# Patient Record
Sex: Male | Born: 1980 | Race: Black or African American | Hispanic: No | Marital: Single | Smoking: Current some day smoker
Health system: Southern US, Community
[De-identification: ages and names within clinical notes are randomized; demographics above are authoritative.]

## PROBLEM LIST (undated history)

## (undated) ENCOUNTER — Emergency Department (HOSPITAL_COMMUNITY): Discharge status: Absent without leave | Payer: Medicaid Other

## (undated) DIAGNOSIS — G4730 Sleep apnea, unspecified: Secondary | ICD-10-CM

## (undated) DIAGNOSIS — E119 Type 2 diabetes mellitus without complications: Secondary | ICD-10-CM

## (undated) DIAGNOSIS — J449 Chronic obstructive pulmonary disease, unspecified: Secondary | ICD-10-CM

## (undated) DIAGNOSIS — F29 Unspecified psychosis not due to a substance or known physiological condition: Secondary | ICD-10-CM

## (undated) DIAGNOSIS — S75009A Unspecified injury of femoral artery, unspecified leg, initial encounter: Secondary | ICD-10-CM

## (undated) DIAGNOSIS — J45909 Unspecified asthma, uncomplicated: Secondary | ICD-10-CM

## (undated) DIAGNOSIS — W3400XA Accidental discharge from unspecified firearms or gun, initial encounter: Secondary | ICD-10-CM

## (undated) DIAGNOSIS — Y249XXA Unspecified firearm discharge, undetermined intent, initial encounter: Secondary | ICD-10-CM

## (undated) DIAGNOSIS — F419 Anxiety disorder, unspecified: Secondary | ICD-10-CM

## (undated) DIAGNOSIS — F819 Developmental disorder of scholastic skills, unspecified: Secondary | ICD-10-CM

## (undated) DIAGNOSIS — J189 Pneumonia, unspecified organism: Secondary | ICD-10-CM

## (undated) DIAGNOSIS — F329 Major depressive disorder, single episode, unspecified: Secondary | ICD-10-CM

## (undated) DIAGNOSIS — Z8669 Personal history of other diseases of the nervous system and sense organs: Secondary | ICD-10-CM

## (undated) DIAGNOSIS — F32A Depression, unspecified: Secondary | ICD-10-CM

## (undated) HISTORY — DX: Type 2 diabetes mellitus without complications: E11.9

## (undated) HISTORY — DX: Developmental disorder of scholastic skills, unspecified: F81.9

## (undated) HISTORY — DX: Morbid (severe) obesity due to excess calories: E66.01

## (undated) HISTORY — DX: Unspecified psychosis not due to a substance or known physiological condition: F29

## (undated) HISTORY — DX: Personal history of other diseases of the nervous system and sense organs: Z86.69

---
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
## 2005-12-18 ENCOUNTER — Emergency Department (HOSPITAL_COMMUNITY): Admission: EM | Admit: 2005-12-18 | Discharge: 2005-12-18 | Payer: Self-pay | Admitting: Emergency Medicine

## 2007-08-14 ENCOUNTER — Encounter: Payer: Self-pay | Admitting: Family Medicine

## 2007-08-22 DIAGNOSIS — F819 Developmental disorder of scholastic skills, unspecified: Secondary | ICD-10-CM

## 2007-08-22 HISTORY — DX: Developmental disorder of scholastic skills, unspecified: F81.9

## 2007-10-23 DIAGNOSIS — F29 Unspecified psychosis not due to a substance or known physiological condition: Secondary | ICD-10-CM

## 2007-10-23 HISTORY — DX: Unspecified psychosis not due to a substance or known physiological condition: F29

## 2007-11-15 ENCOUNTER — Encounter: Payer: Self-pay | Admitting: Family Medicine

## 2008-04-22 ENCOUNTER — Encounter: Payer: Self-pay | Admitting: Family Medicine

## 2008-09-08 ENCOUNTER — Ambulatory Visit: Payer: Self-pay | Admitting: Family Medicine

## 2008-09-08 DIAGNOSIS — F29 Unspecified psychosis not due to a substance or known physiological condition: Secondary | ICD-10-CM | POA: Insufficient documentation

## 2008-09-08 DIAGNOSIS — J45991 Cough variant asthma: Secondary | ICD-10-CM | POA: Insufficient documentation

## 2008-09-08 DIAGNOSIS — G4730 Sleep apnea, unspecified: Secondary | ICD-10-CM | POA: Insufficient documentation

## 2008-09-08 DIAGNOSIS — R5381 Other malaise: Secondary | ICD-10-CM | POA: Insufficient documentation

## 2008-09-08 DIAGNOSIS — R5383 Other fatigue: Secondary | ICD-10-CM

## 2008-09-08 DIAGNOSIS — Z6841 Body Mass Index (BMI) 40.0 and over, adult: Secondary | ICD-10-CM

## 2008-09-08 LAB — CONVERTED CEMR LAB
Chlamydia, DNA Probe: NEGATIVE
GC Probe Amp, Genital: NEGATIVE

## 2008-09-09 ENCOUNTER — Telehealth: Payer: Self-pay | Admitting: Family Medicine

## 2008-09-11 LAB — CONVERTED CEMR LAB
BUN: 15 mg/dL (ref 6–23)
Basophils Absolute: 0 10*3/uL (ref 0.0–0.1)
Basophils Relative: 1 % (ref 0–1)
CO2: 20 meq/L (ref 19–32)
Calcium: 9.1 mg/dL (ref 8.4–10.5)
Chloride: 105 meq/L (ref 96–112)
Cholesterol: 241 mg/dL — ABNORMAL HIGH (ref 0–200)
Creatinine, Ser: 0.88 mg/dL (ref 0.40–1.50)
Eosinophils Absolute: 0.4 10*3/uL (ref 0.0–0.7)
Eosinophils Relative: 8 % — ABNORMAL HIGH (ref 0–5)
Glucose, Bld: 86 mg/dL (ref 70–99)
HCT: 40.9 % (ref 39.0–52.0)
HDL: 32 mg/dL — ABNORMAL LOW (ref >39)
Hemoglobin: 14.2 g/dL (ref 13.0–17.0)
LDL Cholesterol: 152 mg/dL — ABNORMAL HIGH (ref 0–99)
Lymphocytes Relative: 47 % — ABNORMAL HIGH (ref 12–46)
Lymphs Abs: 2.3 10*3/uL (ref 0.7–4.0)
MCHC: 34.7 g/dL (ref 30.0–36.0)
MCV: 87.4 fL (ref 78.0–100.0)
Monocytes Absolute: 0.5 10*3/uL (ref 0.1–1.0)
Monocytes Relative: 11 % (ref 3–12)
Neutro Abs: 1.6 10*3/uL — ABNORMAL LOW (ref 1.7–7.7)
Neutrophils Relative %: 34 % — ABNORMAL LOW (ref 43–77)
PSA: 0.64 ng/mL (ref 0.10–4.00)
Platelets: 329 10*3/uL (ref 150–400)
Potassium: 4 meq/L (ref 3.5–5.3)
RBC: 4.68 M/uL (ref 4.22–5.81)
RDW: 15 % (ref 11.5–15.5)
Sodium: 143 meq/L (ref 135–145)
TSH: 1.937 microintl units/mL (ref 0.350–4.500)
Total CHOL/HDL Ratio: 7.5
Triglycerides: 283 mg/dL — ABNORMAL HIGH (ref <150)
VLDL: 57 mg/dL — ABNORMAL HIGH (ref 0–40)
WBC: 4.8 10*3/uL (ref 4.0–10.5)

## 2008-09-21 DIAGNOSIS — F17200 Nicotine dependence, unspecified, uncomplicated: Secondary | ICD-10-CM | POA: Insufficient documentation

## 2008-09-22 ENCOUNTER — Encounter: Payer: Self-pay | Admitting: Family Medicine

## 2008-09-25 ENCOUNTER — Encounter: Payer: Self-pay | Admitting: Family Medicine

## 2008-09-25 ENCOUNTER — Ambulatory Visit (HOSPITAL_COMMUNITY): Admission: RE | Admit: 2008-09-25 | Discharge: 2008-09-25 | Payer: Self-pay | Admitting: Family Medicine

## 2008-09-30 ENCOUNTER — Encounter: Payer: Self-pay | Admitting: Family Medicine

## 2008-10-02 ENCOUNTER — Encounter: Payer: Self-pay | Admitting: Family Medicine

## 2008-10-06 ENCOUNTER — Telehealth: Payer: Self-pay | Admitting: Family Medicine

## 2008-12-31 ENCOUNTER — Ambulatory Visit: Payer: Self-pay | Admitting: Family Medicine

## 2008-12-31 DIAGNOSIS — H547 Unspecified visual loss: Secondary | ICD-10-CM | POA: Insufficient documentation

## 2009-01-28 ENCOUNTER — Encounter: Payer: Self-pay | Admitting: Family Medicine

## 2009-01-30 ENCOUNTER — Telehealth: Payer: Self-pay | Admitting: Family Medicine

## 2009-03-17 ENCOUNTER — Telehealth: Payer: Self-pay | Admitting: Family Medicine

## 2009-03-23 ENCOUNTER — Telehealth: Payer: Self-pay | Admitting: Family Medicine

## 2009-09-16 ENCOUNTER — Ambulatory Visit: Payer: Self-pay | Admitting: Family Medicine

## 2009-09-16 DIAGNOSIS — R110 Nausea: Secondary | ICD-10-CM | POA: Insufficient documentation

## 2009-09-16 DIAGNOSIS — R109 Unspecified abdominal pain: Secondary | ICD-10-CM | POA: Insufficient documentation

## 2009-10-21 ENCOUNTER — Encounter: Payer: Self-pay | Admitting: Family Medicine

## 2009-11-30 ENCOUNTER — Ambulatory Visit: Payer: Self-pay | Admitting: Family Medicine

## 2009-12-01 ENCOUNTER — Encounter: Payer: Self-pay | Admitting: Family Medicine

## 2009-12-01 ENCOUNTER — Telehealth: Payer: Self-pay | Admitting: Family Medicine

## 2009-12-02 LAB — CONVERTED CEMR LAB
Chlamydia, DNA Probe: NEGATIVE
GC Probe Amp, Genital: NEGATIVE

## 2009-12-03 ENCOUNTER — Encounter: Payer: Self-pay | Admitting: Family Medicine

## 2010-01-06 ENCOUNTER — Telehealth: Payer: Self-pay | Admitting: Family Medicine

## 2010-01-19 ENCOUNTER — Ambulatory Visit: Payer: Self-pay | Admitting: Family Medicine

## 2010-02-04 ENCOUNTER — Ambulatory Visit: Payer: Self-pay | Admitting: Family Medicine

## 2010-03-02 ENCOUNTER — Telehealth: Payer: Self-pay | Admitting: Family Medicine

## 2010-03-03 ENCOUNTER — Ambulatory Visit
Admission: RE | Admit: 2010-03-03 | Discharge: 2010-03-03 | Payer: Self-pay | Source: Home / Self Care | Attending: Family Medicine | Admitting: Family Medicine

## 2010-03-23 NOTE — Letter (Signed)
Summary: 1st missed letter  1st missed letter   Imported By: Lind Guest 01/20/2010 07:48:13  _____________________________________________________________________  External Attachment:    Type:   Image     Comment:   External Document

## 2010-03-23 NOTE — Miscellaneous (Signed)
  Clinical Lists Changes  Medications: Added new medication of SYMBICORT 80-4.5 MCG/ACT AERO (BUDESONIDE-FORMOTEROL FUMARATE) 2 puffs twice daily - Signed Added new medication of PROVENTIL HFA 108 (90 BASE) MCG/ACT AERS (ALBUTEROL SULFATE) 2 puffs every 6 to 8 hours as needed for wheezing - Signed Rx of SYMBICORT 80-4.5 MCG/ACT AERO (BUDESONIDE-FORMOTEROL FUMARATE) 2 puffs twice daily;  #1 x 3;  Signed;  Entered by: Syliva Overman MD;  Authorized by: Syliva Overman MD;  Method used: Electronically to [facility] [address], [place], [place], [place]  [address], Ph: [phone], Fax: [phone] Rx of PROVENTIL HFA 108 (90 BASE) MCG/ACT AERS (ALBUTEROL SULFATE) 2 puffs every 6 to 8 hours as needed for wheezing;  #1 x 2;  Signed;  Entered by: Syliva Overman MD;  Authorized by: Syliva Overman MD;  Method used: Electronically to [facility] [address], [place], [place], [place]  [address], Ph: [phone], Fax: [phone]    Prescriptions: PROVENTIL HFA 108 (90 BASE) MCG/ACT AERS (ALBUTEROL SULFATE) 2 puffs every 6 to 8 hours as needed for wheezing  #1 x 2   Entered and Authorized by:   Syliva Overman MD   Signed by:   Syliva Overman MD on 10/02/2008   Method used:   Electronically to        [facility]  [address] (retail)       [address]       [place], [place]  [address]       Ph: [phone]       Fax: [phone]   RxID:   [id number] SYMBICORT 80-4.5 MCG/ACT AERO (BUDESONIDE-FORMOTEROL FUMARATE) 2 puffs twice daily  #1 x 3   Entered and Authorized by:   Syliva Overman MD   Signed by:   Syliva Overman MD on 10/02/2008   Method used:   Electronically to        [facility]  [address] (retail)       [address]       [place], [place]  [address]       Ph: [phone]       Fax: [phone]   RxID:   [id number]

## 2010-03-23 NOTE — Progress Notes (Signed)
Summary: rx  Phone Note Call from Patient   Summary of Call: needs to get a rx called in.  metronidazol 500mg   [phone] Initial call taken by: Rudene Anda,  March 17, 2009 3:42 PM  Follow-up for Phone Call        patient needs appt, no antibiotic without ov Follow-up by: Worthy Keeler LPN,  March 17, 2009 3:45 PM  Additional Follow-up for Phone Call Additional follow up Details #1::        pt was called and notified  Additional Follow-up by: Rudene Anda,  March 17, 2009 3:58 PM

## 2010-03-23 NOTE — Progress Notes (Signed)
  Phone Note Call from Patient   Caller: Mom Summary of Call: patient's mother states he should have been tested for trich and no test was done for that. Initial call taken by: Worthy Keeler LPN,  September 09, 2008 4:29 PM  Follow-up for Phone Call        aqdvise he was treated for trich, and the specimen was sent for the other 2 which we normally do, she did see he hadpossible trich exposure.Nothing else to do.  ALSO in error you ordered a pSA on this pt, pLS be mORE careful, pSA testing starts at age 75 Follow-up by: Syliva Overman MD,  September 09, 2008 6:12 PM  Additional Follow-up for Phone Call Additional follow up Details #1::        mother aware Additional Follow-up by: Worthy Keeler LPN,  September 11, 2008 12:11 PM

## 2010-03-23 NOTE — Progress Notes (Signed)
Summary: needs med list  Phone Note Call from Patient   Summary of Call: wednesday he [facility] to [facility] and needs to show that he has asthma  spray  and  a list of his medicine call back at [phone] needs asap needs to pick up tomorrow Initial call taken by: Lind Guest,  March 23, 2009 3:54 PM  Follow-up for Phone Call        med list available for pick up, mother aware Follow-up by: Worthy Keeler LPN,  March 23, 2009 4:51 PM

## 2010-03-23 NOTE — Assessment & Plan Note (Signed)
Summary: NEW PATIENT   Vital Signs:  Patient profile:   30 year old male Height:      65 inches Weight:      201 pounds BMI:     33.57 O2 Sat:      96 % Pulse rate:   94 / minute Pulse rhythm:   regular Resp:     16 per minute BP sitting:   120 / 82  (left arm) Cuff size:   large  Vitals Entered By: Everitt Amber (September 08, 2008 1:11 PM)  Nutrition Counseling: Patient's BMI is greater than 25 and therefore counseled on weight management options. CC: New patient   CC:  New patient.  History of Present Illness: This is  a new pt eval for this pt with a learning disability and mental health issues  which make him disabled. He is accompanied by his mother. he was recently advised that his partener had trichomonas and he wants to be tested for STD's.He denies urethral discharge. He denies dusuria or frequency, he has had no fever or chills. The pt smokes 1 PPD and drinks alcohol occasionally. He does not exercise, and is overweight. He is not very motivated at this time to make lifestyle changes to improve his general health.He is unwilling to set a quit date for smoking, but is considering decreasing his smoking. He denies head or chest congestion. He denies abdominal pain or probs with bowel movements. He has mild seasonal allergies. Hec/o fatigue and excessive fatigue and daytime sleepiness. He also c/o cough and shortness of breath, and wants to be checked for asthma.  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    Learning disability x childhood  Past Surgical History: Denies surgical history  Family History: Mother-Living-unknown Father-Living-HTN 1 sister-healthy  Social History: Disabled Single Current Smoker-1 pack Alcohol use-yes  1 to 2 beer per week Regular exercise-no Three children age range 1.5 yrs to 7 yrs. Smoking Status:  current Does Patient Exercise:  no  Review of Systems General:  Complains of fatigue and sleep disorder; denies chills and fever; excessive snoring and breath holding. Eyes:  Denies blurring and discharge. ENT:  Denies hoarseness, nasal congestion, sinus pressure, and sore throat. CV:  Denies chest pain or discomfort, difficulty breathing while lying down, palpitations, and swelling of feet. Resp:  Complains of shortness of breath and wheezing; denies cough and sputum productive; wheezing ia approx once per month. GI:  Denies abdominal pain, constipation, diarrhea, nausea, and vomiting. GU:  See HPI; recently told he awas exposed to trichomonas, has had clamydias in the past. MS:  Denies joint pain and stiffness. Derm:  Denies itching and rash. Neuro:  Denies headaches and poor balance. Psych:  Denies anxiety and depression. Endo:  Denies excessive thirst and excessive urination. Heme:  Denies abnormal bruising and bleeding. Allergy:  Complains of seasonal allergies.  Physical Exam  General:  alert, well-hydrated, and overweight-appearing. HEENT: No facial asymmetry,  EOMI, No sinus tenderness, TM's Clear, oropharynx  pink and moist.   Chest: Clear to auscultation bilaterally.  CVS: S1, S2, No murmurs, No S3.   Abd: Soft, Nontender.  MS: Adequate ROM spine, hips, shoulders and knees.  Ext: No edema.   CNS: CN 2-12 intact, power tone and sensation normal throughout.   Skin: Intact, no visible lesions or rashes.  Psych: Good eye contact,  normal affect.  Memory intact, not anxious or depressed appearing. 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 09/11/2008  Problem # 3:  OBESITY, UNSPECIFIED (ICD-278.00) Assessment: Comment Only  Orders: T-Lipid Profile ([id number])  Ht: 65 (09/08/2008)   Wt: 201 (09/08/2008)   BMI: 33.57 (09/08/2008), therapeutic lifestyle change discussed and encouraged  Problem # 4:  SEXUALLY TRANSMITTED DISEASE, EXPOSURE TO (ICD-V01.6) Assessment: Comment Only  Orders: T-Syphilis Test (RPR) [id number]) T-HIV Antibody  (Reflex) [id number]) T-Chlamydia & GC Probe, Genital ([id number])  Problem # 5:  UNSPECIFIED PSYCHOSIS (ICD-298.9) Assessment: Comment Only pt is followed by mental health  Problem # 6:  NICOTINE ADDICTION (ICD-305.1) Assessment: Comment Only  Encouraged smoking cessation and discussed different methods for smoking cessation.   Complete Medication List: 1)  Metronidazole 500 Mg Tabs (Metronidazole) .... Take 1 tablet by mouth two times a day  Other Orders: T-PSA ([id number]) Sleep Disorder Referral (Sleep Disorder)  Patient Instructions: 1)  CPE in 2.5 months. 2)  Tobacco is very bad for your health and your loved ones! You Should stop smoking!. 3)  Stop Smoking Tips: Choose a Quit date. Cut down before the Quit date. decide what you will do as a substitute when you feel the urge to smoke(gum,toothpick,exercise). 4)  It is important that you exercise regularly at least 20 minutes 6 times a week. If you develop chest pain, have severe difficulty breathing, or feel very tired , stop exercising  immediately and seek medical attention. 5)  You need to lose weight. Consider a lower calorie diet and regular exercise. 6)  You will be referred for testing for asthma and also for sleep apnea. 7)  BMP prior to visit, ICD-9: 8)  Lipid Panel prior to visit, ICD-9:   fasting asap 9)  TSH prior to visit, ICD-9: 10)  CBC w/ Diff prior to visit, ICD-9: 11)  PSA prior to visit, ICD-9: 12)  hIV and RPR, STD exposure 13)  If you could be exposed to sexually transmitted diseases, you should use a condom. Prescriptions: METRONIDAZOLE 500 MG TABS (METRONIDAZOLE) Take 1 tablet by mouth two times a day  #14 x 0   Entered and Authorized by:   Syliva Overman MD   Signed by:   Syliva Overman MD on 09/08/2008   Method used:   Electronically to        [facility]  [address] (retail)       [address]       [place], [place]  [address]       Ph: [phone]       Fax: [phone]   RxID:   [id number]

## 2010-03-23 NOTE — Assessment & Plan Note (Signed)
Summary: new pt              pt not evaluated in the office, he is currently not an established pt here

## 2010-03-23 NOTE — Progress Notes (Signed)
  Phone Note From Pharmacy   Caller: [facility]  [address] [phone]* Summary of Call: advair requires pa  Initial call taken by: Worthy Keeler LPN,  January 30, 2009 3:59 PM  Follow-up for Phone Call        PLS SEE IF THEY WILL COVER SYMBICORT 80/4.5 2 PUFFS TWICE DAILY AND ERX #1 WITH 2 REFILLS PLS Follow-up by: Syliva Overman MD,  February 01, 2009 11:30 PM  Additional Follow-up for Phone Call Additional follow up Details #1::        we tried symbicort first and they do not cover this either Additional Follow-up by: Worthy Keeler LPN,  February 02, 2009 10:17 AM    Additional Follow-up for Phone Call Additional follow up Details #2::    I sent in spiriva since he has chronic nicotineuse, hopefully thiswill work with his proventil Follow-up by: Syliva Overman MD,  February 02, 2009 12:32 PM  New/Updated Medications: SPIRIVA HANDIHALER 18 MCG CAPS (TIOTROPIUM BROMIDE MONOHYDRATE) one inhalation daily Prescriptions: SPIRIVA HANDIHALER 18 MCG CAPS (TIOTROPIUM BROMIDE MONOHYDRATE) one inhalation daily  #1 x 3   Entered and Authorized by:   Syliva Overman MD   Signed by:   Syliva Overman MD on 02/02/2009   Method used:   Electronically to        [facility]  [address] (retail)       [address]       [place], [place]  [address]       Ph: [phone]       Fax: [phone]   RxID:   [id number]

## 2010-03-23 NOTE — Progress Notes (Signed)
Summary: medicine  Phone Note Call from Patient   Summary of Call: needs something for a bowel movement [facility] call back at [phone] Initial call taken by: Lind Guest,  January 06, 2010 9:46 AM  Follow-up for Phone Call        advised to try dulcolax otc Follow-up by: Adella Hare LPN,  January 06, 2010 1:18 PM

## 2010-03-23 NOTE — Letter (Signed)
Summary: MEDICAL RELEASE INFO  MEDICAL RELEASE INFO   Imported By: Lind Guest 12/03/2009 14:24:55  _____________________________________________________________________  External Attachment:    Type:   Image     Comment:   External Document

## 2010-03-23 NOTE — Assessment & Plan Note (Signed)
Summary: [facility]   Vital Signs:  Patient profile:   30 year old male Height:      65 inches Weight:      213.56 pounds BMI:     35.67 O2 Sat:      98 % on Room air Pulse rate:   101 / minute Pulse rhythm:   regular Resp:     16 per minute BP sitting:   120 / 80  (left arm)  Vitals Entered By: Adella Hare LPN (November 30, 2009 3:30 PM)  Nutrition Counseling: Patient's BMI is greater than 25 and therefore counseled on weight management options.  O2 Flow:  Room air CC: follow-up visit Is Patient Diabetic? No Pain Assessment Patient in pain? no      Comments did not bring meds to [facility]   CC:  follow-up visit.  History of Present Illness: Pt treated in the ed 8/31 with chlamydia, needs re-eval, he denies any current penile d/c , or dysuria. [place] is being followed by psychiatry , he recently started, mother notes mild improvement in his irritability, and plans to have him continue wiith mental health. Reports  that the is doing well. Denies recent fever or chills. Denies sinus pressure, nasal congestion , ear pain or sore throat. Denies chest congestion, or cough productive of sputum. Denies chest pain, palpitations, PND, orthopnea or leg swelling. Denies abdominal pain, nausea, vomitting, diarrhea or constipation. Denies change in bowel movements or bloody stool. Denies dysuria , frequency, incontinence or hesitancy. Denies  joint pain, swelling, or reduced mobility. Denies headaches, vertigo, seizures.  Denies  rash, lesions, or itch.      Allergies (verified): No Known Drug Allergies  Review of Systems      See HPI General:  Complains of fatigue. Eyes:  Denies discharge, eye pain, and red eye. Resp:  Complains of shortness of breath and wheezing; currently controlled, and asymptomatic. Psych:  Complains of mental problems. Endo:  Denies cold intolerance, excessive thirst, excessive urination, and heat intolerance. Heme:  Denies abnormal bruising and  bleeding. Allergy:  Complains of seasonal allergies.  Physical Exam  General:  Well-developed,obese,in no acute distress; alert,appropriate and cooperative throughout examination HEENT: No facial asymmetry,  EOMI, No sinus tenderness, TM's Clear, oropharynx  pink and moist. Neck short and thick, oropharynx is crowded  Chest: Clear to auscultation bilaterally. Decreasedair entry bilaterally CVS: S1, S2, No murmurs, No S3.   Abd: Soft, Nontender.  MS: Adequate ROM spine, hips, shoulders and knees.  Ext: No edema.   CNS: CN 2-12 intact, power tone and sensation normal throughout.   Skin: Intact, no visible lesions or rashes.  Psych: Good eye contact, flat affect.  Memory impairedt, not anxious or depressed appearing. GU:no penile ilcer, no penile d/c visible    Impression & Recommendations:  Problem # 1:  NICOTINE ADDICTION (ICD-305.1) Assessment Unchanged  Encouraged smoking cessation and discussed different methods for smoking cessation.   Problem # 2:  COUGH VARIANT ASTHMA (ICD-493.82) Assessment: Improved  The following medications were removed from the medication list:    Spiriva Handihaler 18 Mcg Caps (Tiotropium bromide monohydrate) ..... One inhalation daily His updated medication list for this problem includes:    Proventil Hfa 108 (90 Base) Mcg/act Aers (Albuterol sulfate) .[name]... 2 puffs every 6 to 8 hours as needed for wheezing  Problem # 3:  SEXUALLY TRANSMITTED DISEASE, EXPOSURE TO (ICD-V01.6) Assessment: Comment Only speciment sent for GC and chlamydia  Problem # 4:  UNSPECIFIED PSYCHOSIS (ICD-298.9) Assessment: Improved pt to  continue with mental health  Complete Medication List: 1)  Proventil Hfa 108 (90 Base) Mcg/act Aers (Albuterol sulfate) .... 2 puffs every 6 to 8 hours as needed for wheezing 2)  Zyprexa 10 Mg Tabs (Olanzapine) .... One at bedtime , uncertain of dose, med not at [facility] and from mental health  Other Orders: T-HIV Antibody  (Reflex)  [id number]) T-Syphilis Test (RPR) [id number]) T-Chlamydia & GC Probe, Genital ([id number])  Patient Instructions: 1)  CPE in 4 monyths. 2)  If you could be exposed to sexually transmitted diseases, you should use a condom. 3)  HIV and RPR to be added to labs, pls order. 4)  Flu vac today. 5)  It is important that you exercise regularly at least 40 minutes 5 times a week. If you develop chest pain, have severe difficulty breathing, or feel very tired , stop exercising immediately and seek medical attention. 6)  You need to lose weight. Consider a lower calorie diet and regular exercise.  Prescriptions: PROVENTIL HFA 108 (90 BASE) MCG/ACT AERS (ALBUTEROL SULFATE) 2 puffs every 6 to 8 hours as needed for wheezing  #1 x 3   Entered by:   Adella Hare LPN   Authorized by:   Syliva Overman MD   Signed by:   Adella Hare LPN on 29/56/2130   Method used:   Electronically to        [facility]  [address] (retail)       [address]       [place], [place]  [address]       Ph: [phone]       Fax: [phone]   RxID:   [id number]

## 2010-03-23 NOTE — Progress Notes (Signed)
Summary: [facility]   Imported By: Lind Guest 09/30/2008 11:05:51  _____________________________________________________________________  External Attachment:    Type:   Image     Comment:   External Document

## 2010-03-23 NOTE — Letter (Signed)
Summary: med review  med review   Imported By: Rudene Anda 12/01/2009 08:41:02  _____________________________________________________________________  External Attachment:    Type:   Image     Comment:   External Document

## 2010-03-23 NOTE — Miscellaneous (Signed)
Summary: med change  Clinical Lists Changes  Medications: Removed medication of SYMBICORT 80-4.5 MCG/ACT AERO (BUDESONIDE-FORMOTEROL FUMARATE) 2 puffs twice daily Added new medication of ADVAIR DISKUS 100-50 MCG/DOSE AEPB (FLUTICASONE-SALMETEROL) one puff two times a day - Signed Rx of ADVAIR DISKUS 100-50 MCG/DOSE AEPB (FLUTICASONE-SALMETEROL) one puff two times a day;  #1 x 2;  Signed;  Entered by: Worthy Keeler LPN;  Authorized by: Syliva Overman MD;  Method used: Electronically to [facility] [place] [facility], [place], [place], [place]  [address], Ph: [phone], Fax: [phone]    Prescriptions: ADVAIR DISKUS 100-50 MCG/DOSE AEPB (FLUTICASONE-SALMETEROL) one puff two times a day  #1 x 2   Entered by:   Worthy Keeler LPN   Authorized by:   Syliva Overman MD   Signed by:   Worthy Keeler LPN on 13/09/6576   Method used:   Electronically to        [facility]  [address] (retail)       [address]       [place], [place]  [address]       Ph: [phone]       Fax: [phone]   RxID:   [id number]

## 2010-03-23 NOTE — Progress Notes (Signed)
Summary: MEDS  Phone Note Call from Patient   Summary of Call: CALL [facility] ABOUT MEDICINE Initial call taken by: Lind Guest,  December 01, 2009 4:20 PM  Follow-up for Phone Call        spoke with patients mother, advised no results available Follow-up by: Adella Hare LPN,  December 01, 2009 5:07 PM

## 2010-03-23 NOTE — Letter (Signed)
Summary: reported home medicine [facility]  reported home medicine [facility]   Imported By: Lind Guest 12/01/2009 08:53:51  _____________________________________________________________________  External Attachment:    Type:   Image     Comment:   External Document

## 2010-03-23 NOTE — Progress Notes (Signed)
  Phone Note Call from Patient   Summary of Call: Patient was apparently bit on stomach (doesn't know by what) and its a red circle around it. Sore but not itching. Described it as the size of a 45 record. Told her he needed to be evaluated at the urgent care or ER. Initial call taken by: Everitt Amber,  October 06, 2008 8:22 AM

## 2010-03-23 NOTE — Assessment & Plan Note (Signed)
Summary: office visit   Vital Signs:  Patient profile:   30 year old male Height:      65 inches Weight:      204.50 pounds BMI:     34.15 O2 Sat:      98 % Pulse rate:   76 / minute Pulse rhythm:   regular Resp:     16 per minute BP sitting:   130 / 80  (left arm) Cuff size:   large  Vitals Entered By: Everitt Amber (December 31, 2008 1:19 PM)  Nutrition Counseling: Patient's BMI is greater than 25 and therefore counseled on weight management options. CC: CPE Is Patient Diabetic? No Pain Assessment Patient in pain? no       Vision Screening:Left eye w/o correction: 20 / 40 Right Eye w/o correction: 20 / 40 Both eyes w/o correction:  20/ 30  Color vision testing: normal      Vision Entered By: Everitt Amber (December 31, 2008 1:26 PM)   CC:  CPE.  History of Present Illness: Reports  that he has been  doing well. Denies recent fever or chills. Denies sinus pressure, nasal congestion , ear pain or sore throat. Denies chest congestion, or cough productive of sputum. Denies chest pain, palpitations, PND, orthopnea or leg swelling. Denies abdominal pain, nausea, vomitting, diarrhea or constipation. Denies change in bowel movements or bloody stool. Denies dysuria , frequency, incontinence or hesitancy. Denies  joint pain, swelling, or reduced mobility. Denies headaches, vertigo, seizures. Denies depression, anxiety or insomnia. Denies  rash, lesions, or itch.     Current Medications (verified): 1)  Symbicort 80-4.5 Mcg/act Aero (Budesonide-Formoterol Fumarate) .... 2 Puffs Twice Daily 2)  Proventil Hfa 108 (90 Base) Mcg/act Aers (Albuterol Sulfate) .... 2 Puffs Every 6 To 8 Hours As Needed For Wheezing  Allergies (verified): No Known Drug Allergies  Review of Systems      See HPI Eyes:  Denies blurring, discharge, and double vision. Neuro:  Denies headaches, seizures, and sensation of room spinning. Endo:  Denies cold intolerance, excessive hunger, excessive  thirst, excessive urination, heat intolerance, polyuria, and weight change. Heme:  Denies abnormal bruising and bleeding. Allergy:  Complains of seasonal allergies; denies hives or rash and itching eyes.  Physical Exam  General:  Well-developed,well-nourished,in no acute distress; alert,appropriate and cooperative throughout examination Head:  Normocephalic and atraumatic without obvious abnormalities. No apparent alopecia or balding. Eyes:  pupils equal, pupils round, and pupils reactive to light.  reduced vision bilaterally Ears:  External ear exam shows no significant lesions or deformities.  Otoscopic examination reveals clear canals, tympanic membranes are intact bilaterally without bulging, retraction, inflammation or discharge. Hearing is grossly normal bilaterally. Nose:  External nasal examination shows no deformity or inflammation. Nasal mucosa are pink and moist without lesions or exudates. Mouth:  pharynx pink and moist and fair dentition.   Neck:  No deformities, masses, or tenderness noted. Chest Wall:  No deformities, masses, tenderness or gynecomastia noted. Breasts:  No masses or gynecomastia noted Lungs:  Normal respiratory effort, chest expands symmetrically. Lungs are clear to auscultation, no crackles or wheezes. Heart:  Normal rate and regular rhythm. S1 and S2 normal without gallop, murmur, click, rub or other extra sounds. Abdomen:  Bowel sounds positive,abdomen soft and non-tender without masses, organomegaly or hernias noted. Rectal:  deferred Genitalia:  done ar a previous visit AND IS NORMAL Msk:  No deformity or scoliosis noted of thoracic or lumbar spine.   Pulses:  R and L carotid,radial,femoral,dorsalis  pedis and posterior tibial pulses are full and equal bilaterally Extremities:  No clubbing, cyanosis, edema, or deformity noted with normal full range of motion of all joints.   Neurologic:  No cranial nerve deficits noted. Station and gait are normal. Plantar  reflexes are down-going bilaterally. DTRs are symmetrical throughout. Sensory, motor and coordinative functions appear intact. Skin:  Intact without suspicious lesions or rashes Cervical Nodes:  No lymphadenopathy noted Axillary Nodes:  No palpable lymphadenopathy Inguinal Nodes:  No significant adenopathy Psych:  Oriented X3 and flat affect.  MILD MENTAL RETARDATION.   Impression & Recommendations:  Problem # 1:  UNSPECIFIED VISUAL LOSS (ICD-369.9) Assessment Comment Only  Orders: Ophthalmology Referral (Ophthalmology)  Problem # 2:  NICOTINE ADDICTION (ICD-305.1) Assessment: Unchanged  Encouraged smoking cessation and discussed different methods for smoking cessation.   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 2 puffs twice daily    Proventil Hfa 108 (90 Base) Mcg/act Aers (Albuterol sulfate) .[name]... 2 puffs every 6 to 8 hours as needed for wheezing  Complete Medication List: 1)  Symbicort 80-4.5 Mcg/act Aero (Budesonide-formoterol fumarate) .... 2 puffs twice daily 2)  Proventil Hfa 108 (90 Base) Mcg/act Aers (Albuterol sulfate) .... 2 puffs every 6 to 8 hours as needed for wheezing  Other Orders: Influenza Vaccine NON MCR (09811)  Patient Instructions: 1)  cPE in 4.5 months 2)  It is important that you exercise regularly at least 20 minutes 5 times a week. If you develop chest pain, have severe difficulty breathing, or feel very tired , stop exercising immediately and seek medical attention. 3)  You need to lose weight. Consider a lower calorie diet and regular  exercise.  4)  Lipid Panel prior to visit, ICD-9:  fasting lipid in 4.5 months 5)  you will be referred for an eye exam, we will call with the appt   Immunizations Administered:  Influenza Vaccine # 1:    Vaccine Type: Fluvax Non-MCR    Site: right deltoid    Mfr: novartis    Dose: 0.5 ml    Route: IM    Given by: Worthy Keeler LPN    Exp. Date: 04/11    Lot #: [id number]    VIS given: 09/14/06 version given December 31, 2008.

## 2010-03-23 NOTE — Assessment & Plan Note (Signed)
Summary: F UP   Vital Signs:  Patient profile:   30 year old male Height:      65 inches Weight:      212.25 pounds BMI:     35.45 O2 Sat:      97 % Pulse rate:   99 / minute Pulse rhythm:   regular Resp:     16 per minute BP sitting:   120 / 82  (right arm) Cuff size:   large  Vitals Entered By: Everitt Amber LPN (September 16, 2009 4:11 PM)  Nutrition Counseling: Patient's BMI is greater than 25 and therefore counseled on weight management options. CC: Follow up chronic problems   CC:  Follow up chronic problems.  History of Present Illness: Reports  that he has been doing fairly well. He styates that he is now redy to get his sleep study, suince he knows that he needs it a he has sleep apnea.He reports excessive snoring AND DAYTIME SLEEPINESS. Denies recent fever or chills. Denies sinus pressure, nasal congestion , ear pain or sore throat. Denies chest congestion, or cough productive of sputum.No recent acute exaccerbations of asthma Denies chest pain, palpitations, PND, orthopnea or leg swelling.  Denies change in bowel movements or bloody stool. Denies dysuria , frequency, incontinence or hesitancy. Denies  joint pain, swelling, or reduced mobility. Denies headaches, vertigo, seizures. Denies depression, anxiety or insomnia. Denies  rash, lesions, or itch. No lifestyle change to facilitate weight loss, and pt does not appear to be highly motivated at this time.     Preventive Screening-Counseling & Management  Alcohol-Tobacco     Smoking Cessation Counseling: yes  Current Medications (verified): 1)  Proventil Hfa 108 (90 Base) Mcg/act Aers (Albuterol Sulfate) .... 2 Puffs Every 6 To 8 Hours As Needed For Wheezing 2)  Spiriva Handihaler 18 Mcg Caps (Tiotropium Bromide Monohydrate) .... One Inhalation Daily  Allergies (verified): No Known Drug Allergies  Review of Systems      See HPI General:  Complains of fatigue and sleep disorder. Eyes:  Denies blurring and  discharge. GI:  Complains of abdominal pain, gas, indigestion, and nausea; denies constipation, diarrhea, and vomiting; worsening symptoms in the pst 3 months. Endo:  Denies cold intolerance, excessive hunger, excessive thirst, and heat intolerance. Heme:  Denies abnormal bruising and bleeding. Allergy:  Denies hives or rash and itching eyes.  Physical Exam  General:  Well-developed,obese,in no acute distress; alert,appropriate and cooperative throughout examination HEENT: No facial asymmetry,  EOMI, No sinus tenderness, TM's Clear, oropharynx  pink and moist. Neck short and thick, oropharynx is crowded  Chest: Clear to auscultation bilaterally. Decreasedair entry bilaterally CVS: S1, S2, No murmurs, No S3.   Abd: Soft, Nontender.  MS: Adequate ROM spine, hips, shoulders and knees.  Ext: No edema.   CNS: CN 2-12 intact, power tone and sensation normal throughout.   Skin: Intact, no visible lesions or rashes.  Psych: Good eye contact, flat affect.  Memory impairedt, not anxious or depressed appearing.    Impression & Recommendations:  Problem # 1:  NICOTINE ADDICTION (ICD-305.1) Assessment Unchanged  Encouraged smoking cessation and discussed different methods for smoking cessation.   Problem # 2:  OBESITY, UNSPECIFIED (ICD-278.00) Assessment: Deteriorated  Ht: 65 (09/16/2009)   Wt: 212.25 (09/16/2009)   BMI: 35.45 (09/16/2009)  Problem # 3:  SLEEP APNEA (ICD-780.57) Assessment: Deteriorated sleep study to be rescheduled  Problem # 4:  ABDOMINAL PAIN OTHER SPECIFIED SITE (ICD-789.09) Assessment: Deteriorated  Orders: Radiology Referral (Radiology)  Complete  Medication List: 1)  Proventil Hfa 108 (90 Base) Mcg/act Aers (Albuterol sulfate) .... 2 puffs every 6 to 8 hours as needed for wheezing 2)  Spiriva Handihaler 18 Mcg Caps (Tiotropium bromide monohydrate) .... One inhalation daily  Other Orders: T-Basic Metabolic Panel [phone]) T-Lipid Profile  [id number]) T-CBC w/Diff [phone] [id number]) T-TSH [id number]) Tdap => 56yrs IM (28413) Admin 1st Vaccine (24401) Admin 1st Vaccine [facility]) [phone])  Patient Instructions: 1)  Please schedule a follow-up appointment in 3 months. 2)  BMP prior to visit, ICD-9: 3)  Lipid Panel prior to visit, ICD-9:  fasyting asap 4)  TSH prior to visit, ICD-9: 5)  CBC w/ Diff prior to visit, ICD-9 6)  You absolutely need to get the sleep study we referred you for. 7)  I will refer you for gallbladder testing : 8)  Tobacco is very bad for your health and your loved ones! You Should stop smoking!. 9)  Stop Smoking Tips: Choose a Quit date. Cut down before the Quit date. decide what you will do as a substitute when you feel the urge to smoke(gum,toothpick,exercise). 10)  It is important that you exercise regularly at least 20 minutes 5 times a week. If you develop chest pain, have severe difficulty breathing, or feel very tired , stop exercising immediately and seek medical attention. 11)  You need to lose weight. Consider a lower calorie diet and regular exercise.  Prescriptions: SPIRIVA HANDIHALER 18 MCG CAPS (TIOTROPIUM BROMIDE MONOHYDRATE) one inhalation daily  #1 x 3   Entered by:   Everitt Amber LPN   Authorized by:   Syliva Overman MD   Signed by:   Everitt Amber LPN on 66/44/0347   Method used:   Electronically to        [facility]  [address] (retail)       [address]       [place], [place]  [address]       Ph: [phone]       Fax: [phone]   RxID:   [id number] PROVENTIL HFA 108 (90 BASE) MCG/ACT AERS (ALBUTEROL SULFATE) 2 puffs every 6 to 8 hours as needed for wheezing  #1 x 3   Entered by:   Everitt Amber LPN   Authorized by:   Syliva Overman MD   Signed by:   Everitt Amber LPN on 22/03/5425   Method used:   Electronically to        [facility]  [address] (retail)       [address]       [place], [place]  [address]       Ph: [phone]       Fax:  [phone]   RxID:   [id number]    Tetanus/Td Vaccine    Vaccine Type: Tdap    Site: left deltoid    Mfr: boosterix    Dose: 0.5 ml    Route: IM    Given by: Everitt Amber LPN    Exp. Date: 05/16/2011    Lot #: [id number]

## 2010-03-23 NOTE — Letter (Signed)
Summary: TREATMENT PLAN DIAGNOSIS  TREATMENT PLAN DIAGNOSIS   Imported By: Lind Guest 09/09/2008 08:56:07  _____________________________________________________________________  External Attachment:    Type:   Image     Comment:   External Document

## 2010-03-23 NOTE — Letter (Signed)
Summary: MED LIST / [facility] Flock  MED LIST / [facility]   Imported By: Lind Guest 09/09/2008 08:56:59  _____________________________________________________________________  External Attachment:    Type:   Image     Comment:   External Document

## 2010-03-25 NOTE — Assessment & Plan Note (Signed)
Summary: office visit   Vital Signs:  Patient profile:   30 year old male Height:      65 inches Weight:      216 pounds BMI:     36.07 O2 Sat:      96 % Pulse rate:   94 / minute Pulse rhythm:   regular Resp:     16 per minute BP sitting:   130 / 84  (left arm) Cuff size:   xl  Vitals Entered By: Everitt Amber LPN (March 03, 2010 1:14 PM)  Nutrition Counseling: Patient's BMI is greater than 25 and therefore counseled on weight management options. CC: Follow up chronic problems   CC:  Follow up chronic problems.  History of Present Illness: Pt in becauseof recentr concern that his BP is high. Of note he ahs had nl pressures here , and he has missed sebveral of his scheduled appts. He denies headache, dizziness, blurred vision. he stil;l smokes , styates he wioll try to cut back. Otherwise no concerns today.  Preventive Screening-Counseling & Management  Alcohol-Tobacco     Smoking Cessation Counseling: yes  Current Medications (verified): 1)  Proventil Hfa 108 (90 Base) Mcg/act Aers (Albuterol Sulfate) .... 2 Puffs Every 6 To 8 Hours As Needed For Wheezing 2)  Zyprexa 10 Mg Tabs (Olanzapine) .... One At Bedtime , Uncertain of Dose, Med Not At Ov and From Mental Health  Allergies (verified): No Known Drug Allergies  Review of Systems      See HPI General:  Complains of fatigue and sleep disorder; pt to reschedule his sleep study. Eyes:  Denies discharge and red eye. ENT:  Denies nasal congestion and sinus pressure. CV:  Denies chest pain or discomfort, lightheadness, near fainting, palpitations, shortness of breath with exertion, and swelling of feet. Resp:  Denies coughing up blood and sputum productive. GI:  Denies abdominal pain, constipation, diarrhea, nausea, and vomiting. GU:  Denies dysuria, incontinence, nocturia, urinary frequency, and urinary hesitancy. MS:  Denies joint pain and stiffness. Allergy:  Denies hives or rash and itching eyes.  Physical  Exam  General:  Well-developed,opbese,in no acute distress; alert,appropriate and cooperative throughout examination HEENT: No facial asymmetry,  EOMI, No sinus tenderness, TM's Clear, oropharynx  pink and moist.   Chest: Clear to auscultation bilaterally. Decreased air entry bilaterally CVS: S1, S2, No murmurs, No S3.   Abd: Soft, Nontender.  MS: Adequate ROM spine, hips, shoulders and knees.  Ext: No edema.   CNS: CN 2-12 intact, power tone and sensation normal throughout.   Skin: Intact, no visible lesions or rashes.  Psych: Good eye contact, flat affect.  Memory loss, not anxious or depressed appearing.mild MR     Impression & Recommendations:  Problem # 1:  NICOTINE ADDICTION (ICD-305.1) Assessment Unchanged  Encouraged smoking cessation and discussed different methods for smoking cessation.   Problem # 2:  COUGH VARIANT ASTHMA (ICD-493.82) Assessment: Unchanged  His updated medication list for this problem includes:    Proventil Hfa 108 (90 Base) Mcg/act Aers (Albuterol sulfate) .[name]... 2 puffs every 6 to 8 hours as needed for wheezing  Problem # 3:  UNSPECIFIED PSYCHOSIS (ICD-298.9) Assessment: Comment Only controlled on meds and followed by psych  Problem # 4:  SLEEP APNEA (ICD-780.57) Assessment: Comment Only pt to resched test  Complete Medication List: 1)  Proventil Hfa 108 (90 Base) Mcg/act Aers (Albuterol sulfate) .... 2 puffs every 6 to 8 hours as needed for wheezing 2)  Zyprexa 10 Mg Tabs (Olanzapine) .Marland KitchenMarland KitchenMarland Kitchen  One at bedtime , uncertain of dose, med not at ov and from mental health  Other Orders: T-Basic Metabolic Panel [id number]) T-Lipid Profile [id number]) T-CBC w/Diff [id number]) T-TSH [id number])  Patient Instructions: 1)  Please schedule a follow-up appointment in 4 months. 2)  It is important that you exercise regularly at least 20 minutes 5 times a week. If you develop chest pain, have severe difficulty breathing, or feel very tired , stop  exercising immediately and seek medical attention. 3)  You need to lose weight. Consider a lower calorie diet and regular exercise.  4)  Tobacco is very bad for your health and your loved ones! You Should stop smoking!. 5)  Stop Smoking Tips: Choose a Quit date. Cut down before the Quit date. decide what you will do as a substitute when you feel the urge to smoke(gum,toothpick,exercise). 6)  BMP prior to visit, ICD-9: 7)  Lipid Panel prior to visit, ICD-9:  fasting labs asap 8)  TSH prior to visit, ICD-9: 9)  CBC w/ Diff prior to visit, ICD-9: 10)  follow the dash diet you will receive and also a lower cal diet , we will give you an 1800 cal diet sheet to follow. 11)  You do not have high blood pressure, but your s bP is higher than it should be. 12)  pls follow a low fat diet, we will provide you with this, your cholesterol is high  13)  pls call and resched your sleep study   Orders Added: 1)  Est. Patient Level IV [99214] 2)  T-Basic Metabolic Panel [[id number]] 3)  T-Lipid Profile [[id number]] 4)  T-CBC w/Diff [[id number]] 5)  T-TSH [[id number]]

## 2010-03-25 NOTE — Progress Notes (Signed)
Summary: please advise blood pressure  Phone Note Call from Patient   Caller: Patient Summary of Call: 146/96 Yesterday at [facility].  They told patient he needed to see his primary care doctor and that was subject to a stroke.  Mrs. Dewan was really wanting him seen today and said most doctors will work in patients.  I explained to her that we didn't have any avaliable slots even the work in slots were full.  Please advise. Initial call taken by: Curtis Sites,  March 02, 2010 8:41 AM  Follow-up for Phone Call        appt in the morning Follow-up by: Syliva Overman MD,  March 02, 2010 5:05 PM  Additional Follow-up for Phone Call Additional follow up Details #1::        mother had already called back and scheduled an evening appt Additional Follow-up by: Adella Hare LPN,  March 02, 2010 5:11 PM

## 2010-04-12 ENCOUNTER — Encounter: Payer: Self-pay | Admitting: Family Medicine

## 2010-04-29 ENCOUNTER — Encounter: Payer: Self-pay | Admitting: Family Medicine

## 2010-06-10 ENCOUNTER — Encounter: Payer: Self-pay | Admitting: Family Medicine

## 2010-07-06 NOTE — Procedures (Signed)
Shawn Ramsey, Shawn Ramsey             ACCOUNT NO.:  [id number]   MEDICAL RECORD NO.:  [id number]          PATIENT TYPE:  OUT   LOCATION:  RESP                          FACILITY:  [facility]   PHYSICIAN:  Edward L. Juanetta Gosling, M.D.DATE OF BIRTH:  [birth year]   DATE OF PROCEDURE:  09/25/2008  DATE OF DISCHARGE:  09/25/2008                            PULMONARY FUNCTION TEST   1. Spirometry shows a mild ventilatory defect with evidence of airflow      obstruction most marked in the smaller airways.  2. Lung volumes show borderline reduction.  3. DLCO is borderline reduced.  Some of the changes in the DLCO and      lung capacity may be related to body habitus noting the patient's      height and weight.  This study is consistent with the clinical      diagnosis of asthma.      Edward L. Juanetta Gosling, M.D.  Electronically Signed     [facility]/MEDQ  D:  09/26/2008  T:  09/26/2008  Job:  [id number]   cc:   Milus Mallick. Lodema Hong, M.D.  Fax: [phone]

## 2010-07-15 ENCOUNTER — Encounter: Payer: Self-pay | Admitting: Family Medicine

## 2010-07-20 ENCOUNTER — Encounter: Payer: Self-pay | Admitting: Family Medicine

## 2010-07-26 ENCOUNTER — Encounter: Payer: Self-pay | Admitting: Family Medicine

## 2010-07-27 ENCOUNTER — Ambulatory Visit: Payer: Self-pay | Admitting: Family Medicine

## 2010-09-06 ENCOUNTER — Encounter: Payer: Self-pay | Admitting: Family Medicine

## 2010-09-14 ENCOUNTER — Encounter: Payer: Self-pay | Admitting: Family Medicine

## 2010-09-14 ENCOUNTER — Ambulatory Visit (INDEPENDENT_AMBULATORY_CARE_PROVIDER_SITE_OTHER): Payer: Medicaid Other | Admitting: Family Medicine

## 2010-09-14 VITALS — BP 110/78 | HR 105 | Resp 16 | Ht 64.75 in | Wt 223.1 lb

## 2010-09-14 DIAGNOSIS — R5383 Other fatigue: Secondary | ICD-10-CM

## 2010-09-14 DIAGNOSIS — G4730 Sleep apnea, unspecified: Secondary | ICD-10-CM

## 2010-09-14 DIAGNOSIS — Z7251 High risk heterosexual behavior: Secondary | ICD-10-CM

## 2010-09-14 DIAGNOSIS — F29 Unspecified psychosis not due to a substance or known physiological condition: Secondary | ICD-10-CM

## 2010-09-14 DIAGNOSIS — R5381 Other malaise: Secondary | ICD-10-CM

## 2010-09-14 DIAGNOSIS — Z113 Encounter for screening for infections with a predominantly sexual mode of transmission: Secondary | ICD-10-CM

## 2010-09-14 DIAGNOSIS — F17200 Nicotine dependence, unspecified, uncomplicated: Secondary | ICD-10-CM

## 2010-09-14 DIAGNOSIS — J45991 Cough variant asthma: Secondary | ICD-10-CM

## 2010-09-14 DIAGNOSIS — E669 Obesity, unspecified: Secondary | ICD-10-CM

## 2010-09-14 DIAGNOSIS — Z13220 Encounter for screening for lipoid disorders: Secondary | ICD-10-CM

## 2010-09-14 MED ORDER — BUDESONIDE-FORMOTEROL FUMARATE 80-4.5 MCG/ACT IN AERO: 2.0000 | INHALATION_SPRAY | Freq: Two times a day (BID) | RESPIRATORY_TRACT | Status: DC

## 2010-09-14 NOTE — Patient Instructions (Signed)
F/U in 4 months.  I will refer you for a sleep study again, it is vital you keep the appt  Cbc, chem 7, tsh, random cholesterol, rpr and hiv test today   Pls start daily inhaler for your asthma since yoou are wheezing often  It is important that you exercise regularly at least 30 minutes 5 times a week. If you develop chest pain, have severe difficulty breathing, or feel very tired, stop exercising immediately and seek medical attention  A healthy diet is rich in fruit, vegetables and whole grains. Poultry fish, nuts and beans are a healthy choice for protein rather then red meat. A low sodium diet and drinking 64 ounces of water daily is generally recommended. Oils and sweet should be limited. Carbohydrates especially for those who are diabetic or overweight, should be limited to 34-45 gram per meal. It is important to eat on a regular schedule, at least 3 times daily. Snacks should be primarily fruits, vegetables or nuts.

## 2010-09-15 ENCOUNTER — Telehealth: Payer: Self-pay | Admitting: Family Medicine

## 2010-09-15 LAB — CBC WITH DIFFERENTIAL/PLATELET
Basophils Absolute: 0 10*3/uL (ref 0.0–0.1)
Basophils Relative: 0 % (ref 0–1)
Eosinophils Absolute: 0.2 10*3/uL (ref 0.0–0.7)
Eosinophils Relative: 4 % (ref 0–5)
HCT: 41.7 % (ref 39.0–52.0)
Hemoglobin: 14.3 g/dL (ref 13.0–17.0)
Lymphocytes Relative: 40 % (ref 12–46)
Lymphs Abs: 2.3 10*3/uL (ref 0.7–4.0)
MCH: 30.2 pg (ref 26.0–34.0)
MCHC: 34.3 g/dL (ref 30.0–36.0)
MCV: 88 fL (ref 78.0–100.0)
Monocytes Absolute: 0.5 10*3/uL (ref 0.1–1.0)
Monocytes Relative: 9 % (ref 3–12)
Neutro Abs: 2.8 10*3/uL (ref 1.7–7.7)
Neutrophils Relative %: 47 % (ref 43–77)
Platelets: 339 10*3/uL (ref 150–400)
RBC: 4.74 MIL/uL (ref 4.22–5.81)
RDW: 14.6 % (ref 11.5–15.5)
WBC: 5.8 10*3/uL (ref 4.0–10.5)

## 2010-09-15 LAB — RPR

## 2010-09-15 LAB — BASIC METABOLIC PANEL
BUN: 18 mg/dL (ref 6–23)
CO2: 27 mEq/L (ref 19–32)
Calcium: 9.7 mg/dL (ref 8.4–10.5)
Chloride: 104 mEq/L (ref 96–112)
Creat: 0.77 mg/dL (ref 0.50–1.35)
Glucose, Bld: 84 mg/dL (ref 70–99)
Potassium: 4 mEq/L (ref 3.5–5.3)
Sodium: 141 mEq/L (ref 135–145)

## 2010-09-15 LAB — LIPID PANEL
Cholesterol: 215 mg/dL — ABNORMAL HIGH (ref 0–200)
HDL: 33 mg/dL — ABNORMAL LOW (ref >39)
LDL Cholesterol: 153 mg/dL — ABNORMAL HIGH (ref 0–99)
Total CHOL/HDL Ratio: 6.5 Ratio
Triglycerides: 143 mg/dL (ref <150)
VLDL: 29 mg/dL (ref 0–40)

## 2010-09-15 LAB — TSH: TSH: 1.6 u[IU]/mL (ref 0.350–4.500)

## 2010-09-15 LAB — HIV ANTIBODY (ROUTINE TESTING W REFLEX): HIV: NONREACTIVE

## 2010-09-15 NOTE — Telephone Encounter (Signed)
error 

## 2010-09-21 ENCOUNTER — Encounter: Payer: Self-pay | Admitting: Family Medicine

## 2010-10-04 ENCOUNTER — Encounter: Payer: Self-pay | Admitting: Family Medicine

## 2010-10-04 DIAGNOSIS — Z7251 High risk heterosexual behavior: Secondary | ICD-10-CM | POA: Insufficient documentation

## 2010-10-04 NOTE — Assessment & Plan Note (Signed)
Still needs evaluation and symptoms of snoring and excessive daytime sleepiness are worsening

## 2010-10-04 NOTE — Assessment & Plan Note (Signed)
Deteriorated. Patient re-educated about  the importance of commitment to a  minimum of 150 minutes of exercise per week. The importance of healthy food choices with portion control discussed. Encouraged to start a food diary, count calories and to consider  joining a support group. Sample diet sheets offered. Goals set by the patient for the next several months.    

## 2010-10-04 NOTE — Progress Notes (Signed)
  Subjective:    Patient ID: Shawn Ramsey, male    DOB: [birth year], 30 y.o.   MRN: [id number]  HPI The PT is here for follow up and re-evaluation of chronic medical conditions, medication management and review of any available recent lab and radiology data.  Preventive health is updated, specifically  Cancer screening and Immunization.   Questions or concerns regarding consultations or procedures which the PT has had in the interim are  addressed. The PT denies any adverse reactions to current medications since the last visit.  Recently treated in the ED for STD, continues to have several partners and use condoms intermittently. Mother accompanies son, who is mentally impaired. She remains concerned that he may have sleep apnea, based on symptoms, however he still has not kept a previous referral appt.States she will see that he goes   Review of Systems See HPI Denies recent fever or chills.c/o fatigue, excessive daytime drowsiness and excessive snoring Denies sinus pressure, nasal congestion, ear pain or sore throat. Denies chest congestion or  productive cough c/o increased wheezing.Non compliant with preventive inhaler Denies chest pains, palpitations and leg swelling Denies abdominal pain, nausea, vomiting,diarrhea or constipation.   Denies dysuria, frequency, hesitancy or incontinence. Denies joint pain, swelling and limitation in mobility. Occasional headaches,denies  seizures, numbness, or tingling. Denies depression, anxiety or insomnia.On medication from psychiatry Denies skin break down or rash.        Objective:   Physical Exam Patient alert and  in no cardiopulmonary distress.  HEENT: No facial asymmetry, EOMI, no sinus tenderness,  oropharynx pink and moist.  Neck is thick and supple no adenopathy.  Chest:Decreased air entry with scattered wheezes  CVS: S1, S2 no murmurs, no S3.  ABD: Soft non tender. Bowel sounds normal.  Ext: No edema  MS: Adequate ROM  spine, shoulders, hips and knees.  Skin: Intact, no ulcerations or rash noted.  Psych: Good eye contact,flat affect.  not anxious or depressed appearing.  CNS: CN 2-12 intact, power, tone and sensation normal throughout.MIld mental retardation        Assessment & Plan:

## 2010-10-04 NOTE — Assessment & Plan Note (Addendum)
UNControlled, pt educated about the need to use a preventive inhaler daily , to reduce flares and symptom of recurrent wheezing

## 2010-10-04 NOTE — Assessment & Plan Note (Signed)
Counseled re the importance of condom use all the time as well as restricting the number of partners he has at any given time. Swabs from the ED were checked and were negative for gc or clamydia, hiv and rpr testing still ordered based on history

## 2010-10-04 NOTE — Assessment & Plan Note (Signed)
unchanged

## 2010-10-04 NOTE — Assessment & Plan Note (Signed)
Controlled, no change in medication Followed by mental health 

## 2010-11-08 ENCOUNTER — Telehealth: Payer: Self-pay | Admitting: Family Medicine

## 2010-11-08 NOTE — Telephone Encounter (Signed)
PATIENT CHANGED MIND

## 2010-12-03 ENCOUNTER — Encounter: Payer: Self-pay | Admitting: Family Medicine

## 2011-02-21 ENCOUNTER — Encounter: Payer: Self-pay | Admitting: Family Medicine

## 2011-02-25 ENCOUNTER — Encounter: Payer: Self-pay | Admitting: Family Medicine

## 2011-02-28 ENCOUNTER — Encounter: Payer: Self-pay | Admitting: Family Medicine

## 2011-02-28 ENCOUNTER — Ambulatory Visit (INDEPENDENT_AMBULATORY_CARE_PROVIDER_SITE_OTHER): Payer: Medicaid Other | Admitting: Family Medicine

## 2011-02-28 VITALS — BP 130/90 | HR 82 | Resp 14 | Ht 65.0 in | Wt 236.8 lb

## 2011-02-28 DIAGNOSIS — J45991 Cough variant asthma: Secondary | ICD-10-CM

## 2011-02-28 DIAGNOSIS — F17200 Nicotine dependence, unspecified, uncomplicated: Secondary | ICD-10-CM

## 2011-02-28 DIAGNOSIS — J45909 Unspecified asthma, uncomplicated: Secondary | ICD-10-CM

## 2011-02-28 DIAGNOSIS — F29 Unspecified psychosis not due to a substance or known physiological condition: Secondary | ICD-10-CM

## 2011-02-28 DIAGNOSIS — E785 Hyperlipidemia, unspecified: Secondary | ICD-10-CM

## 2011-02-28 DIAGNOSIS — E669 Obesity, unspecified: Secondary | ICD-10-CM

## 2011-02-28 NOTE — Assessment & Plan Note (Signed)
Deteriorated. Patient re-educated about  the importance of commitment to a  minimum of 150 minutes of exercise per week. The importance of healthy food choices with portion control discussed. Encouraged to start a food diary, count calories and to consider  joining a support group. Sample diet sheets offered. Goals set by the patient for the next several months.    

## 2011-02-28 NOTE — Assessment & Plan Note (Signed)
Unchanged, cessation counseling done , no commitment to quitting at this time 

## 2011-02-28 NOTE — Patient Instructions (Addendum)
F/u in 5 months   You need to stop sweets , sweet drinks, cut down on potatoes , rice , bread, pasta, pizza. Reduce fried and fatty foods.  You ned to stop smoking , please cut back and also set a quit date , this is dangerous for your health. It is important that you exercise regularly at least 30 minutes 5 times a week. If you develop chest pain, have severe difficulty breathing, or feel very tired, stop exercising immediately and seek medical attention    Weight has increased by 15 pounds, you need to lose this in the next 5 months please.  TWO sprays for asthma need to be used every day , and the albuterol as needed  You need to get your flu shot at the pharmacy

## 2011-02-28 NOTE — Progress Notes (Signed)
  Subjective:    Patient ID: Shawn Ramsey, male    DOB: 1980/04/11, 31 y.o.   MRN: [id number]  HPI The PT is here for follow up and re-evaluation of chronic medical conditions, medication management and review of any available recent lab and radiology data.  Preventive health is updated, specifically  Cancer screening and Immunization.   Questions or concerns regarding consultations or procedures which the PT has had in the interim are  addressed. The PT denies any adverse reactions to current medications since the last visit.  There are no new concerns.  There are no specific complaints  C/o increased shortness of breath and wheezing, tough still smoking and only using his albuterol. Also c/o weight gain, no modification of diet and no regular exercise    Review of Systems See HPI Denies recent fever or chills. Denies sinus pressure, nasal congestion, ear pain or sore throat. Denies chest congestion, productive cough Denies chest pains, palpitations and leg swelling Denies abdominal pain, nausea, vomiting,diarrhea or constipation.   Denies dysuria, frequency, hesitancy or incontinence. Denies joint pain, swelling and limitation in mobility. Denies headaches, seizures, numbness, or tingling. Denies depression, anxiety or insomnia.Reports auditory hallucinations which are non threatening Denies skin break down or rash.        Objective:   Physical Exam Patient alert and oriented and in no cardiopulmonary distress.  HEENT: No facial asymmetry, EOMI, no sinus tenderness,  oropharynx pink and moist.  Neck supple no adenopathy.  Chest: decreased air entry, weezes no cracles  CVS: S1, S2 no murmurs, no S3.  ABD: Soft non tender. Bowel sounds normal.  Ext: No edema  MS: Adequate ROM spine, shoulders, hips and knees.  Skin: Intact, no ulcerations or rash noted.  Psych: Good eye contact, l flat affect.  Impaired memory and mentally challenged, not anxious or depressed  appearing.  CNS: CN 2-12 intact, power, tone and sensation normal throughout.        Assessment & Plan:

## 2011-02-28 NOTE — Assessment & Plan Note (Signed)
Uncontrolled, need s to use symbicort daily, as prescribed

## 2011-02-28 NOTE — Assessment & Plan Note (Signed)
Followed by psych, reports recent med change

## 2011-06-03 ENCOUNTER — Emergency Department (HOSPITAL_COMMUNITY)
Admission: EM | Admit: 2011-06-03 | Discharge: 2011-06-03 | Disposition: A | Discharge status: Home / Self Care | Payer: Medicaid Other | Attending: Emergency Medicine | Admitting: Emergency Medicine

## 2011-06-03 ENCOUNTER — Encounter (HOSPITAL_COMMUNITY): Payer: Self-pay | Admitting: Emergency Medicine

## 2011-06-03 DIAGNOSIS — Z113 Encounter for screening for infections with a predominantly sexual mode of transmission: Secondary | ICD-10-CM | POA: Insufficient documentation

## 2011-06-03 DIAGNOSIS — Z202 Contact with and (suspected) exposure to infections with a predominantly sexual mode of transmission: Secondary | ICD-10-CM

## 2011-06-03 MED ORDER — AZITHROMYCIN 250 MG PO TABS
1000.0000 mg | ORAL_TABLET | Freq: Once | ORAL | Status: AC
  Administered 2011-06-03: 1000 mg via ORAL
  Filled 2011-06-03: qty 2
  Filled 2011-06-03: qty 4

## 2011-06-03 MED ORDER — CEFTRIAXONE SODIUM 250 MG IJ SOLR
250.0000 mg | Freq: Once | INTRAMUSCULAR | Status: AC
  Administered 2011-06-03: 250 mg via INTRAMUSCULAR
  Filled 2011-06-03 (×2): qty 250

## 2011-06-03 NOTE — ED Notes (Signed)
Pt was with a male that has herpes of the mouth and wants to make sure that he does not have the same.

## 2011-06-03 NOTE — ED Provider Notes (Signed)
History     CSN: [id number]  Arrival date & time 06/03/11  1711   First MD Initiated Contact with Patient 06/03/11 1821      Chief Complaint  Patient presents with  . SEXUALLY TRANSMITTED DISEASE    (Consider location/radiation/quality/duration/timing/severity/associated sxs/prior treatment) HPI Comments: Pt had recent intercourse.  His partner had a cold sore and performed oral sex and then vaginal sex.  He is concerned that he may acquire genital herpes.  No condom use.  He has not developed any perioral or genital lesions and has no flu-like prodrome.  No prior STD's.  The history is provided by the patient. No language interpreter was used.    Past Medical History  Diagnosis Date  . 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 Hallucinations, visual     auditory and visual   . Mental health disorder 08/2007    mental health and learning disability grounds , he has never had a full time job and did not complete high school , his math and reading skills are limited .  [name] Learning disability childhood    History reviewed. No pertinent past surgical history.  Family History  Problem Relation Age of Onset  . Hypertension Father     History  Substance Use Topics  . Smoking status: Current Everyday Smoker -- 0.5 packs/day    Types: Cigarettes  . Smokeless tobacco: Not on file  . Alcohol Use: Yes      Review of Systems  Constitutional: Negative for fever.  Genitourinary: Negative for dysuria, urgency, frequency, hematuria, discharge, genital sores and penile pain.  All other systems reviewed and are negative.    Allergies  Review of patient's allergies indicates no known allergies.  Home Medications   Current Outpatient Rx  Name Route Sig Dispense Refill  . ALBUTEROL 90 MCG/ACT IN AERS Inhalation Inhale 2 puffs into the lungs every 6 (six) hours as needed. 2 puffs every 6 to 8 hours as needed for  wheezing       Ht 5\' 5"  (1.651 m)  Wt 223 lb (101.152 kg)  BMI 37.11 kg/m2  Physical Exam  Nursing note and vitals reviewed. Constitutional: He is oriented to person, place, and time. He appears well-developed and well-nourished.  HENT:  Head: Normocephalic and atraumatic.  Mouth/Throat: Uvula is midline and mucous membranes are normal. No uvula swelling. No oropharyngeal exudate, posterior oropharyngeal edema, posterior oropharyngeal erythema or tonsillar abscesses.       No perioral lesions  Eyes: EOM are normal.  Neck: Normal range of motion.  Cardiovascular: Normal rate, regular rhythm, normal heart sounds and intact distal pulses.   Pulmonary/Chest: Effort normal and breath sounds normal. No respiratory distress.  Abdominal: Soft. He exhibits no distension. There is no tenderness.  Genitourinary: Testes normal and penis normal. Right testis shows no swelling and no tenderness. Right testis is descended. Left testis shows no swelling and no tenderness. Left testis is descended. Circumcised. No phimosis, hypospadias, penile erythema or penile tenderness. No discharge found.       No visible skin lesions in groin area.  No urethral d/c  Musculoskeletal: Normal range of motion.  Neurological: He is alert and oriented to person, place, and time.  Skin: Skin is warm and dry.  Psychiatric: He has a normal mood and affect. Judgment normal.    ED Course  Procedures (including critical care time)   Labs Reviewed  GC/CHLAMYDIA PROBE AMP, GENITAL  RPR   No results found.   No diagnosis found.    MDM  Rocephin 250 mg IM zithromax 1 gm po Safe sex        Worthy Rancher, [place] 06/03/11 [phone]

## 2011-06-03 NOTE — ED Notes (Signed)
Pt presents with concern of contracting an STD from a partner. Pt states was told by sexual partner that they had herpes and another possible std. Pt concerned for his health and safety came to ed to have proper screening conducted.

## 2011-06-03 NOTE — Discharge Instructions (Signed)
Safer Sex Your caregiver wants you to have this information about the infections that can be transmitted from sexual contact and how to prevent them. The idea behind safer sex is that you can be sexually active, and at the same time reduce the risk of giving or getting a sexually transmitted disease (STD). Every person should be aware of how to prevent him or herself and his or her sex partner from getting an STD. CAUSES OF STDS STDs are transmitted by sharing body fluids, which contain viruses and bacteria. The following fluids all transmit infections during sexual intercourse and sex acts:  Semen.   Saliva.   Urine.   Blood.   Vaginal mucus.  Examples of STDs include:  Chlamydia.   Gonorrhea.   Genital herpes.   Hepatitis B.   Human immunodeficiency virus or acquired immunodeficiency syndrome (HIV or AIDS).   Syphilis.   Trichomonas.   Pubic lice.   Human papillomavirus (HPV), which may include:   Genital warts.   Cervical dysplasia.   Cervical cancer (can develop with certain types of HPV).  SYMPTOMS  Sexual diseases often cause few or no symptoms until they are advanced, so a person can be infected and spread the infection without knowing it. Some STDs respond to treatment very well. Others, like HIV and herpes, cannot be cured, but are treated to reduce their effects. Specific symptoms include:  Abnormal vaginal discharge.   Irritation or itching in and around the vagina, and in the pubic hair.   Pain during sexual intercourse.   Bleeding during sexual intercourse.   Pelvic or abdominal pain.   Fever.   Growths in and around the vagina.   An ulcer in or around the vagina.   Swollen glands in the groin area.  DIAGNOSIS   Blood tests.   Pap test.   Culture test of abnormal vaginal discharge.   A test that applies a solution and examines the cervix with a lighted magnifying scope (colposcopy).   A test that examines the pelvis with a lighted  tube, through a small incision (laparoscopy).  TREATMENT  The treatment will depend on the cause of the STD.  Antibiotic treatment by injection, oral, creams, or suppositories in the vagina.   Over-the-counter medicated shampoo, to get rid of pubic lice.   Removing or treating growths with medicine, freezing, burning (electrocautery), or surgery.   Surgery treatment for HPV of the cervix.   Supportive medicines for herpes, HIV, AIDS, and hepatitis.  Being careful cannot eliminate all risk of infection, but sex can be made much safer. Safe sexual practices include body massage and gentle touching. Masturbation is safe, as long as body fluids do not contact skin that has sores or cuts. Dry kissing and oral sex on a man wearing a latex condom or on a woman wearing a male condom is also safe. Slightly less safe is intercourse while the man wears a latex condom or wet kissing. It is also safer to have one sex partner that you know is not having sex with anyone else. LENGTH OF ILLNESS An STD might be treated and cured in a week, sometimes a month, or more. And it can linger with symptoms for many years. STDs can also cause damage to the male organs. This can cause chronic pain, infertility, and recurrence of the STD, especially herpes, hepatitis, HIV, and HPV. HOME CARE INSTRUCTIONS AND PREVENTION  Alcohol and recreational drugs are often the reason given for not practicing safer sex. These substances affect  your judgment. Alcohol and recreational drugs can also impair your immune system, making you more vulnerable to disease.   Do not engage in risky and dangerous sexual practices, including:   Vaginal or anal sex without a condom.   Oral sex on a man without a condom.   Oral sex on a woman without a male condom.   Using saliva to lubricate a condom.   Any other sexual contact in which body fluids or blood from one partner contact the other partner.   You should use only latex  condoms for men and water soluble lubricants. Petroleum based lubricants or oils used to lubricate a condom will weaken the condom and increase the chance that it will break.   Think very carefully before having sex with anyone who is high risk for STDs and HIV. This includes IV drug users, people with multiple sexual partners, or people who have had an STD, or a positive hepatitis or HIV blood test.   Remember that even if your partner has had only one previous partner, their previous partner might have had multiple partners. If so, you are at high risk of being exposed to an STD. You and your sex partner should be the only sex partners with each other, with no one else involved.   A vaccine is available for hepatitis B and HPV through your caregiver or the Public Health Department. Everyone should be vaccinated with these vaccines.   Avoid risky sex practices. Sex acts that can break the skin make you more likely to get an STD.  SEEK MEDICAL CARE IF:   If you think you have an STD, even if you do not have any symptoms. Contact your caregiver for evaluation and treatment, if needed.   You think or know your sex partner has acquired an STD.   You have any of the symptoms mentioned above.  Document Released: 03/17/2004 Document Revised: 01/27/2011 Document Reviewed: 01/07/2009 [facility] Patient Information 2012 [place], [place].   You have been empirically treated for gonorrhea and chlamydia.  We will notify you if the syphilis test is positive.  Use condoms to protect yourself against possible STDs and HIV.  Follow up with your PCP as needed.

## 2011-06-04 NOTE — ED Provider Notes (Signed)
Medical screening examination/treatment/procedure(s) were performed by non-physician practitioner and as supervising physician I was immediately available for consultation/collaboration.   Shelda Jakes, MD 06/04/11 [phone]

## 2011-06-05 LAB — RPR: RPR Ser Ql: NONREACTIVE

## 2011-06-06 LAB — GC/CHLAMYDIA PROBE AMP, GENITAL
Chlamydia, DNA Probe: NEGATIVE
GC Probe Amp, Genital: NEGATIVE

## 2011-11-18 ENCOUNTER — Emergency Department (HOSPITAL_COMMUNITY)
Admission: EM | Admit: 2011-11-18 | Discharge: 2011-11-18 | Disposition: A | Discharge status: Home / Self Care | Payer: Medicaid Other | Attending: Emergency Medicine | Admitting: Emergency Medicine

## 2011-11-18 ENCOUNTER — Encounter (HOSPITAL_COMMUNITY): Payer: Self-pay | Admitting: *Deleted

## 2011-11-18 DIAGNOSIS — S39012A Strain of muscle, fascia and tendon of lower back, initial encounter: Secondary | ICD-10-CM

## 2011-11-18 DIAGNOSIS — E669 Obesity, unspecified: Secondary | ICD-10-CM | POA: Insufficient documentation

## 2011-11-18 DIAGNOSIS — J45909 Unspecified asthma, uncomplicated: Secondary | ICD-10-CM | POA: Insufficient documentation

## 2011-11-18 DIAGNOSIS — F17200 Nicotine dependence, unspecified, uncomplicated: Secondary | ICD-10-CM | POA: Insufficient documentation

## 2011-11-18 DIAGNOSIS — N342 Other urethritis: Secondary | ICD-10-CM

## 2011-11-18 DIAGNOSIS — M545 Low back pain, unspecified: Secondary | ICD-10-CM | POA: Insufficient documentation

## 2011-11-18 HISTORY — DX: Unspecified asthma, uncomplicated: J45.909

## 2011-11-18 MED ORDER — CEFTRIAXONE SODIUM 250 MG IJ SOLR
250.0000 mg | Freq: Once | INTRAMUSCULAR | Status: AC
  Administered 2011-11-18: 250 mg via INTRAMUSCULAR
  Filled 2011-11-18: qty 250

## 2011-11-18 MED ORDER — AZITHROMYCIN 250 MG PO TABS
1000.0000 mg | ORAL_TABLET | Freq: Once | ORAL | Status: AC
  Administered 2011-11-18: 1000 mg via ORAL
  Filled 2011-11-18: qty 4

## 2011-11-18 MED ORDER — LIDOCAINE HCL (PF) 1 % IJ SOLN
INTRAMUSCULAR | Status: AC
  Administered 2011-11-18: 5 mL
  Filled 2011-11-18: qty 5

## 2011-11-18 NOTE — ED Provider Notes (Signed)
History     CSN: [id number]  Arrival date & time 11/18/11  1552   First MD Initiated Contact with Patient 11/18/11 1657      Chief Complaint  Patient presents with  . Back Pain    (Consider location/radiation/quality/duration/timing/severity/associated sxs/prior treatment) HPI Patient with back pain for two months denies injury.  Pain is sharp right low back increases with movement.  Pain gone now without intervention.  Denies numbness.  Patient states he has some discharge from his penis clear but states he hasn't really seen any but feels like it is there.  Patient states he has he had intercourse last week with new partner and thinks he needs to be checked for std.  PMD is Dr. Lodema Hong. Past Medical History  Diagnosis Date  . 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 Hallucinations, visual     auditory and visual   . Mental health disorder 08/2007    mental health and learning disability grounds , he has never had a full time job and did not complete high school , his math and reading skills are limited .  [name] Learning disability childhood  . Asthma     History reviewed. No pertinent past surgical history.  Family History  Problem Relation Age of Onset  . Hypertension Father     History  Substance Use Topics  . Smoking status: Current Every Day Smoker -- 0.5 packs/day    Types: Cigarettes  . Smokeless tobacco: Not on file  . Alcohol Use: No      Review of Systems  All other systems reviewed and are negative.    Allergies  Review of patient's allergies indicates no known allergies.  Home Medications   Current Outpatient Rx  Name Route Sig Dispense Refill  . ALBUTEROL 90 MCG/ACT IN AERS Inhalation Inhale 2 puffs into the lungs every 6 (six) hours as needed. 2 puffs every 6 to 8 hours as needed for wheezing       BP 117/72  Pulse 108  Temp 98.8 F (37.1 C) (Oral)  Resp 18  Ht 5\' 5"  (1.651 m)   Wt 223 lb (101.152 kg)  BMI 37.11 kg/m2  SpO2 99%  Physical Exam  Nursing note and vitals reviewed. Constitutional: He is oriented to person, place, and time. He appears well-developed and well-nourished.       obese  HENT:  Head: Normocephalic and atraumatic.  Right Ear: External ear normal.  Left Ear: External ear normal.  Nose: Nose normal.  Mouth/Throat: Oropharynx is clear and moist.  Eyes: Conjunctivae normal and EOM are normal. Pupils are equal, round, and reactive to light.  Neck: Normal range of motion. Neck supple.  Cardiovascular: Normal rate, regular rhythm, normal heart sounds and intact distal pulses.   Pulmonary/Chest: Effort normal and breath sounds normal. No respiratory distress. He has no wheezes. He exhibits no tenderness.  Abdominal: Soft. Bowel sounds are normal. He exhibits no distension and no mass. There is no tenderness. There is no guarding.  Genitourinary: Penis normal.  Musculoskeletal: Normal range of motion.  Neurological: He is alert and oriented to person, place, and time. He has normal reflexes. He exhibits normal muscle tone. Coordination normal.       Patient ambulates without difficulty, good strength in legs, able to stand on one foot, and toes and heels.  Perineal sensation intact and no loss of bowel or bladder control.   Skin: Skin is  warm and dry.  Psychiatric: He has a normal mood and affect. His behavior is normal. Judgment and thought content normal.    ED Course  Procedures (including critical care time)  Labs Reviewed - No data to display No results found.   No diagnosis found.    MDM  Low back pain- no acute abnormalities noted and patient advised to use nsaids. Patient with back pain.  No neurological deficits and normal neuro exam.  Patient can walk but states is painful.  No loss of bowel or bladder control.  No concern for cauda equina.  No fever, night sweats, weight loss, h/o cancer, IVDU.  RICE protocol and pain  medicine indicated and discussed with patient.    Urethral discharge- urethral swabs obtained and patient treated with rocephin and zithromax.         Hilario Quarry, MD 11/18/11 [phone]

## 2011-11-18 NOTE — ED Notes (Signed)
Low back pain for 3-4 days, no injury. Penile d/c also

## 2011-11-22 LAB — GC/CHLAMYDIA PROBE AMP, GENITAL
Chlamydia, DNA Probe: POSITIVE — AB
GC Probe Amp, Genital: NEGATIVE

## 2011-11-23 ENCOUNTER — Ambulatory Visit: Payer: Medicaid Other | Admitting: Family Medicine

## 2011-11-23 NOTE — ED Notes (Signed)
+   Chlamydia Patient treated with rocephin and zihtromax -[facility] letter faxed

## 2011-11-24 ENCOUNTER — Telehealth (HOSPITAL_COMMUNITY): Payer: Self-pay | Admitting: *Deleted

## 2011-11-25 ENCOUNTER — Telehealth (HOSPITAL_COMMUNITY): Payer: Self-pay | Admitting: *Deleted

## 2011-11-25 NOTE — ED Notes (Signed)
Patient returned call .Patient informed of positive results after id'd x 2 and informed of need to notify partner to be treated.  

## 2012-01-10 ENCOUNTER — Ambulatory Visit (INDEPENDENT_AMBULATORY_CARE_PROVIDER_SITE_OTHER): Payer: Medicaid Other | Admitting: Family Medicine

## 2012-01-10 ENCOUNTER — Encounter: Payer: Self-pay | Admitting: Family Medicine

## 2012-01-10 VITALS — BP 130/80 | HR 87 | Resp 15

## 2012-01-10 DIAGNOSIS — R5381 Other malaise: Secondary | ICD-10-CM

## 2012-01-10 DIAGNOSIS — F17200 Nicotine dependence, unspecified, uncomplicated: Secondary | ICD-10-CM

## 2012-01-10 DIAGNOSIS — Z139 Encounter for screening, unspecified: Secondary | ICD-10-CM

## 2012-01-10 DIAGNOSIS — G4730 Sleep apnea, unspecified: Secondary | ICD-10-CM

## 2012-01-10 DIAGNOSIS — F29 Unspecified psychosis not due to a substance or known physiological condition: Secondary | ICD-10-CM

## 2012-01-10 DIAGNOSIS — E669 Obesity, unspecified: Secondary | ICD-10-CM

## 2012-01-10 DIAGNOSIS — J45991 Cough variant asthma: Secondary | ICD-10-CM

## 2012-01-10 DIAGNOSIS — R5383 Other fatigue: Secondary | ICD-10-CM

## 2012-01-10 MED ORDER — ALBUTEROL SULFATE HFA 108 (90 BASE) MCG/ACT IN AERS: 2.0000 | INHALATION_SPRAY | Freq: Four times a day (QID) | RESPIRATORY_TRACT | Status: DC | PRN

## 2012-01-10 NOTE — Progress Notes (Signed)
  Subjective:    Patient ID: Shawn Ramsey, male    DOB: 1980/04/24, 31 y.o.   MRN: [id number]  HPI The PT is here for follow up and re-evaluation of chronic medical conditions, medication management and review of any available recent lab and radiology data.  Preventive health is updated, specifically  Cancer screening and Immunization.   Questions or concerns regarding consultations or procedures which the PT has had in the interim are  Addressed.Continues to no show for sleep study The PT denies any adverse reactions to current medications since the last visit.   C/o worsening fatigue and has gained over 20 pounds    Review of Systems See HPI Denies recent fever or chills. Denies sinus pressure, nasal congestion, ear pain or sore throat. Denies chest congestion, productive cough or wheezing. Denies chest pains, palpitations and leg swelling Denies abdominal pain, nausea, vomiting,diarrhea or constipation.   Denies dysuria, frequency, hesitancy or incontinence. Denies joint pain, swelling and limitation in mobility. Denies headaches, seizures, numbness, or tingling. Denies depression, anxiety or insomnia. Denies skin break down or rash.        Objective:   Physical Exam  Patient alert and oriented and in no cardiopulmonary distress.  HEENT: No facial asymmetry, EOMI, no sinus tenderness,  oropharynx pink and moist.  Neck supple no adenopathy.  Chest: Clear to auscultation bilaterally.  CVS: S1, S2 no murmurs, no S3.  ABD: Soft non tender. Bowel sounds normal.  Ext: No edema  MS: Adequate ROM spine, shoulders, hips and knees.  Skin: Intact, no ulcerations or rash noted.  Psych: Good eye contact, flat affect.  not anxious or depressed appearing.Mild MR  CNS: CN 2-12 intact, power, tone and sensation normal throughout.       Assessment & Plan:

## 2012-01-10 NOTE — Patient Instructions (Addendum)
F/U in 4 month  You need to call and reschedule your sleep study, you NEED this, we will provide the number   Fasting CBc, lipid, chem 7 HBA1C and TSH forst week in December please.  You have gained 48 pounds in 3 years and 25 pounds in 2 monhts. Please cut out cake and sweets, eat more fruit and veg, drink water and walk daily, you NEED to lose weight

## 2012-01-13 ENCOUNTER — Encounter: Payer: Self-pay | Admitting: Family Medicine

## 2012-01-13 NOTE — Assessment & Plan Note (Signed)
Worsened with weight gain and still has not had sleep study

## 2012-01-13 NOTE — Assessment & Plan Note (Signed)
Controlled, no change in medication Needs flu vaccine

## 2012-01-13 NOTE — Assessment & Plan Note (Signed)
Deteriorated. Patient re-educated about  the importance of commitment to a  minimum of 150 minutes of exercise per week. The importance of healthy food choices with portion control discussed. Encouraged to start a food diary, count calories and to consider  joining a support group. Sample diet sheets offered. Goals set by the patient for the next several months.    

## 2012-01-13 NOTE — Assessment & Plan Note (Signed)
Unchanged. Patient counseled for approximately 5 minutes regarding the health risks of ongoing nicotine use, specifically all types of cancer, heart disease, stroke and respiratory failure. The options available for help with cessation ,the behavioral changes to assist the process, and the option to either gradully reduce usage  Or abruptly stop.is also discussed. Pt is also encouraged to set specific goals in number of cigarettes used daily, as well as to set a quit date.  

## 2012-01-13 NOTE — Assessment & Plan Note (Signed)
Controlled and followed by mental health 

## 2012-01-13 NOTE — Assessment & Plan Note (Signed)
Reports worsening symptoms, has not showed for multiple previously scheduled studies, pt and his mother understand the need to reschedule and keep appt themselves, the study has already been authorized

## 2012-03-24 ENCOUNTER — Emergency Department (HOSPITAL_COMMUNITY)
Admission: EM | Admit: 2012-03-24 | Discharge: 2012-03-24 | Disposition: A | Discharge status: Home / Self Care | Payer: Medicaid Other | Attending: Emergency Medicine | Admitting: Emergency Medicine

## 2012-03-24 ENCOUNTER — Encounter (HOSPITAL_COMMUNITY): Payer: Self-pay | Admitting: Emergency Medicine

## 2012-03-24 DIAGNOSIS — Z113 Encounter for screening for infections with a predominantly sexual mode of transmission: Secondary | ICD-10-CM | POA: Insufficient documentation

## 2012-03-24 DIAGNOSIS — J45909 Unspecified asthma, uncomplicated: Secondary | ICD-10-CM | POA: Insufficient documentation

## 2012-03-24 DIAGNOSIS — Z79899 Other long term (current) drug therapy: Secondary | ICD-10-CM | POA: Insufficient documentation

## 2012-03-24 DIAGNOSIS — Z8659 Personal history of other mental and behavioral disorders: Secondary | ICD-10-CM | POA: Insufficient documentation

## 2012-03-24 DIAGNOSIS — F17200 Nicotine dependence, unspecified, uncomplicated: Secondary | ICD-10-CM | POA: Insufficient documentation

## 2012-03-24 NOTE — ED Notes (Signed)
Pt here to be rechecked for std. States he was dx with chlamydia and gonerrhea about 2 weeks ago-had im injection and finished a course of antibiotics. Pt states he thinks he is still having some clear/malodorous discharge. nad noted.

## 2012-03-24 NOTE — ED Provider Notes (Signed)
History   This chart was scribed for Lyanne Co, MD by Toya Smothers, ED Scribe. The patient was seen in room [facility]. Patient's care was started at 1000.  CSN: [id number]  Arrival date & time 03/24/12  1000   First MD Initiated Contact with Patient 03/24/12 1007      Chief Complaint  Patient presents with  . SEXUALLY TRANSMITTED DISEASE    The history is provided by the patient. No language interpreter was used.    Shawn Ramsey is a 32 y.o. male who presents to the Emergency Department complaining of 2 days of new, unchanged moderate penile discharge. Pain is moderate. Discharge is nondescript. Symptoms are neither alleviated nor aggravated by anything. Pt is unaware of context of onset. No penile pain, fever, chills, cough, congestion, rhinorrhea, chest pain, SOB, or n/v/d. Pt is a current everyday smoker, denying alcohol and illicit drugs. Pt is sexually active. No pertinent  Medical Hx listed.   Past Medical History  Diagnosis Date  . 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 Hallucinations, visual     auditory and visual   . Mental health disorder 08/2007    mental health and learning disability grounds , he has never had a full time job and did not complete high school , his math and reading skills are limited .  [name] Learning disability childhood  . Asthma     History reviewed. No pertinent past surgical history.  Family History  Problem Relation Age of Onset  . Hypertension Father     History  Substance Use Topics  . Smoking status: Current Every Day Smoker -- 0.5 packs/day    Types: Cigarettes  . Smokeless tobacco: Not on file  . Alcohol Use: No    Review of Systems  Genitourinary: Positive for discharge. Negative for penile pain.  All other systems reviewed and are negative.    Allergies  Review of patient's allergies indicates no known allergies.  Home Medications   Current  Outpatient Rx  Name  Route  Sig  Dispense  Refill  . ALBUTEROL SULFATE HFA 108 (90 BASE) MCG/ACT IN AERS   Inhalation   Inhale 2 puffs into the lungs every 6 (six) hours as needed for wheezing.   1 Inhaler   3   . ALBUTEROL 90 MCG/ACT IN AERS   Inhalation   Inhale 2 puffs into the lungs every 6 (six) hours as needed. 2 puffs every 6 to 8 hours as needed for wheezing            BP 157/91  Pulse 103  Temp 98.1 F (36.7 C)  Resp 18  Ht 5\' 5"  (1.651 m)  Wt 252 lb (114.306 kg)  BMI 41.93 kg/m2  SpO2 98%  Physical Exam  Nursing note and vitals reviewed. Constitutional: He is oriented to person, place, and time. He appears well-developed and well-nourished. No distress.  HENT:  Head: Normocephalic and atraumatic.  Eyes: EOM are normal. Pupils are equal, round, and reactive to light.  Neck: Neck supple. No tracheal deviation present.  Cardiovascular: Normal rate.   Pulmonary/Chest: Effort normal. No respiratory distress.  Abdominal: Soft. He exhibits no distension.  Genitourinary: Penis normal. Right testis shows no mass and no tenderness. Left testis shows no mass and no tenderness. Circumcised. No penile erythema or penile tenderness. No discharge found.  Musculoskeletal: Normal range of motion. He exhibits no edema.  Neurological: He is alert  and oriented to person, place, and time. No sensory deficit.  Skin: Skin is warm and dry.  Psychiatric: He has a normal mood and affect. His behavior is normal.    ED Course  Procedures DIAGNOSTIC STUDIES: Oxygen Saturation is 98% on room air, normal by my interpretation.    COORDINATION OF CARE: 10:10- Evaluated Pt. Pt is awake, alert, and without distress. 10:1- Patient informed of clinical course, understand medical decision-making process, and agree with plan.     Labs Reviewed  GC/CHLAMYDIA PROBE AMP   No results found.   1. Screening for STD (sexually transmitted disease)       MDM  Referred to the health  department for additional STD screening.  Nothing on exam today to suggest urethritis.      I personally performed the services described in this documentation, which was scribed in my presence. The recorded information has been reviewed and is accurate.      Lyanne Co, MD 03/24/12 [phone]

## 2012-03-25 LAB — GC/CHLAMYDIA PROBE AMP
CT Probe RNA: NEGATIVE
GC Probe RNA: NEGATIVE

## 2012-07-26 ENCOUNTER — Telehealth: Payer: Self-pay | Admitting: Family Medicine

## 2012-07-26 ENCOUNTER — Other Ambulatory Visit: Payer: Self-pay | Admitting: Family Medicine

## 2012-07-26 DIAGNOSIS — G4730 Sleep apnea, unspecified: Secondary | ICD-10-CM

## 2012-07-26 NOTE — Telephone Encounter (Signed)
pls verify pt will go has 'Missed "multiple appt s,I will enter referral again

## 2012-08-02 ENCOUNTER — Telehealth: Payer: Self-pay | Admitting: Family Medicine

## 2012-08-02 ENCOUNTER — Other Ambulatory Visit: Payer: Self-pay

## 2012-08-02 MED ORDER — ALBUTEROL SULFATE HFA 108 (90 BASE) MCG/ACT IN AERS: 2.0000 | INHALATION_SPRAY | Freq: Four times a day (QID) | RESPIRATORY_TRACT | Status: DC | PRN

## 2012-08-02 NOTE — Telephone Encounter (Signed)
Med refilled.

## 2012-08-27 ENCOUNTER — Other Ambulatory Visit (HOSPITAL_COMMUNITY): Payer: Self-pay

## 2012-08-27 DIAGNOSIS — G4730 Sleep apnea, unspecified: Secondary | ICD-10-CM

## 2012-09-05 ENCOUNTER — Ambulatory Visit: Payer: Medicaid Other | Attending: Neurology | Admitting: Sleep Medicine

## 2012-09-05 DIAGNOSIS — Z6839 Body mass index (BMI) 39.0-39.9, adult: Secondary | ICD-10-CM | POA: Insufficient documentation

## 2012-09-05 DIAGNOSIS — G4730 Sleep apnea, unspecified: Secondary | ICD-10-CM

## 2012-09-05 DIAGNOSIS — G4733 Obstructive sleep apnea (adult) (pediatric): Secondary | ICD-10-CM | POA: Insufficient documentation

## 2012-09-13 ENCOUNTER — Ambulatory Visit (INDEPENDENT_AMBULATORY_CARE_PROVIDER_SITE_OTHER): Payer: Medicaid Other | Admitting: Family Medicine

## 2012-09-13 ENCOUNTER — Encounter: Payer: Self-pay | Admitting: Family Medicine

## 2012-09-13 VITALS — BP 122/80 | HR 95 | Resp 18 | Ht 65.0 in | Wt 263.0 lb

## 2012-09-13 DIAGNOSIS — R5383 Other fatigue: Secondary | ICD-10-CM

## 2012-09-13 DIAGNOSIS — E8881 Metabolic syndrome: Secondary | ICD-10-CM

## 2012-09-13 DIAGNOSIS — E669 Obesity, unspecified: Secondary | ICD-10-CM

## 2012-09-13 DIAGNOSIS — F29 Unspecified psychosis not due to a substance or known physiological condition: Secondary | ICD-10-CM

## 2012-09-13 DIAGNOSIS — R5381 Other malaise: Secondary | ICD-10-CM

## 2012-09-13 DIAGNOSIS — F17200 Nicotine dependence, unspecified, uncomplicated: Secondary | ICD-10-CM

## 2012-09-13 DIAGNOSIS — Z7251 High risk heterosexual behavior: Secondary | ICD-10-CM

## 2012-09-13 DIAGNOSIS — J45991 Cough variant asthma: Secondary | ICD-10-CM

## 2012-09-13 DIAGNOSIS — J309 Allergic rhinitis, unspecified: Secondary | ICD-10-CM | POA: Insufficient documentation

## 2012-09-13 MED ORDER — BUDESONIDE-FORMOTEROL FUMARATE 80-4.5 MCG/ACT IN AERO: 2.0000 | INHALATION_SPRAY | Freq: Two times a day (BID) | RESPIRATORY_TRACT | Status: DC

## 2012-09-13 MED ORDER — MONTELUKAST SODIUM 10 MG PO TABS: 10.0000 mg | ORAL_TABLET | Freq: Every day | ORAL | Status: DC

## 2012-09-13 NOTE — Patient Instructions (Addendum)
F/u in 4 month, call if you need me before  CXr today.  Please cut back on cigarettes you need to quit, to reduce cancer, stroke and heart attack risk   Fasting cBC, lipid, cmp, hBA1C and tSH, RPR, HIV tests this week pls, you will get  A message about the results, and you may also need a call  Pls keep appt for sleep apnea next week  New for breathing and allergies are daily symbicort and singulair

## 2012-09-15 NOTE — Procedures (Signed)
[facility] Adeliz Tonkinson A. Gerilyn Pilgrim, MD     [email or web address]        Shawn Ramsey, Shawn Ramsey              ACCOUNT NO.:  [id number]  MEDICAL RECORD NO.:  [id number]          PATIENT TYPE:  OUT  LOCATION:  SLEEP LAB                     FACILITY:  [facility]  PHYSICIAN:  Trianna Lupien A. Gerilyn Pilgrim, M.D. DATE OF BIRTH:  1980-08-19  DATE OF STUDY:                           NOCTURNAL POLYSOMNOGRAM  REFERRING PHYSICIAN:  Ezmae Speers A. Gerilyn Pilgrim, M.D.  INDICATION:  A 32 year old, who presents with witnessed apnea, fatigue, and snoring.  The study is being done to evaluate for obstructive sleep apnea syndrome.  MEDICATIONS:  Allopurinol and Symbicort.  EPWORTH SLEEPINESS SCALE:  7  BMI:  39  ARCHITECTURAL SUMMARY:  This is a split narrow recording with initial portion being a diagnostic and the second portion; a titration recording.  Total recording time is 361 minutes.  Sleep efficiency is 76%.  Sleep latency 1 minute.  REM latency 116 minutes.  RESPIRATORY SUMMARY:  Baseline oxygen saturation is 95, lowest saturation 75 during non-REM sleep.  Diagnostic AHI is 119. The patient was placed on positive pressure starting at 7 and increased to 20. Optimal pressure is 19 or 20, recommend 19; however, as the lowest effective pressure. Should have some visual snoring on this pressure, however, at times.  LIMB MOVEMENT SUMMARY:  PLM index 0.  ELECTROCARDIOGRAM SUMMARY:  Average heart rate is 77, with no significant dysrhythmias observed.  IMPRESSION:  Severe obstructive sleep apnea syndrome, which responds well to a CPAP of 19.      Naiomy Watters A. Gerilyn Pilgrim, M.D.    KAD/MEDQ  D:  09/15/2012 10:31:36  T:  09/15/2012 11:09:23  Job:  [id number]

## 2012-09-16 DIAGNOSIS — E8881 Metabolic syndrome: Secondary | ICD-10-CM | POA: Insufficient documentation

## 2012-09-16 NOTE — Progress Notes (Signed)
  Subjective:    Patient ID: Shawn Ramsey, male    DOB: 1981/02/01, 32 y.o.   MRN: [id number]  HPI The PT is here for follow up and re-evaluation of chronic medical conditions, medication management and review of any available recent lab and radiology data.  Preventive health is updated, specifically   Immunization.   Finally had his sleep study and will se sleep specialist next week, clinically he has sleep apnea The PT denies any adverse reactions to current medications since the last visit.  Increased cough and wheeze, only depending on rescue medication, his will need to change No commitment to nicotine cessation , and continues to gain weight        Review of Systems See HPI Denies recent fever or chills. Denies sinus pressure, nasal congestion, ear pain or sore throat. Denies chest congestion,  Denies chest pains, palpitations and leg swelling Denies abdominal pain, nausea, vomiting,diarrhea or constipation.   Denies dysuria, frequency, hesitancy or incontinence.Denies penile discharge Denies joint pain, swelling and limitation in mobility. Denies headaches, seizures, numbness, or tingling. . Denies skin break down or rash.        Objective:   Physical Exam  Patient alert and oriented and in no cardiopulmonary distress.Mild MR  HEENT: No facial asymmetry, EOMI, no sinus tenderness,  oropharynx pink and moist.  Neck supple no adenopathy.  Chest: Clear to auscultation bilaterally.No wheeze or crackles  CVS: S1, S2 no murmurs, no S3.  ABD: Soft , obese non tender.   Ext: No edema  MS: Adequate ROM spine, shoulders, hips and knees.  Skin: Intact, no ulcerations or rash noted.  Psych: Good eye contact,blunted affect. Memory intact not anxious or depressed appearing.  CNS: CN 2-12 intact, power, tone and sensation normal throughout.       Assessment & Plan:

## 2012-09-16 NOTE — Assessment & Plan Note (Signed)
Deteriorated. Patient re-educated about  the importance of commitment to a  minimum of 150 minutes of exercise per week. The importance of healthy food choices with portion control discussed. Encouraged to start a food diary, count calories and to consider  joining a support group. Sample diet sheets offered. Goals set by the patient for the next several months.    

## 2012-09-16 NOTE — Assessment & Plan Note (Signed)
Stale , followed by mental health

## 2012-09-16 NOTE — Assessment & Plan Note (Signed)
Uncontrolled, needs daily preventive med, educated about this

## 2012-09-16 NOTE — Assessment & Plan Note (Signed)
Condom use and responsible sexual behavior advised

## 2012-09-16 NOTE — Assessment & Plan Note (Signed)
High risk for CVD, counseled re the need to change ;lifestyle to reverse this

## 2012-09-16 NOTE — Assessment & Plan Note (Signed)
unchanged Patient counseled for approximately 5 minutes regarding the health risks of ongoing nicotine use, specifically all types of cancer, heart disease, stroke and respiratory failure. The options available for help with cessation ,the behavioral changes to assist the process, and the option to either gradully reduce usage  Or abruptly stop.is also discussed. Pt is also encouraged to set specific goals in number of cigarettes used daily, as well as to set a quit date.  

## 2012-12-27 ENCOUNTER — Other Ambulatory Visit: Payer: Self-pay

## 2013-01-09 ENCOUNTER — Ambulatory Visit: Payer: Medicaid Other | Admitting: Family Medicine

## 2013-04-16 ENCOUNTER — Ambulatory Visit: Payer: Medicaid Other | Admitting: Family Medicine

## 2013-05-01 ENCOUNTER — Ambulatory Visit: Payer: Medicaid Other | Admitting: Family Medicine

## 2013-06-05 ENCOUNTER — Encounter (HOSPITAL_COMMUNITY): Payer: Self-pay | Admitting: Emergency Medicine

## 2013-06-05 ENCOUNTER — Emergency Department (HOSPITAL_COMMUNITY)
Admission: EM | Admit: 2013-06-05 | Discharge: 2013-06-05 | Disposition: A | Discharge status: Home / Self Care | Payer: Medicaid Other | Attending: Emergency Medicine | Admitting: Emergency Medicine

## 2013-06-05 DIAGNOSIS — Z8669 Personal history of other diseases of the nervous system and sense organs: Secondary | ICD-10-CM | POA: Insufficient documentation

## 2013-06-05 DIAGNOSIS — IMO0002 Reserved for concepts with insufficient information to code with codable children: Secondary | ICD-10-CM | POA: Insufficient documentation

## 2013-06-05 DIAGNOSIS — J45909 Unspecified asthma, uncomplicated: Secondary | ICD-10-CM | POA: Insufficient documentation

## 2013-06-05 DIAGNOSIS — F17200 Nicotine dependence, unspecified, uncomplicated: Secondary | ICD-10-CM | POA: Insufficient documentation

## 2013-06-05 DIAGNOSIS — R369 Urethral discharge, unspecified: Secondary | ICD-10-CM | POA: Insufficient documentation

## 2013-06-05 DIAGNOSIS — Z8659 Personal history of other mental and behavioral disorders: Secondary | ICD-10-CM | POA: Insufficient documentation

## 2013-06-05 DIAGNOSIS — Z79899 Other long term (current) drug therapy: Secondary | ICD-10-CM | POA: Insufficient documentation

## 2013-06-05 DIAGNOSIS — Z202 Contact with and (suspected) exposure to infections with a predominantly sexual mode of transmission: Secondary | ICD-10-CM

## 2013-06-05 DIAGNOSIS — R300 Dysuria: Secondary | ICD-10-CM | POA: Insufficient documentation

## 2013-06-05 LAB — URINALYSIS, ROUTINE W REFLEX MICROSCOPIC
Bilirubin Urine: NEGATIVE
Glucose, UA: NEGATIVE mg/dL
Hgb urine dipstick: NEGATIVE
Ketones, ur: NEGATIVE mg/dL
Leukocytes, UA: NEGATIVE
Nitrite: NEGATIVE
Protein, ur: NEGATIVE mg/dL
Specific Gravity, Urine: 1.025 (ref 1.005–1.030)
Urobilinogen, UA: 0.2 mg/dL (ref 0.0–1.0)
pH: 6 (ref 5.0–8.0)

## 2013-06-05 MED ORDER — CEFTRIAXONE SODIUM 250 MG IJ SOLR
250.0000 mg | Freq: Once | INTRAMUSCULAR | Status: AC
  Administered 2013-06-05: 250 mg via INTRAMUSCULAR
  Filled 2013-06-05: qty 250

## 2013-06-05 MED ORDER — LIDOCAINE HCL (PF) 1 % IJ SOLN
INTRAMUSCULAR | Status: AC
  Administered 2013-06-05: 0.9 mL
  Filled 2013-06-05: qty 5

## 2013-06-05 MED ORDER — AZITHROMYCIN 250 MG PO TABS
1000.0000 mg | ORAL_TABLET | Freq: Once | ORAL | Status: AC
  Administered 2013-06-05: 1000 mg via ORAL
  Filled 2013-06-05: qty 4

## 2013-06-05 NOTE — ED Provider Notes (Signed)
CSN: [id number]     Arrival date & time 06/05/13  35 History   First MD Initiated Contact with Patient 06/05/13 1542     Chief Complaint  Patient presents with  . Exposure to STD     (Consider location/radiation/quality/duration/timing/severity/associated sxs/prior Treatment) Patient is a 33 y.o. male presenting with STD exposure. The history is provided by the patient.  Exposure to STD This is a new problem. The current episode started in the past 7 days. The problem occurs intermittently. The problem has been unchanged. Pertinent negatives include no abdominal pain, arthralgias, chest pain, coughing or neck pain. Associated symptoms comments: Some burning and discomfort with urination. There is a question of mild discharge from the penis.. Exacerbated by: urination. He has tried nothing for the symptoms. The treatment provided no relief.    Past Medical History  Diagnosis Date  . 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 Hallucinations, visual     auditory and visual   . Mental health disorder 08/2007    mental health and learning disability grounds , he has never had a full time job and did not complete high school , his math and reading skills are limited .  [name] Learning disability childhood  . Asthma    History reviewed. No pertinent past surgical history. Family History  Problem Relation Age of Onset  . Hypertension Father    History  Substance Use Topics  . Smoking status: Current Every Day Smoker -- 0.50 packs/day    Types: Cigarettes  . Smokeless tobacco: Not on file  . Alcohol Use: No    Review of Systems  Constitutional: Negative for activity change.       All ROS Neg except as noted in HPI  HENT: Negative for nosebleeds.   Eyes: Negative for photophobia and discharge.  Respiratory: Negative for cough, shortness of breath and wheezing.   Cardiovascular: Negative for chest pain and palpitations.   Gastrointestinal: Negative for abdominal pain and blood in stool.  Genitourinary: Positive for dysuria. Negative for frequency and hematuria.  Musculoskeletal: Negative for arthralgias, back pain and neck pain.  Skin: Negative.   Neurological: Negative for dizziness, seizures and speech difficulty.  Psychiatric/Behavioral: Negative for hallucinations and confusion.      Allergies  Review of patient's allergies indicates no known allergies.  Home Medications   Prior to Admission medications   Medication Sig Start Date End Date Taking? Authorizing Provider  budesonide-formoterol (SYMBICORT) 80-4.5 MCG/ACT inhaler Inhale 2 puffs into the lungs 2 (two) times daily. 09/13/12  Yes Fayrene Helper, MD  haloperidol (HALDOL) 2 MG tablet Take 2 mg by mouth at bedtime.   Yes Historical Provider, MD  montelukast (SINGULAIR) 10 MG tablet Take 1 tablet (10 mg total) by mouth at bedtime. 09/13/12 09/13/13 Yes Fayrene Helper, MD  traZODone (DESYREL) 100 MG tablet Take 100 mg by mouth at bedtime.   Yes Historical Provider, MD  albuterol (PROVENTIL HFA;VENTOLIN HFA) 108 (90 BASE) MCG/ACT inhaler Inhale 2 puffs into the lungs every 6 (six) hours as needed for wheezing. 08/02/12   Fayrene Helper, MD   BP 157/69  Pulse 91  Temp(Src) 97.6 F (36.4 C) (Oral)  Resp 18  Ht 5\' 5"  (1.651 m)  Wt 270 lb (122.471 kg)  BMI 44.93 kg/m2  SpO2 100% Physical Exam  Nursing note and vitals reviewed. Constitutional: He is oriented to person, place, and time. He appears well-developed and well-nourished.  Non-toxic appearance.  HENT:  Head: Normocephalic.  Right Ear: Tympanic membrane and external ear normal.  Left Ear: Tympanic membrane and external ear normal.  Eyes: EOM and lids are normal. Pupils are equal, round, and reactive to light.  Neck: Normal range of motion. Neck supple. Carotid bruit is not present.  Cardiovascular: Normal rate, regular rhythm, normal heart sounds, intact distal pulses and  normal pulses.   Pulmonary/Chest: Breath sounds normal. No respiratory distress.  Abdominal: Soft. Bowel sounds are normal. There is no tenderness. There is no guarding. Hernia confirmed negative in the right inguinal area and confirmed negative in the left inguinal area.  Genitourinary: Testes normal. Right testis shows no mass, no swelling and no tenderness. Left testis shows no mass, no swelling and no tenderness. Circumcised. No penile erythema or penile tenderness. No discharge found.  Musculoskeletal: Normal range of motion.  Lymphadenopathy:       Head (right side): No submandibular adenopathy present.       Head (left side): No submandibular adenopathy present.    He has no cervical adenopathy.  Neurological: He is alert and oriented to person, place, and time. He has normal strength. No cranial nerve deficit or sensory deficit.  Skin: Skin is warm and dry.  Psychiatric: He has a normal mood and affect. His speech is normal.    ED Course  Procedures (including critical care time) Labs Review Labs Reviewed  URINALYSIS, ROUTINE W REFLEX MICROSCOPIC    Imaging Review No results found.   EKG Interpretation None      MDM Patient states he has been having unprotected intercourse. He complains of a burning sensation for the past 3 days. He thinks that he may have seen some discharge in his underwear.  Urinalysis is well within normal limits. Vital signs are well within normal limits. Pulse oximetry is 100% on room air. Within normal limits by my interpretation. Patient treated in the emergency department with intramuscular Rocephin and oral Zithromax. Culture from the urethra sent to the lab.    Final diagnoses:  None    **I have reviewed nursing notes, vital signs, and all appropriate lab and imaging results for this patient.Lenox Ahr, PA-C 06/05/13 [phone]

## 2013-06-05 NOTE — ED Notes (Signed)
Pt co burning urination since having sex on Sun. Wants to be checked for STDs.

## 2013-06-05 NOTE — Discharge Instructions (Signed)
You were treated in the emergency room with intramuscular Rocephin, and oral Zithromax. Please refrain from all sexual activity for the next 7 days. Please make an appointment at your primary physician, or the health department for additional testing (HIV, Hepatitis, etc,)

## 2013-06-06 LAB — GC/CHLAMYDIA PROBE AMP
CT Probe RNA: NEGATIVE
GC Probe RNA: NEGATIVE

## 2013-06-06 NOTE — ED Provider Notes (Signed)
Medical screening examination/treatment/procedure(s) were performed by non-physician practitioner and as supervising physician I was immediately available for consultation/collaboration.   EKG Interpretation None        Tanna Furry, MD 06/06/13 [phone]

## 2013-11-20 ENCOUNTER — Ambulatory Visit: Payer: Medicaid Other | Admitting: Family Medicine

## 2013-11-25 ENCOUNTER — Emergency Department (HOSPITAL_COMMUNITY): Payer: Medicaid Other

## 2013-11-25 ENCOUNTER — Encounter (HOSPITAL_COMMUNITY): Payer: Self-pay | Admitting: Emergency Medicine

## 2013-11-25 ENCOUNTER — Emergency Department (HOSPITAL_COMMUNITY)
Admission: EM | Admit: 2013-11-25 | Discharge: 2013-11-25 | Disposition: A | Discharge status: Home / Self Care | Payer: Medicaid Other | Attending: Emergency Medicine | Admitting: Emergency Medicine

## 2013-11-25 DIAGNOSIS — R51 Headache: Secondary | ICD-10-CM | POA: Insufficient documentation

## 2013-11-25 DIAGNOSIS — Z79899 Other long term (current) drug therapy: Secondary | ICD-10-CM | POA: Diagnosis not present

## 2013-11-25 DIAGNOSIS — Z8659 Personal history of other mental and behavioral disorders: Secondary | ICD-10-CM | POA: Insufficient documentation

## 2013-11-25 DIAGNOSIS — R0789 Other chest pain: Secondary | ICD-10-CM | POA: Insufficient documentation

## 2013-11-25 DIAGNOSIS — E669 Obesity, unspecified: Secondary | ICD-10-CM | POA: Diagnosis not present

## 2013-11-25 DIAGNOSIS — J45901 Unspecified asthma with (acute) exacerbation: Secondary | ICD-10-CM | POA: Diagnosis not present

## 2013-11-25 DIAGNOSIS — R42 Dizziness and giddiness: Secondary | ICD-10-CM | POA: Diagnosis present

## 2013-11-25 LAB — BASIC METABOLIC PANEL
Anion gap: 14 (ref 5–15)
BUN: 16 mg/dL (ref 6–23)
CO2: 23 mEq/L (ref 19–32)
Calcium: 9.2 mg/dL (ref 8.4–10.5)
Chloride: 106 mEq/L (ref 96–112)
Creatinine, Ser: 0.75 mg/dL (ref 0.50–1.35)
GFR calc Af Amer: >90 mL/min (ref >90)
GFR calc non Af Amer: >90 mL/min (ref >90)
Glucose, Bld: 88 mg/dL (ref 70–99)
Potassium: 4.1 mEq/L (ref 3.7–5.3)
Sodium: 143 mEq/L (ref 137–147)

## 2013-11-25 NOTE — ED Provider Notes (Signed)
CSN: [id number]     Arrival date & time 11/25/13  1929 History   First MD Initiated Contact with Patient 11/25/13 2036     Chief Complaint  Patient presents with  . Hypertension     (Consider location/radiation/quality/duration/timing/severity/associated sxs/prior Treatment) The history is provided by the patient.   Shawn Ramsey is a 33 y.o. male presenting for evaluation of hypertension, intermittent generalized headaches and feeling slightly "swimmy headed",  Describing he sees a slight shaky movement in his field of vision when he stares at an object.  He first noticed this symptom last week.  He was being evaluated at [facility] today at which time they checked his blood pressure which was 155/96.  He was advised to followup with his PCP, chose to present here.  He denies fevers or chills, facial pain, nasal congestion or sore throat.  He does endorse intermittent shortness of breath with exertion, sometimes develops mid sternal chest discomfort with exertion, but not in recent days.  He denies coughing, wheezing.  He does smoke cigarettes, denies drug use.  He does have a history of asthma but has not used this inhaler recently.  He was scheduled to see his PCP last week, but missed the appointment.  He denies peripheral edema.  He does endorse generalized exercise intolerance and is desirous of assistance with weight loss.     Past Medical History  Diagnosis Date  . 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 Hallucinations, visual     auditory and visual   . Mental health disorder 08/2007    mental health and learning disability grounds , he has never had a full time job and did not complete high school , his math and reading skills are limited .  [name] Learning disability childhood  . Asthma    History reviewed. No pertinent past surgical history. Family History  Problem Relation Age of Onset  . Hypertension Father     History  Substance Use Topics  . Smoking status: Current Every Day Smoker -- 0.50 packs/day    Types: Cigarettes  . Smokeless tobacco: Not on file  . Alcohol Use: No    Review of Systems  Constitutional: Negative for fever.  HENT: Negative for congestion and sore throat.   Eyes: Negative.   Respiratory: Positive for chest tightness and shortness of breath.   Cardiovascular: Negative for chest pain.  Gastrointestinal: Negative for nausea and abdominal pain.  Genitourinary: Negative.   Musculoskeletal: Negative for arthralgias, joint swelling and neck pain.  Skin: Negative.  Negative for rash and wound.  Neurological: Positive for dizziness and headaches. Negative for weakness, light-headedness and numbness.  Psychiatric/Behavioral: Negative.       Allergies  Review of patient's allergies indicates no known allergies.  Home Medications   Prior to Admission medications   Medication Sig Start Date End Date Taking? Authorizing Provider  ibuprofen (ADVIL,MOTRIN) 200 MG tablet Take 200 mg by mouth every 6 (six) hours as needed for mild pain or moderate pain.   Yes Historical Provider, MD  albuterol (PROVENTIL HFA;VENTOLIN HFA) 108 (90 BASE) MCG/ACT inhaler Inhale 2 puffs into the lungs every 6 (six) hours as needed for wheezing. 08/02/12   Fayrene Helper, MD  budesonide-formoterol [facility]) 80-4.5 MCG/ACT inhaler Inhale 2 puffs into the lungs 2 (two) times daily. 09/13/12   Fayrene Helper, MD   BP 111/63  Pulse 90  Temp(Src) 98.1 F (36.7 C) (  Oral)  Resp 20  Ht 5\' 5"  (1.651 m)  Wt 271 lb (122.925 kg)  BMI 45.10 kg/m2  SpO2 99% Physical Exam  Nursing note and vitals reviewed. Constitutional: He is oriented to person, place, and time. He appears well-developed and well-nourished.  Obese.  Pleasant gentleman.  HENT:  Head: Normocephalic and atraumatic.  Mouth/Throat: Oropharynx is clear and moist.  Eyes: Conjunctivae and EOM are normal. Pupils are equal, round,  and reactive to light.  Neck: Normal range of motion. Neck supple.  Cardiovascular: Normal rate, regular rhythm, normal heart sounds and intact distal pulses.   Pulmonary/Chest: Effort normal and breath sounds normal. No respiratory distress. He has no wheezes.  Abdominal: Soft. Bowel sounds are normal. There is no tenderness.  Musculoskeletal: Normal range of motion. He exhibits no edema.  Lymphadenopathy:    He has no cervical adenopathy.  Neurological: He is alert and oriented to person, place, and time. He has normal strength. No sensory deficit. Gait normal. GCS eye subscore is 4. GCS verbal subscore is 5. GCS motor subscore is 6.  Normal heel-shin, normal rapid alternating movements. Cranial nerves III-XII intact.  No pronator drift.  Skin: Skin is warm and dry. No rash noted.  Psychiatric: He has a normal mood and affect. His speech is normal and behavior is normal. Thought content normal. Cognition and memory are normal.    ED Course  Procedures (including critical care time) Labs Review Labs Reviewed  BASIC METABOLIC PANEL    Imaging Review Dg Chest 2 View  11/25/2013   CLINICAL DATA:  Headache. Dizziness. Intermittent shortness of breath, not acute. History of asthma.  EXAM: CHEST  2 VIEW  COMPARISON:  None.  FINDINGS: Normal sized heart. Clear lungs with normal vascularity. The hemidiaphragms are flattened on the lateral view. Unremarkable bones.  IMPRESSION: Mild hyperexpansion of the lungs compatible with the history of asthma. No acute abnormality.   Electronically Signed   By: Enrique Sack M.D.   On: 11/25/2013 22:03     EKG Interpretation   Date/Time:  Monday November 25 2013 21:07:27 EDT Ventricular Rate:  74 PR Interval:  143 QRS Duration: 94 QT Interval:  376 QTC Calculation: 417 R Axis:   72 Text Interpretation:  Sinus rhythm Borderline repolarization abnormality  nonspecific T wave changes No old tracing to compare Confirmed by CAMPOS   MD, Lennette Bihari ([id number]) on  11/25/2013 10:35:52 PM      MDM   Final diagnoses:  Episodic lightheadedness    Normal bp here, labs normal.  Pt with essentially normal exam today.  He does have nonspecific changes on his ekg, no acute findings.  He was encouraged to f/u with his pcp this week for a recheck and for consideration of cardiac stress testing.  Suspect patient may simply have exercise intolerance due to obesity, but further non emergent evaluation for cardiac source of sx may be warranted.  Pt agrees to contact pcp for further eval.    Evalee Jefferson, PA-C 11/26/13 1416

## 2013-11-25 NOTE — Discharge Instructions (Signed)
Dizziness  Dizziness means you feel unsteady or lightheaded. You might feel like you are going to pass out (faint). HOME CARE   Drink enough fluids to keep your pee (urine) clear or pale yellow.  Take your medicines exactly as told by your doctor. If you take blood pressure medicine, always stand up slowly from the lying or sitting position. Hold on to something to steady yourself.  If you need to stand in one place for a long time, move your legs often. Tighten and relax your leg muscles.  Have someone stay with you until you feel okay.  Do not drive or use heavy machinery if you feel dizzy.  Do not drink alcohol. GET HELP RIGHT AWAY IF:   You feel dizzy or lightheaded and it gets worse.  You feel sick to your stomach (nauseous), or you throw up (vomit).  You have trouble talking or walking.  You feel weak or have trouble using your arms, hands, or legs.  You cannot think clearly or have trouble forming sentences.  You have chest pain, belly (abdominal) pain, sweating, or you are short of breath.  Your vision changes.  You are bleeding.  You have problems from your medicine that seem to be getting worse. MAKE SURE YOU:   Understand these instructions.  Will watch your condition.  Will get help right away if you are not doing well or get worse. Document Released: 01/27/2011 Document Revised: 05/02/2011 Document Reviewed: 01/27/2011 [facility] Patient Information 2015 [place], [place]. This information is not intended to replace advice given to you by your health care provider. Make sure you discuss any questions you have with your health care provider.   Your lab tests, chest xray and ekg are ok today.  You should have close followup with your doctor this week for further evaluation of your symptoms.
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
## 2013-11-27 NOTE — ED Provider Notes (Signed)
Medical screening examination/treatment/procedure(s) were performed by non-physician practitioner and as supervising physician I was immediately available for consultation/collaboration.   EKG Interpretation   Date/Time:  Monday November 25 2013 21:07:27 EDT Ventricular Rate:  74 PR Interval:  143 QRS Duration: 94 QT Interval:  376 QTC Calculation: 417 R Axis:   72 Text Interpretation:  Sinus rhythm Borderline repolarization abnormality  nonspecific T wave changes No old tracing to compare Confirmed by Cleston Lautner   MD, Jayin Derousse ([id number]) on 11/25/2013 10:35:52 PM        Hoy Morn, MD 11/27/13 [phone]

## 2014-03-06 ENCOUNTER — Emergency Department (HOSPITAL_COMMUNITY): Payer: Medicaid Other

## 2014-03-06 ENCOUNTER — Encounter (HOSPITAL_COMMUNITY): Payer: Self-pay

## 2014-03-06 ENCOUNTER — Inpatient Hospital Stay (HOSPITAL_COMMUNITY)
Admission: EM | Admit: 2014-03-06 | Discharge: 2014-03-18 | DRG: 463 | Disposition: A | Discharge status: Home Health | Payer: Medicaid Other | Attending: General Surgery | Admitting: General Surgery

## 2014-03-06 DIAGNOSIS — Z6841 Body Mass Index (BMI) 40.0 and over, adult: Secondary | ICD-10-CM

## 2014-03-06 DIAGNOSIS — M25062 Hemarthrosis, left knee: Secondary | ICD-10-CM | POA: Diagnosis present

## 2014-03-06 DIAGNOSIS — J45909 Unspecified asthma, uncomplicated: Secondary | ICD-10-CM | POA: Diagnosis present

## 2014-03-06 DIAGNOSIS — S0003XA Contusion of scalp, initial encounter: Secondary | ICD-10-CM | POA: Diagnosis present

## 2014-03-06 DIAGNOSIS — J9690 Respiratory failure, unspecified, unspecified whether with hypoxia or hypercapnia: Secondary | ICD-10-CM

## 2014-03-06 DIAGNOSIS — R579 Shock, unspecified: Secondary | ICD-10-CM | POA: Diagnosis present

## 2014-03-06 DIAGNOSIS — S82142A Displaced bicondylar fracture of left tibia, initial encounter for closed fracture: Secondary | ICD-10-CM

## 2014-03-06 DIAGNOSIS — J9601 Acute respiratory failure with hypoxia: Secondary | ICD-10-CM | POA: Diagnosis present

## 2014-03-06 DIAGNOSIS — D689 Coagulation defect, unspecified: Secondary | ICD-10-CM | POA: Diagnosis present

## 2014-03-06 DIAGNOSIS — Z01818 Encounter for other preprocedural examination: Secondary | ICD-10-CM

## 2014-03-06 DIAGNOSIS — S82102A Unspecified fracture of upper end of left tibia, initial encounter for closed fracture: Secondary | ICD-10-CM

## 2014-03-06 DIAGNOSIS — R739 Hyperglycemia, unspecified: Secondary | ICD-10-CM | POA: Diagnosis not present

## 2014-03-06 DIAGNOSIS — R578 Other shock: Secondary | ICD-10-CM

## 2014-03-06 DIAGNOSIS — E876 Hypokalemia: Secondary | ICD-10-CM | POA: Diagnosis present

## 2014-03-06 DIAGNOSIS — N390 Urinary tract infection, site not specified: Secondary | ICD-10-CM | POA: Diagnosis present

## 2014-03-06 DIAGNOSIS — Z9289 Personal history of other medical treatment: Secondary | ICD-10-CM

## 2014-03-06 DIAGNOSIS — S81832A Puncture wound without foreign body, left lower leg, initial encounter: Secondary | ICD-10-CM | POA: Diagnosis present

## 2014-03-06 DIAGNOSIS — J9811 Atelectasis: Secondary | ICD-10-CM | POA: Diagnosis not present

## 2014-03-06 DIAGNOSIS — S75009A Unspecified injury of femoral artery, unspecified leg, initial encounter: Secondary | ICD-10-CM | POA: Diagnosis present

## 2014-03-06 DIAGNOSIS — J9600 Acute respiratory failure, unspecified whether with hypoxia or hypercapnia: Secondary | ICD-10-CM | POA: Diagnosis present

## 2014-03-06 DIAGNOSIS — G4733 Obstructive sleep apnea (adult) (pediatric): Secondary | ICD-10-CM | POA: Diagnosis present

## 2014-03-06 DIAGNOSIS — W3400XA Accidental discharge from unspecified firearms or gun, initial encounter: Secondary | ICD-10-CM

## 2014-03-06 DIAGNOSIS — T1490XA Injury, unspecified, initial encounter: Secondary | ICD-10-CM

## 2014-03-06 DIAGNOSIS — S75012A Minor laceration of femoral artery, left leg, initial encounter: Secondary | ICD-10-CM | POA: Diagnosis present

## 2014-03-06 DIAGNOSIS — R000 Tachycardia, unspecified: Secondary | ICD-10-CM | POA: Diagnosis present

## 2014-03-06 DIAGNOSIS — Z4659 Encounter for fitting and adjustment of other gastrointestinal appliance and device: Secondary | ICD-10-CM

## 2014-03-06 DIAGNOSIS — S8990XA Unspecified injury of unspecified lower leg, initial encounter: Secondary | ICD-10-CM | POA: Diagnosis not present

## 2014-03-06 DIAGNOSIS — D62 Acute posthemorrhagic anemia: Secondary | ICD-10-CM | POA: Diagnosis present

## 2014-03-06 DIAGNOSIS — R4182 Altered mental status, unspecified: Secondary | ICD-10-CM

## 2014-03-06 DIAGNOSIS — F17210 Nicotine dependence, cigarettes, uncomplicated: Secondary | ICD-10-CM | POA: Diagnosis present

## 2014-03-06 DIAGNOSIS — Z419 Encounter for procedure for purposes other than remedying health state, unspecified: Secondary | ICD-10-CM

## 2014-03-06 DIAGNOSIS — S0990XA Unspecified injury of head, initial encounter: Secondary | ICD-10-CM

## 2014-03-06 DIAGNOSIS — T794XXA Traumatic shock, initial encounter: Secondary | ICD-10-CM | POA: Diagnosis present

## 2014-03-06 DIAGNOSIS — R0902 Hypoxemia: Secondary | ICD-10-CM

## 2014-03-06 DIAGNOSIS — R0989 Other specified symptoms and signs involving the circulatory and respiratory systems: Secondary | ICD-10-CM | POA: Diagnosis not present

## 2014-03-06 DIAGNOSIS — S82142B Displaced bicondylar fracture of left tibia, initial encounter for open fracture type I or II: Principal | ICD-10-CM | POA: Diagnosis present

## 2014-03-06 LAB — BLOOD GAS, ARTERIAL
Acid-base deficit: 5 mmol/L — ABNORMAL HIGH (ref 0.0–2.0)
Bicarbonate: 20.6 mEq/L (ref 20.0–24.0)
Drawn by: 39898
FIO2: 100 %
MECHVT: 600 mL
O2 Saturation: 98.9 %
PEEP: 8 cmH2O
Patient temperature: 100.2
RATE: 24 resp/min
TCO2: 21.9 mmol/L (ref 0–100)
pCO2 arterial: 46.4 mmHg — ABNORMAL HIGH (ref 35.0–45.0)
pH, Arterial: 7.274 — ABNORMAL LOW (ref 7.350–7.450)
pO2, Arterial: 208 mmHg — ABNORMAL HIGH (ref 80.0–100.0)

## 2014-03-06 LAB — COMPREHENSIVE METABOLIC PANEL
ALT: 30 U/L (ref 0–53)
AST: 32 U/L (ref 0–37)
Albumin: 2.8 g/dL — ABNORMAL LOW (ref 3.5–5.2)
Alkaline Phosphatase: 53 U/L (ref 39–117)
Anion gap: 25 — ABNORMAL HIGH (ref 5–15)
BUN: 11 mg/dL (ref 6–23)
CO2: 10 mmol/L — CL (ref 19–32)
Calcium: 7.3 mg/dL — ABNORMAL LOW (ref 8.4–10.5)
Chloride: 109 mEq/L (ref 96–112)
Creatinine, Ser: 1.27 mg/dL (ref 0.50–1.35)
GFR calc Af Amer: 85 mL/min — ABNORMAL LOW (ref >90)
GFR calc non Af Amer: 73 mL/min — ABNORMAL LOW (ref >90)
Glucose, Bld: 244 mg/dL — ABNORMAL HIGH (ref 70–99)
Potassium: 3.5 mmol/L (ref 3.5–5.1)
Sodium: 144 mmol/L (ref 135–145)
Total Bilirubin: 0.3 mg/dL (ref 0.3–1.2)
Total Protein: 4.9 g/dL — ABNORMAL LOW (ref 6.0–8.3)

## 2014-03-06 LAB — CBC
HCT: 30.8 % — ABNORMAL LOW (ref 39.0–52.0)
HCT: 36.3 % — ABNORMAL LOW (ref 39.0–52.0)
Hemoglobin: 9.8 g/dL — ABNORMAL LOW (ref 13.0–17.0)
Hemoglobin: 12.7 g/dL — ABNORMAL LOW (ref 13.0–17.0)
MCH: 29 pg (ref 26.0–34.0)
MCH: 28.8 pg (ref 26.0–34.0)
MCHC: 31.8 g/dL (ref 30.0–36.0)
MCHC: 35 g/dL (ref 30.0–36.0)
MCV: 91.1 fL (ref 78.0–100.0)
MCV: 82.3 fL (ref 78.0–100.0)
Platelets: 255 10*3/uL (ref 150–400)
Platelets: 175 10*3/uL (ref 150–400)
RBC: 3.38 MIL/uL — ABNORMAL LOW (ref 4.22–5.81)
RBC: 4.41 MIL/uL (ref 4.22–5.81)
RDW: 15.7 % — ABNORMAL HIGH (ref 11.5–15.5)
RDW: 15.8 % — ABNORMAL HIGH (ref 11.5–15.5)
WBC: 13.5 10*3/uL — ABNORMAL HIGH (ref 4.0–10.5)
WBC: 12.9 10*3/uL — ABNORMAL HIGH (ref 4.0–10.5)

## 2014-03-06 LAB — I-STAT CHEM 8, ED
BUN: 13 mg/dL (ref 6–23)
Calcium, Ion: 0.85 mmol/L — ABNORMAL LOW (ref 1.12–1.23)
Chloride: 109 mEq/L (ref 96–112)
Creatinine, Ser: 1.2 mg/dL (ref 0.50–1.35)
Glucose, Bld: 228 mg/dL — ABNORMAL HIGH (ref 70–99)
HCT: 30 % — ABNORMAL LOW (ref 39.0–52.0)
Hemoglobin: 10.2 g/dL — ABNORMAL LOW (ref 13.0–17.0)
Potassium: 3.5 mmol/L (ref 3.5–5.1)
Sodium: 144 mmol/L (ref 135–145)
TCO2: 7 mmol/L (ref 0–100)

## 2014-03-06 LAB — BASIC METABOLIC PANEL
Anion gap: 12 (ref 5–15)
BUN: 12 mg/dL (ref 6–23)
CO2: 15 mmol/L — ABNORMAL LOW (ref 19–32)
Calcium: 7.2 mg/dL — ABNORMAL LOW (ref 8.4–10.5)
Chloride: 113 mEq/L — ABNORMAL HIGH (ref 96–112)
Creatinine, Ser: 1.24 mg/dL (ref 0.50–1.35)
GFR calc Af Amer: 87 mL/min — ABNORMAL LOW (ref >90)
GFR calc non Af Amer: 75 mL/min — ABNORMAL LOW (ref >90)
Glucose, Bld: 181 mg/dL — ABNORMAL HIGH (ref 70–99)
Potassium: 5 mmol/L (ref 3.5–5.1)
Sodium: 140 mmol/L (ref 135–145)

## 2014-03-06 LAB — PROTIME-INR
INR: 1.74 — ABNORMAL HIGH (ref 0.00–1.49)
INR: 1.39 (ref 0.00–1.49)
Prothrombin Time: 20.5 seconds — ABNORMAL HIGH (ref 11.6–15.2)
Prothrombin Time: 17.2 seconds — ABNORMAL HIGH (ref 11.6–15.2)

## 2014-03-06 LAB — LACTIC ACID, PLASMA: Lactic Acid, Venous: 11.7 mmol/L — ABNORMAL HIGH (ref 0.5–2.2)

## 2014-03-06 LAB — CBC WITH DIFFERENTIAL/PLATELET
Basophils Absolute: 0 10*3/uL (ref 0.0–0.1)
Basophils Relative: 0 % (ref 0–1)
Eosinophils Absolute: 0.2 10*3/uL (ref 0.0–0.7)
Eosinophils Relative: 1 % (ref 0–5)
HCT: 36.2 % — ABNORMAL LOW (ref 39.0–52.0)
Hemoglobin: 12.3 g/dL — ABNORMAL LOW (ref 13.0–17.0)
Lymphocytes Relative: 12 % (ref 12–46)
Lymphs Abs: 2.5 10*3/uL (ref 0.7–4.0)
MCH: 29.5 pg (ref 26.0–34.0)
MCHC: 34 g/dL (ref 30.0–36.0)
MCV: 86.8 fL (ref 78.0–100.0)
Monocytes Absolute: 0.8 10*3/uL (ref 0.1–1.0)
Monocytes Relative: 4 % (ref 3–12)
Neutro Abs: 17.4 10*3/uL — ABNORMAL HIGH (ref 1.7–7.7)
Neutrophils Relative %: 83 % — ABNORMAL HIGH (ref 43–77)
Platelets: 226 10*3/uL (ref 150–400)
RBC: 4.17 MIL/uL — ABNORMAL LOW (ref 4.22–5.81)
RDW: 15.4 % (ref 11.5–15.5)
WBC: 20.9 10*3/uL — ABNORMAL HIGH (ref 4.0–10.5)

## 2014-03-06 LAB — I-STAT ARTERIAL BLOOD GAS, ED
Acid-base deficit: 18 mmol/L — ABNORMAL HIGH (ref 0.0–2.0)
Bicarbonate: 13.2 mEq/L — ABNORMAL LOW (ref 20.0–24.0)
O2 Saturation: 91 %
Patient temperature: 97.4
TCO2: 15 mmol/L (ref 0–100)
pCO2 arterial: 50 mmHg — ABNORMAL HIGH (ref 35.0–45.0)
pH, Arterial: 7.024 — CL (ref 7.350–7.450)
pO2, Arterial: 86 mmHg (ref 80.0–100.0)

## 2014-03-06 LAB — PREPARE PLATELET PHERESIS: Unit division: 0

## 2014-03-06 LAB — TRIGLYCERIDES: Triglycerides: 130 mg/dL (ref <150)

## 2014-03-06 LAB — ABO/RH: ABO/RH(D): O POS

## 2014-03-06 LAB — MRSA PCR SCREENING: MRSA by PCR: NEGATIVE

## 2014-03-06 LAB — CDS SEROLOGY

## 2014-03-06 LAB — ETHANOL: Alcohol, Ethyl (B): 126 mg/dL — ABNORMAL HIGH (ref 0–9)

## 2014-03-06 MED ORDER — SODIUM BICARBONATE 8.4 % IV SOLN
100.0000 meq | Freq: Once | INTRAVENOUS | Status: AC
  Administered 2014-03-06: 100 meq via INTRAVENOUS
  Filled 2014-03-06: qty 100

## 2014-03-06 MED ORDER — ONDANSETRON HCL 4 MG PO TABS: 4.0000 mg | ORAL_TABLET | Freq: Four times a day (QID) | ORAL | Status: DC | PRN

## 2014-03-06 MED ORDER — IOHEXOL 350 MG/ML SOLN
100.0000 mL | Freq: Once | INTRAVENOUS | Status: AC | PRN
  Administered 2014-03-06: 100 mL via INTRAVENOUS

## 2014-03-06 MED ORDER — PANTOPRAZOLE SODIUM 40 MG PO TBEC: 40.0000 mg | DELAYED_RELEASE_TABLET | Freq: Every day | ORAL | Status: DC

## 2014-03-06 MED ORDER — TRANEXAMIC ACID 100 MG/ML IV SOLN: 1000.0000 mg | Freq: Once | INTRAVENOUS | Status: DC

## 2014-03-06 MED ORDER — PROPOFOL 10 MG/ML IV EMUL: 0.0000 ug/kg/min | INTRAVENOUS | Status: DC

## 2014-03-06 MED ORDER — ROCURONIUM BROMIDE 50 MG/5ML IV SOLN
INTRAVENOUS | Status: AC | PRN
  Administered 2014-03-06 (×2): 100 mg via INTRAVENOUS

## 2014-03-06 MED ORDER — IPRATROPIUM-ALBUTEROL 0.5-2.5 (3) MG/3ML IN SOLN
3.0000 mL | RESPIRATORY_TRACT | Status: DC
  Administered 2014-03-06: 3 mL via RESPIRATORY_TRACT
  Filled 2014-03-06: qty 3

## 2014-03-06 MED ORDER — ALBUMIN HUMAN 5 % IV SOLN
INTRAVENOUS | Status: AC
  Administered 2014-03-06: 12.5 g
  Filled 2014-03-06: qty 500

## 2014-03-06 MED ORDER — SODIUM CHLORIDE 0.9 % IV SOLN
1.0000 mg/h | INTRAVENOUS | Status: DC
  Administered 2014-03-06 – 2014-03-10 (×3): 1 mg/h via INTRAVENOUS
  Filled 2014-03-06 (×3): qty 10

## 2014-03-06 MED ORDER — CEFAZOLIN SODIUM-DEXTROSE 2-3 GM-% IV SOLR
2.0000 g | Freq: Once | INTRAVENOUS | Status: AC
  Administered 2014-03-06: 2 g via INTRAVENOUS
  Filled 2014-03-06: qty 50

## 2014-03-06 MED ORDER — SUCCINYLCHOLINE CHLORIDE 20 MG/ML IJ SOLN
INTRAMUSCULAR | Status: AC
  Filled 2014-03-06: qty 1

## 2014-03-06 MED ORDER — SODIUM CHLORIDE 0.9 % IV SOLN
25.0000 ug/h | INTRAVENOUS | Status: DC
  Administered 2014-03-06: 25 ug/h via INTRAVENOUS
  Administered 2014-03-08 (×2): 100 ug/h via INTRAVENOUS
  Administered 2014-03-10: 75 ug/h via INTRAVENOUS
  Filled 2014-03-06 (×4): qty 50

## 2014-03-06 MED ORDER — FENTANYL CITRATE 0.05 MG/ML IJ SOLN
100.0000 ug | INTRAMUSCULAR | Status: DC | PRN
  Administered 2014-03-12 – 2014-03-14 (×4): 100 ug via INTRAVENOUS
  Filled 2014-03-06 (×5): qty 2

## 2014-03-06 MED ORDER — ONDANSETRON HCL 4 MG/2ML IJ SOLN
4.0000 mg | Freq: Four times a day (QID) | INTRAMUSCULAR | Status: DC | PRN
  Administered 2014-03-17: 4 mg via INTRAVENOUS
  Filled 2014-03-06: qty 2

## 2014-03-06 MED ORDER — PANTOPRAZOLE SODIUM 40 MG IV SOLR
40.0000 mg | Freq: Every day | INTRAVENOUS | Status: DC
  Administered 2014-03-06 – 2014-03-14 (×8): 40 mg via INTRAVENOUS
  Filled 2014-03-06 (×12): qty 40

## 2014-03-06 MED ORDER — SODIUM CHLORIDE 0.9 % IV SOLN: INTRAVENOUS | Status: DC | PRN

## 2014-03-06 MED ORDER — LACTATED RINGERS IV BOLUS (SEPSIS)
1000.0000 mL | Freq: Once | INTRAVENOUS | Status: AC
  Administered 2014-03-06: 1000 mL via INTRAVENOUS

## 2014-03-06 MED ORDER — ETOMIDATE 2 MG/ML IV SOLN
INTRAVENOUS | Status: AC | PRN
  Administered 2014-03-06: 30 mg via INTRAVENOUS

## 2014-03-06 MED ORDER — CEFAZOLIN SODIUM-DEXTROSE 2-3 GM-% IV SOLR
2.0000 g | Freq: Three times a day (TID) | INTRAVENOUS | Status: DC
  Administered 2014-03-06 – 2014-03-07 (×2): 2 g via INTRAVENOUS
  Filled 2014-03-06 (×4): qty 50

## 2014-03-06 MED ORDER — SUCCINYLCHOLINE CHLORIDE 20 MG/ML IJ SOLN
INTRAMUSCULAR | Status: AC | PRN
  Administered 2014-03-06: 100 mg via INTRAVENOUS

## 2014-03-06 MED ORDER — SODIUM CHLORIDE 0.9 % IV BOLUS (SEPSIS)
1000.0000 mL | Freq: Once | INTRAVENOUS | Status: AC
  Administered 2014-03-06: 1000 mL via INTRAVENOUS

## 2014-03-06 MED ORDER — CETYLPYRIDINIUM CHLORIDE 0.05 % MT LIQD
7.0000 mL | Freq: Four times a day (QID) | OROMUCOSAL | Status: DC
  Administered 2014-03-07 – 2014-03-12 (×24): 7 mL via OROMUCOSAL

## 2014-03-06 MED ORDER — KETAMINE HCL 10 MG/ML IJ SOLN: 0.5000 mg/kg | Freq: Once | INTRAMUSCULAR | Status: DC

## 2014-03-06 MED ORDER — LACTATED RINGERS IV BOLUS (SEPSIS): 1000.0000 mL | Freq: Once | INTRAVENOUS | Status: DC

## 2014-03-06 MED ORDER — TRANEXAMIC ACID 100 MG/ML IV SOLN
1000.0000 mg | Freq: Once | INTRAVENOUS | Status: AC
  Administered 2014-03-06: 1000 mg via INTRAVENOUS
  Filled 2014-03-06: qty 10

## 2014-03-06 MED ORDER — ETOMIDATE 2 MG/ML IV SOLN
INTRAVENOUS | Status: AC
  Filled 2014-03-06: qty 20

## 2014-03-06 MED ORDER — CHLORHEXIDINE GLUCONATE 0.12 % MT SOLN
15.0000 mL | Freq: Two times a day (BID) | OROMUCOSAL | Status: DC
  Administered 2014-03-06 – 2014-03-15 (×18): 15 mL via OROMUCOSAL
  Filled 2014-03-06 (×18): qty 15

## 2014-03-06 MED ORDER — LIDOCAINE HCL (CARDIAC) 20 MG/ML IV SOLN
INTRAVENOUS | Status: AC
  Filled 2014-03-06: qty 5

## 2014-03-06 MED ORDER — ROCURONIUM BROMIDE 50 MG/5ML IV SOLN
INTRAVENOUS | Status: AC
Start: 2014-03-06 — End: 2014-03-07
  Filled 2014-03-06: qty 2

## 2014-03-06 MED ORDER — TETANUS-DIPHTH-ACELL PERTUSSIS 5-2.5-18.5 LF-MCG/0.5 IM SUSP
0.5000 mL | Freq: Once | INTRAMUSCULAR | Status: AC
  Administered 2014-03-06: 0.5 mL via INTRAMUSCULAR

## 2014-03-06 MED ORDER — DEXTROSE IN LACTATED RINGERS 5 % IV SOLN
INTRAVENOUS | Status: DC
  Administered 2014-03-06 – 2014-03-13 (×12): via INTRAVENOUS
  Administered 2014-03-14: 100 mL/h via INTRAVENOUS

## 2014-03-06 MED ORDER — ALBUMIN HUMAN 5 % IV SOLN
25.0000 g | Freq: Once | INTRAVENOUS | Status: AC
  Administered 2014-03-06: 25 g via INTRAVENOUS
  Filled 2014-03-06: qty 500

## 2014-03-06 NOTE — ED Notes (Signed)
4th unit of plasma [id number]. Volume 213

## 2014-03-06 NOTE — ED Notes (Signed)
[place]

## 2014-03-06 NOTE — ED Notes (Signed)
2nd bag blood infusing.

## 2014-03-06 NOTE — ED Notes (Signed)
5th unit plasma [id number]. Volume 300
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
## 2014-03-06 NOTE — ED Notes (Signed)
Dr Kathrynn Humble intubating at current.
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
## 2014-03-06 NOTE — ED Notes (Addendum)
6th unit of blood given [id number]. Volume 335.

## 2014-03-06 NOTE — ED Notes (Addendum)
3rd unit of plasma [id number]. Volume 235.

## 2014-03-06 NOTE — ED Notes (Signed)
2nd unit of plasma given [id number]. Volume 238

## 2014-03-06 NOTE — Procedures (Signed)
Central Venous Catheter Insertion Procedure Note Shawn Ramsey [id number] 02/01/1981  Procedure: Insertion of Central Venous Catheter Indications: Drug and/or fluid administration  Procedure Details Consent: Emergency consent Time Out: Verified patient identification, verified procedure, site/side was marked, verified correct patient position, special equipment/implants available, medications/allergies/relevent history reviewed, required imaging and test results available.  Performed  Maximum sterile technique was used including antiseptics, cap, gloves, gown, hand hygiene, mask and sheet. Skin prep: Chlorhexidine; local anesthetic administered A antimicrobial bonded/coated triple lumen catheter was placed in the left subclavian vein using the Seldinger technique.  Evaluation Blood flow good Complications: No apparent complications Patient did tolerate procedure well. Chest X-ray ordered to verify placement.  CXR: pending.  Shawn Ramsey E 03/06/2014, 3:20 PM

## 2014-03-06 NOTE — ED Notes (Signed)
Patient transported to CT 

## 2014-03-06 NOTE — ED Notes (Signed)
2nd unit of emergency release blood [id number]. Volume 335.

## 2014-03-06 NOTE — ED Notes (Signed)
Dr. Grandville Silos at bedside placing central line.

## 2014-03-06 NOTE — ED Notes (Signed)
Anesthesia is on their way for difficult intubation.

## 2014-03-06 NOTE — ED Notes (Signed)
3rd unit of blood [id number] 15 [id number]. Volume 285
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
## 2014-03-06 NOTE — ED Notes (Signed)
Chem 8 results given to Dr. Grandville Silos

## 2014-03-06 NOTE — ED Notes (Signed)
Ist unit of emergency release blood. [id number] 15 [id number]. Volume 278, O-neg.

## 2014-03-06 NOTE — ED Notes (Signed)
6th unit of plasma given [id number] 16 [id number]. Volume 194.

## 2014-03-06 NOTE — ED Notes (Signed)
2nd unit of plasma starting.

## 2014-03-06 NOTE — Procedures (Signed)
Arterial Catheter Insertion Procedure Note Shawn Ramsey [id number] 03-20-1980  Procedure: Insertion of Arterial Catheter  Indications: Blood pressure monitoring and Frequent blood sampling  Procedure Details Consent: Risks of procedure as well as the alternatives and risks of each were explained to the (patient/caregiver).  Consent for procedure obtained. and Unable to obtain consent because of emergent medical necessity. Time Out: Verified patient identification, verified procedure, site/side was marked, verified correct patient position, special equipment/implants available, medications/allergies/relevent history reviewed, required imaging and test results available.  Performed  Maximum sterile technique was used including antiseptics, cap, gloves, gown, hand hygiene, mask and sheet. Skin prep: Chlorhexidine; local anesthetic administered 20 gauge catheter was inserted into left radial artery using the Seldinger technique.  Evaluation Blood flow good; BP tracing good. Complications: No apparent complications.   Shawn Ramsey 03/06/2014

## 2014-03-06 NOTE — ED Notes (Addendum)
4th unit of blood [id number] 15 [id number]. Volume 283

## 2014-03-06 NOTE — ED Notes (Signed)
Dr. Grandville Silos ordering LEVEL 1 infuser to begin. Emergency Release Blood.
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
## 2014-03-06 NOTE — ED Notes (Signed)
1st unit of Plasma given [id number]. Volume 308.

## 2014-03-06 NOTE — ED Notes (Addendum)
5th unit of blood. [id number]. Volume 335.
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
## 2014-03-07 ENCOUNTER — Encounter (HOSPITAL_COMMUNITY): Payer: Self-pay | Admitting: Neurology

## 2014-03-07 ENCOUNTER — Inpatient Hospital Stay (HOSPITAL_COMMUNITY): Payer: Medicaid Other

## 2014-03-07 DIAGNOSIS — J9601 Acute respiratory failure with hypoxia: Secondary | ICD-10-CM

## 2014-03-07 DIAGNOSIS — T148 Other injury of unspecified body region: Secondary | ICD-10-CM

## 2014-03-07 DIAGNOSIS — S8990XA Unspecified injury of unspecified lower leg, initial encounter: Secondary | ICD-10-CM

## 2014-03-07 DIAGNOSIS — W3400XA Accidental discharge from unspecified firearms or gun, initial encounter: Secondary | ICD-10-CM

## 2014-03-07 LAB — URINALYSIS, ROUTINE W REFLEX MICROSCOPIC
Bilirubin Urine: NEGATIVE
Glucose, UA: NEGATIVE mg/dL
Ketones, ur: NEGATIVE mg/dL
Nitrite: NEGATIVE
Protein, ur: 100 mg/dL — AB
Specific Gravity, Urine: 1.021 (ref 1.005–1.030)
Urobilinogen, UA: 0.2 mg/dL (ref 0.0–1.0)
pH: 5.5 (ref 5.0–8.0)

## 2014-03-07 LAB — PREPARE FRESH FROZEN PLASMA
Unit division: 0
Unit division: 0
Unit division: 0
Unit division: 0
Unit division: 0
Unit division: 0
Unit division: 0
Unit division: 0
Unit division: 0
Unit division: 0
Unit division: 0

## 2014-03-07 LAB — BLOOD GAS, ARTERIAL
Acid-base deficit: 1.8 mmol/L (ref 0.0–2.0)
Bicarbonate: 22.6 mEq/L (ref 20.0–24.0)
Drawn by: 39898
FIO2: 0.7 %
MECHVT: 600 mL
O2 Saturation: 97.6 %
PEEP: 8 cmH2O
Patient temperature: 100
RATE: 24 resp/min
TCO2: 23.8 mmol/L (ref 0–100)
pCO2 arterial: 40.8 mmHg (ref 35.0–45.0)
pH, Arterial: 7.367 (ref 7.350–7.450)
pO2, Arterial: 109 mmHg — ABNORMAL HIGH (ref 80.0–100.0)

## 2014-03-07 LAB — PROTIME-INR
INR: 1.56 — ABNORMAL HIGH (ref 0.00–1.49)
Prothrombin Time: 18.8 seconds — ABNORMAL HIGH (ref 11.6–15.2)

## 2014-03-07 LAB — CBC
HCT: 35.6 % — ABNORMAL LOW (ref 39.0–52.0)
HCT: 35.5 % — ABNORMAL LOW (ref 39.0–52.0)
HCT: 36 % — ABNORMAL LOW (ref 39.0–52.0)
Hemoglobin: 12.3 g/dL — ABNORMAL LOW (ref 13.0–17.0)
Hemoglobin: 12.2 g/dL — ABNORMAL LOW (ref 13.0–17.0)
Hemoglobin: 12.2 g/dL — ABNORMAL LOW (ref 13.0–17.0)
MCH: 29.4 pg (ref 26.0–34.0)
MCH: 28.6 pg (ref 26.0–34.0)
MCH: 28.6 pg (ref 26.0–34.0)
MCHC: 34.6 g/dL (ref 30.0–36.0)
MCHC: 34.4 g/dL (ref 30.0–36.0)
MCHC: 33.9 g/dL (ref 30.0–36.0)
MCV: 85 fL (ref 78.0–100.0)
MCV: 83.1 fL (ref 78.0–100.0)
MCV: 84.5 fL (ref 78.0–100.0)
Platelets: 151 10*3/uL (ref 150–400)
Platelets: 135 10*3/uL — ABNORMAL LOW (ref 150–400)
Platelets: 118 10*3/uL — ABNORMAL LOW (ref 150–400)
RBC: 4.19 MIL/uL — ABNORMAL LOW (ref 4.22–5.81)
RBC: 4.27 MIL/uL (ref 4.22–5.81)
RBC: 4.26 MIL/uL (ref 4.22–5.81)
RDW: 16.2 % — ABNORMAL HIGH (ref 11.5–15.5)
RDW: 16.2 % — ABNORMAL HIGH (ref 11.5–15.5)
RDW: 16.2 % — ABNORMAL HIGH (ref 11.5–15.5)
WBC: 12.8 10*3/uL — ABNORMAL HIGH (ref 4.0–10.5)
WBC: 9.4 10*3/uL (ref 4.0–10.5)
WBC: 7.7 10*3/uL (ref 4.0–10.5)

## 2014-03-07 LAB — BASIC METABOLIC PANEL
Anion gap: 9 (ref 5–15)
BUN: 8 mg/dL (ref 6–23)
CO2: 25 mmol/L (ref 19–32)
Calcium: 7.3 mg/dL — ABNORMAL LOW (ref 8.4–10.5)
Chloride: 109 mEq/L (ref 96–112)
Creatinine, Ser: 1.22 mg/dL (ref 0.50–1.35)
GFR calc Af Amer: 89 mL/min — ABNORMAL LOW (ref >90)
GFR calc non Af Amer: 77 mL/min — ABNORMAL LOW (ref >90)
Glucose, Bld: 127 mg/dL — ABNORMAL HIGH (ref 70–99)
Potassium: 3.6 mmol/L (ref 3.5–5.1)
Sodium: 143 mmol/L (ref 135–145)

## 2014-03-07 LAB — URINE MICROSCOPIC-ADD ON

## 2014-03-07 MED ORDER — INFLUENZA VAC SPLIT QUAD 0.5 ML IM SUSY
0.5000 mL | PREFILLED_SYRINGE | INTRAMUSCULAR | Status: AC
  Administered 2014-03-08: 0.5 mL via INTRAMUSCULAR
  Filled 2014-03-07: qty 0.5

## 2014-03-07 MED ORDER — CEFAZOLIN SODIUM-DEXTROSE 2-3 GM-% IV SOLR
2.0000 g | Freq: Three times a day (TID) | INTRAVENOUS | Status: AC
  Administered 2014-03-07 – 2014-03-09 (×6): 2 g via INTRAVENOUS
  Filled 2014-03-07 (×6): qty 50

## 2014-03-07 MED ORDER — IPRATROPIUM-ALBUTEROL 0.5-2.5 (3) MG/3ML IN SOLN
3.0000 mL | Freq: Three times a day (TID) | RESPIRATORY_TRACT | Status: DC
  Administered 2014-03-07 – 2014-03-15 (×24): 3 mL via RESPIRATORY_TRACT
  Filled 2014-03-07 (×24): qty 3

## 2014-03-07 MED ORDER — PNEUMOCOCCAL VAC POLYVALENT 25 MCG/0.5ML IJ INJ
0.5000 mL | INJECTION | INTRAMUSCULAR | Status: AC
  Administered 2014-03-08: 0.5 mL via INTRAMUSCULAR
  Filled 2014-03-07: qty 0.5

## 2014-03-07 MED ORDER — ACETAMINOPHEN 160 MG/5ML PO SOLN
650.0000 mg | Freq: Four times a day (QID) | ORAL | Status: DC | PRN
  Administered 2014-03-07 – 2014-03-10 (×6): 650 mg via ORAL
  Filled 2014-03-07 (×6): qty 20.3

## 2014-03-07 MED ORDER — SODIUM CHLORIDE 0.9 % IV SOLN
1.0000 g | Freq: Once | INTRAVENOUS | Status: AC
  Administered 2014-03-07: 1 g via INTRAVENOUS
  Filled 2014-03-07: qty 10
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
## 2014-03-07 NOTE — Progress Notes (Signed)
Chaplain introduced herself to pt family in waiting area. Chaplain offered to keep family in her prayers. Chaplain will continue to follow.   03/07/14 1500  Clinical Encounter Type  Visited With Family  Visit Type Initial;Spiritual support  Spiritual Encounters  Spiritual Needs Emotional;Prayer  Shawn Ramsey 03/07/2014 3:53 PM

## 2014-03-07 NOTE — Progress Notes (Signed)
Orthopedic Tech Progress Note Patient Details:  Shawn Ramsey 11-16-1980 [id number]  Ortho Devices Type of Ortho Device: Ace wrap, Doran Durand splint Ortho Device/Splint Location: LLE Ortho Device/Splint Interventions: Ordered, Application   Braulio Bosch 03/07/2014, 4:40 PM

## 2014-03-07 NOTE — Progress Notes (Signed)
UR completed.  Tahsin Benyo, RN BSN MHA CCM Trauma/Neuro ICU Case Manager [phone]  
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
## 2014-03-08 ENCOUNTER — Inpatient Hospital Stay (HOSPITAL_COMMUNITY): Payer: Medicaid Other

## 2014-03-08 DIAGNOSIS — T148 Other injury of unspecified body region: Secondary | ICD-10-CM

## 2014-03-08 DIAGNOSIS — W3400XA Accidental discharge from unspecified firearms or gun, initial encounter: Secondary | ICD-10-CM

## 2014-03-08 DIAGNOSIS — J9601 Acute respiratory failure with hypoxia: Secondary | ICD-10-CM

## 2014-03-08 LAB — CBC
HCT: 37.4 % — ABNORMAL LOW (ref 39.0–52.0)
HCT: 33.9 % — ABNORMAL LOW (ref 39.0–52.0)
Hemoglobin: 12.7 g/dL — ABNORMAL LOW (ref 13.0–17.0)
Hemoglobin: 11.4 g/dL — ABNORMAL LOW (ref 13.0–17.0)
MCH: 28.9 pg (ref 26.0–34.0)
MCH: 28.4 pg (ref 26.0–34.0)
MCHC: 34 g/dL (ref 30.0–36.0)
MCHC: 33.6 g/dL (ref 30.0–36.0)
MCV: 85 fL (ref 78.0–100.0)
MCV: 84.3 fL (ref 78.0–100.0)
Platelets: 115 10*3/uL — ABNORMAL LOW (ref 150–400)
Platelets: 109 10*3/uL — ABNORMAL LOW (ref 150–400)
RBC: 4.4 MIL/uL (ref 4.22–5.81)
RBC: 4.02 MIL/uL — ABNORMAL LOW (ref 4.22–5.81)
RDW: 16.4 % — ABNORMAL HIGH (ref 11.5–15.5)
RDW: 16.2 % — ABNORMAL HIGH (ref 11.5–15.5)
WBC: 8.3 10*3/uL (ref 4.0–10.5)
WBC: 8.2 10*3/uL (ref 4.0–10.5)

## 2014-03-08 LAB — GLUCOSE, CAPILLARY
Glucose-Capillary: 84 mg/dL (ref 70–99)
Glucose-Capillary: 98 mg/dL (ref 70–99)
Glucose-Capillary: 104 mg/dL — ABNORMAL HIGH (ref 70–99)

## 2014-03-08 LAB — BASIC METABOLIC PANEL
Anion gap: 8 (ref 5–15)
BUN: 7 mg/dL (ref 6–23)
CO2: 25 mmol/L (ref 19–32)
Calcium: 8 mg/dL — ABNORMAL LOW (ref 8.4–10.5)
Chloride: 111 mEq/L (ref 96–112)
Creatinine, Ser: 1.28 mg/dL (ref 0.50–1.35)
GFR calc Af Amer: 84 mL/min — ABNORMAL LOW (ref >90)
GFR calc non Af Amer: 72 mL/min — ABNORMAL LOW (ref >90)
Glucose, Bld: 125 mg/dL — ABNORMAL HIGH (ref 70–99)
Potassium: 3.2 mmol/L — ABNORMAL LOW (ref 3.5–5.1)
Sodium: 144 mmol/L (ref 135–145)

## 2014-03-08 MED ORDER — PIVOT 1.5 CAL PO LIQD
1000.0000 mL | ORAL | Status: DC
  Administered 2014-03-08 – 2014-03-11 (×2): 1000 mL
  Filled 2014-03-08 (×8): qty 1000

## 2014-03-08 MED ORDER — PRO-STAT SUGAR FREE PO LIQD
60.0000 mL | Freq: Four times a day (QID) | ORAL | Status: DC
  Administered 2014-03-08 – 2014-03-11 (×9): 60 mL
  Filled 2014-03-08 (×28): qty 60

## 2014-03-08 MED ORDER — PIVOT 1.5 CAL PO LIQD: 1000.0000 mL | ORAL | Status: DC

## 2014-03-08 MED ORDER — BUDESONIDE 0.5 MG/2ML IN SUSP
0.5000 mg | Freq: Two times a day (BID) | RESPIRATORY_TRACT | Status: DC
Start: 2014-03-08 — End: 2014-03-18
  Administered 2014-03-08 – 2014-03-18 (×20): 0.5 mg via RESPIRATORY_TRACT
  Filled 2014-03-08 (×25): qty 2

## 2014-03-08 MED ORDER — POTASSIUM CHLORIDE 10 MEQ/50ML IV SOLN
10.0000 meq | INTRAVENOUS | Status: AC
  Administered 2014-03-08 (×6): 10 meq via INTRAVENOUS
  Filled 2014-03-08: qty 50

## 2014-03-08 MED ORDER — VITAMIN K1 10 MG/ML IJ SOLN
10.0000 mg | Freq: Once | INTRAMUSCULAR | Status: AC
  Administered 2014-03-08: 10 mg via INTRAVENOUS
  Filled 2014-03-08: qty 1

## 2014-03-08 MED ORDER — ENOXAPARIN SODIUM 40 MG/0.4ML ~~LOC~~ SOLN
40.0000 mg | SUBCUTANEOUS | Status: DC
  Administered 2014-03-08 – 2014-03-14 (×7): 40 mg via SUBCUTANEOUS
  Filled 2014-03-08 (×8): qty 0.4
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
## 2014-03-08 NOTE — Progress Notes (Signed)
[place] Progress Note Patient Name: Shawn Ramsey DOB: [birth year] MRN: [id number]   Date of Service  03/08/2014  HPI/Events of Note  Hypokalemia  eICU Interventions  Potassium replaced     Intervention Category Intermediate Interventions: Electrolyte abnormality - evaluation and management  Abril Cappiello 03/08/2014, 12:52 AM

## 2014-03-09 ENCOUNTER — Inpatient Hospital Stay (HOSPITAL_COMMUNITY): Payer: Medicaid Other

## 2014-03-09 DIAGNOSIS — E876 Hypokalemia: Secondary | ICD-10-CM

## 2014-03-09 LAB — GLUCOSE, CAPILLARY
Glucose-Capillary: 102 mg/dL — ABNORMAL HIGH (ref 70–99)
Glucose-Capillary: 103 mg/dL — ABNORMAL HIGH (ref 70–99)
Glucose-Capillary: 112 mg/dL — ABNORMAL HIGH (ref 70–99)
Glucose-Capillary: 85 mg/dL (ref 70–99)
Glucose-Capillary: 87 mg/dL (ref 70–99)
Glucose-Capillary: 92 mg/dL (ref 70–99)

## 2014-03-09 LAB — CBC
HCT: 31.9 % — ABNORMAL LOW (ref 39.0–52.0)
Hemoglobin: 10.7 g/dL — ABNORMAL LOW (ref 13.0–17.0)
MCH: 28.5 pg (ref 26.0–34.0)
MCHC: 33.5 g/dL (ref 30.0–36.0)
MCV: 85.1 fL (ref 78.0–100.0)
Platelets: 117 10*3/uL — ABNORMAL LOW (ref 150–400)
RBC: 3.75 MIL/uL — ABNORMAL LOW (ref 4.22–5.81)
RDW: 16 % — ABNORMAL HIGH (ref 11.5–15.5)
WBC: 8.9 10*3/uL (ref 4.0–10.5)

## 2014-03-09 LAB — PHOSPHORUS: Phosphorus: 2 mg/dL — ABNORMAL LOW (ref 2.3–4.6)

## 2014-03-09 LAB — BLOOD GAS, ARTERIAL
Acid-Base Excess: 1.1 mmol/L (ref 0.0–2.0)
Bicarbonate: 25.1 mEq/L — ABNORMAL HIGH (ref 20.0–24.0)
Drawn by: 312761
FIO2: 0.4 %
MECHVT: 600 mL
O2 Saturation: 96.3 %
PEEP: 5 cmH2O
Patient temperature: 98.6
RATE: 24 resp/min
TCO2: 26.3 mmol/L (ref 0–100)
pCO2 arterial: 39.5 mmHg (ref 35.0–45.0)
pH, Arterial: 7.42 (ref 7.350–7.450)
pO2, Arterial: 75.4 mmHg — ABNORMAL LOW (ref 80.0–100.0)

## 2014-03-09 LAB — BASIC METABOLIC PANEL
Anion gap: 5 (ref 5–15)
BUN: 11 mg/dL (ref 6–23)
CO2: 27 mmol/L (ref 19–32)
Calcium: 8.3 mg/dL — ABNORMAL LOW (ref 8.4–10.5)
Chloride: 112 mEq/L (ref 96–112)
Creatinine, Ser: 1.28 mg/dL (ref 0.50–1.35)
GFR calc Af Amer: 84 mL/min — ABNORMAL LOW (ref >90)
GFR calc non Af Amer: 72 mL/min — ABNORMAL LOW (ref >90)
Glucose, Bld: 110 mg/dL — ABNORMAL HIGH (ref 70–99)
Potassium: 3.3 mmol/L — ABNORMAL LOW (ref 3.5–5.1)
Sodium: 144 mmol/L (ref 135–145)

## 2014-03-09 LAB — URINE CULTURE
Colony Count: NO GROWTH
Culture: NO GROWTH

## 2014-03-09 LAB — MAGNESIUM: Magnesium: 1.8 mg/dL (ref 1.5–2.5)

## 2014-03-09 MED ORDER — POTASSIUM CHLORIDE 20 MEQ/15ML (10%) PO SOLN
40.0000 meq | Freq: Once | ORAL | Status: AC
  Administered 2014-03-09: 40 meq
  Filled 2014-03-09: qty 30

## 2014-03-09 MED ORDER — POTASSIUM PHOSPHATES 15 MMOLE/5ML IV SOLN
30.0000 mmol | Freq: Once | INTRAVENOUS | Status: AC
  Administered 2014-03-09: 30 mmol via INTRAVENOUS
  Filled 2014-03-09: qty 10

## 2014-03-09 MED ORDER — MAGNESIUM SULFATE IN D5W 10-5 MG/ML-% IV SOLN
1.0000 g | Freq: Once | INTRAVENOUS | Status: AC
  Administered 2014-03-09: 1 g via INTRAVENOUS
  Filled 2014-03-09: qty 100

## 2014-03-09 NOTE — Progress Notes (Signed)
Aline dislodged when I entered room. Unable to re-establish proper placement at this time. Catheter removed and pressure dressing placed. RN notified.
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
## 2014-03-09 NOTE — Progress Notes (Signed)
[facility] Progress Note Patient Name: Shawn Ramsey DOB: May 31, 1980 MRN: [id number]   Date of Service  03/09/2014  HPI/Events of Note  Hypokalemia, hypophosphatemia and low normal Mag  eICU Interventions  Potassium, Phos, and Mag replaced     Intervention Category Intermediate Interventions: Electrolyte abnormality - evaluation and management  Stephany Poorman 03/09/2014, 3:32 AM
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
## 2014-03-10 ENCOUNTER — Inpatient Hospital Stay (HOSPITAL_COMMUNITY): Payer: Medicaid Other

## 2014-03-10 ENCOUNTER — Encounter (HOSPITAL_COMMUNITY): Payer: Self-pay | Admitting: Certified Registered Nurse Anesthetist

## 2014-03-10 DIAGNOSIS — R0989 Other specified symptoms and signs involving the circulatory and respiratory systems: Secondary | ICD-10-CM

## 2014-03-10 DIAGNOSIS — J9600 Acute respiratory failure, unspecified whether with hypoxia or hypercapnia: Secondary | ICD-10-CM

## 2014-03-10 DIAGNOSIS — J810 Acute pulmonary edema: Secondary | ICD-10-CM

## 2014-03-10 LAB — TYPE AND SCREEN
ABO/RH(D): O POS
Antibody Screen: NEGATIVE
Unit division: 0
Unit division: 0
Unit division: 0
Unit division: 0
Unit division: 0
Unit division: 0
Unit division: 0
Unit division: 0
Unit division: 0
Unit division: 0
Unit division: 0
Unit division: 0
Unit division: 0
Unit division: 0
Unit division: 0
Unit division: 0

## 2014-03-10 LAB — BLOOD GAS, ARTERIAL
Acid-Base Excess: 1.3 mmol/L (ref 0.0–2.0)
Bicarbonate: 25.5 mEq/L — ABNORMAL HIGH (ref 20.0–24.0)
Drawn by: 31276
FIO2: 0.4 %
MECHVT: 600 mL
O2 Saturation: 93.7 %
PEEP: 5 cmH2O
Patient temperature: 98.6
RATE: 24 resp/min
TCO2: 26.7 mmol/L (ref 0–100)
pCO2 arterial: 40.8 mmHg (ref 35.0–45.0)
pH, Arterial: 7.412 (ref 7.350–7.450)
pO2, Arterial: 64 mmHg — ABNORMAL LOW (ref 80.0–100.0)

## 2014-03-10 LAB — CBC
HCT: 30.4 % — ABNORMAL LOW (ref 39.0–52.0)
Hemoglobin: 10.2 g/dL — ABNORMAL LOW (ref 13.0–17.0)
MCH: 29.3 pg (ref 26.0–34.0)
MCHC: 33.6 g/dL (ref 30.0–36.0)
MCV: 87.4 fL (ref 78.0–100.0)
Platelets: 145 10*3/uL — ABNORMAL LOW (ref 150–400)
RBC: 3.48 MIL/uL — ABNORMAL LOW (ref 4.22–5.81)
RDW: 15.5 % (ref 11.5–15.5)
WBC: 8.3 10*3/uL (ref 4.0–10.5)

## 2014-03-10 LAB — GLUCOSE, CAPILLARY
Glucose-Capillary: 87 mg/dL (ref 70–99)
Glucose-Capillary: 95 mg/dL (ref 70–99)
Glucose-Capillary: 92 mg/dL (ref 70–99)
Glucose-Capillary: 101 mg/dL — ABNORMAL HIGH (ref 70–99)
Glucose-Capillary: 89 mg/dL (ref 70–99)
Glucose-Capillary: 87 mg/dL (ref 70–99)

## 2014-03-10 LAB — BASIC METABOLIC PANEL
Anion gap: 3 — ABNORMAL LOW (ref 5–15)
BUN: 13 mg/dL (ref 6–23)
CO2: 30 mmol/L (ref 19–32)
Calcium: 8.5 mg/dL (ref 8.4–10.5)
Chloride: 111 mEq/L (ref 96–112)
Creatinine, Ser: 1.14 mg/dL (ref 0.50–1.35)
GFR calc Af Amer: >90 mL/min (ref >90)
GFR calc non Af Amer: 83 mL/min — ABNORMAL LOW (ref >90)
Glucose, Bld: 99 mg/dL (ref 70–99)
Potassium: 3.2 mmol/L — ABNORMAL LOW (ref 3.5–5.1)
Sodium: 144 mmol/L (ref 135–145)

## 2014-03-10 LAB — MAGNESIUM: Magnesium: 2 mg/dL (ref 1.5–2.5)

## 2014-03-10 LAB — BLOOD PRODUCT ORDER (VERBAL) VERIFICATION

## 2014-03-10 LAB — PHOSPHORUS: Phosphorus: 2.5 mg/dL (ref 2.3–4.6)

## 2014-03-10 MED ORDER — DEXMEDETOMIDINE HCL IN NACL 200 MCG/50ML IV SOLN
0.0000 ug/kg/h | INTRAVENOUS | Status: AC
  Administered 2014-03-10 (×2): 0.4 ug/kg/h via INTRAVENOUS
  Administered 2014-03-10: 0.2 ug/kg/h via INTRAVENOUS
  Administered 2014-03-11: 0.6 ug/kg/h via INTRAVENOUS
  Administered 2014-03-11: 0.4 ug/kg/h via INTRAVENOUS
  Administered 2014-03-11 (×2): 0.5 ug/kg/h via INTRAVENOUS
  Administered 2014-03-11: 0.6 ug/kg/h via INTRAVENOUS
  Administered 2014-03-11 (×2): 0.5 ug/kg/h via INTRAVENOUS
  Administered 2014-03-12 (×3): 0.8 ug/kg/h via INTRAVENOUS
  Filled 2014-03-10 (×14): qty 50

## 2014-03-10 MED ORDER — ROCURONIUM BROMIDE 50 MG/5ML IV SOLN
50.0000 mg | Freq: Once | INTRAVENOUS | Status: AC
  Administered 2014-03-10: 50 mg via INTRAVENOUS

## 2014-03-10 MED ORDER — POTASSIUM CHLORIDE 20 MEQ/15ML (10%) PO SOLN
30.0000 meq | ORAL | Status: AC
  Administered 2014-03-10 (×2): 30 meq
  Filled 2014-03-10 (×2): qty 22.5

## 2014-03-10 MED ORDER — FENTANYL CITRATE 0.05 MG/ML IJ SOLN
200.0000 ug | Freq: Once | INTRAMUSCULAR | Status: AC
  Administered 2014-03-10: 200 ug via INTRAVENOUS

## 2014-03-10 MED ORDER — MIDAZOLAM HCL 2 MG/2ML IJ SOLN
4.0000 mg | Freq: Once | INTRAMUSCULAR | Status: AC
  Administered 2014-03-10: 4 mg via INTRAVENOUS

## 2014-03-10 MED ORDER — FENTANYL CITRATE 0.05 MG/ML IJ SOLN
INTRAMUSCULAR | Status: AC
  Administered 2014-03-10: 200 ug via INTRAVENOUS
  Filled 2014-03-10: qty 4

## 2014-03-10 MED ORDER — ETOMIDATE 2 MG/ML IV SOLN
20.0000 mg | Freq: Once | INTRAVENOUS | Status: AC
  Administered 2014-03-10: 20 mg via INTRAVENOUS

## 2014-03-10 MED ORDER — POTASSIUM CHLORIDE 20 MEQ/15ML (10%) PO SOLN
ORAL | Status: AC
  Filled 2014-03-10: qty 30

## 2014-03-10 MED ORDER — DEXTROSE 5 % IV SOLN
30.0000 mmol | Freq: Once | INTRAVENOUS | Status: AC
  Administered 2014-03-10: 30 mmol via INTRAVENOUS
  Filled 2014-03-10: qty 10

## 2014-03-10 MED ORDER — MIDAZOLAM HCL 2 MG/2ML IJ SOLN
INTRAMUSCULAR | Status: AC
  Administered 2014-03-10: 4 mg via INTRAVENOUS
  Filled 2014-03-10: qty 4

## 2014-03-10 MED ORDER — FUROSEMIDE 10 MG/ML IJ SOLN
40.0000 mg | Freq: Three times a day (TID) | INTRAMUSCULAR | Status: AC
  Administered 2014-03-10 (×2): 40 mg via INTRAVENOUS
  Filled 2014-03-10 (×2): qty 4

## 2014-03-10 MED ORDER — FENTANYL BOLUS VIA INFUSION
50.0000 ug | INTRAVENOUS | Status: DC | PRN
  Filled 2014-03-10: qty 50

## 2014-03-10 MED ORDER — SODIUM CHLORIDE 0.9 % IV SOLN
25.0000 ug/h | INTRAVENOUS | Status: DC
  Administered 2014-03-11: 200 ug/h via INTRAVENOUS
  Administered 2014-03-11: 150 ug/h via INTRAVENOUS
  Filled 2014-03-10 (×3): qty 50

## 2014-03-10 MED ORDER — CEFAZOLIN SODIUM 10 G IJ SOLR
3.0000 g | INTRAMUSCULAR | Status: AC
  Administered 2014-03-11: 3 g via INTRAVENOUS
  Filled 2014-03-10: qty 3000
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
## 2014-03-10 NOTE — Progress Notes (Signed)
Patient coughed out ETT.  Was reintubated by MD.  No apparent complications.  RT will continue to monitor.
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
## 2014-03-10 NOTE — Procedures (Signed)
Intubation Procedure Note Shawn Ramsey [id number] 03/14/1980  Procedure: Intubation Indications: Airway protection and maintenance  Procedure Details Consent: Unable to obtain consent because of emergent medical necessity. Time Out: Verified patient identification, verified procedure, site/side was marked, verified correct patient position, special equipment/implants available, medications/allergies/relevent history reviewed, required imaging and test results available.  Performed  Maximum sterile technique was used including gloves, hand hygiene and mask.  MAC    Evaluation Hemodynamic Status: BP stable throughout; O2 sats: stable throughout Patient's Current Condition: stable Complications: No apparent complications Patient did tolerate procedure well. Chest X-ray ordered to verify placement.  CXR: pending.   Jennet Maduro 03/10/2014

## 2014-03-10 NOTE — Progress Notes (Signed)
VASCULAR LAB PRELIMINARY  ARTERIAL  ABI completed:    RIGHT    LEFT    PRESSURE WAVEFORM  PRESSURE WAVEFORM  BRACHIAL 151 triphasic BRACHIAL    DP   DP    AT 176 triphasic AT 163 triphasic  PT 183 triphasic PT 180 triphasic  PER   PER    GREAT TOE  NA GREAT TOE  NA    RIGHT LEFT  ABI >1.0 >1.0     Shawn Ramsey, RVT 03/10/2014, 2:55 PM

## 2014-03-10 NOTE — Progress Notes (Signed)
[facility] ADULT ICU REPLACEMENT PROTOCOL FOR AM LAB REPLACEMENT ONLY  The patient does apply for the [facility] Adult ICU Electrolyte Replacment Protocol based on the criteria listed below:   1. Is GFR >/= 40 ml/min? Yes.    Patient's GFR today is 89 2. Is urine output >/= 0.5 ml/kg/hr for the last 6 hours? Yes.   Patient's UOP is 1.1 ml/kg/hr 3. Is BUN < 60 mg/dL? Yes.    Patient's BUN today is 13 4. Abnormal electrolyte(s): K3.2 5. Ordered repletion with: Kcl/tube 6. If a panic level lab has been reported, has the CCM MD in charge been notified? Yes.  .   Physician:  Ann Lions MD  Vear Clock 03/10/2014 4:59 AM

## 2014-03-11 ENCOUNTER — Encounter (HOSPITAL_COMMUNITY): Admission: RE | Payer: Self-pay | Source: Ambulatory Visit

## 2014-03-11 ENCOUNTER — Inpatient Hospital Stay (HOSPITAL_COMMUNITY): Payer: Medicaid Other

## 2014-03-11 ENCOUNTER — Inpatient Hospital Stay (HOSPITAL_COMMUNITY): Payer: Medicaid Other | Admitting: Certified Registered Nurse Anesthetist

## 2014-03-11 ENCOUNTER — Inpatient Hospital Stay (HOSPITAL_COMMUNITY): Admission: RE | Admit: 2014-03-11 | Payer: Medicaid Other | Source: Ambulatory Visit | Admitting: Orthopedic Surgery

## 2014-03-11 ENCOUNTER — Encounter (HOSPITAL_COMMUNITY): Admission: EM | Disposition: A | Discharge status: Home Health | Payer: Self-pay | Source: Home / Self Care

## 2014-03-11 HISTORY — PX: PERCUTANEOUS PINNING: SHX2209

## 2014-03-11 LAB — CBC
HCT: 30.8 % — ABNORMAL LOW (ref 39.0–52.0)
HCT: 29.8 % — ABNORMAL LOW (ref 39.0–52.0)
Hemoglobin: 10.2 g/dL — ABNORMAL LOW (ref 13.0–17.0)
Hemoglobin: 10 g/dL — ABNORMAL LOW (ref 13.0–17.0)
MCH: 28.3 pg (ref 26.0–34.0)
MCH: 28.8 pg (ref 26.0–34.0)
MCHC: 33.1 g/dL (ref 30.0–36.0)
MCHC: 33.6 g/dL (ref 30.0–36.0)
MCV: 85.6 fL (ref 78.0–100.0)
MCV: 85.9 fL (ref 78.0–100.0)
Platelets: 172 10*3/uL (ref 150–400)
Platelets: 197 10*3/uL (ref 150–400)
RBC: 3.6 MIL/uL — ABNORMAL LOW (ref 4.22–5.81)
RBC: 3.47 MIL/uL — ABNORMAL LOW (ref 4.22–5.81)
RDW: 15.3 % (ref 11.5–15.5)
RDW: 15.3 % (ref 11.5–15.5)
WBC: 11 10*3/uL — ABNORMAL HIGH (ref 4.0–10.5)
WBC: 12.6 10*3/uL — ABNORMAL HIGH (ref 4.0–10.5)

## 2014-03-11 LAB — BLOOD GAS, ARTERIAL
Acid-Base Excess: 4 mmol/L — ABNORMAL HIGH (ref 0.0–2.0)
Bicarbonate: 27.4 mEq/L — ABNORMAL HIGH (ref 20.0–24.0)
Drawn by: 312761
FIO2: 0.6 %
MECHVT: 600 mL
O2 Saturation: 95.7 %
PEEP: 5 cmH2O
Patient temperature: 98.6
RATE: 24 resp/min
TCO2: 28.5 mmol/L (ref 0–100)
pCO2 arterial: 36.9 mmHg (ref 35.0–45.0)
pH, Arterial: 7.484 — ABNORMAL HIGH (ref 7.350–7.450)
pO2, Arterial: 77.7 mmHg — ABNORMAL LOW (ref 80.0–100.0)

## 2014-03-11 LAB — POCT I-STAT 7, (LYTES, BLD GAS, ICA,H+H)
Acid-Base Excess: 3 mmol/L — ABNORMAL HIGH (ref 0.0–2.0)
Bicarbonate: 27.8 meq/L — ABNORMAL HIGH (ref 20.0–24.0)
Calcium, Ion: 1.07 mmol/L — ABNORMAL LOW (ref 1.12–1.23)
HCT: 31 % — ABNORMAL LOW (ref 39.0–52.0)
Hemoglobin: 10.5 g/dL — ABNORMAL LOW (ref 13.0–17.0)
O2 Saturation: 96 %
Patient temperature: 36.5
Potassium: 3.4 mmol/L — ABNORMAL LOW (ref 3.5–5.1)
Sodium: 145 mmol/L (ref 135–145)
TCO2: 29 mmol/L (ref 0–100)
pCO2 arterial: 44.2 mmHg (ref 35.0–45.0)
pH, Arterial: 7.405 (ref 7.350–7.450)
pO2, Arterial: 81 mmHg (ref 80.0–100.0)

## 2014-03-11 LAB — BASIC METABOLIC PANEL
Anion gap: 10 (ref 5–15)
BUN: 15 mg/dL (ref 6–23)
CO2: 26 mmol/L (ref 19–32)
Calcium: 8.1 mg/dL — ABNORMAL LOW (ref 8.4–10.5)
Chloride: 108 mEq/L (ref 96–112)
Creatinine, Ser: 1.48 mg/dL — ABNORMAL HIGH (ref 0.50–1.35)
GFR calc Af Amer: 70 mL/min — ABNORMAL LOW (ref >90)
GFR calc non Af Amer: 61 mL/min — ABNORMAL LOW (ref >90)
Glucose, Bld: 115 mg/dL — ABNORMAL HIGH (ref 70–99)
Potassium: 3 mmol/L — ABNORMAL LOW (ref 3.5–5.1)
Sodium: 144 mmol/L (ref 135–145)

## 2014-03-11 LAB — GLUCOSE, CAPILLARY
Glucose-Capillary: 106 mg/dL — ABNORMAL HIGH (ref 70–99)
Glucose-Capillary: 112 mg/dL — ABNORMAL HIGH (ref 70–99)
Glucose-Capillary: 112 mg/dL — ABNORMAL HIGH (ref 70–99)
Glucose-Capillary: 116 mg/dL — ABNORMAL HIGH (ref 70–99)
Glucose-Capillary: 119 mg/dL — ABNORMAL HIGH (ref 70–99)
Glucose-Capillary: 87 mg/dL (ref 70–99)

## 2014-03-11 LAB — MAGNESIUM: Magnesium: 1.7 mg/dL (ref 1.5–2.5)

## 2014-03-11 LAB — PHOSPHORUS: Phosphorus: 5.3 mg/dL — ABNORMAL HIGH (ref 2.3–4.6)

## 2014-03-11 SURGERY — PINNING, EXTREMITY, PERCUTANEOUS
Anesthesia: General | Laterality: Left

## 2014-03-11 MED ORDER — MIDAZOLAM HCL 5 MG/5ML IJ SOLN
INTRAMUSCULAR | Status: DC | PRN
  Administered 2014-03-11: 2 mg via INTRAVENOUS

## 2014-03-11 MED ORDER — ROCURONIUM BROMIDE 100 MG/10ML IV SOLN
INTRAVENOUS | Status: DC | PRN
  Administered 2014-03-11 (×2): 50 mg via INTRAVENOUS

## 2014-03-11 MED ORDER — LACTATED RINGERS IV SOLN
INTRAVENOUS | Status: DC | PRN
  Administered 2014-03-11: 08:00:00 via INTRAVENOUS

## 2014-03-11 MED ORDER — 0.9 % SODIUM CHLORIDE (POUR BTL) OPTIME
TOPICAL | Status: DC | PRN
  Administered 2014-03-11: 1000 mL

## 2014-03-11 MED ORDER — DEXTROSE 5 % IV SOLN
3.0000 g | INTRAVENOUS | Status: DC
  Filled 2014-03-11: qty 3000

## 2014-03-11 MED ORDER — DEXMEDETOMIDINE HCL IN NACL 200 MCG/50ML IV SOLN
INTRAVENOUS | Status: DC | PRN
  Administered 2014-03-11: 0.5 ug/kg/h via INTRAVENOUS

## 2014-03-11 MED ORDER — FENTANYL CITRATE 0.05 MG/ML IJ SOLN
INTRAMUSCULAR | Status: DC | PRN
  Administered 2014-03-11: 100 ug via INTRAVENOUS

## 2014-03-11 MED ORDER — PROPOFOL 10 MG/ML IV BOLUS
INTRAVENOUS | Status: DC | PRN
  Administered 2014-03-11: 30 mg via INTRAVENOUS

## 2014-03-11 MED ORDER — POTASSIUM CHLORIDE 10 MEQ/50ML IV SOLN
10.0000 meq | INTRAVENOUS | Status: AC
  Administered 2014-03-11 (×3): 10 meq via INTRAVENOUS
  Filled 2014-03-11 (×2): qty 50

## 2014-03-11 MED ORDER — PHENYLEPHRINE HCL 10 MG/ML IJ SOLN
INTRAMUSCULAR | Status: DC | PRN
  Administered 2014-03-11 (×4): 80 ug via INTRAVENOUS

## 2014-03-11 MED ORDER — DEXTROSE 5 % IV SOLN
3.0000 g | Freq: Three times a day (TID) | INTRAVENOUS | Status: DC
  Filled 2014-03-11 (×3): qty 3000

## 2014-03-11 SURGICAL SUPPLY — 44 items
BANDAGE ELASTIC 3 VELCRO ST LF (GAUZE/BANDAGES/DRESSINGS) IMPLANT
BANDAGE ELASTIC 4 VELCRO ST LF (GAUZE/BANDAGES/DRESSINGS) IMPLANT
BENZOIN TINCTURE PRP APPL 2/3 (GAUZE/BANDAGES/DRESSINGS) IMPLANT
BLADE SURG ROTATE 9660 (MISCELLANEOUS) IMPLANT
BNDG ELASTIC 6X15 VLCR STRL LF (GAUZE/BANDAGES/DRESSINGS) ×2 IMPLANT
BNDG GAUZE ELAST 4 BULKY (GAUZE/BANDAGES/DRESSINGS) IMPLANT
BRUSH SCRUB DISP (MISCELLANEOUS) ×4 IMPLANT
COVER SURGICAL LIGHT HANDLE (MISCELLANEOUS) ×2 IMPLANT
CUFF TOURNIQUET SINGLE 18IN (TOURNIQUET CUFF) IMPLANT
CUFF TOURNIQUET SINGLE 24IN (TOURNIQUET CUFF) IMPLANT
DRAPE C-ARMOR (DRAPES) ×2 IMPLANT
DRSG EMULSION OIL 3X3 NADH (GAUZE/BANDAGES/DRESSINGS) IMPLANT
DRSG MEPILEX BORDER 4X4 (GAUZE/BANDAGES/DRESSINGS) ×2 IMPLANT
GAUZE SPONGE 4X4 12PLY STRL (GAUZE/BANDAGES/DRESSINGS) IMPLANT
GAUZE XEROFORM 1X8 LF (GAUZE/BANDAGES/DRESSINGS) IMPLANT
GLOVE BIO SURGEON STRL SZ7.5 (GLOVE) ×2 IMPLANT
GLOVE BIO SURGEON STRL SZ8 (GLOVE) ×2 IMPLANT
GLOVE BIOGEL PI IND STRL 7.5 (GLOVE) ×1 IMPLANT
GLOVE BIOGEL PI IND STRL 8 (GLOVE) ×1 IMPLANT
GLOVE BIOGEL PI INDICATOR 7.5 (GLOVE) ×1
GLOVE BIOGEL PI INDICATOR 8 (GLOVE) ×1
GOWN STRL REUS W/ TWL LRG LVL3 (GOWN DISPOSABLE) ×2 IMPLANT
GOWN STRL REUS W/ TWL XL LVL3 (GOWN DISPOSABLE) IMPLANT
GOWN STRL REUS W/TWL LRG LVL3 (GOWN DISPOSABLE) ×2
GOWN STRL REUS W/TWL XL LVL3 (GOWN DISPOSABLE)
KIT BASIN OR (CUSTOM PROCEDURE TRAY) ×2 IMPLANT
KIT ROOM TURNOVER OR (KITS) ×2 IMPLANT
MANIFOLD NEPTUNE II (INSTRUMENTS) IMPLANT
NS IRRIG 1000ML POUR BTL (IV SOLUTION) ×2 IMPLANT
PACK ORTHO EXTREMITY (CUSTOM PROCEDURE TRAY) ×2 IMPLANT
PAD ARMBOARD 7.5X6 YLW CONV (MISCELLANEOUS) ×4 IMPLANT
PIN GUIDE DRILL TIP 2.8X300 (DRILL) ×6 IMPLANT
SCREW CANN 70X40X6.5 (Screw) ×1 IMPLANT
SCREW CANNULATED 6.5X70MM (Screw) ×1 IMPLANT
SCREW CANNULATED 6.5X75MM (Screw) ×2 IMPLANT
SCREW CANNULATED 8.0X60MM (Screw) ×2 IMPLANT
STRIP CLOSURE SKIN 1/2X4 (GAUZE/BANDAGES/DRESSINGS) IMPLANT
SUT ETHILON 4 0 P 3 18 (SUTURE) IMPLANT
SUT ETHILON 5 0 P 3 18 (SUTURE)
SUT NYLON ETHILON 5-0 P-3 1X18 (SUTURE) IMPLANT
SUT PROLENE 4 0 P 3 18 (SUTURE) IMPLANT
TOWEL OR 17X24 6PK STRL BLUE (TOWEL DISPOSABLE) ×2 IMPLANT
TOWEL OR 17X26 10 PK STRL BLUE (TOWEL DISPOSABLE) ×4 IMPLANT
WATER STERILE IRR 1000ML POUR (IV SOLUTION) IMPLANT

## 2014-03-11 NOTE — Progress Notes (Signed)
UR completed.  Await extubation and medical stability for therapy evals and recommendations for best level of care following discharge.   Sandi Mariscal, RN BSN [place] CCM Trauma/Neuro ICU Case Manager [phone]
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
## 2014-03-11 NOTE — Progress Notes (Signed)
I have seen and examined the patient. I agree with the findings above.  I discussed with the patient's sister the risks and benefits of surgery for left tibial plateau, including the possibility of infection, nerve injury, vessel injury, wound breakdown, arthritis, symptomatic hardware, DVT/ PE, loss of motion, and need for further surgery among others.  She understood these risks and wished to proceed.   Rozanna Box, MD 03/11/2014 7:55 AM

## 2014-03-11 NOTE — Progress Notes (Signed)
ABI's Normal Palpable pulses No further vascular testing required. Please call with further questions   Annamarie Major [phone]

## 2014-03-11 NOTE — Anesthesia Preprocedure Evaluation (Addendum)
Anesthesia Evaluation  Patient identified by MRN, date of birth, ID band Patient unresponsive  General Assessment Comment:Pt sedated and on ventilator  Reviewed: Allergy & Precautions, NPO status , Patient's Chart, lab work & pertinent test results, Unable to perform ROS - Chart review only  Airway Mallampati: Intubated       Dental   Pulmonary asthma , Current Smoker,          Cardiovascular     Neuro/Psych    GI/Hepatic   Endo/Other    Renal/GU      Musculoskeletal   Abdominal   Peds  Hematology   Anesthesia Other Findings   Reproductive/Obstetrics                            Anesthesia Physical Anesthesia Plan  ASA: III  Anesthesia Plan: General   Post-op Pain Management:    Induction: Intravenous  Airway Management Planned: Oral ETT  Additional Equipment:   Intra-op Plan:   Post-operative Plan: Post-operative intubation/ventilation  Informed Consent: I have reviewed the patients History and Physical, chart, labs and discussed the procedure including the risks, benefits and alternatives for the proposed anesthesia with the patient or authorized representative who has indicated his/her understanding and acceptance.     Plan Discussed with: CRNA and Surgeon  Anesthesia Plan Comments:        Anesthesia Quick Evaluation

## 2014-03-11 NOTE — Anesthesia Postprocedure Evaluation (Signed)
Anesthesia Post Note  Patient: Shawn Ramsey  Procedure(s) Performed: Procedure(s) (LRB): PERCUTANEOUS SCREW FIXATION LEFT MEDIAL TIBIAL PLATEAU   (Left)  Anesthesia type: General  Patient location: ICU  Post pain: Pain level controlled  Post assessment: Post-op Vital signs reviewed  Last Vitals:  Filed Vitals:   03/11/14 1050  BP:   Pulse: 81  Temp:   Resp: 24    Post vital signs: stable  Level of consciousness: Patient remains intubated per anesthesia plan  Complications: No apparent anesthesia complications

## 2014-03-11 NOTE — Progress Notes (Signed)
Made Dr. Grandville Silos aware of patient vomiting. OG to low intermittent suction. Will continue to monitor

## 2014-03-11 NOTE — Transfer of Care (Signed)
Immediate Anesthesia Transfer of Care Note  Patient: Shawn Ramsey  Procedure(s) Performed: Procedure(s): PERCUTANEOUS SCREW FIXATION LEFT MEDIAL TIBIAL PLATEAU   (Left)  Patient Location: ICU  Anesthesia Type:General  Level of Consciousness: sedated and remains on ventilator   Airway & Oxygen Therapy: Patient remains intubated per anesthesia plan and Patient placed on Ventilator (see vital sign flow sheet for setting)  Post-op Assessment: report given to ICU RN and VSS stable and placed on the ventilator by respiratory   Post vital signs: Reviewed and stable  Complications: No apparent anesthesia complications
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
## 2014-03-11 NOTE — Clinical Documentation Improvement (Signed)
Supporting Information: Patient with AMS, confusion, agitated, and at times combative per 01/14 progress notes.    Possible Clinical Conditions: . Document the etiology of the altered mental status as: --Coma --Delirium (including drug-induced) --Drowsiness/somnolence --Stupor/semi-coma --Transient alteration of awareness --Encephalopathy Alcoholic Anoxic/hypoxic Drug-induced/toxic (specify drug) Hepatic Hypertensive Hypoglycemic Metabolic/septic Traumatic/post-concussion Wernicke Other (specify) . Document any associated diagnoses/conditions    Thank Sherian Maroon Documentation Specialist [phone] Kier [email or web address]

## 2014-03-11 NOTE — Clinical Documentation Improvement (Signed)
Supporting Information:  Labs: 01/14:  Lactic acid: 11.7.    Possible Clinical Conditions: . Bacteremia (positive blood cultures only) .Specify sepsis with UTI, versus UTI only . Sepsis-specify causative organism if known . Sepsis due to: --Device --Implant --Graft --Infusion --Abortion . Severe sepsis-sepsis with organ dysfunction --Specify organ dysfunction Respiratory failure Encephalopathy Acute kidney failure Other (specify) . SIRS (Systemic Inflammatory Response Syndrome --With or without organ dysfunction . Document septic shock if present . Document any associated diagnoses/conditions    Thank Sherian Maroon Documentation Specialist [phone] Abeer [email or web address]

## 2014-03-12 ENCOUNTER — Inpatient Hospital Stay (HOSPITAL_COMMUNITY): Payer: Medicaid Other

## 2014-03-12 LAB — COMPREHENSIVE METABOLIC PANEL
ALT: 340 U/L — ABNORMAL HIGH (ref 0–53)
AST: 82 U/L — ABNORMAL HIGH (ref 0–37)
Albumin: 2.5 g/dL — ABNORMAL LOW (ref 3.5–5.2)
Alkaline Phosphatase: 55 U/L (ref 39–117)
Anion gap: 9 (ref 5–15)
BUN: 18 mg/dL (ref 6–23)
CO2: 30 mmol/L (ref 19–32)
Calcium: 7.8 mg/dL — ABNORMAL LOW (ref 8.4–10.5)
Chloride: 108 mEq/L (ref 96–112)
Creatinine, Ser: 1.32 mg/dL (ref 0.50–1.35)
GFR calc Af Amer: 81 mL/min — ABNORMAL LOW (ref >90)
GFR calc non Af Amer: 70 mL/min — ABNORMAL LOW (ref >90)
Glucose, Bld: 117 mg/dL — ABNORMAL HIGH (ref 70–99)
Potassium: 3.2 mmol/L — ABNORMAL LOW (ref 3.5–5.1)
Sodium: 147 mmol/L — ABNORMAL HIGH (ref 135–145)
Total Bilirubin: 1.2 mg/dL (ref 0.3–1.2)
Total Protein: 5.9 g/dL — ABNORMAL LOW (ref 6.0–8.3)

## 2014-03-12 LAB — CBC
HCT: 29.6 % — ABNORMAL LOW (ref 39.0–52.0)
Hemoglobin: 9.8 g/dL — ABNORMAL LOW (ref 13.0–17.0)
MCH: 28.7 pg (ref 26.0–34.0)
MCHC: 33.1 g/dL (ref 30.0–36.0)
MCV: 86.5 fL (ref 78.0–100.0)
Platelets: 219 10*3/uL (ref 150–400)
RBC: 3.42 MIL/uL — ABNORMAL LOW (ref 4.22–5.81)
RDW: 15.3 % (ref 11.5–15.5)
WBC: 11.3 10*3/uL — ABNORMAL HIGH (ref 4.0–10.5)

## 2014-03-12 LAB — GLUCOSE, CAPILLARY
Glucose-Capillary: 100 mg/dL — ABNORMAL HIGH (ref 70–99)
Glucose-Capillary: 103 mg/dL — ABNORMAL HIGH (ref 70–99)
Glucose-Capillary: 91 mg/dL (ref 70–99)
Glucose-Capillary: 92 mg/dL (ref 70–99)
Glucose-Capillary: 92 mg/dL (ref 70–99)
Glucose-Capillary: 95 mg/dL (ref 70–99)
Glucose-Capillary: 98 mg/dL (ref 70–99)

## 2014-03-12 MED ORDER — RACEPINEPHRINE HCL 2.25 % IN NEBU
0.5000 mL | INHALATION_SOLUTION | RESPIRATORY_TRACT | Status: DC | PRN
  Administered 2014-03-12 (×2): 0.5 mL via RESPIRATORY_TRACT
  Filled 2014-03-12: qty 0.5

## 2014-03-12 MED ORDER — CETYLPYRIDINIUM CHLORIDE 0.05 % MT LIQD: 7.0000 mL | Freq: Two times a day (BID) | OROMUCOSAL | Status: DC

## 2014-03-12 NOTE — Progress Notes (Signed)
Patient self extubated. Patient placed on Bipap by resp therapist. Dr. Georgette Dover notified. Patient sats 100% on Bipap. Orders to continue Bipap. No distress noted. Will continue to monitor.

## 2014-03-12 NOTE — Progress Notes (Signed)
Wasted 150 mL IV Fentanyl in the sink.  Leverne Humbles, RN witnessed.

## 2014-03-12 NOTE — Progress Notes (Signed)
Tried to wean pt off of BiPAP by using Venturi Mask, pt did not tolerate mask for long and WOB increased, and stridor heard. RT gave Racemic Epi, pt is now back on BiPAP tolerating well with decreased WOB. RT will continue to monitor
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
## 2014-03-12 NOTE — Progress Notes (Signed)
SLP Cancellation Note  Patient Details Name: Shawn Ramsey MRN: [id number] DOB: 1980-06-26   Cancelled treatment:       Reason Eval/Treat Not Completed: Medical issues which prohibited therapy; pt self-extubated this am but remains on BiPap.  Swallow eval pending weaning from BiPap.   [name] 03/12/2014, 11:12 AM
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
## 2014-03-12 NOTE — Progress Notes (Signed)
Called to pts room pt had extubated his self. Pt being bagged by RN. Pt sats 90%. Marked stridor noted. Pt placed on bipap by thia RT. Racemic epie given. bipap settings are 13/6 100% FIO2. Pt sats now 100%.

## 2014-03-13 ENCOUNTER — Encounter (HOSPITAL_COMMUNITY): Payer: Self-pay | Admitting: Orthopedic Surgery

## 2014-03-13 LAB — GLUCOSE, CAPILLARY
Glucose-Capillary: 102 mg/dL — ABNORMAL HIGH (ref 70–99)
Glucose-Capillary: 92 mg/dL (ref 70–99)
Glucose-Capillary: 96 mg/dL (ref 70–99)
Glucose-Capillary: 88 mg/dL (ref 70–99)
Glucose-Capillary: 91 mg/dL (ref 70–99)

## 2014-03-13 MED ORDER — WHITE PETROLATUM GEL
Status: AC
Start: 2014-03-13 — End: 2014-03-13
  Administered 2014-03-13: 18:00:00
  Filled 2014-03-13: qty 1
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
## 2014-03-13 NOTE — Progress Notes (Signed)
UR completed.  Darlis Wragg, RN BSN [facility] CCM Trauma/Neuro ICU Case Manager [phone]  

## 2014-03-13 NOTE — Progress Notes (Signed)
Patient stated that he wanted to stay on Bipap for a little while longer, so he could get some more rest.  Will attempt to take patient off later when patient is more awake.

## 2014-03-13 NOTE — Progress Notes (Signed)
Patient still wishing to wear the Bipap for a little while longer.  RT will continue to monitor.
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
## 2014-03-13 NOTE — Progress Notes (Signed)
SLP Cancellation Note  Patient Details Name: Shawn Ramsey MRN: [id number] DOB: 1980/11/04   Cancelled treatment:       Reason Eval/Treat Not Completed: Patient not medically ready. Discussed with RN, will continue to follow.    Germain Osgood, M.A. CCC-SLP [phone]  Germain Osgood 03/13/2014, 10:34 AM

## 2014-03-14 LAB — GLUCOSE, CAPILLARY
Glucose-Capillary: 101 mg/dL — ABNORMAL HIGH (ref 70–99)
Glucose-Capillary: 102 mg/dL — ABNORMAL HIGH (ref 70–99)
Glucose-Capillary: 105 mg/dL — ABNORMAL HIGH (ref 70–99)
Glucose-Capillary: 115 mg/dL — ABNORMAL HIGH (ref 70–99)
Glucose-Capillary: 95 mg/dL (ref 70–99)
Glucose-Capillary: 97 mg/dL (ref 70–99)

## 2014-03-14 MED ORDER — ACETAMINOPHEN 650 MG RE SUPP: 650.0000 mg | Freq: Four times a day (QID) | RECTAL | Status: DC | PRN

## 2014-03-14 MED ORDER — OXYCODONE-ACETAMINOPHEN 5-325 MG PO TABS
1.0000 | ORAL_TABLET | ORAL | Status: DC | PRN
  Administered 2014-03-14 – 2014-03-17 (×11): 2 via ORAL
  Filled 2014-03-14 (×12): qty 2

## 2014-03-14 NOTE — Progress Notes (Signed)
Pt resting comfortably on nasal cannula, will place pt on BiPAP if/when pt is ready

## 2014-03-14 NOTE — Progress Notes (Addendum)
SLP Cancellation Note  Patient Details Name: Shawn Ramsey MRN: [id number] DOB: [birth year]   Cancelled treatment:       Reason Eval/Treat Not Completed: Patient not medically ready. Pt continues to have high respiratory needs, now on nonrebreather mask. Will continue to follow for swallow evaluation as able.  Addendum: SLP orders canceled this afternoon. Per discussion with RN, orders were discontinued by MD who determined that swallow evaluation was not necessary at this time, and that f/u on diet initiated was not wanted. Please re-order SLP services as needed.   Germain Osgood, M.A. CCC-SLP [phone]  Germain Osgood 03/14/2014, 12:57 PM
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
## 2014-03-14 NOTE — Progress Notes (Signed)
Trauma Service Note  Subjective: Patient still on BiPap.  Subjectively gets SOB when taken off of t he BiPap.  Does not use Bipap continuously at home.  Objective: Vital signs in last 24 hours: Temp:  [98.4 F (36.9 C)-100.4 F (38 C)] 99.1 F (37.3 C) (01/22 0700) Pulse Rate:  [61-99] 82 (01/22 0700) Resp:  [16-24] 16 (01/22 0700) BP: (127-176)/(69-96) 162/96 mmHg (01/22 0700) SpO2:  [92 %-99 %] 97 % (01/22 0728) FiO2 (%):  [40 %-50 %] 40 % (01/22 0729) Weight:  [123.3 kg (271 lb 13.2 oz)] 123.3 kg (271 lb 13.2 oz) (01/22 0600) Last BM Date: 03/06/14  Intake/Output from previous day: 01/21 0701 - 01/22 0700 In: 2400 [I.V.:2400] Out: 2100 [Urine:2100] Intake/Output this shift:    General: No distress  Lungs: Clear with BiPap at 6/6 and FIO2 .40.  Abd: Soft, not distended, hypoactive bowel sounds.  Wants to eat.  Extremities: No changes.  No clinical signs or symptoms of DVT  Neuro: Intact  Lab Results: CBC   Recent Labs  03/11/14 1600 03/12/14 0515  WBC 12.6* 11.3*  HGB 10.0* 9.8*  HCT 29.8* 29.6*  PLT 197 219   BMET  Recent Labs  03/11/14 0848 03/12/14 0515  NA 145 147*  K 3.4* 3.2*  CL  --  108  CO2  --  30  GLUCOSE  --  117*  BUN  --  18  CREATININE  --  1.32  CALCIUM  --  7.8*   PT/INR No results for input(s): LABPROT, INR in the last 72 hours. ABG  Recent Labs  03/11/14 0848  PHART 7.405  HCO3 27.8*    Studies/Results: No results found.  Anti-infectives: Anti-infectives    Start     Dose/Rate Route Frequency Ordered Stop   03/11/14 1400  ceFAZolin (ANCEF) 3 g in dextrose 5 % 50 mL IVPB  Status:  Discontinued     3 g160 mL/hr over 30 Minutes Intravenous 3 times per day 03/11/14 1022 03/11/14 1038   03/11/14 0845  ceFAZolin (ANCEF) 3 g in dextrose 5 % 50 mL IVPB  Status:  Discontinued     3 g160 mL/hr over 30 Minutes Intravenous To Surgery 03/11/14 0841 03/11/14 1022   03/11/14 0600  ceFAZolin (ANCEF) 3 g in dextrose 5 % 50 mL  IVPB     3 g160 mL/hr over 30 Minutes Intravenous On call to O.R. 03/10/14 0925 03/11/14 0850   03/07/14 1400  ceFAZolin (ANCEF) IVPB 2 g/50 mL premix     2 g100 mL/hr over 30 Minutes Intravenous 3 times per day 03/07/14 0959 03/09/14 0542   03/06/14 2200  ceFAZolin (ANCEF) IVPB 2 g/50 mL premix  Status:  Discontinued     2 g100 mL/hr over 30 Minutes Intravenous 3 times per day 03/06/14 1831 03/07/14 0959   03/06/14 1315  ceFAZolin (ANCEF) IVPB 2 g/50 mL premix     2 g100 mL/hr over 30 Minutes Intravenous  Once 03/06/14 1313 03/06/14 1648      Assessment/Plan: s/p Procedure(s): PERCUTANEOUS SCREW FIXATION LEFT MEDIAL TIBIAL PLATEAU   d/c foley Discontnue BiPap and try NRB mask  LOS: 8 days   Kathryne Eriksson. Dahlia Bailiff, MD, FACS [phone] Trauma Surgeon 03/14/2014
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
## 2014-03-15 DIAGNOSIS — D62 Acute posthemorrhagic anemia: Secondary | ICD-10-CM | POA: Diagnosis present

## 2014-03-15 DIAGNOSIS — S82142A Displaced bicondylar fracture of left tibia, initial encounter for closed fracture: Secondary | ICD-10-CM | POA: Diagnosis present

## 2014-03-15 DIAGNOSIS — S75009A Unspecified injury of femoral artery, unspecified leg, initial encounter: Secondary | ICD-10-CM

## 2014-03-15 DIAGNOSIS — G4733 Obstructive sleep apnea (adult) (pediatric): Secondary | ICD-10-CM | POA: Diagnosis present

## 2014-03-15 DIAGNOSIS — E876 Hypokalemia: Secondary | ICD-10-CM | POA: Diagnosis not present

## 2014-03-15 HISTORY — DX: Unspecified injury of femoral artery, unspecified leg, initial encounter: S75.009A

## 2014-03-15 LAB — CBC WITH DIFFERENTIAL/PLATELET
Basophils Absolute: 0 10*3/uL (ref 0.0–0.1)
Basophils Relative: 0 % (ref 0–1)
Eosinophils Absolute: 0.2 10*3/uL (ref 0.0–0.7)
Eosinophils Relative: 2 % (ref 0–5)
HCT: 30.2 % — ABNORMAL LOW (ref 39.0–52.0)
Hemoglobin: 9.7 g/dL — ABNORMAL LOW (ref 13.0–17.0)
Lymphocytes Relative: 21 % (ref 12–46)
Lymphs Abs: 2.5 10*3/uL (ref 0.7–4.0)
MCH: 28 pg (ref 26.0–34.0)
MCHC: 32.1 g/dL (ref 30.0–36.0)
MCV: 87 fL (ref 78.0–100.0)
Monocytes Absolute: 1 10*3/uL (ref 0.1–1.0)
Monocytes Relative: 8 % (ref 3–12)
Neutro Abs: 8.2 10*3/uL — ABNORMAL HIGH (ref 1.7–7.7)
Neutrophils Relative %: 69 % (ref 43–77)
Platelets: 420 10*3/uL — ABNORMAL HIGH (ref 150–400)
RBC: 3.47 MIL/uL — ABNORMAL LOW (ref 4.22–5.81)
RDW: 15.1 % (ref 11.5–15.5)
Smear Review: ADEQUATE
WBC: 11.9 10*3/uL — ABNORMAL HIGH (ref 4.0–10.5)

## 2014-03-15 LAB — GLUCOSE, CAPILLARY
Glucose-Capillary: 72 mg/dL (ref 70–99)
Glucose-Capillary: 89 mg/dL (ref 70–99)
Glucose-Capillary: 98 mg/dL (ref 70–99)

## 2014-03-15 LAB — BASIC METABOLIC PANEL
Anion gap: 11 (ref 5–15)
BUN: 9 mg/dL (ref 6–23)
CO2: 30 mmol/L (ref 19–32)
Calcium: 8.2 mg/dL — ABNORMAL LOW (ref 8.4–10.5)
Chloride: 103 mmol/L (ref 96–112)
Creatinine, Ser: 1.17 mg/dL (ref 0.50–1.35)
GFR calc Af Amer: >90 mL/min (ref >90)
GFR calc non Af Amer: 81 mL/min — ABNORMAL LOW (ref >90)
Glucose, Bld: 98 mg/dL (ref 70–99)
Potassium: 2.6 mmol/L — CL (ref 3.5–5.1)
Sodium: 144 mmol/L (ref 135–145)

## 2014-03-15 MED ORDER — DOCUSATE SODIUM 100 MG PO CAPS
100.0000 mg | ORAL_CAPSULE | Freq: Two times a day (BID) | ORAL | Status: DC
  Administered 2014-03-15 – 2014-03-18 (×7): 100 mg via ORAL
  Filled 2014-03-15 (×8): qty 1

## 2014-03-15 MED ORDER — CETYLPYRIDINIUM CHLORIDE 0.05 % MT LIQD
7.0000 mL | Freq: Two times a day (BID) | OROMUCOSAL | Status: DC
  Administered 2014-03-15 – 2014-03-18 (×6): 7 mL via OROMUCOSAL

## 2014-03-15 MED ORDER — POTASSIUM CHLORIDE CRYS ER 20 MEQ PO TBCR
40.0000 meq | EXTENDED_RELEASE_TABLET | Freq: Two times a day (BID) | ORAL | Status: DC
  Administered 2014-03-16 – 2014-03-18 (×5): 40 meq via ORAL
  Filled 2014-03-15 (×8): qty 2

## 2014-03-15 MED ORDER — POTASSIUM CHLORIDE 10 MEQ/50ML IV SOLN
10.0000 meq | INTRAVENOUS | Status: AC
  Administered 2014-03-15 (×6): 10 meq via INTRAVENOUS
  Filled 2014-03-15: qty 50

## 2014-03-15 MED ORDER — POLYETHYLENE GLYCOL 3350 17 G PO PACK
17.0000 g | PACK | Freq: Every day | ORAL | Status: DC
  Administered 2014-03-15 – 2014-03-17 (×2): 17 g via ORAL
  Filled 2014-03-15 (×4): qty 1

## 2014-03-15 MED ORDER — ENOXAPARIN SODIUM 30 MG/0.3ML ~~LOC~~ SOLN
30.0000 mg | Freq: Two times a day (BID) | SUBCUTANEOUS | Status: DC
  Administered 2014-03-15 – 2014-03-18 (×7): 30 mg via SUBCUTANEOUS
  Filled 2014-03-15 (×9): qty 0.3

## 2014-03-15 MED ORDER — POTASSIUM CHLORIDE CRYS ER 20 MEQ PO TBCR
60.0000 meq | EXTENDED_RELEASE_TABLET | Freq: Once | ORAL | Status: AC
  Administered 2014-03-15: 60 meq via ORAL
  Filled 2014-03-15: qty 3

## 2014-03-15 MED ORDER — IPRATROPIUM-ALBUTEROL 0.5-2.5 (3) MG/3ML IN SOLN
3.0000 mL | RESPIRATORY_TRACT | Status: DC
  Administered 2014-03-15 – 2014-03-16 (×7): 3 mL via RESPIRATORY_TRACT
  Filled 2014-03-15 (×7): qty 3

## 2014-03-15 NOTE — Progress Notes (Signed)
Placed pt. On CPAP of 10cm H2O per pt. Comfort via FFM with 40% FIO2. Pt. Is tolerating CPAP well at this time without any complications.

## 2014-03-15 NOTE — Progress Notes (Signed)
CRITICAL VALUE ALERT  Critical value received:  K+ 2.6  Date of notification:  03/15/14  Time of notification:  0525  Critical value read back:Yes.    Nurse who received alert:  Tawni Carnes, RN  MD notified (1st page):  [place]ells, MD  Time of first page:  0527 , MD on the unit  MD notified (2nd page):  Time of second page:  Responding MD:  [place] Bells, MD  Time MD responded: [phone]

## 2014-03-15 NOTE — Progress Notes (Signed)
Pt. States that he can no longer tolerate CPAP at this time. Pt. Was taken off CPAP & placed on 4L [place]. CPAP is at the bedside on standby if needed.
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
## 2014-03-15 NOTE — Progress Notes (Signed)
[facility] P[facility]  Hypokalemia, asked by bedside RN to replace  KCL 60 mEq IV 60 mEq po  Roselie Awkward, MD [place] [facility] Pager: [phone] Cell: [phone] If no response, call [phone]

## 2014-03-16 LAB — BASIC METABOLIC PANEL
Anion gap: 9 (ref 5–15)
BUN: 9 mg/dL (ref 6–23)
CO2: 33 mmol/L — ABNORMAL HIGH (ref 19–32)
Calcium: 7.9 mg/dL — ABNORMAL LOW (ref 8.4–10.5)
Chloride: 97 mmol/L (ref 96–112)
Creatinine, Ser: 1.09 mg/dL (ref 0.50–1.35)
GFR calc Af Amer: >90 mL/min (ref >90)
GFR calc non Af Amer: 88 mL/min — ABNORMAL LOW (ref >90)
Glucose, Bld: 105 mg/dL — ABNORMAL HIGH (ref 70–99)
Potassium: 2.6 mmol/L — CL (ref 3.5–5.1)
Sodium: 139 mmol/L (ref 135–145)

## 2014-03-16 MED ORDER — IPRATROPIUM-ALBUTEROL 0.5-2.5 (3) MG/3ML IN SOLN
3.0000 mL | Freq: Three times a day (TID) | RESPIRATORY_TRACT | Status: DC
  Administered 2014-03-16 – 2014-03-17 (×2): 3 mL via RESPIRATORY_TRACT
  Filled 2014-03-16 (×2): qty 3

## 2014-03-16 MED ORDER — POTASSIUM CHLORIDE 10 MEQ/100ML IV SOLN
10.0000 meq | INTRAVENOUS | Status: AC
  Administered 2014-03-16 (×4): 10 meq via INTRAVENOUS
  Filled 2014-03-16 (×4): qty 100
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
## 2014-03-16 NOTE — Progress Notes (Signed)
CRITICAL VALUE ALERT  Critical value received:  Potassium of 2.6  Date of notification:  03/16/14  Time of notification:  0625  Critical value read back: Yes  Nurse who received alert:  Beacher May  MD notified (1st page): [place]  Time of first page:  [phone]  Responding MD:  Iris Pert  Time MD responded:  [phone]
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
## 2014-03-16 NOTE — Progress Notes (Signed)
Patient refused CPAP tonight. There Isn't a machine in the room at this time. RN aware. Explained to Patient that if they changed their mind, to just have the RN call Respiratory and we would come set them up. Can't get comfortable with the pressures of our machine, maybe be able to bring in home machine.

## 2014-03-17 ENCOUNTER — Telehealth: Payer: Self-pay | Admitting: Family Medicine

## 2014-03-17 ENCOUNTER — Inpatient Hospital Stay (HOSPITAL_COMMUNITY): Payer: Medicaid Other

## 2014-03-17 LAB — BASIC METABOLIC PANEL
Anion gap: 7 (ref 5–15)
BUN: 12 mg/dL (ref 6–23)
CO2: 31 mmol/L (ref 19–32)
Calcium: 8.2 mg/dL — ABNORMAL LOW (ref 8.4–10.5)
Chloride: 102 mmol/L (ref 96–112)
Creatinine, Ser: 0.95 mg/dL (ref 0.50–1.35)
GFR calc Af Amer: >90 mL/min (ref >90)
GFR calc non Af Amer: >90 mL/min (ref >90)
Glucose, Bld: 107 mg/dL — ABNORMAL HIGH (ref 70–99)
Potassium: 3.3 mmol/L — ABNORMAL LOW (ref 3.5–5.1)
Sodium: 140 mmol/L (ref 135–145)

## 2014-03-17 MED ORDER — IPRATROPIUM-ALBUTEROL 0.5-2.5 (3) MG/3ML IN SOLN: 3.0000 mL | Freq: Four times a day (QID) | RESPIRATORY_TRACT | Status: DC | PRN
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
## 2014-03-17 NOTE — Progress Notes (Addendum)
Pt refused cpap tonight stating he can't get comfortable with pressures on hospital-provided machine.  Pt was advised that RT is available all night should he change his mind.

## 2014-03-18 LAB — BASIC METABOLIC PANEL
Anion gap: 9 (ref 5–15)
BUN: 10 mg/dL (ref 6–23)
CO2: 28 mmol/L (ref 19–32)
Calcium: 8.5 mg/dL (ref 8.4–10.5)
Chloride: 103 mmol/L (ref 96–112)
Creatinine, Ser: 0.93 mg/dL (ref 0.50–1.35)
GFR calc Af Amer: >90 mL/min (ref >90)
GFR calc non Af Amer: >90 mL/min (ref >90)
Glucose, Bld: 114 mg/dL — ABNORMAL HIGH (ref 70–99)
Potassium: 3.8 mmol/L (ref 3.5–5.1)
Sodium: 140 mmol/L (ref 135–145)

## 2014-03-18 LAB — BRAIN NATRIURETIC PEPTIDE: B Natriuretic Peptide: 21.9 pg/mL (ref 0.0–100.0)

## 2014-03-18 MED ORDER — ENOXAPARIN SODIUM 30 MG/0.3ML ~~LOC~~ SOLN: 30.0000 mg | Freq: Two times a day (BID) | SUBCUTANEOUS | Status: DC

## 2014-03-18 MED ORDER — SODIUM CHLORIDE 0.9 % IJ SOLN
10.0000 mL | INTRAMUSCULAR | Status: DC | PRN
  Administered 2014-03-18 (×2): 10 mL
  Filled 2014-03-18 (×2): qty 40

## 2014-03-18 MED ORDER — OXYCODONE-ACETAMINOPHEN 5-325 MG PO TABS: 1.0000 | ORAL_TABLET | ORAL | Status: DC | PRN
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
## 2014-03-18 NOTE — Progress Notes (Addendum)
Pt declines BSC for home and wishes to have a set of crutches rather than a RW. For HHPT, will use  [facility] for agency per pt choice.  Address listed in EPIC is not correct.  The address where pt will be at discharge is: [address] [place] [address].  The best phone number to call is [phone].  Sandi Mariscal, RN BSN M[facility] CCM  Case Manager, Trauma Service/Unit 67M [phone]  **ADDENDUM:  Received call from [facility] that pt did not have a qualifying dx for HHPT that Medicaid would cover. Changed the order with PA's permission to [facility] to do home safety eval and post-acute follow up.

## 2014-03-18 NOTE — Progress Notes (Signed)
SATURATION QUALIFICATIONS: (This note is used to comply with regulatory documentation for home oxygen)  Patient Saturations on Room Air at Rest = 97%  Patient Saturations on Room Air while Ambulating = 94%  Patient Saturations on 0 Liters of oxygen while Ambulating = na%  Please briefly explain why patient needs home oxygen: 

## 2014-03-18 NOTE — Discharge Instructions (Signed)
Orthopaedic Trauma Service Discharge Instructions,   General Discharge Instructions  WEIGHT BEARING STATUS: Nonweightbearing Left Leg  RANGE OF MOTION/ACTIVITY: Range of motion as tolerated Left knee   Diet: as you were eating previously.  Can use over the counter stool softeners and bowel preparations, such as Miralax, to help with bowel movements.  Narcotics can be constipating.  Be sure to drink plenty of fluids  STOP SMOKING OR USING NICOTINE PRODUCTS!!!!  As discussed nicotine severely impairs your body's ability to heal surgical and traumatic wounds but also impairs bone healing.  Wounds and bone heal by forming microscopic blood vessels (angiogenesis) and nicotine is a vasoconstrictor (essentially, shrinks blood vessels).  Therefore, if vasoconstriction occurs to these microscopic blood vessels they essentially disappear and are unable to deliver necessary nutrients to the healing tissue.  This is one modifiable factor that you can do to dramatically increase your chances of healing your injury.    (This means no smoking, no nicotine gum, patches, etc)  DO NOT USE NONSTEROIDAL ANTI-INFLAMMATORY DRUGS (NSAID'S)  Using products such as Advil (ibuprofen), Aleve (naproxen), Motrin (ibuprofen) for additional pain control during fracture healing can delay and/or prevent the healing response.  If you would like to take over the counter (OTC) medication, Tylenol (acetaminophen) is ok.  However, some narcotic medications that are given for pain control contain acetaminophen as well. Therefore, you should not exceed more than 4000 mg of tylenol in a day if you do not have liver disease.  Also note that there are may OTC medicines, such as cold medicines and allergy medicines that my contain tylenol as well.  If you have any questions about medications and/or interactions please ask your doctor/PA or your pharmacist.   PAIN MEDICATION USE AND EXPECTATIONS  You have likely been given narcotic  medications to help control your pain.  After a traumatic event that results in an fracture (broken bone) with or without surgery, it is ok to use narcotic pain medications to help control one's pain.  We understand that everyone responds to pain differently and each individual patient will be evaluated on a regular basis for the continued need for narcotic medications. Ideally, narcotic medication use should last no more than 6-8 weeks (coinciding with fracture healing).   As a patient it is your responsibility as well to monitor narcotic medication use and report the amount and frequency you use these medications when you come to your office visit.   We would also advise that if you are using narcotic medications, you should take a dose prior to therapy to maximize you participation.  IF YOU ARE ON NARCOTIC MEDICATIONS IT IS NOT PERMISSIBLE TO OPERATE A MOTOR VEHICLE (MOTORCYCLE/CAR/TRUCK/MOPED) OR HEAVY MACHINERY DO NOT MIX NARCOTICS WITH OTHER CNS (CENTRAL NERVOUS SYSTEM) DEPRESSANTS SUCH AS ALCOHOL       ICE AND ELEVATE INJURED/OPERATIVE EXTREMITY  Using ice and elevating the injured extremity above your heart can help with swelling and pain control.  Icing in a pulsatile fashion, such as 20 minutes on and 20 minutes off, can be followed.    Do not place ice directly on skin. Make sure there is a barrier between to skin and the ice pack.    Using frozen items such as frozen peas works well as the conform nicely to the are that needs to be iced.  USE AN ACE WRAP OR TED HOSE FOR SWELLING CONTROL  In addition to icing and elevation, Ace wraps or TED hose are used to help limit  and resolve swelling.  It is recommended to use Ace wraps or TED hose until you are informed to stop.    When using Ace Wraps start the wrapping distally (farthest away from the body) and wrap proximally (closer to the body)   Example: If you had surgery on your leg or thing and you do not have a splint on, start the ace  wrap at the toes and work your way up to the thigh        If you had surgery on your upper extremity and do not have a splint on, start the ace wrap at your fingers and work your way up to the upper arm  IF YOU ARE IN A SPLINT OR CAST DO NOT [place]   If your splint gets wet for any reason please contact the office immediately. You may shower in your splint or cast as long as you keep it dry.  This can be done by wrapping in a cast cover or garbage back (or similar)  Do Not stick any thing down your splint or cast such as pencils, money, or hangers to try and scratch yourself with.  If you feel itchy take benadryl as prescribed on the bottle for itching  IF YOU ARE IN A CAM BOOT (BLACK BOOT)  You may remove boot periodically. Perform daily dressing changes as noted below.  Wash the liner of the boot regularly and wear a sock when wearing the boot. It is recommended that you sleep in the boot until told otherwise  CALL THE OFFICE WITH ANY QUESTIONS OR CONCERTS: [phone]     Discharge Pin Site Instructions  Dress pins daily with Kerlix roll starting on POD 2. Wrap the Kerlix so that it tamps the skin down around the pin-skin interface to prevent/limit motion of the skin relative to the pin.  (Pin-skin motion is the primary cause of pain and infection related to external fixator pin sites).  Remove any crust or coagulum that may obstruct drainage with a saline moistened gauze or soap and water.  After POD 3, if there is no discernable drainage on the pin site dressing, the interval for change can by increased to every other day.  You may shower with the fixator, cleaning all pin sites gently with soap and water.  If you have a surgical wound this needs to be completely dry and without drainage before showering.  The extremity can be lifted by the fixator to facilitate wound care and transfers.  Notify the office/Doctor if you experience increasing drainage, redness, or  pain from a pin site, or if you notice purulent (thick, snot-like) drainage.  Discharge Wound Care Instructions  Do NOT apply any ointments, solutions or lotions to pin sites or surgical wounds.  These prevent needed drainage and even though solutions like hydrogen peroxide kill bacteria, they also damage cells lining the pin sites that help fight infection.  Applying lotions or ointments can keep the wounds moist and can cause them to breakdown and open up as well. This can increase the risk for infection. When in doubt call the office.  Surgical incisions should be dressed daily.  If any drainage is noted, use one layer of adaptic, then gauze, Kerlix, and an ace wrap.  Once the incision is completely dry and without drainage, it may be left open to air out.  Showering may begin 36-48 hours later.  Cleaning gently with soap and water.  Traumatic wounds should be dressed daily  as well.    One layer of adaptic, gauze, Kerlix, then ace wrap.  The adaptic can be discontinued once the draining has ceased    If you have a wet to dry dressing: wet the gauze with saline the squeeze as much saline out so the gauze is moist (not soaking wet), place moistened gauze over wound, then place a dry gauze over the moist one, followed by Kerlix wrap, then ace wrap.
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
## 2014-03-18 NOTE — Progress Notes (Signed)
Discharge home. Home discharge instruction given. Lovenox administration given and showed to mother and patient, verbalizes understanding about it. Crutches provided as order. Case management arranged [place] needs.
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
## 2014-03-18 NOTE — Progress Notes (Signed)
Orthopedic Tech Progress Note Patient Details:  Shawn Ramsey 20-Nov-1980 [id number]  Ortho Devices Type of Ortho Device: Crutches Ortho Device/Splint Location: LLE Ortho Device/Splint Interventions: Ordered   [place] 03/18/2014, 3:25 PM

## 2014-03-27 ENCOUNTER — Telehealth (HOSPITAL_COMMUNITY): Payer: Self-pay

## 2014-03-27 ENCOUNTER — Encounter: Payer: Self-pay | Admitting: Internal Medicine

## 2014-03-27 ENCOUNTER — Institutional Professional Consult (permissible substitution): Payer: Medicaid Other | Admitting: Internal Medicine

## 2014-03-27 ENCOUNTER — Ambulatory Visit (INDEPENDENT_AMBULATORY_CARE_PROVIDER_SITE_OTHER): Payer: Medicaid Other | Admitting: Internal Medicine

## 2014-03-27 VITALS — BP 120/72 | HR 95 | Temp 98.8°F | Ht 65.0 in | Wt 259.8 lb

## 2014-03-27 DIAGNOSIS — G4733 Obstructive sleep apnea (adult) (pediatric): Secondary | ICD-10-CM

## 2014-03-27 DIAGNOSIS — J9600 Acute respiratory failure, unspecified whether with hypoxia or hypercapnia: Secondary | ICD-10-CM

## 2014-03-27 MED ORDER — OXYCODONE-ACETAMINOPHEN 5-325 MG PO TABS: 1.0000 | ORAL_TABLET | ORAL | Status: DC | PRN

## 2014-03-27 NOTE — Progress Notes (Signed)
Try to wean you pain pills if possible   Pulmomonary follow up is as needed
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
## 2014-03-27 NOTE — Patient Instructions (Signed)
Try to wean down your pain medication if possible  Pulmonary follow up is as needed

## 2014-03-27 NOTE — Telephone Encounter (Signed)
Mom wanted to know about pt's headaches. I explained to her the likely cause of concussion and that they should resolve with time. I also recommended he follow up with his PCP if they do not seem to be improving.

## 2014-03-27 NOTE — Assessment & Plan Note (Signed)
03/27/2014   Walked RA x one lap @ 185 stopped due to fatigue, not sob, sats 88% at end    Much better vs admit status, likely still has some residual atx in bases but just needs more mobilization at this point and no pulmoanry f/u unless dr Lemmie Evens requests

## 2014-03-27 NOTE — Assessment & Plan Note (Signed)
Not able to use equipment but having no daytime hypersomnolence despite use of narcotics for pain control  Advised to use the lowest dose of opioids that controls but no eliminates the pain and f/u with Dr [name] and if still concern re osa refer to one of our sleep specialists

## 2014-04-01 ENCOUNTER — Institutional Professional Consult (permissible substitution): Payer: Medicaid Other | Admitting: Internal Medicine

## 2014-04-02 ENCOUNTER — Ambulatory Visit: Payer: Medicaid Other | Admitting: Family Medicine

## 2014-04-03 ENCOUNTER — Ambulatory Visit (INDEPENDENT_AMBULATORY_CARE_PROVIDER_SITE_OTHER): Payer: Medicaid Other | Admitting: Family Medicine

## 2014-04-03 ENCOUNTER — Encounter: Payer: Self-pay | Admitting: Family Medicine

## 2014-04-03 VITALS — BP 120/74 | HR 101 | Resp 16 | Ht 65.0 in | Wt 262.0 lb

## 2014-04-03 DIAGNOSIS — G4730 Sleep apnea, unspecified: Secondary | ICD-10-CM

## 2014-04-03 DIAGNOSIS — R7309 Other abnormal glucose: Secondary | ICD-10-CM

## 2014-04-03 DIAGNOSIS — R7303 Prediabetes: Secondary | ICD-10-CM

## 2014-04-03 DIAGNOSIS — Z13220 Encounter for screening for lipoid disorders: Secondary | ICD-10-CM

## 2014-04-03 DIAGNOSIS — G44309 Post-traumatic headache, unspecified, not intractable: Secondary | ICD-10-CM

## 2014-04-03 DIAGNOSIS — F5105 Insomnia due to other mental disorder: Secondary | ICD-10-CM

## 2014-04-03 DIAGNOSIS — Z09 Encounter for follow-up examination after completed treatment for conditions other than malignant neoplasm: Secondary | ICD-10-CM

## 2014-04-03 DIAGNOSIS — F409 Phobic anxiety disorder, unspecified: Secondary | ICD-10-CM

## 2014-04-03 DIAGNOSIS — F29 Unspecified psychosis not due to a substance or known physiological condition: Secondary | ICD-10-CM

## 2014-04-03 MED ORDER — TEMAZEPAM 30 MG PO CAPS: 30.0000 mg | ORAL_CAPSULE | Freq: Every evening | ORAL | Status: DC | PRN

## 2014-04-03 NOTE — Patient Instructions (Signed)
F/U in 4 month, call if you need me before   Thankful that you are much better  New med restoril for sleep at night  You are referred to Dr Merlene Laughter for post concussion  Headache  Fasting lipid, chem 7, hBa1C and TSH in 4 month
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
## 2014-04-06 DIAGNOSIS — Z09 Encounter for follow-up examination after completed treatment for conditions other than malignant neoplasm: Secondary | ICD-10-CM | POA: Insufficient documentation

## 2014-04-06 NOTE — Assessment & Plan Note (Signed)
Unchanged. Patient re-educated about  the importance of commitment to a  minimum of 150 minutes of exercise per week. The importance of healthy food choices with portion control discussed. Encouraged to start a food diary, count calories and to consider  joining a support group. Sample diet sheets offered. Goals set by the patient for the next several months.    
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
## 2014-04-06 NOTE — Assessment & Plan Note (Signed)
Sleep hygiene reviewed and written information offered also. Prescription sent for  medication needed.  

## 2014-04-06 NOTE — Assessment & Plan Note (Signed)
Pt reminded of the need to comply with use of CPAP regularly
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
## 2014-04-06 NOTE — Assessment & Plan Note (Signed)
Stable , treated by mental health, no new medication per [facility]

## 2014-04-28 ENCOUNTER — Telehealth: Payer: Self-pay

## 2014-04-28 NOTE — Telephone Encounter (Signed)
Called and spoke with mother and she states that patient needs statement saying the he is in need of a 2 bedroom apartment due to medical reasons.  She will call office back to give more details.

## 2014-04-28 NOTE — Telephone Encounter (Signed)
Patient will be receiving PCS services and may at times need someone to stay overnight with him.  Please advise if you agree with giving statement.

## 2014-04-30 NOTE — Telephone Encounter (Signed)
I agree with letter , not sure who to send msg to but his chart needs to be merged, let me know if have any thoughts/ able to send the message pls

## 2014-05-01 NOTE — Telephone Encounter (Signed)
Letter composed and mother will collect

## 2014-05-29 ENCOUNTER — Ambulatory Visit (INDEPENDENT_AMBULATORY_CARE_PROVIDER_SITE_OTHER): Payer: Medicaid Other | Admitting: Family Medicine

## 2014-05-29 ENCOUNTER — Encounter: Payer: Self-pay | Admitting: Family Medicine

## 2014-05-29 VITALS — BP 120/84 | HR 86 | Resp 16 | Ht 65.0 in | Wt 272.4 lb

## 2014-05-29 DIAGNOSIS — F17200 Nicotine dependence, unspecified, uncomplicated: Secondary | ICD-10-CM

## 2014-05-29 DIAGNOSIS — F29 Unspecified psychosis not due to a substance or known physiological condition: Secondary | ICD-10-CM

## 2014-05-29 DIAGNOSIS — G4730 Sleep apnea, unspecified: Secondary | ICD-10-CM | POA: Diagnosis not present

## 2014-05-29 DIAGNOSIS — J45991 Cough variant asthma: Secondary | ICD-10-CM | POA: Diagnosis not present

## 2014-05-29 DIAGNOSIS — E8881 Metabolic syndrome: Secondary | ICD-10-CM | POA: Diagnosis not present

## 2014-05-29 DIAGNOSIS — Z720 Tobacco use: Secondary | ICD-10-CM

## 2014-05-29 NOTE — Patient Instructions (Signed)
F/u in 4 month, call if you need me before  Please get labs today  F/u with neurology re CPAP machine , and Dr Marcelino Scot as planned  Weight loss goal  Of 8 pounds

## 2014-05-30 LAB — LIPID PANEL
Cholesterol: 198 mg/dL (ref 0–200)
HDL: 26 mg/dL — ABNORMAL LOW (ref >=40)
LDL Cholesterol: 143 mg/dL — ABNORMAL HIGH (ref 0–99)
Total CHOL/HDL Ratio: 7.6 Ratio
Triglycerides: 143 mg/dL (ref <150)
VLDL: 29 mg/dL (ref 0–40)

## 2014-05-30 LAB — HEMOGLOBIN A1C
Hgb A1c MFr Bld: 6.1 % — ABNORMAL HIGH (ref <5.7)
Mean Plasma Glucose: 128 mg/dL — ABNORMAL HIGH (ref <117)

## 2014-05-30 LAB — BASIC METABOLIC PANEL
BUN: 11 mg/dL (ref 6–23)
CO2: 26 mEq/L (ref 19–32)
Calcium: 9.1 mg/dL (ref 8.4–10.5)
Chloride: 104 mEq/L (ref 96–112)
Creat: 0.66 mg/dL (ref 0.50–1.35)
Glucose, Bld: 83 mg/dL (ref 70–99)
Potassium: 4.2 mEq/L (ref 3.5–5.3)
Sodium: 140 mEq/L (ref 135–145)

## 2014-05-30 LAB — TSH: TSH: 2.494 u[IU]/mL (ref 0.350–4.500)

## 2014-05-30 MED ORDER — METFORMIN HCL ER 500 MG PO TB24: 500.0000 mg | ORAL_TABLET | Freq: Every day | ORAL | Status: DC

## 2014-05-30 NOTE — Addendum Note (Signed)
Addended by: Denman George B on: 05/30/2014 02:23 PM   Modules accepted: Orders

## 2014-05-30 NOTE — Addendum Note (Signed)
Addended by: Denman George B on: 05/30/2014 02:26 PM   Modules accepted: Orders

## 2014-06-26 ENCOUNTER — Encounter: Payer: Self-pay | Admitting: Family Medicine

## 2014-06-26 ENCOUNTER — Ambulatory Visit (INDEPENDENT_AMBULATORY_CARE_PROVIDER_SITE_OTHER): Payer: Medicaid Other | Admitting: Family Medicine

## 2014-06-26 VITALS — BP 132/84 | HR 82 | Resp 18 | Ht 65.0 in | Wt 270.0 lb

## 2014-06-26 DIAGNOSIS — F17208 Nicotine dependence, unspecified, with other nicotine-induced disorders: Secondary | ICD-10-CM

## 2014-06-26 DIAGNOSIS — J209 Acute bronchitis, unspecified: Secondary | ICD-10-CM

## 2014-06-26 DIAGNOSIS — Z720 Tobacco use: Secondary | ICD-10-CM

## 2014-06-26 DIAGNOSIS — R0789 Other chest pain: Secondary | ICD-10-CM

## 2014-06-26 DIAGNOSIS — R0600 Dyspnea, unspecified: Secondary | ICD-10-CM

## 2014-06-26 DIAGNOSIS — R7302 Impaired glucose tolerance (oral): Secondary | ICD-10-CM

## 2014-06-26 DIAGNOSIS — F17200 Nicotine dependence, unspecified, uncomplicated: Secondary | ICD-10-CM

## 2014-06-26 DIAGNOSIS — J45991 Cough variant asthma: Secondary | ICD-10-CM

## 2014-06-26 DIAGNOSIS — R0689 Other abnormalities of breathing: Secondary | ICD-10-CM

## 2014-06-26 DIAGNOSIS — J40 Bronchitis, not specified as acute or chronic: Secondary | ICD-10-CM

## 2014-06-26 DIAGNOSIS — J0110 Acute frontal sinusitis, unspecified: Secondary | ICD-10-CM

## 2014-06-26 DIAGNOSIS — E8881 Metabolic syndrome: Secondary | ICD-10-CM

## 2014-06-26 DIAGNOSIS — J309 Allergic rhinitis, unspecified: Secondary | ICD-10-CM | POA: Diagnosis not present

## 2014-06-26 HISTORY — DX: Bronchitis, not specified as acute or chronic: J40

## 2014-06-26 MED ORDER — MOMETASONE FUROATE 50 MCG/ACT NA SUSP: 2.0000 | Freq: Every day | NASAL | Status: DC

## 2014-06-26 MED ORDER — BENZONATATE 100 MG PO CAPS: 100.0000 mg | ORAL_CAPSULE | Freq: Two times a day (BID) | ORAL | Status: DC | PRN

## 2014-06-26 MED ORDER — LEVOFLOXACIN 500 MG PO TABS: 500.0000 mg | ORAL_TABLET | Freq: Every day | ORAL | Status: DC

## 2014-06-26 MED ORDER — MONTELUKAST SODIUM 10 MG PO TABS: 10.0000 mg | ORAL_TABLET | Freq: Every day | ORAL | Status: DC

## 2014-06-26 MED ORDER — PENICILLIN V POTASSIUM 500 MG PO TABS: 500.0000 mg | ORAL_TABLET | Freq: Three times a day (TID) | ORAL | Status: DC
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
## 2014-06-26 NOTE — Patient Instructions (Addendum)
F/u in August as before, please get labs 5 days before appt.  You are  Treated today for acute bronchitis and sinusitis and also for  Allergies , medications are sent in  Please get CXR today  You are referred to heart specialist, your EKG today does not suggest that you have heart damage.  You are referred to Dr Luan Pulling for evaluation of your breathing problems  Pls call orthopedic Doctor about pain in leg  Please STOP smopking entirely, this will help your health  Thanks for choosing [facility], we consider it a privelige to serve you.

## 2014-06-27 ENCOUNTER — Other Ambulatory Visit: Payer: Self-pay

## 2014-06-27 MED ORDER — FLUTICASONE PROPIONATE 50 MCG/ACT NA SUSP: 2.0000 | Freq: Every day | NASAL | Status: DC

## 2014-07-05 ENCOUNTER — Encounter: Payer: Self-pay | Admitting: Family Medicine

## 2014-07-05 DIAGNOSIS — J019 Acute sinusitis, unspecified: Secondary | ICD-10-CM | POA: Insufficient documentation

## 2014-07-05 NOTE — Assessment & Plan Note (Addendum)
New onset left chest pain, nop specific aggravating or relieving factors noted,   EKG in office has non specific abnormalities, possible st elevation in anterior leads he does have increased risk, based on nicotine use and metabolic syndrome, cardiology referral

## 2014-07-05 NOTE — Assessment & Plan Note (Signed)
Unchnaged. Patient re-educated about  the importance of commitment to a  minimum of 150 minutes of exercise per week.  The importance of healthy food choices with portion control discussed. Encouraged to start a food diary, count calories and to consider  joining a support group. Sample diet sheets offered. Goals set by the patient for the next several months.   Weight /BMI 06/26/2014 05/29/2014 04/03/2014  WEIGHT 270 lb 272 lb 6.4 oz 262 lb  HEIGHT 5\' 5"  5\' 5"  5\' 5"   BMI 44.93 kg/m2 45.33 kg/m2 43.6 kg/m2    Current exercise per week 60 minutes.

## 2014-07-05 NOTE — Assessment & Plan Note (Signed)

## 2014-07-05 NOTE — Assessment & Plan Note (Signed)
Reports excessive wheeze and cough , will refer for pulmonary eval

## 2014-07-05 NOTE — Assessment & Plan Note (Signed)
Antibiotic and decongestant prescribed 

## 2014-07-05 NOTE — Assessment & Plan Note (Signed)
Antibiotic course prescribed, and nasal flushes with saline twice daily encouraged

## 2014-07-05 NOTE — Assessment & Plan Note (Signed)
Uncontrolled, medcation adjustment made, and emphasized the importance of taking medications every day

## 2014-07-05 NOTE — Assessment & Plan Note (Signed)
The increased risk of cardiovascular disease associated with this diagnosis, and the need to consistently work on lifestyle to change this is discussed. Following  a  heart healthy diet ,commitment to 30 minutes of exercise at least 5 days per week, as well as control of blood sugar and cholesterol , and achieving a healthy weight are all the areas to be addressed .  

## 2014-07-10 ENCOUNTER — Ambulatory Visit: Payer: Medicaid Other | Admitting: Cardiology

## 2014-07-18 ENCOUNTER — Encounter: Payer: Self-pay | Admitting: Family Medicine

## 2014-07-28 ENCOUNTER — Ambulatory Visit: Payer: Medicaid Other | Admitting: Cardiology

## 2014-08-03 ENCOUNTER — Encounter: Payer: Self-pay | Admitting: Family Medicine
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
## 2014-08-03 NOTE — Assessment & Plan Note (Signed)
The increased risk of cardiovascular disease associated with this diagnosis, and the need to consistently work on lifestyle to change this is discussed. Following  a  heart healthy diet ,commitment to 30 minutes of exercise at least 5 days per week, as well as control of blood sugar and cholesterol , and achieving a healthy weight are all the areas to be addressed .  

## 2014-08-03 NOTE — Assessment & Plan Note (Signed)
Deteriorated. Patient re-educated about  the importance of commitment to a  minimum of 150 minutes of exercise per week.  The importance of healthy food choices with portion control discussed. Encouraged to start a food diary, count calories and to consider  joining a support group. Sample diet sheets offered. Goals set by the patient for the next several months.   Weight /BMI 06/26/2014 05/29/2014 04/03/2014  WEIGHT 270 lb 272 lb 6.4 oz 262 lb  HEIGHT 5\' 5"  5\' 5"  5\' 5"   BMI 44.93 kg/m2 45.33 kg/m2 43.6 kg/m2    Current exercise per week 45 minutes.

## 2014-08-03 NOTE — Assessment & Plan Note (Signed)
Controlled, no change in medication No recent flares of wheezing or dyspnea

## 2014-08-03 NOTE — Assessment & Plan Note (Signed)
Stable and treated by psychiatry 
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
## 2014-08-03 NOTE — Assessment & Plan Note (Signed)
Untreated, needs to contact and follow through with neurology so that he can obtain and use his CPAP machine

## 2014-08-07 ENCOUNTER — Encounter (HOSPITAL_COMMUNITY): Payer: Self-pay | Admitting: Emergency Medicine

## 2014-08-07 ENCOUNTER — Emergency Department (HOSPITAL_COMMUNITY)
Admission: EM | Admit: 2014-08-07 | Discharge: 2014-08-07 | Disposition: A | Discharge status: Home / Self Care | Payer: Medicaid Other | Attending: Emergency Medicine | Admitting: Emergency Medicine

## 2014-08-07 DIAGNOSIS — M79662 Pain in left lower leg: Secondary | ICD-10-CM | POA: Insufficient documentation

## 2014-08-07 DIAGNOSIS — Z87891 Personal history of nicotine dependence: Secondary | ICD-10-CM | POA: Insufficient documentation

## 2014-08-07 DIAGNOSIS — Z87828 Personal history of other (healed) physical injury and trauma: Secondary | ICD-10-CM | POA: Diagnosis not present

## 2014-08-07 DIAGNOSIS — R2242 Localized swelling, mass and lump, left lower limb: Secondary | ICD-10-CM | POA: Diagnosis present

## 2014-08-07 DIAGNOSIS — J45909 Unspecified asthma, uncomplicated: Secondary | ICD-10-CM | POA: Diagnosis not present

## 2014-08-07 HISTORY — DX: Unspecified firearm discharge, undetermined intent, initial encounter: Y24.9XXA

## 2014-08-07 HISTORY — DX: Accidental discharge from unspecified firearms or gun, initial encounter: W34.00XA

## 2014-08-07 MED ORDER — HYDROCODONE-ACETAMINOPHEN 5-325 MG PO TABS: 2.0000 | ORAL_TABLET | ORAL | Status: DC | PRN

## 2014-08-07 MED ORDER — ENOXAPARIN SODIUM 100 MG/ML ~~LOC~~ SOLN
120.0000 mg | Freq: Once | SUBCUTANEOUS | Status: AC
  Administered 2014-08-07: 120 mg via SUBCUTANEOUS
  Filled 2014-08-07: qty 2

## 2014-08-07 MED ORDER — HYDROCODONE-ACETAMINOPHEN 5-325 MG PO TABS
1.0000 | ORAL_TABLET | Freq: Once | ORAL | Status: AC
  Administered 2014-08-07: 1 via ORAL
  Filled 2014-08-07: qty 1

## 2014-08-07 NOTE — ED Notes (Signed)
Pt. Reports left foot swelling starting yesterday.

## 2014-08-07 NOTE — Discharge Instructions (Signed)
Musculoskeletal Pain Musculoskeletal pain is muscle and boney aches and pains. These pains can occur in any part of the body. Your caregiver may treat you without knowing the cause of the pain. They may treat you if blood or urine tests, X-rays, and other tests were normal.  CAUSES There is often not a definite cause or reason for these pains. These pains may be caused by a type of germ (virus). The discomfort may also come from overuse. Overuse includes working out too hard when your body is not fit. Boney aches also come from weather changes. Bone is sensitive to atmospheric pressure changes. HOME CARE INSTRUCTIONS   Ask when your test results will be ready. Make sure you get your test results.  Only take over-the-counter or prescription medicines for pain, discomfort, or fever as directed by your caregiver. If you were given medications for your condition, do not drive, operate machinery or power tools, or sign legal documents for 24 hours. Do not drink alcohol. Do not take sleeping pills or other medications that may interfere with treatment.  Continue all activities unless the activities cause more pain. When the pain lessens, slowly resume normal activities. Gradually increase the intensity and duration of the activities or exercise.  During periods of severe pain, bed rest may be helpful. Lay or sit in any position that is comfortable.  Putting ice on the injured area.  Put ice in a bag.  Place a towel between your skin and the bag.  Leave the ice on for 15 to 20 minutes, 3 to 4 times a day.  Follow up with your caregiver for continued problems and no reason can be found for the pain. If the pain becomes worse or does not go away, it may be necessary to repeat tests or do additional testing. Your caregiver may need to look further for a possible cause. SEEK IMMEDIATE MEDICAL CARE IF:  You have pain that is getting worse and is not relieved by medications.  You develop chest pain  that is associated with shortness or breath, sweating, feeling sick to your stomach (nauseous), or throw up (vomit).  Your pain becomes localized to the abdomen.  You develop any new symptoms that seem different or that concern you. MAKE SURE YOU:   Understand these instructions.  Will watch your condition.  Will get help right away if you are not doing well or get worse. Document Released: 02/07/2005 Document Revised: 05/02/2011 Document Reviewed: 10/12/2012 [facility] Patient Information 2015 [place], [place]. This information is not intended to replace advice given to you by your health care provider. Make sure you discuss any questions you have with your health care provider.  Return tomorrow as scheduled for your ultrasound.  If this test is negative, call your primary doctor for a recheck of your symptoms.  If you do have a blood clot, you will be advised further before you leave here tomorrow.

## 2014-08-08 ENCOUNTER — Ambulatory Visit (HOSPITAL_COMMUNITY)
Admit: 2014-08-08 | Discharge: 2014-08-08 | Disposition: A | Discharge status: Home / Self Care | Payer: Medicaid Other | Source: Ambulatory Visit | Attending: Family Medicine | Admitting: Family Medicine

## 2014-08-08 ENCOUNTER — Other Ambulatory Visit (HOSPITAL_COMMUNITY): Payer: Self-pay | Admitting: Emergency Medicine

## 2014-08-08 DIAGNOSIS — M7989 Other specified soft tissue disorders: Secondary | ICD-10-CM

## 2014-08-08 DIAGNOSIS — M79605 Pain in left leg: Secondary | ICD-10-CM | POA: Insufficient documentation
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
## 2014-08-08 NOTE — ED Provider Notes (Signed)
  Physical Exam  BP 148/93 mmHg  Pulse 108  Temp(Src) 98.6 F (37 C) (Oral)  Resp 20  Ht 5\' 5"  (1.651 m)  Wt 274 lb (124.286 kg)  BMI 45.60 kg/m2  SpO2 98%  Physical Exam  ED Course  Procedures  MDM Patient  informed of negative Doppler and will follow-up with his PCP.      Davonna Belling, MD 08/08/14 1524

## 2014-08-11 ENCOUNTER — Ambulatory Visit: Payer: Medicaid Other | Admitting: Family Medicine

## 2014-08-28 ENCOUNTER — Emergency Department (HOSPITAL_COMMUNITY)
Admission: EM | Admit: 2014-08-28 | Discharge: 2014-08-28 | Disposition: A | Discharge status: Home / Self Care | Payer: Medicaid Other | Attending: Emergency Medicine | Admitting: Emergency Medicine

## 2014-08-28 ENCOUNTER — Encounter (HOSPITAL_COMMUNITY): Payer: Self-pay | Admitting: *Deleted

## 2014-08-28 ENCOUNTER — Emergency Department (HOSPITAL_BASED_OUTPATIENT_CLINIC_OR_DEPARTMENT_OTHER): Admit: 2014-08-28 | Discharge: 2014-08-28 | Disposition: A | Discharge status: Home / Self Care | Payer: Medicaid Other

## 2014-08-28 DIAGNOSIS — Z8659 Personal history of other mental and behavioral disorders: Secondary | ICD-10-CM | POA: Diagnosis not present

## 2014-08-28 DIAGNOSIS — Z720 Tobacco use: Secondary | ICD-10-CM | POA: Diagnosis not present

## 2014-08-28 DIAGNOSIS — Z79899 Other long term (current) drug therapy: Secondary | ICD-10-CM | POA: Diagnosis not present

## 2014-08-28 DIAGNOSIS — J45909 Unspecified asthma, uncomplicated: Secondary | ICD-10-CM | POA: Diagnosis not present

## 2014-08-28 DIAGNOSIS — Z7951 Long term (current) use of inhaled steroids: Secondary | ICD-10-CM | POA: Insufficient documentation

## 2014-08-28 DIAGNOSIS — M79662 Pain in left lower leg: Secondary | ICD-10-CM

## 2014-08-28 DIAGNOSIS — M79605 Pain in left leg: Secondary | ICD-10-CM | POA: Diagnosis present

## 2014-08-28 MED ORDER — HYDROCODONE-ACETAMINOPHEN 5-325 MG PO TABS: 1.0000 | ORAL_TABLET | Freq: Four times a day (QID) | ORAL | Status: DC | PRN

## 2014-08-28 MED ORDER — OXYCODONE-ACETAMINOPHEN 5-325 MG PO TABS
1.0000 | ORAL_TABLET | Freq: Once | ORAL | Status: AC
  Administered 2014-08-28: 1 via ORAL
  Filled 2014-08-28: qty 1

## 2014-08-28 MED ORDER — NAPROXEN 500 MG PO TABS: 500.0000 mg | ORAL_TABLET | Freq: Two times a day (BID) | ORAL | Status: DC

## 2014-08-28 NOTE — Progress Notes (Signed)
VASCULAR LAB PRELIMINARY  PRELIMINARY  PRELIMINARY  PRELIMINARY  Left lower extremity venous Doppler completed.    Preliminary report:  There is no obvious evidence of DVT or SVT noted in the left lower extremity.  There is significant interstitial fluid noted throughout the left calf.  Perseus Westall, RVT 08/28/2014, 6:54 PM

## 2014-08-28 NOTE — Discharge Instructions (Signed)
Your ultrasound of your leg today shows that you do not have a blood clot. It is important that you follow up with Dr. Marcelino Scot as soon as possible for further evaluation of your pain and swelling.

## 2014-08-28 NOTE — ED Notes (Signed)
RN and NP notified radiologist on pending result of pt.'s ultrasound .
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
## 2014-08-29 ENCOUNTER — Encounter: Payer: Medicaid Other | Admitting: Cardiology

## 2014-08-29 ENCOUNTER — Encounter: Payer: Self-pay | Admitting: Cardiology

## 2014-08-29 NOTE — Progress Notes (Signed)
Patient canceled.  This encounter was created in error - please disregard. 

## 2014-09-05 ENCOUNTER — Encounter: Payer: Self-pay | Admitting: Cardiology

## 2014-09-08 ENCOUNTER — Ambulatory Visit (INDEPENDENT_AMBULATORY_CARE_PROVIDER_SITE_OTHER): Payer: Medicaid Other | Admitting: Family Medicine

## 2014-09-08 ENCOUNTER — Encounter: Payer: Self-pay | Admitting: Family Medicine

## 2014-09-08 VITALS — BP 124/82 | HR 100 | Resp 16 | Ht 65.0 in | Wt 285.0 lb

## 2014-09-08 DIAGNOSIS — F29 Unspecified psychosis not due to a substance or known physiological condition: Secondary | ICD-10-CM

## 2014-09-08 DIAGNOSIS — F17200 Nicotine dependence, unspecified, uncomplicated: Secondary | ICD-10-CM

## 2014-09-08 DIAGNOSIS — R7309 Other abnormal glucose: Secondary | ICD-10-CM

## 2014-09-08 DIAGNOSIS — E8881 Metabolic syndrome: Secondary | ICD-10-CM

## 2014-09-08 DIAGNOSIS — G4733 Obstructive sleep apnea (adult) (pediatric): Secondary | ICD-10-CM

## 2014-09-08 DIAGNOSIS — Z13220 Encounter for screening for lipoid disorders: Secondary | ICD-10-CM

## 2014-09-08 DIAGNOSIS — Z720 Tobacco use: Secondary | ICD-10-CM

## 2014-09-08 DIAGNOSIS — R7303 Prediabetes: Secondary | ICD-10-CM

## 2014-09-08 DIAGNOSIS — J45991 Cough variant asthma: Secondary | ICD-10-CM

## 2014-09-08 DIAGNOSIS — E785 Hyperlipidemia, unspecified: Secondary | ICD-10-CM

## 2014-09-08 DIAGNOSIS — M79605 Pain in left leg: Secondary | ICD-10-CM

## 2014-09-08 MED ORDER — BUDESONIDE-FORMOTEROL FUMARATE 80-4.5 MCG/ACT IN AERO: 2.0000 | INHALATION_SPRAY | Freq: Two times a day (BID) | RESPIRATORY_TRACT | Status: DC

## 2014-09-08 MED ORDER — MONTELUKAST SODIUM 10 MG PO TABS: 10.0000 mg | ORAL_TABLET | Freq: Every day | ORAL | Status: DC

## 2014-09-08 MED ORDER — ALBUTEROL SULFATE HFA 108 (90 BASE) MCG/ACT IN AERS: 2.0000 | INHALATION_SPRAY | Freq: Four times a day (QID) | RESPIRATORY_TRACT | Status: DC | PRN

## 2014-09-08 NOTE — Patient Instructions (Addendum)
F/u in 3.5 month, call if you need me before  Labs today, lipid, cmpand EGFR and HBA1C and CBC  Need to use symbicort inhaler every day and singulair both will help your breathing  Need to have albuterol for help wih wheezing   pLS use your CPAP every evening  Orthopedic Doc is working on leg pain and will be responsible for management of this  Allt the best  Please entirely stop smoking also  Please work on good  health habits so that your health will improve. 1. Commitment to daily physical activity for 30 to 60  minutes, if you are able to do this.  2. Commitment to wise food choices. Aim for half of your  food intake to be vegetable and fruit, one quarter starchy foods, and one quarter protein. Try to eat on a regular schedule  3 meals per day, snacking between meals should be limited to vegetables or fruits or small portions of nuts. 64 ounces of water per day is generally recommended, unless you have specific health conditions, like heart failure or kidney failure where you will need to limit fluid intake.  3. Commitment to sufficient and a  good quality of physical and mental rest daily, generally between 6 to 8 hours per day.  WITH PERSISTANCE AND PERSEVERANCE, THE IMPOSSIBLE , BECOMES THE NORM!   You are referred fo lung function test to assess breathing, and also to Dr Luan Pulling , have appt with him on 09/09/2014

## 2014-09-09 LAB — CBC WITH DIFFERENTIAL/PLATELET
Basophils Absolute: 0 10*3/uL (ref 0.0–0.1)
Basophils Relative: 0 % (ref 0–1)
Eosinophils Absolute: 0.1 10*3/uL (ref 0.0–0.7)
Eosinophils Relative: 1 % (ref 0–5)
HCT: 38.4 % — ABNORMAL LOW (ref 39.0–52.0)
Hemoglobin: 12.8 g/dL — ABNORMAL LOW (ref 13.0–17.0)
Lymphocytes Relative: 38 % (ref 12–46)
Lymphs Abs: 2.2 10*3/uL (ref 0.7–4.0)
MCH: 28.6 pg (ref 26.0–34.0)
MCHC: 33.3 g/dL (ref 30.0–36.0)
MCV: 85.7 fL (ref 78.0–100.0)
MPV: 9.4 fL (ref 8.6–12.4)
Monocytes Absolute: 0.5 10*3/uL (ref 0.1–1.0)
Monocytes Relative: 9 % (ref 3–12)
Neutro Abs: 3.1 10*3/uL (ref 1.7–7.7)
Neutrophils Relative %: 52 % (ref 43–77)
Platelets: 328 10*3/uL (ref 150–400)
RBC: 4.48 MIL/uL (ref 4.22–5.81)
RDW: 16.2 % — ABNORMAL HIGH (ref 11.5–15.5)
WBC: 5.9 10*3/uL (ref 4.0–10.5)

## 2014-09-09 LAB — COMPLETE METABOLIC PANEL WITH GFR
ALT: 19 U/L (ref 0–53)
AST: 15 U/L (ref 0–37)
Albumin: 4.3 g/dL (ref 3.5–5.2)
Alkaline Phosphatase: 66 U/L (ref 39–117)
BUN: 14 mg/dL (ref 6–23)
CO2: 28 mEq/L (ref 19–32)
Calcium: 9.5 mg/dL (ref 8.4–10.5)
Chloride: 102 mEq/L (ref 96–112)
Creat: 0.74 mg/dL (ref 0.50–1.35)
GFR, Est African American: >89 mL/min
GFR, Est Non African American: >89 mL/min
Glucose, Bld: 79 mg/dL (ref 70–99)
Potassium: 4 mEq/L (ref 3.5–5.3)
Sodium: 143 mEq/L (ref 135–145)
Total Bilirubin: 0.5 mg/dL (ref 0.2–1.2)
Total Protein: 7.6 g/dL (ref 6.0–8.3)

## 2014-09-09 LAB — LIPID PANEL
Cholesterol: 181 mg/dL (ref 0–200)
HDL: 31 mg/dL — ABNORMAL LOW (ref >=40)
LDL Cholesterol: 117 mg/dL — ABNORMAL HIGH (ref 0–99)
Total CHOL/HDL Ratio: 5.8 Ratio
Triglycerides: 166 mg/dL — ABNORMAL HIGH (ref <150)
VLDL: 33 mg/dL (ref 0–40)

## 2014-09-09 LAB — HEMOGLOBIN A1C
Hgb A1c MFr Bld: 6.1 % — ABNORMAL HIGH (ref <5.7)
Mean Plasma Glucose: 128 mg/dL — ABNORMAL HIGH (ref <117)

## 2014-09-10 ENCOUNTER — Other Ambulatory Visit: Payer: Self-pay | Admitting: Orthopedic Surgery

## 2014-09-10 ENCOUNTER — Telehealth: Payer: Self-pay | Admitting: Family Medicine

## 2014-09-10 DIAGNOSIS — R609 Edema, unspecified: Secondary | ICD-10-CM

## 2014-09-10 NOTE — Telephone Encounter (Signed)
[name] said that he hasn't been to [facility] or cardiology yet but she has rescheduled both appts. Shawn Ramsey is Aug 31 at 15 and cardiology is Sept 6th at 4. She is going to try to get sooner appts and will call back if she is able to. States he has a stress test scheduled for July 29th.

## 2014-09-10 NOTE — Telephone Encounter (Signed)
Noted, thanks!

## 2014-09-10 NOTE — Telephone Encounter (Signed)
Pls call mother [name] to discuss tests needed. I have received Dr Carlean Jews note. He states pt will need medical clearance if surgery is needed on his leg. Shyne was referred to cardiology at May visit, this was not followed up on, needs this, as he had c/o chest pain  He should have had appt with Dr Luan Pulling yesterday from record here , if not she needs to reschedule , also I referred him for lung function tests  pls give me f/u on this

## 2014-09-19 ENCOUNTER — Ambulatory Visit (HOSPITAL_COMMUNITY)
Admission: RE | Admit: 2014-09-19 | Discharge: 2014-09-19 | Disposition: A | Discharge status: Home / Self Care | Payer: Medicaid Other | Source: Ambulatory Visit | Attending: Family Medicine | Admitting: Family Medicine

## 2014-09-19 DIAGNOSIS — J45991 Cough variant asthma: Secondary | ICD-10-CM | POA: Insufficient documentation

## 2014-09-19 LAB — PULMONARY FUNCTION TEST
DL/VA % pred: 144 %
DL/VA: 6.17 ml/min/mmHg/L
DLCO unc % pred: 90 %
DLCO unc: 21.97 ml/min/mmHg
FEF 25-75 Post: 1.79 L/sec
FEF 25-75 Pre: 1.09 L/sec
FEF2575-%Change-Post: 63 %
FEF2575-%Pred-Post: 51 %
FEF2575-%Pred-Pre: 31 %
FEV1-%Change-Post: 13 %
FEV1-%Pred-Post: 62 %
FEV1-%Pred-Pre: 55 %
FEV1-Post: 1.91 L
FEV1-Pre: 1.68 L
FEV1FVC-%Change-Post: 5 %
FEV1FVC-%Pred-Pre: 84 %
FEV6-%Change-Post: 8 %
FEV6-%Pred-Post: 72 %
FEV6-%Pred-Pre: 66 %
FEV6-Post: 2.57 L
FEV6-Pre: 2.38 L
FEV6FVC-%Pred-Post: 102 %
FEV6FVC-%Pred-Pre: 102 %
FVC-%Change-Post: 8 %
FVC-%Pred-Post: 70 %
FVC-%Pred-Pre: 65 %
FVC-Post: 2.57 L
FVC-Pre: 2.38 L
Post FEV1/FVC ratio: 74 %
Post FEV6/FVC ratio: 100 %
Pre FEV1/FVC ratio: 71 %
Pre FEV6/FVC Ratio: 100 %
RV % pred: 127 %
RV: 1.77 L
TLC % pred: 73 %
TLC: 4.17 L

## 2014-09-19 MED ORDER — ALBUTEROL SULFATE (2.5 MG/3ML) 0.083% IN NEBU
2.5000 mg | INHALATION_SOLUTION | Freq: Once | RESPIRATORY_TRACT | Status: AC
  Administered 2014-09-19: 2.5 mg via RESPIRATORY_TRACT

## 2014-09-21 ENCOUNTER — Other Ambulatory Visit: Payer: Self-pay | Admitting: Orthopedic Surgery

## 2014-09-21 ENCOUNTER — Ambulatory Visit
Admission: RE | Admit: 2014-09-21 | Discharge: 2014-09-21 | Disposition: A | Discharge status: Home / Self Care | Payer: Medicaid Other | Source: Ambulatory Visit | Attending: Orthopedic Surgery | Admitting: Orthopedic Surgery

## 2014-09-21 DIAGNOSIS — R609 Edema, unspecified: Secondary | ICD-10-CM

## 2014-09-23 ENCOUNTER — Encounter: Payer: Self-pay | Admitting: Cardiology

## 2014-09-23 ENCOUNTER — Ambulatory Visit (INDEPENDENT_AMBULATORY_CARE_PROVIDER_SITE_OTHER): Payer: Medicaid Other | Admitting: Cardiology

## 2014-09-23 ENCOUNTER — Telehealth: Payer: Self-pay | Admitting: Cardiology

## 2014-09-23 VITALS — BP 130/78 | HR 87 | Ht 65.0 in | Wt 291.0 lb

## 2014-09-23 DIAGNOSIS — R0602 Shortness of breath: Secondary | ICD-10-CM | POA: Diagnosis not present

## 2014-09-23 DIAGNOSIS — J45909 Unspecified asthma, uncomplicated: Secondary | ICD-10-CM | POA: Diagnosis not present

## 2014-09-23 DIAGNOSIS — Z01810 Encounter for preprocedural cardiovascular examination: Secondary | ICD-10-CM
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
## 2014-09-23 NOTE — Patient Instructions (Signed)
Your physician has requested that you have an echocardiogram. Echocardiography is a painless test that uses sound waves to create images of your heart. It provides your doctor with information about the size and shape of your heart and how well your heart's chambers and valves are working. This procedure takes approximately one hour. There are no restrictions for this procedure. Office will contact with results via phone or letter.   Continue all current medications. Follow up based on results from above.   Dr. Luan Pulling office will put you on their cancellation list - will call you for earlier appointment as soon as possible.

## 2014-09-23 NOTE — Telephone Encounter (Signed)
Echo scheduled for 8/4 at [place]

## 2014-09-24 ENCOUNTER — Ambulatory Visit: Payer: Medicaid Other | Admitting: Cardiology

## 2014-09-24 NOTE — Telephone Encounter (Signed)
Medicaid only no pac rqd for echo

## 2014-09-25 ENCOUNTER — Ambulatory Visit (HOSPITAL_COMMUNITY)
Admission: RE | Admit: 2014-09-25 | Discharge: 2014-09-25 | Disposition: A | Discharge status: Home / Self Care | Payer: Medicaid Other | Source: Ambulatory Visit | Attending: Cardiology | Admitting: Cardiology

## 2014-09-25 DIAGNOSIS — R0602 Shortness of breath: Secondary | ICD-10-CM | POA: Diagnosis not present

## 2014-09-26 ENCOUNTER — Telehealth: Payer: Self-pay | Admitting: *Deleted

## 2014-09-26 NOTE — Telephone Encounter (Signed)
Patient's mother informed

## 2014-09-26 NOTE — Telephone Encounter (Signed)
-----   Message from Satira Sark, MD sent at 09/25/2014 12:39 PM EDT ----- Reviewed report. LVEF is normal range at 55-60%. As mentioned in the consultation note, pulmonary status looks to be main issue in terms of shortness of breath. Would recommend continued follow-up with Dr. [name] and pulmonary evaluation by Dr. Luan Pulling.

## 2014-10-10 ENCOUNTER — Other Ambulatory Visit: Payer: Self-pay

## 2014-10-10 MED ORDER — METFORMIN HCL ER 500 MG PO TB24: 500.0000 mg | ORAL_TABLET | Freq: Every day | ORAL | Status: DC

## 2014-10-15 ENCOUNTER — Ambulatory Visit: Payer: Medicaid Other | Admitting: Family Medicine

## 2014-10-20 ENCOUNTER — Encounter (HOSPITAL_COMMUNITY): Payer: Self-pay | Admitting: *Deleted

## 2014-10-20 NOTE — Progress Notes (Signed)
Called Dr. Carlean Jews office to request pulmonary clearance be faxed to [place] and that we need pre-op orders.
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
## 2014-10-21 ENCOUNTER — Ambulatory Visit (HOSPITAL_COMMUNITY)
Admission: RE | Admit: 2014-10-21 | Discharge: 2014-10-21 | Disposition: A | Discharge status: Home / Self Care | Payer: Medicaid Other | Source: Ambulatory Visit | Attending: Orthopedic Surgery | Admitting: Orthopedic Surgery

## 2014-10-21 ENCOUNTER — Encounter (HOSPITAL_COMMUNITY): Payer: Self-pay | Admitting: *Deleted

## 2014-10-21 ENCOUNTER — Ambulatory Visit (HOSPITAL_COMMUNITY): Payer: Medicaid Other | Admitting: Emergency Medicine

## 2014-10-21 ENCOUNTER — Encounter (HOSPITAL_COMMUNITY)
Admission: RE | Disposition: A | Discharge status: Home / Self Care | Payer: Self-pay | Source: Ambulatory Visit | Attending: Orthopedic Surgery

## 2014-10-21 ENCOUNTER — Ambulatory Visit (HOSPITAL_COMMUNITY): Payer: Medicaid Other

## 2014-10-21 DIAGNOSIS — Z419 Encounter for procedure for purposes other than remedying health state, unspecified: Secondary | ICD-10-CM

## 2014-10-21 DIAGNOSIS — Z6841 Body Mass Index (BMI) 40.0 and over, adult: Secondary | ICD-10-CM | POA: Insufficient documentation

## 2014-10-21 DIAGNOSIS — T8489XA Other specified complication of internal orthopedic prosthetic devices, implants and grafts, initial encounter: Secondary | ICD-10-CM | POA: Insufficient documentation

## 2014-10-21 DIAGNOSIS — F17210 Nicotine dependence, cigarettes, uncomplicated: Secondary | ICD-10-CM | POA: Diagnosis not present

## 2014-10-21 DIAGNOSIS — Y838 Other surgical procedures as the cause of abnormal reaction of the patient, or of later complication, without mention of misadventure at the time of the procedure: Secondary | ICD-10-CM | POA: Diagnosis not present

## 2014-10-21 DIAGNOSIS — E119 Type 2 diabetes mellitus without complications: Secondary | ICD-10-CM | POA: Insufficient documentation

## 2014-10-21 HISTORY — DX: Depression, unspecified: F32.A

## 2014-10-21 HISTORY — DX: Anxiety disorder, unspecified: F41.9

## 2014-10-21 HISTORY — PX: HARDWARE REMOVAL: SHX979

## 2014-10-21 HISTORY — DX: Pneumonia, unspecified organism: J18.9

## 2014-10-21 HISTORY — DX: Major depressive disorder, single episode, unspecified: F32.9

## 2014-10-21 HISTORY — DX: Sleep apnea, unspecified: G47.30

## 2014-10-21 LAB — BASIC METABOLIC PANEL
Anion gap: 8 (ref 5–15)
BUN: 14 mg/dL (ref 6–20)
CO2: 25 mmol/L (ref 22–32)
Calcium: 9.1 mg/dL (ref 8.9–10.3)
Chloride: 106 mmol/L (ref 101–111)
Creatinine, Ser: 0.79 mg/dL (ref 0.61–1.24)
GFR calc Af Amer: >60 mL/min (ref >60)
GFR calc non Af Amer: >60 mL/min (ref >60)
Glucose, Bld: 104 mg/dL — ABNORMAL HIGH (ref 65–99)
Potassium: 4.1 mmol/L (ref 3.5–5.1)
Sodium: 139 mmol/L (ref 135–145)

## 2014-10-21 LAB — CBC
HCT: 38.8 % — ABNORMAL LOW (ref 39.0–52.0)
Hemoglobin: 12.6 g/dL — ABNORMAL LOW (ref 13.0–17.0)
MCH: 28.8 pg (ref 26.0–34.0)
MCHC: 32.5 g/dL (ref 30.0–36.0)
MCV: 88.8 fL (ref 78.0–100.0)
Platelets: 293 10*3/uL (ref 150–400)
RBC: 4.37 MIL/uL (ref 4.22–5.81)
RDW: 14.7 % (ref 11.5–15.5)
WBC: 5.4 10*3/uL (ref 4.0–10.5)

## 2014-10-21 LAB — GLUCOSE, CAPILLARY
Glucose-Capillary: 102 mg/dL — ABNORMAL HIGH (ref 65–99)
Glucose-Capillary: 99 mg/dL (ref 65–99)
Glucose-Capillary: 130 mg/dL — ABNORMAL HIGH (ref 65–99)

## 2014-10-21 SURGERY — REMOVAL, HARDWARE
Anesthesia: General | Site: Knee | Laterality: Left

## 2014-10-21 MED ORDER — LACTATED RINGERS IV SOLN
INTRAVENOUS | Status: DC
  Administered 2014-10-21: 09:00:00 via INTRAVENOUS

## 2014-10-21 MED ORDER — TRAMADOL HCL 50 MG PO TABS
50.0000 mg | ORAL_TABLET | Freq: Once | ORAL | Status: AC
  Administered 2014-10-21: 100 mg via ORAL

## 2014-10-21 MED ORDER — BUPIVACAINE-EPINEPHRINE (PF) 0.5% -1:200000 IJ SOLN
INTRAMUSCULAR | Status: AC
  Filled 2014-10-21: qty 30

## 2014-10-21 MED ORDER — MEPERIDINE HCL 25 MG/ML IJ SOLN: 6.2500 mg | INTRAMUSCULAR | Status: DC | PRN

## 2014-10-21 MED ORDER — 0.9 % SODIUM CHLORIDE (POUR BTL) OPTIME
TOPICAL | Status: DC | PRN
  Administered 2014-10-21: 1000 mL

## 2014-10-21 MED ORDER — LIDOCAINE HCL (PF) 1 % IJ SOLN
INTRAMUSCULAR | Status: DC | PRN
  Administered 2014-10-21: 3 mL

## 2014-10-21 MED ORDER — TRAMADOL HCL 50 MG PO TABS: 50.0000 mg | ORAL_TABLET | Freq: Three times a day (TID) | ORAL | Status: DC | PRN

## 2014-10-21 MED ORDER — ONDANSETRON HCL 4 MG/2ML IJ SOLN: 4.0000 mg | Freq: Once | INTRAMUSCULAR | Status: DC | PRN

## 2014-10-21 MED ORDER — MIDAZOLAM HCL 2 MG/2ML IJ SOLN
INTRAMUSCULAR | Status: AC
  Filled 2014-10-21: qty 4

## 2014-10-21 MED ORDER — PROPOFOL 10 MG/ML IV BOLUS
INTRAVENOUS | Status: AC
  Filled 2014-10-21: qty 20

## 2014-10-21 MED ORDER — LABETALOL HCL 5 MG/ML IV SOLN
INTRAVENOUS | Status: DC | PRN
  Administered 2014-10-21 (×4): 5 mg via INTRAVENOUS

## 2014-10-21 MED ORDER — MIDAZOLAM HCL 5 MG/5ML IJ SOLN
INTRAMUSCULAR | Status: DC | PRN
  Administered 2014-10-21 (×2): 1 mg via INTRAVENOUS

## 2014-10-21 MED ORDER — LIDOCAINE HCL (PF) 1 % IJ SOLN
INTRAMUSCULAR | Status: AC
  Filled 2014-10-21: qty 30

## 2014-10-21 MED ORDER — TRAMADOL HCL 50 MG PO TABS
ORAL_TABLET | ORAL | Status: DC
Start: 2014-10-21 — End: 2014-10-21
  Filled 2014-10-21: qty 2

## 2014-10-21 MED ORDER — CHLORHEXIDINE GLUCONATE 4 % EX LIQD: 60.0000 mL | Freq: Once | CUTANEOUS | Status: DC

## 2014-10-21 MED ORDER — ONDANSETRON HCL 4 MG/2ML IJ SOLN
INTRAMUSCULAR | Status: AC
  Filled 2014-10-21: qty 2

## 2014-10-21 MED ORDER — LIDOCAINE HCL (CARDIAC) 20 MG/ML IV SOLN
INTRAVENOUS | Status: AC
  Filled 2014-10-21: qty 5

## 2014-10-21 MED ORDER — ONDANSETRON HCL 4 MG/2ML IJ SOLN
INTRAMUSCULAR | Status: DC | PRN
  Administered 2014-10-21: 4 mg via INTRAVENOUS

## 2014-10-21 MED ORDER — HYDROMORPHONE HCL 1 MG/ML IJ SOLN: 0.2500 mg | INTRAMUSCULAR | Status: DC | PRN

## 2014-10-21 MED ORDER — FENTANYL CITRATE (PF) 250 MCG/5ML IJ SOLN
INTRAMUSCULAR | Status: AC
  Filled 2014-10-21: qty 5

## 2014-10-21 MED ORDER — DEXAMETHASONE SODIUM PHOSPHATE 10 MG/ML IJ SOLN
INTRAMUSCULAR | Status: AC
  Filled 2014-10-21: qty 1

## 2014-10-21 MED ORDER — GLYCOPYRROLATE 0.2 MG/ML IJ SOLN
INTRAMUSCULAR | Status: AC
  Filled 2014-10-21: qty 4

## 2014-10-21 MED ORDER — LABETALOL HCL 5 MG/ML IV SOLN
INTRAVENOUS | Status: AC
  Filled 2014-10-21: qty 4

## 2014-10-21 MED ORDER — DEXTROSE 5 % IV SOLN
3.0000 g | INTRAVENOUS | Status: AC
  Administered 2014-10-21: 3 g via INTRAVENOUS
  Filled 2014-10-21: qty 3000

## 2014-10-21 MED ORDER — METHYLPREDNISOLONE ACETATE 80 MG/ML IJ SUSP
INTRAMUSCULAR | Status: DC | PRN
  Administered 2014-10-21: 80 mg via INTRA_ARTICULAR

## 2014-10-21 MED ORDER — KETOROLAC TROMETHAMINE 10 MG PO TABS: 10.0000 mg | ORAL_TABLET | Freq: Four times a day (QID) | ORAL | Status: DC | PRN

## 2014-10-21 MED ORDER — BUPIVACAINE-EPINEPHRINE 0.5% -1:200000 IJ SOLN
INTRAMUSCULAR | Status: DC | PRN
  Administered 2014-10-21: 2 mL
  Administered 2014-10-21: 27 mL

## 2014-10-21 MED ORDER — METHYLPREDNISOLONE ACETATE 40 MG/ML IJ SUSP
INTRAMUSCULAR | Status: AC
  Filled 2014-10-21: qty 2

## 2014-10-21 MED ORDER — FENTANYL CITRATE (PF) 100 MCG/2ML IJ SOLN
INTRAMUSCULAR | Status: DC | PRN
  Administered 2014-10-21 (×5): 50 ug via INTRAVENOUS

## 2014-10-21 MED ORDER — ROCURONIUM BROMIDE 50 MG/5ML IV SOLN
INTRAVENOUS | Status: AC
  Filled 2014-10-21: qty 1

## 2014-10-21 SURGICAL SUPPLY — 61 items
BANDAGE ELASTIC 4 VELCRO ST LF (GAUZE/BANDAGES/DRESSINGS) ×2 IMPLANT
BANDAGE ELASTIC 6 VELCRO ST LF (GAUZE/BANDAGES/DRESSINGS) ×2 IMPLANT
BANDAGE ESMARK 6X9 LF (GAUZE/BANDAGES/DRESSINGS) ×1 IMPLANT
BNDG COHESIVE 6X5 TAN STRL LF (GAUZE/BANDAGES/DRESSINGS) ×2 IMPLANT
BNDG ESMARK 6X9 LF (GAUZE/BANDAGES/DRESSINGS) ×2
BNDG GAUZE ELAST 4 BULKY (GAUZE/BANDAGES/DRESSINGS) ×2 IMPLANT
BRUSH SCRUB DISP (MISCELLANEOUS) ×4 IMPLANT
CLEANER TIP ELECTROSURG 2X2 (MISCELLANEOUS) ×2 IMPLANT
COVER SURGICAL LIGHT HANDLE (MISCELLANEOUS) ×4 IMPLANT
CUFF TOURNIQUET SINGLE 18IN (TOURNIQUET CUFF) IMPLANT
CUFF TOURNIQUET SINGLE 24IN (TOURNIQUET CUFF) IMPLANT
CUFF TOURNIQUET SINGLE 34IN LL (TOURNIQUET CUFF) IMPLANT
DRAPE C-ARM 42X72 X-RAY (DRAPES) IMPLANT
DRAPE C-ARMOR (DRAPES) ×2 IMPLANT
DRAPE OEC MINIVIEW 54X84 (DRAPES) ×2 IMPLANT
DRAPE U-SHAPE 47X51 STRL (DRAPES) ×2 IMPLANT
DRSG ADAPTIC 3X8 NADH LF (GAUZE/BANDAGES/DRESSINGS) ×2 IMPLANT
DRSG MEPITEL 3X4 ME34 (GAUZE/BANDAGES/DRESSINGS) ×2 IMPLANT
ELECT REM PT RETURN 9FT ADLT (ELECTROSURGICAL) ×2
ELECTRODE REM PT RTRN 9FT ADLT (ELECTROSURGICAL) ×1 IMPLANT
EVACUATOR 1/8 PVC DRAIN (DRAIN) IMPLANT
GAUZE SPONGE 4X4 12PLY STRL (GAUZE/BANDAGES/DRESSINGS) ×2 IMPLANT
GLOVE BIO SURGEON STRL SZ7.5 (GLOVE) ×2 IMPLANT
GLOVE BIO SURGEON STRL SZ8 (GLOVE) ×2 IMPLANT
GLOVE BIOGEL PI IND STRL 7.5 (GLOVE) ×1 IMPLANT
GLOVE BIOGEL PI IND STRL 8 (GLOVE) ×1 IMPLANT
GLOVE BIOGEL PI INDICATOR 7.5 (GLOVE) ×1
GLOVE BIOGEL PI INDICATOR 8 (GLOVE) ×1
GOWN STRL REUS W/ TWL LRG LVL3 (GOWN DISPOSABLE) ×2 IMPLANT
GOWN STRL REUS W/ TWL XL LVL3 (GOWN DISPOSABLE) ×1 IMPLANT
GOWN STRL REUS W/TWL LRG LVL3 (GOWN DISPOSABLE) ×2
GOWN STRL REUS W/TWL XL LVL3 (GOWN DISPOSABLE) ×1
KIT BASIN OR (CUSTOM PROCEDURE TRAY) ×2 IMPLANT
KIT ROOM TURNOVER OR (KITS) ×2 IMPLANT
MANIFOLD NEPTUNE II (INSTRUMENTS) ×2 IMPLANT
NEEDLE 22X1 1/2 (OR ONLY) (NEEDLE) IMPLANT
NS IRRIG 1000ML POUR BTL (IV SOLUTION) ×2 IMPLANT
PACK ORTHO EXTREMITY (CUSTOM PROCEDURE TRAY) ×2 IMPLANT
PAD ARMBOARD 7.5X6 YLW CONV (MISCELLANEOUS) ×4 IMPLANT
PADDING CAST COTTON 6X4 STRL (CAST SUPPLIES) ×6 IMPLANT
PIN GUIDE DRILL TIP 2.8X300 (DRILL) ×2 IMPLANT
SPONGE GAUZE 4X4 12PLY STER LF (GAUZE/BANDAGES/DRESSINGS) ×2 IMPLANT
SPONGE LAP 18X18 X RAY DECT (DISPOSABLE) ×2 IMPLANT
SPONGE SCRUB IODOPHOR (GAUZE/BANDAGES/DRESSINGS) ×2 IMPLANT
STAPLER VISISTAT 35W (STAPLE) IMPLANT
STOCKINETTE IMPERVIOUS LG (DRAPES) ×2 IMPLANT
STRIP CLOSURE SKIN 1/2X4 (GAUZE/BANDAGES/DRESSINGS) IMPLANT
SUCTION FRAZIER TIP 10 FR DISP (SUCTIONS) IMPLANT
SUT ETHILON 3 0 PS 1 (SUTURE) IMPLANT
SUT PDS AB 2-0 CT1 27 (SUTURE) IMPLANT
SUT VIC AB 0 CT1 27 (SUTURE)
SUT VIC AB 0 CT1 27XBRD ANBCTR (SUTURE) IMPLANT
SUT VIC AB 2-0 CT1 27 (SUTURE)
SUT VIC AB 2-0 CT1 TAPERPNT 27 (SUTURE) IMPLANT
SYR CONTROL 10ML LL (SYRINGE) IMPLANT
TOWEL OR 17X24 6PK STRL BLUE (TOWEL DISPOSABLE) ×4 IMPLANT
TOWEL OR 17X26 10 PK STRL BLUE (TOWEL DISPOSABLE) ×4 IMPLANT
TUBE CONNECTING 12X1/4 (SUCTIONS) ×2 IMPLANT
UNDERPAD 30X30 INCONTINENT (UNDERPADS AND DIAPERS) ×2 IMPLANT
WATER STERILE IRR 1000ML POUR (IV SOLUTION) ×4 IMPLANT
YANKAUER SUCT BULB TIP NO VENT (SUCTIONS) ×2 IMPLANT
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
## 2014-10-21 NOTE — Anesthesia Postprocedure Evaluation (Signed)
Anesthesia Post Note  Patient: Shawn Ramsey  Procedure(s) Performed: Procedure(s) (LRB): HARDWARE REMOVAL TIBIAL PLATEAU LEFT SIDE (Left)  Anesthesia type: general  Patient location: PACU  Post pain: Pain level controlled  Post assessment: Patient's Cardiovascular Status Stable  Last Vitals:  Filed Vitals:   10/21/14 1338  BP: 115/69  Pulse: 85  Temp:   Resp: 18    Post vital signs: Reviewed and stable  Level of consciousness: sedated  Complications: No apparent anesthesia complications

## 2014-10-21 NOTE — Discharge Instructions (Signed)
[facility] Discharge Instructions   General Discharge Instructions  WEIGHT BEARING STATUS: weightbearing as tolerated   RANGE OF MOTION/ACTIVITY: as tolerated   PAIN MEDICATION USE AND EXPECTATIONS  You have likely been given narcotic medications to help control your pain.  After a traumatic event that results in an fracture (broken bone) with or without surgery, it is ok to use narcotic pain medications to help control one's pain.  We understand that everyone responds to pain differently and each individual patient will be evaluated on a regular basis for the continued need for narcotic medications. Ideally, narcotic medication use should last no more than 6-8 weeks (coinciding with fracture healing).   As a patient it is your responsibility as well to monitor narcotic medication use and report the amount and frequency you use these medications when you come to your office visit.   We would also advise that if you are using narcotic medications, you should take a dose prior to therapy to maximize you participation.  IF YOU ARE ON NARCOTIC MEDICATIONS IT IS NOT PERMISSIBLE TO OPERATE A MOTOR VEHICLE (MOTORCYCLE/CAR/TRUCK/MOPED) OR HEAVY MACHINERY DO NOT MIX NARCOTICS WITH OTHER CNS (CENTRAL NERVOUS SYSTEM) DEPRESSANTS SUCH AS ALCOHOL  Diet: as you were eating previously.  Can use over the counter stool softeners and bowel preparations, such as Miralax, to help with bowel movements.  Narcotics can be constipating.  Be sure to drink plenty of fluids  Wound Care: daily wound care starting on 10/23/2014  STOP SMOKING OR USING NICOTINE PRODUCTS!!!!  As discussed nicotine severely impairs your body's ability to heal surgical and traumatic wounds but also impairs bone healing.  Wounds and bone heal by forming microscopic blood vessels (angiogenesis) and nicotine is a vasoconstrictor (essentially, shrinks blood vessels).  Therefore, if vasoconstriction occurs to these microscopic blood  vessels they essentially disappear and are unable to deliver necessary nutrients to the healing tissue.  This is one modifiable factor that you can do to dramatically increase your chances of healing your injury.    (This means no smoking, no nicotine gum, patches, etc)  DO NOT USE NONSTEROIDAL ANTI-INFLAMMATORY DRUGS (NSAID'S)  Using products such as Advil (ibuprofen), Aleve (naproxen), Motrin (ibuprofen) for additional pain control during fracture healing can delay and/or prevent the healing response.  If you would like to take over the counter (OTC) medication, Tylenol (acetaminophen) is ok.  However, some narcotic medications that are given for pain control contain acetaminophen as well. Therefore, you should not exceed more than 4000 mg of tylenol in a day if you do not have liver disease.  Also note that there are may OTC medicines, such as cold medicines and allergy medicines that my contain tylenol as well.  If you have any questions about medications and/or interactions please ask your doctor/PA or your pharmacist.      ICE AND ELEVATE INJURED/OPERATIVE EXTREMITY  Using ice and elevating the injured extremity above your heart can help with swelling and pain control.  Icing in a pulsatile fashion, such as 20 minutes on and 20 minutes off, can be followed.    Do not place ice directly on skin. Make sure there is a barrier between to skin and the ice pack.    Using frozen items such as frozen peas works well as the conform nicely to the are that needs to be iced.  USE AN ACE WRAP OR TED HOSE FOR SWELLING CONTROL  In addition to icing and elevation, Ace wraps or TED hose are used  to help limit and resolve swelling.  It is recommended to use Ace wraps or TED hose until you are informed to stop.    When using Ace Wraps start the wrapping distally (farthest away from the body) and wrap proximally (closer to the body)   Example: If you had surgery on your leg or thing and you do not have a splint on,  start the ace wrap at the toes and work your way up to the thigh        If you had surgery on your upper extremity and do not have a splint on, start the ace wrap at your fingers and work your way up to the upper arm  IF YOU ARE IN A SPLINT OR CAST DO NOT [place]   If your splint gets wet for any reason please contact the office immediately. You may shower in your splint or cast as long as you keep it dry.  This can be done by wrapping in a cast cover or garbage back (or similar)  Do Not stick any thing down your splint or cast such as pencils, money, or hangers to try and scratch yourself with.  If you feel itchy take benadryl as prescribed on the bottle for itching  IF YOU ARE IN A CAM BOOT (BLACK BOOT)  You may remove boot periodically. Perform daily dressing changes as noted below.  Wash the liner of the boot regularly and wear a sock when wearing the boot. It is recommended that you sleep in the boot until told otherwise  CALL THE OFFICE WITH ANY QUESTIONS OR CONCERTS: [phone]     Discharge Pin Site Instructions  Dress pins daily with Kerlix roll starting on POD 2. Wrap the Kerlix so that it tamps the skin down around the pin-skin interface to prevent/limit motion of the skin relative to the pin.  (Pin-skin motion is the primary cause of pain and infection related to external fixator pin sites).  Remove any crust or coagulum that may obstruct drainage with a saline moistened gauze or soap and water.  After POD 3, if there is no discernable drainage on the pin site dressing, the interval for change can by increased to every other day.  You may shower with the fixator, cleaning all pin sites gently with soap and water.  If you have a surgical wound this needs to be completely dry and without drainage before showering.  The extremity can be lifted by the fixator to facilitate wound care and transfers.  Notify the office/Doctor if you experience increasing drainage,  redness, or pain from a pin site, or if you notice purulent (thick, snot-like) drainage.  Discharge Wound Care Instructions  Do NOT apply any ointments, solutions or lotions to pin sites or surgical wounds.  These prevent needed drainage and even though solutions like hydrogen peroxide kill bacteria, they also damage cells lining the pin sites that help fight infection.  Applying lotions or ointments can keep the wounds moist and can cause them to breakdown and open up as well. This can increase the risk for infection. When in doubt call the office.  Surgical incisions should be dressed daily.  If any drainage is noted, use one layer of adaptic, then gauze, Kerlix, and an ace wrap.  Once the incision is completely dry and without drainage, it may be left open to air out.  Showering may begin 36-48 hours later.  Cleaning gently with soap and water.  Traumatic wounds should  be dressed daily as well.    One layer of adaptic, gauze, Kerlix, then ace wrap.  The adaptic can be discontinued once the draining has ceased    If you have a wet to dry dressing: wet the gauze with saline the squeeze as much saline out so the gauze is moist (not soaking wet), place moistened gauze over wound, then place a dry gauze over the moist one, followed by Kerlix wrap, then ace wrap.

## 2014-10-21 NOTE — Brief Op Note (Signed)
10/21/2014  12:38 PM  PATIENT:  Shawn Ramsey  35 y.o. male  PRE-OPERATIVE DIAGNOSIS:   1. SYMPTOMATIC HARDWARE LEFT TIBIAL PLATEAU 2. LEFT KNEE ARTHRITIS  POST-OPERATIVE DIAGNOSIS:   1. SYMPTOMATIC HARDWARE LEFT TIBIAL PLATEAU 2. LEFT KNEE ARTHRITIS  PROCEDURE:  Procedure(s): 1. HARDWARE REMOVAL TIBIAL PLATEAU LEFT SIDE (Left) 2. LEFT KNEE STEROID INJECTION  SURGEON:  Surgeon(s) and Role:    * Altamese [place], MD - Primary  PHYSICIAN ASSISTANT: None  ANESTHESIA:   MAC with local  I/O:  Total I/O In: 600 [I.V.:600] Out: -   SPECIMEN:  No Specimen  TOURNIQUET:  * No tourniquets in log *  DICTATION: .Other Dictation: Dictation Number [id number]

## 2014-10-21 NOTE — Anesthesia Preprocedure Evaluation (Signed)
Anesthesia Evaluation  Patient identified by MRN, date of birth, ID band Patient awake    Reviewed: Allergy & Precautions, NPO status , Patient's Chart, lab work & pertinent test results  Airway Mallampati: II  TM Distance: >3 FB Neck ROM: Full    Dental   Pulmonary sleep apnea and Continuous Positive Airway Pressure Ventilation , Current Smoker,    Pulmonary exam normal       Cardiovascular Normal cardiovascular exam    Neuro/Psych Anxiety Depression Schizophrenia    GI/Hepatic   Endo/Other  diabetes  Renal/GU      Musculoskeletal   Abdominal   Peds  Hematology   Anesthesia Other Findings   Reproductive/Obstetrics                             Anesthesia Physical Anesthesia Plan  ASA: III  Anesthesia Plan: MAC   Post-op Pain Management:    Induction: Intravenous  Airway Management Planned: Natural Airway  Additional Equipment:   Intra-op Plan:   Post-operative Plan:   Informed Consent: I have reviewed the patients History and Physical, chart, labs and discussed the procedure including the risks, benefits and alternatives for the proposed anesthesia with the patient or authorized representative who has indicated his/her understanding and acceptance.     Plan Discussed with: CRNA and Surgeon  Anesthesia Plan Comments:         Anesthesia Quick Evaluation

## 2014-10-21 NOTE — Transfer of Care (Signed)
Immediate Anesthesia Transfer of Care Note  Patient: Shawn Ramsey  Procedure(s) Performed: Procedure(s): HARDWARE REMOVAL TIBIAL PLATEAU LEFT SIDE (Left)  Patient Location: PACU  Anesthesia Type:MAC  Level of Consciousness: awake, alert  and oriented  Airway & Oxygen Therapy: Patient Spontanous Breathing and Patient connected to face mask oxygen  Post-op Assessment: Report given to RN, Post -op Vital signs reviewed and stable and Patient moving all extremities X 4  Post vital signs: Reviewed and stable  Last Vitals:  Filed Vitals:   10/21/14 0819  BP: 140/86  Pulse: 99  Temp: 36.2 C  Resp: 20    Complications: No apparent anesthesia complications

## 2014-10-22 ENCOUNTER — Encounter (HOSPITAL_COMMUNITY): Payer: Self-pay | Admitting: Orthopedic Surgery

## 2014-10-22 NOTE — Op Note (Signed)
NAMESINJIN, AMERO              ACCOUNT NO.:  [id number]  MEDICAL RECORD NO.:  [id number]  LOCATION:  [facility]                         FACILITY:  [facility]  PHYSICIAN:  Astrid Divine. Marcelino Scot, M.D. DATE OF BIRTH:  07/16/1980  DATE OF PROCEDURE:  10/21/2014 DATE OF DISCHARGE:  10/21/2014                              OPERATIVE REPORT   PREOPERATIVE DIAGNOSES: 1. Symptomatic hardware, left tibial plateau, cannulated screws. 2. Left knee arthritis.  POSTOPERATIVE DIAGNOSES: 1. Symptomatic hardware, left tibial plateau, cannulated screws. 2. Left knee arthritis.  PROCEDURES: 1. Removal of left proximal tibia hardware. 2. Left knee steroid injection.  SURGEON:  Astrid Divine. Marcelino Scot, M.D.  ASSISTANT:  None.  ANESTHESIA:  MAC with local.  ESTIMATED BLOOD LOSS:  Minimal.  TOURNIQUET:  None.  DISPOSITION:  To PACU.  CONDITION:  Stable.  BRIEF SUMMARY OF INDICATION FOR PROCEDURE:  Shawn Ramsey is a 34 year old male, status post tibial plateau fracture, treated with ORIF.  The patient has continually complain of pain at the head of the screws and tenderness and knee pain.  X-rays do demonstrate some mild posttraumatic changes.  I did discuss with the patient the risks and benefits of hardware removal including the possibility of failure to alleviate symptoms, need for further surgery, DVT, PE, progression of arthritis and multiple others.  He acknowledged these risks and did wish to proceed.  BRIEF SUMMARY OF PROCEDURE:  Shawn Ramsey was given 2 g of Ancef preoperatively.  He was taken to the operative room where MAC anesthesia was initiated.  His left medial plateau area was then injected with Marcaine around the head of the screws and skin.  A standard prep and drape was performed and then the old incisions were remade after time- out.  Dissection was carried down with a knife, curette and then a guidepin into the center of the screws.  Screwdriver was advanced over this and the screw  removed posteriorly easy out on two of the screws. The wound was irrigated thoroughly and then closed in layered fashion with 2-0 Vicryl, 3-0 nylon for the most anterior incision and then a 3-0 nylon for the remaining incisions.  The knee joint itself was injected with approximately 3 mL of lidocaine just over 60 mg of Depo-Medrol and 2 mL of Marcaine.  Sterile gently compressive dressing was applied.  The patient was awakened from Anesthesia and transported to the PACU in stable condition.  PROGNOSIS:  Shawn Ramsey will be weightbearing as tolerated and can shower in 2 days.  I will plan to see him back in the office in 2 weeks for removal of sutures.     Astrid Divine. Marcelino Scot, M.D.     MHH/MEDQ  D:  10/21/2014  T:  10/22/2014  Job:  [id number]

## 2014-10-28 ENCOUNTER — Ambulatory Visit: Payer: Medicaid Other | Admitting: Cardiology

## 2014-11-03 DIAGNOSIS — M79605 Pain in left leg: Secondary | ICD-10-CM | POA: Insufficient documentation

## 2014-11-03 DIAGNOSIS — E785 Hyperlipidemia, unspecified: Secondary | ICD-10-CM | POA: Insufficient documentation

## 2014-11-03 DIAGNOSIS — M25562 Pain in left knee: Secondary | ICD-10-CM | POA: Insufficient documentation

## 2014-11-03 DIAGNOSIS — E1169 Type 2 diabetes mellitus with other specified complication: Secondary | ICD-10-CM | POA: Insufficient documentation

## 2014-11-03 DIAGNOSIS — E669 Obesity, unspecified: Secondary | ICD-10-CM

## 2014-11-03 NOTE — Assessment & Plan Note (Signed)
Deteriorated. Patient re-educated about  the importance of commitment to a  minimum of 150 minutes of exercise per week.  The importance of healthy food choices with portion control discussed. Encouraged to start a food diary, count calories and to consider  joining a support group. Sample diet sheets offered. Goals set by the patient for the next several months.   Weight /BMI 10/21/2014 09/23/2014 09/08/2014  WEIGHT 285 lb 291 lb 285 lb  HEIGHT 5\' 5"  5\' 5"  5\' 5"   BMI 47.43 kg/m2 48.43 kg/m2 47.43 kg/m2    Current exercise per week 40 minutes.

## 2014-11-03 NOTE — Assessment & Plan Note (Signed)
The increased risk of cardiovascular disease associated with this diagnosis, and the need to consistently work on lifestyle to change this is discussed. Following  a  heart healthy diet ,commitment to 30 minutes of exercise at least 5 days per week, as well as control of blood sugar and cholesterol , and achieving a healthy weight are all the areas to be addressed .  
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
## 2014-11-03 NOTE — Assessment & Plan Note (Signed)
Stable, treated by psychiatry 

## 2014-11-03 NOTE — Assessment & Plan Note (Signed)
Unchanged Patient educated about the importance of limiting  Carbohydrate intake , the need to commit to daily physical activity for a minimum of 30 minutes , and to commit weight loss. The fact that changes in all these areas will reduce or eliminate all together the development of diabetes is stressed.   Diabetic Labs Latest Ref Rng 10/21/2014 09/08/2014 05/29/2014 03/18/2014 03/17/2014  HbA1c <5.7 % - 6.1(H) 6.1(H) - -  Chol 0 - 200 mg/dL - 181 198 - -  HDL >=40 mg/dL - 31(L) 26(L) - -  Calc LDL 0 - 99 mg/dL - 117(H) 143(H) - -  Triglycerides <150 mg/dL - 166(H) 143 - -  Creatinine 0.61 - 1.24 mg/dL 0.79 0.74 0.66 0.93 0.95   BP/Weight 10/21/2014 09/23/2014 09/08/2014 08/28/2014 08/07/2014 06/22/4816 06/29/929  Systolic BP 121 624 469 507 225 750 518  Diastolic BP 69 78 82 84 93 84 84  Wt. (Lbs) 285 291 285 - 274 270 272.4  BMI 47.43 48.43 47.43 - 45.6 44.93 45.33   No flowsheet data found.

## 2014-11-03 NOTE — Assessment & Plan Note (Signed)
Hyperlipidemia:Low fat diet discussed and encouraged.   Lipid Panel  Lab Results  Component Value Date   CHOL 181 09/08/2014   HDL 31* 09/08/2014   LDLCALC 117* 09/08/2014   TRIG 166* 09/08/2014   CHOLHDL 5.8 09/08/2014   Needs to reduce fried and fatty foods

## 2014-11-03 NOTE — Assessment & Plan Note (Signed)
Importance of using the CPAP machine daily is stressed

## 2014-11-03 NOTE — Assessment & Plan Note (Signed)
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
## 2014-11-03 NOTE — Assessment & Plan Note (Signed)
Uncontrolled with excess cough and wheeze, Needs to commit to maintainance meds Refer for PFT and to pulmobnary

## 2014-11-20 ENCOUNTER — Emergency Department (HOSPITAL_COMMUNITY)
Admission: EM | Admit: 2014-11-20 | Discharge: 2014-11-20 | Disposition: A | Discharge status: Home / Self Care | Payer: Medicaid Other | Attending: Emergency Medicine | Admitting: Emergency Medicine

## 2014-11-20 ENCOUNTER — Emergency Department (HOSPITAL_COMMUNITY): Payer: Medicaid Other

## 2014-11-20 ENCOUNTER — Encounter (HOSPITAL_COMMUNITY): Payer: Self-pay | Admitting: Emergency Medicine

## 2014-11-20 DIAGNOSIS — M791 Myalgia: Secondary | ICD-10-CM | POA: Diagnosis not present

## 2014-11-20 DIAGNOSIS — G4700 Insomnia, unspecified: Secondary | ICD-10-CM | POA: Insufficient documentation

## 2014-11-20 DIAGNOSIS — Z7951 Long term (current) use of inhaled steroids: Secondary | ICD-10-CM | POA: Diagnosis not present

## 2014-11-20 DIAGNOSIS — M7989 Other specified soft tissue disorders: Secondary | ICD-10-CM | POA: Diagnosis not present

## 2014-11-20 DIAGNOSIS — F329 Major depressive disorder, single episode, unspecified: Secondary | ICD-10-CM | POA: Insufficient documentation

## 2014-11-20 DIAGNOSIS — Z8701 Personal history of pneumonia (recurrent): Secondary | ICD-10-CM | POA: Insufficient documentation

## 2014-11-20 DIAGNOSIS — F419 Anxiety disorder, unspecified: Secondary | ICD-10-CM | POA: Diagnosis not present

## 2014-11-20 DIAGNOSIS — Z720 Tobacco use: Secondary | ICD-10-CM | POA: Diagnosis not present

## 2014-11-20 DIAGNOSIS — Z87828 Personal history of other (healed) physical injury and trauma: Secondary | ICD-10-CM | POA: Insufficient documentation

## 2014-11-20 DIAGNOSIS — J45909 Unspecified asthma, uncomplicated: Secondary | ICD-10-CM | POA: Diagnosis not present

## 2014-11-20 DIAGNOSIS — Z79899 Other long term (current) drug therapy: Secondary | ICD-10-CM | POA: Insufficient documentation

## 2014-11-20 DIAGNOSIS — E119 Type 2 diabetes mellitus without complications: Secondary | ICD-10-CM | POA: Insufficient documentation

## 2014-11-20 DIAGNOSIS — Z8679 Personal history of other diseases of the circulatory system: Secondary | ICD-10-CM | POA: Diagnosis not present

## 2014-11-20 DIAGNOSIS — M79605 Pain in left leg: Secondary | ICD-10-CM | POA: Diagnosis present

## 2014-11-20 MED ORDER — TRAMADOL HCL 50 MG PO TABS: 25.0000 mg | ORAL_TABLET | Freq: Four times a day (QID) | ORAL | Status: DC | PRN

## 2014-11-20 MED ORDER — OXYCODONE-ACETAMINOPHEN 5-325 MG PO TABS
2.0000 | ORAL_TABLET | Freq: Once | ORAL | Status: AC
  Administered 2014-11-20: 2 via ORAL
  Filled 2014-11-20: qty 2
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
## 2014-12-11 ENCOUNTER — Encounter: Payer: Self-pay | Admitting: *Deleted

## 2014-12-11 ENCOUNTER — Ambulatory Visit: Payer: Medicaid Other | Admitting: Family Medicine

## 2014-12-24 ENCOUNTER — Encounter: Payer: Self-pay | Admitting: Family Medicine

## 2014-12-24 ENCOUNTER — Ambulatory Visit (INDEPENDENT_AMBULATORY_CARE_PROVIDER_SITE_OTHER): Payer: Medicaid Other | Admitting: Family Medicine

## 2014-12-24 VITALS — BP 122/80 | HR 94 | Resp 20 | Ht 65.0 in | Wt 305.0 lb

## 2014-12-24 DIAGNOSIS — R7303 Prediabetes: Secondary | ICD-10-CM

## 2014-12-24 DIAGNOSIS — G4730 Sleep apnea, unspecified: Secondary | ICD-10-CM

## 2014-12-24 DIAGNOSIS — Z09 Encounter for follow-up examination after completed treatment for conditions other than malignant neoplasm: Secondary | ICD-10-CM | POA: Diagnosis not present

## 2014-12-24 DIAGNOSIS — E662 Morbid (severe) obesity with alveolar hypoventilation: Secondary | ICD-10-CM

## 2014-12-24 DIAGNOSIS — F17200 Nicotine dependence, unspecified, uncomplicated: Secondary | ICD-10-CM

## 2014-12-24 DIAGNOSIS — E785 Hyperlipidemia, unspecified: Secondary | ICD-10-CM

## 2014-12-24 DIAGNOSIS — J45991 Cough variant asthma: Secondary | ICD-10-CM

## 2014-12-24 NOTE — Patient Instructions (Addendum)
F/u in early January, call if you need me before  You NEED to take singulair and use dulera EVERY day to reduce risk of asthma getting uncontrolled  Pls finish all doxycycine  You are Referred to [facility] for help with sleep apnea, need this treated  BP is normal  Keep f/u with Dr Luan Pulling about your asthma   HBA1C, fasting lipid, cmp and eGFR in January

## 2014-12-24 NOTE — Progress Notes (Signed)
Subjective:    Patient ID: Shawn Ramsey, male    DOB: 10/07/1980, 34 y.o.   MRN: [id number]  HPI Recently treated at [facility] for asthma flare. Main concern in d/c summary was his severe sleep apnea, which he is not being treated as he fails to keep appts and to use the equipment. I again re educate both pt and his mom of how vital this is. Also he has not been using hios daily preventive asthma inhaler as prescried and is having exacerbations of his asthma Hospital d/c info is reviewed at visit   Review of Systems    See HPI Denies recent fever or chills.c/o fatigue and excess daytime sleepiness Denies sinus pressure, nasal congestion, ear pain or sore throat. Chronic severe wheezing and shortness of breath Denies chest pains, palpitations and leg swelling Denies abdominal pain, nausea, vomiting,diarrhea or constipation.   Denies dysuria, frequency, hesitancy or incontinence. Chronic  joint pain, swelling and limitation in mobility. Denies headaches, seizures, numbness, or tingling. Denies depression, anxiety or insomnia. Denies skin break down or rash.     Objective:   Physical Exam  BP 122/80 mmHg  Pulse 94  Resp 20  Ht 5\' 5"  (1.651 m)  Wt 305 lb (138.347 kg)  BMI 50.75 kg/m2  SpO2 95% Patient alert and oriented and in mild cardiopulmonary distress.Morbidly obese, noisy breathing even when awake  HEENT: No facial asymmetry, EOMI,   oropharynx pink and moist.  Neck supple no JVD, no mass.  Chest: decreased air entry, bilateral wheeze, no crackles  CVS: S1, S2 no murmurs, no S3.Regular rate.  ABD: Soft non tender.   Ext: No edema  MS: Adequate though reduced  ROM spine, shoulders, hips and knees.  Skin: Intact, no ulcerations or rash noted.  Psych: Good eye contact, normal affect. Memory impaired, mild MR, not anxious or depressed appearing.  CNS: CN 2-12 intact, power,  normal throughout.no focal deficits noted.       Assessment & Plan:  Hospital  discharge follow-up Hospitalized at [facility] for acute on chronic respiratory failure from uncontrolled asthma and severe sleepp apnea, which he is not compliant ablout. Improved, but still needs to obtain maintenance medication and have close f/u both his sleep and lung specialistst  NICOTINE ADDICTION Patient counseled for approximately 5 minutes regarding the health risks of ongoing nicotine use, specifically all types of cancer, heart disease, stroke and respiratory failure. The options available for help with cessation ,the behavioral changes to assist the process, and the option to either gradully reduce usage  Or abruptly stop.is also discussed. Pt is also encouraged to set specific goals in number of cigarettes used daily, as well as to set a quit date.  Number of cigarettes/cigars currently smoking daily: 3   Sleep apnea Severe and untreated needs soonest available appt to re establish ongoing care with specialist  COUGH VARIANT ASTHMA Not well managed, non compliant with maintainance therapy re educated re importance of this  Morbid obesity Deteriorated. Patient re-educated about  the importance of commitment to a  minimum of 150 minutes of exercise per week.  The importance of healthy food choices with portion control discussed. Encouraged to start a food diary, count calories and to consider  joining a support group. Sample diet sheets offered. Goals set by the patient for the next several months.   Weight /BMI 01/06/2015 12/24/2014 11/20/2014  WEIGHT 306 lb 305 lb 299 lb  HEIGHT 5\' 5"  5\' 5"  5\' 5"   BMI 50.92 kg/m2 50.75  kg/m2 49.76 kg/m2    Current exercise per week 0 minutes.

## 2014-12-25 ENCOUNTER — Telehealth: Payer: Self-pay | Admitting: Family Medicine

## 2014-12-25 NOTE — Telephone Encounter (Signed)
Patients mother Butch Penny is calling stating that she thinks Shawn Ramsey needs oxygen he is having difficulty breathing, please advise?

## 2014-12-26 NOTE — Telephone Encounter (Signed)
Loma Sousa called and spoke with Shawn Ramsey and told her if he was having difficulty breathing he needed to go to the ER

## 2015-01-02 ENCOUNTER — Other Ambulatory Visit (HOSPITAL_COMMUNITY): Payer: Self-pay | Admitting: Respiratory Therapy

## 2015-01-02 DIAGNOSIS — G4733 Obstructive sleep apnea (adult) (pediatric): Secondary | ICD-10-CM

## 2015-01-05 ENCOUNTER — Telehealth: Payer: Self-pay | Admitting: Family Medicine

## 2015-01-05 ENCOUNTER — Ambulatory Visit: Payer: Medicaid Other | Admitting: Family Medicine

## 2015-01-05 NOTE — Telephone Encounter (Signed)
Yellowish nasal congestion started yesterday and no fever, chills or bodyaches. Advised to try sudafed 2x daily x 2 days and to call back in 2 days if symptoms didn't improve

## 2015-01-05 NOTE — Telephone Encounter (Signed)
Shawn Ramsey is calling asking if Dr. [name] would Call in [place] an antibiotic for the last 2 days he has yellow/green mucos from his nose and sputum, denies fever, please advise?

## 2015-01-06 ENCOUNTER — Emergency Department (HOSPITAL_COMMUNITY): Payer: Medicaid Other

## 2015-01-06 ENCOUNTER — Encounter (HOSPITAL_COMMUNITY): Payer: Self-pay | Admitting: Emergency Medicine

## 2015-01-06 ENCOUNTER — Emergency Department (HOSPITAL_COMMUNITY)
Admission: EM | Admit: 2015-01-06 | Discharge: 2015-01-06 | Disposition: A | Discharge status: Home / Self Care | Payer: Medicaid Other | Attending: Emergency Medicine | Admitting: Emergency Medicine

## 2015-01-06 DIAGNOSIS — R109 Unspecified abdominal pain: Secondary | ICD-10-CM | POA: Insufficient documentation

## 2015-01-06 DIAGNOSIS — Z7951 Long term (current) use of inhaled steroids: Secondary | ICD-10-CM | POA: Diagnosis not present

## 2015-01-06 DIAGNOSIS — Z8679 Personal history of other diseases of the circulatory system: Secondary | ICD-10-CM | POA: Diagnosis not present

## 2015-01-06 DIAGNOSIS — F419 Anxiety disorder, unspecified: Secondary | ICD-10-CM | POA: Insufficient documentation

## 2015-01-06 DIAGNOSIS — M7989 Other specified soft tissue disorders: Secondary | ICD-10-CM | POA: Diagnosis not present

## 2015-01-06 DIAGNOSIS — Z7984 Long term (current) use of oral hypoglycemic drugs: Secondary | ICD-10-CM | POA: Insufficient documentation

## 2015-01-06 DIAGNOSIS — Z8701 Personal history of pneumonia (recurrent): Secondary | ICD-10-CM | POA: Diagnosis not present

## 2015-01-06 DIAGNOSIS — J45901 Unspecified asthma with (acute) exacerbation: Secondary | ICD-10-CM | POA: Insufficient documentation

## 2015-01-06 DIAGNOSIS — R0602 Shortness of breath: Secondary | ICD-10-CM | POA: Diagnosis present

## 2015-01-06 DIAGNOSIS — F17210 Nicotine dependence, cigarettes, uncomplicated: Secondary | ICD-10-CM | POA: Diagnosis not present

## 2015-01-06 DIAGNOSIS — Z87828 Personal history of other (healed) physical injury and trauma: Secondary | ICD-10-CM | POA: Insufficient documentation

## 2015-01-06 DIAGNOSIS — G4730 Sleep apnea, unspecified: Secondary | ICD-10-CM | POA: Insufficient documentation

## 2015-01-06 DIAGNOSIS — Z792 Long term (current) use of antibiotics: Secondary | ICD-10-CM | POA: Diagnosis not present

## 2015-01-06 DIAGNOSIS — Z79899 Other long term (current) drug therapy: Secondary | ICD-10-CM | POA: Diagnosis not present

## 2015-01-06 DIAGNOSIS — F329 Major depressive disorder, single episode, unspecified: Secondary | ICD-10-CM | POA: Diagnosis not present

## 2015-01-06 DIAGNOSIS — H539 Unspecified visual disturbance: Secondary | ICD-10-CM | POA: Insufficient documentation

## 2015-01-06 MED ORDER — IPRATROPIUM-ALBUTEROL 0.5-2.5 (3) MG/3ML IN SOLN
3.0000 mL | Freq: Once | RESPIRATORY_TRACT | Status: AC
  Administered 2015-01-06: 3 mL via RESPIRATORY_TRACT
  Filled 2015-01-06: qty 3

## 2015-01-06 MED ORDER — ALBUTEROL SULFATE HFA 108 (90 BASE) MCG/ACT IN AERS
2.0000 | INHALATION_SPRAY | Freq: Four times a day (QID) | RESPIRATORY_TRACT | Status: DC
  Administered 2015-01-06: 2 via RESPIRATORY_TRACT
  Filled 2015-01-06: qty 6.7

## 2015-01-06 MED ORDER — PREDNISONE 50 MG PO TABS
60.0000 mg | ORAL_TABLET | Freq: Once | ORAL | Status: AC
  Administered 2015-01-06: 60 mg via ORAL
  Filled 2015-01-06 (×2): qty 1

## 2015-01-06 MED ORDER — PREDNISONE 10 MG PO TABS: 40.0000 mg | ORAL_TABLET | Freq: Every day | ORAL | Status: DC

## 2015-01-06 NOTE — ED Notes (Signed)
Patient complaining of shortness of breath since last night. States he was just discharged form [facility] 1 week ago for same. States he has history of asthma and uses inhaler. Denies chest pain.

## 2015-01-06 NOTE — ED Notes (Signed)
MD at bedside. 

## 2015-01-06 NOTE — Telephone Encounter (Signed)
His mother Shawn Ramsey is also asking for a nebulizer due to Shawn Ramsey having to go to the hospital for breathing treatments, please advise?

## 2015-01-06 NOTE — Discharge Instructions (Signed)
Asthma, Adult Asthma is a condition of the lungs in which the airways tighten and narrow. Asthma can make it hard to breathe. Asthma cannot be cured, but medicine and lifestyle changes can help control it. Asthma may be started (triggered) by:  Animal skin flakes (dander).  Dust.  Cockroaches.  Pollen.  Mold.  Smoke.  Cleaning products.  Hair sprays or aerosol sprays.  Paint fumes or strong smells.  Cold air, weather changes, and winds.  Crying or laughing hard.  Stress.  Certain medicines or drugs.  Foods, such as dried fruit, potato chips, and sparkling grape juice.  Infections or conditions (colds, flu).  Exercise.  Certain medical conditions or diseases.  Exercise or tiring activities. HOME CARE   Take medicine as told by your doctor.  Use a peak flow meter as told by your doctor. A peak flow meter is a tool that measures how well the lungs are working.  Record and keep track of the peak flow meter's readings.  Understand and use the asthma action plan. An asthma action plan is a written plan for taking care of your asthma and treating your attacks.  To help prevent asthma attacks:  Do not smoke. Stay away from secondhand smoke.  Change your heating and air conditioning filter often.  Limit your use of fireplaces and wood stoves.  Get rid of pests (such as roaches and mice) and their droppings.  Throw away plants if you see mold on them.  Clean your floors. Dust regularly. Use cleaning products that do not smell.  Have someone vacuum when you are not home. Use a vacuum cleaner with a HEPA filter if possible.  Replace carpet with wood, tile, or vinyl flooring. Carpet can trap animal skin flakes and dust.  Use allergy-proof pillows, mattress covers, and box spring covers.  Wash bed sheets and blankets every week in hot water and dry them in a dryer.  Use blankets that are made of polyester or cotton.  Clean bathrooms and kitchens with bleach.  If possible, have someone repaint the walls in these rooms with mold-resistant paint. Keep out of the rooms that are being cleaned and painted.  Wash hands often. GET HELP IF:  You have make a whistling sound when breaking (wheeze), have shortness of breath, or have a cough even if taking medicine to prevent attacks.  The colored mucus you cough up (sputum) is thicker than usual.  The colored mucus you cough up changes from clear or white to yellow, green, gray, or bloody.  You have problems from the medicine you are taking such as:  A rash.  Itching.  Swelling.  Trouble breathing.  You need reliever medicines more than 2-3 times a week.  Your peak flow measurement is still at 50-79% of your personal best after following the action plan for 1 hour.  You have a fever. GET HELP RIGHT AWAY IF:   You seem to be worse and are not responding to medicine during an asthma attack.  You are short of breath even at rest.  You get short of breath when doing very little activity.  You have trouble eating, drinking, or talking.  You have chest pain.  You have a fast heartbeat.  Your lips or fingernails start to turn blue.  You are light-headed, dizzy, or faint.  Your peak flow is less than 50% of your personal best.   This information is not intended to replace advice given to you by your health care provider. Make sure  you discuss any questions you have with your health care provider.   Use albuterol inhaler 2 puffs every 6 hours at least for the next 7 days then his eating. Take the prednisone as directed for the next 5 days. Make an appointment to follow-up with Dr. [name]. Return for any new or worse symptoms.   Document Released: 07/27/2007 Document Revised: 10/29/2014 Document Reviewed: 09/06/2012 Elsevier Interactive Patient Education [employer].
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
## 2015-01-11 ENCOUNTER — Encounter: Payer: Self-pay | Admitting: Family Medicine

## 2015-01-11 DIAGNOSIS — Z09 Encounter for follow-up examination after completed treatment for conditions other than malignant neoplasm: Secondary | ICD-10-CM | POA: Insufficient documentation

## 2015-01-11 NOTE — Assessment & Plan Note (Signed)
Deteriorated. Patient re-educated about  the importance of commitment to a  minimum of 150 minutes of exercise per week.  The importance of healthy food choices with portion control discussed. Encouraged to start a food diary, count calories and to consider  joining a support group. Sample diet sheets offered. Goals set by the patient for the next several months.   Weight /BMI 01/06/2015 12/24/2014 11/20/2014  WEIGHT 306 lb 305 lb 299 lb  HEIGHT 5\' 5"  5\' 5"  5\' 5"   BMI 50.92 kg/m2 50.75 kg/m2 49.76 kg/m2    Current exercise per week 0 minutes.
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
## 2015-01-11 NOTE — Assessment & Plan Note (Signed)
Not well managed, non compliant with maintainance therapy re educated re importance of this

## 2015-01-11 NOTE — Assessment & Plan Note (Signed)

## 2015-01-11 NOTE — Assessment & Plan Note (Signed)
Hospitalized at [facility] for acute on chronic respiratory failure from uncontrolled asthma and severe sleepp apnea, which he is not compliant ablout. Improved, but still needs to obtain maintenance medication and have close f/u both his sleep and lung specialistst

## 2015-01-12 ENCOUNTER — Ambulatory Visit: Payer: Medicaid Other | Attending: Neurology | Admitting: Sleep Medicine

## 2015-01-12 DIAGNOSIS — G4733 Obstructive sleep apnea (adult) (pediatric): Secondary | ICD-10-CM

## 2015-01-17 NOTE — Sleep Study (Signed)
  [place] A. Merlene Laughter, MD     [email or web address]             NOCTURNAL POLYSOMNOGRAPHY   LOCATION: [facility]   Patient Name: Shawn Ramsey, Shawn Ramsey Date: 01/12/2015 Gender: Male D.O.B: [birth year] Age (years): 43 Referring Provider: Not Available Height (inches): 65 Interpreting Physician: Phillips Odor MD, ABSM Weight (lbs): 306 RPSGT: Rosebud Poles BMI: 51 MRN: [id number] Neck Size: 22.50 CLINICAL INFORMATION The patient is referred for a split night study with BPAP. MEDICATIONS Medications taken by the patient : N/A  Medications administered by patient during sleep study : No sleep medicine administered. SLEEP STUDY TECHNIQUE As per the AASM Manual for the Scoring of Sleep and Associated Events v2.3 (April 2016) with a hypopnea requiring 4% desaturations. The channels recorded and monitored were frontal, central and occipital EEG, electrooculogram (EOG), submentalis EMG (chin), nasal and oral airflow, thoracic and abdominal wall motion, anterior tibialis EMG, snore microphone, electrocardiogram, and pulse oximetry. Bi-level positive airway pressure (BiPAP) was initiated when the patient met split night criteria and was titrated according to treat sleep-disordered breathing. RESPIRATORY PARAMETERS Diagnostic Total AHI (/hr): 28.5 RDI (/hr): 28.5 OA Index (/hr): 14.5 CA Index (/hr): 5.0 REM AHI (/hr): 6.9 NREM AHI (/hr): 46.4 Supine AHI (/hr): 47.6 Non-supine AHI (/hr): 25.11 Min O2 Sat (%): 73.00 Mean O2 (%): 92.60 Time below 88% (min): 19.4   Titration Optimal IPAP Pressure (cm): 24 Optimal EPAP Pressure (cm): 19 AHI at Optimal Pressure (/hr): 9.3 Min O2 at Optimal Pressure (%): 87.0 Sleep % at Optimal (%): 96 Supine % at Optimal (%): 0     SLEEP ARCHITECTURE The study was initiated at 10:13:58 PM and terminated at 4:11:48 AM. The total recorded time was 357.8 minutes. EEG confirmed total sleep time was 309.5 minutes yielding a sleep efficiency of  86.5%. Sleep onset after lights out was 29.3 minutes with a REM latency of 39.0 minutes. The patient spent 0.81% of the night in stage N1 sleep, 23.75% in stage N2 sleep, 30.21% in stage N3 and 45.23% in REM. Wake after sleep onset (WASO) was 19.0 minutes. The Arousal Index was 20.7/hour. LEG MOVEMENT DATA The total Periodic Limb Movements of Sleep (PLMS) were 0. The PLMS index was 0.00 . CARDIAC DATA The 2 lead EKG demonstrated sinus rhythm. The mean heart rate was 81.69 beats per minute. Other EKG findings include: Ventricular Tachycardia.   IMPRESSIONS - Moderate obstructive sleep apnea occurred during the diagnostic portion of the study (AHI = 28.5 /hour). An optimal BiPAP pressure was selected for this patient ( 24 /19 cm of water) - Mild central sleep apnea occurred during the diagnostic portion of the study (CAI = 5.0/hour).   Delano Metz, MD Diplomate, American Board of Sleep Medicine.

## 2015-01-26 ENCOUNTER — Encounter (HOSPITAL_COMMUNITY): Payer: Self-pay | Admitting: *Deleted

## 2015-01-26 ENCOUNTER — Emergency Department (HOSPITAL_COMMUNITY): Payer: Medicaid Other

## 2015-01-26 ENCOUNTER — Emergency Department (HOSPITAL_COMMUNITY)
Admission: EM | Admit: 2015-01-26 | Discharge: 2015-01-27 | Disposition: A | Discharge status: Home / Self Care | Payer: Medicaid Other | Attending: Emergency Medicine | Admitting: Emergency Medicine

## 2015-01-26 DIAGNOSIS — Z87828 Personal history of other (healed) physical injury and trauma: Secondary | ICD-10-CM | POA: Insufficient documentation

## 2015-01-26 DIAGNOSIS — F17210 Nicotine dependence, cigarettes, uncomplicated: Secondary | ICD-10-CM | POA: Insufficient documentation

## 2015-01-26 DIAGNOSIS — G4730 Sleep apnea, unspecified: Secondary | ICD-10-CM | POA: Diagnosis not present

## 2015-01-26 DIAGNOSIS — F329 Major depressive disorder, single episode, unspecified: Secondary | ICD-10-CM | POA: Diagnosis not present

## 2015-01-26 DIAGNOSIS — E119 Type 2 diabetes mellitus without complications: Secondary | ICD-10-CM | POA: Diagnosis not present

## 2015-01-26 DIAGNOSIS — R0600 Dyspnea, unspecified: Secondary | ICD-10-CM | POA: Insufficient documentation

## 2015-01-26 DIAGNOSIS — R609 Edema, unspecified: Secondary | ICD-10-CM

## 2015-01-26 DIAGNOSIS — F419 Anxiety disorder, unspecified: Secondary | ICD-10-CM | POA: Insufficient documentation

## 2015-01-26 DIAGNOSIS — Z79899 Other long term (current) drug therapy: Secondary | ICD-10-CM | POA: Diagnosis not present

## 2015-01-26 DIAGNOSIS — J45901 Unspecified asthma with (acute) exacerbation: Secondary | ICD-10-CM | POA: Diagnosis not present

## 2015-01-26 DIAGNOSIS — Z8701 Personal history of pneumonia (recurrent): Secondary | ICD-10-CM | POA: Diagnosis not present

## 2015-01-26 DIAGNOSIS — Z794 Long term (current) use of insulin: Secondary | ICD-10-CM | POA: Diagnosis not present

## 2015-01-26 DIAGNOSIS — R2243 Localized swelling, mass and lump, lower limb, bilateral: Secondary | ICD-10-CM | POA: Diagnosis present

## 2015-01-26 DIAGNOSIS — R600 Localized edema: Secondary | ICD-10-CM | POA: Diagnosis not present

## 2015-01-26 DIAGNOSIS — Z9981 Dependence on supplemental oxygen: Secondary | ICD-10-CM | POA: Insufficient documentation

## 2015-01-26 LAB — CBC
HCT: 37.9 % — ABNORMAL LOW (ref 39.0–52.0)
Hemoglobin: 12.2 g/dL — ABNORMAL LOW (ref 13.0–17.0)
MCH: 28.5 pg (ref 26.0–34.0)
MCHC: 32.2 g/dL (ref 30.0–36.0)
MCV: 88.6 fL (ref 78.0–100.0)
Platelets: 301 10*3/uL (ref 150–400)
RBC: 4.28 MIL/uL (ref 4.22–5.81)
RDW: 15.5 % (ref 11.5–15.5)
WBC: 5.3 10*3/uL (ref 4.0–10.5)

## 2015-01-26 LAB — BASIC METABOLIC PANEL
Anion gap: 10 (ref 5–15)
BUN: 14 mg/dL (ref 6–20)
CO2: 28 mmol/L (ref 22–32)
Calcium: 9.3 mg/dL (ref 8.9–10.3)
Chloride: 103 mmol/L (ref 101–111)
Creatinine, Ser: 0.88 mg/dL (ref 0.61–1.24)
GFR calc Af Amer: >60 mL/min (ref >60)
GFR calc non Af Amer: >60 mL/min (ref >60)
Glucose, Bld: 132 mg/dL — ABNORMAL HIGH (ref 65–99)
Potassium: 3.5 mmol/L (ref 3.5–5.1)
Sodium: 141 mmol/L (ref 135–145)

## 2015-01-26 LAB — I-STAT TROPONIN, ED: Troponin i, poc: 0 ng/mL (ref 0.00–0.08)

## 2015-01-26 LAB — BRAIN NATRIURETIC PEPTIDE: B Natriuretic Peptide: 9.5 pg/mL (ref 0.0–100.0)

## 2015-01-26 MED ORDER — FUROSEMIDE 20 MG PO TABS
20.0000 mg | ORAL_TABLET | Freq: Once | ORAL | Status: AC
  Administered 2015-01-26: 20 mg via ORAL
  Filled 2015-01-26: qty 1

## 2015-01-26 NOTE — ED Notes (Addendum)
Pt reports swelling to legs, abd and possibly face for several days. Having sob. ekg done at triage. spo2 94%. Swelling and redness noted to palms of hands.
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
## 2015-01-27 MED ORDER — FUROSEMIDE 20 MG PO TABS: 20.0000 mg | ORAL_TABLET | Freq: Every day | ORAL | Status: DC

## 2015-01-27 NOTE — Discharge Instructions (Signed)
Edema Edema is an abnormal buildup of fluids. It is more common in your legs and thighs. Painless swelling of the feet and ankles is more likely as a person ages. It also is common in looser skin, like around your eyes. HOME CARE   Keep the affected body part above the level of the heart while lying down.  Do not sit still or stand for a long time.  Do not put anything right under your knees when you lie down.  Do not wear tight clothes on your upper legs.  Exercise your legs to help the puffiness (swelling) go down.  Wear elastic bandages or support stockings as told by your doctor.  A low-salt diet may help lessen the puffiness.  Only take medicine as told by your doctor. GET HELP IF:  Treatment is not working.  You have heart, liver, or kidney disease and notice that your skin looks puffy or shiny.  You have puffiness in your legs that does not get better when you raise your legs.  You have sudden weight gain for no reason. GET HELP RIGHT AWAY IF:   You have shortness of breath or chest pain.  You cannot breathe when you lie down.  You have pain, redness, or warmth in the areas that are puffy.  You have heart, liver, or kidney disease and get edema all of a sudden.  You have a fever and your symptoms get worse all of a sudden. MAKE SURE YOU:   Understand these instructions.  Will watch your condition.  Will get help right away if you are not doing well or get worse.   This information is not intended to replace advice given to you by your health care provider. Make sure you discuss any questions you have with your health care provider.   Document Released: 07/27/2007 Document Revised: 02/12/2013 Document Reviewed: 11/30/2012 Elsevier Interactive Patient Education 2016 [employer]. Please try to limit your salt intake, even though you're taking a water pill.  It is very important that you drink plenty of water to keep your kidneys.  A [place] to help flush out the  impurities.  Please make an appointment with your primary care doctor to be seen early next week

## 2015-01-28 ENCOUNTER — Ambulatory Visit (INDEPENDENT_AMBULATORY_CARE_PROVIDER_SITE_OTHER): Payer: Medicaid Other | Admitting: Family Medicine

## 2015-01-28 ENCOUNTER — Encounter: Payer: Self-pay | Admitting: Family Medicine

## 2015-01-28 VITALS — BP 138/82 | HR 88 | Resp 16 | Ht 65.0 in | Wt 318.0 lb

## 2015-01-28 DIAGNOSIS — M79604 Pain in right leg: Secondary | ICD-10-CM

## 2015-01-28 DIAGNOSIS — J45991 Cough variant asthma: Secondary | ICD-10-CM

## 2015-01-28 DIAGNOSIS — R609 Edema, unspecified: Secondary | ICD-10-CM | POA: Insufficient documentation

## 2015-01-28 DIAGNOSIS — M79605 Pain in left leg: Secondary | ICD-10-CM

## 2015-01-28 DIAGNOSIS — E8881 Metabolic syndrome: Secondary | ICD-10-CM | POA: Diagnosis not present

## 2015-01-28 DIAGNOSIS — R601 Generalized edema: Secondary | ICD-10-CM

## 2015-01-28 DIAGNOSIS — E785 Hyperlipidemia, unspecified: Secondary | ICD-10-CM

## 2015-01-28 DIAGNOSIS — R7303 Prediabetes: Secondary | ICD-10-CM | POA: Diagnosis not present

## 2015-01-28 DIAGNOSIS — G4733 Obstructive sleep apnea (adult) (pediatric): Secondary | ICD-10-CM

## 2015-01-28 DIAGNOSIS — F29 Unspecified psychosis not due to a substance or known physiological condition: Secondary | ICD-10-CM

## 2015-01-28 MED ORDER — FUROSEMIDE 20 MG PO TABS: 20.0000 mg | ORAL_TABLET | Freq: Every day | ORAL | Status: DC

## 2015-01-28 MED ORDER — HYDROCODONE-ACETAMINOPHEN 5-325 MG PO TABS: 1.0000 | ORAL_TABLET | Freq: Every day | ORAL | Status: DC | PRN

## 2015-01-28 NOTE — Patient Instructions (Signed)
F//u in January as before  New is once daily lasix for swelling  VITAL you use your CPAP to prevent further heart damage, and swelling  You are being started on a pain contract , fior chronic leg pain as we discussed, your mother Shawn Ramsey will need to be responsible for your pain medications at home, so she wtoo will need to sign  Fasting lipid, chem 7 and HBA1C Jan 2 for Jan appt

## 2015-01-28 NOTE — Progress Notes (Signed)
Subjective:    Patient ID: Shawn Ramsey, male    DOB: 02/11/1981, 34 y.o.   MRN: [id number]  HPI Pt in for follow up from recent ED visit 2 days ago for ongoing leg swelling primarily of right lower extremity, no known trauma. Still not using CPAP C/o exertional fatigue   Review of Systems See HPI Denies recent fever or chills. Denies sinus pressure, nasal congestion, ear pain or sore throat. Denies chest congestion, productive cough or wheezing. Denies chest pains, palpitations and leg swelling Denies abdominal pain, nausea, vomiting,diarrhea or constipation.   Denies dysuria, frequency, hesitancy or incontinence.  Denies headaches, seizures, numbness, or tingling. Denies uncontrolled  depression, anxiety or insomnia. Denies skin break down or rash.        Objective:   Physical Exam  BP 138/82 mmHg  Pulse 88  Resp 16  Ht 5\' 5"  (1.651 m)  Wt 318 lb (144.244 kg)  BMI 52.92 kg/m2  SpO2 93%  Patient alert and oriented, morbidly obese and in mild cardiopulmonary distress.  HEENT: No facial asymmetry, EOMI,   oropharynx pink and moist.  Neck supple no JVD, no mass.  Chest: Clear to auscultation bilaterally.  CVS: S1, S2 no murmurs, no S3.Regular rate.  ABD: Soft non tender.   Ext: one plus pitting  Edema of RLE  MS: Decreased  ROM spine, shoulders, hips and knees.  Skin: Intact, no ulcerations or rash noted.  Psych: Good eye contact, normal affect. Memory intact not anxious or depressed appearing.  CNS: CN 2-12 intact, power,  normal throughout.no focal deficits noted.      Assessment & Plan:  Edema Lower extremity swelling , persistent, seen 2 days ago in the Ed, will continue lasix and he is referred to vascular for further evaluation as the swelling is unilateral. Appointment already established for this  OSA (obstructive sleep apnea) Untreated, non compliant, reports problems with mask, encouraged to urgently address this due to cardiopulmonary  compromise from untreated disease  COUGH VARIANT ASTHMA Controlled , also followed by pulmonary  Prediabetes Patient educated about the importance of limiting  Carbohydrate intake , the need to commit to daily physical activity for a minimum of 30 minutes , and to commit weight loss. The fact that changes in all these areas will reduce or eliminate all together the development of diabetes is stressed.   Diabetic Labs Latest Ref Rng 02/11/2015 01/26/2015 10/21/2014 09/08/2014 05/29/2014  HbA1c <5.7 % - - - 6.1(H) 6.1(H)  Chol 0 - 200 mg/dL - - - 181 198  HDL >=40 mg/dL - - - 31(L) 26(L)  Calc LDL 0 - 99 mg/dL - - - 117(H) 143(H)  Triglycerides <150 mg/dL - - - 166(H) 143  Creatinine 0.61 - 1.24 mg/dL 0.75 0.88 0.79 0.74 0.66   BP/Weight 02/11/2015 01/28/2015 01/27/2015 01/26/2015 01/12/2015 01/06/2015 123XX123  Systolic BP 0000000 0000000 Q000111Q - - Q000111Q 123XX123  Diastolic BP 90 82 99991111 - - 61 80  Wt. (Lbs) 316 318 - 313.4 305 306 305  BMI 52.59 52.92 - 52.15 50.75 50.92 50.75   No flowsheet data found.     Psychosis Stable , treated by psychiatry  Morbid obesity Deteriorated. Patient re-educated about  the importance of commitment to a  minimum of 150 minutes of exercise per week.  The importance of healthy food choices with portion control discussed. Encouraged to start a food diary, count calories and to consider  joining a support group. Sample diet sheets offered. Goals set by the  patient for the next several months.   Weight /BMI 02/11/2015 01/28/2015 01/26/2015  WEIGHT 316 lb 318 lb 313 lb 6.4 oz  HEIGHT 5\' 5"  5\' 5"  5\' 5"   BMI 52.59 kg/m2 52.92 kg/m2 52.15 kg/m2    Current exercise per week 30 minutes.

## 2015-01-30 ENCOUNTER — Telehealth: Payer: Self-pay | Admitting: Family Medicine

## 2015-01-30 NOTE — Telephone Encounter (Signed)
Patient is requesting a refill on furosemide (LASIX) 20 MG tablet called to [facility]

## 2015-01-30 NOTE — Telephone Encounter (Signed)
Called and left message notifying patient that medication was refilled at last visit.

## 2015-01-30 NOTE — Telephone Encounter (Signed)
Patient is needing a refilll on furosemide (LASIX) 20 MG tablet  Called to [facility]

## 2015-02-05 ENCOUNTER — Telehealth: Payer: Self-pay | Admitting: Family Medicine

## 2015-02-05 NOTE — Telephone Encounter (Signed)
Right foot is swelling real bad, patient is questioning cellulitis, please advise?

## 2015-02-05 NOTE — Telephone Encounter (Signed)
Called and spoke with mother.  She states that right foot is more swollen than left.   Went to [facility] on yesterday.  Sent for this note.   There is no warmth or redness to that foot.

## 2015-02-05 NOTE — Telephone Encounter (Signed)
Since just in the eD with negative exam ,. For unilateral leg swellling I recommend vascular eval pls refer to [facility] for eval of rigth leg swelling, I will sign  Pls also continuie to obtain recent ED note

## 2015-02-06 ENCOUNTER — Other Ambulatory Visit: Payer: Self-pay

## 2015-02-06 DIAGNOSIS — M7989 Other specified soft tissue disorders: Secondary | ICD-10-CM

## 2015-02-06 NOTE — Telephone Encounter (Signed)
Mother aware and referral entered

## 2015-02-11 ENCOUNTER — Emergency Department (HOSPITAL_COMMUNITY)
Admission: EM | Admit: 2015-02-11 | Discharge: 2015-02-11 | Disposition: A | Discharge status: Home / Self Care | Payer: Medicaid Other | Attending: Emergency Medicine | Admitting: Emergency Medicine

## 2015-02-11 ENCOUNTER — Emergency Department (HOSPITAL_COMMUNITY): Payer: Medicaid Other

## 2015-02-11 ENCOUNTER — Encounter (HOSPITAL_COMMUNITY): Payer: Self-pay | Admitting: Emergency Medicine

## 2015-02-11 DIAGNOSIS — Z8659 Personal history of other mental and behavioral disorders: Secondary | ICD-10-CM | POA: Diagnosis not present

## 2015-02-11 DIAGNOSIS — G4730 Sleep apnea, unspecified: Secondary | ICD-10-CM | POA: Diagnosis not present

## 2015-02-11 DIAGNOSIS — J45909 Unspecified asthma, uncomplicated: Secondary | ICD-10-CM | POA: Insufficient documentation

## 2015-02-11 DIAGNOSIS — R600 Localized edema: Secondary | ICD-10-CM | POA: Diagnosis not present

## 2015-02-11 DIAGNOSIS — H538 Other visual disturbances: Secondary | ICD-10-CM | POA: Diagnosis not present

## 2015-02-11 DIAGNOSIS — Z7984 Long term (current) use of oral hypoglycemic drugs: Secondary | ICD-10-CM | POA: Diagnosis not present

## 2015-02-11 DIAGNOSIS — R51 Headache: Secondary | ICD-10-CM | POA: Diagnosis not present

## 2015-02-11 DIAGNOSIS — R079 Chest pain, unspecified: Secondary | ICD-10-CM | POA: Insufficient documentation

## 2015-02-11 DIAGNOSIS — Z7951 Long term (current) use of inhaled steroids: Secondary | ICD-10-CM | POA: Insufficient documentation

## 2015-02-11 DIAGNOSIS — R000 Tachycardia, unspecified: Secondary | ICD-10-CM | POA: Insufficient documentation

## 2015-02-11 DIAGNOSIS — Z8701 Personal history of pneumonia (recurrent): Secondary | ICD-10-CM | POA: Insufficient documentation

## 2015-02-11 DIAGNOSIS — M7989 Other specified soft tissue disorders: Secondary | ICD-10-CM | POA: Diagnosis present

## 2015-02-11 DIAGNOSIS — Z87828 Personal history of other (healed) physical injury and trauma: Secondary | ICD-10-CM | POA: Diagnosis not present

## 2015-02-11 DIAGNOSIS — E119 Type 2 diabetes mellitus without complications: Secondary | ICD-10-CM | POA: Diagnosis not present

## 2015-02-11 DIAGNOSIS — R0602 Shortness of breath: Secondary | ICD-10-CM | POA: Diagnosis not present

## 2015-02-11 DIAGNOSIS — Z79899 Other long term (current) drug therapy: Secondary | ICD-10-CM | POA: Insufficient documentation

## 2015-02-11 DIAGNOSIS — F17210 Nicotine dependence, cigarettes, uncomplicated: Secondary | ICD-10-CM | POA: Insufficient documentation

## 2015-02-11 LAB — CBC WITH DIFFERENTIAL/PLATELET
Basophils Absolute: 0 10*3/uL (ref 0.0–0.1)
Basophils Relative: 0 %
Eosinophils Absolute: 0.1 10*3/uL (ref 0.0–0.7)
Eosinophils Relative: 2 %
HCT: 38.4 % — ABNORMAL LOW (ref 39.0–52.0)
Hemoglobin: 12.4 g/dL — ABNORMAL LOW (ref 13.0–17.0)
Lymphocytes Relative: 25 %
Lymphs Abs: 1.7 10*3/uL (ref 0.7–4.0)
MCH: 28.6 pg (ref 26.0–34.0)
MCHC: 32.3 g/dL (ref 30.0–36.0)
MCV: 88.7 fL (ref 78.0–100.0)
Monocytes Absolute: 0.5 10*3/uL (ref 0.1–1.0)
Monocytes Relative: 7 %
Neutro Abs: 4.5 10*3/uL (ref 1.7–7.7)
Neutrophils Relative %: 66 %
Platelets: 357 10*3/uL (ref 150–400)
RBC: 4.33 MIL/uL (ref 4.22–5.81)
RDW: 14.7 % (ref 11.5–15.5)
WBC: 6.9 10*3/uL (ref 4.0–10.5)

## 2015-02-11 LAB — COMPREHENSIVE METABOLIC PANEL
ALT: 24 U/L (ref 17–63)
AST: 19 U/L (ref 15–41)
Albumin: 4.1 g/dL (ref 3.5–5.0)
Alkaline Phosphatase: 60 U/L (ref 38–126)
Anion gap: 8 (ref 5–15)
BUN: 14 mg/dL (ref 6–20)
CO2: 29 mmol/L (ref 22–32)
Calcium: 8.9 mg/dL (ref 8.9–10.3)
Chloride: 103 mmol/L (ref 101–111)
Creatinine, Ser: 0.75 mg/dL (ref 0.61–1.24)
GFR calc Af Amer: >60 mL/min (ref >60)
GFR calc non Af Amer: >60 mL/min (ref >60)
Glucose, Bld: 96 mg/dL (ref 65–99)
Potassium: 3.7 mmol/L (ref 3.5–5.1)
Sodium: 140 mmol/L (ref 135–145)
Total Bilirubin: 0.3 mg/dL (ref 0.3–1.2)
Total Protein: 7.6 g/dL (ref 6.5–8.1)

## 2015-02-11 LAB — URINALYSIS, ROUTINE W REFLEX MICROSCOPIC
Bilirubin Urine: NEGATIVE
Glucose, UA: NEGATIVE mg/dL
Hgb urine dipstick: NEGATIVE
Ketones, ur: NEGATIVE mg/dL
Leukocytes, UA: NEGATIVE
Nitrite: NEGATIVE
Protein, ur: NEGATIVE mg/dL
Specific Gravity, Urine: 1.015 (ref 1.005–1.030)
pH: 7 (ref 5.0–8.0)

## 2015-02-11 MED ORDER — ACETAMINOPHEN 500 MG PO TABS
1000.0000 mg | ORAL_TABLET | Freq: Once | ORAL | Status: AC
  Administered 2015-02-11: 1000 mg via ORAL
  Filled 2015-02-11: qty 2
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
## 2015-02-11 NOTE — ED Provider Notes (Signed)
  Face-to-face evaluation   History: He presents for evaluation of swelling, which is ongoing. His PCP is treating it. He denies shortness of breath or chest pain.  Physical exam: Obese, alert, cooperative. Extremities with moderate peripheral edema.  Medical screening examination/treatment/procedure(s) were conducted as a shared visit with non-physician practitioner(s) and myself.  I personally evaluated the patient during the encounter  Daleen Bo, MD 02/11/15 [id number]

## 2015-02-11 NOTE — Discharge Instructions (Signed)
Call Dr. [name] tomorrow and discuss possibly increasing your Lasix to twice a day.
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
## 2015-02-12 ENCOUNTER — Telehealth: Payer: Self-pay

## 2015-02-12 MED ORDER — FUROSEMIDE 20 MG PO TABS: 20.0000 mg | ORAL_TABLET | Freq: Two times a day (BID) | ORAL | Status: DC

## 2015-02-12 MED ORDER — POTASSIUM CHLORIDE CRYS ER 20 MEQ PO TBCR: 20.0000 meq | EXTENDED_RELEASE_TABLET | Freq: Every day | ORAL | Status: DC

## 2015-02-12 NOTE — Addendum Note (Signed)
Addended by: Denman George B on: 02/12/2015 10:46 AM   Modules accepted: Orders

## 2015-02-12 NOTE — Telephone Encounter (Signed)
Advise inc lasix to 20 mg twice daily , pls send in new script, he needs to start [potassium 20 meq one daily with this also, pls send in No opinion on the vinegar, unaware that it is of any use

## 2015-02-12 NOTE — Telephone Encounter (Signed)
Patient/mother aware and medications sent to pharmacy.  Mother aware that [facility] is processing referral.

## 2015-02-20 ENCOUNTER — Other Ambulatory Visit: Payer: Self-pay

## 2015-02-20 MED ORDER — HYDROCODONE-ACETAMINOPHEN 5-325 MG PO TABS: 1.0000 | ORAL_TABLET | Freq: Every day | ORAL | Status: DC | PRN

## 2015-02-23 NOTE — Assessment & Plan Note (Signed)
Controlled , also followed by pulmonary

## 2015-02-23 NOTE — Assessment & Plan Note (Signed)
Lower extremity swelling , persistent, seen 2 days ago in the Ed, will continue lasix and he is referred to vascular for further evaluation as the swelling is unilateral. Appointment already established for this

## 2015-02-23 NOTE — Assessment & Plan Note (Signed)
Deteriorated. Patient re-educated about  the importance of commitment to a  minimum of 150 minutes of exercise per week.  The importance of healthy food choices with portion control discussed. Encouraged to start a food diary, count calories and to consider  joining a support group. Sample diet sheets offered. Goals set by the patient for the next several months.   Weight /BMI 02/11/2015 01/28/2015 01/26/2015  WEIGHT 316 lb 318 lb 313 lb 6.4 oz  HEIGHT 5\' 5"  5\' 5"  5\' 5"   BMI 52.59 kg/m2 52.92 kg/m2 52.15 kg/m2    Current exercise per week 30 minutes.

## 2015-02-23 NOTE — Assessment & Plan Note (Signed)
Stable, treated by psychiatry 

## 2015-02-23 NOTE — Assessment & Plan Note (Signed)
Untreated, non compliant, reports problems with mask, encouraged to urgently address this due to cardiopulmonary compromise from untreated disease

## 2015-02-23 NOTE — Assessment & Plan Note (Signed)
Patient educated about the importance of limiting  Carbohydrate intake , the need to commit to daily physical activity for a minimum of 30 minutes , and to commit weight loss. The fact that changes in all these areas will reduce or eliminate all together the development of diabetes is stressed.   Diabetic Labs Latest Ref Rng 02/11/2015 01/26/2015 10/21/2014 09/08/2014 05/29/2014  HbA1c <5.7 % - - - 6.1(H) 6.1(H)  Chol 0 - 200 mg/dL - - - 181 198  HDL >=40 mg/dL - - - 31(L) 26(L)  Calc LDL 0 - 99 mg/dL - - - 117(H) 143(H)  Triglycerides <150 mg/dL - - - 166(H) 143  Creatinine 0.61 - 1.24 mg/dL 0.75 0.88 0.79 0.74 0.66   BP/Weight 02/11/2015 01/28/2015 01/27/2015 01/26/2015 01/12/2015 01/06/2015 123XX123  Systolic BP 0000000 0000000 Q000111Q - - Q000111Q 123XX123  Diastolic BP 90 82 99991111 - - 61 80  Wt. (Lbs) 316 318 - 313.4 305 306 305  BMI 52.59 52.92 - 52.15 50.75 50.92 50.75   No flowsheet data found.

## 2015-02-26 ENCOUNTER — Ambulatory Visit: Payer: Medicaid Other | Admitting: Family Medicine

## 2015-03-09 ENCOUNTER — Other Ambulatory Visit: Payer: Self-pay

## 2015-03-09 DIAGNOSIS — M7989 Other specified soft tissue disorders: Secondary | ICD-10-CM

## 2015-03-12 ENCOUNTER — Ambulatory Visit (INDEPENDENT_AMBULATORY_CARE_PROVIDER_SITE_OTHER): Payer: Self-pay | Admitting: Otolaryngology

## 2015-03-16 ENCOUNTER — Other Ambulatory Visit: Payer: Self-pay

## 2015-03-16 ENCOUNTER — Telehealth: Payer: Self-pay

## 2015-03-16 MED ORDER — FUROSEMIDE 20 MG PO TABS: 40.0000 mg | ORAL_TABLET | Freq: Two times a day (BID) | ORAL | Status: DC

## 2015-03-16 MED ORDER — POTASSIUM CHLORIDE CRYS ER 20 MEQ PO TBCR: 20.0000 meq | EXTENDED_RELEASE_TABLET | Freq: Three times a day (TID) | ORAL | Status: DC

## 2015-03-16 NOTE — Telephone Encounter (Signed)
Advise increase lasix to 20 mg   TWO twice daily, and potassium 20 meq one three times daily, pls send in scripts. Also no sodas and  Reduce salt intake  OV later this week for eval also please  REMIND her to get CPAP machine and he needs to  use

## 2015-03-16 NOTE — Telephone Encounter (Signed)
Spoke with mother and she is aware of advice.   States that patient received cpap on Friday and has been complying with use.     He is unable to come in for an appt.  Something about his girlfriend having a baby next week?    Medications sent to pharmacy

## 2015-03-17 NOTE — Telephone Encounter (Signed)
Noted, thanks!

## 2015-04-07 ENCOUNTER — Encounter (HOSPITAL_COMMUNITY): Payer: Self-pay | Admitting: Emergency Medicine

## 2015-04-07 ENCOUNTER — Emergency Department (HOSPITAL_COMMUNITY): Payer: Medicaid Other

## 2015-04-07 ENCOUNTER — Emergency Department (HOSPITAL_COMMUNITY)
Admission: EM | Admit: 2015-04-07 | Discharge: 2015-04-07 | Disposition: A | Discharge status: Home / Self Care | Payer: Medicaid Other | Attending: Emergency Medicine | Admitting: Emergency Medicine

## 2015-04-07 DIAGNOSIS — M79661 Pain in right lower leg: Secondary | ICD-10-CM | POA: Diagnosis not present

## 2015-04-07 DIAGNOSIS — Z9981 Dependence on supplemental oxygen: Secondary | ICD-10-CM | POA: Insufficient documentation

## 2015-04-07 DIAGNOSIS — Z7951 Long term (current) use of inhaled steroids: Secondary | ICD-10-CM | POA: Diagnosis not present

## 2015-04-07 DIAGNOSIS — Z8659 Personal history of other mental and behavioral disorders: Secondary | ICD-10-CM | POA: Insufficient documentation

## 2015-04-07 DIAGNOSIS — R0602 Shortness of breath: Secondary | ICD-10-CM | POA: Diagnosis present

## 2015-04-07 DIAGNOSIS — R600 Localized edema: Secondary | ICD-10-CM | POA: Diagnosis not present

## 2015-04-07 DIAGNOSIS — E119 Type 2 diabetes mellitus without complications: Secondary | ICD-10-CM | POA: Insufficient documentation

## 2015-04-07 DIAGNOSIS — G4730 Sleep apnea, unspecified: Secondary | ICD-10-CM | POA: Diagnosis not present

## 2015-04-07 DIAGNOSIS — M79662 Pain in left lower leg: Secondary | ICD-10-CM | POA: Insufficient documentation

## 2015-04-07 DIAGNOSIS — Z7984 Long term (current) use of oral hypoglycemic drugs: Secondary | ICD-10-CM | POA: Insufficient documentation

## 2015-04-07 DIAGNOSIS — Z79899 Other long term (current) drug therapy: Secondary | ICD-10-CM | POA: Insufficient documentation

## 2015-04-07 DIAGNOSIS — J45909 Unspecified asthma, uncomplicated: Secondary | ICD-10-CM

## 2015-04-07 DIAGNOSIS — Z87828 Personal history of other (healed) physical injury and trauma: Secondary | ICD-10-CM | POA: Diagnosis not present

## 2015-04-07 DIAGNOSIS — Z8701 Personal history of pneumonia (recurrent): Secondary | ICD-10-CM | POA: Diagnosis not present

## 2015-04-07 DIAGNOSIS — J45901 Unspecified asthma with (acute) exacerbation: Secondary | ICD-10-CM | POA: Diagnosis not present

## 2015-04-07 DIAGNOSIS — F17210 Nicotine dependence, cigarettes, uncomplicated: Secondary | ICD-10-CM | POA: Insufficient documentation

## 2015-04-07 DIAGNOSIS — R609 Edema, unspecified: Secondary | ICD-10-CM

## 2015-04-07 LAB — CBC WITH DIFFERENTIAL/PLATELET
Basophils Absolute: 0 10*3/uL (ref 0.0–0.1)
Basophils Relative: 0 %
Eosinophils Absolute: 0.1 10*3/uL (ref 0.0–0.7)
Eosinophils Relative: 2 %
HCT: 34.4 % — ABNORMAL LOW (ref 39.0–52.0)
Hemoglobin: 11.1 g/dL — ABNORMAL LOW (ref 13.0–17.0)
Lymphocytes Relative: 25 %
Lymphs Abs: 1.6 10*3/uL (ref 0.7–4.0)
MCH: 28.8 pg (ref 26.0–34.0)
MCHC: 32.3 g/dL (ref 30.0–36.0)
MCV: 89.1 fL (ref 78.0–100.0)
Monocytes Absolute: 0.4 10*3/uL (ref 0.1–1.0)
Monocytes Relative: 7 %
Neutro Abs: 4.3 10*3/uL (ref 1.7–7.7)
Neutrophils Relative %: 67 %
Platelets: 322 10*3/uL (ref 150–400)
RBC: 3.86 MIL/uL — ABNORMAL LOW (ref 4.22–5.81)
RDW: 15 % (ref 11.5–15.5)
WBC: 6.7 10*3/uL (ref 4.0–10.5)

## 2015-04-07 LAB — BASIC METABOLIC PANEL
Anion gap: 8 (ref 5–15)
BUN: 14 mg/dL (ref 6–20)
CO2: 29 mmol/L (ref 22–32)
Calcium: 8.8 mg/dL — ABNORMAL LOW (ref 8.9–10.3)
Chloride: 104 mmol/L (ref 101–111)
Creatinine, Ser: 0.77 mg/dL (ref 0.61–1.24)
GFR calc Af Amer: >60 mL/min (ref >60)
GFR calc non Af Amer: >60 mL/min (ref >60)
Glucose, Bld: 155 mg/dL — ABNORMAL HIGH (ref 65–99)
Potassium: 3.8 mmol/L (ref 3.5–5.1)
Sodium: 141 mmol/L (ref 135–145)

## 2015-04-07 LAB — BRAIN NATRIURETIC PEPTIDE: B Natriuretic Peptide: 16 pg/mL (ref 0.0–100.0)

## 2015-04-07 MED ORDER — IPRATROPIUM-ALBUTEROL 0.5-2.5 (3) MG/3ML IN SOLN
3.0000 mL | Freq: Once | RESPIRATORY_TRACT | Status: AC
  Administered 2015-04-07: 3 mL via RESPIRATORY_TRACT
  Filled 2015-04-07: qty 3

## 2015-04-07 MED ORDER — FUROSEMIDE 10 MG/ML IJ SOLN
40.0000 mg | Freq: Once | INTRAMUSCULAR | Status: AC
  Administered 2015-04-07: 40 mg via INTRAVENOUS
  Filled 2015-04-07: qty 4

## 2015-04-07 MED ORDER — FUROSEMIDE 40 MG PO TABS: 40.0000 mg | ORAL_TABLET | Freq: Two times a day (BID) | ORAL | Status: DC

## 2015-04-07 NOTE — ED Notes (Signed)
Patient given discharge instruction, verbalized understand. IV removed, band aid applied. Patient ambulatory out of the department.  

## 2015-04-07 NOTE — ED Notes (Signed)
Ambulated Pt spo2 was 94 and his pulse was 100

## 2015-04-07 NOTE — ED Notes (Signed)
MD at the bedside, wife in the room

## 2015-04-07 NOTE — ED Notes (Signed)
PT c/o SOB on exertion with worsening in peripheral edema. PT states he take lasix 20 mg twice a day. PT also states productive thick clear sputum cough.
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
## 2015-04-10 ENCOUNTER — Encounter: Payer: Self-pay | Admitting: Vascular Surgery

## 2015-04-17 ENCOUNTER — Ambulatory Visit (HOSPITAL_COMMUNITY)
Admission: RE | Admit: 2015-04-17 | Discharge: 2015-04-17 | Disposition: A | Discharge status: Home / Self Care | Payer: Medicaid Other | Source: Ambulatory Visit | Attending: Vascular Surgery | Admitting: Vascular Surgery

## 2015-04-17 ENCOUNTER — Other Ambulatory Visit: Payer: Self-pay

## 2015-04-17 ENCOUNTER — Encounter: Payer: Self-pay | Admitting: Vascular Surgery

## 2015-04-17 ENCOUNTER — Ambulatory Visit (INDEPENDENT_AMBULATORY_CARE_PROVIDER_SITE_OTHER): Payer: Medicaid Other | Admitting: Vascular Surgery

## 2015-04-17 DIAGNOSIS — I872 Venous insufficiency (chronic) (peripheral): Secondary | ICD-10-CM | POA: Diagnosis not present

## 2015-04-17 DIAGNOSIS — M7989 Other specified soft tissue disorders: Secondary | ICD-10-CM

## 2015-04-17 DIAGNOSIS — I8391 Asymptomatic varicose veins of right lower extremity: Secondary | ICD-10-CM | POA: Diagnosis not present

## 2015-04-17 DIAGNOSIS — E119 Type 2 diabetes mellitus without complications: Secondary | ICD-10-CM | POA: Insufficient documentation

## 2015-04-17 MED ORDER — HYDROCODONE-ACETAMINOPHEN 5-325 MG PO TABS: 1.0000 | ORAL_TABLET | Freq: Every day | ORAL | Status: DC | PRN
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
## 2015-04-27 ENCOUNTER — Telehealth: Payer: Self-pay | Admitting: Family Medicine

## 2015-04-27 NOTE — Telephone Encounter (Signed)
Patient states that he is having a lot of swelling in his right leg and its hard to the touch. Seen vascular Dr 2/24 and has appt here next Tuesday. Mother thinks he may be having pain in that leg also but was unsure. Wants to know if he needs an [place] or if you want to see him sooner than next week.

## 2015-04-27 NOTE — Telephone Encounter (Signed)
Patient scheduled for sooner appt tomorrow at [facility]

## 2015-04-27 NOTE — Telephone Encounter (Signed)
Patients mother "[name]"  states that Cordneys right leg is swollen, discolored (black) , please advise?

## 2015-04-27 NOTE — Telephone Encounter (Signed)
Pl sched sooner appt, vascular Doc had recommendation for additional testing

## 2015-04-28 ENCOUNTER — Ambulatory Visit (INDEPENDENT_AMBULATORY_CARE_PROVIDER_SITE_OTHER): Payer: Medicaid Other | Admitting: Family Medicine

## 2015-04-28 ENCOUNTER — Encounter: Payer: Self-pay | Admitting: Family Medicine

## 2015-04-28 VITALS — BP 134/86 | HR 98 | Resp 16 | Ht 64.0 in | Wt 319.0 lb

## 2015-04-28 DIAGNOSIS — J45991 Cough variant asthma: Secondary | ICD-10-CM

## 2015-04-28 DIAGNOSIS — I872 Venous insufficiency (chronic) (peripheral): Secondary | ICD-10-CM

## 2015-04-28 DIAGNOSIS — E8881 Metabolic syndrome: Secondary | ICD-10-CM

## 2015-04-28 DIAGNOSIS — R600 Localized edema: Secondary | ICD-10-CM

## 2015-04-28 DIAGNOSIS — R601 Generalized edema: Secondary | ICD-10-CM

## 2015-04-28 DIAGNOSIS — F17200 Nicotine dependence, unspecified, uncomplicated: Secondary | ICD-10-CM

## 2015-04-28 DIAGNOSIS — R7303 Prediabetes: Secondary | ICD-10-CM

## 2015-04-28 DIAGNOSIS — E785 Hyperlipidemia, unspecified: Secondary | ICD-10-CM

## 2015-04-28 DIAGNOSIS — G4733 Obstructive sleep apnea (adult) (pediatric): Secondary | ICD-10-CM

## 2015-04-28 LAB — COMPLETE METABOLIC PANEL WITH GFR
ALT: 23 U/L (ref 9–46)
AST: 17 U/L (ref 10–40)
Albumin: 4.4 g/dL (ref 3.6–5.1)
Alkaline Phosphatase: 72 U/L (ref 40–115)
BUN: 13 mg/dL (ref 7–25)
CO2: 32 mmol/L — ABNORMAL HIGH (ref 20–31)
Calcium: 9.2 mg/dL (ref 8.6–10.3)
Chloride: 96 mmol/L — ABNORMAL LOW (ref 98–110)
Creat: 0.93 mg/dL (ref 0.60–1.35)
GFR, Est African American: >89 mL/min (ref >=60)
GFR, Est Non African American: >89 mL/min (ref >=60)
Glucose, Bld: 106 mg/dL — ABNORMAL HIGH (ref 65–99)
Potassium: 3.9 mmol/L (ref 3.5–5.3)
Sodium: 138 mmol/L (ref 135–146)
Total Bilirubin: 0.2 mg/dL (ref 0.2–1.2)
Total Protein: 7.7 g/dL (ref 6.1–8.1)

## 2015-04-28 LAB — LIPID PANEL
Cholesterol: 205 mg/dL — ABNORMAL HIGH (ref 125–200)
HDL: 31 mg/dL — ABNORMAL LOW (ref >=40)
LDL Cholesterol: 133 mg/dL — ABNORMAL HIGH (ref <130)
Total CHOL/HDL Ratio: 6.6 Ratio — ABNORMAL HIGH (ref <=5.0)
Triglycerides: 203 mg/dL — ABNORMAL HIGH (ref <150)
VLDL: 41 mg/dL — ABNORMAL HIGH (ref <30)

## 2015-04-28 LAB — CBC WITH DIFFERENTIAL/PLATELET
Basophils Absolute: 0.1 10*3/uL (ref 0.0–0.1)
Basophils Relative: 1 % (ref 0–1)
Eosinophils Absolute: 0.1 10*3/uL (ref 0.0–0.7)
Eosinophils Relative: 2 % (ref 0–5)
HCT: 39.3 % (ref 39.0–52.0)
Hemoglobin: 12.7 g/dL — ABNORMAL LOW (ref 13.0–17.0)
Lymphocytes Relative: 36 % (ref 12–46)
Lymphs Abs: 2.3 10*3/uL (ref 0.7–4.0)
MCH: 27.4 pg (ref 26.0–34.0)
MCHC: 32.3 g/dL (ref 30.0–36.0)
MCV: 84.9 fL (ref 78.0–100.0)
MPV: 9 fL (ref 8.6–12.4)
Monocytes Absolute: 0.5 10*3/uL (ref 0.1–1.0)
Monocytes Relative: 7 % (ref 3–12)
Neutro Abs: 3.5 10*3/uL (ref 1.7–7.7)
Neutrophils Relative %: 54 % (ref 43–77)
Platelets: 402 10*3/uL — ABNORMAL HIGH (ref 150–400)
RBC: 4.63 MIL/uL (ref 4.22–5.81)
RDW: 15.2 % (ref 11.5–15.5)
WBC: 6.5 10*3/uL (ref 4.0–10.5)

## 2015-04-28 LAB — CK: Total CK: 302 U/L — ABNORMAL HIGH (ref 7–232)

## 2015-04-28 LAB — TSH: TSH: 3.04 mIU/L (ref 0.40–4.50)

## 2015-04-28 NOTE — Progress Notes (Signed)
Subjective:    Patient ID: Shawn Ramsey, male    DOB: [birth year], 35 y.o.   MRN: [id number]  HPI   Shawn Ramsey     MRN: [id number]      DOB: Jul 31, 1980   HPI Shawn Ramsey is here for follow up and re-evaluation of chronic medical conditions, medication management and review of any available recent lab and radiology data.  Preventive health is updated, specifically  Cancer screening and Immunization.   Questions or concerns regarding consultations or procedures which the PT has had in the interim are  addressed. The PT denies any adverse reactions to current medications since the last visit.  C/o persistent severe bilateral leg edema, right worse than left with tender area Not using CPAP, has no equipment C/o chronic and uncontrolled back and leg pain No regular exercise , essentially sedentary lifestle  ROS Denies recent fever or chills. Denies sinus pressure, nasal congestion, ear pain or sore throat. Denies chest congestion, productive cough or wheezing. Denies chest pains, palpitations Denies abdominal pain, nausea, vomiting,diarrhea or constipation.   Denies dysuria, frequency, hesitancy or incontinence. C/o back  pain,  and limitation in mobility. Denies headaches, seizures, numbness, or tingling. Denies depression, anxiety or insomnia. Denies skin break down or rash.   PE  BP 134/86 mmHg  Pulse 98  Resp 16  Ht 5\' 4"  (1.626 m)  Wt 319 lb (144.697 kg)  BMI 54.73 kg/m2  SpO2 96%  Patient alert and oriented and in no cardiopulmonary distress.  HEENT: No facial asymmetry, EOMI,   oropharynx pink and moist.  Neck supple no JVD, no mass.  Chest: Clear to auscultation bilaterally.Decreased though adequate air entry  CVS: S1, S2 no murmurs, no S3.Regular rate.  ABD: Soft non tender.   Ext: bilateral 2 plus edema  [id number]  ROM spine, shoulders, hips and knees.  Skin: Intact, no ulcerations or rash noted.  Psych: Good eye contact, flat affect. Memory  impairedct not anxious or depressed appearing.  CNS: CN 2-12 intact, power,  normal throughout.no focal deficits noted.   Assessment & Plan   OSA (obstructive sleep apnea) Reports still having no equipment with clinical evidence of  cardiopulmonary  Compromise, I explained again to pt and his Mother how important it is to have his illness treated. He also sees pulmonary locally regularly, and I am directly soliciting his help in getting pt's equipment as well as specifically addressing his oxygen needs I did speak directly with Dr Luan Pulling during the visit and he is aware of situation and had already referred pt for retesting for CPAP equipment in [place] currently stable  Chronic venous insufficiency Chronic leg edema not attributed to significant vascular pathology, further testing suggested, will need to follow through if treatment of his sleep apnea does not lead to improvement also will review today's labs Has had eval by vascular surgeon  Edema Lasix and potassium to be continued, leg elevation a;lso recommended and reduced sodium intake  Morbid obesity Deteriorated. Patient re-educated about  the importance of commitment to a  minimum of 150 minutes of exercise per week.  The importance of healthy food choices with portion control discussed. Encouraged to start a food diary, count calories and to consider  joining a support group. Sample diet sheets offered. Goals set by the patient for the next several months.   Weight /BMI 04/28/2015 04/17/2015 01/26/2015  WEIGHT 319 lb 313 lb 12.8 oz 313 lb 6.4 oz  HEIGHT 5\' 4"   5\' 4"  5\' 5"   BMI 54.73 kg/m2 53.84 kg/m2 52.15 kg/m2  Some encounter information is confidential and restricted. Go to Review Flowsheets activity to see all data.    Current exercise per week zero, sedentary.   Prediabetes Patient educated about the importance of limiting  Carbohydrate intake , the need to commit to daily physical  activity for a minimum of 30 minutes , and to commit weight loss. The fact that changes in all these areas will reduce or eliminate all together the development of diabetes is stressed.   Diabetic Labs Latest Ref Rng 04/28/2015 04/07/2015 02/11/2015 01/26/2015 10/21/2014  HbA1c <5.7 % 6.3(H) - - - -  Chol 125 - 200 mg/dL 205(H) - - - -  HDL >=40 mg/dL 31(L) - - - -  Calc LDL <130 mg/dL 133(H) - - - -  Triglycerides <150 mg/dL 203(H) - - - -  Creatinine 0.60 - 1.35 mg/dL 0.93 0.77 0.75 0.88 0.79   BP/Weight 04/28/2015 04/17/2015 01/27/2015 01/26/2015 01/12/2015 [id number]  Systolic BP [id number] Q000111Q - - 0000000 [id number]  Diastolic BP 86 83 [id number] - - 78 84  Wt. (Lbs) 319 313.8 - 313.4 305 299 -  BMI 54.73 53.84 - 52.15 50.75 49.76 -  Some encounter information is confidential and restricted. Go to Review Flowsheets activity to see all data.   No flowsheet data found.      NICOTINE ADDICTION Patient counseled for approximately 5 minutes regarding the health risks of ongoing nicotine use, specifically all types of cancer, heart disease, stroke and respiratory failure. The options available for help with cessation ,the behavioral changes to assist the process, and the option to either gradully reduce usage  Or abruptly stop.is also discussed. Pt is also encouraged to set specific goals in number of cigarettes used daily, as well as to set a quit date.  Number of cigarettes/cigars currently smoking weekly approx 3 to 5   Dyslipidemia Deteriorated with high CV risk , needs statin Hyperlipidemia:Low fat diet discussed and encouraged.   Lipid Panel  Lab Results  Component Value Date   CHOL 205* 04/28/2015   HDL 31* 04/28/2015   LDLCALC 133* 04/28/2015   TRIG 203* 04/28/2015   CHOLHDL 6.6* [id number]        Metabolic syndrome X The increased risk of cardiovascular disease associated with this diagnosis, and the need to consistently work on lifestyle to change this is discussed. Following   a  heart healthy diet ,commitment to 30 minutes of exercise at least 5 days per week, as well as control of blood sugar and cholesterol , and achieving a healthy weight are all the areas to be addressed .        Review of Systems     Objective:   Physical Exam        Assessment & Plan:

## 2015-04-28 NOTE — Patient Instructions (Signed)
Cancel 3/14 visit, sched 3 month follow yup call if you need me sooner  I am speaking directly with Dr Luan Pulling and requesting his help with management of your sleep apnea as well as determining and managing oxygen needs  Labs today please . I will be in touch as far as additional testing or evaluation at a tertiary center when things are clearer

## 2015-04-29 LAB — HEMOGLOBIN A1C
Hgb A1c MFr Bld: 6.3 % — ABNORMAL HIGH (ref <5.7)
Mean Plasma Glucose: 134 mg/dL — ABNORMAL HIGH (ref <117)

## 2015-04-29 LAB — SEDIMENTATION RATE: Sed Rate: 27 mm/hr — ABNORMAL HIGH (ref 0–15)

## 2015-05-05 ENCOUNTER — Ambulatory Visit: Payer: Medicaid Other | Admitting: Family Medicine

## 2015-05-28 ENCOUNTER — Telehealth: Payer: Self-pay | Admitting: Family Medicine

## 2015-05-28 NOTE — Telephone Encounter (Signed)
Shawn Ramsey is calling asking to speak to [facility] regarding Chief Technology Officer Form

## 2015-05-28 NOTE — Telephone Encounter (Signed)
Needs another copy of handicap placard.

## 2015-06-08 ENCOUNTER — Encounter: Payer: Self-pay | Admitting: Family Medicine

## 2015-06-08 NOTE — Assessment & Plan Note (Signed)
Reports still having no equipment with clinical evidence of  cardiopulmonary  Compromise, I explained again to pt and his Mother how important it is to have his illness treated. He also sees pulmonary locally regularly, and I am directly soliciting his help in getting pt's equipment as well as specifically addressing his oxygen needs I did speak directly with Dr Luan Pulling during the visit and he is aware of situation and had already referred pt for retesting for CPAP equipment in [facility]

## 2015-06-08 NOTE — Assessment & Plan Note (Signed)
Deteriorated with high CV risk , needs statin Hyperlipidemia:Low fat diet discussed and encouraged.   Lipid Panel  Lab Results  Component Value Date   CHOL 205* 04/28/2015   HDL 31* 04/28/2015   LDLCALC 133* 04/28/2015   TRIG 203* 04/28/2015   CHOLHDL 6.6* 04/28/2015

## 2015-06-08 NOTE — Assessment & Plan Note (Signed)
Deteriorated. Patient re-educated about  the importance of commitment to a  minimum of 150 minutes of exercise per week.  The importance of healthy food choices with portion control discussed. Encouraged to start a food diary, count calories and to consider  joining a support group. Sample diet sheets offered. Goals set by the patient for the next several months.   Weight /BMI 04/28/2015 04/17/2015 01/26/2015  WEIGHT 319 lb 313 lb 12.8 oz 313 lb 6.4 oz  HEIGHT 5\' 4"  5\' 4"  5\' 5"   BMI 54.73 kg/m2 53.84 kg/m2 52.15 kg/m2  Some encounter information is confidential and restricted. Go to Review Flowsheets activity to see all data.    Current exercise per week zero, sedentary.

## 2015-06-08 NOTE — Assessment & Plan Note (Signed)
The increased risk of cardiovascular disease associated with this diagnosis, and the need to consistently work on lifestyle to change this is discussed. Following  a  heart healthy diet ,commitment to 30 minutes of exercise at least 5 days per week, as well as control of blood sugar and cholesterol , and achieving a healthy weight are all the areas to be addressed .  

## 2015-06-08 NOTE — Assessment & Plan Note (Signed)
Lasix and potassium to be continued, leg elevation a;lso recommended and reduced sodium intake

## 2015-06-08 NOTE — Assessment & Plan Note (Signed)

## 2015-06-08 NOTE — Assessment & Plan Note (Signed)
Patient educated about the importance of limiting  Carbohydrate intake , the need to commit to daily physical activity for a minimum of 30 minutes , and to commit weight loss. The fact that changes in all these areas will reduce or eliminate all together the development of diabetes is stressed.   Diabetic Labs Latest Ref Rng 04/28/2015 04/07/2015 02/11/2015 01/26/2015 10/21/2014  HbA1c <5.7 % 6.3(H) - - - -  Chol 125 - 200 mg/dL 205(H) - - - -  HDL >=40 mg/dL 31(L) - - - -  Calc LDL <130 mg/dL 133(H) - - - -  Triglycerides <150 mg/dL 203(H) - - - -  Creatinine 0.60 - 1.35 mg/dL 0.93 0.77 0.75 0.88 0.79   BP/Weight 04/28/2015 04/17/2015 01/27/2015 01/26/2015 01/12/2015 [id number]  Systolic BP Q000111Q [id number] Q000111Q - - 0000000 [id number]  Diastolic BP 86 83 [id number] - - 78 84  Wt. (Lbs) 319 313.8 - 313.4 305 299 -  BMI 54.73 53.84 - 52.15 50.75 49.76 -  Some encounter information is confidential and restricted. Go to Review Flowsheets activity to see all data.   No flowsheet data found.

## 2015-06-08 NOTE — Assessment & Plan Note (Signed)
--   currently stable  ?

## 2015-06-08 NOTE — Assessment & Plan Note (Addendum)
Chronic leg edema not attributed to significant vascular pathology, further testing suggested, will need to follow through if treatment of his sleep apnea does not lead to improvement also will review today's labs Has had eval by vascular surgeon

## 2015-06-09 ENCOUNTER — Other Ambulatory Visit: Payer: Self-pay

## 2015-06-09 MED ORDER — HYDROCODONE-ACETAMINOPHEN 5-325 MG PO TABS: 1.0000 | ORAL_TABLET | Freq: Every day | ORAL | Status: DC | PRN

## 2015-06-14 ENCOUNTER — Emergency Department (HOSPITAL_COMMUNITY)
Admission: EM | Admit: 2015-06-14 | Discharge: 2015-06-14 | Disposition: A | Discharge status: Home / Self Care | Payer: Medicaid Other | Attending: Emergency Medicine | Admitting: Emergency Medicine

## 2015-06-14 ENCOUNTER — Encounter (HOSPITAL_COMMUNITY): Payer: Self-pay | Admitting: Emergency Medicine

## 2015-06-14 ENCOUNTER — Emergency Department (HOSPITAL_COMMUNITY): Payer: Medicaid Other

## 2015-06-14 DIAGNOSIS — M545 Low back pain, unspecified: Secondary | ICD-10-CM

## 2015-06-14 DIAGNOSIS — G4733 Obstructive sleep apnea (adult) (pediatric): Secondary | ICD-10-CM

## 2015-06-14 DIAGNOSIS — Z79899 Other long term (current) drug therapy: Secondary | ICD-10-CM | POA: Diagnosis not present

## 2015-06-14 DIAGNOSIS — F329 Major depressive disorder, single episode, unspecified: Secondary | ICD-10-CM | POA: Diagnosis not present

## 2015-06-14 DIAGNOSIS — J45909 Unspecified asthma, uncomplicated: Secondary | ICD-10-CM | POA: Diagnosis not present

## 2015-06-14 DIAGNOSIS — F17210 Nicotine dependence, cigarettes, uncomplicated: Secondary | ICD-10-CM | POA: Diagnosis not present

## 2015-06-14 DIAGNOSIS — R0602 Shortness of breath: Secondary | ICD-10-CM | POA: Insufficient documentation

## 2015-06-14 DIAGNOSIS — Z791 Long term (current) use of non-steroidal anti-inflammatories (NSAID): Secondary | ICD-10-CM | POA: Diagnosis not present

## 2015-06-14 DIAGNOSIS — E119 Type 2 diabetes mellitus without complications: Secondary | ICD-10-CM | POA: Insufficient documentation

## 2015-06-14 DIAGNOSIS — R197 Diarrhea, unspecified: Secondary | ICD-10-CM

## 2015-06-14 DIAGNOSIS — R059 Cough, unspecified: Secondary | ICD-10-CM

## 2015-06-14 DIAGNOSIS — Z7984 Long term (current) use of oral hypoglycemic drugs: Secondary | ICD-10-CM | POA: Insufficient documentation

## 2015-06-14 DIAGNOSIS — R05 Cough: Secondary | ICD-10-CM

## 2015-06-14 LAB — CBC
HCT: 37.2 % — ABNORMAL LOW (ref 39.0–52.0)
Hemoglobin: 12 g/dL — ABNORMAL LOW (ref 13.0–17.0)
MCH: 27.8 pg (ref 26.0–34.0)
MCHC: 32.3 g/dL (ref 30.0–36.0)
MCV: 86.1 fL (ref 78.0–100.0)
Platelets: 318 10*3/uL (ref 150–400)
RBC: 4.32 MIL/uL (ref 4.22–5.81)
RDW: 15.6 % — ABNORMAL HIGH (ref 11.5–15.5)
WBC: 6.9 10*3/uL (ref 4.0–10.5)

## 2015-06-14 LAB — BASIC METABOLIC PANEL
Anion gap: 10 (ref 5–15)
BUN: 18 mg/dL (ref 6–20)
CO2: 27 mmol/L (ref 22–32)
Calcium: 9.2 mg/dL (ref 8.9–10.3)
Chloride: 104 mmol/L (ref 101–111)
Creatinine, Ser: 0.77 mg/dL (ref 0.61–1.24)
GFR calc Af Amer: >60 mL/min (ref >60)
GFR calc non Af Amer: >60 mL/min (ref >60)
Glucose, Bld: 132 mg/dL — ABNORMAL HIGH (ref 65–99)
Potassium: 3.9 mmol/L (ref 3.5–5.1)
Sodium: 141 mmol/L (ref 135–145)

## 2015-06-14 LAB — TROPONIN I: Troponin I: <0.03 ng/mL (ref <0.031)

## 2015-06-14 MED ORDER — IPRATROPIUM-ALBUTEROL 0.5-2.5 (3) MG/3ML IN SOLN
3.0000 mL | Freq: Once | RESPIRATORY_TRACT | Status: AC
  Administered 2015-06-14: 3 mL via RESPIRATORY_TRACT
  Filled 2015-06-14: qty 3

## 2015-06-14 MED ORDER — FAMOTIDINE 20 MG PO TABS
20.0000 mg | ORAL_TABLET | Freq: Once | ORAL | Status: AC
  Administered 2015-06-14: 20 mg via ORAL
  Filled 2015-06-14: qty 1

## 2015-06-14 MED ORDER — HYDROCODONE-ACETAMINOPHEN 5-325 MG PO TABS
2.0000 | ORAL_TABLET | Freq: Once | ORAL | Status: AC
  Administered 2015-06-14: 2 via ORAL
  Filled 2015-06-14: qty 2

## 2015-06-14 MED ORDER — ALUM & MAG HYDROXIDE-SIMETH 200-200-20 MG/5ML PO SUSP
30.0000 mL | Freq: Once | ORAL | Status: AC
  Administered 2015-06-14: 30 mL via ORAL
  Filled 2015-06-14: qty 30

## 2015-06-14 NOTE — ED Notes (Signed)
Pt c/o central chest pressure that radiates to back and SOB since Friday. Pt noted to have bilateral lower extremity edema. Pt states he takes Lasix. Denies CHF. AOx4.

## 2015-06-14 NOTE — Discharge Instructions (Signed)
It was our pleasure to provide your ER care today - we hope that you feel better.  Use your CPAP as instructed - follow up with your doctor to get new device, or current device repaired.   Use albuterol inhaler as need if wheezing.  Follow up with your doctor/cardiologist for recheck in the next few days - call office tomorrow to arrange follow up.  Return to ER if worse, new symptoms, fevers, increased difficulty breathing, persistent or recurrent chest pain, other concern.  You were given pain medication in the ER - no driving for the next 4 hours.      Sleep Apnea  Sleep apnea is a sleep disorder characterized by abnormal pauses in breathing while you sleep. When your breathing pauses, the level of oxygen in your blood decreases. This causes you to move out of deep sleep and into light sleep. As a result, your quality of sleep is poor, and the system that carries your blood throughout your body (cardiovascular system) experiences stress. If sleep apnea remains untreated, the following conditions can develop:  High blood pressure (hypertension).  Coronary artery disease.  Inability to achieve or maintain an erection (impotence).  Impairment of your thought process (cognitive dysfunction). There are three types of sleep apnea:  Obstructive sleep apnea--Pauses in breathing during sleep because of a blocked airway.  Central sleep apnea--Pauses in breathing during sleep because the area of the brain that controls your breathing does not send the correct signals to the muscles that control breathing.  Mixed sleep apnea--A combination of both obstructive and central sleep apnea. RISK FACTORS The following risk factors can increase your risk of developing sleep apnea:  Being overweight.  Smoking.  Having narrow passages in your nose and throat.  Being of older age.  Being male.  Alcohol use.  Sedative and tranquilizer use.  Ethnicity. Among individuals younger than 35  years, African Americans are at increased risk of sleep apnea. SYMPTOMS   Difficulty staying asleep.  Daytime sleepiness and fatigue.  Loss of energy.  Irritability.  Loud, heavy snoring.  Morning headaches.  Trouble concentrating.  Forgetfulness.  Decreased interest in sex.  Unexplained sleepiness. DIAGNOSIS  In order to diagnose sleep apnea, your caregiver will perform a physical examination. A sleep study done in the comfort of your own home may be appropriate if you are otherwise healthy. Your caregiver may also recommend that you spend the night in a sleep lab. In the sleep lab, several monitors record information about your heart, lungs, and brain while you sleep. Your leg and arm movements and blood oxygen level are also recorded. TREATMENT The following actions may help to resolve mild sleep apnea:  Sleeping on your side.   Using a decongestant if you have nasal congestion.   Avoiding the use of depressants, including alcohol, sedatives, and narcotics.   Losing weight and modifying your diet if you are overweight. There also are devices and treatments to help open your airway:  Oral appliances. These are custom-made mouthpieces that shift your lower jaw forward and slightly open your bite. This opens your airway.  Devices that create positive airway pressure. This positive pressure "splints" your airway open to help you breathe better during sleep. The following devices create positive airway pressure:  Continuous positive airway pressure (CPAP) device. The CPAP device creates a continuous level of air pressure with an air pump. The air is delivered to your airway through a mask while you sleep. This continuous pressure keeps your airway open.  Nasal expiratory positive airway pressure (EPAP) device. The EPAP device creates positive air pressure as you exhale. The device consists of single-use valves, which are inserted into each nostril and held in place by  adhesive. The valves create very little resistance when you inhale but create much more resistance when you exhale. That increased resistance creates the positive airway pressure. This positive pressure while you exhale keeps your airway open, making it easier to breath when you inhale again.  Bilevel positive airway pressure (BPAP) device. The BPAP device is used mainly in patients with central sleep apnea. This device is similar to the CPAP device because it also uses an air pump to deliver continuous air pressure through a mask. However, with the BPAP machine, the pressure is set at two different levels. The pressure when you exhale is lower than the pressure when you inhale.  Surgery. Typically, surgery is only done if you cannot comply with less invasive treatments or if the less invasive treatments do not improve your condition. Surgery involves removing excess tissue in your airway to create a wider passage way.   This information is not intended to replace advice given to you by your health care provider. Make sure you discuss any questions you have with your health care provider.   Document Released: 01/28/2002 Document Revised: 02/28/2014 Document Reviewed: 06/16/2011 Elsevier Interactive Patient Education 2016 Elsevier Inc.  Cough, Adult Coughing is a reflex that clears your throat and your airways. Coughing helps to heal and protect your lungs. It is normal to cough occasionally, but a cough that happens with other symptoms or lasts a long time may be a sign of a condition that needs treatment. A cough may last only 2-3 weeks (acute), or it may last longer than 8 weeks (chronic). CAUSES Coughing is commonly caused by:  Breathing in substances that irritate your lungs.  A viral or bacterial respiratory infection.  Allergies.  Asthma.  Postnasal drip.  Smoking.  Acid backing up from the stomach into the esophagus (gastroesophageal reflux).  Certain medicines.  Chronic lung  problems, including COPD (or rarely, lung cancer).  Other medical conditions such as heart failure. HOME CARE INSTRUCTIONS  Pay attention to any changes in your symptoms. Take these actions to help with your discomfort:  Take medicines only as told by your health care provider.  If you were prescribed an antibiotic medicine, take it as told by your health care provider. Do not stop taking the antibiotic even if you start to feel better.  Talk with your health care provider before you take a cough suppressant medicine.  Drink enough fluid to keep your urine clear or pale yellow.  If the air is dry, use a cold steam vaporizer or humidifier in your bedroom or your home to help loosen secretions.  Avoid anything that causes you to cough at work or at home.  If your cough is worse at night, try sleeping in a semi-upright position.  Avoid cigarette smoke. If you smoke, quit smoking. If you need help quitting, ask your health care provider.  Avoid caffeine.  Avoid alcohol.  Rest as needed. SEEK MEDICAL CARE IF:   You have new symptoms.  You cough up pus.  Your cough does not get better after 2-3 weeks, or your cough gets worse.  You cannot control your cough with suppressant medicines and you are losing sleep.  You develop pain that is getting worse or pain that is not controlled with pain medicines.  You have a  fever.  You have unexplained weight loss.  You have night sweats. SEEK IMMEDIATE MEDICAL CARE IF:  You cough up blood.  You have difficulty breathing.  Your heartbeat is very fast.   This information is not intended to replace advice given to you by your health care provider. Make sure you discuss any questions you have with your health care provider.   Document Released: 08/06/2010 Document Revised: 10/29/2014 Document Reviewed: 04/16/2014 Elsevier Interactive Patient Education 2016 Elsevier Inc.    Back Pain, Adult Back pain is very common in  adults.The cause of back pain is rarely dangerous and the pain often gets better over time.The cause of your back pain may not be known. Some common causes of back pain include:  Strain of the muscles or ligaments supporting the spine.  Wear and tear (degeneration) of the spinal disks.  Arthritis.  Direct injury to the back. For many people, back pain may return. Since back pain is rarely dangerous, most people can learn to manage this condition on their own. HOME CARE INSTRUCTIONS Watch your back pain for any changes. The following actions may help to lessen any discomfort you are feeling:  Remain active. It is stressful on your back to sit or stand in one place for long periods of time. Do not sit, drive, or stand in one place for more than 30 minutes at a time. Take short walks on even surfaces as soon as you are able.Try to increase the length of time you walk each day.  Exercise regularly as directed by your health care provider. Exercise helps your back heal faster. It also helps avoid future injury by keeping your muscles strong and flexible.  Do not stay in bed.Resting more than 1-2 days can delay your recovery.  Pay attention to your body when you bend and lift. The most comfortable positions are those that put less stress on your recovering back. Always use proper lifting techniques, including:  Bending your knees.  Keeping the load close to your body.  Avoiding twisting.  Find a comfortable position to sleep. Use a firm mattress and lie on your side with your knees slightly bent. If you lie on your back, put a pillow under your knees.  Avoid feeling anxious or stressed.Stress increases muscle tension and can worsen back pain.It is important to recognize when you are anxious or stressed and learn ways to manage it, such as with exercise.  Take medicines only as directed by your health care provider. Over-the-counter medicines to reduce pain and inflammation are often  the most helpful.Your health care provider may prescribe muscle relaxant drugs.These medicines help dull your pain so you can more quickly return to your normal activities and healthy exercise.  Apply ice to the injured area:  Put ice in a plastic bag.  Place a towel between your skin and the bag.  Leave the ice on for 20 minutes, 2-3 times a day for the first 2-3 days. After that, ice and heat may be alternated to reduce pain and spasms.  Maintain a healthy weight. Excess weight puts extra stress on your back and makes it difficult to maintain good posture. SEEK MEDICAL CARE IF:  You have pain that is not relieved with rest or medicine.  You have increasing pain going down into the legs or buttocks.  You have pain that does not improve in one week.  You have night pain.  You lose weight.  You have a fever or chills. [place]  IF:   You develop new bowel or bladder control problems.  You have unusual weakness or numbness in your arms or legs.  You develop nausea or vomiting.  You develop abdominal pain.  You feel faint.   This information is not intended to replace advice given to you by your health care provider. Make sure you discuss any questions you have with your health care provider.   Document Released: 02/07/2005 Document Revised: 02/28/2014 Document Reviewed: 06/11/2013 Elsevier Interactive Patient Education [employer].

## 2015-06-14 NOTE — Progress Notes (Signed)
Several five second periods of apnea where observed while patient was sleeping, these apneic periods correlated with oxygen desaturations, down to 87% on room air.
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
## 2015-06-22 ENCOUNTER — Telehealth: Payer: Self-pay

## 2015-06-22 NOTE — Telephone Encounter (Signed)
States his pain med needs to be increased to the hydrocodone 10 mg because she 5mg  isn't effective for his pain and the ER dr recommended an increase as well. I advised an OV to discuss change in dose and she said he comes back next month for a follow up. (no open appts but can work in if you want or pt to wait until his appt to discuss?

## 2015-06-22 NOTE — Telephone Encounter (Signed)
Pl give work in appt and he needs to have urine drug screen done

## 2015-06-23 NOTE — Telephone Encounter (Signed)
appt scheduled

## 2015-06-29 ENCOUNTER — Encounter: Payer: Self-pay | Admitting: Family Medicine

## 2015-06-29 ENCOUNTER — Ambulatory Visit: Payer: Medicaid Other | Admitting: Family Medicine

## 2015-06-29 ENCOUNTER — Ambulatory Visit (INDEPENDENT_AMBULATORY_CARE_PROVIDER_SITE_OTHER): Payer: Medicaid Other | Admitting: Family Medicine

## 2015-06-29 VITALS — BP 130/86 | HR 92 | Resp 18 | Ht 64.0 in | Wt 327.0 lb

## 2015-06-29 DIAGNOSIS — R7303 Prediabetes: Secondary | ICD-10-CM

## 2015-06-29 DIAGNOSIS — J309 Allergic rhinitis, unspecified: Secondary | ICD-10-CM | POA: Diagnosis not present

## 2015-06-29 DIAGNOSIS — R5382 Chronic fatigue, unspecified: Secondary | ICD-10-CM

## 2015-06-29 DIAGNOSIS — R0600 Dyspnea, unspecified: Secondary | ICD-10-CM | POA: Diagnosis not present

## 2015-06-29 DIAGNOSIS — G4710 Hypersomnia, unspecified: Secondary | ICD-10-CM

## 2015-06-29 DIAGNOSIS — G4733 Obstructive sleep apnea (adult) (pediatric): Secondary | ICD-10-CM

## 2015-06-29 DIAGNOSIS — I509 Heart failure, unspecified: Secondary | ICD-10-CM

## 2015-06-29 DIAGNOSIS — Z09 Encounter for follow-up examination after completed treatment for conditions other than malignant neoplasm: Secondary | ICD-10-CM

## 2015-06-29 DIAGNOSIS — R7302 Impaired glucose tolerance (oral): Secondary | ICD-10-CM

## 2015-06-29 DIAGNOSIS — E785 Hyperlipidemia, unspecified: Secondary | ICD-10-CM | POA: Diagnosis not present

## 2015-06-29 DIAGNOSIS — G4730 Sleep apnea, unspecified: Secondary | ICD-10-CM

## 2015-06-29 DIAGNOSIS — F17200 Nicotine dependence, unspecified, uncomplicated: Secondary | ICD-10-CM

## 2015-06-29 MED ORDER — AZELASTINE HCL 0.1 % NA SOLN: 2.0000 | Freq: Two times a day (BID) | NASAL | Status: DC

## 2015-06-29 MED ORDER — ATORVASTATIN CALCIUM 20 MG PO TABS: 20.0000 mg | ORAL_TABLET | Freq: Every day | ORAL | Status: DC

## 2015-06-29 NOTE — Patient Instructions (Addendum)
F/u in 3 months, call if you need me before  Appt tomorrow with Dr Luan Pulling, keep appt  NEED to LOSE WEIGHT!!  I am referring you back to cardiologist for re evaluation of heart function  New extra allergy nose spray, contiue flonase  New med for cholesterol  Eat mainly vegetables and fruit  Fasting lipid, cmp and eGFr and hBA1C in 3 months

## 2015-06-29 NOTE — Progress Notes (Signed)
Subjective:    Patient ID: Shawn Ramsey, male    DOB: January 24, 1981, 35 y.o.   MRN: [id number]  HPI Patient in for follow up of recent hospitalization. Discharge summary, and laboratory and radiology data are reviewed, and any questions or concerns about recent hospitalization are discussed. Specific issues requiring follow up are specifically addressed. C/o increased fatigue, poor exercise tolerance, leg swelling , sleeps on hands and feet Daily somnolence with constant snoring  Review of Systems See HPI Denies recent fever or chills. Increased nasal congestion and clear drainage Denies chest congestion, productive cough or wheezing.  Denies abdominal pain, nausea, vomiting,diarrhea or constipation.   Denies dysuria, frequency, hesitancy or incontinence. Chronic joint pain,  and limitation in mobility. Denies , seizures, numbness, or tingling. Denies depression, anxiety or insomnia. Denies skin break down or rash.         Objective:   Physical Exam BP 130/86 mmHg  Pulse 92  Resp 18  Ht 5\' 4"  (1.626 m)  Wt 327 lb (148.326 kg)  BMI 56.10 kg/m2  SpO2 92%   Patient drowsy, observed to be snoring loudly and more asleep than awake during face to face oV and in no cardiopulmonary distress.  HEENT: No facial asymmetry, EOMI,   oropharynx pink and moist.  Neck thick,  no JVD, no mass.  Chest: Clear to auscultation bilaterally.decreased air entry throughout  CVS: S1, S2 no murmurs, no S3.Regular rate.  ABD: one plusNo edema  MS: decreased ROM spine, shoulders, hips and knees.  Skin: Intact, no ulcerations or rash noted.  Psych: poor eye contact, blunted affect. Memory impaired, mild to medrate cognitive impairment  CNS: CN 2-12 intact, power,  normal throughout.no focal deficits noted.       Assessment & Plan:  Hospital discharge follow-up mild improvement but continues to have significant upper airway compromise with constant snoring and falling asleep  sitting up in the chair during the exam. Has still not started using his CPAP, has appt in am with pulmonary  Needs a functional airwayto  Sustain life and improve his health Pulmonary Doc Luan Pulling working aggressively on this, which is appreciated Mother also strongly encouraged to ascertain change in diet with weigh loss, he continues to gain weight, unfortunately  Morbid obesity Deteriorated. Patient re-educated about  the importance of commitment to a  minimum of 150 minutes of exercise per week.  The importance of healthy food choices with portion control discussed. Encouraged to start a food diary, count calories and to consider  joining a support group. Sample diet sheets offered. Goals set by the patient for the next several months.   Weight /BMI 06/29/2015 04/28/2015 04/17/2015  WEIGHT 327 lb 319 lb 313 lb 12.8 oz  HEIGHT 5\' 4"  5\' 4"  5\' 4"   BMI 56.1 kg/m2 54.73 kg/m2 53.84 kg/m2    Current exercise per week zero   Sleep apnea Severe and progressing, still has no durable equipment which he uses on a regular basis to treat this.Pulmonary working on this   Prediabetes Patient educated about the importance of limiting  Carbohydrate intake , the need to commit to daily physical activity for a minimum of 30 minutes , and to commit weight loss. The fact that changes in all these areas will reduce or eliminate all together the development of diabetes is stressed.   Diabetic Labs Latest Ref Rng 06/14/2015 04/28/2015 04/07/2015 02/11/2015 01/26/2015  HbA1c <5.7 % - 6.3(H) - - -  Chol 125 - 200 mg/dL - 205(H) - - -  HDL >=40 mg/dL - 31(L) - - -  Calc LDL <130 mg/dL - 133(H) - - -  Triglycerides <150 mg/dL - 203(H) - - -  Creatinine 0.61 - 1.24 mg/dL 0.77 0.93 0.77 0.75 0.88   BP/Weight 06/29/2015 06/14/2015 04/28/2015 04/17/2015 01/27/2015 01/26/2015 123456  Systolic BP [id number] 0000000 Q000111Q A999333 Q000111Q - -  Diastolic BP 86 93 86 83 99991111 - -  Wt. (Lbs) 327 - 319 313.8 - 313.4 305  BMI 56.1 - 54.73 53.84 -  52.15 50.75  Some encounter information is confidential and restricted. Go to Review Flowsheets activity to see all data.   No flowsheet data found.      NICOTINE ADDICTION Patient counseled for approximately 5 minutes regarding the health risks of ongoing nicotine use, specifically all types of cancer, heart disease, stroke and respiratory failure. The options available for help with cessation ,the behavioral changes to assist the process, and the option to either gradully reduce usage  Or abruptly stop.is also discussed. Pt is also encouraged to set specific goals in number of cigarettes used daily, as well as to set a quit date.     Allergic sinusitis Increased symptoms, currently uncontrolled , add flonase  OSA (obstructive sleep apnea) Progressively worsening with s/s of cor pulmonale, cardiology to further eval  Dyslipidemia Start lipitor Hyperlipidemia:Low fat diet discussed and encouraged.   Lipid Panel  Lab Results  Component Value Date   CHOL 205* 04/28/2015   HDL 31* 04/28/2015   LDLCALC 133* 04/28/2015   TRIG 203* 04/28/2015   CHOLHDL 6.6* 04/28/2015

## 2015-07-05 ENCOUNTER — Encounter: Payer: Self-pay | Admitting: Family Medicine

## 2015-07-05 NOTE — Assessment & Plan Note (Signed)
Increased symptoms, currently uncontrolled , add flonase

## 2015-07-05 NOTE — Assessment & Plan Note (Signed)
Patient educated about the importance of limiting  Carbohydrate intake , the need to commit to daily physical activity for a minimum of 30 minutes , and to commit weight loss. The fact that changes in all these areas will reduce or eliminate all together the development of diabetes is stressed.   Diabetic Labs Latest Ref Rng 06/14/2015 04/28/2015 04/07/2015 02/11/2015 01/26/2015  HbA1c <5.7 % - 6.3(H) - - -  Chol 125 - 200 mg/dL - 205(H) - - -  HDL >=40 mg/dL - 31(L) - - -  Calc LDL <130 mg/dL - 133(H) - - -  Triglycerides <150 mg/dL - 203(H) - - -  Creatinine 0.61 - 1.24 mg/dL 0.77 0.93 0.77 0.75 0.88   BP/Weight 06/29/2015 06/14/2015 04/28/2015 04/17/2015 01/27/2015 01/26/2015 [id number]  Systolic BP [id number] 0000000 Q000111Q A999333 Q000111Q - -  Diastolic BP 86 93 86 83 99991111 - -  Wt. (Lbs) 327 - 319 313.8 - 313.4 305  BMI 56.1 - 54.73 53.84 - 52.15 50.75  Some encounter information is confidential and restricted. Go to Review Flowsheets activity to see all data.   No flowsheet data found.

## 2015-07-05 NOTE — Assessment & Plan Note (Signed)
Deteriorated. Patient re-educated about  the importance of commitment to a  minimum of 150 minutes of exercise per week.  The importance of healthy food choices with portion control discussed. Encouraged to start a food diary, count calories and to consider  joining a support group. Sample diet sheets offered. Goals set by the patient for the next several months.   Weight /BMI 06/29/2015 04/28/2015 04/17/2015  WEIGHT 327 lb 319 lb 313 lb 12.8 oz  HEIGHT 5\' 4"  5\' 4"  5\' 4"   BMI 56.1 kg/m2 54.73 kg/m2 53.84 kg/m2    Current exercise per week zero

## 2015-07-05 NOTE — Assessment & Plan Note (Signed)
Severe and progressing, still has no durable equipment which he uses on a regular basis to treat this.Pulmonary working on this

## 2015-07-05 NOTE — Assessment & Plan Note (Signed)
Start lipitor Hyperlipidemia:Low fat diet discussed and encouraged.   Lipid Panel  Lab Results  Component Value Date   CHOL 205* 04/28/2015   HDL 31* 04/28/2015   LDLCALC 133* 04/28/2015   TRIG 203* 04/28/2015   CHOLHDL 6.6* 04/28/2015

## 2015-07-05 NOTE — Assessment & Plan Note (Signed)
Progressively worsening with s/s of cor pulmonale, cardiology to further eval

## 2015-07-05 NOTE — Assessment & Plan Note (Signed)
mild improvement but continues to have significant upper airway compromise with constant snoring and falling asleep sitting up in the chair during the exam. Has still not started using his CPAP, has appt in am with pulmonary  Needs a functional airwayto  Sustain life and improve his health Pulmonary Doc Luan Pulling working aggressively on this, which is appreciated Mother also strongly encouraged to ascertain change in diet with weigh loss, he continues to gain weight, unfortunately

## 2015-07-05 NOTE — Assessment & Plan Note (Signed)

## 2015-07-08 ENCOUNTER — Ambulatory Visit (INDEPENDENT_AMBULATORY_CARE_PROVIDER_SITE_OTHER): Payer: Medicaid Other | Admitting: Cardiology

## 2015-07-08 ENCOUNTER — Encounter: Payer: Self-pay | Admitting: Cardiology

## 2015-07-08 ENCOUNTER — Telehealth: Payer: Self-pay

## 2015-07-08 VITALS — BP 104/64 | HR 99 | Ht 65.0 in | Wt 328.0 lb

## 2015-07-08 DIAGNOSIS — Z91199 Patient's noncompliance with other medical treatment and regimen due to unspecified reason: Secondary | ICD-10-CM

## 2015-07-08 DIAGNOSIS — Z9119 Patient's noncompliance with other medical treatment and regimen: Secondary | ICD-10-CM

## 2015-07-08 DIAGNOSIS — E662 Morbid (severe) obesity with alveolar hypoventilation: Secondary | ICD-10-CM

## 2015-07-08 DIAGNOSIS — R0602 Shortness of breath: Secondary | ICD-10-CM | POA: Diagnosis not present

## 2015-07-08 DIAGNOSIS — G4733 Obstructive sleep apnea (adult) (pediatric): Secondary | ICD-10-CM | POA: Diagnosis not present
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
## 2015-07-08 NOTE — Patient Instructions (Addendum)
Your physician recommends that you continue on your current medications as directed. Please refer to the Current Medication list given to you today. Your physician has requested that you have an echocardiogram. Echocardiography is a painless test that uses sound waves to create images of your heart. It provides your doctor with information about the size and shape of your heart and how well your heart's chambers and valves are working. This procedure takes approximately one hour. There are no restrictions for this procedure. We will call you with your results. Dr. Griffin Dakin office has been contacted today so that it can be arranged for you to take your medication bottles to that office, with your mother, so that your medications can be sorted out especially your diuretics.

## 2015-07-08 NOTE — Telephone Encounter (Signed)
Nurse visit is  Adequate, just  Do accurate rec, and  Stress need for pt to take meds prescribed, he can's be appropriately treated if he does not do this i will re irterate after the med rec is done but do not need oV with him

## 2015-07-10 ENCOUNTER — Ambulatory Visit (HOSPITAL_COMMUNITY): Payer: Medicaid Other

## 2015-07-10 NOTE — Telephone Encounter (Signed)
Patient will come in on 5/22 with his mother and all of his medications.

## 2015-07-14 ENCOUNTER — Ambulatory Visit (HOSPITAL_COMMUNITY)
Admission: RE | Admit: 2015-07-14 | Discharge: 2015-07-14 | Disposition: A | Discharge status: Home / Self Care | Payer: Medicaid Other | Source: Ambulatory Visit | Attending: Cardiology | Admitting: Cardiology

## 2015-07-14 DIAGNOSIS — I071 Rheumatic tricuspid insufficiency: Secondary | ICD-10-CM | POA: Insufficient documentation

## 2015-07-14 DIAGNOSIS — I340 Nonrheumatic mitral (valve) insufficiency: Secondary | ICD-10-CM | POA: Diagnosis not present

## 2015-07-14 DIAGNOSIS — R0602 Shortness of breath: Secondary | ICD-10-CM | POA: Insufficient documentation

## 2015-07-14 DIAGNOSIS — R0600 Dyspnea, unspecified: Secondary | ICD-10-CM | POA: Diagnosis not present

## 2015-07-14 DIAGNOSIS — G4733 Obstructive sleep apnea (adult) (pediatric): Secondary | ICD-10-CM | POA: Insufficient documentation

## 2015-07-14 DIAGNOSIS — E785 Hyperlipidemia, unspecified: Secondary | ICD-10-CM | POA: Insufficient documentation

## 2015-07-15 ENCOUNTER — Telehealth: Payer: Self-pay | Admitting: *Deleted

## 2015-07-15 NOTE — Telephone Encounter (Signed)
-----   Message from Satira Sark, MD sent at 07/14/2015 11:46 AM EDT ----- Reviewed study. Please let him know that findings are overall stable with normal LVEF, and no substantial degree of right ventricular dysfunction. No further cardiac workup anticipated now, he needs to keep close follow-up for pulmonary evaluation as before - this is the main issue.

## 2015-07-15 NOTE — Telephone Encounter (Signed)
Patient informed and verbalized understanding

## 2015-07-21 ENCOUNTER — Other Ambulatory Visit: Payer: Self-pay

## 2015-07-21 MED ORDER — HYDROCODONE-ACETAMINOPHEN 5-325 MG PO TABS: 1.0000 | ORAL_TABLET | Freq: Every day | ORAL | Status: DC | PRN

## 2015-07-24 ENCOUNTER — Telehealth: Payer: Self-pay | Admitting: Family Medicine

## 2015-07-24 NOTE — Telephone Encounter (Signed)
Shawn Ramsey is stating that Shawn Ramsey Left and Right foot both are swollen, Left worse legs as well, he cant even get his shoe on, please advise?

## 2015-07-24 NOTE — Telephone Encounter (Signed)
Advised mother to contact cardiology as PCP is out of office until 6/12.   Patient also has script ready for Hydrocodone that mother will come and collect.

## 2015-07-31 ENCOUNTER — Other Ambulatory Visit: Payer: Self-pay

## 2015-07-31 MED ORDER — AMLODIPINE BESYLATE 5 MG PO TABS: 5.0000 mg | ORAL_TABLET | Freq: Every day | ORAL | Status: DC

## 2015-08-04 ENCOUNTER — Ambulatory Visit: Payer: Medicaid Other | Admitting: Family Medicine

## 2015-08-11 ENCOUNTER — Telehealth: Payer: Self-pay | Admitting: Family Medicine

## 2015-08-11 NOTE — Telephone Encounter (Signed)
Shawn Ramsey is calling in regards to Shawn Ramsey, Shawn Ramsey wants to know if Dr. [name] can prescribe him something to curb his appetite, he is up all day and during the night eating since he cant sleep, please advise?

## 2015-08-12 ENCOUNTER — Telehealth: Payer: Self-pay | Admitting: Cardiology

## 2015-08-12 DIAGNOSIS — G4733 Obstructive sleep apnea (adult) (pediatric): Secondary | ICD-10-CM

## 2015-08-12 MED ORDER — LORCASERIN HCL 10 MG PO TABS: 1.0000 | ORAL_TABLET | Freq: Two times a day (BID) | ORAL | Status: DC

## 2015-08-12 NOTE — Telephone Encounter (Signed)
Mother aware

## 2015-08-12 NOTE — Telephone Encounter (Signed)
Dr [name] office advised patient to contact our office.  Was told that he is retaining too much fluid.

## 2015-08-12 NOTE — Telephone Encounter (Signed)
I recommend he gets help from psychiatry for this, I have prescribed belviq, 2 months, needs  To make and keep 6 to 7 week appt if none, also she needs to remove unhealthy food and drink from the home

## 2015-08-12 NOTE — Telephone Encounter (Addendum)
Attempted to return call - mother [name]) answered.  Poor connection on cell & call was disconnected.

## 2015-08-18 NOTE — Telephone Encounter (Signed)
Pt called c/o swelling in both legs and hands, SOB, doesn't know what weight is today no scale at home. Says not on lasix only taking torsemide 20 mg twice daily per Dr. Luan Pulling - says has appt to see Dr Luan Pulling on 7/13. Says has cut back on sodium intake but no exercise . Says this has been been going on since last year, with pcp deferring swelling problems to cardiologist per telephone note. Recent echo was overall normal and was seen by Dr. Domenic Polite 06/2015. Will forward to DOD with Dr. Domenic Polite out of office.

## 2015-08-19 ENCOUNTER — Other Ambulatory Visit: Payer: Self-pay

## 2015-08-19 MED ORDER — HYDROCODONE-ACETAMINOPHEN 5-325 MG PO TABS: 1.0000 | ORAL_TABLET | Freq: Every day | ORAL | Status: DC | PRN

## 2015-08-19 NOTE — Telephone Encounter (Signed)
As per Dr. Myles Gip office note, "Main issue for treatment however will be optimization of his pulmonary condition which would include compliance with CPAP, weight loss, and regular use of medications including his diuretics."  Has he been compliant with CPAP? If he does not follow Dr. Myles Gip advice, he will have continued problems with swelling. Weight loss attempted?  Can try increasing torsemide to 40 mg bid x 3 days and check BMET in 2 days.

## 2015-08-19 NOTE — Addendum Note (Signed)
Addended by: Julian Hy T on: 08/19/2015 10:20 AM   Modules accepted: Orders

## 2015-08-19 NOTE — Telephone Encounter (Signed)
Pt voiced understanding - lab orders sent to pt

## 2015-09-17 ENCOUNTER — Telehealth: Payer: Self-pay | Admitting: Family Medicine

## 2015-09-17 NOTE — Telephone Encounter (Signed)
He's gathering fluid and the lasix pills aren't helping. Wants to be referred to [facility]? Please call back

## 2015-09-18 NOTE — Telephone Encounter (Signed)
Aware to take the torsemide on a regular basis- not just when needed and to go to the ER if swelling worsens

## 2015-09-18 NOTE — Telephone Encounter (Signed)
pls explain to Mother if he does not take med prescribed his legs will continue to and tremain swollen. NEEDS to fill and take the torsemide. If after taking it still excess swelling the go to hospital, BUT explain that it is clear to [place] that he does not take the medication prescribed to help him , and this is why he CONTINUES to have the problem

## 2015-09-18 NOTE — Telephone Encounter (Signed)
Mother states that Shawn Ramsey legs are still swelling and the lasix isn't helping. Asked that he be referred to the hospital. Per printed list from the pharmacy he isn't on lasix but last collected Torsemide 20mg  bid on 07/31/2015. Hasn't collected July rx yet. Any recommendations?

## 2015-09-19 ENCOUNTER — Other Ambulatory Visit: Payer: Self-pay

## 2015-09-19 MED ORDER — HYDROCODONE-ACETAMINOPHEN 5-325 MG PO TABS: 1.0000 | ORAL_TABLET | Freq: Every day | ORAL | 0 refills | Status: DC | PRN

## 2015-09-30 ENCOUNTER — Encounter: Payer: Self-pay | Admitting: Family Medicine

## 2015-09-30 ENCOUNTER — Ambulatory Visit (INDEPENDENT_AMBULATORY_CARE_PROVIDER_SITE_OTHER): Payer: Medicaid Other | Admitting: Family Medicine

## 2015-09-30 VITALS — BP 126/70 | HR 92 | Resp 18 | Ht 65.0 in | Wt 336.0 lb

## 2015-09-30 DIAGNOSIS — I872 Venous insufficiency (chronic) (peripheral): Secondary | ICD-10-CM | POA: Diagnosis not present

## 2015-09-30 DIAGNOSIS — R7303 Prediabetes: Secondary | ICD-10-CM

## 2015-09-30 DIAGNOSIS — M79605 Pain in left leg: Secondary | ICD-10-CM

## 2015-09-30 DIAGNOSIS — G4733 Obstructive sleep apnea (adult) (pediatric): Secondary | ICD-10-CM | POA: Diagnosis not present

## 2015-09-30 DIAGNOSIS — E785 Hyperlipidemia, unspecified: Secondary | ICD-10-CM

## 2015-09-30 DIAGNOSIS — M79604 Pain in right leg: Secondary | ICD-10-CM

## 2015-09-30 DIAGNOSIS — F17200 Nicotine dependence, unspecified, uncomplicated: Secondary | ICD-10-CM

## 2015-09-30 DIAGNOSIS — J45991 Cough variant asthma: Secondary | ICD-10-CM

## 2015-09-30 NOTE — Patient Instructions (Addendum)
F/u in 3 months, call if you need me before  Fasting labs next week  PLEASE change eating so that you lose weight  No swelling of legs noted today  You are more alert  COMMIT to using CPAP every night  Contact  Weight loss surgery team to see if you will qualify  For weight loss surgery.  The way for you to improve your health, is to [place], keep working at it  Also , no more smoking!, please  Thank you  for choosing [facility]. We consider it a privelige to serve you.  Delivering excellent health care in a caring and  compassionate way is our goal.  Partnering with you,  so that together we can achieve this goal is our strategy.

## 2015-10-04 ENCOUNTER — Encounter: Payer: Self-pay | Admitting: Family Medicine

## 2015-10-04 NOTE — Assessment & Plan Note (Signed)

## 2015-10-04 NOTE — Assessment & Plan Note (Signed)
Controlled and managed by pulmonary

## 2015-10-04 NOTE — Assessment & Plan Note (Signed)
Improved leg swelling, pt to continue fluid restriction and use of CPAP

## 2015-10-04 NOTE — Assessment & Plan Note (Signed)
Finally has started using CPAP , though not at 100%, uses it approx 50% of the time, and is more alert at visit, encouraged to continue same

## 2015-10-04 NOTE — Assessment & Plan Note (Signed)
Hyperlipidemia:Low fat diet discussed and encouraged.   Lipid Panel  Lab Results  Component Value Date   CHOL 205 (H) 04/28/2015   HDL 31 (L) 04/28/2015   LDLCALC 133 (H) 04/28/2015   TRIG 203 (H) 04/28/2015   CHOLHDL 6.6 (H) 04/28/2015   Updated lab needed at/ before next visit.

## 2015-10-04 NOTE — Assessment & Plan Note (Signed)
Patient educated about the importance of limiting  Carbohydrate intake , the need to commit to daily physical activity for a minimum of 30 minutes , and to commit weight loss. The fact that changes in all these areas will reduce or eliminate all together the development of diabetes is stressed.   Diabetic Labs Latest Ref Rng & Units 06/14/2015 04/28/2015 04/07/2015 02/11/2015 01/26/2015  HbA1c <5.7 % - 6.3(H) - - -  Chol 125 - 200 mg/dL - 205(H) - - -  HDL >=40 mg/dL - 31(L) - - -  Calc LDL <130 mg/dL - 133(H) - - -  Triglycerides <150 mg/dL - 203(H) - - -  Creatinine 0.61 - 1.24 mg/dL 0.77 0.93 0.77 0.75 0.88   BP/Weight 09/30/2015 07/08/2015 06/29/2015 06/14/2015 04/28/2015 04/17/2015 99991111  Systolic BP 123XX123 123456 [id number] 0000000 Q000111Q A999333 Q000111Q  Diastolic BP 70 64 86 93 86 83 101  Wt. (Lbs) 336 328 327 - 319 313.8 -  BMI 55.91 54.58 56.1 - 54.73 53.84 -  Some encounter information is confidential and restricted. Go to Review Flowsheets activity to see all data.   No flowsheet data found.  Updated lab needed at/ before next visit.

## 2015-10-04 NOTE — Progress Notes (Signed)
Shawn Ramsey     MRN: [id number]      DOB: 10-04-1980   HPI Shawn Ramsey is here for follow up and re-evaluation of chronic medical conditions, medication management and review of any available recent lab and radiology data.  Preventive health is updated, specifically  Cancer screening and Immunization.   Questions or concerns regarding consultations or procedures which the PT has had in the interim are  Addressed.Has now started using his CPAP machine for at least 5 hours per night, still getting accustomed to it. States now ready to consider bariatric surgrey as he is unable to lose weight and knows that this is necessary , contact info again provided to pt and is mother.  The PT denies any adverse reactions to current medications since the last visit. Unable to afford belviq Notes improvement in his leg swelling  ROS Denies recent fever or chills. Denies sinus pressure, nasal congestion, ear pain or sore throat. Denies chest congestion, productive cough or wheezing. Denies chest pains, has chronic orthopnea,  And reports less  leg swelling Denies abdominal pain, nausea, vomiting,diarrhea or constipation.   Denies dysuria, frequency, hesitancy or incontinence. C/o chronic back and left lower extremity pain and limitation in mobility. Denies headaches, seizures, numbness, or tingling. Denies depression, anxiety or insomnia. Denies skin break down or rash.   PE  BP 126/70   Pulse 92   Resp 18   Ht 5\' 5"  (1.651 m)   Wt (!) 336 lb (152.4 kg)   SpO2 94%   BMI 55.91 kg/m   Patient more alert and oriented and in mild  cardiopulmonary distress, at rest  HEENT: No facial asymmetry, EOMI,   oropharynx pink and moist.  Neck supple no JVD, no mass.  Chest: decreased though adequate air entry, bibasilar crackles  CVS: S1, S2 no murmurs, no S3.Regular rate.  ABD: Soft non tender.   Ext: trace  edema  MS: Adequate though reduced  ROM spine, shoulders, hips and knees.  Skin:  Intact, no ulcerations or rash noted.  Psych: Good eye contact, normal affect. 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  CNS: CN 2-12 intact, power,  normal throughout.no focal deficits noted.   Assessment & Plan OSA (obstructive sleep apnea) Finally has started using CPAP , though not at 100%, uses it approx 50% of the time, and is more alert at visit, encouraged to continue same  Chronic venous insufficiency Improved leg swelling, pt to continue fluid restriction and use of CPAP  Prediabetes Patient educated about the importance of limiting  Carbohydrate intake , the need to commit to daily physical activity for a minimum of 30 minutes , and to commit weight loss. The fact that changes in all these areas will reduce or eliminate all together the development of diabetes is stressed.   Diabetic Labs Latest Ref Rng & Units 06/14/2015 04/28/2015 04/07/2015 02/11/2015 01/26/2015  HbA1c <5.7 % - 6.3(H) - - -  Chol 125 - 200 mg/dL - 205(H) - - -  HDL >=40 mg/dL - 31(L) - - -  Calc LDL <130 mg/dL - 133(H) - - -  Triglycerides <150 mg/dL - 203(H) - - -  Creatinine 0.61 - 1.24 mg/dL 0.77 0.93 0.77 0.75 0.88   BP/Weight 09/30/2015 07/08/2015 06/29/2015 06/14/2015 04/28/2015 04/17/2015 99991111  Systolic BP 123XX123 123456 [id number] 0000000 Q000111Q A999333 Q000111Q  Diastolic BP 70 64 86 93 86 83 101  Wt. (Lbs) 336 328 327 - 319 313.8 -  BMI 55.91 54.58  56.1 - 54.73 53.84 -  Some encounter information is confidential and restricted. Go to Review Flowsheets activity to see all data.   No flowsheet data found.  Updated lab needed at/ before next visit.   NICOTINE ADDICTION Patient counseled for approximately 5 minutes regarding the health risks of ongoing nicotine use, specifically all types of cancer, heart disease, stroke and respiratory failure. The options available for help with cessation ,the behavioral changes to assist the process, and the option to either gradully reduce usage  Or abruptly stop.is also  discussed. Pt is also encouraged to set specific goals in number of cigarettes used daily, as well as to set a quit date.     Leg pain, bilateral Unchanged, chronic medication as before  Dyslipidemia Hyperlipidemia:Low fat diet discussed and encouraged.   Lipid Panel  Lab Results  Component Value Date   CHOL 205 (H) 04/28/2015   HDL 31 (L) 04/28/2015   LDLCALC 133 (H) 04/28/2015   TRIG 203 (H) 04/28/2015   CHOLHDL 6.6 (H) 04/28/2015   Updated lab needed at/ before next visit.     COUGH VARIANT ASTHMA Controlled and managed by pulmonary

## 2015-10-04 NOTE — Assessment & Plan Note (Signed)
Unchanged, chronic medication as before

## 2015-10-15 ENCOUNTER — Other Ambulatory Visit: Payer: Self-pay

## 2015-10-15 MED ORDER — HYDROCODONE-ACETAMINOPHEN 5-325 MG PO TABS: 1.0000 | ORAL_TABLET | Freq: Every day | ORAL | 0 refills | Status: DC | PRN

## 2015-11-11 ENCOUNTER — Other Ambulatory Visit: Payer: Self-pay

## 2015-11-11 MED ORDER — HYDROCODONE-ACETAMINOPHEN 5-325 MG PO TABS: 1.0000 | ORAL_TABLET | Freq: Every day | ORAL | 0 refills | Status: DC | PRN

## 2015-11-13 ENCOUNTER — Other Ambulatory Visit: Payer: Self-pay | Admitting: Family Medicine

## 2015-12-08 DIAGNOSIS — G8929 Other chronic pain: Secondary | ICD-10-CM | POA: Insufficient documentation

## 2015-12-21 ENCOUNTER — Other Ambulatory Visit: Payer: Self-pay

## 2015-12-21 MED ORDER — HYDROCODONE-ACETAMINOPHEN 5-325 MG PO TABS: 1.0000 | ORAL_TABLET | Freq: Every day | ORAL | 0 refills | Status: DC | PRN

## 2015-12-31 ENCOUNTER — Ambulatory Visit: Payer: Medicaid Other | Admitting: Family Medicine

## 2016-02-04 ENCOUNTER — Ambulatory Visit (INDEPENDENT_AMBULATORY_CARE_PROVIDER_SITE_OTHER): Payer: Medicaid Other | Admitting: Family Medicine

## 2016-02-04 ENCOUNTER — Encounter: Payer: Self-pay | Admitting: Family Medicine

## 2016-02-04 VITALS — BP 122/82 | HR 90 | Resp 18 | Ht 65.0 in | Wt 338.1 lb

## 2016-02-04 DIAGNOSIS — Z23 Encounter for immunization: Secondary | ICD-10-CM | POA: Diagnosis not present

## 2016-02-04 DIAGNOSIS — I10 Essential (primary) hypertension: Secondary | ICD-10-CM | POA: Diagnosis not present

## 2016-02-04 DIAGNOSIS — E785 Hyperlipidemia, unspecified: Secondary | ICD-10-CM

## 2016-02-04 DIAGNOSIS — M79605 Pain in left leg: Secondary | ICD-10-CM | POA: Diagnosis not present

## 2016-02-04 DIAGNOSIS — G4733 Obstructive sleep apnea (adult) (pediatric): Secondary | ICD-10-CM

## 2016-02-04 DIAGNOSIS — R7303 Prediabetes: Secondary | ICD-10-CM

## 2016-02-04 DIAGNOSIS — E8881 Metabolic syndrome: Secondary | ICD-10-CM

## 2016-02-04 NOTE — Assessment & Plan Note (Signed)
Chronic pain management changed from opioid to ibuprofen, needs to get labs drawn today

## 2016-02-04 NOTE — Patient Instructions (Signed)
F/U in 4.5 month, call if you need me before   No more hydrocodone for pain management  New will be ibuprofen 80-0 mg one at bedtime, after I see blood work  USE CPAP every night    Labs today so medications can be prescribed  Thank you  for choosing [facility]. We consider it a privelige to serve you.  Delivering excellent health care in a caring and  compassionate way is our goal.  Partnering with you,  so that together we can achieve this goal is our strategy.   No BREAD, CAKE, PIES, COOKIES  No sweet drinks, soda, juice  Five small meals in small plate evey day

## 2016-02-04 NOTE — Assessment & Plan Note (Signed)
Hyperlipidemia:Low fat diet discussed and encouraged.   Lipid Panel  Lab Results  Component Value Date   CHOL 205 (H) 04/28/2015   HDL 31 (L) 04/28/2015   LDLCALC 133 (H) 04/28/2015   TRIG 203 (H) 04/28/2015   CHOLHDL 6.6 (H) 04/28/2015   Updated lab needed at/ before next visit.

## 2016-02-04 NOTE — Assessment & Plan Note (Signed)
Deteriorated. Patient re-educated about  the importance of commitment to a  minimum of 150 minutes of exercise per week.  The importance of healthy food choices with portion control discussed. Encouraged to start a food diary, count calories and to consider  joining a support group. Sample diet sheets offered. Goals set by the patient for the next several months.   Weight /BMI 02/04/2016 09/30/2015 07/08/2015  WEIGHT 338 lb 1.9 oz 336 lb 328 lb  HEIGHT 5\' 5"  5\' 5"  5\' 5"   BMI 56.27 kg/m2 55.91 kg/m2 54.58 kg/m2  Some encounter information is confidential and restricted. Go to Review Flowsheets activity to see all data.

## 2016-02-04 NOTE — Assessment & Plan Note (Signed)
Controlled, no change in medication DASH diet and commitment to daily physical activity for a minimum of 30 minutes discussed and encouraged, as a part of hypertension management. The importance of attaining a healthy weight is also discussed.  BP/Weight 02/04/2016 09/30/2015 07/08/2015 06/29/2015 06/14/2015 04/28/2015 99991111  Systolic BP 123XX123 123XX123 123456 [id number] 0000000 Q000111Q A999333  Diastolic BP 82 70 64 86 93 86 83  Wt. (Lbs) 338.12 336 328 327 - 319 313.8  BMI 56.27 55.91 54.58 56.1 - 54.73 53.84  Some encounter information is confidential and restricted. Go to Review Flowsheets activity to see all data.

## 2016-02-04 NOTE — Assessment & Plan Note (Signed)
importance of daily use of equipment is discussed and stressed

## 2016-02-04 NOTE — Assessment & Plan Note (Signed)
The increased risk of cardiovascular disease associated with this diagnosis, and the need to consistently work on lifestyle to change this is discussed. Following  a  heart healthy diet ,commitment to 30 minutes of exercise at least 5 days per week, as well as control of blood sugar and cholesterol , and achieving a healthy weight are all the areas to be addressed .  

## 2016-02-04 NOTE — Assessment & Plan Note (Signed)
Patient educated about the importance of limiting  Carbohydrate intake , the need to commit to daily physical activity for a minimum of 30 minutes , and to commit weight loss. The fact that changes in all these areas will reduce or eliminate all together the development of diabetes is stressed.   Diabetic Labs Latest Ref Rng & Units 06/14/2015 04/28/2015 04/07/2015 02/11/2015 01/26/2015  HbA1c <5.7 % - 6.3(H) - - -  Chol 125 - 200 mg/dL - 205(H) - - -  HDL >=40 mg/dL - 31(L) - - -  Calc LDL <130 mg/dL - 133(H) - - -  Triglycerides <150 mg/dL - 203(H) - - -  Creatinine 0.61 - 1.24 mg/dL 0.77 0.93 0.77 0.75 0.88   BP/Weight 02/04/2016 09/30/2015 07/08/2015 06/29/2015 06/14/2015 04/28/2015 99991111  Systolic BP 123XX123 123XX123 123456 [id number] 0000000 Q000111Q A999333  Diastolic BP 82 70 64 86 93 86 83  Wt. (Lbs) 338.12 336 328 327 - 319 313.8  BMI 56.27 55.91 54.58 56.1 - 54.73 53.84  Some encounter information is confidential and restricted. Go to Review Flowsheets activity to see all data.   No flowsheet data found.

## 2016-02-04 NOTE — Assessment & Plan Note (Signed)
Markedly improved 

## 2016-02-04 NOTE — Progress Notes (Signed)
DUSTYN BELLIS     MRN: [id number]      DOB: Aug 23, 1980   HPI Mr. Lynds is here for follow up and re-evaluation of chronic medical conditions, medication management and review of any available recent lab and radiology data.  Preventive health is updated, specifically  Cancer screening and Immunization.   Questions or concerns regarding consultations or procedures which the PT has had in the interim are  Addressed.Using cPAP intermittently Pleased with leg swelling C/o left leg pain, I explain that chronic pain management is being changed to ibuprofen, or he will need to be seen by pain specialist Has decided against surgery for weight loss, now asking about phentermine and injections. I review dietary change and recommend appt with nutrition educator, no real commitment to the referral The PT denies any adverse reactions to current medications since the last visit.  There are no new concerns.  There are no specific complaints   ROS Denies recent fever or chills. Denies sinus pressure, nasal congestion, ear pain or sore throat. Denies chest congestion, productive cough or wheezing. Denies chest pains, palpitations and leg swelling Denies abdominal pain, nausea, vomiting,diarrhea or constipation.   Denies dysuria, frequency, hesitancy or incontinence. Denies joint pain, swelling and limitation in mobility. Denies headaches, seizures, numbness, or tingling. Denies depression, anxiety or insomnia. Denies skin break down or rash.   PE  BP 122/82   Pulse 90   Resp 18   Ht 5\' 5"  (1.651 m)   Wt (!) 338 lb 1.9 oz (153.4 kg)   SpO2 93%   BMI 56.27 kg/m   Patient alert and oriented and in no cardiopulmonary distress.  HEENT: No facial asymmetry, EOMI,   oropharynx pink and moist.  Neck supple no JVD, no mass.  Chest: Clear to auscultation bilaterally.  CVS: S1, S2 no murmurs, no S3.Regular rate.  ABD: Soft non tender.   Ext: No edema  MS: Adequate ROM spine, shoulders,  hips and knees.  Skin: Intact, no ulcerations or rash noted.  Psych: Good eye contact, normal affect. Memory intact not anxious or depressed appearing.  CNS: CN 2-12 intact, power,  normal throughout.no focal deficits noted.   Assessment & Plan  Essential hypertension Controlled, no change in medication DASH diet and commitment to daily physical activity for a minimum of 30 minutes discussed and encouraged, as a part of hypertension management. The importance of attaining a healthy weight is also discussed.  BP/Weight 02/04/2016 09/30/2015 07/08/2015 06/29/2015 06/14/2015 04/28/2015 99991111  Systolic BP 123XX123 123XX123 123456 [id number]-[id number] 0000000 Q000111Q A999333  Diastolic BP 82 70 64 86 93 86 83  Wt. (Lbs) 338.12 336 328 327 - 319 313.8  BMI 56.27 55.91 54.58 56.1 - 54.73 53.84  Some encounter information is confidential and restricted. Go to Review Flowsheets activity to see all data.       Leg pain, bilateral Markedly improved  Leg pain, left Chronic pain management changed from opioid to ibuprofen, needs to get labs drawn today  Morbid obesity Deteriorated. Patient re-educated about  the importance of commitment to a  minimum of 150 minutes of exercise per week.  The importance of healthy food choices with portion control discussed. Encouraged to start a food diary, count calories and to consider  joining a support group. Sample diet sheets offered. Goals set by the patient for the next several months.   Weight /BMI 02/04/2016 09/30/2015 07/08/2015  WEIGHT 338 lb 1.9 oz 336 lb 328 lb  HEIGHT 5\' 5"  5\' 5"  5'  5"  BMI 56.27 kg/m2 55.91 kg/m2 54.58 kg/m2  Some encounter information is confidential and restricted. Go to Review Flowsheets activity to see all data.      Dyslipidemia Hyperlipidemia:Low fat diet discussed and encouraged.   Lipid Panel  Lab Results  Component Value Date   CHOL 205 (H) 04/28/2015   HDL 31 (L) 04/28/2015   LDLCALC 133 (H) 04/28/2015   TRIG 203 (H) 04/28/2015   CHOLHDL 6.6  (H) 04/28/2015   Updated lab needed at/ before next visit.     OSA (obstructive sleep apnea) importance of daily use of equipment is discussed and stressed   Metabolic syndrome X The increased risk of cardiovascular disease associated with this diagnosis, and the need to consistently work on lifestyle to change this is discussed. Following  a  heart healthy diet ,commitment to 30 minutes of exercise at least 5 days per week, as well as control of blood sugar and cholesterol , and achieving a healthy weight are all the areas to be addressed .   Prediabetes Patient educated about the importance of limiting  Carbohydrate intake , the need to commit to daily physical activity for a minimum of 30 minutes , and to commit weight loss. The fact that changes in all these areas will reduce or eliminate all together the development of diabetes is stressed.   Diabetic Labs Latest Ref Rng & Units 06/14/2015 04/28/2015 04/07/2015 02/11/2015 01/26/2015  HbA1c <5.7 % - 6.3(H) - - -  Chol 125 - 200 mg/dL - 205(H) - - -  HDL >=40 mg/dL - 31(L) - - -  Calc LDL <130 mg/dL - 133(H) - - -  Triglycerides <150 mg/dL - 203(H) - - -  Creatinine 0.61 - 1.24 mg/dL 0.77 0.93 0.77 0.75 0.88   BP/Weight 02/04/2016 09/30/2015 07/08/2015 06/29/2015 06/14/2015 04/28/2015 99991111  Systolic BP 123XX123 123XX123 123456 [id number] 0000000 Q000111Q A999333  Diastolic BP 82 70 64 86 93 86 83  Wt. (Lbs) 338.12 336 328 327 - 319 313.8  BMI 56.27 55.91 54.58 56.1 - 54.73 53.84  Some encounter information is confidential and restricted. Go to Review Flowsheets activity to see all data.   No flowsheet data found.

## 2016-02-05 ENCOUNTER — Encounter: Payer: Self-pay | Admitting: Adult Health

## 2016-02-05 ENCOUNTER — Ambulatory Visit (INDEPENDENT_AMBULATORY_CARE_PROVIDER_SITE_OTHER): Payer: Medicaid Other | Admitting: Adult Health

## 2016-02-05 VITALS — BP 118/76 | HR 99 | Ht 65.0 in | Wt 339.0 lb

## 2016-02-05 DIAGNOSIS — I10 Essential (primary) hypertension: Secondary | ICD-10-CM | POA: Diagnosis not present

## 2016-02-05 DIAGNOSIS — R0609 Other forms of dyspnea: Secondary | ICD-10-CM

## 2016-02-05 DIAGNOSIS — R0600 Dyspnea, unspecified: Secondary | ICD-10-CM

## 2016-02-05 NOTE — Progress Notes (Signed)
Name: Shawn Ramsey    DOB: August 31, 1980  Age: 35 y.o.  MR#: [id number]       PCP:  Tula Nakayama, MD      Insurance: Payor: MEDICAID [place] / Plan: MEDICAID [place] ACCESS / Product Type: *No Product type* /   CC:    Chief Complaint  Patient presents with  . Edema    VS Vitals:   02/05/16 1253  BP: 118/76  Pulse: 99  SpO2: 92%  Weight: (!) 339 lb (153.8 kg)  Height: 5\' 5"  (1.651 m)    Weights Current Weight  02/05/16 (!) 339 lb (153.8 kg)  02/04/16 (!) 338 lb 1.9 oz (153.4 kg)  09/30/15 (!) 336 lb (152.4 kg)    Blood Pressure  BP Readings from Last 3 Encounters:  02/05/16 118/76  02/04/16 122/82  09/30/15 126/70     Admit date:  (Not on file) Last encounter with RMR:  Visit date not found   Allergy Patient has no known allergies.  Current Outpatient Prescriptions  Medication Sig Dispense Refill  . acetaminophen (TYLENOL) 325 MG tablet Take 650 mg by mouth every 6 (six) hours as needed for mild pain or moderate pain.    [name] albuterol (PROVENTIL HFA;VENTOLIN HFA) 108 (90 BASE) MCG/ACT inhaler Inhale 2 puffs into the lungs every 6 (six) hours as needed for wheezing. 1 Inhaler 5  . amLODipine (NORVASC) 5 MG tablet Take 1 tablet (5 mg total) by mouth daily. 30 tablet 3  . atorvastatin (LIPITOR) 20 MG tablet Take 1 tablet (20 mg total) by mouth daily. 30 tablet 5  . azelastine (ASTELIN) 0.1 % nasal spray Place 2 sprays into both nostrils 2 (two) times daily. Use in each nostril as directed 30 mL 12  . fluticasone (FLONASE) 50 MCG/ACT nasal spray Place 2 sprays into both nostrils daily. (Patient taking differently: Place 2 sprays into both nostrils daily as needed for rhinitis. ) 16 g 6  . metFORMIN (GLUCOPHAGE-XR) 500 MG 24 hr tablet TAKE ONE TABLET BY MOUTH ONCE DAILY WITH BREAKFAST 30 tablet 5  . mometasone-formoterol (DULERA) 100-5 MCG/ACT AERO Inhale 2 puffs into the lungs 2 (two) times daily.    . montelukast (SINGULAIR) 10 MG tablet Take 1 tablet (10 mg total) by mouth  at bedtime. 30 tablet 3  . potassium chloride SA (K-DUR,KLOR-CON) 20 MEQ tablet Take 1 tablet (20 mEq total) by mouth 3 (three) times daily. 90 tablet 0  . torsemide (DEMADEX) 20 MG tablet Take 20 mg by mouth daily.     No current facility-administered medications for this visit.     Discontinued Meds:   There are no discontinued medications.  Patient Active Problem List   Diagnosis Date Noted  . Essential hypertension 02/04/2016  . Chronic venous insufficiency 04/17/2015  . Edema 01/28/2015  . Leg pain, left 11/03/2014  . Prediabetes 11/03/2014  . Dyslipidemia 11/03/2014  . Other chest pain 06/26/2014  . OSA (obstructive sleep apnea) 03/15/2014  . Hypokalemia 03/15/2014  . Injury to superficial femoral artery 03/15/2014  . Tibial plateau fracture, left 03/15/2014  . Metabolic syndrome X [id number]  . Allergic sinusitis 09/13/2012  . NICOTINE ADDICTION 09/21/2008  . Morbid obesity ([place]) 09/08/2008  . Psychosis 09/08/2008  . COUGH VARIANT ASTHMA 09/08/2008    LABS    Component Value Date/Time   NA 141 06/14/2015 1105   NA 138 04/28/2015 1028   NA 141 04/07/2015 1416   K 3.9 06/14/2015 1105   K 3.9 04/28/2015 1028   K  3.8 04/07/2015 1416   CL 104 06/14/2015 1105   CL 96 (L) 04/28/2015 1028   CL 104 04/07/2015 1416   CO2 27 06/14/2015 1105   CO2 32 (H) 04/28/2015 1028   CO2 29 04/07/2015 1416   GLUCOSE 132 (H) 06/14/2015 1105   GLUCOSE 106 (H) 04/28/2015 1028   GLUCOSE 155 (H) 04/07/2015 1416   BUN 18 06/14/2015 1105   BUN 13 04/28/2015 1028   BUN 14 04/07/2015 1416   CREATININE 0.77 06/14/2015 1105   CREATININE 0.93 04/28/2015 1028   CREATININE 0.77 04/07/2015 1416   CREATININE 0.75 02/11/2015 2216   CREATININE 0.74 09/08/2014 1432   CREATININE 0.66 05/29/2014 1351   CALCIUM 9.2 06/14/2015 1105   CALCIUM 9.2 04/28/2015 1028   CALCIUM 8.8 (L) 04/07/2015 1416   GFRNONAA >60 06/14/2015 1105   GFRNONAA >89 04/28/2015 1028   GFRNONAA >60 04/07/2015 1416    GFRNONAA >60 02/11/2015 2216   GFRNONAA >89 09/08/2014 1432   GFRAA >60 06/14/2015 1105   GFRAA >89 04/28/2015 1028   GFRAA >60 04/07/2015 1416   GFRAA >60 02/11/2015 2216   GFRAA >89 09/08/2014 1432   CMP     Component Value Date/Time   NA 141 06/14/2015 1105   K 3.9 06/14/2015 1105   CL 104 06/14/2015 1105   CO2 27 06/14/2015 1105   GLUCOSE 132 (H) 06/14/2015 1105   BUN 18 06/14/2015 1105   CREATININE 0.77 06/14/2015 1105   CREATININE 0.93 04/28/2015 1028   CALCIUM 9.2 06/14/2015 1105   PROT 7.7 04/28/2015 1028   ALBUMIN 4.4 04/28/2015 1028   AST 17 04/28/2015 1028   ALT 23 04/28/2015 1028   ALKPHOS 72 04/28/2015 1028   BILITOT 0.2 04/28/2015 1028   GFRNONAA >60 06/14/2015 1105   GFRNONAA >89 04/28/2015 1028   GFRAA >60 06/14/2015 1105   GFRAA >89 04/28/2015 1028       Component Value Date/Time   WBC 6.9 06/14/2015 1105   WBC 6.5 04/28/2015 1028   WBC 6.7 04/07/2015 1416   HGB 12.0 (L) 06/14/2015 1105   HGB 12.7 (L) 04/28/2015 1028   HGB 11.1 (L) 04/07/2015 1416   HCT 37.2 (L) 06/14/2015 1105   HCT 39.3 04/28/2015 1028   HCT 34.4 (L) 04/07/2015 1416   MCV 86.1 06/14/2015 1105   MCV 84.9 04/28/2015 1028   MCV 89.1 04/07/2015 1416    Lipid Panel     Component Value Date/Time   CHOL 205 (H) 04/28/2015 1028   TRIG 203 (H) 04/28/2015 1028   HDL 31 (L) 04/28/2015 1028   CHOLHDL 6.6 (H) 04/28/2015 1028   VLDL 41 (H) 04/28/2015 1028   LDLCALC 133 (H) 04/28/2015 1028    ABG    Component Value Date/Time   PHART 7.405 03/11/2014 0848   PCO2ART 44.2 03/11/2014 0848   PO2ART 81.0 03/11/2014 0848   HCO3 27.8 (H) 03/11/2014 0848   TCO2 29 03/11/2014 0848   ACIDBASEDEF 1.8 03/07/2014 0500   O2SAT 96.0 03/11/2014 0848     Lab Results  Component Value Date   TSH 3.04 04/28/2015   BNP (last 3 results)  Recent Labs  04/07/15 1416  BNP 16.0    ProBNP (last 3 results) No results for input(s): PROBNP in the last 8760 hours.  Cardiac Panel (last 3  results) No results for input(s): CKTOTAL, CKMB, TROPONINI, RELINDX in the last 72 hours.  Iron/TIBC/Ferritin/ %Sat No results found for: IRON, TIBC, FERRITIN, IRONPCTSAT   EKG Orders placed or performed during  the hospital encounter of 06/14/15  . ED EKG within 10 minutes  . ED EKG within 10 minutes  . EKG 12-Lead  . EKG 12-Lead     Prior Assessment and Plan Problem List as of 02/05/2016 Reviewed: 02/04/2016  6:05 PM by Tula Nakayama, MD     Cardiovascular and Mediastinum   Injury to superficial femoral artery   Chronic venous insufficiency   Last Assessment & Plan 09/30/2015 Office Visit Written 10/04/2015  9:24 PM by Fayrene Helper, MD    Improved leg swelling, pt to continue fluid restriction and use of CPAP      Essential hypertension   Last Assessment & Plan 02/04/2016 Office Visit Written 02/04/2016  6:06 PM by Fayrene Helper, MD    Controlled, no change in medication DASH diet and commitment to daily physical activity for a minimum of 30 minutes discussed and encouraged, as a part of hypertension management. The importance of attaining a healthy weight is also discussed.  BP/Weight 02/04/2016 09/30/2015 07/08/2015 06/29/2015 06/14/2015 04/28/2015 99991111  Systolic BP 123XX123 123XX123 [id number] [id number]  Diastolic BP 82 70 64 86 93 86 83  Wt. (Lbs) 338.12 336 328 327 - 319 313.8  BMI 56.27 55.91 54.58 56.1 - 54.73 53.84  Some encounter information is confidential and restricted. Go to Review Flowsheets activity to see all data.             Respiratory   OSA (obstructive sleep apnea)   Last Assessment & Plan 02/04/2016 Office Visit Written 02/04/2016  6:11 PM by Fayrene Helper, MD    importance of daily use of equipment is discussed and stressed       [place] 09/30/2015 Office Visit Written 10/04/2015  9:29 PM by Fayrene Helper, MD    Controlled and managed by pulmonary      Allergic sinusitis   Last Assessment & Plan  06/29/2015 Office Visit Written 07/05/2015  5:35 AM by Fayrene Helper, MD    Increased symptoms, currently uncontrolled , add flonase        Musculoskeletal and Integument   Tibial plateau fracture, left     Other   Morbid obesity [facility])   Last Assessment & Plan 02/04/2016 Office Visit Written 02/04/2016  6:07 PM by Fayrene Helper, MD    Deteriorated. Patient re-educated about  the importance of commitment to a  minimum of 150 minutes of exercise per week.  The importance of healthy food choices with portion control discussed. Encouraged to start a food diary, count calories and to consider  joining a support group. Sample diet sheets offered. Goals set by the patient for the next several months.   Weight /BMI 02/04/2016 09/30/2015 07/08/2015  WEIGHT 338 lb 1.9 oz 336 lb 328 lb  HEIGHT 5\' 5"  5\' 5"  5\' 5"   BMI 56.27 kg/m2 55.91 kg/m2 54.58 kg/m2  Some encounter information is confidential and restricted. Go to Review Flowsheets activity to see all data.          Psychosis   Last Assessment & Plan 01/28/2015 Office Visit Written 02/23/2015  3:50 PM by Fayrene Helper, MD    Stable , treated by psychiatry      NICOTINE ADDICTION   Last Assessment & Plan 09/30/2015 Office Visit Written 10/04/2015  9:28 PM by Fayrene Helper, MD    Patient counseled for approximately 5 minutes regarding the health risks of ongoing nicotine use, specifically all types  of cancer, heart disease, stroke and respiratory failure. The options available for help with cessation ,the behavioral changes to assist the process, and the option to either gradully reduce usage  Or abruptly stop.is also discussed. Pt is also encouraged to set specific goals in number of cigarettes used daily, as well as to set a quit date.         Metabolic syndrome X   Last Assessment & Plan 02/04/2016 Office Visit Written 02/04/2016  6:11 PM by Fayrene Helper, MD    The increased risk of cardiovascular disease associated  with this diagnosis, and the need to consistently work on lifestyle to change this is discussed. Following  a  heart healthy diet ,commitment to 30 minutes of exercise at least 5 days per week, as well as control of blood sugar and cholesterol , and achieving a healthy weight are all the areas to be addressed .       Hypokalemia   Other chest pain   Last Assessment & Plan 06/26/2014 Office Visit Edited 07/05/2014 10:46 AM by Fayrene Helper, MD    New onset left chest pain, nop specific aggravating or relieving factors noted,   EKG in office has non specific abnormalities, possible st elevation in anterior leads he does have increased risk, based on nicotine use and metabolic syndrome, cardiology referral       Leg pain, left   Last Assessment & Plan 02/04/2016 Office Visit Written 02/04/2016  6:07 PM by Fayrene Helper, MD    Chronic pain management changed from opioid to ibuprofen, needs to get labs drawn today      Prediabetes   Last Assessment & Plan 02/04/2016 Office Visit Written 02/04/2016  6:12 PM by Fayrene Helper, MD    Patient educated about the importance of limiting  Carbohydrate intake , the need to commit to daily physical activity for a minimum of 30 minutes , and to commit weight loss. The fact that changes in all these areas will reduce or eliminate all together the development of diabetes is stressed.   Diabetic Labs Latest Ref Rng & Units 06/14/2015 04/28/2015 04/07/2015 02/11/2015 01/26/2015  HbA1c <5.7 % - 6.3(H) - - -  Chol 125 - 200 mg/dL - 205(H) - - -  HDL >=40 mg/dL - 31(L) - - -  Calc LDL <130 mg/dL - 133(H) - - -  Triglycerides <150 mg/dL - 203(H) - - -  Creatinine 0.61 - 1.24 mg/dL 0.77 0.93 0.77 0.75 0.88   BP/Weight 02/04/2016 09/30/2015 07/08/2015 06/29/2015 06/14/2015 04/28/2015 99991111  Systolic BP 123XX123 123XX123 123456 [id number] 0000000 Q000111Q [id number]  Diastolic BP 82 70 64 86 93 86 83  Wt. (Lbs) 338.12 336 328 327 - 319 313.8  BMI 56.27 55.91 54.58 56.1 - 54.73 53.84  Some  encounter information is confidential and restricted. Go to Review Flowsheets activity to see all data.   No flowsheet data found.        Dyslipidemia   Last Assessment & Plan 02/04/2016 Office Visit Written 02/04/2016  6:08 PM by Fayrene Helper, MD    Hyperlipidemia:Low fat diet discussed and encouraged.   Lipid Panel  Lab Results  Component Value Date   CHOL 205 (H) 04/28/2015   HDL 31 (L) 04/28/2015   LDLCALC 133 (H) 04/28/2015   TRIG 203 (H) 04/28/2015   CHOLHDL 6.6 (H) 04/28/2015   Updated lab needed at/ before next visit.         Edema   Last Assessment &  Plan 04/28/2015 Office Visit Written 06/08/2015  7:31 AM by Fayrene Helper, MD    Lasix and potassium to be continued, leg elevation a;lso recommended and reduced sodium intake          Imaging: No results found.
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
## 2016-02-05 NOTE — Patient Instructions (Signed)
Your physician wants you to follow-up in: 1 Year with Dr. McDowell. You will receive a reminder letter in the mail two months in advance. If you don't receive a letter, please call our office to schedule the follow-up appointment.  Your physician recommends that you continue on your current medications as directed. Please refer to the Current Medication list given to you today.  If you need a refill on your cardiac medications before your next appointment, please call your pharmacy.  Thank you for choosing [facility]!   

## 2016-02-06 LAB — COMPLETE METABOLIC PANEL WITH GFR
ALT: 16 U/L (ref 9–46)
AST: 14 U/L (ref 10–40)
Albumin: 4.1 g/dL (ref 3.6–5.1)
Alkaline Phosphatase: 75 U/L (ref 40–115)
BUN: 13 mg/dL (ref 7–25)
CO2: 33 mmol/L — ABNORMAL HIGH (ref 20–31)
Calcium: 8.9 mg/dL (ref 8.6–10.3)
Chloride: 102 mmol/L (ref 98–110)
Creat: 0.65 mg/dL (ref 0.60–1.35)
GFR, Est African American: >89 mL/min (ref >=60)
GFR, Est Non African American: >89 mL/min (ref >=60)
Glucose, Bld: 72 mg/dL (ref 65–99)
Potassium: 4.4 mmol/L (ref 3.5–5.3)
Sodium: 141 mmol/L (ref 135–146)
Total Bilirubin: 0.3 mg/dL (ref 0.2–1.2)
Total Protein: 7.3 g/dL (ref 6.1–8.1)

## 2016-02-06 LAB — LIPID PANEL
Cholesterol: 166 mg/dL (ref <200)
HDL: 30 mg/dL — ABNORMAL LOW (ref >40)
LDL Cholesterol: 114 mg/dL — ABNORMAL HIGH (ref <100)
Total CHOL/HDL Ratio: 5.5 Ratio — ABNORMAL HIGH (ref <5.0)
Triglycerides: 111 mg/dL (ref <150)
VLDL: 22 mg/dL (ref <30)

## 2016-02-06 LAB — HEMOGLOBIN A1C
Hgb A1c MFr Bld: 6.5 % — ABNORMAL HIGH (ref <5.7)
Mean Plasma Glucose: 140 mg/dL

## 2016-02-11 ENCOUNTER — Other Ambulatory Visit: Payer: Self-pay

## 2016-02-11 DIAGNOSIS — E785 Hyperlipidemia, unspecified: Secondary | ICD-10-CM

## 2016-02-11 MED ORDER — ATORVASTATIN CALCIUM 40 MG PO TABS: 40.0000 mg | ORAL_TABLET | Freq: Every day | ORAL | 4 refills | Status: DC

## 2016-02-19 ENCOUNTER — Telehealth: Payer: Self-pay

## 2016-02-19 DIAGNOSIS — E785 Hyperlipidemia, unspecified: Secondary | ICD-10-CM

## 2016-02-19 DIAGNOSIS — I10 Essential (primary) hypertension: Secondary | ICD-10-CM

## 2016-02-19 DIAGNOSIS — R7303 Prediabetes: Secondary | ICD-10-CM

## 2016-02-19 DIAGNOSIS — E559 Vitamin D deficiency, unspecified: Secondary | ICD-10-CM

## 2016-02-19 NOTE — Telephone Encounter (Signed)
Labs prior to next appt

## 2016-03-23 ENCOUNTER — Other Ambulatory Visit: Payer: Self-pay

## 2016-03-24 ENCOUNTER — Other Ambulatory Visit: Payer: Self-pay

## 2016-03-24 MED ORDER — IBUPROFEN 800 MG PO TABS: 800.0000 mg | ORAL_TABLET | Freq: Every day | ORAL | 3 refills | Status: DC

## 2016-04-27 ENCOUNTER — Ambulatory Visit: Payer: Medicaid Other | Admitting: Family Medicine

## 2016-04-27 ENCOUNTER — Encounter (HOSPITAL_COMMUNITY): Payer: Self-pay | Admitting: *Deleted

## 2016-04-27 ENCOUNTER — Emergency Department (HOSPITAL_COMMUNITY): Payer: Medicaid Other

## 2016-04-27 ENCOUNTER — Inpatient Hospital Stay (HOSPITAL_COMMUNITY)
Admission: EM | Admit: 2016-04-27 | Discharge: 2016-04-29 | DRG: 190 | Disposition: A | Discharge status: Home / Self Care | Payer: Medicaid Other | Attending: Internal Medicine | Admitting: Internal Medicine

## 2016-04-27 DIAGNOSIS — Z7984 Long term (current) use of oral hypoglycemic drugs: Secondary | ICD-10-CM

## 2016-04-27 DIAGNOSIS — Z6841 Body Mass Index (BMI) 40.0 and over, adult: Secondary | ICD-10-CM

## 2016-04-27 DIAGNOSIS — F819 Developmental disorder of scholastic skills, unspecified: Secondary | ICD-10-CM | POA: Diagnosis present

## 2016-04-27 DIAGNOSIS — Z9111 Patient's noncompliance with dietary regimen: Secondary | ICD-10-CM

## 2016-04-27 DIAGNOSIS — R0789 Other chest pain: Secondary | ICD-10-CM | POA: Diagnosis present

## 2016-04-27 DIAGNOSIS — F17210 Nicotine dependence, cigarettes, uncomplicated: Secondary | ICD-10-CM | POA: Diagnosis present

## 2016-04-27 DIAGNOSIS — Z823 Family history of stroke: Secondary | ICD-10-CM

## 2016-04-27 DIAGNOSIS — Z9114 Patient's other noncompliance with medication regimen: Secondary | ICD-10-CM

## 2016-04-27 DIAGNOSIS — R300 Dysuria: Secondary | ICD-10-CM | POA: Diagnosis present

## 2016-04-27 DIAGNOSIS — E662 Morbid (severe) obesity with alveolar hypoventilation: Secondary | ICD-10-CM | POA: Diagnosis present

## 2016-04-27 DIAGNOSIS — J9602 Acute respiratory failure with hypercapnia: Secondary | ICD-10-CM | POA: Diagnosis present

## 2016-04-27 DIAGNOSIS — I10 Essential (primary) hypertension: Secondary | ICD-10-CM | POA: Diagnosis present

## 2016-04-27 DIAGNOSIS — E785 Hyperlipidemia, unspecified: Secondary | ICD-10-CM | POA: Diagnosis present

## 2016-04-27 DIAGNOSIS — J441 Chronic obstructive pulmonary disease with (acute) exacerbation: Principal | ICD-10-CM | POA: Diagnosis present

## 2016-04-27 DIAGNOSIS — Z791 Long term (current) use of non-steroidal anti-inflammatories (NSAID): Secondary | ICD-10-CM

## 2016-04-27 DIAGNOSIS — Z9981 Dependence on supplemental oxygen: Secondary | ICD-10-CM

## 2016-04-27 DIAGNOSIS — Z79899 Other long term (current) drug therapy: Secondary | ICD-10-CM

## 2016-04-27 DIAGNOSIS — R600 Localized edema: Secondary | ICD-10-CM | POA: Diagnosis present

## 2016-04-27 DIAGNOSIS — E119 Type 2 diabetes mellitus without complications: Secondary | ICD-10-CM | POA: Diagnosis present

## 2016-04-27 DIAGNOSIS — J9622 Acute and chronic respiratory failure with hypercapnia: Secondary | ICD-10-CM | POA: Diagnosis present

## 2016-04-27 DIAGNOSIS — J9621 Acute and chronic respiratory failure with hypoxia: Secondary | ICD-10-CM | POA: Diagnosis present

## 2016-04-27 DIAGNOSIS — G4733 Obstructive sleep apnea (adult) (pediatric): Secondary | ICD-10-CM | POA: Diagnosis present

## 2016-04-27 DIAGNOSIS — Z7951 Long term (current) use of inhaled steroids: Secondary | ICD-10-CM

## 2016-04-27 DIAGNOSIS — Z8249 Family history of ischemic heart disease and other diseases of the circulatory system: Secondary | ICD-10-CM

## 2016-04-27 DIAGNOSIS — Z9119 Patient's noncompliance with other medical treatment and regimen: Secondary | ICD-10-CM

## 2016-04-27 DIAGNOSIS — J45991 Cough variant asthma: Secondary | ICD-10-CM

## 2016-04-27 LAB — CBC WITH DIFFERENTIAL/PLATELET
Basophils Absolute: 0 10*3/uL (ref 0.0–0.1)
Basophils Relative: 0 %
Eosinophils Absolute: 0.2 10*3/uL (ref 0.0–0.7)
Eosinophils Relative: 2 %
HCT: 41 % (ref 39.0–52.0)
Hemoglobin: 12.8 g/dL — ABNORMAL LOW (ref 13.0–17.0)
Lymphocytes Relative: 28 %
Lymphs Abs: 2.4 10*3/uL (ref 0.7–4.0)
MCH: 27.2 pg (ref 26.0–34.0)
MCHC: 31.2 g/dL (ref 30.0–36.0)
MCV: 87 fL (ref 78.0–100.0)
Monocytes Absolute: 0.6 10*3/uL (ref 0.1–1.0)
Monocytes Relative: 7 %
Neutro Abs: 5.6 10*3/uL (ref 1.7–7.7)
Neutrophils Relative %: 63 %
Platelets: 327 10*3/uL (ref 150–400)
RBC: 4.71 MIL/uL (ref 4.22–5.81)
RDW: 15.8 % — ABNORMAL HIGH (ref 11.5–15.5)
WBC: 8.8 10*3/uL (ref 4.0–10.5)

## 2016-04-27 LAB — TROPONIN I: Troponin I: <0.03 ng/mL (ref <0.03)

## 2016-04-27 LAB — BASIC METABOLIC PANEL
Anion gap: 7 (ref 5–15)
BUN: 16 mg/dL (ref 6–20)
CO2: 30 mmol/L (ref 22–32)
Calcium: 9.3 mg/dL (ref 8.9–10.3)
Chloride: 102 mmol/L (ref 101–111)
Creatinine, Ser: 0.79 mg/dL (ref 0.61–1.24)
GFR calc Af Amer: >60 mL/min (ref >60)
GFR calc non Af Amer: >60 mL/min (ref >60)
Glucose, Bld: 131 mg/dL — ABNORMAL HIGH (ref 65–99)
Potassium: 3.9 mmol/L (ref 3.5–5.1)
Sodium: 139 mmol/L (ref 135–145)

## 2016-04-27 LAB — BRAIN NATRIURETIC PEPTIDE: B Natriuretic Peptide: 20 pg/mL (ref 0.0–100.0)

## 2016-04-27 LAB — D-DIMER, QUANTITATIVE: D-Dimer, Quant: 0.5 ug/mL-FEU (ref 0.00–0.50)

## 2016-04-27 MED ORDER — ALBUTEROL SULFATE (2.5 MG/3ML) 0.083% IN NEBU: 5.0000 mg | INHALATION_SOLUTION | Freq: Once | RESPIRATORY_TRACT | Status: DC

## 2016-04-27 NOTE — ED Notes (Signed)
ED Provider at bedside. 
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
## 2016-04-27 NOTE — ED Triage Notes (Signed)
Pt c/o sob and some chest pain for the last couple of days

## 2016-04-28 DIAGNOSIS — J9601 Acute respiratory failure with hypoxia: Secondary | ICD-10-CM

## 2016-04-28 DIAGNOSIS — R600 Localized edema: Secondary | ICD-10-CM | POA: Diagnosis present

## 2016-04-28 DIAGNOSIS — E119 Type 2 diabetes mellitus without complications: Secondary | ICD-10-CM | POA: Diagnosis present

## 2016-04-28 DIAGNOSIS — Z7951 Long term (current) use of inhaled steroids: Secondary | ICD-10-CM | POA: Diagnosis not present

## 2016-04-28 DIAGNOSIS — J9621 Acute and chronic respiratory failure with hypoxia: Secondary | ICD-10-CM | POA: Diagnosis present

## 2016-04-28 DIAGNOSIS — J441 Chronic obstructive pulmonary disease with (acute) exacerbation: Secondary | ICD-10-CM | POA: Diagnosis present

## 2016-04-28 DIAGNOSIS — Z7984 Long term (current) use of oral hypoglycemic drugs: Secondary | ICD-10-CM | POA: Diagnosis not present

## 2016-04-28 DIAGNOSIS — F17210 Nicotine dependence, cigarettes, uncomplicated: Secondary | ICD-10-CM | POA: Diagnosis present

## 2016-04-28 DIAGNOSIS — Z79899 Other long term (current) drug therapy: Secondary | ICD-10-CM | POA: Diagnosis not present

## 2016-04-28 DIAGNOSIS — F819 Developmental disorder of scholastic skills, unspecified: Secondary | ICD-10-CM | POA: Diagnosis present

## 2016-04-28 DIAGNOSIS — R0789 Other chest pain: Secondary | ICD-10-CM

## 2016-04-28 DIAGNOSIS — E785 Hyperlipidemia, unspecified: Secondary | ICD-10-CM | POA: Diagnosis present

## 2016-04-28 DIAGNOSIS — E662 Morbid (severe) obesity with alveolar hypoventilation: Secondary | ICD-10-CM | POA: Diagnosis present

## 2016-04-28 DIAGNOSIS — Z791 Long term (current) use of non-steroidal anti-inflammatories (NSAID): Secondary | ICD-10-CM | POA: Diagnosis not present

## 2016-04-28 DIAGNOSIS — J9622 Acute and chronic respiratory failure with hypercapnia: Secondary | ICD-10-CM

## 2016-04-28 DIAGNOSIS — Z9119 Patient's noncompliance with other medical treatment and regimen: Secondary | ICD-10-CM | POA: Diagnosis not present

## 2016-04-28 DIAGNOSIS — Z9111 Patient's noncompliance with dietary regimen: Secondary | ICD-10-CM | POA: Diagnosis not present

## 2016-04-28 DIAGNOSIS — Z8249 Family history of ischemic heart disease and other diseases of the circulatory system: Secondary | ICD-10-CM | POA: Diagnosis not present

## 2016-04-28 DIAGNOSIS — I10 Essential (primary) hypertension: Secondary | ICD-10-CM | POA: Diagnosis present

## 2016-04-28 DIAGNOSIS — J9602 Acute respiratory failure with hypercapnia: Secondary | ICD-10-CM

## 2016-04-28 DIAGNOSIS — Z6841 Body Mass Index (BMI) 40.0 and over, adult: Secondary | ICD-10-CM | POA: Diagnosis not present

## 2016-04-28 DIAGNOSIS — Z9114 Patient's other noncompliance with medication regimen: Secondary | ICD-10-CM | POA: Diagnosis not present

## 2016-04-28 DIAGNOSIS — Z823 Family history of stroke: Secondary | ICD-10-CM | POA: Diagnosis not present

## 2016-04-28 DIAGNOSIS — R300 Dysuria: Secondary | ICD-10-CM | POA: Diagnosis present

## 2016-04-28 DIAGNOSIS — Z9981 Dependence on supplemental oxygen: Secondary | ICD-10-CM | POA: Diagnosis not present

## 2016-04-28 DIAGNOSIS — G4733 Obstructive sleep apnea (adult) (pediatric): Secondary | ICD-10-CM | POA: Diagnosis present

## 2016-04-28 LAB — BLOOD GAS, ARTERIAL
Acid-Base Excess: 5.6 mmol/L — ABNORMAL HIGH (ref 0.0–2.0)
Acid-Base Excess: 3.7 mmol/L — ABNORMAL HIGH (ref 0.0–2.0)
Allens test (pass/fail): POSITIVE — AB
Bicarbonate: 27.2 mmol/L (ref 20.0–28.0)
Bicarbonate: 25.4 mmol/L (ref 20.0–28.0)
Delivery systems: POSITIVE
Drawn by: 21310
Drawn by: 382351
Expiratory PAP: 6
FIO2: 50
Inspiratory PAP: 16
O2 Saturation: 57.4 %
O2 Saturation: 96.8 %
Patient temperature: 37
Patient temperature: 37
pCO2 arterial: 61.4 mmHg — ABNORMAL HIGH (ref 32.0–48.0)
pCO2 arterial: 77.3 mmHg (ref 32.0–48.0)
pH, Arterial: 7.328 — ABNORMAL LOW (ref 7.350–7.450)
pH, Arterial: 7.225 — ABNORMAL LOW (ref 7.350–7.450)
pO2, Arterial: 34.6 mmHg — CL (ref 83.0–108.0)
pO2, Arterial: 118 mmHg — ABNORMAL HIGH (ref 83.0–108.0)

## 2016-04-28 LAB — COMPREHENSIVE METABOLIC PANEL
ALT: 21 U/L (ref 17–63)
AST: 16 U/L (ref 15–41)
Albumin: 4.1 g/dL (ref 3.5–5.0)
Alkaline Phosphatase: 69 U/L (ref 38–126)
Anion gap: 7 (ref 5–15)
BUN: 17 mg/dL (ref 6–20)
CO2: 30 mmol/L (ref 22–32)
Calcium: 9 mg/dL (ref 8.9–10.3)
Chloride: 100 mmol/L — ABNORMAL LOW (ref 101–111)
Creatinine, Ser: 0.8 mg/dL (ref 0.61–1.24)
GFR calc Af Amer: >60 mL/min (ref >60)
GFR calc non Af Amer: >60 mL/min (ref >60)
Glucose, Bld: 158 mg/dL — ABNORMAL HIGH (ref 65–99)
Potassium: 4 mmol/L (ref 3.5–5.1)
Sodium: 137 mmol/L (ref 135–145)
Total Bilirubin: 0.4 mg/dL (ref 0.3–1.2)
Total Protein: 7.9 g/dL (ref 6.5–8.1)

## 2016-04-28 LAB — GLUCOSE, CAPILLARY
Glucose-Capillary: 218 mg/dL — ABNORMAL HIGH (ref 65–99)
Glucose-Capillary: 280 mg/dL — ABNORMAL HIGH (ref 65–99)
Glucose-Capillary: 221 mg/dL — ABNORMAL HIGH (ref 65–99)
Glucose-Capillary: 414 mg/dL — ABNORMAL HIGH (ref 65–99)

## 2016-04-28 LAB — CBC
HCT: 40.9 % (ref 39.0–52.0)
Hemoglobin: 12.8 g/dL — ABNORMAL LOW (ref 13.0–17.0)
MCH: 27.4 pg (ref 26.0–34.0)
MCHC: 31.3 g/dL (ref 30.0–36.0)
MCV: 87.6 fL (ref 78.0–100.0)
Platelets: 324 10*3/uL (ref 150–400)
RBC: 4.67 MIL/uL (ref 4.22–5.81)
RDW: 15.9 % — ABNORMAL HIGH (ref 11.5–15.5)
WBC: 7.7 10*3/uL (ref 4.0–10.5)

## 2016-04-28 LAB — TROPONIN I
Troponin I: <0.03 ng/mL (ref <0.03)
Troponin I: <0.03 ng/mL (ref <0.03)
Troponin I: <0.03 ng/mL (ref <0.03)

## 2016-04-28 MED ORDER — ATORVASTATIN CALCIUM 40 MG PO TABS
40.0000 mg | ORAL_TABLET | Freq: Every day | ORAL | Status: DC
  Administered 2016-04-28 – 2016-04-29 (×2): 40 mg via ORAL
  Filled 2016-04-28 (×2): qty 1

## 2016-04-28 MED ORDER — IPRATROPIUM-ALBUTEROL 0.5-2.5 (3) MG/3ML IN SOLN
3.0000 mL | Freq: Four times a day (QID) | RESPIRATORY_TRACT | Status: DC
  Administered 2016-04-28 (×3): 3 mL via RESPIRATORY_TRACT
  Filled 2016-04-28 (×3): qty 3

## 2016-04-28 MED ORDER — ALBUTEROL SULFATE (2.5 MG/3ML) 0.083% IN NEBU: 2.5000 mg | INHALATION_SOLUTION | RESPIRATORY_TRACT | Status: DC | PRN

## 2016-04-28 MED ORDER — SODIUM CHLORIDE 0.9 % IV SOLN: 250.0000 mL | INTRAVENOUS | Status: DC | PRN

## 2016-04-28 MED ORDER — ONDANSETRON HCL 4 MG/2ML IJ SOLN: 4.0000 mg | Freq: Four times a day (QID) | INTRAMUSCULAR | Status: DC | PRN

## 2016-04-28 MED ORDER — TORSEMIDE 20 MG PO TABS
20.0000 mg | ORAL_TABLET | Freq: Two times a day (BID) | ORAL | Status: DC
  Administered 2016-04-28 – 2016-04-29 (×3): 20 mg via ORAL
  Filled 2016-04-28 (×3): qty 1

## 2016-04-28 MED ORDER — METHYLPREDNISOLONE SODIUM SUCC 125 MG IJ SOLR
60.0000 mg | Freq: Four times a day (QID) | INTRAMUSCULAR | Status: DC
  Administered 2016-04-28 – 2016-04-29 (×5): 60 mg via INTRAVENOUS
  Filled 2016-04-28 (×5): qty 2

## 2016-04-28 MED ORDER — IPRATROPIUM BROMIDE 0.02 % IN SOLN: 0.5000 mg | Freq: Four times a day (QID) | RESPIRATORY_TRACT | Status: DC

## 2016-04-28 MED ORDER — IPRATROPIUM-ALBUTEROL 0.5-2.5 (3) MG/3ML IN SOLN
3.0000 mL | Freq: Two times a day (BID) | RESPIRATORY_TRACT | Status: DC
  Administered 2016-04-29: 3 mL via RESPIRATORY_TRACT
  Filled 2016-04-28: qty 3

## 2016-04-28 MED ORDER — ACETAMINOPHEN 500 MG PO TABS
1000.0000 mg | ORAL_TABLET | Freq: Four times a day (QID) | ORAL | Status: DC | PRN
  Administered 2016-04-28: 1000 mg via ORAL
  Filled 2016-04-28: qty 2

## 2016-04-28 MED ORDER — GUAIFENESIN ER 600 MG PO TB12
600.0000 mg | ORAL_TABLET | Freq: Two times a day (BID) | ORAL | Status: DC
  Administered 2016-04-28 – 2016-04-29 (×3): 600 mg via ORAL
  Filled 2016-04-28 (×3): qty 1

## 2016-04-28 MED ORDER — SODIUM CHLORIDE 0.9% FLUSH: 3.0000 mL | INTRAVENOUS | Status: DC | PRN

## 2016-04-28 MED ORDER — POTASSIUM CHLORIDE CRYS ER 20 MEQ PO TBCR
20.0000 meq | EXTENDED_RELEASE_TABLET | Freq: Three times a day (TID) | ORAL | Status: DC
  Administered 2016-04-28 – 2016-04-29 (×4): 20 meq via ORAL
  Filled 2016-04-28 (×4): qty 1

## 2016-04-28 MED ORDER — INSULIN ASPART 100 UNIT/ML ~~LOC~~ SOLN
0.0000 [IU] | Freq: Three times a day (TID) | SUBCUTANEOUS | Status: DC
  Administered 2016-04-28: 5 [IU] via SUBCUTANEOUS
  Administered 2016-04-28 – 2016-04-29 (×3): 3 [IU] via SUBCUTANEOUS
  Administered 2016-04-29: 7 [IU] via SUBCUTANEOUS

## 2016-04-28 MED ORDER — SODIUM CHLORIDE 0.9% FLUSH
3.0000 mL | Freq: Two times a day (BID) | INTRAVENOUS | Status: DC
  Administered 2016-04-28 – 2016-04-29 (×3): 3 mL via INTRAVENOUS

## 2016-04-28 MED ORDER — ENOXAPARIN SODIUM 80 MG/0.8ML ~~LOC~~ SOLN
80.0000 mg | SUBCUTANEOUS | Status: DC
  Administered 2016-04-28 – 2016-04-29 (×2): 80 mg via SUBCUTANEOUS
  Filled 2016-04-28 (×2): qty 0.8

## 2016-04-28 MED ORDER — AMLODIPINE BESYLATE 5 MG PO TABS
5.0000 mg | ORAL_TABLET | Freq: Every day | ORAL | Status: DC
  Administered 2016-04-28 – 2016-04-29 (×2): 5 mg via ORAL
  Filled 2016-04-28 (×2): qty 1

## 2016-04-28 MED ORDER — IOPAMIDOL (ISOVUE-370) INJECTION 76%
100.0000 mL | Freq: Once | INTRAVENOUS | Status: AC | PRN
  Administered 2016-04-28: 100 mL via INTRAVENOUS

## 2016-04-28 MED ORDER — METHYLPREDNISOLONE SODIUM SUCC 125 MG IJ SOLR
125.0000 mg | Freq: Once | INTRAMUSCULAR | Status: AC
  Administered 2016-04-28: 125 mg via INTRAVENOUS
  Filled 2016-04-28: qty 2

## 2016-04-28 MED ORDER — IPRATROPIUM-ALBUTEROL 0.5-2.5 (3) MG/3ML IN SOLN
3.0000 mL | Freq: Once | RESPIRATORY_TRACT | Status: AC
  Administered 2016-04-28: 3 mL via RESPIRATORY_TRACT
  Filled 2016-04-28: qty 3

## 2016-04-28 MED ORDER — ENOXAPARIN SODIUM 40 MG/0.4ML ~~LOC~~ SOLN: 40.0000 mg | SUBCUTANEOUS | Status: DC

## 2016-04-28 MED ORDER — IPRATROPIUM-ALBUTEROL 0.5-2.5 (3) MG/3ML IN SOLN: 3.0000 mL | Freq: Four times a day (QID) | RESPIRATORY_TRACT | Status: DC

## 2016-04-28 MED ORDER — INSULIN ASPART 100 UNIT/ML ~~LOC~~ SOLN
10.0000 [IU] | Freq: Once | SUBCUTANEOUS | Status: AC
  Administered 2016-04-28: 10 [IU] via SUBCUTANEOUS

## 2016-04-28 MED ORDER — ONDANSETRON HCL 4 MG PO TABS: 4.0000 mg | ORAL_TABLET | Freq: Four times a day (QID) | ORAL | Status: DC | PRN

## 2016-04-28 MED ORDER — MONTELUKAST SODIUM 10 MG PO TABS
10.0000 mg | ORAL_TABLET | Freq: Every day | ORAL | Status: DC
  Administered 2016-04-28: 10 mg via ORAL
  Filled 2016-04-28: qty 1
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
## 2016-04-28 NOTE — Care Management Note (Signed)
Case Management Note  Patient Details  Name: Shawn Ramsey MRN: [id number] Date of Birth: 04-07-1980  Subjective/Objective: Patient adm with acute respiratory failure with hypoxia and hypercarbia. He lives with his mother and ind with ADL's.  Patient and mother state that he has oxygen, neb machine, and CPAP at home. Mother states that oxygen is provided by [facility]. Neb machine and CPAP are provided by [employer]. Patient does report noncompliance. He has PCP, mother drives him to appointments. He has Medicaid and reports no issues affording medications.               Action/Plan: Anticipate DC home with self care. No CM needs identified.    Expected Discharge Date:    04/30/2016              Expected Discharge Plan:  Home/Self Care  In-House Referral:     Discharge planning Services  CM Consult  Post Acute Care Choice:  NA Choice offered to:  NA  DME Arranged:    DME Agency:     HH Arranged:    HH Agency:     Status of Service:  In process, will continue to follow  If discussed at Long Length of Stay Meetings, dates discussed:    Additional Comments:  Shawn Ramsey, Chauncey Reading, RN 04/28/2016, 3:24 PM

## 2016-04-28 NOTE — ED Notes (Signed)
Ambulated pt around nurses station. Pt with increased work of breathing and c/o shortness of breath during ambulation. HR increased to 130's and saturations maintained 91-94% during ambulation. Pt returned to bed, updated on plans for CT at this time.

## 2016-04-28 NOTE — Progress Notes (Signed)
Patient seen and examined, database reviewed, no family members at bedside. Patient admitted earlier today due to shortness of breath. Has a history of morbid obesity, COPD, obstructive sleep apnea and prediabetes and is noncompliant with both diet as well as medications and also with C Pap use. CT of the chest without evidence for PE. Did require BiPAP transiently due to hypercapnic respiratory failure, has now been weaned off noninvasive positive pressure ventilation to nasal cannula oxygen. Is symptomatically improved, still has minimal wheezing on exam. Plan to continue steroids and nebulizer treatments today, can transfer to floor. Hopefully discharge home in 24-48 hours.  Domingo Mend, MD [facility] Pager: [phone]

## 2016-04-29 LAB — URINALYSIS, ROUTINE W REFLEX MICROSCOPIC
Bilirubin Urine: NEGATIVE
Glucose, UA: 150 mg/dL — AB
Hgb urine dipstick: NEGATIVE
Ketones, ur: NEGATIVE mg/dL
Leukocytes, UA: NEGATIVE
Nitrite: NEGATIVE
Protein, ur: NEGATIVE mg/dL
Specific Gravity, Urine: 1.009 (ref 1.005–1.030)
pH: 6 (ref 5.0–8.0)

## 2016-04-29 LAB — HEMOGLOBIN A1C
Hgb A1c MFr Bld: 6.6 % — ABNORMAL HIGH (ref 4.8–5.6)
Mean Plasma Glucose: 143 mg/dL

## 2016-04-29 LAB — GLUCOSE, CAPILLARY
Glucose-Capillary: 223 mg/dL — ABNORMAL HIGH (ref 65–99)
Glucose-Capillary: 347 mg/dL — ABNORMAL HIGH (ref 65–99)
Glucose-Capillary: 422 mg/dL — ABNORMAL HIGH (ref 65–99)

## 2016-04-29 LAB — HIV ANTIBODY (ROUTINE TESTING W REFLEX): HIV Screen 4th Generation wRfx: NONREACTIVE

## 2016-04-29 LAB — MRSA PCR SCREENING: MRSA by PCR: NEGATIVE

## 2016-04-29 MED ORDER — MOMETASONE FURO-FORMOTEROL FUM 100-5 MCG/ACT IN AERO: 2.0000 | INHALATION_SPRAY | Freq: Two times a day (BID) | RESPIRATORY_TRACT | 3 refills | Status: DC

## 2016-04-29 MED ORDER — MONTELUKAST SODIUM 10 MG PO TABS: 10.0000 mg | ORAL_TABLET | Freq: Every day | ORAL | 3 refills | Status: DC

## 2016-04-29 MED ORDER — INSULIN ASPART 100 UNIT/ML ~~LOC~~ SOLN
10.0000 [IU] | Freq: Once | SUBCUTANEOUS | Status: AC
  Administered 2016-04-29: 10 [IU] via SUBCUTANEOUS

## 2016-04-29 MED ORDER — METFORMIN HCL ER 500 MG PO TB24: 500.0000 mg | ORAL_TABLET | Freq: Every day | ORAL | 3 refills | Status: DC

## 2016-04-29 MED ORDER — ALBUTEROL SULFATE HFA 108 (90 BASE) MCG/ACT IN AERS: 2.0000 | INHALATION_SPRAY | Freq: Four times a day (QID) | RESPIRATORY_TRACT | 5 refills | Status: DC | PRN

## 2016-04-29 MED ORDER — PREDNISONE 10 MG PO TABS: 10.0000 mg | ORAL_TABLET | Freq: Every day | ORAL | 0 refills | Status: DC

## 2016-04-29 MED ORDER — POTASSIUM CHLORIDE CRYS ER 20 MEQ PO TBCR: 20.0000 meq | EXTENDED_RELEASE_TABLET | Freq: Three times a day (TID) | ORAL | 0 refills | Status: DC

## 2016-04-29 MED ORDER — TORSEMIDE 20 MG PO TABS
20.0000 mg | ORAL_TABLET | Freq: Two times a day (BID) | ORAL | 3 refills | Status: DC
Start: 2016-04-29 — End: 2016-09-11

## 2016-04-29 NOTE — Progress Notes (Signed)
Rechecked pt's blood sugar before giving Solumedrol. Pt states he ate fries and burger from significant other. Notified NP. Awaiting response.

## 2016-04-29 NOTE — Progress Notes (Signed)
Pt discharged home today per Dr. Hernandez. Pt's IV site D/C'd and WDL. Pt's VSS. Pt provided with home medication list, discharge instructions and prescriptions. Verbalized understanding. Pt left floor via WC in stable condition accompanied by NT. 

## 2016-04-29 NOTE — Care Management Note (Signed)
Case Management Note  Patient Details  Name: Shawn Ramsey MRN: [id number] Date of Birth: 1980/12/30  Expected Discharge Date:  04/29/16               Expected Discharge Plan:  Home/Self Care  In-House Referral:  NA  Discharge planning Services  CM Consult  Post Acute Care Choice:  NA Choice offered to:  NA  Status of Service:  Completed, signed off  Additional Comments: Pt discharging home today. Pt non-compliant but not homebound and no appropriate for [facility] services. Pt encouraged to go to f/u with PCP and be compliant with medical regimens.  Sherald Barge, RN 04/29/2016, 12:38 PM

## 2016-04-29 NOTE — Discharge Summary (Signed)
Physician Discharge Summary  Shawn Ramsey UVO:[id number] DOB: 08-07-1980 DOA: 04/27/2016  PCP: Tula Nakayama, MD  Admit date: 04/27/2016 Discharge date: 04/29/2016  Time spent: 45 minutes  Recommendations for Outpatient Follow-up:  -Will be discharged home today. -Advised to follow up with PCP in 2 weeks.   Discharge Diagnoses:  Active Problems:   Other chest pain   Dyslipidemia   Essential hypertension   Acute respiratory failure with hypoxia and hypercarbia (HCC)   COPD exacerbation (HCC)   Acute on chronic respiratory failure with hypoxia and hypercapnia (HCC)   Discharge Condition: Stable and improved  Filed Weights   04/27/16 2103 04/28/16 0356  Weight: (!) 158.8 kg (350 lb) (!) 159.2 kg (350 lb 15.6 oz)    History of present illness:  As per Dr. Darrick Meigs on 3/8: Shawn Ramsey  is a 36 y.o. male, With history of COPD, sleep apnea, prediabetes, morbid obesity who came to hospital with worsening shortness of breath for 1 day duration. This was associated with chest pain. He also has been coughing up brown colored phlegm. Patient says that he ran out of his albuterol at home, he also complains of dyspnea on exertion. Patient has home oxygen but does not use it. Patient is also on torsemide for leg edema but he is not compliant with the medications. In the ED CTA chest was done, which was negative for possible embolism. ABG(venous sample) showed PCO2 61.4. Patient started on BiPAP  Hospital Course:   Acute hypoxic and hypercapnic respiratory failure -Multifactorial due to obstructive sleep apnea, obesity hypoventilation syndrome, COPD exacerbation, ongoing tobacco abuse. -Patient has C Pap at home but has been noncompliant, instructed on importance of compliance with CPAP as well as with medications. -At time of discharge currently does not have oxygen requirements, has no wheezing. -We'll be discharged on a prednisone taper. -Instructed on importance of smoking  cessation.  Procedures:  None   Consultations:  None  Discharge Instructions  Discharge Instructions    Diet - low sodium heart healthy    Complete by:  As directed    Increase activity slowly    Complete by:  As directed      Allergies as of 04/29/2016   No Known Allergies     Medication List    STOP taking these medications   ibuprofen 800 MG tablet Commonly known as:  ADVIL,MOTRIN     TAKE these medications   acetaminophen 500 MG tablet Commonly known as:  TYLENOL Take 1,000 mg by mouth every 6 (six) hours as needed for mild pain, moderate pain or headache.   albuterol 108 (90 Base) MCG/ACT inhaler Commonly known as:  PROVENTIL HFA;VENTOLIN HFA Inhale 2 puffs into the lungs every 6 (six) hours as needed for wheezing.   amLODipine 5 MG tablet Commonly known as:  NORVASC Take 1 tablet (5 mg total) by mouth daily.   atorvastatin 40 MG tablet Commonly known as:  LIPITOR Take 1 tablet (40 mg total) by mouth daily.   metFORMIN 500 MG 24 hr tablet Commonly known as:  GLUCOPHAGE XR Take 1 tablet (500 mg total) by mouth daily with breakfast. What changed:  See the new instructions.   mometasone-formoterol 100-5 MCG/ACT Aero Commonly known as:  DULERA Inhale 2 puffs into the lungs 2 (two) times daily. What changed:  when to take this   montelukast 10 MG tablet Commonly known as:  SINGULAIR Take 1 tablet (10 mg total) by mouth at bedtime.   potassium chloride SA  20 MEQ tablet Commonly known as:  K-DUR,KLOR-CON Take 1 tablet (20 mEq total) by mouth 3 (three) times daily.   predniSONE 10 MG tablet Commonly known as:  DELTASONE Take 1 tablet (10 mg total) by mouth daily with breakfast. Take 6 tablets today and then decrease by 1 tablet daily until none are left.   torsemide 20 MG tablet Commonly known as:  DEMADEX Take 1 tablet (20 mg total) by mouth 2 (two) times daily.      No Known Allergies Follow-up Information    Tula Nakayama, MD On 05/04/2016.    Specialty:  Family Medicine Why:  Appointment scheduled for 9:20 AM. Contact information: [address], [place] [address] [phone]            The results of significant diagnostics from this hospitalization (including imaging, microbiology, ancillary and laboratory) are listed below for reference.    Significant Diagnostic Studies: Dg Chest 2 View  Result Date: 04/27/2016 CLINICAL DATA:  Shortness of breath and chest pain EXAM: CHEST  2 VIEW COMPARISON:  08/25/2015 FINDINGS: Mild hyperinflation. Coarse interstitial opacities at the lung bases likely chronic. No acute consolidation or effusion. Stable borderline cardiomegaly. No pneumothorax. IMPRESSION: No active cardiopulmonary disease. Electronically Signed   By: Donavan Foil M.D.   On: 04/27/2016 21:47   Ct Angio Chest Pe W And/or Wo Contrast  Result Date: 04/28/2016 CLINICAL DATA:  Gradually worsening dyspnea for 2 days. Productive cough. Intermittent chest tightness. EXAM: CT ANGIOGRAPHY CHEST WITH CONTRAST TECHNIQUE: Multidetector CT imaging of the chest was performed using the standard protocol during bolus administration of intravenous contrast. Multiplanar CT image reconstructions and MIPs were obtained to evaluate the vascular anatomy. CONTRAST:  100 mL Isovue 370 intravenous COMPARISON:  Radiographs 04/28/2015 FINDINGS: Cardiovascular: For opacification of the pulmonary arteries. No large central emboli. Nondiagnostic study for small peripheral emboli. The thoracic aorta is normal in caliber and intact. Normal heart size. No pericardial effusion. Mediastinum/Nodes: No enlarged mediastinal, hilar, or axillary lymph nodes. Thyroid gland, trachea, and esophagus demonstrate no significant findings. Lungs/Pleura: Lungs are clear. No pleural effusion or pneumothorax. Upper Abdomen: No acute findings. Musculoskeletal: No significant skeletal abnormality. Review of the MIP images confirms the above findings. IMPRESSION:  No large or central emboli. Suboptimal pulmonary artery opacification for emboli beyond the large central vessels. No acute findings are evident in the chest. Electronically Signed   By: Andreas Newport M.D.   On: 04/28/2016 01:04    Microbiology: Recent Results (from the past 240 hour(s))  MRSA PCR Screening     Status: None   Collection Time: 04/28/16  3:49 AM  Result Value Ref Range Status   MRSA by PCR NEGATIVE NEGATIVE Final    Comment:        The GeneXpert MRSA Assay (FDA approved for NASAL specimens only), is one component of a comprehensive MRSA colonization surveillance program. It is not intended to diagnose MRSA infection nor to guide or monitor treatment for MRSA infections.      Labs: Basic Metabolic Panel:  Recent Labs Lab 04/27/16 2306 04/28/16 0448  NA 139 137  K 3.9 4.0  CL 102 100*  CO2 30 30  GLUCOSE 131* 158*  BUN 16 17  CREATININE 0.79 0.80  CALCIUM 9.3 9.0   Liver Function Tests:  Recent Labs Lab 04/28/16 0448  AST 16  ALT 21  ALKPHOS 69  BILITOT 0.4  PROT 7.9  ALBUMIN 4.1   No results for input(s): LIPASE, AMYLASE in  the last 168 hours. No results for input(s): AMMONIA in the last 168 hours. CBC:  Recent Labs Lab 04/27/16 2306 04/28/16 0448  WBC 8.8 7.7  NEUTROABS 5.6  --   HGB 12.8* 12.8*  HCT 41.0 40.9  MCV 87.0 87.6  PLT 327 324   Cardiac Enzymes:  Recent Labs Lab 04/27/16 2306 04/28/16 0448 04/28/16 1041 04/28/16 1613  TROPONINI <0.03 <0.03 <0.03 <0.03   BNP: BNP (last 3 results)  Recent Labs  04/27/16 2307  BNP 20.0    ProBNP (last 3 results) No results for input(s): PROBNP in the last 8760 hours.  CBG:  Recent Labs Lab 04/28/16 1707 04/28/16 2113 04/29/16 0045 04/29/16 0730 04/29/16 1151  GLUCAP 221* 414* 422* 223* 347*       Signed:  HERNANDEZ ACOSTA,Cruz Devilla  [facility] Pager: [phone] 04/29/2016, 4:49 PM

## 2016-04-29 NOTE — Progress Notes (Signed)
Inpatient Diabetes Program Recommendations  AACE/ADA: New Consensus Statement on Inpatient Glycemic Control (2015)  Target Ranges:  Prepandial:   less than 140 mg/dL      Peak postprandial:   less than 180 mg/dL (1-2 hours)      Critically ill patients:  140 - 180 mg/dL  Results for Shawn Ramsey, Shawn Ramsey (MRN [id number]) as of 04/29/2016 09:02  Ref. Range 04/28/2016 07:22 04/28/2016 12:13 04/28/2016 17:07 04/28/2016 21:13 04/29/2016 00:45 04/29/2016 07:30  Glucose-Capillary Latest Ref Range: 65 - 99 mg/dL 218 (H) 280 (H) 221 (H) 414 (H) 422 (H) 223 (H)   Results for Shawn Ramsey, Shawn Ramsey (MRN [id number]) as of 04/29/2016 09:02  Ref. Range 04/28/2016 04:48  Hemoglobin A1C Latest Ref Range: 4.8 - 5.6 % 6.6 (H)   Review of Glycemic Control  Diabetes history: DM2 Outpatient Diabetes medications: Metformin XR 500 mg QAM Current orders for Inpatient glycemic control: Lantus 20 units daily, Novolog 0-15 units TID with meals  Inpatient Diabetes Program Recommendations: Insulin - Basal: If steroids are continued, please consider ordering Lantus 15 units Q24H starting now. Please keep in mind that if Lantus is ordered as recommended, it will need to be adjusted as steroids are tapered. Correction (SSI): Please consider increasing Novolog correction scale to Moderate scale (0-15 units) and adding Novolog 0-5 units QHS for bedtime correction. Insulin - Meal Coverage: If steroids are continued, please consider ordering Novolog 5 units TID with meals for meal coverage if patient eats at least 50% of meals. HgbA1C: A1C 6.6% on 04/28/16 indicating good glycemic control over the past 2-3 months.  Thanks, Barnie Alderman, RN, MSN, CDE Diabetes Coordinator Inpatient Diabetes Program [phone] (Team Pager from 8am to 5pm)

## 2016-05-04 ENCOUNTER — Encounter: Payer: Self-pay | Admitting: Family Medicine

## 2016-05-04 ENCOUNTER — Ambulatory Visit (INDEPENDENT_AMBULATORY_CARE_PROVIDER_SITE_OTHER): Payer: Medicaid Other | Admitting: Family Medicine

## 2016-05-04 VITALS — BP 122/80 | HR 114 | Resp 18 | Ht 65.0 in | Wt 348.0 lb

## 2016-05-04 DIAGNOSIS — I10 Essential (primary) hypertension: Secondary | ICD-10-CM | POA: Diagnosis not present

## 2016-05-04 DIAGNOSIS — Z09 Encounter for follow-up examination after completed treatment for conditions other than malignant neoplasm: Secondary | ICD-10-CM | POA: Diagnosis not present

## 2016-05-04 DIAGNOSIS — J9602 Acute respiratory failure with hypercapnia: Secondary | ICD-10-CM

## 2016-05-04 DIAGNOSIS — G4733 Obstructive sleep apnea (adult) (pediatric): Secondary | ICD-10-CM | POA: Diagnosis not present

## 2016-05-04 DIAGNOSIS — E785 Hyperlipidemia, unspecified: Secondary | ICD-10-CM

## 2016-05-04 DIAGNOSIS — R7303 Prediabetes: Secondary | ICD-10-CM

## 2016-05-04 DIAGNOSIS — E8881 Metabolic syndrome: Secondary | ICD-10-CM

## 2016-05-04 DIAGNOSIS — F17219 Nicotine dependence, cigarettes, with unspecified nicotine-induced disorders: Secondary | ICD-10-CM | POA: Diagnosis not present

## 2016-05-04 DIAGNOSIS — J9601 Acute respiratory failure with hypoxia: Secondary | ICD-10-CM | POA: Diagnosis not present

## 2016-05-04 NOTE — Patient Instructions (Addendum)
F/U in 4 month, call if you need me sooner  VERY IM PORTANT that you use your CPAP machine EVERY DAY to improve your health, and protect your heart and lungs   You are being referred to the nutritionist for help with weight loss , you need to lose weight and you are also referred to dr Dorris Fetch   HBA1c, fasting lipid and cmpmand eGFR in 4 month  Thanks for choosing [facility], we consider it a privelige to serve you.

## 2016-05-15 ENCOUNTER — Encounter: Payer: Self-pay | Admitting: Family Medicine

## 2016-05-15 DIAGNOSIS — F17210 Nicotine dependence, cigarettes, uncomplicated: Secondary | ICD-10-CM | POA: Insufficient documentation

## 2016-05-15 NOTE — Assessment & Plan Note (Signed)
Deteriorated. Patient re-educated about  the importance of commitment to a  minimum of 150 minutes of exercise per week.  The importance of healthy food choices with portion control discussed. Encouraged to start a food diary, count calories and to consider  joining a support group. Sample diet sheets offered. Goals set by the patient for the next several months.   Weight /BMI 05/04/2016 04/28/2016 04/27/2016  WEIGHT 348 lb 350 lb 15.6 oz -  HEIGHT 5\' 5"  5\' 5"  -  BMI 57.91 kg/m2 - 58.4 kg/m2  Some encounter information is confidential and restricted. Go to Review Flowsheets activity to see all data.

## 2016-05-15 NOTE — Assessment & Plan Note (Addendum)
Resolved , hospitalized 1 week prior in acute failure

## 2016-05-15 NOTE — Assessment & Plan Note (Signed)
Remains non compliant which is absolutely detrimental to his health, re educated re the absolute need to use his CPAP machine

## 2016-05-15 NOTE — Assessment & Plan Note (Signed)

## 2016-05-15 NOTE — Assessment & Plan Note (Signed)
Controlled, no change in medication  

## 2016-05-15 NOTE — Assessment & Plan Note (Signed)
Reviewed records and medications since recent hospitalization, again stressed importance of CPAP compliance which he still is inconsistent with, also smoking cessation. Medications also reviewed, unfortunately he has not brought medication to the visit

## 2016-05-15 NOTE — Progress Notes (Signed)
   Shawn Ramsey     MRN: [id number]      DOB: 1980/08/01   HPI [name]   Patient in for follow up of recent hospitalization.from 3/7 to 04/29/2016 acute respiratory failure with hypoxia, hypercarbia and COPD exacerbation, and also chest pain Discharge summary, and laboratory and radiology data are reviewed, and any questions or concerns about recent hospitalization are discussed. Specific issues requiring follow up are specifically addressed.    ROS Denies recent fever or chills. Denies sinus pressure, nasal congestion, ear pain or sore throat.  Denies abdominal pain, nausea, vomiting,diarrhea or constipation.   Denies dysuria, frequency, hesitancy or incontinence. c/o joint pain, swelling and limitation in mobility. Denies headaches, seizures, numbness, or tingling. Denies depression, anxiety or insomnia. Denies skin break down or rash.   PE  BP 122/80   Pulse (!) 114   Resp 18   Ht 5\' 5"  (1.651 m)   Wt (!) 348 lb (157.9 kg)   SpO2 94%   BMI 57.91 kg/m   Patient alert and oriented and in no cardiopulmonary distress.  HEENT: No facial asymmetry, EOMI,   oropharynx pink and moist.  Neck supple no JVD, no mass.  Chest: Clear to auscultation bilaterally.  CVS: S1, S2 no murmurs, no S3.Regular rate.  ABD: Soft non tender.   Ext: No edema  MS: Adequate ROM spine, shoulders, hips and knees.  Skin: Intact, no ulcerations or rash noted.  Psych: Good eye contact, normal affect. Memory intact not anxious or depressed appearing.  CNS: CN 2-12 intact, power,  normal throughout.no focal deficits noted.   [facility] discharge follow-up Reviewed records and medications since recent hospitalization, again stressed importance of CPAP compliance which he still is inconsistent with, also smoking cessation. Medications also reviewed, unfortunately he has not brought medication to the visit  Cigarette nicotine dependence Patient counseled for approximately  5 minutes regarding the health risks of ongoing nicotine use, specifically all types of cancer, heart disease, stroke and respiratory failure. The options available for help with cessation ,the behavioral changes to assist the process, and the option to either gradully reduce usage  Or abruptly stop.is also discussed. Pt is also encouraged to set specific goals in number of cigarettes used daily, as well as to set a quit date.     Acute respiratory failure with hypoxia and hypercarbia (HCC) Resolved , hospitalized 1 week prior in acute failure  Essential hypertension Controlled, no change in medication   Morbid obesity Deteriorated. Patient re-educated about  the importance of commitment to a  minimum of 150 minutes of exercise per week.  The importance of healthy food choices with portion control discussed. Encouraged to start a food diary, count calories and to consider  joining a support group. Sample diet sheets offered. Goals set by the patient for the next several months.   Weight /BMI 05/04/2016 04/28/2016 04/27/2016  WEIGHT 348 lb 350 lb 15.6 oz -  HEIGHT 5\' 5"  5\' 5"  -  BMI 57.91 kg/m2 - 58.4 kg/m2  Some encounter information is confidential and restricted. Go to Review Flowsheets activity to see all data.      OSA (obstructive sleep apnea) Remains non compliant which is absolutely detrimental to his health, re educated re the absolute need to use his CPAP machine

## 2016-05-26 ENCOUNTER — Encounter: Payer: Medicaid Other | Attending: Family Medicine | Admitting: Nutrition

## 2016-05-26 VITALS — Ht 65.0 in | Wt 354.6 lb

## 2016-05-26 DIAGNOSIS — Z713 Dietary counseling and surveillance: Secondary | ICD-10-CM | POA: Diagnosis not present

## 2016-05-26 DIAGNOSIS — E118 Type 2 diabetes mellitus with unspecified complications: Secondary | ICD-10-CM

## 2016-05-26 DIAGNOSIS — Z6841 Body Mass Index (BMI) 40.0 and over, adult: Secondary | ICD-10-CM

## 2016-05-26 DIAGNOSIS — IMO0002 Reserved for concepts with insufficient information to code with codable children: Secondary | ICD-10-CM

## 2016-05-26 DIAGNOSIS — E1165 Type 2 diabetes mellitus with hyperglycemia: Secondary | ICD-10-CM

## 2016-05-26 NOTE — Progress Notes (Signed)
Diabetes Self-Management Education  Visit Type: First/Initial  Appt. Start Time: 1330 Appt. End Time: 6063  05/26/2016  [name], identified by name and date of birth, is a 36 y.o. male with a diagnosis of Diabetes: Type 2. Here with his mom. He admits to eating 3 meals or more and snacks between meals because he is bored and wants to eat. Diet is high in fat, salt and sugar and low in fresh fruits and vegetables. Has sleep apnea but just started wearing the mask again. He is falling asleep during session, so the session was limited due to him being able to stay awake for instruction. He notes he has to go get a new mask because the other one isn't fitting correctly. He also notes he has portable oxygen at home but isn't using it unless he is at home. Doesn't have a meter yet but willing to get one when ordered.    Diet is excessive to meet his needs as evidenced by morbid obesity of BMI 59 and Type 2 DM with A1C 6.6%.   Lab Results  Component Value Date   HGBA1C 6.6 (H) 04/28/2016    ASSESSMENT  Height 5\' 5"  (1.651 m), weight (!) 354 lb 9.6 oz (160.8 kg). Body mass index is 59.01 kg/m.      Diabetes Self-Management Education - 05/26/16 1333      Visit Information   Visit Type First/Initial     Initial Visit   Diabetes Type Type 2   Are you taking your medications as prescribed? No     Health Coping   How would you rate your overall health? Poor     Psychosocial Assessment   Self-care barriers None   Self-management support Doctor's office;Family   Other persons present Patient   Patient Concerns Nutrition/Meal planning;Medication;Healthy Lifestyle;Weight Control   Special Needs Instruct caregiver   Preferred Learning Style No preference indicated   Learning Readiness Contemplating   How often do you need to have someone help you when you read instructions, pamphlets, or other written materials from your doctor or pharmacy? 3 - Sometimes   What is the last grade  level you completed in school? 10     Pre-Education Assessment   Patient understands the diabetes disease and treatment process. Needs Instruction   Patient understands incorporating nutritional management into lifestyle. Needs Instruction   Patient undertands incorporating physical activity into lifestyle. Needs Instruction   Patient understands using medications safely. Needs Instruction   Patient understands monitoring blood glucose, interpreting and using results Needs Instruction   Patient understands prevention, detection, and treatment of acute complications. Needs Instruction   Patient understands prevention, detection, and treatment of chronic complications. Needs Instruction   Patient understands how to develop strategies to address psychosocial issues. Needs Instruction   Patient understands how to develop strategies to promote health/change behavior. Needs Instruction     Complications   Last HgB A1C per patient/outside source 6.6 %   How often do you check your blood sugar? 0 times/day (not testing)   Have you had a dilated eye exam in the past 12 months? No   Have you had a dental exam in the past 12 months? No   Are you checking your feet? No     Dietary Intake   Breakfast 2 eggs and egg/cheese biscuits and large tea, hash brown 1   Snack (morning) cheseburger   Lunch hamburger steak broccoli and cheese, double cheese burger and fries, sweet tea   Snack (  afternoon) misc   Dinner steak ribeye  12 oz, fries, salad, [place] dew   Snack (evening) popcycle    Beverage(s) soda, tea     Exercise   Exercise Type ADL's     Patient Education   Previous Diabetes Education No   Disease state  Definition of diabetes, type 1 and 2, and the diagnosis of diabetes;Factors that contribute to the development of diabetes   Nutrition management  Role of diet in the treatment of diabetes and the relationship between the three main macronutrients and blood glucose level;Carbohydrate  counting;Information on hints to eating out and maintain blood glucose control.   Physical activity and exercise  Role of exercise on diabetes management, blood pressure control and cardiac health.   Medications Reviewed patients medication for diabetes, action, purpose, timing of dose and side effects.   Acute complications Discussed and identified patients' treatment of hyperglycemia.   Chronic complications Relationship between chronic complications and blood glucose control;Lipid levels, blood glucose control and heart disease;Retinopathy and reason for yearly dilated eye exams;Reviewed with patient heart disease, higher risk of, and prevention   Psychosocial adjustment Worked with patient to identify barriers to care and solutions     Individualized Goals (developed by patient)   Nutrition Follow meal plan discussed;General guidelines for healthy choices and portions discussed;Adjust meds/carbs with exercise as discussed   Medications take my medication as prescribed   Monitoring  test my blood glucose as discussed   Reducing Risk examine blood glucose patterns;stop smoking   Health Coping --  with meals. Wear CPAP and oxygen daily.     Post-Education Assessment   Patient understands the diabetes disease and treatment process. Needs Review   Patient understands incorporating nutritional management into lifestyle. Needs Review   Patient undertands incorporating physical activity into lifestyle. Needs Review   Patient understands using medications safely. Needs Review   Patient understands monitoring blood glucose, interpreting and using results Needs Review   Patient understands prevention, detection, and treatment of acute complications. Needs Review   Patient understands prevention, detection, and treatment of chronic complications. Needs Review   Patient understands how to develop strategies to address psychosocial issues. Needs Review   Patient understands how to develop strategies to  promote health/change behavior. Needs Review     Outcomes   Expected Outcomes Demonstrated interest in learning. Expect positive outcomes   Future DMSE 4-6 wks   Program Status Completed      Individualized Plan for Diabetes Self-Management Training:   Learning Objective:  Patient will have a greater understanding of diabetes self-management. Patient education plan is to attend individual and/or group sessions per assessed needs and concerns.   Plan:   Goals 1. Follow My Plate 2. Eat meals on time 3. Eat 3 carb choices per meal 4. Increase fresh fruits and vegetables. 5. Drink only water- no sodas, diet sodas or tea, juices 6. Walk 15-30 minutes a day 7. Cut out snacks between meals. 8. Get A1C less than 8%. Lose 1-2 lbs per week.  Expected Outcomes:  Demonstrated interest in learning. Expect positive outcomes  Education material provided: Living Well with Diabetes, Food label handouts, A1C conversion sheet, Meal plan card, My Plate and Carbohydrate counting sheet  If problems or questions, patient to contact team via:  Phone and Email   Future DSME appointment: 4-6 wks

## 2016-05-27 ENCOUNTER — Telehealth: Payer: Self-pay | Admitting: Family Medicine

## 2016-05-27 NOTE — Telephone Encounter (Signed)
Patient's mom [name])   asking for [place].  Would like to know if patient is a Diabetic or not.  Please clarify.

## 2016-05-27 NOTE — Telephone Encounter (Signed)
Spoke with mother.

## 2016-06-13 NOTE — Patient Instructions (Addendum)
Goals 1. Follow My Plate 2. Eat meals on time 3. Eat 3 carb choices per meal 4. Increase fresh fruits and vegetables. 5. Drink only water- no sodas, diet sodas or tea, juices 6. Walk 15-30 minutes a day 7. Cut out snacks between meals. 8. Get A1C less than 8%. Lose 1-2 lbs per week.

## 2016-06-16 ENCOUNTER — Ambulatory Visit: Payer: Medicaid Other | Admitting: Family Medicine

## 2016-06-29 ENCOUNTER — Telehealth: Payer: Self-pay | Admitting: Nutrition

## 2016-06-29 ENCOUNTER — Ambulatory Visit: Payer: Medicaid Other | Admitting: Nutrition

## 2016-06-29 NOTE — Telephone Encounter (Signed)
TC to reschedule missed appt. Talked to pt's mother and rescheduled for tomorrow at 3:30 pm. Pt's mother will bring him for his appt. A1C 6.6% in March of 2018 and per ADA guidelines, this is Type 2 DM, not prediabetes. P'ts mother verbalized understanding.

## 2016-06-30 ENCOUNTER — Ambulatory Visit: Payer: Medicaid Other | Admitting: Nutrition

## 2016-07-03 ENCOUNTER — Emergency Department (HOSPITAL_COMMUNITY): Payer: Medicaid Other

## 2016-07-03 ENCOUNTER — Encounter (HOSPITAL_COMMUNITY): Payer: Self-pay | Admitting: Emergency Medicine

## 2016-07-03 DIAGNOSIS — Z79899 Other long term (current) drug therapy: Secondary | ICD-10-CM | POA: Diagnosis not present

## 2016-07-03 DIAGNOSIS — F17210 Nicotine dependence, cigarettes, uncomplicated: Secondary | ICD-10-CM | POA: Diagnosis not present

## 2016-07-03 DIAGNOSIS — R600 Localized edema: Secondary | ICD-10-CM | POA: Insufficient documentation

## 2016-07-03 DIAGNOSIS — R0602 Shortness of breath: Secondary | ICD-10-CM | POA: Diagnosis not present

## 2016-07-03 DIAGNOSIS — J449 Chronic obstructive pulmonary disease, unspecified: Secondary | ICD-10-CM | POA: Diagnosis not present

## 2016-07-03 DIAGNOSIS — E119 Type 2 diabetes mellitus without complications: Secondary | ICD-10-CM | POA: Insufficient documentation

## 2016-07-03 DIAGNOSIS — J45909 Unspecified asthma, uncomplicated: Secondary | ICD-10-CM | POA: Diagnosis not present

## 2016-07-03 DIAGNOSIS — Z7984 Long term (current) use of oral hypoglycemic drugs: Secondary | ICD-10-CM | POA: Insufficient documentation

## 2016-07-03 LAB — CBC
HCT: 37.5 % — ABNORMAL LOW (ref 39.0–52.0)
Hemoglobin: 11.7 g/dL — ABNORMAL LOW (ref 13.0–17.0)
MCH: 27.3 pg (ref 26.0–34.0)
MCHC: 31.2 g/dL (ref 30.0–36.0)
MCV: 87.6 fL (ref 78.0–100.0)
Platelets: 326 10*3/uL (ref 150–400)
RBC: 4.28 MIL/uL (ref 4.22–5.81)
RDW: 16 % — ABNORMAL HIGH (ref 11.5–15.5)
WBC: 9.1 10*3/uL (ref 4.0–10.5)

## 2016-07-03 LAB — BASIC METABOLIC PANEL
Anion gap: 10 (ref 5–15)
BUN: 17 mg/dL (ref 6–20)
CO2: 29 mmol/L (ref 22–32)
Calcium: 8.9 mg/dL (ref 8.9–10.3)
Chloride: 104 mmol/L (ref 101–111)
Creatinine, Ser: 0.69 mg/dL (ref 0.61–1.24)
GFR calc Af Amer: >60 mL/min (ref >60)
GFR calc non Af Amer: >60 mL/min (ref >60)
Glucose, Bld: 191 mg/dL — ABNORMAL HIGH (ref 65–99)
Potassium: 3.8 mmol/L (ref 3.5–5.1)
Sodium: 143 mmol/L (ref 135–145)

## 2016-07-03 LAB — BRAIN NATRIURETIC PEPTIDE: B Natriuretic Peptide: 31 pg/mL (ref 0.0–100.0)

## 2016-07-03 NOTE — ED Triage Notes (Signed)
Pt c/o sob and leg swelling x one week.

## 2016-07-04 ENCOUNTER — Telehealth: Payer: Self-pay | Admitting: Family Medicine

## 2016-07-04 ENCOUNTER — Emergency Department (HOSPITAL_COMMUNITY)
Admission: EM | Admit: 2016-07-04 | Discharge: 2016-07-04 | Disposition: A | Discharge status: Home / Self Care | Payer: Medicaid Other | Attending: Emergency Medicine | Admitting: Emergency Medicine

## 2016-07-04 DIAGNOSIS — R600 Localized edema: Secondary | ICD-10-CM

## 2016-07-04 DIAGNOSIS — R0602 Shortness of breath: Secondary | ICD-10-CM

## 2016-07-04 MED ORDER — FUROSEMIDE 10 MG/ML IJ SOLN
80.0000 mg | Freq: Once | INTRAMUSCULAR | Status: AC
  Administered 2016-07-04: 80 mg via INTRAMUSCULAR
  Filled 2016-07-04: qty 8

## 2016-07-04 NOTE — Telephone Encounter (Signed)
azavion bouillon is requesting you to call her about this patient, she wont specify the reasons. (she is on the hipaa form)   Cb#: [phone]

## 2016-07-04 NOTE — ED Notes (Signed)
Pt ambulated around nursing station on room air and o2 stayed at 92%, edp notified.
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
## 2016-07-04 NOTE — Discharge Instructions (Signed)
You were seen today for shortness of breath and lower extremity swelling. This is likely related to not taking her fluid pill. It is very important that you take your medications daily. It is also important that you usually CPAP.  If you are not sleeping well at night or not tolerating your C Pap, you need follow-up with your primary physician for alternatives. If you have any new or worsening symptoms he should be reevaluated.

## 2016-07-04 NOTE — Telephone Encounter (Signed)
Was opened in error °

## 2016-07-06 NOTE — Telephone Encounter (Signed)
Spoke with Shawn Ramsey during her visit today and she could not recall the reason for her call

## 2016-07-13 ENCOUNTER — Ambulatory Visit: Payer: Medicaid Other | Admitting: Nutrition

## 2016-07-13 ENCOUNTER — Ambulatory Visit (INDEPENDENT_AMBULATORY_CARE_PROVIDER_SITE_OTHER): Payer: Medicaid Other | Admitting: Family Medicine

## 2016-07-13 ENCOUNTER — Encounter: Payer: Self-pay | Admitting: Family Medicine

## 2016-07-13 VITALS — BP 124/80 | HR 92 | Temp 98.4°F | Resp 28 | Ht 65.0 in | Wt 361.1 lb

## 2016-07-13 DIAGNOSIS — E669 Obesity, unspecified: Secondary | ICD-10-CM | POA: Diagnosis not present

## 2016-07-13 DIAGNOSIS — E1169 Type 2 diabetes mellitus with other specified complication: Secondary | ICD-10-CM

## 2016-07-13 DIAGNOSIS — I10 Essential (primary) hypertension: Secondary | ICD-10-CM

## 2016-07-13 DIAGNOSIS — L2082 Flexural eczema: Secondary | ICD-10-CM | POA: Diagnosis not present

## 2016-07-13 DIAGNOSIS — Z6841 Body Mass Index (BMI) 40.0 and over, adult: Secondary | ICD-10-CM

## 2016-07-13 DIAGNOSIS — F17200 Nicotine dependence, unspecified, uncomplicated: Secondary | ICD-10-CM

## 2016-07-13 DIAGNOSIS — E785 Hyperlipidemia, unspecified: Secondary | ICD-10-CM

## 2016-07-13 DIAGNOSIS — L819 Disorder of pigmentation, unspecified: Secondary | ICD-10-CM | POA: Diagnosis not present

## 2016-07-13 DIAGNOSIS — G4733 Obstructive sleep apnea (adult) (pediatric): Secondary | ICD-10-CM | POA: Diagnosis not present

## 2016-07-13 DIAGNOSIS — R7303 Prediabetes: Secondary | ICD-10-CM | POA: Diagnosis not present

## 2016-07-13 MED ORDER — BETAMETHASONE DIPROPIONATE 0.05 % EX CREA: TOPICAL_CREAM | Freq: Two times a day (BID) | CUTANEOUS | 0 refills | Status: DC

## 2016-07-13 MED ORDER — METFORMIN HCL ER 500 MG PO TB24: 500.0000 mg | ORAL_TABLET | Freq: Every day | ORAL | 1 refills | Status: DC

## 2016-07-13 NOTE — Progress Notes (Signed)
Shawn Ramsey     MRN: [id number]      DOB: Jul 02, 1980   HPI Mr. Mchargue is here for follow up and re-evaluation of chronic medical conditions, medication management and review of any available recent lab and radiology data.  Preventive health is updated, specifically  Cancer screening and Immunization.   Seen in ED on 5/14 for dyspnea and here for follow up . Reports less short of breath and less leg swelling Pt c/o excess urination at night, does not put mask back on at night, requests bedside commode Mother c/o hyperpigmentation of skin and rough skin over hands, obesity will refer to endo for eval C/o excessive weight gain interested in weight reduction surgery and will again give info on same  ROS Denies recent fever or chills. Denies sinus pressure, nasal congestion, ear pain or sore throat. Denies chest congestion, productive cough or wheezing. Denies chest pains, palpitations  Denies abdominal pain, nausea, vomiting,diarrhea or constipation.   Denies dysuria, frequency, hesitancy or incontinence. C/o chronic  joint pain, swelling and limitation in mobility. Denies headaches, seizures, numbness, or tingling. Denies depression, anxiety PE  BP 124/80 (BP Location: Left Arm, Patient Position: Sitting, Cuff Size: Large)   Pulse 92   Temp 98.4 F (36.9 C) (Temporal)   Resp (!) 28   Ht 5\' 5"  (1.651 m)   Wt (!) 361 lb 1.3 oz (163.8 kg)   SpO2 91%   BMI 60.09 kg/m   Patient alert and oriented and in no cardiopulmonary distress.  HEENT: No facial asymmetry, EOMI,   oropharynx pink and moist.  Neck supple no JVD, no mass.  Chest: Clear to auscultation bilaterally.  CVS: S1, S2 no murmurs, no S3.Regular rate.  ABD: Soft non tender.   Ext: one plus pitting  edema  MS: adequate  though reduced  ROM spine, shoulders, hips and knees.  Skin: Intact, hyperpigmentation of skin on dorsum of hands  Psych: Good eye contact, normal affect.  not anxious or depressed  appearing.  CNS: CN 2-12 intact, power,  normal throughout.no focal deficits noted.   Assessment & Plan  Morbid obesity with BMI of 60.0-69.9, adult (HCC) Morbid obesity, metabolic syndrome, hyperpigmentation, questionable endocrine disorder, refer for further evaluation  NICOTINE ADDICTION Patient is asked and  confirms current  Nicotine use.  Five to seven minutes of time is spent in counseling the patient of the need to quit smoking  Advice to quit is delivered clearly specifically in reducing the risk of developing heart disease, having a stroke, or of developing all types of cancer, especially lung and oral cancer. Improvement in breathing and exercise tolerance and quality of life is also discussed, as is the economic benefit.In this particular patient the benefit to lung function and breathing is stressed  Assessment of willingness to quit or to make an attempt to quit is made and documented, unwilling to set quit date   Assistance in quit attempt is made with several and varied options presented, based on patient's desire and need. These include  literature, local classes available, 1800 QUIT NOW number, OTC and prescription medication.  The GOAL to be NICOTINE FREE is re emphasized.  The patient has set a personal goal of either reduction or discontinuation and follow up is arranged in 16 weeks.Reports smoking at most 2 cigarettes per week currently however history is unreliable and is swayed by presence of his mother who may be in denial as to his ongoing nicotine use   Eczema Eczema noted  on dorsum and in web spaces of digits, short term limited use of steroid twice daily to affected  Areas refer to dermatology if persists  OSA (obstructive sleep apnea) Still reports inconsistent use od CPAP, states has to get up multiple times at night to urinate and forgets to replace the mask, requests bedside commode to assist witrh this, which is appropriate as he is on diuretic therapy,  will request same  Diabetes mellitus type 2 in obese [facility]) Patient educated about the importance of limiting  Carbohydrate intake , the need to commit to daily physical activity for a minimum of 30 minutes , and to commit weight loss. The fact that changes in all these areas will reduce or eliminate all together the development of diabetes is stressed.  Deteriorated, now diabetic with elevated HBa1C on metformin  Diabetic Labs Latest Ref Rng & Units 07/03/2016 04/28/2016 04/27/2016 02/05/2016 06/14/2015  HbA1c 4.8 - 5.6 % - 6.6(H) - 6.5(H) -  Chol <200 mg/dL - - - 166 -  HDL >40 mg/dL - - - 30(L) -  Calc LDL <100 mg/dL - - - 114(H) -  Triglycerides <150 mg/dL - - - 111 -  Creatinine 0.61 - 1.24 mg/dL 0.69 0.80 0.79 0.65 0.77   BP/Weight 07/13/2016 07/04/2016 07/03/2016 05/26/2016 05/04/2016 04/28/3777 04/29/6884  Systolic BP 484 720 - - 721 828 -  Diastolic BP 80 91 - - 80 83 -  Wt. (Lbs) 361.08 - 350 354.6 348 - 350.97  BMI 60.09 58.24 - 59.01 57.91 - -  Some encounter information is confidential and restricted. Go to Review Flowsheets activity to see all data.   No flowsheet data found.

## 2016-07-13 NOTE — Patient Instructions (Signed)
F/u in 3 month, call if you need me before  HBA1C, fasting lipid, cmp and EGFR as soon as possible  I will refer you to Dr Dorris Fetch  Cream is being sent in for rough skin as discussed   You will receive info on place to call for bariatric surgery  I will request a bedside commode  Please be careful not to fall  You need to use your CPAP all night

## 2016-07-14 ENCOUNTER — Ambulatory Visit: Payer: Medicaid Other | Admitting: Nutrition

## 2016-07-19 ENCOUNTER — Encounter: Payer: Self-pay | Admitting: Family Medicine

## 2016-07-19 DIAGNOSIS — L309 Dermatitis, unspecified: Secondary | ICD-10-CM | POA: Insufficient documentation

## 2016-07-19 HISTORY — DX: Dermatitis, unspecified: L30.9

## 2016-07-19 NOTE — Assessment & Plan Note (Signed)
Morbid obesity, metabolic syndrome, hyperpigmentation, questionable endocrine disorder, refer for further evaluation

## 2016-07-19 NOTE — Assessment & Plan Note (Signed)
Patient educated about the importance of limiting  Carbohydrate intake , the need to commit to daily physical activity for a minimum of 30 minutes , and to commit weight loss. The fact that changes in all these areas will reduce or eliminate all together the development of diabetes is stressed.  Deteriorated, now diabetic with elevated HBa1C on metformin  Diabetic Labs Latest Ref Rng & Units 07/03/2016 04/28/2016 04/27/2016 02/05/2016 06/14/2015  HbA1c 4.8 - 5.6 % - 6.6(H) - 6.5(H) -  Chol <200 mg/dL - - - 166 -  HDL >40 mg/dL - - - 30(L) -  Calc LDL <100 mg/dL - - - 114(H) -  Triglycerides <150 mg/dL - - - 111 -  Creatinine 0.61 - 1.24 mg/dL 0.69 0.80 0.79 0.65 0.77   BP/Weight 07/13/2016 07/04/2016 07/03/2016 05/26/2016 05/04/2016 0/03/2177 09/21/252  Systolic BP 862 824 - - 175 301 -  Diastolic BP 80 91 - - 80 83 -  Wt. (Lbs) 361.08 - 350 354.6 348 - 350.97  BMI 60.09 58.24 - 59.01 57.91 - -  Some encounter information is confidential and restricted. Go to Review Flowsheets activity to see all data.   No flowsheet data found.

## 2016-07-19 NOTE — Assessment & Plan Note (Signed)
Eczema noted on dorsum and in web spaces of digits, short term limited use of steroid twice daily to affected  Areas refer to dermatology if persists

## 2016-07-19 NOTE — Assessment & Plan Note (Addendum)
Patient is asked and  confirms current  Nicotine use.  Five to seven minutes of time is spent in counseling the patient of the need to quit smoking  Advice to quit is delivered clearly specifically in reducing the risk of developing heart disease, having a stroke, or of developing all types of cancer, especially lung and oral cancer. Improvement in breathing and exercise tolerance and quality of life is also discussed, as is the economic benefit.In this particular patient the benefit to lung function and breathing is stressed  Assessment of willingness to quit or to make an attempt to quit is made and documented, unwilling to set quit date   Assistance in quit attempt is made with several and varied options presented, based on patient's desire and need. These include  literature, local classes available, 1800 QUIT NOW number, OTC and prescription medication.  The GOAL to be NICOTINE FREE is re emphasized.  The patient has set a personal goal of either reduction or discontinuation and follow up is arranged in 16 weeks.Reports smoking at most 2 cigarettes per week currently however history is unreliable and is swayed by presence of his mother who may be in denial as to his ongoing nicotine use

## 2016-07-19 NOTE — Assessment & Plan Note (Addendum)
Still reports inconsistent use od CPAP, states has to get up multiple times at night to urinate and forgets to replace the mask, requests bedside commode to assist witrh this, which is appropriate as he is on diuretic therapy, will request same

## 2016-07-21 ENCOUNTER — Ambulatory Visit: Payer: Medicaid Other | Admitting: Family Medicine

## 2016-07-25 ENCOUNTER — Telehealth: Payer: Self-pay | Admitting: Family Medicine

## 2016-07-25 ENCOUNTER — Other Ambulatory Visit: Payer: Self-pay | Admitting: Family Medicine

## 2016-07-25 DIAGNOSIS — F513 Sleepwalking [somnambulism]: Secondary | ICD-10-CM

## 2016-07-25 NOTE — Telephone Encounter (Signed)
Patients mother called in concerned about patients sleep walking, he fell in the night. She is afraid he will hurt himself and wants to discuss what she should do. Cb#: [phone]

## 2016-07-25 NOTE — Telephone Encounter (Signed)
This is completed.  

## 2016-07-25 NOTE — Telephone Encounter (Signed)
I recommend neurology evaluation for sleep walking and have told her this and am referring him to Dr Merlene Laughter

## 2016-08-03 ENCOUNTER — Telehealth: Payer: Self-pay | Admitting: *Deleted

## 2016-08-03 NOTE — Telephone Encounter (Signed)
[facility] called stating patient will have to see Dr Merlene Laughter being he has seen him within the last 3 years and the patient can not est care with [facility] for that reason. Patient needs to be referred back to Dr Merlene Laughter

## 2016-08-04 ENCOUNTER — Encounter (HOSPITAL_COMMUNITY): Payer: Self-pay | Admitting: Emergency Medicine

## 2016-08-04 ENCOUNTER — Emergency Department (HOSPITAL_COMMUNITY)
Admission: EM | Admit: 2016-08-04 | Discharge: 2016-08-04 | Disposition: A | Discharge status: Home / Self Care | Payer: Medicaid Other | Attending: Emergency Medicine | Admitting: Emergency Medicine

## 2016-08-04 ENCOUNTER — Other Ambulatory Visit: Payer: Self-pay | Admitting: Family Medicine

## 2016-08-04 DIAGNOSIS — Z79899 Other long term (current) drug therapy: Secondary | ICD-10-CM | POA: Insufficient documentation

## 2016-08-04 DIAGNOSIS — J449 Chronic obstructive pulmonary disease, unspecified: Secondary | ICD-10-CM | POA: Diagnosis not present

## 2016-08-04 DIAGNOSIS — J45909 Unspecified asthma, uncomplicated: Secondary | ICD-10-CM | POA: Diagnosis not present

## 2016-08-04 DIAGNOSIS — I10 Essential (primary) hypertension: Secondary | ICD-10-CM | POA: Insufficient documentation

## 2016-08-04 DIAGNOSIS — L03211 Cellulitis of face: Secondary | ICD-10-CM | POA: Insufficient documentation

## 2016-08-04 DIAGNOSIS — F17210 Nicotine dependence, cigarettes, uncomplicated: Secondary | ICD-10-CM | POA: Insufficient documentation

## 2016-08-04 DIAGNOSIS — F513 Sleepwalking [somnambulism]: Secondary | ICD-10-CM

## 2016-08-04 DIAGNOSIS — R600 Localized edema: Secondary | ICD-10-CM | POA: Diagnosis present

## 2016-08-04 DIAGNOSIS — Z7984 Long term (current) use of oral hypoglycemic drugs: Secondary | ICD-10-CM | POA: Diagnosis not present

## 2016-08-04 DIAGNOSIS — E119 Type 2 diabetes mellitus without complications: Secondary | ICD-10-CM | POA: Diagnosis not present

## 2016-08-04 HISTORY — DX: Chronic obstructive pulmonary disease, unspecified: J44.9

## 2016-08-04 LAB — CBG MONITORING, ED: Glucose-Capillary: 116 mg/dL — ABNORMAL HIGH (ref 65–99)

## 2016-08-04 MED ORDER — CEPHALEXIN 500 MG PO CAPS: 500.0000 mg | ORAL_CAPSULE | Freq: Four times a day (QID) | ORAL | 0 refills | Status: DC

## 2016-08-04 MED ORDER — SULFAMETHOXAZOLE-TRIMETHOPRIM 800-160 MG PO TABS: 1.0000 | ORAL_TABLET | Freq: Two times a day (BID) | ORAL | 0 refills | Status: DC

## 2016-08-04 MED ORDER — NICARDIPINE HCL IN NACL 20-0.86 MG/200ML-% IV SOLN: 0.0000 mg/h | INTRAVENOUS | Status: DC

## 2016-08-04 NOTE — Telephone Encounter (Signed)
Referral entered , pls explain the reason he to patient and his mother why he is being referred to Dr Merlene Laughter so they expect the call from that office

## 2016-08-04 NOTE — ED Triage Notes (Signed)
Pt reports he woke up with upper lip swelling into his face, airway patent at this time, no tongue swelling noted.  Pt denies lisinopril use, denies any new medications or foods. Pt alert and oriented at this time.
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
## 2016-08-04 NOTE — Telephone Encounter (Signed)
Ok to refer back?

## 2016-08-05 NOTE — Telephone Encounter (Signed)
[place]

## 2016-08-11 ENCOUNTER — Ambulatory Visit: Payer: Medicaid Other | Admitting: Family Medicine

## 2016-08-12 ENCOUNTER — Telehealth: Payer: Self-pay

## 2016-08-12 DIAGNOSIS — I872 Venous insufficiency (chronic) (peripheral): Secondary | ICD-10-CM

## 2016-08-12 DIAGNOSIS — M79605 Pain in left leg: Secondary | ICD-10-CM

## 2016-08-12 DIAGNOSIS — J441 Chronic obstructive pulmonary disease with (acute) exacerbation: Secondary | ICD-10-CM

## 2016-08-15 NOTE — Telephone Encounter (Signed)
rx printed

## 2016-08-22 ENCOUNTER — Telehealth: Payer: Self-pay

## 2016-08-22 NOTE — Telephone Encounter (Signed)
Pt is requesting a RX for [employer] to [facility]

## 2016-08-23 ENCOUNTER — Other Ambulatory Visit: Payer: Self-pay | Admitting: Family Medicine

## 2016-08-23 MED ORDER — TRAMADOL HCL 50 MG PO TABS: 50.0000 mg | ORAL_TABLET | Freq: Two times a day (BID) | ORAL | 0 refills | Status: DC | PRN

## 2016-08-23 NOTE — Telephone Encounter (Signed)
Called mother and made aware. States shell just get him some advil pm.

## 2016-08-23 NOTE — Telephone Encounter (Signed)
Script for 40 tabs one twice daily as needed is prescribed, I assume it is for left leg pain which is what he ahs complained of in the past, pls verify in a a note, thanks

## 2016-08-23 NOTE — Telephone Encounter (Signed)
I do see where this has been rxd in the past.. It looks like 2016 was the last time, do you want to rx this?

## 2016-08-23 NOTE — Telephone Encounter (Signed)
pls call and notify pharmacy nOT to dispense tramadol if the script was ent, Will need to discuss the message as it is  is confusing to me, Alcus sleeep all the time , so unsure / don't understand why he would need medication to help him sleep

## 2016-08-23 NOTE — Telephone Encounter (Signed)
Called and spoke to pts mother, states Shawn Ramsey is still asleep. Will have him call office when he wakes.

## 2016-08-23 NOTE — Telephone Encounter (Signed)
pts mom stopped in office. She states he doesn't need tramsdol, she is asking if you can give him something for sleep.

## 2016-08-25 NOTE — Telephone Encounter (Signed)
noted 

## 2016-08-30 ENCOUNTER — Ambulatory Visit (INDEPENDENT_AMBULATORY_CARE_PROVIDER_SITE_OTHER): Payer: Medicaid Other | Admitting: "Endocrinology

## 2016-08-30 ENCOUNTER — Other Ambulatory Visit: Payer: Self-pay

## 2016-08-30 ENCOUNTER — Encounter: Payer: Self-pay | Admitting: "Endocrinology

## 2016-08-30 VITALS — Ht 65.0 in | Wt 360.0 lb

## 2016-08-30 DIAGNOSIS — Z6841 Body Mass Index (BMI) 40.0 and over, adult: Secondary | ICD-10-CM | POA: Diagnosis not present

## 2016-08-30 DIAGNOSIS — E1169 Type 2 diabetes mellitus with other specified complication: Secondary | ICD-10-CM

## 2016-08-30 DIAGNOSIS — E669 Obesity, unspecified: Secondary | ICD-10-CM
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
## 2016-08-30 NOTE — Patient Instructions (Signed)

## 2016-09-02 LAB — T4, FREE: Free T4: 1 ng/dL (ref 0.8–1.8)

## 2016-09-02 LAB — TSH: TSH: 1.75 mIU/L (ref 0.40–4.50)

## 2016-09-05 ENCOUNTER — Ambulatory Visit: Payer: Medicaid Other | Admitting: Family Medicine

## 2016-09-05 LAB — CORTISOL, URINE, 24 HOUR
Cortisol (Ur), Free: 15.5 mcg/24 h (ref 4.0–50.0)
RESULTS RECEIVED: 1.76 g/(24.h) (ref 0.63–2.50)

## 2016-09-06 ENCOUNTER — Inpatient Hospital Stay (HOSPITAL_COMMUNITY)
Admission: EM | Admit: 2016-09-06 | Discharge: 2016-09-11 | DRG: 189 | Disposition: A | Discharge status: Home / Self Care | Payer: Medicaid Other | Attending: Internal Medicine | Admitting: Internal Medicine

## 2016-09-06 ENCOUNTER — Emergency Department (HOSPITAL_COMMUNITY): Payer: Medicaid Other

## 2016-09-06 DIAGNOSIS — Z6841 Body Mass Index (BMI) 40.0 and over, adult: Secondary | ICD-10-CM

## 2016-09-06 DIAGNOSIS — F29 Unspecified psychosis not due to a substance or known physiological condition: Secondary | ICD-10-CM | POA: Diagnosis present

## 2016-09-06 DIAGNOSIS — J9621 Acute and chronic respiratory failure with hypoxia: Principal | ICD-10-CM | POA: Diagnosis present

## 2016-09-06 DIAGNOSIS — J9622 Acute and chronic respiratory failure with hypercapnia: Secondary | ICD-10-CM | POA: Diagnosis present

## 2016-09-06 DIAGNOSIS — I5033 Acute on chronic diastolic (congestive) heart failure: Secondary | ICD-10-CM | POA: Diagnosis present

## 2016-09-06 DIAGNOSIS — I110 Hypertensive heart disease with heart failure: Secondary | ICD-10-CM | POA: Diagnosis present

## 2016-09-06 DIAGNOSIS — E669 Obesity, unspecified: Secondary | ICD-10-CM

## 2016-09-06 DIAGNOSIS — E1169 Type 2 diabetes mellitus with other specified complication: Secondary | ICD-10-CM

## 2016-09-06 DIAGNOSIS — Z7984 Long term (current) use of oral hypoglycemic drugs: Secondary | ICD-10-CM

## 2016-09-06 DIAGNOSIS — J4520 Mild intermittent asthma, uncomplicated: Secondary | ICD-10-CM

## 2016-09-06 DIAGNOSIS — Z91199 Patient's noncompliance with other medical treatment and regimen due to unspecified reason: Secondary | ICD-10-CM

## 2016-09-06 DIAGNOSIS — G4733 Obstructive sleep apnea (adult) (pediatric): Secondary | ICD-10-CM | POA: Diagnosis present

## 2016-09-06 DIAGNOSIS — Z823 Family history of stroke: Secondary | ICD-10-CM

## 2016-09-06 DIAGNOSIS — R6250 Unspecified lack of expected normal physiological development in childhood: Secondary | ICD-10-CM | POA: Diagnosis present

## 2016-09-06 DIAGNOSIS — F17210 Nicotine dependence, cigarettes, uncomplicated: Secondary | ICD-10-CM | POA: Diagnosis present

## 2016-09-06 DIAGNOSIS — J9600 Acute respiratory failure, unspecified whether with hypoxia or hypercapnia: Secondary | ICD-10-CM | POA: Diagnosis present

## 2016-09-06 DIAGNOSIS — J9601 Acute respiratory failure with hypoxia: Secondary | ICD-10-CM

## 2016-09-06 DIAGNOSIS — G43909 Migraine, unspecified, not intractable, without status migrainosus: Secondary | ICD-10-CM | POA: Diagnosis present

## 2016-09-06 DIAGNOSIS — E119 Type 2 diabetes mellitus without complications: Secondary | ICD-10-CM | POA: Diagnosis present

## 2016-09-06 DIAGNOSIS — J441 Chronic obstructive pulmonary disease with (acute) exacerbation: Secondary | ICD-10-CM | POA: Diagnosis present

## 2016-09-06 DIAGNOSIS — F819 Developmental disorder of scholastic skills, unspecified: Secondary | ICD-10-CM | POA: Diagnosis present

## 2016-09-06 DIAGNOSIS — E662 Morbid (severe) obesity with alveolar hypoventilation: Secondary | ICD-10-CM | POA: Diagnosis present

## 2016-09-06 DIAGNOSIS — Z9119 Patient's noncompliance with other medical treatment and regimen: Secondary | ICD-10-CM

## 2016-09-06 DIAGNOSIS — Z8249 Family history of ischemic heart disease and other diseases of the circulatory system: Secondary | ICD-10-CM

## 2016-09-06 DIAGNOSIS — J9602 Acute respiratory failure with hypercapnia: Secondary | ICD-10-CM | POA: Diagnosis present

## 2016-09-06 NOTE — ED Triage Notes (Signed)
Pt c/o increased sob that has gotten worse today and started a couple of days ago

## 2016-09-07 ENCOUNTER — Encounter (HOSPITAL_COMMUNITY): Payer: Self-pay | Admitting: *Deleted

## 2016-09-07 ENCOUNTER — Inpatient Hospital Stay (HOSPITAL_COMMUNITY): Payer: Medicaid Other

## 2016-09-07 ENCOUNTER — Other Ambulatory Visit: Payer: Self-pay

## 2016-09-07 DIAGNOSIS — I5033 Acute on chronic diastolic (congestive) heart failure: Secondary | ICD-10-CM | POA: Diagnosis present

## 2016-09-07 DIAGNOSIS — E872 Acidosis: Secondary | ICD-10-CM | POA: Diagnosis not present

## 2016-09-07 DIAGNOSIS — R0602 Shortness of breath: Secondary | ICD-10-CM | POA: Diagnosis present

## 2016-09-07 DIAGNOSIS — J9601 Acute respiratory failure with hypoxia: Secondary | ICD-10-CM | POA: Diagnosis not present

## 2016-09-07 DIAGNOSIS — Z9119 Patient's noncompliance with other medical treatment and regimen: Secondary | ICD-10-CM | POA: Diagnosis not present

## 2016-09-07 DIAGNOSIS — F17219 Nicotine dependence, cigarettes, with unspecified nicotine-induced disorders: Secondary | ICD-10-CM

## 2016-09-07 DIAGNOSIS — E119 Type 2 diabetes mellitus without complications: Secondary | ICD-10-CM | POA: Diagnosis present

## 2016-09-07 DIAGNOSIS — E669 Obesity, unspecified: Secondary | ICD-10-CM | POA: Diagnosis not present

## 2016-09-07 DIAGNOSIS — Z823 Family history of stroke: Secondary | ICD-10-CM | POA: Diagnosis not present

## 2016-09-07 DIAGNOSIS — F17210 Nicotine dependence, cigarettes, uncomplicated: Secondary | ICD-10-CM | POA: Diagnosis present

## 2016-09-07 DIAGNOSIS — J9602 Acute respiratory failure with hypercapnia: Secondary | ICD-10-CM

## 2016-09-07 DIAGNOSIS — I509 Heart failure, unspecified: Secondary | ICD-10-CM | POA: Diagnosis not present

## 2016-09-07 DIAGNOSIS — G4733 Obstructive sleep apnea (adult) (pediatric): Secondary | ICD-10-CM

## 2016-09-07 DIAGNOSIS — F819 Developmental disorder of scholastic skills, unspecified: Secondary | ICD-10-CM | POA: Diagnosis present

## 2016-09-07 DIAGNOSIS — Z6841 Body Mass Index (BMI) 40.0 and over, adult: Secondary | ICD-10-CM

## 2016-09-07 DIAGNOSIS — J9622 Acute and chronic respiratory failure with hypercapnia: Secondary | ICD-10-CM | POA: Diagnosis present

## 2016-09-07 DIAGNOSIS — R6250 Unspecified lack of expected normal physiological development in childhood: Secondary | ICD-10-CM | POA: Diagnosis present

## 2016-09-07 DIAGNOSIS — Z7984 Long term (current) use of oral hypoglycemic drugs: Secondary | ICD-10-CM | POA: Diagnosis not present

## 2016-09-07 DIAGNOSIS — E1169 Type 2 diabetes mellitus with other specified complication: Secondary | ICD-10-CM | POA: Diagnosis not present

## 2016-09-07 DIAGNOSIS — Z8249 Family history of ischemic heart disease and other diseases of the circulatory system: Secondary | ICD-10-CM | POA: Diagnosis not present

## 2016-09-07 DIAGNOSIS — Z91199 Patient's noncompliance with other medical treatment and regimen due to unspecified reason: Secondary | ICD-10-CM

## 2016-09-07 DIAGNOSIS — E662 Morbid (severe) obesity with alveolar hypoventilation: Secondary | ICD-10-CM | POA: Diagnosis present

## 2016-09-07 DIAGNOSIS — J9621 Acute and chronic respiratory failure with hypoxia: Secondary | ICD-10-CM | POA: Diagnosis present

## 2016-09-07 DIAGNOSIS — I110 Hypertensive heart disease with heart failure: Secondary | ICD-10-CM | POA: Diagnosis present

## 2016-09-07 DIAGNOSIS — J441 Chronic obstructive pulmonary disease with (acute) exacerbation: Secondary | ICD-10-CM | POA: Diagnosis present

## 2016-09-07 DIAGNOSIS — J9600 Acute respiratory failure, unspecified whether with hypoxia or hypercapnia: Secondary | ICD-10-CM | POA: Diagnosis present

## 2016-09-07 DIAGNOSIS — F29 Unspecified psychosis not due to a substance or known physiological condition: Secondary | ICD-10-CM | POA: Diagnosis present

## 2016-09-07 DIAGNOSIS — G43909 Migraine, unspecified, not intractable, without status migrainosus: Secondary | ICD-10-CM | POA: Diagnosis present

## 2016-09-07 LAB — BLOOD GAS, ARTERIAL
Acid-Base Excess: 4.5 mmol/L — ABNORMAL HIGH (ref 0.0–2.0)
Acid-Base Excess: 3.9 mmol/L — ABNORMAL HIGH (ref 0.0–2.0)
Acid-Base Excess: 5 mmol/L — ABNORMAL HIGH (ref 0.0–2.0)
Acid-Base Excess: 5.5 mmol/L — ABNORMAL HIGH (ref 0.0–2.0)
Bicarbonate: 27 mmol/L (ref 20.0–28.0)
Bicarbonate: 26.3 mmol/L (ref 20.0–28.0)
Bicarbonate: 26.4 mmol/L (ref 20.0–28.0)
Bicarbonate: 28.3 mmol/L — ABNORMAL HIGH (ref 20.0–28.0)
Delivery systems: POSITIVE
Delivery systems: POSITIVE
Delivery systems: POSITIVE
Drawn by: 21310
Drawn by: 234301
Drawn by: 234301
Drawn by: 234301
Expiratory PAP: 10
Expiratory PAP: 10
Expiratory PAP: 5
FIO2: 45
FIO2: 45
FIO2: 45
Inspiratory PAP: 20
Inspiratory PAP: 20
Inspiratory PAP: 20
Mode: POSITIVE
Mode: POSITIVE
Mode: POSITIVE
O2 Content: 3 L/min
O2 Saturation: 94.9 %
O2 Saturation: 94.8 %
O2 Saturation: 91.5 %
O2 Saturation: 89.6 %
Patient temperature: 37
Patient temperature: 37
pCO2 arterial: 67.5 mmHg (ref 32.0–48.0)
pCO2 arterial: 66.7 mmHg (ref 32.0–48.0)
pCO2 arterial: 83.2 mmHg (ref 32.0–48.0)
pCO2 arterial: 53.8 mmHg — ABNORMAL HIGH (ref 32.0–48.0)
pH, Arterial: 7.282 — ABNORMAL LOW (ref 7.350–7.450)
pH, Arterial: 7.278 — ABNORMAL LOW (ref 7.350–7.450)
pH, Arterial: 7.215 — ABNORMAL LOW (ref 7.350–7.450)
pH, Arterial: 7.372 (ref 7.350–7.450)
pO2, Arterial: 89.1 mmHg (ref 83.0–108.0)
pO2, Arterial: 83 mmHg (ref 83.0–108.0)
pO2, Arterial: 76.3 mmHg — ABNORMAL LOW (ref 83.0–108.0)
pO2, Arterial: 59.4 mmHg — ABNORMAL LOW (ref 83.0–108.0)

## 2016-09-07 LAB — GLUCOSE, CAPILLARY
Glucose-Capillary: 132 mg/dL — ABNORMAL HIGH (ref 65–99)
Glucose-Capillary: 121 mg/dL — ABNORMAL HIGH (ref 65–99)
Glucose-Capillary: 149 mg/dL — ABNORMAL HIGH (ref 65–99)
Glucose-Capillary: 126 mg/dL — ABNORMAL HIGH (ref 65–99)
Glucose-Capillary: 164 mg/dL — ABNORMAL HIGH (ref 65–99)

## 2016-09-07 LAB — CBC WITH DIFFERENTIAL/PLATELET
Basophils Absolute: 0 10*3/uL (ref 0.0–0.1)
Basophils Relative: 0 %
Eosinophils Absolute: 0.1 10*3/uL (ref 0.0–0.7)
Eosinophils Relative: 1 %
HCT: 36.5 % — ABNORMAL LOW (ref 39.0–52.0)
Hemoglobin: 11.3 g/dL — ABNORMAL LOW (ref 13.0–17.0)
Lymphocytes Relative: 23 %
Lymphs Abs: 2 10*3/uL (ref 0.7–4.0)
MCH: 27.3 pg (ref 26.0–34.0)
MCHC: 31 g/dL (ref 30.0–36.0)
MCV: 88.2 fL (ref 78.0–100.0)
Monocytes Absolute: 0.9 10*3/uL (ref 0.1–1.0)
Monocytes Relative: 10 %
Neutro Abs: 5.5 10*3/uL (ref 1.7–7.7)
Neutrophils Relative %: 66 %
Platelets: 345 10*3/uL (ref 150–400)
RBC: 4.14 MIL/uL — ABNORMAL LOW (ref 4.22–5.81)
RDW: 15.8 % — ABNORMAL HIGH (ref 11.5–15.5)
WBC: 8.6 10*3/uL (ref 4.0–10.5)

## 2016-09-07 LAB — TROPONIN I: Troponin I: <0.03 ng/mL (ref <0.03)

## 2016-09-07 LAB — ECHOCARDIOGRAM COMPLETE
Height: 65 in
Weight: 5689.63 oz

## 2016-09-07 LAB — BASIC METABOLIC PANEL
Anion gap: 8 (ref 5–15)
BUN: 14 mg/dL (ref 6–20)
CO2: 32 mmol/L (ref 22–32)
Calcium: 8.8 mg/dL — ABNORMAL LOW (ref 8.9–10.3)
Chloride: 102 mmol/L (ref 101–111)
Creatinine, Ser: 0.85 mg/dL (ref 0.61–1.24)
GFR calc Af Amer: >60 mL/min (ref >60)
GFR calc non Af Amer: >60 mL/min (ref >60)
Glucose, Bld: 109 mg/dL — ABNORMAL HIGH (ref 65–99)
Potassium: 4 mmol/L (ref 3.5–5.1)
Sodium: 142 mmol/L (ref 135–145)

## 2016-09-07 LAB — BRAIN NATRIURETIC PEPTIDE: B Natriuretic Peptide: 18 pg/mL (ref 0.0–100.0)

## 2016-09-07 LAB — TSH: TSH: 1.287 u[IU]/mL (ref 0.350–4.500)

## 2016-09-07 LAB — MRSA PCR SCREENING: MRSA by PCR: POSITIVE — AB

## 2016-09-07 MED ORDER — LEVOFLOXACIN IN D5W 750 MG/150ML IV SOLN
750.0000 mg | INTRAVENOUS | Status: DC
  Administered 2016-09-07: 750 mg via INTRAVENOUS
  Filled 2016-09-07: qty 150

## 2016-09-07 MED ORDER — IPRATROPIUM-ALBUTEROL 0.5-2.5 (3) MG/3ML IN SOLN: 3.0000 mL | RESPIRATORY_TRACT | Status: DC | PRN

## 2016-09-07 MED ORDER — IPRATROPIUM-ALBUTEROL 0.5-2.5 (3) MG/3ML IN SOLN
3.0000 mL | Freq: Once | RESPIRATORY_TRACT | Status: AC
  Administered 2016-09-07: 3 mL via RESPIRATORY_TRACT
  Filled 2016-09-07: qty 3

## 2016-09-07 MED ORDER — PANTOPRAZOLE SODIUM 40 MG PO TBEC
40.0000 mg | DELAYED_RELEASE_TABLET | Freq: Every day | ORAL | Status: DC
  Administered 2016-09-07 – 2016-09-11 (×5): 40 mg via ORAL
  Filled 2016-09-07 (×5): qty 1

## 2016-09-07 MED ORDER — INSULIN ASPART 100 UNIT/ML ~~LOC~~ SOLN
0.0000 [IU] | Freq: Three times a day (TID) | SUBCUTANEOUS | Status: DC
  Administered 2016-09-08: 4 [IU] via SUBCUTANEOUS
  Administered 2016-09-10: 3 [IU] via SUBCUTANEOUS
  Administered 2016-09-10: 4 [IU] via SUBCUTANEOUS
  Administered 2016-09-11: 3 [IU] via SUBCUTANEOUS

## 2016-09-07 MED ORDER — CHLORHEXIDINE GLUCONATE CLOTH 2 % EX PADS
6.0000 | MEDICATED_PAD | Freq: Every day | CUTANEOUS | Status: DC
  Administered 2016-09-08 – 2016-09-11 (×3): 6 via TOPICAL

## 2016-09-07 MED ORDER — SODIUM CHLORIDE 0.9% FLUSH
3.0000 mL | Freq: Two times a day (BID) | INTRAVENOUS | Status: DC
  Administered 2016-09-07 – 2016-09-11 (×7): 3 mL via INTRAVENOUS

## 2016-09-07 MED ORDER — HEPARIN SODIUM (PORCINE) 5000 UNIT/ML IJ SOLN
7500.0000 [IU] | Freq: Three times a day (TID) | INTRAMUSCULAR | Status: DC
  Administered 2016-09-07 – 2016-09-11 (×13): 7500 [IU] via SUBCUTANEOUS
  Filled 2016-09-07 (×13): qty 2

## 2016-09-07 MED ORDER — ATORVASTATIN CALCIUM 40 MG PO TABS
40.0000 mg | ORAL_TABLET | Freq: Every day | ORAL | Status: DC
  Administered 2016-09-07 – 2016-09-11 (×5): 40 mg via ORAL
  Filled 2016-09-07 (×5): qty 1

## 2016-09-07 MED ORDER — METHYLPREDNISOLONE SODIUM SUCC 125 MG IJ SOLR
125.0000 mg | Freq: Once | INTRAMUSCULAR | Status: AC
  Administered 2016-09-07: 125 mg via INTRAVENOUS
  Filled 2016-09-07: qty 2

## 2016-09-07 MED ORDER — ACETAMINOPHEN 500 MG PO TABS
1000.0000 mg | ORAL_TABLET | Freq: Four times a day (QID) | ORAL | Status: DC | PRN
  Administered 2016-09-07 (×2): 1000 mg via ORAL
  Filled 2016-09-07 (×2): qty 2

## 2016-09-07 MED ORDER — METHYLPREDNISOLONE SODIUM SUCC 40 MG IJ SOLR
40.0000 mg | Freq: Four times a day (QID) | INTRAMUSCULAR | Status: DC
Start: 2016-09-07 — End: 2016-09-07

## 2016-09-07 MED ORDER — FUROSEMIDE 10 MG/ML IJ SOLN
80.0000 mg | Freq: Once | INTRAMUSCULAR | Status: AC
  Administered 2016-09-07: 80 mg via INTRAVENOUS
  Filled 2016-09-07: qty 8

## 2016-09-07 MED ORDER — POTASSIUM CHLORIDE CRYS ER 20 MEQ PO TBCR
20.0000 meq | EXTENDED_RELEASE_TABLET | Freq: Three times a day (TID) | ORAL | Status: DC
Start: 2016-09-07 — End: 2016-09-08
  Administered 2016-09-07 – 2016-09-08 (×4): 20 meq via ORAL
  Filled 2016-09-07 (×4): qty 1

## 2016-09-07 MED ORDER — MUPIROCIN 2 % EX OINT
1.0000 "application " | TOPICAL_OINTMENT | Freq: Two times a day (BID) | CUTANEOUS | Status: DC
  Administered 2016-09-07 – 2016-09-11 (×8): 1 via NASAL
  Filled 2016-09-07 (×2): qty 22

## 2016-09-07 MED ORDER — INSULIN ASPART 100 UNIT/ML ~~LOC~~ SOLN
0.0000 [IU] | SUBCUTANEOUS | Status: DC
  Administered 2016-09-07 (×4): 3 [IU] via SUBCUTANEOUS

## 2016-09-07 MED ORDER — IPRATROPIUM-ALBUTEROL 0.5-2.5 (3) MG/3ML IN SOLN
3.0000 mL | Freq: Four times a day (QID) | RESPIRATORY_TRACT | Status: DC
  Administered 2016-09-07 – 2016-09-09 (×10): 3 mL via RESPIRATORY_TRACT
  Filled 2016-09-07 (×10): qty 3

## 2016-09-07 MED ORDER — AMLODIPINE BESYLATE 5 MG PO TABS
5.0000 mg | ORAL_TABLET | Freq: Every day | ORAL | Status: DC
  Administered 2016-09-07 – 2016-09-11 (×5): 5 mg via ORAL
  Filled 2016-09-07 (×5): qty 1

## 2016-09-07 NOTE — ED Notes (Signed)
Ambulated patient in hallway. Saturations 91-94%, hr 110-129.

## 2016-09-07 NOTE — Progress Notes (Signed)
*  PRELIMINARY RESULTS* Echocardiogram 2D Echocardiogram has been performed.  Shawn Ramsey 09/07/2016, 3:34 PM
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
## 2016-09-07 NOTE — Progress Notes (Signed)
Offered to place pt on Bipap for sleep and pt declined. Pt asked that I return around midnight to place Bipap.

## 2016-09-07 NOTE — ED Notes (Signed)
ED Provider at bedside. 

## 2016-09-07 NOTE — Progress Notes (Signed)
CRITICAL VALUE ALERT  Critical Value:  ABG Values PH7.21, Pco2 83.2, Po2 76.3, Sats 91% bicarb 26.4  Date & Time Notied: 09/07/16 1240  Provider Notified:Dr. Cathlean Sauer   Orders Received/Actions taken: Change Bipap Settings, Repeat ABG in few hours

## 2016-09-07 NOTE — Progress Notes (Signed)
PROGRESS NOTE    Shawn Ramsey  TOI:[id number] DOB: 1980-08-22 DOA: 09/06/2016 PCP: Fayrene Helper, MD    Brief Narrative:  36 year old male who presented with dyspnea. Patient is known to have asthma, morbid obesity, obesity hypoventilation syndrome, severe obstructive sleep apnea, COPD and ongoing tobacco abuse. Presented with worsening dyspnea for last 7 days, unable to sleep at night, was not able to use his CPAP. Initial physical examination blood pressure 142/93, heart rate 95, respiratory 21-23, oxygen saturation 98%. Mucous membranes were moist, his lungs were clear to auscultation bilaterally, decreased ventilation, heart S1-S2 present and rhythmic, his abdomen was protuberant with no rebound guarding or organomegaly, positive nonpitting edema bilateral lower extremities. Sodium 142, potassium 4.0, chloride 102, bicarbonate 32, glucose 109, BUN 14, creatinine 0.85, white count 8.6, hemoglobin 11.3,, hematocrit 36.5, platelets 345. Acute blood gas on 3 L flow per nasal cannula, pH 7.28, PCO2 67.5, PO2 89.1, bicarbonate 27, oxygen saturation 94.9%. Chest film with increased lung markings bilaterally, no effusions, infiltrates or pneumothorax. EKG with normal sinus rhythm, rate of 98 bpm.   Patient was admitted to the hospital with working diagnosis of acute hypercapnic respiratory failure.   Assessment & Plan:   Principal Problem:   Acute respiratory failure with hypercapnia (HCC) Active Problems:   Morbid obesity with BMI of 60.0-69.9, adult (HCC)   OSA (obstructive sleep apnea)   COPD exacerbation (HCC)   Cigarette nicotine dependence   Medical non-compliance   Acute respiratory failure ([place])  1.  Acute on chronic hypercapnic respiratory failure. Will continue non invasive mechanical ventilation with bipap, 20/10 and will check on follow up arterial blood gas. When asleep tV 200 to 300, but when awake and seating go up to 500 to 700's. Will keep npo for now, until more  stable ventilation. Personally reviewed chest x ray, with no infiltrate, will hold on antibiotic therapy, no significant wheezing, will hold on steroids, but will continue aggressive bronchodilator therapy with duoneb. Patient is a difficult airway.   2. Asthma/ COPD, with chronic hypoxic respiratory failure. Will continue bronchodilator therapy no signs of exacerbation, will hold on systemic steroids for now.   3. Diastolic heart failure. Stable with no exacerbation. Patient had furosemide IV on admission, will hold on further diuresis, will follow on renal functio and volume status.   4. T2Dm. Will continue NPO due to risk of worsening respiratory failure, will continue insulin sliding scale for glucose cover and monitoring. Capillary glucose 132, 121, 149.    DVT prophylaxis: heparin Code Status: full  Family Communication: no family at bedside  Disposition Plan: home    Consultants:     Procedures:     Antimicrobials:    Subjective: Patient feeling better, still very somnolent, no nausea or vomiting, no chest pain, no dyspnea. On full face mask bipap.   Objective: Vitals:   09/07/16 0500 09/07/16 0600 09/07/16 0700 09/07/16 0800  BP: (!) 166/55 132/85 (!) 122/98 (!) 129/108  Pulse: (!) 110 (!) 103 (!) 111 (!) 107  Resp: 14 18 17  (!) 24  Temp:      TempSrc:      SpO2: 95% 96% 96% 91%  Weight:      Height:        Intake/Output Summary (Last 24 hours) at 09/07/16 0812 Last data filed at 09/07/16 0439  Gross per 24 hour  Intake              150 ml  Output  2250 ml  Net            -2100 ml   Filed Weights   09/06/16 2143 09/07/16 0402  Weight: (!) 163.3 kg (360 lb) (!) 161.3 kg (355 lb 9.6 oz)    Examination:  General exam: deconditioned and somnolent E ENT; mild pallor, no icterus, oral mucosa dry, on full face mask.  Respiratory system: decreased breath sounds, no wheezing, rales or rhonchi.  Cardiovascular system: S1 & S2 heard, RRR. No JVD,  murmurs, rubs, gallops or clicks. Non pittingl edema ++. Gastrointestinal system: Abdomen is nondistended, soft and nontender. No organomegaly or masses felt. Normal bowel sounds heard. Central nervous system: Somnolent, able to follow commands.  Extremities: Symmetric 5 x 5 power. Skin: No rashes, lesions or ulcers     Data Reviewed: I have personally reviewed following labs and imaging studies  CBC:  Recent Labs Lab 09/07/16 0034  WBC 8.6  NEUTROABS 5.5  HGB 11.3*  HCT 36.5*  MCV 88.2  PLT 749   Basic Metabolic Panel:  Recent Labs Lab 09/07/16 0034  NA 142  K 4.0  CL 102  CO2 32  GLUCOSE 109*  BUN 14  CREATININE 0.85  CALCIUM 8.8*   GFR: Estimated Creatinine Clearance: 174 mL/min (by C-G formula based on SCr of 0.85 mg/dL). Liver Function Tests: No results for input(s): AST, ALT, ALKPHOS, BILITOT, PROT, ALBUMIN in the last 168 hours. No results for input(s): LIPASE, AMYLASE in the last 168 hours. No results for input(s): AMMONIA in the last 168 hours. Coagulation Profile: No results for input(s): INR, PROTIME in the last 168 hours. Cardiac Enzymes:  Recent Labs Lab 09/07/16 0034  TROPONINI <0.03   BNP (last 3 results) No results for input(s): PROBNP in the last 8760 hours. HbA1C: No results for input(s): HGBA1C in the last 72 hours. CBG:  Recent Labs Lab 09/07/16 0431 09/07/16 0807  GLUCAP 132* 121*   Lipid Profile: No results for input(s): CHOL, HDL, LDLCALC, TRIG, CHOLHDL, LDLDIRECT in the last 72 hours. Thyroid Function Tests:  Recent Labs  09/07/16 0611  TSH 1.287   Anemia Panel: No results for input(s): VITAMINB12, FOLATE, FERRITIN, TIBC, IRON, RETICCTPCT in the last 72 hours. Sepsis Labs: No results for input(s): PROCALCITON, LATICACIDVEN in the last 168 hours.  No results found for this or any previous visit (from the past 240 hour(s)).       Radiology Studies: Dg Chest 2 View  Result Date: 09/06/2016 CLINICAL DATA:   Shortness of breath and cough EXAM: CHEST  2 VIEW COMPARISON:  Chest radiograph 07/03/2016 FINDINGS: Unchanged cardiomegaly. Shallow lung inflation with mildly prominent interstitial opacities suggesting pulmonary vascular congestion. No pleural effusion or pneumothorax. No focal consolidation. IMPRESSION: Cardiomegaly and moderate pulmonary vascular congestion. Electronically Signed   By: Ulyses Jarred M.D.   On: 09/06/2016 22:17        Scheduled Meds: . amLODipine  5 mg Oral Daily  . atorvastatin  40 mg Oral Daily  . heparin  7,500 Units Subcutaneous Q8H  . insulin aspart  0-20 Units Subcutaneous Q4H  . methylPREDNISolone (SOLU-MEDROL) injection  40 mg Intravenous Q6H  . pantoprazole  40 mg Oral Daily  . potassium chloride SA  20 mEq Oral TID  . sodium chloride flush  3 mL Intravenous Q12H   Continuous Infusions: . levofloxacin (LEVAQUIN) IV 750 mg (09/07/16 0439)     LOS: 0 days     Mauricio Gerome Apley, MD [facility] Pager [phone]  If 7PM-7AM, please  contact night-coverage [email or web address] Password [email or web address] 09/07/2016, 8:12 AM

## 2016-09-07 NOTE — Progress Notes (Signed)
Pt transferred to ICU 10 on BIPAP same setting pt tol well

## 2016-09-07 NOTE — ED Notes (Signed)
admitting Provider at bedside. 

## 2016-09-07 NOTE — ED Notes (Signed)
Patient is falling asleep in the room, his oxygen is dropping on room air to 76% and then returns to the low 90's. Patient reports he does have sleep apnea and is non compliant with his cpap at night. Oxygen applied @ 2 liters. Will continue to monitor
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
## 2016-09-07 NOTE — Progress Notes (Signed)
Bipap is on standby at bedside. Pt on 2L [place] tolerating well.

## 2016-09-07 NOTE — Progress Notes (Addendum)
CRITICAL VALUE ALERT  Critical Value:  ABG VALUES: PH 7.27, PCO2 66.7, PO2 83, Sats 94.8, Bicarb 26.3   Date & Time Notied:  09/07/16 0935  Provider Notified: Dr. Cathlean Sauer   Orders Received/Actions taken:Repeat ABG today at 12PM; continue Bipap

## 2016-09-08 DIAGNOSIS — J441 Chronic obstructive pulmonary disease with (acute) exacerbation: Secondary | ICD-10-CM

## 2016-09-08 LAB — BLOOD GAS, ARTERIAL
Acid-Base Excess: 5.7 mmol/L — ABNORMAL HIGH (ref 0.0–2.0)
Bicarbonate: 28.4 mmol/L — ABNORMAL HIGH (ref 20.0–28.0)
Drawn by: 28459
O2 Content: 3 L/min
O2 Saturation: 90.7 %
Patient temperature: 37
pCO2 arterial: 56.3 mmHg — ABNORMAL HIGH (ref 32.0–48.0)
pH, Arterial: 7.359 (ref 7.350–7.450)
pO2, Arterial: 63.4 mmHg — ABNORMAL LOW (ref 83.0–108.0)

## 2016-09-08 LAB — BASIC METABOLIC PANEL
Anion gap: 9 (ref 5–15)
BUN: 14 mg/dL (ref 6–20)
CO2: 33 mmol/L — ABNORMAL HIGH (ref 22–32)
Calcium: 9 mg/dL (ref 8.9–10.3)
Chloride: 98 mmol/L — ABNORMAL LOW (ref 101–111)
Creatinine, Ser: 0.69 mg/dL (ref 0.61–1.24)
GFR calc Af Amer: >60 mL/min (ref >60)
GFR calc non Af Amer: >60 mL/min (ref >60)
Glucose, Bld: 107 mg/dL — ABNORMAL HIGH (ref 65–99)
Potassium: 4.5 mmol/L (ref 3.5–5.1)
Sodium: 140 mmol/L (ref 135–145)

## 2016-09-08 LAB — GLUCOSE, CAPILLARY
Glucose-Capillary: 114 mg/dL — ABNORMAL HIGH (ref 65–99)
Glucose-Capillary: 79 mg/dL (ref 65–99)
Glucose-Capillary: 94 mg/dL (ref 65–99)
Glucose-Capillary: 153 mg/dL — ABNORMAL HIGH (ref 65–99)

## 2016-09-08 MED ORDER — ACETAMINOPHEN 500 MG PO TABS
500.0000 mg | ORAL_TABLET | Freq: Four times a day (QID) | ORAL | Status: DC | PRN
  Administered 2016-09-09 – 2016-09-10 (×2): 500 mg via ORAL
  Filled 2016-09-08 (×2): qty 1

## 2016-09-08 NOTE — Progress Notes (Signed)
PROGRESS NOTE    Shawn Ramsey  ZOX:[id number] DOB: Dec 13, 1980 DOA: 09/06/2016 PCP: Fayrene Helper, MD    Brief Narrative:  36 year old male who presented with dyspnea. Patient is known to have asthma, morbid obesity, obesity hypoventilation syndrome, severe obstructive sleep apnea, COPD and ongoing tobacco abuse. Presented with worsening dyspnea for last 7 days, unable to sleep at night, was not able to use his CPAP. Initial physical examination blood pressure 142/93, heart rate 95, respiratory 21-23, oxygen saturation 98%. Mucous membranes were moist, his lungs were clear to auscultation bilaterally, decreased ventilation, heart S1-S2 present and rhythmic, his abdomen was protuberant with no rebound guarding or organomegaly, positive nonpitting edema bilateral lower extremities. Sodium 142, potassium 4.0, chloride 102, bicarbonate 32, glucose 109, BUN 14, creatinine 0.85, white count 8.6, hemoglobin 11.3,, hematocrit 36.5, platelets 345. Acute blood gas on 3 L flow per nasal cannula, pH 7.28, PCO2 67.5, PO2 89.1, bicarbonate 27, oxygen saturation 94.9%. Chest film with increased lung markings bilaterally, no effusions, infiltrates or pneumothorax. EKG with normal sinus rhythm, rate of 98 bpm.   Patient was admitted to the hospital with working diagnosis of acute hypercapnic respiratory failure.   Assessment & Plan:   Principal Problem:   Acute respiratory failure with hypercapnia (HCC) Active Problems:   Morbid obesity with BMI of 60.0-69.9, adult (HCC)   OSA (obstructive sleep apnea)   COPD exacerbation (HCC)   Cigarette nicotine dependence   Medical non-compliance   Acute respiratory failure ([place])  1.  Acute on chronic hypercapnic respiratory failure. Patient has responded well to non invasive mechanical ventilation, required high inspiratory pressure to overcome restrictive physiology due to obesity, will continue 20/5 for now and will follow on blood gas in am. Patient is more  awake and alert,. Will advance diet to diabetic/ heart healthy. Continue bronchodilator therapy, patient will resume home ventilator, will need bipap Mode to prevent recurrent hypercapnea.   2. Asthma/ COPD, with chronic hypoxic respiratory failure. On bronchodilator therapy, no significant wheezing, on lung examination, will continue to hold on systemic steroids for now.  3. Diastolic heart failure. Stable with no exacerbation. Clinically more euvolemic, will continue to hold on diuretics for now.    4. T2Dm. Will allow patient to eat, will continue glucose cover and monitoring, with insulin sliding scale. Capillary glucose 149, 126, 164, 114, 79.   5. HTN. Continue amlodipine 5 mg daily. Will hold on diuretic therapy for now.    DVT prophylaxis: heparin Code Status: full  Family Communication: no family at bedside  Disposition Plan: home    Consultants:     Procedures:     Antimicrobials:    Subjective: Patient feeling better, used bipap part of the night, this am with no dyspnea, cough or wheezing. Patient has been smoking at home, not using home cpap on a regular bases.   Objective: Vitals:   09/08/16 0400 09/08/16 0500 09/08/16 0600 09/08/16 0800  BP: (!) 104/46 106/70    Pulse: 62 95 89   Resp: 18 18 14    Temp: (!) 97.4 F (36.3 C)   98.1 F (36.7 C)  TempSrc: Oral   Oral  SpO2: 96% (!) 87% 100%   Weight:  (!) 161.3 kg (355 lb 9.6 oz)    Height:        Intake/Output Summary (Last 24 hours) at 09/08/16 8119 Last data filed at 09/08/16 0100  Gross per 24 hour  Intake  0 ml  Output             1550 ml  Net            -1550 ml   Filed Weights   09/06/16 2143 09/07/16 0402 09/08/16 0500  Weight: (!) 163.3 kg (360 lb) (!) 161.3 kg (355 lb 9.6 oz) (!) 161.3 kg (355 lb 9.6 oz)    Examination:  General exam: deconditioned E ENT: mild pallor, no icterus, oral mucosa moist.  Respiratory system: Decreased breath sounds bilaterally, no  wheezing, rales or rhonchi.  Cardiovascular system: S1 & S2 heard, RRR. No JVD, murmurs, rubs, gallops or clicks. Non pitting edema. Gastrointestinal system: Abdomen is nondistended, soft and nontender. No organomegaly or masses felt. Normal bowel sounds heard. Central nervous system: Alert and oriented. No focal neurological deficits. Extremities: Symmetric 5 x 5 power. Skin: No rashes, lesions or ulcers     Data Reviewed: I have personally reviewed following labs and imaging studies  CBC:  Recent Labs Lab 09/07/16 0034  WBC 8.6  NEUTROABS 5.5  HGB 11.3*  HCT 36.5*  MCV 88.2  PLT 161   Basic Metabolic Panel:  Recent Labs Lab 09/07/16 0034 09/08/16 0606  NA 142 140  K 4.0 4.5  CL 102 98*  CO2 32 33*  GLUCOSE 109* 107*  BUN 14 14  CREATININE 0.85 0.69  CALCIUM 8.8* 9.0   GFR: Estimated Creatinine Clearance: 184.8 mL/min (by C-G formula based on SCr of 0.69 mg/dL). Liver Function Tests: No results for input(s): AST, ALT, ALKPHOS, BILITOT, PROT, ALBUMIN in the last 168 hours. No results for input(s): LIPASE, AMYLASE in the last 168 hours. No results for input(s): AMMONIA in the last 168 hours. Coagulation Profile: No results for input(s): INR, PROTIME in the last 168 hours. Cardiac Enzymes:  Recent Labs Lab 09/07/16 0034  TROPONINI <0.03   BNP (last 3 results) No results for input(s): PROBNP in the last 8760 hours. HbA1C: No results for input(s): HGBA1C in the last 72 hours. CBG:  Recent Labs Lab 09/07/16 0807 09/07/16 1134 09/07/16 1632 09/07/16 2135 09/08/16 0738  GLUCAP 121* 149* 126* 164* 114*   Lipid Profile: No results for input(s): CHOL, HDL, LDLCALC, TRIG, CHOLHDL, LDLDIRECT in the last 72 hours. Thyroid Function Tests:  Recent Labs  09/07/16 0611  TSH 1.287   Anemia Panel: No results for input(s): VITAMINB12, FOLATE, FERRITIN, TIBC, IRON, RETICCTPCT in the last 72 hours. Sepsis Labs: No results for input(s): PROCALCITON,  LATICACIDVEN in the last 168 hours.  Recent Results (from the past 240 hour(s))  MRSA PCR Screening     Status: Abnormal   Collection Time: 09/07/16  3:50 AM  Result Value Ref Range Status   MRSA by PCR POSITIVE (A) NEGATIVE Final    Comment:        The GeneXpert MRSA Assay (FDA approved for NASAL specimens only), is one component of a comprehensive MRSA colonization surveillance program. It is not intended to diagnose MRSA infection nor to guide or monitor treatment for MRSA infections. RESULT CALLED TO, READ BACK BY AND VERIFIED WITH: MOTLEY J. AT 0960 ON [id number] BY THOMPSON S.           Radiology Studies: Dg Chest 2 View  Result Date: 09/06/2016 CLINICAL DATA:  Shortness of breath and cough EXAM: CHEST  2 VIEW COMPARISON:  Chest radiograph 07/03/2016 FINDINGS: Unchanged cardiomegaly. Shallow lung inflation with mildly prominent interstitial opacities suggesting pulmonary vascular congestion. No pleural effusion or pneumothorax. No focal consolidation.  IMPRESSION: Cardiomegaly and moderate pulmonary vascular congestion. Electronically Signed   By: Ulyses Jarred M.D.   On: 09/06/2016 22:17        Scheduled Meds: . amLODipine  5 mg Oral Daily  . atorvastatin  40 mg Oral Daily  . Chlorhexidine Gluconate Cloth  6 each Topical Q0600  . heparin  7,500 Units Subcutaneous Q8H  . insulin aspart  0-20 Units Subcutaneous TID AC & HS  . ipratropium-albuterol  3 mL Nebulization Q6H  . mupirocin ointment  1 application Nasal BID  . pantoprazole  40 mg Oral Daily  . potassium chloride SA  20 mEq Oral TID  . sodium chloride flush  3 mL Intravenous Q12H   Continuous Infusions:   LOS: 1 day     Wilmary Levit Gerome Apley, MD [facility] Pager [phone]  If 7PM-7AM, please contact night-coverage [email or web address] Password [facility] 09/08/2016, 8:33 AM
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
## 2016-09-08 NOTE — Care Management Note (Addendum)
Case Management Note  Patient Details  Name: EMIEL KIELTY MRN: [id number] Date of Birth: 08-Mar-1980  Subjective/Objective:    Patient adm with acute respiratory failure with hypercapnia. He lives with his mother and ind with ADL's.  Patient and mother state that he has oxygen, neb machine, and CPAP/Bipap at home. Mother states that oxygen is provided by [facility]. Neb machine and CPAP are provided by [employer].  He has PCP, mother drives him to appointments. He has Medicaid and reports no issues affording medications.                         Action/Plan: CM discussed CPAP machine with [employer]. Patient currently has CPAP since he couldn't tolerate the pressure of Bipap (24/19). Per [employer], his current machine can be switched back to Bipap, they will need orders with settings. MD notified    Expected Discharge Date:  09/10/16               Expected Discharge Plan:  Home/Self Care  In-House Referral:     Discharge planning Services  CM Consult  Post Acute Care Choice:    Choice offered to:     DME Arranged:    DME Agency:     HH Arranged:    HH Agency:     Status of Service:  In process, will continue to follow  If discussed at Long Length of Stay Meetings, dates discussed:    Additional Comments:  Azelia Reiger, Chauncey Reading, RN 09/08/2016, 1:58 PM

## 2016-09-09 ENCOUNTER — Encounter (HOSPITAL_COMMUNITY): Payer: Self-pay | Admitting: Radiology

## 2016-09-09 LAB — BLOOD GAS, ARTERIAL
Acid-Base Excess: 4.1 mmol/L — ABNORMAL HIGH (ref 0.0–2.0)
Acid-Base Excess: 4.7 mmol/L — ABNORMAL HIGH (ref 0.0–2.0)
Bicarbonate: 26.3 mmol/L (ref 20.0–28.0)
Bicarbonate: 27.7 mmol/L (ref 20.0–28.0)
Delivery systems: POSITIVE
Delivery systems: POSITIVE
Drawn by: 22223
Drawn by: 221791
Expiratory PAP: 5
Expiratory PAP: 7
FIO2: 45
FIO2: 40
Inspiratory PAP: 20
Inspiratory PAP: 16
O2 Saturation: 97.1 %
O2 Saturation: 97.1 %
Patient temperature: 37
RATE: 12 resp/min
pCO2 arterial: 72.2 mmHg (ref 32.0–48.0)
pCO2 arterial: 55.8 mmHg — ABNORMAL HIGH (ref 32.0–48.0)
pH, Arterial: 7.253 — ABNORMAL LOW (ref 7.350–7.450)
pH, Arterial: 7.35 (ref 7.350–7.450)
pO2, Arterial: 110 mmHg — ABNORMAL HIGH (ref 83.0–108.0)
pO2, Arterial: 98.8 mmHg (ref 83.0–108.0)

## 2016-09-09 LAB — BASIC METABOLIC PANEL
Anion gap: 9 (ref 5–15)
BUN: 19 mg/dL (ref 6–20)
CO2: 34 mmol/L — ABNORMAL HIGH (ref 22–32)
Calcium: 8.9 mg/dL (ref 8.9–10.3)
Chloride: 97 mmol/L — ABNORMAL LOW (ref 101–111)
Creatinine, Ser: 0.85 mg/dL (ref 0.61–1.24)
GFR calc Af Amer: >60 mL/min (ref >60)
GFR calc non Af Amer: >60 mL/min (ref >60)
Glucose, Bld: 105 mg/dL — ABNORMAL HIGH (ref 65–99)
Potassium: 5 mmol/L (ref 3.5–5.1)
Sodium: 140 mmol/L (ref 135–145)

## 2016-09-09 LAB — GLUCOSE, CAPILLARY
Glucose-Capillary: 97 mg/dL (ref 65–99)
Glucose-Capillary: 85 mg/dL (ref 65–99)
Glucose-Capillary: 106 mg/dL — ABNORMAL HIGH (ref 65–99)
Glucose-Capillary: 116 mg/dL — ABNORMAL HIGH (ref 65–99)

## 2016-09-09 MED ORDER — IPRATROPIUM-ALBUTEROL 0.5-2.5 (3) MG/3ML IN SOLN
3.0000 mL | Freq: Three times a day (TID) | RESPIRATORY_TRACT | Status: DC
  Administered 2016-09-10 – 2016-09-11 (×4): 3 mL via RESPIRATORY_TRACT
  Filled 2016-09-09 (×4): qty 3

## 2016-09-09 MED ORDER — SALINE SPRAY 0.65 % NA SOLN: 1.0000 | NASAL | Status: DC | PRN

## 2016-09-09 NOTE — Progress Notes (Signed)
PROGRESS NOTE    Shawn Ramsey  IRJ:[id number] DOB: 10/23/1980 DOA: 09/06/2016 PCP: Fayrene Helper, MD    Brief Narrative:  36 year old male who presented with dyspnea. Patient is known to have asthma, morbid obesity, obesity hypoventilation syndrome, severe obstructive sleep apnea, COPD and ongoing tobacco abuse. Presented with worsening dyspnea for last 7 days, unable to sleep at night, was not able to use his CPAP. Initial physical examination blood pressure 142/93, heart rate 95, respiratory 21-23, oxygen saturation 98%. Mucous membranes were moist, his lungs were clear to auscultation bilaterally, decreased ventilation, heart S1-S2 present and rhythmic, his abdomen was protuberant with no rebound guarding or organomegaly, positive nonpitting edema bilateral lower extremities. Sodium 142, potassium 4.0, chloride 102, bicarbonate 32, glucose 109, BUN 14, creatinine 0.85, white count 8.6, hemoglobin 11.3,, hematocrit 36.5, platelets 345. Acute blood gas on 3 L flow per nasal cannula, pH 7.28, PCO2 67.5, PO2 89.1, bicarbonate 27, oxygen saturation 94.9%. Chest film with increased lung markings bilaterally, no effusions, infiltrates or pneumothorax. EKG with normal sinus rhythm, rate of 98 bpm.   Patient was admitted to the hospital with working diagnosis of acute hypercapnic respiratory failure.   Assessment & Plan:   Principal Problem:   Acute respiratory failure with hypercapnia (HCC) Active Problems:   Morbid obesity with BMI of 60.0-69.9, adult (HCC)   OSA (obstructive sleep apnea)   COPD exacerbation (HCC)   Cigarette nicotine dependence   Medical non-compliance   Acute respiratory failure ([place])   1. Acute on chronic hypercapnic respiratory failure. Patient complained of excessive pressure on bipap last night, will titrate pressure during the day to achieve better comfort and adequate ventilation. Patient needs home bipap to prevent recurrent hypercapnic respiratory failure.  Will check abg in 2 hours. Continue bronchodilator therapy.   2. Asthma/ COPD, with chronic hypoxic respiratory failure. Continue with bronchodilator therapy, no indication for systemic steroids at this point.   3. Diastolic heart failure. Stable with no exacerbation. Euvolemic, holding diuretics.    4. T2Dm. Capillary glucose 79, 94, 153, 97.   5. HTN. Amlodipine 5 mg daily, with good blood pressure control.    DVT prophylaxis:heparin Code Status:full  Family Communication:no family at bedside  Disposition Plan:home    Consultants:    Procedures:    Antimicrobials:     Subjective: Patient complained of high pressure bipap, overnight, no chest pain or dyspnea, no wheezing, no nausea or vomiting. Able to ambulate with physical therapy.   Objective: Vitals:   09/09/16 0400 09/09/16 0445 09/09/16 0500 09/09/16 0759  BP:      Pulse:  78    Resp:  15    Temp: 98 F (36.7 C)   98.1 F (36.7 C)  TempSrc: Oral   Oral  SpO2:  98%    Weight:   (!) 161.3 kg (355 lb 9.6 oz)   Height:       No intake or output data in the 24 hours ending 09/09/16 0836 Filed Weights   09/07/16 0402 09/08/16 0500 09/09/16 0500  Weight: (!) 161.3 kg (355 lb 9.6 oz) (!) 161.3 kg (355 lb 9.6 oz) (!) 161.3 kg (355 lb 9.6 oz)    Examination:  General exam: not in pain or dyspnea E ENT: no pallor, no icterus, oral mucosa moist.  Respiratory system: No wheezing, rales or rhonchi. Respiratory effort normal. Cardiovascular system: S1 & S2 heard, RRR. No JVD, murmurs, rubs, gallops or clicks. No pedal edema. Gastrointestinal system: Abdomen is protuberant, nondistended, soft and  nontender. No organomegaly or masses felt. Normal bowel sounds heard. Central nervous system: Alert and oriented. No focal neurological deficits. Extremities: Symmetric 5 x 5 power. Skin: No rashes, lesions or ulcers   Data Reviewed: I have personally reviewed following labs and imaging  studies  CBC:  Recent Labs Lab 09/07/16 0034  WBC 8.6  NEUTROABS 5.5  HGB 11.3*  HCT 36.5*  MCV 88.2  PLT 053   Basic Metabolic Panel:  Recent Labs Lab 09/07/16 0034 09/08/16 0606 09/09/16 0555  NA 142 140 140  K 4.0 4.5 5.0  CL 102 98* 97*  CO2 32 33* 34*  GLUCOSE 109* 107* 105*  BUN 14 14 19   CREATININE 0.85 0.69 0.85  CALCIUM 8.8* 9.0 8.9   GFR: Estimated Creatinine Clearance: 174 mL/min (by C-G formula based on SCr of 0.85 mg/dL). Liver Function Tests: No results for input(s): AST, ALT, ALKPHOS, BILITOT, PROT, ALBUMIN in the last 168 hours. No results for input(s): LIPASE, AMYLASE in the last 168 hours. No results for input(s): AMMONIA in the last 168 hours. Coagulation Profile: No results for input(s): INR, PROTIME in the last 168 hours. Cardiac Enzymes:  Recent Labs Lab 09/07/16 0034  TROPONINI <0.03   BNP (last 3 results) No results for input(s): PROBNP in the last 8760 hours. HbA1C: No results for input(s): HGBA1C in the last 72 hours. CBG:  Recent Labs Lab 09/08/16 0738 09/08/16 1122 09/08/16 1720 09/08/16 2127 09/09/16 0740  GLUCAP 114* 79 94 153* 97   Lipid Profile: No results for input(s): CHOL, HDL, LDLCALC, TRIG, CHOLHDL, LDLDIRECT in the last 72 hours. Thyroid Function Tests:  Recent Labs  09/07/16 0611  TSH 1.287   Anemia Panel: No results for input(s): VITAMINB12, FOLATE, FERRITIN, TIBC, IRON, RETICCTPCT in the last 72 hours. Sepsis Labs: No results for input(s): PROCALCITON, LATICACIDVEN in the last 168 hours.  Recent Results (from the past 240 hour(s))  MRSA PCR Screening     Status: Abnormal   Collection Time: 09/07/16  3:50 AM  Result Value Ref Range Status   MRSA by PCR POSITIVE (A) NEGATIVE Final    Comment:        The GeneXpert MRSA Assay (FDA approved for NASAL specimens only), is one component of a comprehensive MRSA colonization surveillance program. It is not intended to diagnose MRSA infection nor to  guide or monitor treatment for MRSA infections. RESULT CALLED TO, READ BACK BY AND VERIFIED WITH: MOTLEY J. AT [id number] ON [id number] BY THOMPSON S.           Radiology Studies: No results found.      Scheduled Meds: . amLODipine  5 mg Oral Daily  . atorvastatin  40 mg Oral Daily  . Chlorhexidine Gluconate Cloth  6 each Topical Q0600  . heparin  7,500 Units Subcutaneous Q8H  . insulin aspart  0-20 Units Subcutaneous TID AC & HS  . ipratropium-albuterol  3 mL Nebulization Q6H  . mupirocin ointment  1 application Nasal BID  . pantoprazole  40 mg Oral Daily  . sodium chloride flush  3 mL Intravenous Q12H   Continuous Infusions:   LOS: 2 days      Alekzander Cardell Gerome Apley, MD [facility] Pager [phone]  If 7PM-7AM, please contact night-coverage [email or web address] Password [email or web address] 09/09/2016, 8:36 AM

## 2016-09-09 NOTE — Care Management (Signed)
CM discussed pulmonary rehab with [facility]. Patient is not eligible for pulmonary rehab as Medicaid will not cover.

## 2016-09-10 LAB — GLUCOSE, CAPILLARY
Glucose-Capillary: 97 mg/dL (ref 65–99)
Glucose-Capillary: 127 mg/dL — ABNORMAL HIGH (ref 65–99)
Glucose-Capillary: 113 mg/dL — ABNORMAL HIGH (ref 65–99)
Glucose-Capillary: 162 mg/dL — ABNORMAL HIGH (ref 65–99)

## 2016-09-10 LAB — BLOOD GAS, ARTERIAL
Acid-Base Excess: 4.4 mmol/L — ABNORMAL HIGH (ref 0.0–2.0)
Bicarbonate: 26.6 mmol/L (ref 20.0–28.0)
Delivery systems: POSITIVE
Drawn by: 22223
Expiratory PAP: 5
FIO2: 40
Inspiratory PAP: 20
O2 Saturation: 96.8 %
pCO2 arterial: 72 mmHg (ref 32.0–48.0)
pH, Arterial: 7.257 — ABNORMAL LOW (ref 7.350–7.450)
pO2, Arterial: 103 mmHg (ref 83.0–108.0)

## 2016-09-10 LAB — BASIC METABOLIC PANEL
Anion gap: 9 (ref 5–15)
BUN: 17 mg/dL (ref 6–20)
CO2: 33 mmol/L — ABNORMAL HIGH (ref 22–32)
Calcium: 8.7 mg/dL — ABNORMAL LOW (ref 8.9–10.3)
Chloride: 101 mmol/L (ref 101–111)
Creatinine, Ser: 0.67 mg/dL (ref 0.61–1.24)
GFR calc Af Amer: >60 mL/min (ref >60)
GFR calc non Af Amer: >60 mL/min (ref >60)
Glucose, Bld: 112 mg/dL — ABNORMAL HIGH (ref 65–99)
Potassium: 4.1 mmol/L (ref 3.5–5.1)
Sodium: 143 mmol/L (ref 135–145)

## 2016-09-10 MED ORDER — LIVING WELL WITH DIABETES BOOK
Freq: Once | Status: AC
  Administered 2016-09-10: 17:00:00
  Filled 2016-09-10: qty 1

## 2016-09-10 MED ORDER — TORSEMIDE 20 MG PO TABS
20.0000 mg | ORAL_TABLET | Freq: Every day | ORAL | Status: DC
  Administered 2016-09-10 – 2016-09-11 (×2): 20 mg via ORAL
  Filled 2016-09-10 (×2): qty 1

## 2016-09-10 NOTE — Progress Notes (Signed)
PROGRESS NOTE    Shawn Ramsey  QBH:[id number] DOB: Nov 29, 1980 DOA: 09/06/2016 PCP: Fayrene Helper, MD     Brief Narrative: 36 year old male who presented with dyspnea. Patient is known to have asthma, morbid obesity, obesity hypoventilation syndrome, severe obstructive sleep apnea, COPD and ongoing tobacco abuse. Presented with worsening dyspnea for last 7 days, unable to sleep at night, was not able to use his CPAP. Initial physical examination blood pressure 142/93, heart rate 95, respiratory 21-23, oxygen saturation 98%. Mucous membranes were moist, his lungs were clear to auscultation bilaterally, decreased ventilation, heart S1-S2 present and rhythmic, his abdomen was protuberant with no rebound guarding or organomegaly, positive nonpitting edema bilateral lower extremities. Sodium 142, potassium 4.0, chloride 102, bicarbonate 32, glucose 109, BUN 14, creatinine 0.85, white count 8.6, hemoglobin 11.3,, hematocrit 36.5, platelets 345. Acute blood gas on 3 L flow per nasal cannula, pH 7.28, PCO2 67.5, PO2 89.1, bicarbonate 27, oxygen saturation 94.9%. Chest film with increased lung markings bilaterally, no effusions, infiltrates or pneumothorax. EKG with normal sinus rhythm, rate of 98 bpm.   Patient was admitted to the hospital with working diagnosis of acute hypercapnic respiratory failure   Assessment & Plan:   Principal Problem:   Acute respiratory failure with hypercapnia (HCC) Active Problems:   Morbid obesity with BMI of 60.0-69.9, adult (HCC)   OSA (obstructive sleep apnea)   COPD exacerbation (HCC)   Cigarette nicotine dependence   Medical non-compliance   Acute respiratory failure ([place])   1. Acute on chronic hypercapnic respiratory failure. Reported desaturation last night on bipap, changed pressures to 20/5. This am with recurrent hypercapnia and respiratory acidosis, will recommend to change pressure to 18/8 and increase fi02 if necessary. Respiratory failure  mainly due to hypoventilation, patient should be able to tolerate high fio2 if needed.   2. Asthma/ COPD, with chronic hypoxic respiratory failure. Bronchodilator therapy,   3. Diastolic heart failure. Stable with no exacerbation. Euvolemic, will resume diuretic therapy at a lower dose.   4. T2Dm. Capillary glucose 106, 97, 127   5. HTN. Continue with Amlodipine 5 mg daily.    DVT prophylaxis:heparin Code Status:full  Family Communication:no family at bedside  Disposition Plan:home    Consultants:    Procedures:    Antimicrobials:    Subjective: Patient had significant desaturation on 16/5 and 40% Fi02. Had to be increased to 20/5. Patient requesting to use his home mask. No nausea or vomiting. No chest pain. No significant dyspnea during the day.   Objective: Vitals:   09/10/16 0000 09/10/16 0003 09/10/16 0400 09/10/16 0445  BP:      Pulse:  92  89  Resp:  19  16  Temp: 98.2 F (36.8 C)  98.7 F (37.1 C)   TempSrc: Axillary  Axillary   SpO2:  96%  98%  Weight:   (!) 160.8 kg (354 lb 8 oz)   Height:        Intake/Output Summary (Last 24 hours) at 09/10/16 0802 Last data filed at 09/09/16 2153  Gross per 24 hour  Intake             1035 ml  Output                0 ml  Net             1035 ml   Filed Weights   09/08/16 0500 09/09/16 0500 09/10/16 0400  Weight: (!) 161.3 kg (355 lb 9.6 oz) (!) 161.3 kg (355  lb 9.6 oz) (!) 160.8 kg (354 lb 8 oz)    Examination:  General exam: not in pain or dyspnea E ENT. no pallor or icterus Respiratory system: Decreased breath sound, no wheezing, rales or rhonchi. clear to auscultation. Respiratory effort normal. Cardiovascular system: S1 & S2 heard, RRR. No JVD, murmurs, rubs, gallops or clicks. No pedal edema. Gastrointestinal system: Abdomen is nondistended, soft and nontender. No organomegaly or masses felt. Normal bowel sounds heard. Central nervous system: Alert and oriented. No focal  neurological deficits. Extremities: Symmetric 5 x 5 power. Skin: No rashes, lesions or ulcers      Data Reviewed: I have personally reviewed following labs and imaging studies  CBC:  Recent Labs Lab 09/07/16 0034  WBC 8.6  NEUTROABS 5.5  HGB 11.3*  HCT 36.5*  MCV 88.2  PLT 315   Basic Metabolic Panel:  Recent Labs Lab 09/07/16 0034 09/08/16 0606 09/09/16 0555 09/10/16 0425  NA 142 140 140 143  K 4.0 4.5 5.0 4.1  CL 102 98* 97* 101  CO2 32 33* 34* 33*  GLUCOSE 109* 107* 105* 112*  BUN 14 14 19 17   CREATININE 0.85 0.69 0.85 0.67  CALCIUM 8.8* 9.0 8.9 8.7*   GFR: Estimated Creatinine Clearance: 184.5 mL/min (by C-G formula based on SCr of 0.67 mg/dL). Liver Function Tests: No results for input(s): AST, ALT, ALKPHOS, BILITOT, PROT, ALBUMIN in the last 168 hours. No results for input(s): LIPASE, AMYLASE in the last 168 hours. No results for input(s): AMMONIA in the last 168 hours. Coagulation Profile: No results for input(s): INR, PROTIME in the last 168 hours. Cardiac Enzymes:  Recent Labs Lab 09/07/16 0034  TROPONINI <0.03   BNP (last 3 results) No results for input(s): PROBNP in the last 8760 hours. HbA1C: No results for input(s): HGBA1C in the last 72 hours. CBG:  Recent Labs Lab 09/08/16 2127 09/09/16 0740 09/09/16 1200 09/09/16 1709 09/09/16 2133  GLUCAP 153* 97 85 106* 116*   Lipid Profile: No results for input(s): CHOL, HDL, LDLCALC, TRIG, CHOLHDL, LDLDIRECT in the last 72 hours. Thyroid Function Tests: No results for input(s): TSH, T4TOTAL, FREET4, T3FREE, THYROIDAB in the last 72 hours. Anemia Panel: No results for input(s): VITAMINB12, FOLATE, FERRITIN, TIBC, IRON, RETICCTPCT in the last 72 hours. Sepsis Labs: No results for input(s): PROCALCITON, LATICACIDVEN in the last 168 hours.  Recent Results (from the past 240 hour(s))  MRSA PCR Screening     Status: Abnormal   Collection Time: 09/07/16  3:50 AM  Result Value Ref Range  Status   MRSA by PCR POSITIVE (A) NEGATIVE Final    Comment:        The GeneXpert MRSA Assay (FDA approved for NASAL specimens only), is one component of a comprehensive MRSA colonization surveillance program. It is not intended to diagnose MRSA infection nor to guide or monitor treatment for MRSA infections. RESULT CALLED TO, READ BACK BY AND VERIFIED WITH: MOTLEY J. AT [id number] ON [id number] BY THOMPSON S.           Radiology Studies: No results found.      Scheduled Meds: . amLODipine  5 mg Oral Daily  . atorvastatin  40 mg Oral Daily  . Chlorhexidine Gluconate Cloth  6 each Topical Q0600  . heparin  7,500 Units Subcutaneous Q8H  . insulin aspart  0-20 Units Subcutaneous TID AC & HS  . ipratropium-albuterol  3 mL Nebulization TID  . mupirocin ointment  1 application Nasal BID  . pantoprazole  40  mg Oral Daily  . sodium chloride flush  3 mL Intravenous Q12H   Continuous Infusions:   LOS: 3 days       Mauricio Gerome Apley, MD [facility] Pager [phone]  If 7PM-7AM, please contact night-coverage [email or web address] Password [email or web address] 09/10/2016, 8:02 AM

## 2016-09-10 NOTE — Progress Notes (Signed)
Referral received.  Patient requesting more information regarding diet and diabetes.  Last A1C=6.6%.  Ordered Living well with diabetes booklet for patient and Outpatient diabetes education per protocol.  Discussed with RN.  Patient being transferred out of ICU today.   Thanks, Adah Perl, RN, BC-ADM Inpatient Diabetes Coordinator Pager [phone] (8a-5p)

## 2016-09-11 DIAGNOSIS — E669 Obesity, unspecified: Secondary | ICD-10-CM

## 2016-09-11 DIAGNOSIS — E1169 Type 2 diabetes mellitus with other specified complication: Secondary | ICD-10-CM

## 2016-09-11 DIAGNOSIS — E662 Morbid (severe) obesity with alveolar hypoventilation: Secondary | ICD-10-CM

## 2016-09-11 LAB — BLOOD GAS, ARTERIAL
Acid-Base Excess: 6.1 mmol/L — ABNORMAL HIGH (ref 0.0–2.0)
Bicarbonate: 28.9 mmol/L — ABNORMAL HIGH (ref 20.0–28.0)
Drawn by: 22223
FIO2: 21
O2 Saturation: 90.4 %
pCO2 arterial: 55.5 mmHg — ABNORMAL HIGH (ref 32.0–48.0)
pH, Arterial: 7.369 (ref 7.350–7.450)
pO2, Arterial: 63.5 mmHg — ABNORMAL LOW (ref 83.0–108.0)

## 2016-09-11 LAB — GLUCOSE, CAPILLARY: Glucose-Capillary: 121 mg/dL — ABNORMAL HIGH (ref 65–99)

## 2016-09-11 MED ORDER — POTASSIUM CHLORIDE CRYS ER 20 MEQ PO TBCR: 20.0000 meq | EXTENDED_RELEASE_TABLET | Freq: Every day | ORAL | 0 refills | Status: DC

## 2016-09-11 MED ORDER — IPRATROPIUM-ALBUTEROL 0.5-2.5 (3) MG/3ML IN SOLN: 3.0000 mL | Freq: Four times a day (QID) | RESPIRATORY_TRACT | 0 refills | Status: DC | PRN

## 2016-09-11 MED ORDER — TORSEMIDE 20 MG PO TABS: 20.0000 mg | ORAL_TABLET | Freq: Every day | ORAL | 0 refills | Status: DC

## 2016-09-11 MED ORDER — IPRATROPIUM-ALBUTEROL 0.5-2.5 (3) MG/3ML IN SOLN: 3.0000 mL | Freq: Two times a day (BID) | RESPIRATORY_TRACT | Status: DC

## 2016-09-11 MED ORDER — IPRATROPIUM-ALBUTEROL 0.5-2.5 (3) MG/3ML IN SOLN: 3.0000 mL | Freq: Four times a day (QID) | RESPIRATORY_TRACT | Status: DC

## 2016-09-11 NOTE — Progress Notes (Signed)
CM received cll from MD stating pt will go home with a new prescription for different settings for his BiPap.  Cm called pt who handed the phone to his mother who states the BiPap is from [place].  Pt's mother states she will bring the BiPap to [facility] with the prescription to have [place] change the settings.  No other CM needs were communicated.

## 2016-09-11 NOTE — Progress Notes (Signed)
Patient sleeps on his back sitting up in reverse  [place] position. His breathing is very shallow due to his body size. BiPAP is running at 18 / 8 as requested with his mask in place. So far have been able to turn his oxygen down as his saturation has been 93 - 95. Decreased the rate to 8. This has been done because since he is so large , BiPAP can only help ventilate him when he breaths. If rate is increased it will auto cycle but he still doesn't get breath ( mask inflates pushing away from face as machine cycles through breath) Will continue to monitor. Will be surprised if morning ABG is much better. Suspect he is about  As good as he can be with BIPAP.

## 2016-09-11 NOTE — Discharge Summary (Signed)
Physician Discharge Summary  Shawn Ramsey MCN:[id number] DOB: 13-Jan-1981 DOA: 09/06/2016  PCP: Fayrene Helper, MD  Admit date: 09/06/2016 Discharge date: 09/11/2016  Admitted From: Home  Disposition:  Home   Recommendations for Outpatient Follow-up:  1. Follow up with PCP in 1- week 2. Home ventilator has been changed to [place]: No  Equipment/Devices: Home Bipap                                      Home 02                                      Nebulizer machine  Discharge Condition: stable  CODE STATUS: Full  Diet recommendation: Heart healthy/ diabeteic   Brief/Interim Summary: 36 year old male who presented with dyspnea. Patient is known to have asthma, morbid obesity, obesity hypoventilation syndrome, severe obstructive sleep apnea, COPD and ongoing tobacco abuse. Presented with worsening dyspnea for last 7 days, unable to sleep at night, was not able to use his CPAP. Initial physical examination blood pressure 142/93, heart rate 95, respiratory 21-23, oxygen saturation 98%. Mucous membranes were moist, his lungs were clear to auscultation bilaterally, decreased ventilation, heart S1-S2 present and rhythmic, his abdomen was protuberant with no rebound guarding or organomegaly, positive nonpitting edema bilateral lower extremities. Sodium 142, potassium 4.0, chloride 102, bicarbonate 32, glucose 109, BUN 14, creatinine 0.85, white count 8.6, hemoglobin 11.3,, hematocrit 36.5, platelets 345. Arterial blood gas on 3 L flow per nasal cannula, pH 7.28, PCO2 67.5, PO2 89.1, bicarbonate 27, oxygen saturation 94.9%. Chest film with increased lung markings bilaterally, no effusions, infiltrates or pneumothorax. EKG with normal sinus rhythm, rate of 98 bpm.   Patient was admitted to the hospital with working diagnosis of acute on chronic hypercapnic respiratory failure.   1. Acute on chronic hypercapnic respiratory failure. Patient was admitted to the stepdown unit, he was  placed on noninvasive mechanical ventilation, initially he required high pressures, 20/5 to overcome his respiratory failure. He required titration of pressures on the BiPAP, to achieve comfort and compliance, he was able to tolerate comfortably 18/8, with good response, discharge serum pH 7.36. Will recommend to continue use BiPAP mode home, aggressive weight reduction, and a close follow-up as an outpatient with the sleep center. If further problems using his home device, he will need to have a fit testing at the sleep Center. Social services have been contacted in order to have his home machine changed to 18/8. FiO2 40%.   2. Asthma/COPD, chronic hypoxic respiratory failure. Patient received aggressive bronchodilator therapy, no significant wheezing, no clinical signs of exacerbation.  3. Diastolic heart failure. Chronic with no exacerbation. Continue amlodipine for blood pressure control, will decrease torsemide to once daily, to avoid flexion abnormalities. Close follow-up kidney function as an outpatient, his potassium supplements also have been decreased to once daily.  4. Type 2 diabetes mellitus. Capillary glucose remained well-controlled, patient was placed on insulin sliding-scale, during his hospitalization. Plan to resume metformin at discharge.  5. Hypertension. Continue amlodipine 5 g daily.   6. Tobacco abuse. Smoking cessation counseling.  Discharge Diagnoses:  Principal Problem:   Acute respiratory failure with hypercapnia (HCC) Active Problems:   Morbid obesity with BMI of 60.0-69.9, adult (HCC)   OSA (obstructive sleep apnea)   COPD exacerbation ([place])  Cigarette nicotine dependence   Medical non-compliance   Acute respiratory failure [employer])    Discharge Instructions  Discharge Instructions    Ambulatory referral to Nutrition and Diabetic Education    Complete by:  As directed    F/U in [place]-A1C 6.6%- interested in more information regarding diet      Allergies as of 09/11/2016   No Known Allergies     Medication List    STOP taking these medications   cephALEXin 500 MG capsule Commonly known as:  KEFLEX     TAKE these medications   acetaminophen 500 MG tablet Commonly known as:  TYLENOL Take 1,000 mg by mouth every 6 (six) hours as needed for mild pain, moderate pain or headache.   albuterol 108 (90 Base) MCG/ACT inhaler Commonly known as:  PROVENTIL HFA;VENTOLIN HFA Inhale 2 puffs into the lungs every 6 (six) hours as needed for wheezing.   amLODipine 5 MG tablet Commonly known as:  NORVASC Take 1 tablet (5 mg total) by mouth daily.   atorvastatin 40 MG tablet Commonly known as:  LIPITOR Take 1 tablet (40 mg total) by mouth daily.   betamethasone dipropionate 0.05 % cream Commonly known as:  DIPROLENE Apply topically 2 (two) times daily. Apply sparingly twice daily to rash for 7 days , then as needed   ipratropium-albuterol 0.5-2.5 (3) MG/3ML Soln Commonly known as:  DUONEB Take 3 mLs by nebulization every 6 (six) hours as needed (for shortness of breath).   metFORMIN 500 MG 24 hr tablet Commonly known as:  GLUCOPHAGE XR Take 1 tablet (500 mg total) by mouth daily with breakfast.   mometasone-formoterol 100-5 MCG/ACT Aero Commonly known as:  DULERA Inhale 2 puffs into the lungs 2 (two) times daily.   montelukast 10 MG tablet Commonly known as:  SINGULAIR Take 1 tablet (10 mg total) by mouth at bedtime.   omeprazole 20 MG capsule Commonly known as:  PRILOSEC Take 20 mg by mouth daily.   potassium chloride SA 20 MEQ tablet Commonly known as:  K-DUR,KLOR-CON Take 1 tablet (20 mEq total) by mouth daily. What changed:  when to take this   torsemide 20 MG tablet Commonly known as:  DEMADEX Take 1 tablet (20 mg total) by mouth daily.            Durable Medical Equipment        Start     Ordered   09/11/16 0000  DME Nebulizer machine    Question:  Patient needs a nebulizer to treat with the  following condition  Answer:  Asthma   09/11/16 1007      No Known Allergies  Consultations:     Procedures/Studies: Dg Chest 2 View  Result Date: 09/06/2016 CLINICAL DATA:  Shortness of breath and cough EXAM: CHEST  2 VIEW COMPARISON:  Chest radiograph 07/03/2016 FINDINGS: Unchanged cardiomegaly. Shallow lung inflation with mildly prominent interstitial opacities suggesting pulmonary vascular congestion. No pleural effusion or pneumothorax. No focal consolidation. IMPRESSION: Cardiomegaly and moderate pulmonary vascular congestion. Electronically Signed   By: Ulyses Jarred M.D.   On: 09/06/2016 22:17       Subjective: Patient doing better, tolerated well bipap last night, no nausea or vomiting, no chest pain or dyspnea.   Discharge Exam: Vitals:   09/11/16 0534 09/11/16 0539  BP:  (!) 159/94  Pulse: 98 89  Resp: 15 16  Temp:  98.6 F (37 C)   Vitals:   09/11/16 0042 09/11/16 0534 09/11/16 0539 09/11/16 0801  BP:   [name])  159/94   Pulse: 100 98 89   Resp: (!) 21 15 16    Temp:   98.6 F (37 C)   TempSrc:   Oral   SpO2: 96% 95% 100% 100%  Weight:      Height:        General: Pt is alert, awake, not in acute distress E ENT: no pallor or icterus, oral mucosa moist Cardiovascular: RRR, S1/S2 +, no rubs, no gallops Respiratory: CTA bilaterally, no wheezing, no rhonchi Abdominal: Soft, NT, ND, bowel sounds + Extremities: non pitting edema, no cyanosis    The results of significant diagnostics from this hospitalization (including imaging, microbiology, ancillary and laboratory) are listed below for reference.     Microbiology: Recent Results (from the past 240 hour(s))  MRSA PCR Screening     Status: Abnormal   Collection Time: 09/07/16  3:50 AM  Result Value Ref Range Status   MRSA by PCR POSITIVE (A) NEGATIVE Final    Comment:        The GeneXpert MRSA Assay (FDA approved for NASAL specimens only), is one component of a comprehensive MRSA  colonization surveillance program. It is not intended to diagnose MRSA infection nor to guide or monitor treatment for MRSA infections. RESULT CALLED TO, READ BACK BY AND VERIFIED WITH: MOTLEY J. AT 1411 ON [id number] BY THOMPSON S.       Labs: BNP (last 3 results)  Recent Labs  04/27/16 2307 07/03/16 2300 09/07/16 0034  BNP 20.0 31.0 40.7   Basic Metabolic Panel:  Recent Labs Lab 09/07/16 0034 09/08/16 0606 09/09/16 0555 09/10/16 0425  NA 142 140 140 143  K 4.0 4.5 5.0 4.1  CL 102 98* 97* 101  CO2 32 33* 34* 33*  GLUCOSE 109* 107* 105* 112*  BUN 14 14 19 17   CREATININE 0.85 0.69 0.85 0.67  CALCIUM 8.8* 9.0 8.9 8.7*   Liver Function Tests: No results for input(s): AST, ALT, ALKPHOS, BILITOT, PROT, ALBUMIN in the last 168 hours. No results for input(s): LIPASE, AMYLASE in the last 168 hours. No results for input(s): AMMONIA in the last 168 hours. CBC:  Recent Labs Lab 09/07/16 0034  WBC 8.6  NEUTROABS 5.5  HGB 11.3*  HCT 36.5*  MCV 88.2  PLT 345   Cardiac Enzymes:  Recent Labs Lab 09/07/16 0034  TROPONINI <0.03   BNP: Invalid input(s): POCBNP CBG:  Recent Labs Lab 09/10/16 0802 09/10/16 1140 09/10/16 1629 09/10/16 2059 09/11/16 0820  GLUCAP 97 127* 113* 162* 121*   D-Dimer No results for input(s): DDIMER in the last 72 hours. Hgb A1c No results for input(s): HGBA1C in the last 72 hours. Lipid Profile No results for input(s): CHOL, HDL, LDLCALC, TRIG, CHOLHDL, LDLDIRECT in the last 72 hours. Thyroid function studies No results for input(s): TSH, T4TOTAL, T3FREE, THYROIDAB in the last 72 hours.  Invalid input(s): FREET3 Anemia work up No results for input(s): VITAMINB12, FOLATE, FERRITIN, TIBC, IRON, RETICCTPCT in the last 72 hours. Urinalysis    Component Value Date/Time   COLORURINE COLORLESS (A) 04/29/2016 0925   APPEARANCEUR CLEAR 04/29/2016 0925   LABSPEC 1.009 04/29/2016 0925   PHURINE 6.0 04/29/2016 0925   GLUCOSEU 150 (A)  04/29/2016 0925   HGBUR NEGATIVE 04/29/2016 0925   BILIRUBINUR NEGATIVE 04/29/2016 0925   KETONESUR NEGATIVE 04/29/2016 0925   PROTEINUR NEGATIVE 04/29/2016 0925   UROBILINOGEN 0.2 03/07/2014 0620   NITRITE NEGATIVE 04/29/2016 0925   LEUKOCYTESUR NEGATIVE 04/29/2016 0925   Sepsis Labs Invalid input(s): PROCALCITONIN,  WBC,  Gray Microbiology Recent Results (from the past 240 hour(s))  MRSA PCR Screening     Status: Abnormal   Collection Time: 09/07/16  3:50 AM  Result Value Ref Range Status   MRSA by PCR POSITIVE (A) NEGATIVE Final    Comment:        The GeneXpert MRSA Assay (FDA approved for NASAL specimens only), is one component of a comprehensive MRSA colonization surveillance program. It is not intended to diagnose MRSA infection nor to guide or monitor treatment for MRSA infections. RESULT CALLED TO, READ BACK BY AND VERIFIED WITH: MOTLEY J. AT 1411 ON [id number] BY THOMPSON S.       Time coordinating discharge: 45 minutes  SIGNED:   Tawni Millers, MD  [facility] 09/11/2016, 9:51 AM Pager   If 7PM-7AM, please contact night-coverage [email or web address] Password [email or web address]

## 2016-09-11 NOTE — Progress Notes (Signed)
Patient Iv removed, tolerated well. Discharge instructions given at bedside.

## 2016-09-12 NOTE — Care Management (Signed)
Patient discharged, Saturday 09/10/2016. CM contacted in regards to BiPap. CM contacted [employer], they need order. CM faxed over order with [place] settings.

## 2016-09-13 ENCOUNTER — Telehealth: Payer: Self-pay

## 2016-09-13 NOTE — Telephone Encounter (Signed)
Transition Care Management Follow-up Telephone Call   Date discharged? 09/11/2016   How have you been since you were released from the hospital? Much better, no further SOB   Do you understand why you were in the hospital? yes   Do you understand the discharge instructions? yes   Where were you discharged to? Home   Items Reviewed:  Medications reviewed: yes  Allergies reviewed: yes  Dietary changes reviewed: yes  Referrals reviewed: yes   Functional Questionnaire:   Activities of Daily Living (ADLs):   He states they are independent in the following: ambulation, feeding, continence and toileting States they require assistance with the following: bathing and hygiene, grooming and dressing   Any transportation issues/concerns?: no   Any patient concerns? Yes, patient is requesting a prescription for Belviq to help with weight loss.   Confirmed importance and date/time of follow-up visits scheduled yes  Provider Appointment booked with Dr. [name]  Confirmed with patient if condition begins to worsen call PCP or go to the ER.  Patient was given the office number and encouraged to call back with question or concerns.  : yes

## 2016-09-19 ENCOUNTER — Ambulatory Visit: Payer: Medicaid Other

## 2016-09-19 ENCOUNTER — Telehealth: Payer: Self-pay | Admitting: *Deleted

## 2016-09-19 ENCOUNTER — Ambulatory Visit (INDEPENDENT_AMBULATORY_CARE_PROVIDER_SITE_OTHER): Payer: Medicaid Other | Admitting: Family Medicine

## 2016-09-19 ENCOUNTER — Encounter: Payer: Self-pay | Admitting: Family Medicine

## 2016-09-19 VITALS — BP 110/60 | HR 96 | Temp 97.1°F | Resp 16 | Ht 65.0 in | Wt 356.5 lb

## 2016-09-19 DIAGNOSIS — I10 Essential (primary) hypertension: Secondary | ICD-10-CM | POA: Diagnosis not present

## 2016-09-19 DIAGNOSIS — E669 Obesity, unspecified: Secondary | ICD-10-CM | POA: Diagnosis not present

## 2016-09-19 DIAGNOSIS — E8881 Metabolic syndrome: Secondary | ICD-10-CM | POA: Diagnosis not present

## 2016-09-19 DIAGNOSIS — E1169 Type 2 diabetes mellitus with other specified complication: Secondary | ICD-10-CM

## 2016-09-19 DIAGNOSIS — E785 Hyperlipidemia, unspecified: Secondary | ICD-10-CM

## 2016-09-19 DIAGNOSIS — Z09 Encounter for follow-up examination after completed treatment for conditions other than malignant neoplasm: Secondary | ICD-10-CM

## 2016-09-19 DIAGNOSIS — E876 Hypokalemia: Secondary | ICD-10-CM

## 2016-09-19 NOTE — Patient Instructions (Signed)
F/u in 3 months, call if you need me before  HBA1C and chem 7 and EGFR today  Please keep losing weight and use BiPAP every day  Your heart is stiff , heart failure and you have high pressure  In your lung    Thank you  for choosing [facility]. We consider it a privelige to serve you.  Delivering excellent health care in a caring and  compassionate way is our goal.  Partnering with you,  so that together we can achieve this goal is our strategy.

## 2016-09-19 NOTE — Progress Notes (Signed)
ROOSEVELT EIMERS     MRN: [id number]      DOB: 11/07/1980   HPI Mr. Halfmann is here for follow up of hospitalization from 7/17/to 7/22 for acute on chronic respiratory failure, and he has now transitioned from CPAP to bipap.  I have also made it clear to both himself and his mother that he also has hj heart failure resulting from his severe lung disease and medical compliance and weight loss are vial to improve his health and save his life. He hAS LOST 4 ponds since d/c and reports compliance , states feeling better, and plans to continue this way Wants to go to diabetic ed, still asks we appetite suppressant, advised none available Questions re alcohpol/ mixed drinks and sodas, advised against both  ROS See HPI    PE  BP 110/60 (BP Location: Left Arm, Patient Position: Sitting, Cuff Size: Large)   Pulse 96   Temp (!) 97.1 F (36.2 C) (Other (Comment))   Resp 16   Ht 5\' 5"  (1.651 m)   Wt (!) 356 lb 8 oz (161.7 kg)   SpO2 97%   BMI 59.32 kg/m   Patient alert and oriented and in no cardiopulmonary distress.  HEENT: No facial asymmetry, EOMI,   oropharynx pink and moist.  Neck supple no JVD, no mass.  Chest: Clear to auscultation bilaterally.  CVS: S1, S2 no murmurs, no S3.Regular rate.  ABD: Soft non tender.   Ext: No edema  MS: Adequate ROM spine, shoulders, hips and knees.  Skin: Intact, no ulcerations or rash noted.  Psych: Good eye contact, normal affect. Memory intact not anxious or depressed appearing.  CNS: CN 2-12 intact, power,  normal throughout.no focal deficits noted.   [facility] discharge follow-up Reviewed d/c notes and hospital course with pt and Mother stressing the severity of patient's illness and what NEEDS TO BE DONE  To prevent further  Deterioration. Reports plan to follow through with improved health habits and medical compliance. Labs today ordered  Diabetes mellitus type 2 in obese [facility]) Mr. Burcher is reminded of the  importance of commitment to daily physical activity for 30 minutes or more, as able and the need to limit carbohydrate intake to 30 to 60 grams per meal to help with blood sugar control.   The need to take medication as prescribed, test blood sugar as directed, and to call between visits if there is a concern that blood sugar is uncontrolled is also discussed.   Mr. Mcnay is reminded of the importance of daily foot exam, annual eye examination, and good blood sugar, blood pressure and cholesterol control.  Diabetic Labs Latest Ref Rng & Units 09/10/2016 09/09/2016 09/08/2016 09/07/2016 07/03/2016  HbA1c 4.8 - 5.6 % - - - - -  Chol <200 mg/dL - - - - -  HDL >40 mg/dL - - - - -  Calc LDL <100 mg/dL - - - - -  Triglycerides <150 mg/dL - - - - -  Creatinine 0.61 - 1.24 mg/dL 0.67 0.85 0.69 0.85 0.69   BP/Weight 09/19/2016 09/11/2016 09/10/2016 08/30/2016 08/04/2016 07/13/2016 3/53/6144  Systolic BP 315 400 - - 867 619 509  Diastolic BP 60 94 - - 97 80 91  Wt. (Lbs) 356.5 - 354.5 360 360 361.08 -  BMI 59.32 - 58.99 59.91 59.91 60.09 58.24  Some encounter information is confidential and restricted. Go to Review Flowsheets activity to see all data.   Foot/eye exam completion dates 09/19/2016  Foot Form Completion Done      Updated lab needed and ordered

## 2016-09-19 NOTE — Assessment & Plan Note (Signed)
Reviewed d/c notes and hospital course with pt and Mother stressing the severity of patient's illness and what NEEDS TO BE DONE  To prevent further  Deterioration. Reports plan to follow through with improved health habits and medical compliance. Labs today ordered

## 2016-09-19 NOTE — Telephone Encounter (Signed)
Patient's mom called left a message stating she is returning a call from this morning. Please advise [phone]

## 2016-09-19 NOTE — Assessment & Plan Note (Signed)
Shawn Ramsey is reminded of the importance of commitment to daily physical activity for 30 minutes or more, as able and the need to limit carbohydrate intake to 30 to 60 grams per meal to help with blood sugar control.   The need to take medication as prescribed, test blood sugar as directed, and to call between visits if there is a concern that blood sugar is uncontrolled is also discussed.   Shawn Ramsey is reminded of the importance of daily foot exam, annual eye examination, and good blood sugar, blood pressure and cholesterol control.  Diabetic Labs Latest Ref Rng & Units 09/10/2016 09/09/2016 09/08/2016 09/07/2016 07/03/2016  HbA1c 4.8 - 5.6 % - - - - -  Chol <200 mg/dL - - - - -  HDL >40 mg/dL - - - - -  Calc LDL <100 mg/dL - - - - -  Triglycerides <150 mg/dL - - - - -  Creatinine 0.61 - 1.24 mg/dL 0.67 0.85 0.69 0.85 0.69   BP/Weight 09/19/2016 09/11/2016 09/10/2016 08/30/2016 08/04/2016 07/13/2016 0/63/0160  Systolic BP 109 323 - - 557 322 025  Diastolic BP 60 94 - - 97 80 91  Wt. (Lbs) 356.5 - 354.5 360 360 361.08 -  BMI 59.32 - 58.99 59.91 59.91 60.09 58.24  Some encounter information is confidential and restricted. Go to Review Flowsheets activity to see all data.   Foot/eye exam completion dates 09/19/2016  Foot Form Completion Done      Updated lab needed and ordered

## 2016-09-19 NOTE — Telephone Encounter (Signed)
Called and left message that pt had appt today at 3:40 with dr [name]

## 2016-09-20 ENCOUNTER — Encounter: Payer: Self-pay | Admitting: "Endocrinology

## 2016-09-20 ENCOUNTER — Telehealth: Payer: Self-pay | Admitting: *Deleted

## 2016-09-20 ENCOUNTER — Ambulatory Visit (INDEPENDENT_AMBULATORY_CARE_PROVIDER_SITE_OTHER): Payer: Medicaid Other | Admitting: "Endocrinology

## 2016-09-20 ENCOUNTER — Encounter: Payer: Self-pay | Admitting: Family Medicine

## 2016-09-20 VITALS — BP 143/84 | HR 108 | Ht 65.0 in | Wt 356.0 lb

## 2016-09-20 DIAGNOSIS — E1169 Type 2 diabetes mellitus with other specified complication: Secondary | ICD-10-CM

## 2016-09-20 DIAGNOSIS — E669 Obesity, unspecified: Secondary | ICD-10-CM | POA: Diagnosis not present

## 2016-09-20 DIAGNOSIS — Z6841 Body Mass Index (BMI) 40.0 and over, adult: Secondary | ICD-10-CM | POA: Diagnosis not present

## 2016-09-20 LAB — MICROALBUMIN / CREATININE URINE RATIO
Creatinine, Urine: 168 mg/dL (ref 20–370)
Microalb Creat Ratio: 2 mcg/mg creat (ref <30)
Microalb, Ur: 0.4 mg/dL

## 2016-09-20 LAB — BASIC METABOLIC PANEL WITH GFR
BUN: 13 mg/dL (ref 7–25)
CO2: 29 mmol/L (ref 20–31)
Calcium: 9.2 mg/dL (ref 8.6–10.3)
Chloride: 99 mmol/L (ref 98–110)
Creat: 0.74 mg/dL (ref 0.60–1.35)
GFR, Est African American: >89 mL/min (ref >=60)
GFR, Est Non African American: >89 mL/min (ref >=60)
Glucose, Bld: 86 mg/dL (ref 65–99)
Potassium: 3.9 mmol/L (ref 3.5–5.3)
Sodium: 141 mmol/L (ref 135–146)

## 2016-09-20 LAB — HEMOGLOBIN A1C
Hgb A1c MFr Bld: 6.8 % — ABNORMAL HIGH (ref <5.7)
Mean Plasma Glucose: 148 mg/dL

## 2016-09-20 MED ORDER — BLOOD GLUCOSE MONITOR KIT: PACK | 0 refills | Status: DC

## 2016-09-20 NOTE — Telephone Encounter (Signed)
Patient's mother called left message stating the nutritionist told them that Dr [name] will have to order a meter and strips for the patient.

## 2016-09-20 NOTE — Telephone Encounter (Signed)
Done and faxed and mother aware

## 2016-09-20 NOTE — Telephone Encounter (Signed)
Does he need to test daily?

## 2016-09-20 NOTE — Patient Instructions (Signed)
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
## 2016-09-20 NOTE — Telephone Encounter (Signed)
Once daily, he is on metformin

## 2016-09-28 ENCOUNTER — Ambulatory Visit: Payer: Self-pay | Admitting: Family Medicine

## 2016-10-13 ENCOUNTER — Ambulatory Visit: Payer: Medicaid Other | Admitting: Nutrition

## 2016-10-17 ENCOUNTER — Telehealth: Payer: Self-pay | Admitting: Family Medicine

## 2016-10-17 ENCOUNTER — Encounter: Payer: Self-pay | Admitting: Family Medicine

## 2016-10-17 NOTE — Telephone Encounter (Signed)
Shawn Ramsey, [place] [employer], left message regarding patient. She states that patient is in need of a larger bedroom apartment due to medical conditions and equipment. She states she needs a doctor's letter faxed to [phone]

## 2016-10-17 NOTE — Telephone Encounter (Signed)
Please type a letter to that effect, I will sign, thanks

## 2016-10-19 ENCOUNTER — Ambulatory Visit (INDEPENDENT_AMBULATORY_CARE_PROVIDER_SITE_OTHER): Payer: Medicaid Other | Admitting: Family Medicine

## 2016-10-19 ENCOUNTER — Encounter: Payer: Self-pay | Admitting: Family Medicine

## 2016-10-19 VITALS — BP 140/80 | HR 104 | Resp 16 | Ht 65.0 in | Wt 340.1 lb

## 2016-10-19 DIAGNOSIS — J45991 Cough variant asthma: Secondary | ICD-10-CM | POA: Diagnosis not present

## 2016-10-19 DIAGNOSIS — G4733 Obstructive sleep apnea (adult) (pediatric): Secondary | ICD-10-CM

## 2016-10-19 DIAGNOSIS — E669 Obesity, unspecified: Secondary | ICD-10-CM | POA: Diagnosis not present

## 2016-10-19 DIAGNOSIS — Z23 Encounter for immunization: Secondary | ICD-10-CM

## 2016-10-19 DIAGNOSIS — L2084 Intrinsic (allergic) eczema: Secondary | ICD-10-CM | POA: Diagnosis not present

## 2016-10-19 DIAGNOSIS — E785 Hyperlipidemia, unspecified: Secondary | ICD-10-CM | POA: Diagnosis not present

## 2016-10-19 DIAGNOSIS — I10 Essential (primary) hypertension: Secondary | ICD-10-CM | POA: Diagnosis not present

## 2016-10-19 DIAGNOSIS — Z6841 Body Mass Index (BMI) 40.0 and over, adult: Secondary | ICD-10-CM

## 2016-10-19 DIAGNOSIS — E1169 Type 2 diabetes mellitus with other specified complication: Secondary | ICD-10-CM

## 2016-10-19 NOTE — Progress Notes (Signed)
htn

## 2016-10-19 NOTE — Patient Instructions (Addendum)
F/U early December.  CONGRATS on 16 pound weight loss in last 2 months  Use Bipapp while sleeping  STAY on this WINNING program, no turning back  Flu vaccine today  Fasting lipid, cmp and EGFr and hBA1C 1 week before next visit   You are referred for diabetic eye exam   Use hydrocortisone cream on skin

## 2016-10-19 NOTE — Assessment & Plan Note (Signed)
Controlled, no change in medication Shawn Ramsey is reminded of the importance of commitment to daily physical activity for 30 minutes or more, as able and the need to limit carbohydrate intake to 30 to 60 grams per meal to help with blood sugar control.   The need to take medication as prescribed, test blood sugar as directed, and to call between visits if there is a concern that blood sugar is uncontrolled is also discussed.   Shawn Ramsey is reminded of the importance of daily foot exam, annual eye examination, and good blood sugar, blood pressure and cholesterol control.  Diabetic Labs Latest Ref Rng & Units 09/19/2016 09/10/2016 09/09/2016 09/08/2016 09/07/2016  HbA1c <5.7 % 6.8(H) - - - -  Microalbumin Not estab mg/dL 0.4 - - - -  Micro/Creat Ratio <30 mcg/mg creat 2 - - - -  Chol <200 mg/dL - - - - -  HDL >40 mg/dL - - - - -  Calc LDL <100 mg/dL - - - - -  Triglycerides <150 mg/dL - - - - -  Creatinine 0.60 - 1.35 mg/dL 0.74 0.67 0.85 0.69 0.85   BP/Weight 10/19/2016 09/20/2016 09/19/2016 09/11/2016 09/10/2016 08/30/2016 8/92/1194  Systolic BP 174 081 448 185 - - 631  Diastolic BP 80 84 60 94 - - 97  Wt. (Lbs) 340.12 356 356.5 - 354.5 360 360  BMI 56.6 59.24 59.32 - 58.99 59.91 59.91  Some encounter information is confidential and restricted. Go to Review Flowsheets activity to see all data.   Foot/eye exam completion dates 09/19/2016  Foot Form Completion Done

## 2016-10-24 NOTE — Assessment & Plan Note (Signed)
Controlled, no change in medication  

## 2016-10-24 NOTE — Assessment & Plan Note (Signed)
Controlled, no change in medication DASH diet and commitment to daily physical activity for a minimum of 30 minutes discussed and encouraged, as a part of hypertension management. The importance of attaining a healthy weight is also discussed.  BP/Weight 10/19/2016 09/20/2016 09/19/2016 09/11/2016 09/10/2016 08/30/2016 11/10/1658  Systolic BP 600 459 977 414 - - 239  Diastolic BP 80 84 60 94 - - 97  Wt. (Lbs) 340.12 356 356.5 - 354.5 360 360  BMI 56.6 59.24 59.32 - 58.99 59.91 59.91  Some encounter information is confidential and restricted. Go to Review Flowsheets activity to see all data.

## 2016-10-24 NOTE — Assessment & Plan Note (Signed)
Compliance stressed and pt reports improvement oin wellbeing with compliance

## 2016-10-24 NOTE — Progress Notes (Signed)
Shawn Ramsey     MRN: [id number]      DOB: 07-23-1980   HPI Shawn Ramsey is here for follow up and re-evaluation of chronic medical conditions, medication management and review of any available recent lab and radiology data.  Preventive health is updated, specifically  Cancer screening and Immunization.   Questions or concerns regarding consultations or procedures which the PT has had in the interim are  addressed. The PT denies any adverse reactions to current medications since the last visit.  C/o itchy rash on back of hand  ROS Denies recent fever or chills. Denies sinus pressure, nasal congestion, ear pain or sore throat. Denies chest congestion, productive cough or wheezing. Denies chest pains, palpitations and leg swelling Denies abdominal pain, nausea, vomiting,diarrhea or constipation.   Denies dysuria, frequency, hesitancy or incontinence. Denies joint pain, swelling and limitation in mobility. Denies headaches, seizures, numbness, or tingling. Denies depression, anxiety or insomnia.    PE  BP 140/80   Pulse (!) 104   Resp 16   Ht 5\' 5"  (1.651 m)   Wt (!) 340 lb 1.9 oz (154.3 kg)   SpO2 97%   BMI 56.60 kg/m   Patient alert and oriented and in no cardiopulmonary distress.  HEENT: No facial asymmetry, EOMI,   oropharynx pink and moist.  Neck supple no JVD, no mass.  Chest: Clear to auscultation bilaterally.  CVS: S1, S2 no murmurs, no S3.Regular rate.  ABD: Soft non tender.   Ext: No edema  MS: Adequate ROM spine, shoulders, hips and knees.  Skin: Intact, no ulcerations noted.Hyperpigmented macular rash on dorsum of hands  Psych: Good eye contact, normal affect. Memory intact not anxious or depressed appearing.  CNS: CN 2-12 intact, power,  normal throughout.no focal deficits noted.   Assessment & Plan  Diabetes mellitus type 2 in obese (HCC) Controlled, no change in medication Shawn Ramsey is reminded of the importance of commitment to daily  physical activity for 30 minutes or more, as able and the need to limit carbohydrate intake to 30 to 60 grams per meal to help with blood sugar control.   The need to take medication as prescribed, test blood sugar as directed, and to call between visits if there is a concern that blood sugar is uncontrolled is also discussed.   Shawn Ramsey is reminded of the importance of daily foot exam, annual eye examination, and good blood sugar, blood pressure and cholesterol control.  Diabetic Labs Latest Ref Rng & Units 09/19/2016 09/10/2016 09/09/2016 09/08/2016 09/07/2016  HbA1c <5.7 % 6.8(H) - - - -  Microalbumin Not estab mg/dL 0.4 - - - -  Micro/Creat Ratio <30 mcg/mg creat 2 - - - -  Chol <200 mg/dL - - - - -  HDL >40 mg/dL - - - - -  Calc LDL <100 mg/dL - - - - -  Triglycerides <150 mg/dL - - - - -  Creatinine 0.60 - 1.35 mg/dL 0.74 0.67 0.85 0.69 0.85   BP/Weight 10/19/2016 09/20/2016 09/19/2016 09/11/2016 09/10/2016 08/30/2016 08/20/5282  Systolic BP 132 440 102 725 - - 366  Diastolic BP 80 84 60 94 - - 97  Wt. (Lbs) 340.12 356 356.5 - 354.5 360 360  BMI 56.6 59.24 59.32 - 58.99 59.91 59.91  Some encounter information is confidential and restricted. Go to Review Flowsheets activity to see all data.   Foot/eye exam completion dates 09/19/2016  Foot Form Completion Done        Essential hypertension  Controlled, no change in medication DASH diet and commitment to daily physical activity for a minimum of 30 minutes discussed and encouraged, as a part of hypertension management. The importance of attaining a healthy weight is also discussed.  BP/Weight 10/19/2016 09/20/2016 09/19/2016 09/11/2016 09/10/2016 08/30/2016 2/42/3536  Systolic BP 144 315 400 867 - - 619  Diastolic BP 80 84 60 94 - - 97  Wt. (Lbs) 340.12 356 356.5 - 354.5 360 360  BMI 56.6 59.24 59.32 - 58.99 59.91 59.91  Some encounter information is confidential and restricted. Go to Review Flowsheets activity to see all data.        OSA (obstructive sleep apnea) Compliance stressed and pt reports improvement oin wellbeing with compliance  Morbid obesity with BMI of 60.0-69.9, adult ([facility]CC) Improved. Patient re-educated about  the importance of commitment to a  minimum of 150 minutes of exercise per week.  The importance of healthy food choices with portion control discussed. Encouraged to start a food diary, count calories and to consider  joining a support group. Sample diet sheets offered. Goals set by the patient for the next several months.   Weight /BMI 10/19/2016 09/20/2016 09/19/2016  WEIGHT 340 lb 1.9 oz 356 lb 356 lb 8 oz  HEIGHT 5\' 5"  5\' 5"  5\' 5"   BMI 56.6 kg/m2 59.24 kg/m2 59.32 kg/m2  Some encounter information is confidential and restricted. Go to Review Flowsheets activity to see all data.      Eczema encouraged use of topical steroid  COUGH VARIANT ASTHMA Controlled, no change in medication

## 2016-10-24 NOTE — Assessment & Plan Note (Signed)
encouraged use of topical steroid

## 2016-10-24 NOTE — Assessment & Plan Note (Signed)
Improved. Patient re-educated about  the importance of commitment to a  minimum of 150 minutes of exercise per week.  The importance of healthy food choices with portion control discussed. Encouraged to start a food diary, count calories and to consider  joining a support group. Sample diet sheets offered. Goals set by the patient for the next several months.   Weight /BMI 10/19/2016 09/20/2016 09/19/2016  WEIGHT 340 lb 1.9 oz 356 lb 356 lb 8 oz  HEIGHT 5\' 5"  5\' 5"  5\' 5"   BMI 56.6 kg/m2 59.24 kg/m2 59.32 kg/m2  Some encounter information is confidential and restricted. Go to Review Flowsheets activity to see all data.

## 2016-11-18 ENCOUNTER — Encounter (HOSPITAL_COMMUNITY): Payer: Self-pay | Admitting: *Deleted

## 2016-11-18 ENCOUNTER — Emergency Department (HOSPITAL_COMMUNITY)
Admission: EM | Admit: 2016-11-18 | Discharge: 2016-11-18 | Disposition: A | Discharge status: Home / Self Care | Payer: Medicaid Other | Attending: Emergency Medicine | Admitting: Emergency Medicine

## 2016-11-18 DIAGNOSIS — I10 Essential (primary) hypertension: Secondary | ICD-10-CM | POA: Diagnosis not present

## 2016-11-18 DIAGNOSIS — Z79899 Other long term (current) drug therapy: Secondary | ICD-10-CM | POA: Insufficient documentation

## 2016-11-18 DIAGNOSIS — Z7984 Long term (current) use of oral hypoglycemic drugs: Secondary | ICD-10-CM | POA: Insufficient documentation

## 2016-11-18 DIAGNOSIS — N342 Other urethritis: Secondary | ICD-10-CM | POA: Insufficient documentation

## 2016-11-18 DIAGNOSIS — J449 Chronic obstructive pulmonary disease, unspecified: Secondary | ICD-10-CM | POA: Diagnosis not present

## 2016-11-18 DIAGNOSIS — E119 Type 2 diabetes mellitus without complications: Secondary | ICD-10-CM | POA: Diagnosis not present

## 2016-11-18 DIAGNOSIS — F17210 Nicotine dependence, cigarettes, uncomplicated: Secondary | ICD-10-CM | POA: Insufficient documentation

## 2016-11-18 DIAGNOSIS — R300 Dysuria: Secondary | ICD-10-CM | POA: Diagnosis present

## 2016-11-18 DIAGNOSIS — Z202 Contact with and (suspected) exposure to infections with a predominantly sexual mode of transmission: Secondary | ICD-10-CM | POA: Insufficient documentation

## 2016-11-18 DIAGNOSIS — J45909 Unspecified asthma, uncomplicated: Secondary | ICD-10-CM | POA: Insufficient documentation

## 2016-11-18 MED ORDER — CEFTRIAXONE SODIUM 250 MG IJ SOLR
250.0000 mg | Freq: Once | INTRAMUSCULAR | Status: AC
Start: 2016-11-18 — End: 2016-11-18
  Administered 2016-11-18: 250 mg via INTRAMUSCULAR
  Filled 2016-11-18: qty 250

## 2016-11-18 MED ORDER — AZITHROMYCIN 250 MG PO TABS
1000.0000 mg | ORAL_TABLET | Freq: Once | ORAL | Status: AC
  Administered 2016-11-18: 1000 mg via ORAL
  Filled 2016-11-18: qty 4

## 2016-11-18 MED ORDER — LIDOCAINE HCL (PF) 1 % IJ SOLN
INTRAMUSCULAR | Status: AC
  Filled 2016-11-18: qty 2

## 2016-11-18 NOTE — ED Notes (Signed)
Pt alert & oriented x4, stable gait. Patient given discharge instructions, paperwork & prescription(s). Patient  instructed to stop at the registration desk to finish any additional paperwork. Patient verbalized understanding. Pt left department w/ no further questions. 

## 2016-11-18 NOTE — ED Triage Notes (Signed)
Pt states unprotected sex last week, states last night noted clear discharge from penis.  Some burning with urination.

## 2016-11-18 NOTE — Discharge Instructions (Signed)
Use a condom each time that you have sex. Tell all of your sexual partners about today's visit so that they can be checked or sexually transmitted diseases by their primary care physicians or the health Department
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
## 2016-11-20 LAB — RPR: RPR Ser Ql: NONREACTIVE

## 2016-11-20 LAB — HIV ANTIBODY (ROUTINE TESTING W REFLEX): HIV Screen 4th Generation wRfx: NONREACTIVE

## 2016-11-21 LAB — GC/CHLAMYDIA PROBE AMP (~~LOC~~) NOT AT ARMC
Chlamydia: NEGATIVE
Neisseria Gonorrhea: NEGATIVE

## 2016-12-09 ENCOUNTER — Other Ambulatory Visit: Payer: Self-pay | Admitting: Family Medicine

## 2016-12-13 ENCOUNTER — Emergency Department (HOSPITAL_COMMUNITY)
Admission: EM | Admit: 2016-12-13 | Discharge: 2016-12-13 | Disposition: A | Discharge status: Home / Self Care | Payer: Medicaid Other | Attending: Emergency Medicine | Admitting: Emergency Medicine

## 2016-12-13 ENCOUNTER — Encounter (HOSPITAL_COMMUNITY): Payer: Self-pay | Admitting: Emergency Medicine

## 2016-12-13 ENCOUNTER — Emergency Department (HOSPITAL_COMMUNITY): Payer: Medicaid Other

## 2016-12-13 DIAGNOSIS — J40 Bronchitis, not specified as acute or chronic: Secondary | ICD-10-CM | POA: Diagnosis not present

## 2016-12-13 DIAGNOSIS — F17210 Nicotine dependence, cigarettes, uncomplicated: Secondary | ICD-10-CM | POA: Insufficient documentation

## 2016-12-13 DIAGNOSIS — R0602 Shortness of breath: Secondary | ICD-10-CM

## 2016-12-13 DIAGNOSIS — R609 Edema, unspecified: Secondary | ICD-10-CM | POA: Diagnosis not present

## 2016-12-13 DIAGNOSIS — I10 Essential (primary) hypertension: Secondary | ICD-10-CM | POA: Insufficient documentation

## 2016-12-13 DIAGNOSIS — J449 Chronic obstructive pulmonary disease, unspecified: Secondary | ICD-10-CM | POA: Insufficient documentation

## 2016-12-13 DIAGNOSIS — R059 Cough, unspecified: Secondary | ICD-10-CM

## 2016-12-13 DIAGNOSIS — Z79899 Other long term (current) drug therapy: Secondary | ICD-10-CM | POA: Diagnosis not present

## 2016-12-13 DIAGNOSIS — E119 Type 2 diabetes mellitus without complications: Secondary | ICD-10-CM | POA: Diagnosis not present

## 2016-12-13 DIAGNOSIS — R05 Cough: Secondary | ICD-10-CM

## 2016-12-13 DIAGNOSIS — Z7984 Long term (current) use of oral hypoglycemic drugs: Secondary | ICD-10-CM | POA: Diagnosis not present

## 2016-12-13 MED ORDER — PREDNISONE 10 MG PO TABS: 10.0000 mg | ORAL_TABLET | Freq: Every day | ORAL | 0 refills | Status: DC

## 2016-12-13 MED ORDER — AZITHROMYCIN 250 MG PO TABS: ORAL_TABLET | ORAL | 0 refills | Status: DC

## 2016-12-13 MED ORDER — FUROSEMIDE 10 MG/ML IJ SOLN
40.0000 mg | Freq: Once | INTRAMUSCULAR | Status: AC
  Administered 2016-12-13: 40 mg via INTRAMUSCULAR
  Filled 2016-12-13: qty 4

## 2016-12-13 MED ORDER — OXYCODONE-ACETAMINOPHEN 5-325 MG PO TABS: 1.0000 | ORAL_TABLET | Freq: Once | ORAL | Status: DC

## 2016-12-13 NOTE — Discharge Instructions (Signed)
Prescription for antibiotic and prednisone.  Can also increase your torsemide by 1 tablet daily either in the morning or evening

## 2016-12-13 NOTE — ED Triage Notes (Addendum)
Patient c/o productive cough with thick clear sputum. Denies any fevers. Patient states shortness of breath and mid-sternal chest pain with cough. Patient also reports swelling in legs bilateral. Patient takes torsemide for edema and states he has not taken medication in past 2 days. Per patient also has been around gas fumes and cigarette smoke recently, hx of asthma/COPD. Patient uses c-pap and nebulizer machine at home. Patient has not used neb treatment.
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
## 2016-12-20 ENCOUNTER — Ambulatory Visit: Payer: Medicaid Other | Admitting: "Endocrinology

## 2016-12-20 ENCOUNTER — Encounter: Payer: Self-pay | Admitting: "Endocrinology

## 2016-12-21 ENCOUNTER — Ambulatory Visit: Payer: Medicaid Other | Admitting: Family Medicine

## 2017-01-26 ENCOUNTER — Ambulatory Visit: Payer: Medicaid Other | Admitting: Family Medicine

## 2017-01-26 ENCOUNTER — Encounter: Payer: Self-pay | Admitting: Family Medicine

## 2017-01-26 VITALS — BP 118/70 | HR 111 | Resp 16 | Ht 65.0 in | Wt 325.0 lb

## 2017-01-26 DIAGNOSIS — I10 Essential (primary) hypertension: Secondary | ICD-10-CM

## 2017-01-26 DIAGNOSIS — F17218 Nicotine dependence, cigarettes, with other nicotine-induced disorders: Secondary | ICD-10-CM

## 2017-01-26 DIAGNOSIS — E1169 Type 2 diabetes mellitus with other specified complication: Secondary | ICD-10-CM | POA: Diagnosis not present

## 2017-01-26 DIAGNOSIS — E669 Obesity, unspecified: Secondary | ICD-10-CM

## 2017-01-26 DIAGNOSIS — Z6841 Body Mass Index (BMI) 40.0 and over, adult: Secondary | ICD-10-CM

## 2017-01-26 DIAGNOSIS — G4733 Obstructive sleep apnea (adult) (pediatric): Secondary | ICD-10-CM | POA: Diagnosis not present

## 2017-01-26 DIAGNOSIS — J45991 Cough variant asthma: Secondary | ICD-10-CM

## 2017-01-26 LAB — GLUCOSE, POCT (MANUAL RESULT ENTRY): POC Glucose: 89 mg/dl (ref 70–99)

## 2017-01-26 NOTE — Progress Notes (Signed)
Shawn Ramsey     MRN: [id number]      DOB: 1980-07-14   HPI Shawn Ramsey is here for follow up and re-evaluation of chronic medical conditions, medication management and review of any available recent lab and radiology data.  Preventive health is updated, specifically  Cancer screening and Immunization.   Questions or concerns regarding consultations or procedures which the PT has had in the interim are  addressed. The PT denies any adverse reactions to current medications since the last visit.  Drank spoiled orange juice  1 week ago and has been around a lot cigarette smoke , feels weak Denies polyuria, polydipsia, blurred vision , or hypoglycemic episodes.  ROS Denies recent fever or chills. Denies sinus pressure, nasal congestion, ear pain or sore throat. Denies chest congestion, productive cough or wheezing. Denies chest pains, palpitations and leg swelling Denies abdominal pain, nausea, vomiting,diarrhea or constipation.   Denies dysuria, frequency, hesitancy or incontinence. Denies joint pain, swelling and limitation in mobility. Denies headaches, seizures, numbness, or tingling. Denies depression, anxiety or insomnia. Denies skin break down or rash.   PE  BP 118/70   Pulse (!) 111   Resp 16   Ht 5\' 5"  (1.651 m)   Wt (!) 325 lb (147.4 kg)   SpO2 92%   BMI 54.08 kg/m   Patient alert and oriented and in no cardiopulmonary distress.  HEENT: No facial asymmetry, EOMI,   oropharynx pink and moist.  Neck supple no JVD, no mass.  Chest: Clear to auscultation bilaterally.  CVS: S1, S2 no murmurs, no S3.Regular rate.  ABD: Soft non tender.   Ext: No edema  MS: Adequate ROM spine, shoulders, hips and knees.  Skin: Intact, no ulcerations or rash noted.  Psych: Good eye contact, normal affect. Memory intact not anxious or depressed appearing.  CNS: CN 2-12 intact, power,  normal throughout.no focal deficits noted.   Assessment & Plan  Essential  hypertension Controlled, no change in medication DASH diet and commitment to daily physical activity for a minimum of 30 minutes discussed and encouraged, as a part of hypertension management. The importance of attaining a healthy weight is also discussed.  BP/Weight 01/26/2017 12/13/2016 11/18/2016 10/19/2016 09/20/2016 09/19/2016 2/42/3536  Systolic BP 144 315 400 867 619 509 326  Diastolic BP 70 53 58 80 84 60 94  Wt. (Lbs) 325 - 324 340.12 356 356.5 -  BMI 54.08 - 53.92 56.6 59.24 59.32 -  Some encounter information is confidential and restricted. Go to Review Flowsheets activity to see all data.       Morbid obesity with BMI of 60.0-69.9, adult ([place]) Improving. Patient re-educated about  the importance of commitment to a  minimum of 150 minutes of exercise per week.  The importance of healthy food choices with portion control discussed. Encouraged to start a food diary, count calories and to consider  joining a support group. Sample diet sheets offered. Goals set by the patient for the next several months.   Weight /BMI 01/26/2017 11/18/2016 10/19/2016  WEIGHT 325 lb 324 lb 340 lb 1.9 oz  HEIGHT 5\' 5"  5\' 5"  5\' 5"   BMI 54.08 kg/m2 53.92 kg/m2 56.6 kg/m2  Some encounter information is confidential and restricted. Go to Review Flowsheets activity to see all data.      Diabetes mellitus type 2 in obese (HCC) Controlled, no change in medication Shawn Ramsey is reminded of the importance of commitment to daily physical activity for 30 minutes or more, as able  and the need to limit carbohydrate intake to 30 to 60 grams per meal to help with blood sugar control.   The need to take medication as prescribed, test blood sugar as directed, and to call between visits if there is a concern that blood sugar is uncontrolled is also discussed.   Shawn Ramsey is reminded of the importance of daily foot exam, annual eye examination, and good blood sugar, blood pressure and cholesterol control. Markedly  improved, coud be off medication , but this low dose metformin is like helping with weight management so will continue this  Diabetic Labs Latest Ref Rng & Units 01/26/2017 09/19/2016 09/10/2016 09/09/2016 09/08/2016  HbA1c <5.7 % of total Hgb 5.8(H) 6.8(H) - - -  Microalbumin Not estab mg/dL - 0.4 - - -  Micro/Creat Ratio <30 mcg/mg creat - 2 - - -  Chol <200 mg/dL 161 - - - -  HDL >40 mg/dL 35(L) - - - -  Calc LDL <100 mg/dL - - - - -  Triglycerides <150 mg/dL 184(H) - - - -  Creatinine 0.60 - 1.35 mg/dL 0.80 0.74 0.67 0.85 0.69   BP/Weight 01/26/2017 12/13/2016 11/18/2016 10/19/2016 09/20/2016 09/19/2016 0/93/8182  Systolic BP 993 716 967 893 810 175 102  Diastolic BP 70 53 58 80 84 60 94  Wt. (Lbs) 325 - 324 340.12 356 356.5 -  BMI 54.08 - 53.92 56.6 59.24 59.32 -  Some encounter information is confidential and restricted. Go to Review Flowsheets activity to see all data.   Foot/eye exam completion dates 09/19/2016  Foot Form Completion Done        Cigarette nicotine dependence Patient is asked and  confirms current  Nicotine use.  Five to seven minutes of time is spent in counseling the patient of the need to quit smoking  Advice to quit is delivered clearly specifically in reducing the risk of developing heart disease, having a stroke, or of developing all types of cancer, especially lung and oral cancer. Improvement in breathing and exercise tolerance and quality of life is also discussed, as is the economic benefit.  Assessment of willingness to quit or to make an attempt to quit is made and documented  Assistance in quit attempt is made with several and varied options presented, based on patient's desire and need. These include  literature, local classes available, 1800 QUIT NOW number, OTC and prescription medication.  The GOAL to be NICOTINE FREE is re emphasized.  The patient has set a personal goal of either reduction or discontinuation and follow up is arranged between 6  an 16 weeks.    COUGH VARIANT ASTHMA Controlled, no change in medication   OSA (obstructive sleep apnea) Now compliant with treatment and health is improving, applauded on this and encouraged to continue same

## 2017-01-26 NOTE — Patient Instructions (Signed)
F/u in 4.5 month, call if you need me sooner  Please bring all medication to next appointment  Please check re eye exam, need to see Doc who takes medicaid  Labs today, CBC, lipid, cmp and eGFR and HBA1C  Congrats on weight loss , keep it up  Thank you  for choosing [facility]. We consider it a privelige to serve you.  Delivering excellent health care in a caring and  compassionate way is our goal.  Partnering with you,  so that together we can achieve this goal is our strategy.

## 2017-01-27 ENCOUNTER — Encounter: Payer: Self-pay | Admitting: Family Medicine

## 2017-01-27 LAB — LIPID PANEL
Cholesterol: 161 mg/dL (ref <200)
HDL: 35 mg/dL — ABNORMAL LOW (ref >40)
LDL Cholesterol (Calc): 98 mg/dL (calc)
Non-HDL Cholesterol (Calc): 126 mg/dL (calc) (ref <130)
Total CHOL/HDL Ratio: 4.6 (calc) (ref <5.0)
Triglycerides: 184 mg/dL — ABNORMAL HIGH (ref <150)

## 2017-01-27 LAB — COMPLETE METABOLIC PANEL WITH GFR
AG Ratio: 1.4 (calc) (ref 1.0–2.5)
ALT: 14 U/L (ref 9–46)
AST: 10 U/L (ref 10–40)
Albumin: 4.6 g/dL (ref 3.6–5.1)
Alkaline phosphatase (APISO): 98 U/L (ref 40–115)
BUN: 12 mg/dL (ref 7–25)
CO2: 30 mmol/L (ref 20–32)
Calcium: 9.6 mg/dL (ref 8.6–10.3)
Chloride: 99 mmol/L (ref 98–110)
Creat: 0.8 mg/dL (ref 0.60–1.35)
GFR, Est African American: 133 mL/min/{1.73_m2} (ref >=60)
GFR, Est Non African American: 115 mL/min/{1.73_m2} (ref >=60)
Globulin: 3.4 g/dL (calc) (ref 1.9–3.7)
Glucose, Bld: 88 mg/dL (ref 65–139)
Potassium: 3.8 mmol/L (ref 3.5–5.3)
Sodium: 142 mmol/L (ref 135–146)
Total Bilirubin: 0.3 mg/dL (ref 0.2–1.2)
Total Protein: 8 g/dL (ref 6.1–8.1)

## 2017-01-27 LAB — CBC
HCT: 39.6 % (ref 38.5–50.0)
Hemoglobin: 13.3 g/dL (ref 13.2–17.1)
MCH: 28 pg (ref 27.0–33.0)
MCHC: 33.6 g/dL (ref 32.0–36.0)
MCV: 83.4 fL (ref 80.0–100.0)
MPV: 9.4 fL (ref 7.5–12.5)
Platelets: 400 10*3/uL (ref 140–400)
RBC: 4.75 10*6/uL (ref 4.20–5.80)
RDW: 15.7 % — ABNORMAL HIGH (ref 11.0–15.0)
WBC: 9.3 10*3/uL (ref 3.8–10.8)

## 2017-01-27 LAB — HEMOGLOBIN A1C
Hgb A1c MFr Bld: 5.8 % of total Hgb — ABNORMAL HIGH (ref <5.7)
Mean Plasma Glucose: 120 (calc)
eAG (mmol/L): 6.6 (calc)

## 2017-01-29 ENCOUNTER — Encounter: Payer: Self-pay | Admitting: Family Medicine

## 2017-01-29 NOTE — Assessment & Plan Note (Signed)
Controlled, no change in medication  

## 2017-01-29 NOTE — Assessment & Plan Note (Signed)
Improving. Patient re-educated about  the importance of commitment to a  minimum of 150 minutes of exercise per week.  The importance of healthy food choices with portion control discussed. Encouraged to start a food diary, count calories and to consider  joining a support group. Sample diet sheets offered. Goals set by the patient for the next several months.   Weight /BMI 01/26/2017 11/18/2016 10/19/2016  WEIGHT 325 lb 324 lb 340 lb 1.9 oz  HEIGHT 5\' 5"  5\' 5"  5\' 5"   BMI 54.08 kg/m2 53.92 kg/m2 56.6 kg/m2  Some encounter information is confidential and restricted. Go to Review Flowsheets activity to see all data.

## 2017-01-29 NOTE — Assessment & Plan Note (Signed)

## 2017-01-29 NOTE — Assessment & Plan Note (Signed)
Controlled, no change in medication DASH diet and commitment to daily physical activity for a minimum of 30 minutes discussed and encouraged, as a part of hypertension management. The importance of attaining a healthy weight is also discussed.  BP/Weight 01/26/2017 12/13/2016 11/18/2016 10/19/2016 09/20/2016 09/19/2016 2/84/1324  Systolic BP 401 027 253 664 403 474 259  Diastolic BP 70 53 58 80 84 60 94  Wt. (Lbs) 325 - 324 340.12 356 356.5 -  BMI 54.08 - 53.92 56.6 59.24 59.32 -  Some encounter information is confidential and restricted. Go to Review Flowsheets activity to see all data.

## 2017-01-29 NOTE — Assessment & Plan Note (Signed)
Now compliant with treatment and health is improving, applauded on this and encouraged to continue same

## 2017-01-29 NOTE — Assessment & Plan Note (Signed)
Controlled, no change in medication Shawn Ramsey is reminded of the importance of commitment to daily physical activity for 30 minutes or more, as able and the need to limit carbohydrate intake to 30 to 60 grams per meal to help with blood sugar control.   The need to take medication as prescribed, test blood sugar as directed, and to call between visits if there is a concern that blood sugar is uncontrolled is also discussed.   Shawn Ramsey is reminded of the importance of daily foot exam, annual eye examination, and good blood sugar, blood pressure and cholesterol control. Markedly improved, coud be off medication , but this low dose metformin is like helping with weight management so will continue this  Diabetic Labs Latest Ref Rng & Units 01/26/2017 09/19/2016 09/10/2016 09/09/2016 09/08/2016  HbA1c <5.7 % of total Hgb 5.8(H) 6.8(H) - - -  Microalbumin Not estab mg/dL - 0.4 - - -  Micro/Creat Ratio <30 mcg/mg creat - 2 - - -  Chol <200 mg/dL 161 - - - -  HDL >40 mg/dL 35(L) - - - -  Calc LDL <100 mg/dL - - - - -  Triglycerides <150 mg/dL 184(H) - - - -  Creatinine 0.60 - 1.35 mg/dL 0.80 0.74 0.67 0.85 0.69   BP/Weight 01/26/2017 12/13/2016 11/18/2016 10/19/2016 09/20/2016 09/19/2016 2/44/6286  Systolic BP 381 771 165 790 383 338 329  Diastolic BP 70 53 58 80 84 60 94  Wt. (Lbs) 325 - 324 340.12 356 356.5 -  BMI 54.08 - 53.92 56.6 59.24 59.32 -  Some encounter information is confidential and restricted. Go to Review Flowsheets activity to see all data.   Foot/eye exam completion dates 09/19/2016  Foot Form Completion Done
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
## 2017-02-02 ENCOUNTER — Ambulatory Visit: Payer: Medicaid Other | Admitting: Cardiology

## 2017-02-08 ENCOUNTER — Telehealth: Payer: Self-pay | Admitting: Family Medicine

## 2017-02-08 NOTE — Telephone Encounter (Signed)
Patient's mother is requesting a call from [place]. She states that patient went to see [facility] today during session Dr.Laye asked patient if he works, patient stated he couldn't remember the last time he had work. Dr.Laye then told him that he didn't need to come see him that he needed to lose weight and see his pcp. [phone]  She then was receiving a call from [facility] and had to hang up.

## 2017-02-09 ENCOUNTER — Telehealth: Payer: Self-pay | Admitting: Family Medicine

## 2017-02-09 NOTE — Telephone Encounter (Signed)
[name] is calling in regarding Shawn Ramsey and wants Dr [name] to call her  [phone]

## 2017-02-10 NOTE — Telephone Encounter (Signed)
Mother called in distressed stating she is upset , says son has mental problem and needs to see psychiatry, she requested that I refer him to another Provider, I explained that for medicaid, I typically just provide the facitlity names and the patients set up their own appts, no referrals are made, I advised her to call back if needed I gave her the names of [facility], and the other facility in [place] [facility]

## 2017-02-24 ENCOUNTER — Encounter (HOSPITAL_COMMUNITY): Payer: Self-pay | Admitting: Emergency Medicine

## 2017-02-24 ENCOUNTER — Emergency Department (HOSPITAL_COMMUNITY): Payer: Medicaid Other

## 2017-02-24 ENCOUNTER — Other Ambulatory Visit: Payer: Self-pay

## 2017-02-24 ENCOUNTER — Emergency Department (HOSPITAL_COMMUNITY)
Admission: EM | Admit: 2017-02-24 | Discharge: 2017-02-24 | Disposition: A | Discharge status: Home / Self Care | Payer: Medicaid Other | Attending: Emergency Medicine | Admitting: Emergency Medicine

## 2017-02-24 DIAGNOSIS — F17210 Nicotine dependence, cigarettes, uncomplicated: Secondary | ICD-10-CM | POA: Insufficient documentation

## 2017-02-24 DIAGNOSIS — J45909 Unspecified asthma, uncomplicated: Secondary | ICD-10-CM | POA: Diagnosis not present

## 2017-02-24 DIAGNOSIS — R05 Cough: Secondary | ICD-10-CM | POA: Diagnosis present

## 2017-02-24 DIAGNOSIS — I10 Essential (primary) hypertension: Secondary | ICD-10-CM | POA: Diagnosis not present

## 2017-02-24 DIAGNOSIS — Z79899 Other long term (current) drug therapy: Secondary | ICD-10-CM | POA: Insufficient documentation

## 2017-02-24 DIAGNOSIS — J449 Chronic obstructive pulmonary disease, unspecified: Secondary | ICD-10-CM | POA: Insufficient documentation

## 2017-02-24 DIAGNOSIS — Z7984 Long term (current) use of oral hypoglycemic drugs: Secondary | ICD-10-CM | POA: Diagnosis not present

## 2017-02-24 DIAGNOSIS — J069 Acute upper respiratory infection, unspecified: Secondary | ICD-10-CM | POA: Insufficient documentation

## 2017-02-24 DIAGNOSIS — E119 Type 2 diabetes mellitus without complications: Secondary | ICD-10-CM | POA: Diagnosis not present

## 2017-02-24 LAB — INFLUENZA PANEL BY PCR (TYPE A & B)
Influenza A By PCR: NEGATIVE
Influenza B By PCR: NEGATIVE

## 2017-02-24 LAB — TROPONIN I: Troponin I: <0.03 ng/mL (ref <0.03)

## 2017-02-24 MED ORDER — ALBUTEROL SULFATE (2.5 MG/3ML) 0.083% IN NEBU
2.5000 mg | INHALATION_SOLUTION | Freq: Once | RESPIRATORY_TRACT | Status: AC
  Administered 2017-02-24: 2.5 mg via RESPIRATORY_TRACT
  Filled 2017-02-24: qty 3

## 2017-02-24 MED ORDER — ALBUTEROL SULFATE HFA 108 (90 BASE) MCG/ACT IN AERS: 1.0000 | INHALATION_SPRAY | Freq: Four times a day (QID) | RESPIRATORY_TRACT | 0 refills | Status: DC | PRN

## 2017-02-24 MED ORDER — PREDNISONE 20 MG PO TABS
40.0000 mg | ORAL_TABLET | Freq: Once | ORAL | Status: AC
  Administered 2017-02-24: 40 mg via ORAL
  Filled 2017-02-24: qty 2

## 2017-02-24 NOTE — ED Triage Notes (Signed)
Pt reports nasal congestion, sore throat, cough, chills. Pt reports dyspnea with exertion. Pt denies chest pain but reports generalized body aches.

## 2017-02-24 NOTE — Discharge Instructions (Signed)
Your vital signs have been reviewed.  Your oxygen level is 100% on room air.  Your influenza test is negative.  Your chest x-ray is negative for pneumonia or congestive failure or other acute problems.  Your electrocardiogram is negative for acute event.  Please increase your fluids.  Usual mask until symptoms have resolved.  Wash hands frequently.  Use Claritin-D, or the decongestant medicine of your choice.  Use albuterol 2 puffs every 4 hours for difficulty with breathing.  Please see Dr. [name], or return to the emergency department if any changes, problems, or concerns.
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
## 2017-03-01 ENCOUNTER — Telehealth: Payer: Self-pay | Admitting: Family Medicine

## 2017-03-01 NOTE — Telephone Encounter (Signed)
Pt's Mother calling in and states that Corneys Urine is Very Yellow, not sure if it smells. And wants to know if you would call in a Antiboitic.

## 2017-03-03 ENCOUNTER — Telehealth: Payer: Self-pay | Admitting: Family Medicine

## 2017-03-03 NOTE — Telephone Encounter (Signed)
Cannot call in antibiotic. Will have to come in for nurse visit

## 2017-03-03 NOTE — Telephone Encounter (Signed)
Spoke with patients mother, she is aware that patient needs to come in for nurse visit
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
## 2017-03-10 ENCOUNTER — Ambulatory Visit: Payer: Medicaid Other | Admitting: Cardiology

## 2017-03-10 ENCOUNTER — Encounter: Payer: Self-pay | Admitting: Cardiology

## 2017-03-10 VITALS — BP 114/78 | HR 97 | Ht 65.0 in | Wt 337.2 lb

## 2017-03-10 DIAGNOSIS — G4733 Obstructive sleep apnea (adult) (pediatric): Secondary | ICD-10-CM

## 2017-03-10 DIAGNOSIS — E662 Morbid (severe) obesity with alveolar hypoventilation: Secondary | ICD-10-CM | POA: Diagnosis not present

## 2017-03-10 NOTE — Patient Instructions (Signed)
Medication Instructions:  Your physician recommends that you continue on your current medications as directed. Please refer to the Current Medication list given to you today.  Labwork: NONE  Testing/Procedures: NONE  Follow-Up: Your physician wants you to follow-up in: [place]. You will receive a reminder letter in the mail two months in advance. If you don't receive a letter, please call our office to schedule the follow-up appointment.  Any Other Special Instructions Will Be Listed Below (If Applicable).  If you need a refill on your cardiac medications before your next appointment, please call your pharmacy.

## 2017-03-14 ENCOUNTER — Ambulatory Visit: Payer: Medicaid Other | Admitting: Family Medicine

## 2017-03-17 ENCOUNTER — Emergency Department (HOSPITAL_COMMUNITY): Payer: Medicaid Other

## 2017-03-17 ENCOUNTER — Emergency Department (HOSPITAL_COMMUNITY)
Admission: EM | Admit: 2017-03-17 | Discharge: 2017-03-18 | Disposition: A | Discharge status: Home / Self Care | Payer: Medicaid Other | Attending: Emergency Medicine | Admitting: Emergency Medicine

## 2017-03-17 ENCOUNTER — Encounter (HOSPITAL_COMMUNITY): Payer: Self-pay | Admitting: *Deleted

## 2017-03-17 DIAGNOSIS — I10 Essential (primary) hypertension: Secondary | ICD-10-CM | POA: Insufficient documentation

## 2017-03-17 DIAGNOSIS — Z79899 Other long term (current) drug therapy: Secondary | ICD-10-CM | POA: Insufficient documentation

## 2017-03-17 DIAGNOSIS — J019 Acute sinusitis, unspecified: Secondary | ICD-10-CM | POA: Insufficient documentation

## 2017-03-17 DIAGNOSIS — F17210 Nicotine dependence, cigarettes, uncomplicated: Secondary | ICD-10-CM | POA: Insufficient documentation

## 2017-03-17 DIAGNOSIS — Z7984 Long term (current) use of oral hypoglycemic drugs: Secondary | ICD-10-CM | POA: Diagnosis not present

## 2017-03-17 DIAGNOSIS — J441 Chronic obstructive pulmonary disease with (acute) exacerbation: Secondary | ICD-10-CM | POA: Diagnosis not present

## 2017-03-17 DIAGNOSIS — R0602 Shortness of breath: Secondary | ICD-10-CM | POA: Diagnosis present

## 2017-03-17 DIAGNOSIS — J45909 Unspecified asthma, uncomplicated: Secondary | ICD-10-CM | POA: Insufficient documentation

## 2017-03-17 DIAGNOSIS — E119 Type 2 diabetes mellitus without complications: Secondary | ICD-10-CM | POA: Diagnosis not present
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
## 2017-03-17 NOTE — ED Triage Notes (Addendum)
Pt reports continued nasal congestion, sob, and cough with brown phlegm since he was seen here on January 4th.

## 2017-03-18 LAB — CBG MONITORING, ED: Glucose-Capillary: 96 mg/dL (ref 65–99)

## 2017-03-18 MED ORDER — PREDNISONE 20 MG PO TABS
40.0000 mg | ORAL_TABLET | Freq: Once | ORAL | Status: AC
  Administered 2017-03-18: 40 mg via ORAL
  Filled 2017-03-18: qty 2

## 2017-03-18 MED ORDER — ALBUTEROL SULFATE HFA 108 (90 BASE) MCG/ACT IN AERS
2.0000 | INHALATION_SPRAY | RESPIRATORY_TRACT | Status: DC | PRN
  Administered 2017-03-18: 2 via RESPIRATORY_TRACT
  Filled 2017-03-18: qty 6.7

## 2017-03-18 MED ORDER — DOXYCYCLINE HYCLATE 100 MG PO TABS
100.0000 mg | ORAL_TABLET | Freq: Once | ORAL | Status: AC
  Administered 2017-03-18: 100 mg via ORAL
  Filled 2017-03-18: qty 1

## 2017-03-18 MED ORDER — PREDNISONE 20 MG PO TABS: 40.0000 mg | ORAL_TABLET | Freq: Every day | ORAL | 0 refills | Status: DC

## 2017-03-18 MED ORDER — DOXYCYCLINE HYCLATE 100 MG PO CAPS: 100.0000 mg | ORAL_CAPSULE | Freq: Two times a day (BID) | ORAL | 0 refills | Status: DC
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
## 2017-04-13 ENCOUNTER — Other Ambulatory Visit: Payer: Self-pay | Admitting: Family Medicine

## 2017-04-17 ENCOUNTER — Ambulatory Visit (INDEPENDENT_AMBULATORY_CARE_PROVIDER_SITE_OTHER): Payer: Medicaid Other | Admitting: Psychiatry

## 2017-04-17 ENCOUNTER — Encounter (HOSPITAL_COMMUNITY): Payer: Self-pay | Admitting: Psychiatry

## 2017-04-17 VITALS — BP 145/85 | HR 88 | Ht 65.0 in | Wt 337.0 lb

## 2017-04-17 DIAGNOSIS — F79 Unspecified intellectual disabilities: Secondary | ICD-10-CM | POA: Diagnosis not present

## 2017-04-17 DIAGNOSIS — F4310 Post-traumatic stress disorder, unspecified: Secondary | ICD-10-CM | POA: Diagnosis not present

## 2017-04-17 DIAGNOSIS — Z818 Family history of other mental and behavioral disorders: Secondary | ICD-10-CM | POA: Diagnosis not present

## 2017-04-17 DIAGNOSIS — Z560 Unemployment, unspecified: Secondary | ICD-10-CM

## 2017-04-17 DIAGNOSIS — F09 Unspecified mental disorder due to known physiological condition: Secondary | ICD-10-CM | POA: Diagnosis not present

## 2017-04-17 DIAGNOSIS — Z736 Limitation of activities due to disability: Secondary | ICD-10-CM

## 2017-04-17 DIAGNOSIS — G4700 Insomnia, unspecified: Secondary | ICD-10-CM | POA: Diagnosis not present

## 2017-04-17 DIAGNOSIS — R450 Nervousness: Secondary | ICD-10-CM | POA: Diagnosis not present

## 2017-04-17 DIAGNOSIS — F17210 Nicotine dependence, cigarettes, uncomplicated: Secondary | ICD-10-CM | POA: Diagnosis not present

## 2017-04-17 DIAGNOSIS — F419 Anxiety disorder, unspecified: Secondary | ICD-10-CM | POA: Diagnosis not present

## 2017-04-17 DIAGNOSIS — F29 Unspecified psychosis not due to a substance or known physiological condition: Secondary | ICD-10-CM

## 2017-04-17 MED ORDER — ARIPIPRAZOLE 2 MG PO TABS: 2.0000 mg | ORAL_TABLET | Freq: Every day | ORAL | 0 refills | Status: DC

## 2017-04-17 NOTE — Patient Instructions (Signed)
1. Start Abilify 2 mg daily  2. Return to clinic in one month for 30 mins

## 2017-04-25 IMAGING — CR DG CHEST 1V PORT
1 series · 1 of 1 positions shown · non-contrast
Comparison: 12/21/2014 chest radiograph.

CLINICAL DATA: Shortness of breath

EXAM:
PORTABLE CHEST 1 VIEW

[ap portable]
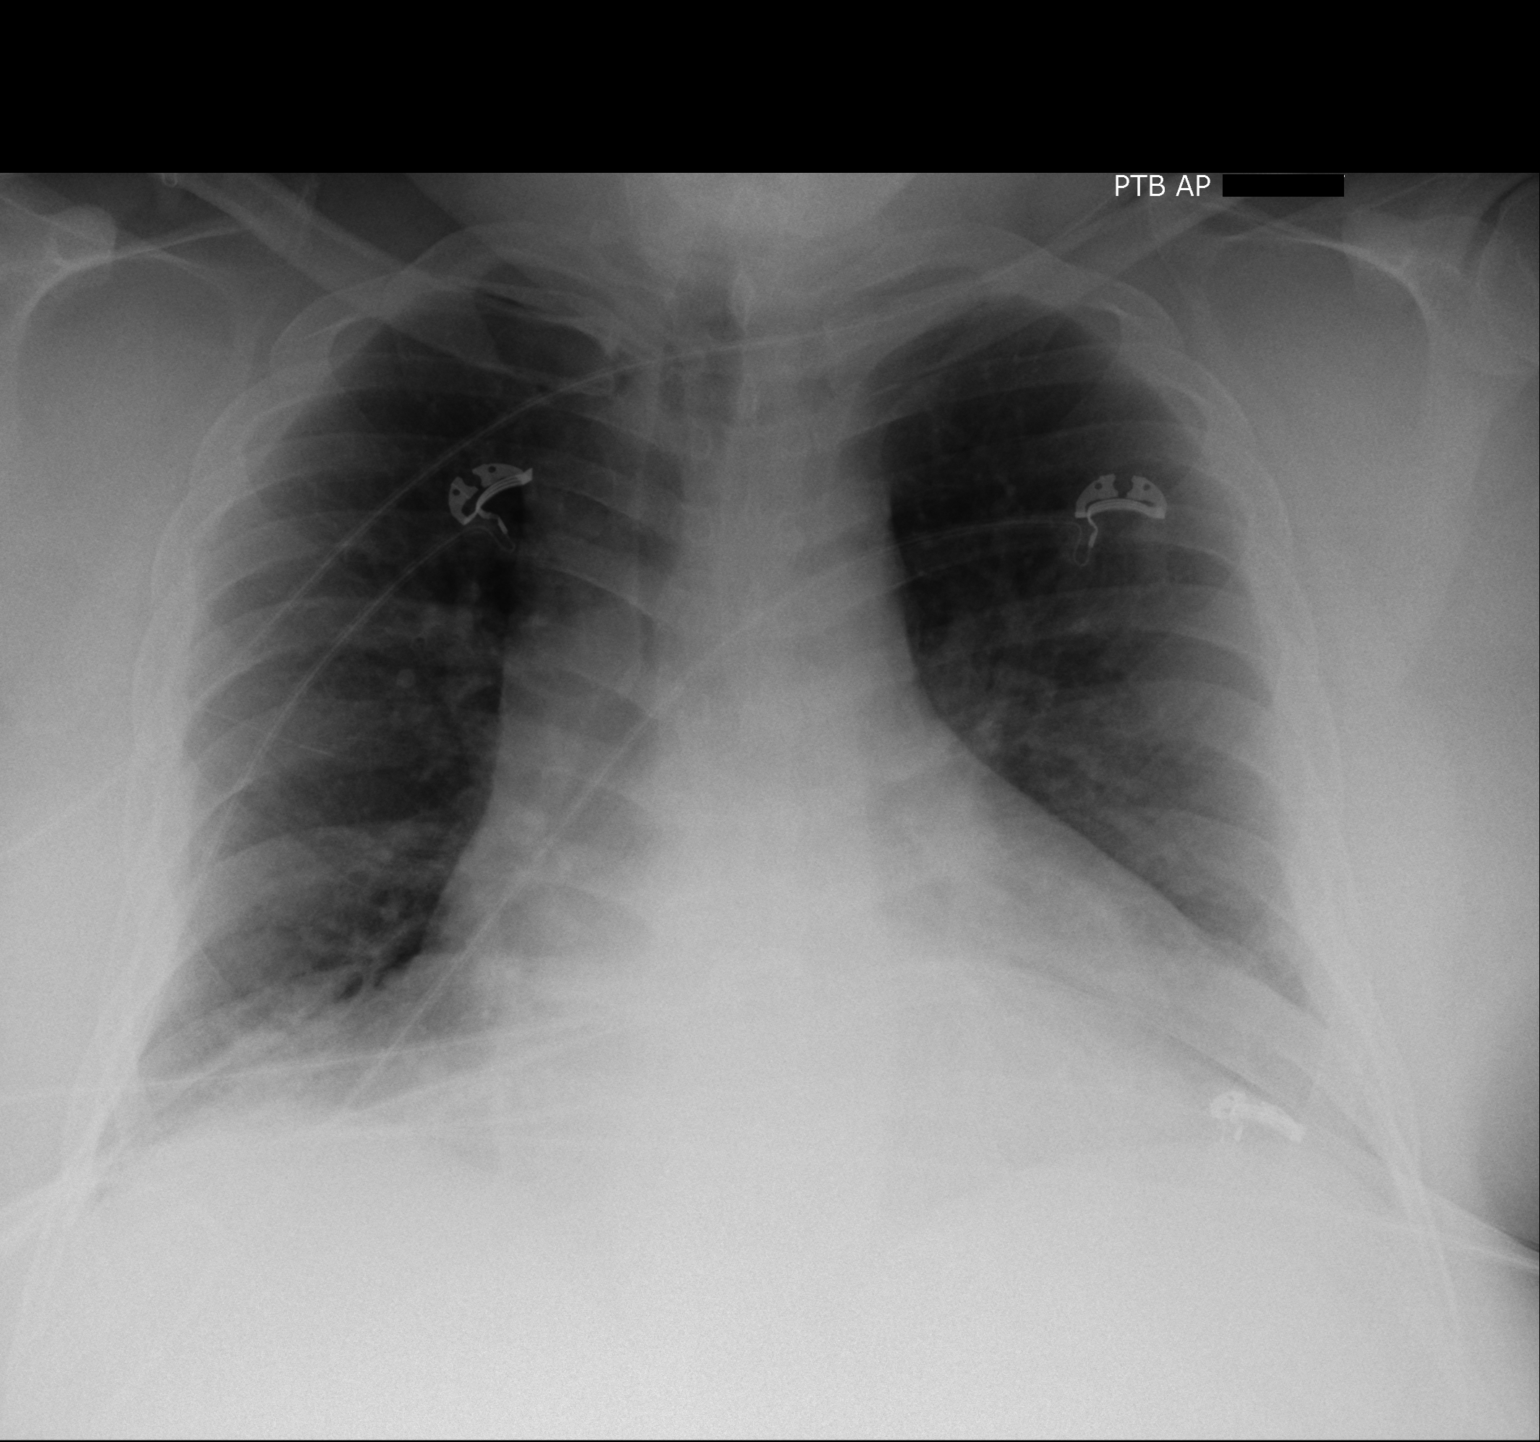

[1 of 1 positions shown; findings below may reference images not displayed]

FINDINGS: Stable cardiomediastinal silhouette with mild cardiomegaly. No
pneumothorax. No pleural effusion. No pulmonary edema. Mild
curvilinear opacity at the right lung base, favor mild atelectasis .
IMPRESSION: 1. Stable mild cardiomegaly without pulmonary edema.
2. Mild curvilinear right lung base opacity, favor mild atelectasis.
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
## 2017-05-16 ENCOUNTER — Encounter (HOSPITAL_COMMUNITY): Payer: Self-pay | Admitting: Psychiatry

## 2017-05-16 ENCOUNTER — Ambulatory Visit (INDEPENDENT_AMBULATORY_CARE_PROVIDER_SITE_OTHER): Payer: Medicaid Other | Admitting: Psychiatry

## 2017-05-16 VITALS — BP 124/81 | HR 79 | Ht 65.0 in | Wt 334.0 lb

## 2017-05-16 DIAGNOSIS — F29 Unspecified psychosis not due to a substance or known physiological condition: Secondary | ICD-10-CM | POA: Diagnosis not present

## 2017-05-16 DIAGNOSIS — Z818 Family history of other mental and behavioral disorders: Secondary | ICD-10-CM

## 2017-05-16 DIAGNOSIS — G4700 Insomnia, unspecified: Secondary | ICD-10-CM

## 2017-05-16 DIAGNOSIS — Z560 Unemployment, unspecified: Secondary | ICD-10-CM

## 2017-05-16 DIAGNOSIS — F17210 Nicotine dependence, cigarettes, uncomplicated: Secondary | ICD-10-CM

## 2017-05-16 DIAGNOSIS — F79 Unspecified intellectual disabilities: Secondary | ICD-10-CM

## 2017-05-16 MED ORDER — ARIPIPRAZOLE 2 MG PO TABS: 2.0000 mg | ORAL_TABLET | Freq: Every day | ORAL | 0 refills | Status: DC

## 2017-05-16 NOTE — Patient Instructions (Signed)
1. Continue Abilify 2 mg at night 2. Return to clinic in three months for 15 mins

## 2017-05-30 ENCOUNTER — Ambulatory Visit: Payer: Medicaid Other | Admitting: Family Medicine

## 2017-06-01 ENCOUNTER — Telehealth: Payer: Self-pay | Admitting: Family Medicine

## 2017-06-01 NOTE — Telephone Encounter (Signed)
Called patient regarding message below. No answer, unable to leave message.  

## 2017-06-01 NOTE — Telephone Encounter (Signed)
Patient left a voicemail that was hard to understand. I tried to call back, someone answered but it was a loud noise and no voicemail.

## 2017-06-26 ENCOUNTER — Telehealth: Payer: Self-pay | Admitting: Family Medicine

## 2017-06-26 ENCOUNTER — Telehealth: Payer: Self-pay

## 2017-06-26 ENCOUNTER — Other Ambulatory Visit: Payer: Self-pay

## 2017-06-26 MED ORDER — POTASSIUM CHLORIDE CRYS ER 20 MEQ PO TBCR: 20.0000 meq | EXTENDED_RELEASE_TABLET | Freq: Every day | ORAL | 2 refills | Status: DC

## 2017-06-26 NOTE — Telephone Encounter (Signed)
Requesting refill of potassium but I only see the one that was sent by hospitalist back last July. What should he be on?

## 2017-06-26 NOTE — Telephone Encounter (Signed)
done

## 2017-06-26 NOTE — Telephone Encounter (Signed)
Please send potassium pills to [facility] in [place]

## 2017-07-18 ENCOUNTER — Telehealth: Payer: Self-pay | Admitting: Family Medicine

## 2017-07-18 NOTE — Telephone Encounter (Signed)
Patients wife called in to request metformin refill be sent to [facility]. Cb#: [id number]/ [phone]

## 2017-07-20 ENCOUNTER — Other Ambulatory Visit: Payer: Self-pay

## 2017-07-20 MED ORDER — METFORMIN HCL ER 500 MG PO TB24: 500.0000 mg | ORAL_TABLET | Freq: Every day | ORAL | 1 refills | Status: DC

## 2017-07-20 NOTE — Telephone Encounter (Signed)
Pt med refilled 
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
## 2017-08-16 ENCOUNTER — Encounter (HOSPITAL_COMMUNITY): Payer: Self-pay | Admitting: Psychiatry

## 2017-08-16 ENCOUNTER — Ambulatory Visit (INDEPENDENT_AMBULATORY_CARE_PROVIDER_SITE_OTHER): Payer: Medicaid Other | Admitting: Psychiatry

## 2017-08-16 VITALS — BP 112/67 | HR 103 | Ht 65.0 in | Wt 349.0 lb

## 2017-08-16 DIAGNOSIS — F29 Unspecified psychosis not due to a substance or known physiological condition: Secondary | ICD-10-CM | POA: Diagnosis not present

## 2017-08-16 MED ORDER — ARIPIPRAZOLE 2 MG PO TABS: 2.0000 mg | ORAL_TABLET | Freq: Every day | ORAL | 0 refills | Status: DC

## 2017-08-16 NOTE — Patient Instructions (Signed)
1. Continue Abilify 2 mg at night 2. Return to clinic in three months for 15 mins 3. Consider discussing with your primary care about your sleep

## 2017-09-01 ENCOUNTER — Telehealth: Payer: Self-pay | Admitting: Family Medicine

## 2017-09-01 ENCOUNTER — Other Ambulatory Visit: Payer: Self-pay

## 2017-09-01 MED ORDER — ATORVASTATIN CALCIUM 40 MG PO TABS: 40.0000 mg | ORAL_TABLET | Freq: Every day | ORAL | 0 refills | Status: DC

## 2017-09-01 NOTE — Telephone Encounter (Signed)
Can you call the pt and advise if he is taken calcium pills

## 2017-09-01 NOTE — Telephone Encounter (Signed)
Patient's atorvastatin refilled.Patient notified.

## 2017-09-05 ENCOUNTER — Ambulatory Visit: Payer: Self-pay | Admitting: Family Medicine

## 2017-09-13 ENCOUNTER — Ambulatory Visit (INDEPENDENT_AMBULATORY_CARE_PROVIDER_SITE_OTHER): Payer: Medicaid Other | Admitting: Family Medicine

## 2017-09-13 ENCOUNTER — Encounter: Payer: Self-pay | Admitting: Family Medicine

## 2017-09-13 ENCOUNTER — Other Ambulatory Visit: Payer: Self-pay

## 2017-09-13 VITALS — BP 112/60 | HR 93 | Resp 14 | Ht 65.0 in | Wt 353.0 lb

## 2017-09-13 DIAGNOSIS — Z6841 Body Mass Index (BMI) 40.0 and over, adult: Secondary | ICD-10-CM

## 2017-09-13 DIAGNOSIS — E559 Vitamin D deficiency, unspecified: Secondary | ICD-10-CM

## 2017-09-13 DIAGNOSIS — E669 Obesity, unspecified: Secondary | ICD-10-CM

## 2017-09-13 DIAGNOSIS — E1169 Type 2 diabetes mellitus with other specified complication: Secondary | ICD-10-CM | POA: Diagnosis not present

## 2017-09-13 DIAGNOSIS — E785 Hyperlipidemia, unspecified: Secondary | ICD-10-CM | POA: Diagnosis not present

## 2017-09-13 NOTE — Patient Instructions (Addendum)
F/U in 2 months,  Call if you need me sooner  Labs needed,asap fasting  lipid, cmp and EGFr, HBA1C, TSH and vit D, after labs available will prescribe half phentermine daily for 2 months only   Need to change food chouce and drink only water and eat on a regular schedule 

## 2017-09-15 ENCOUNTER — Encounter: Payer: Self-pay | Admitting: Family Medicine

## 2017-09-15 DIAGNOSIS — E559 Vitamin D deficiency, unspecified: Secondary | ICD-10-CM | POA: Insufficient documentation

## 2017-09-15 LAB — COMPLETE METABOLIC PANEL WITH GFR
AG Ratio: 1.5 (calc) (ref 1.0–2.5)
ALT: 16 U/L (ref 9–46)
AST: 13 U/L (ref 10–40)
Albumin: 4.4 g/dL (ref 3.6–5.1)
Alkaline phosphatase (APISO): 68 U/L (ref 40–115)
BUN: 14 mg/dL (ref 7–25)
CO2: 30 mmol/L (ref 20–32)
Calcium: 9.1 mg/dL (ref 8.6–10.3)
Chloride: 103 mmol/L (ref 98–110)
Creat: 0.75 mg/dL (ref 0.60–1.35)
GFR, Est African American: 136 mL/min/{1.73_m2} (ref >=60)
GFR, Est Non African American: 117 mL/min/{1.73_m2} (ref >=60)
Globulin: 2.9 g/dL (calc) (ref 1.9–3.7)
Glucose, Bld: 91 mg/dL (ref 65–99)
Potassium: 4.5 mmol/L (ref 3.5–5.3)
Sodium: 141 mmol/L (ref 135–146)
Total Bilirubin: 0.3 mg/dL (ref 0.2–1.2)
Total Protein: 7.3 g/dL (ref 6.1–8.1)

## 2017-09-15 LAB — VITAMIN D 25 HYDROXY (VIT D DEFICIENCY, FRACTURES): Vit D, 25-Hydroxy: 7 ng/mL — ABNORMAL LOW (ref 30–100)

## 2017-09-15 LAB — TSH: TSH: 1.45 mIU/L (ref 0.40–4.50)

## 2017-09-15 LAB — LIPID PANEL
Cholesterol: 135 mg/dL (ref <200)
HDL: 33 mg/dL — ABNORMAL LOW (ref >40)
LDL Cholesterol (Calc): 85 mg/dL (calc)
Non-HDL Cholesterol (Calc): 102 mg/dL (calc) (ref <130)
Total CHOL/HDL Ratio: 4.1 (calc) (ref <5.0)
Triglycerides: 84 mg/dL (ref <150)

## 2017-09-15 LAB — HEMOGLOBIN A1C
Hgb A1c MFr Bld: 6.4 % of total Hgb — ABNORMAL HIGH (ref <5.7)
Mean Plasma Glucose: 137 (calc)
eAG (mmol/L): 7.6 (calc)

## 2017-09-15 MED ORDER — PHENTERMINE HCL 37.5 MG PO TABS: ORAL_TABLET | ORAL | 3 refills | Status: DC

## 2017-09-15 MED ORDER — ERGOCALCIFEROL 1.25 MG (50000 UT) PO CAPS: 50000.0000 [IU] | ORAL_CAPSULE | ORAL | 1 refills | Status: DC

## 2017-09-15 NOTE — Assessment & Plan Note (Signed)
Level is 7 in 08/2017 Needs to commit to at least 6 months of trreatment

## 2017-09-15 NOTE — Assessment & Plan Note (Signed)
Deteriorated. Patient re-educated about  the importance of commitment to a  minimum of 150 minutes of exercise per week.  The importance of healthy food choices with portion control discussed. Encouraged to start a food diary, count calories and to consider  joining a support group. Sample diet sheets offered. Goals set by the patient for the next several months.   Weight /BMI 09/13/2017 03/17/2017 03/10/2017  WEIGHT 353 lb 329 lb 337 lb 3.2 oz  HEIGHT 5\' 5"  5\' 5"  5\' 5"   BMI 58.74 kg/m2 54.75 kg/m2 56.11 kg/m2  Some encounter information is confidential and restricted. Go to Review Flowsheets activity to see all data.   Start phentermine half daily weight ;loss goal of 5 pounds per month

## 2017-09-15 NOTE — Assessment & Plan Note (Signed)
Controlled, no change in medication Shawn Ramsey is reminded of the importance of commitment to daily physical activity for 30 minutes or more, as able and the need to limit carbohydrate intake to 30 to 60 grams per meal to help with blood sugar control.   The need to take medication as prescribed, test blood sugar as directed, and to call between visits if there is a concern that blood sugar is uncontrolled is also discussed.   Shawn Ramsey is reminded of the importance of daily foot exam, annual eye examination, and good blood sugar, blood pressure and cholesterol control.  Diabetic Labs Latest Ref Rng & Units 09/14/2017 01/26/2017 09/19/2016 09/10/2016 09/09/2016  HbA1c <5.7 % of total Hgb 6.4(H) 5.8(H) 6.8(H) - -  Microalbumin Not estab mg/dL - - 0.4 - -  Micro/Creat Ratio <30 mcg/mg creat - - 2 - -  Chol <200 mg/dL 135 161 - - -  HDL >40 mg/dL 33(L) 35(L) - - -  Calc LDL mg/dL (calc) 85 98 - - -  Triglycerides <150 mg/dL 84 184(H) - - -  Creatinine 0.60 - 1.35 mg/dL 0.75 0.80 0.74 0.67 0.85   BP/Weight 09/13/2017 03/18/2017 03/17/2017 03/10/2017 02/24/2017 01/26/2017 12/82/0813  Systolic BP 887 195 - 974 718 550 158  Diastolic BP 60 98 - 78 59 70 53  Wt. (Lbs) 353 - 329 337.2 330 325 -  BMI 58.74 - 54.75 56.11 54.91 54.08 -  Some encounter information is confidential and restricted. Go to Review Flowsheets activity to see all data.   Foot/eye exam completion dates 09/19/2016  Foot Form Completion Done

## 2017-09-15 NOTE — Assessment & Plan Note (Signed)
Hyperlipidemia:Low fat diet discussed and encouraged.   Lipid Panel  Lab Results  Component Value Date   CHOL 135 09/14/2017   HDL 33 (L) 09/14/2017   LDLCALC 85 09/14/2017   TRIG 84 09/14/2017   CHOLHDL 4.1 09/14/2017   Need to commit to regular exercise to improve HDL continue current medication

## 2017-09-15 NOTE — Progress Notes (Signed)
CALYX HAWKER     MRN: [id number]      DOB: 07-Sep-1980   HPI Mr. Kelty is here for follow up and re-evaluation of chronic medical conditions, medication management and review of any available recent lab and radiology data.  Preventive health is updated, specifically  Cancer screening and Immunization.   Questions or concerns regarding consultations or procedures which the PT has had in the interim are  addressed. The PT denies any adverse reactions to current medications since the last visit.  He has  gained a lot of weight , has started slobbering again , and wants an appetite suppressant to help him to lose weight once more ROS Denies recent fever or chills. Denies sinus pressure, nasal congestion, ear pain or sore throat. Denies chest congestion, productive cough or wheezing. Denies chest pains, palpitations and leg swelling Denies abdominal pain, nausea, vomiting,diarrhea or constipation.   Denies dysuria, frequency, hesitancy or incontinence. C/o  limitation in mobility. Denies headaches, seizures, numbness, or tingling. Denies depression, anxiety or insomnia. Denies skin break down or rash.   PE  BP 112/60 (BP Location: Left Arm, Patient Position: Sitting, Cuff Size: Large)   Pulse 93   Ht 5\' 5"  (1.651 m)   Wt (!) 353 lb (160.1 kg)   SpO2 94%   BMI 58.74 kg/m   Patient alert and oriented and in no cardiopulmonary distress.  HEENT: No facial asymmetry, EOMI,   oropharynx pink and moist.  Neck supple no JVD, no mass.  Chest: Clear to auscultation bilaterally.  CVS: S1, S2 no murmurs, no S3.Regular rate.  ABD: Soft non tender.   Ext: No edema  MS: decreased  ROM spine, shoulders, hips and knees.  Skin: Intact, no ulcerations or rash noted.  Psych: Good eye contact, normal affect. Memory intact not anxious or depressed appearing.  CNS: CN 2-12 intact, power,  normal throughout.no focal deficits noted.   Assessment & Plan  Diabetes mellitus type 2 in  obese ([facility]) Controlled, no change in medication Mr. Tillis is reminded of the importance of commitment to daily physical activity for 30 minutes or more, as able and the need to limit carbohydrate intake to 30 to 60 grams per meal to help with blood sugar control.   The need to take medication as prescribed, test blood sugar as directed, and to call between visits if there is a concern that blood sugar is uncontrolled is also discussed.   Mr. Brisk is reminded of the importance of daily foot exam, annual eye examination, and good blood sugar, blood pressure and cholesterol control.  Diabetic Labs Latest Ref Rng & Units 09/14/2017 01/26/2017 09/19/2016 09/10/2016 09/09/2016  HbA1c <5.7 % of total Hgb 6.4(H) 5.8(H) 6.8(H) - -  Microalbumin Not estab mg/dL - - 0.4 - -  Micro/Creat Ratio <30 mcg/mg creat - - 2 - -  Chol <200 mg/dL 135 161 - - -  HDL >40 mg/dL 33(L) 35(L) - - -  Calc LDL mg/dL (calc) 85 98 - - -  Triglycerides <150 mg/dL 84 184(H) - - -  Creatinine 0.60 - 1.35 mg/dL 0.75 0.80 0.74 0.67 0.85   BP/Weight 09/13/2017 03/18/2017 03/17/2017 03/10/2017 02/24/2017 01/26/2017 73/71/0626  Systolic BP 948 546 - 270 350 093 818  Diastolic BP 60 98 - 78 59 70 53  Wt. (Lbs) 353 - 329 337.2 330 325 -  BMI 58.74 - 54.75 56.11 54.91 54.08 -  Some encounter information is confidential and restricted. Go to Review Flowsheets activity to  see all data.   Foot/eye exam completion dates 09/19/2016  Foot Form Completion Done         Morbid obesity with BMI of 60.0-69.9, adult ([place]) Deteriorated. Patient re-educated about  the importance of commitment to a  minimum of 150 minutes of exercise per week.  The importance of healthy food choices with portion control discussed. Encouraged to start a food diary, count calories and to consider  joining a support group. Sample diet sheets offered. Goals set by the patient for the next several months.   Weight /BMI 09/13/2017 03/17/2017 03/10/2017  WEIGHT 353  lb 329 lb 337 lb 3.2 oz  HEIGHT 5\' 5"  5\' 5"  5\' 5"   BMI 58.74 kg/m2 54.75 kg/m2 56.11 kg/m2  Some encounter information is confidential and restricted. Go to Review Flowsheets activity to see all data.   Start phentermine half daily weight ;loss goal of 5 pounds per month   Dyslipidemia Hyperlipidemia:Low fat diet discussed and encouraged.   Lipid Panel  Lab Results  Component Value Date   CHOL 135 09/14/2017   HDL 33 (L) 09/14/2017   LDLCALC 85 09/14/2017   TRIG 84 09/14/2017   CHOLHDL 4.1 09/14/2017   Need to commit to regular exercise to improve HDL continue current medication    Vitamin D deficiency Level is 7 in 08/2017 Needs to commit to at least 6 months of trreatment
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
## 2017-11-09 ENCOUNTER — Ambulatory Visit: Payer: Self-pay | Admitting: Family Medicine

## 2017-11-13 ENCOUNTER — Ambulatory Visit (HOSPITAL_COMMUNITY): Payer: Self-pay | Admitting: Psychiatry

## 2017-11-15 ENCOUNTER — Other Ambulatory Visit: Payer: Self-pay

## 2017-11-15 ENCOUNTER — Encounter (HOSPITAL_COMMUNITY): Payer: Self-pay | Admitting: Emergency Medicine

## 2017-11-15 ENCOUNTER — Emergency Department (HOSPITAL_COMMUNITY): Payer: Medicaid Other

## 2017-11-15 ENCOUNTER — Emergency Department (HOSPITAL_COMMUNITY)
Admission: EM | Admit: 2017-11-15 | Discharge: 2017-11-15 | Disposition: A | Discharge status: Home / Self Care | Payer: Medicaid Other | Attending: Emergency Medicine | Admitting: Emergency Medicine

## 2017-11-15 DIAGNOSIS — R519 Headache, unspecified: Secondary | ICD-10-CM

## 2017-11-15 DIAGNOSIS — E119 Type 2 diabetes mellitus without complications: Secondary | ICD-10-CM | POA: Insufficient documentation

## 2017-11-15 DIAGNOSIS — R05 Cough: Secondary | ICD-10-CM | POA: Diagnosis present

## 2017-11-15 DIAGNOSIS — J45901 Unspecified asthma with (acute) exacerbation: Secondary | ICD-10-CM | POA: Diagnosis not present

## 2017-11-15 DIAGNOSIS — J441 Chronic obstructive pulmonary disease with (acute) exacerbation: Secondary | ICD-10-CM

## 2017-11-15 DIAGNOSIS — Z79899 Other long term (current) drug therapy: Secondary | ICD-10-CM | POA: Diagnosis not present

## 2017-11-15 DIAGNOSIS — R51 Headache: Secondary | ICD-10-CM

## 2017-11-15 DIAGNOSIS — F17210 Nicotine dependence, cigarettes, uncomplicated: Secondary | ICD-10-CM | POA: Insufficient documentation

## 2017-11-15 LAB — CBG MONITORING, ED: Glucose-Capillary: 109 mg/dL — ABNORMAL HIGH (ref 70–99)

## 2017-11-15 MED ORDER — PREDNISONE 20 MG PO TABS
40.0000 mg | ORAL_TABLET | Freq: Once | ORAL | Status: AC
  Administered 2017-11-15: 40 mg via ORAL
  Filled 2017-11-15: qty 2

## 2017-11-15 MED ORDER — DOXYCYCLINE HYCLATE 100 MG PO TABS
100.0000 mg | ORAL_TABLET | Freq: Once | ORAL | Status: AC
  Administered 2017-11-15: 100 mg via ORAL
  Filled 2017-11-15: qty 1

## 2017-11-15 MED ORDER — PREDNISONE 20 MG PO TABS: 40.0000 mg | ORAL_TABLET | Freq: Every day | ORAL | 0 refills | Status: DC

## 2017-11-15 MED ORDER — DOXYCYCLINE HYCLATE 100 MG PO CAPS: 100.0000 mg | ORAL_CAPSULE | Freq: Two times a day (BID) | ORAL | 0 refills | Status: DC

## 2017-11-15 MED ORDER — ALBUTEROL SULFATE HFA 108 (90 BASE) MCG/ACT IN AERS: 1.0000 | INHALATION_SPRAY | RESPIRATORY_TRACT | 0 refills | Status: DC | PRN
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
## 2017-11-15 NOTE — ED Triage Notes (Addendum)
Pt reports headache and intermittent shortness of breath with exertion x1 week. Pt denies any chest pain. Reports productive cough. Upon auscultation clear lung sounds noted in all fields.

## 2017-11-15 NOTE — Discharge Instructions (Signed)
Your blood pressure is slightly elevated at 159/67.  Your oxygen level is within normal limits at 95.  Your examination favors an exacerbation of your COPD, worsened by a sinus infection.  Please use Decadron 2 times daily with food.  Please use prednisone 40 mg daily for 5 days.  Afrin spray in each nostril for 5 days only may be helpful.  Please use this medication for 5 days only.  Use Tylenol extra strength for your headache.  Please see Dr. [name] in the office for follow-up of your conditions.  Please use 2 puffs of albuterol every 4 hours as needed for wheezing or difficulty with breathing.  Please use your CPAP consistently, and your oxygen as previously prescribed.  Please return to the emergency department if any changes in your condition, problems, or concerns.

## 2017-11-21 ENCOUNTER — Ambulatory Visit (INDEPENDENT_AMBULATORY_CARE_PROVIDER_SITE_OTHER): Payer: Medicaid Other | Admitting: Psychiatry

## 2017-11-21 ENCOUNTER — Encounter (HOSPITAL_COMMUNITY): Payer: Self-pay | Admitting: Psychiatry

## 2017-11-21 VITALS — BP 134/76 | HR 103 | Ht 65.0 in | Wt 348.0 lb

## 2017-11-21 DIAGNOSIS — F17210 Nicotine dependence, cigarettes, uncomplicated: Secondary | ICD-10-CM

## 2017-11-21 DIAGNOSIS — F79 Unspecified intellectual disabilities: Secondary | ICD-10-CM

## 2017-11-21 DIAGNOSIS — Z560 Unemployment, unspecified: Secondary | ICD-10-CM | POA: Diagnosis not present

## 2017-11-21 DIAGNOSIS — G4733 Obstructive sleep apnea (adult) (pediatric): Secondary | ICD-10-CM | POA: Diagnosis not present

## 2017-11-21 MED ORDER — ARIPIPRAZOLE 2 MG PO TABS: 2.0000 mg | ORAL_TABLET | Freq: Every day | ORAL | 0 refills | Status: DC

## 2017-11-21 NOTE — Patient Instructions (Addendum)
1.ContinueAbilify 2 mg at night 2. Return to clinic in three months for 15 mins Bring the paperwork of evaluation

## 2017-12-04 ENCOUNTER — Other Ambulatory Visit: Payer: Self-pay | Admitting: Family Medicine

## 2017-12-07 ENCOUNTER — Ambulatory Visit: Payer: Self-pay | Admitting: Family Medicine

## 2018-01-09 ENCOUNTER — Encounter: Payer: Self-pay | Admitting: Family Medicine

## 2018-01-09 ENCOUNTER — Ambulatory Visit (INDEPENDENT_AMBULATORY_CARE_PROVIDER_SITE_OTHER): Payer: Medicaid Other | Admitting: Family Medicine

## 2018-01-09 VITALS — BP 120/78 | HR 105 | Resp 16 | Ht 65.0 in | Wt 355.1 lb

## 2018-01-09 DIAGNOSIS — E785 Hyperlipidemia, unspecified: Secondary | ICD-10-CM

## 2018-01-09 DIAGNOSIS — Z23 Encounter for immunization: Secondary | ICD-10-CM | POA: Diagnosis not present

## 2018-01-09 DIAGNOSIS — Z6841 Body Mass Index (BMI) 40.0 and over, adult: Secondary | ICD-10-CM

## 2018-01-09 DIAGNOSIS — I10 Essential (primary) hypertension: Secondary | ICD-10-CM

## 2018-01-09 DIAGNOSIS — F17210 Nicotine dependence, cigarettes, uncomplicated: Secondary | ICD-10-CM | POA: Diagnosis not present

## 2018-01-09 DIAGNOSIS — E1159 Type 2 diabetes mellitus with other circulatory complications: Secondary | ICD-10-CM

## 2018-01-09 DIAGNOSIS — F17200 Nicotine dependence, unspecified, uncomplicated: Secondary | ICD-10-CM | POA: Diagnosis not present

## 2018-01-09 MED ORDER — PHENTERMINE HCL 37.5 MG PO TABS: ORAL_TABLET | ORAL | 3 refills | Status: DC

## 2018-01-09 NOTE — Patient Instructions (Addendum)
F/U in 3 months, call if you need me before  Flu vaccine and foot exam today, please get someone to check your feet every  day  Half phentermine daily and stop the chips , fats foods and sweet drink/ juices   HBA1C, cmp and EGFR and lipid fasting first week in January Microalb from office today  It is important that you exercise regularly at least 30 minutes 5 times a week. If you develop chest pain, have severe difficulty breathing, or feel very tired, stop exercising immediately and seek medical attention   Please work on good  health habits so that your health will improve. 1. Commitment to daily physical activity for 30 to 60  minutes, if you are able to do this.  2. Commitment to wise food choices. Aim for half of your  food intake to be vegetable and fruit, one quarter starchy foods, and one quarter protein. Try to eat on a regular schedule  3 meals per day, snacking between meals should be limited to vegetables or fruits or small portions of nuts. 64 ounces of water per day is generally recommended, unless you have specific health conditions, like heart failure or kidney failure where you will need to limit fluid intake.  3. Commitment to sufficient and a  good quality of physical and mental rest daily, generally between 6 to 8 hours per day.  WITH PERSISTANCE AND PERSEVERANCE, THE IMPOSSIBLE , BECOMES THE NORM! Thanks for choosing [facility], we consider it a privelige to serve you. Need to [place]

## 2018-01-10 ENCOUNTER — Other Ambulatory Visit (HOSPITAL_COMMUNITY)
Admission: RE | Admit: 2018-01-10 | Discharge: 2018-01-10 | Disposition: A | Discharge status: Home / Self Care | Payer: Medicaid Other | Source: Other Acute Inpatient Hospital | Attending: Family Medicine | Admitting: Family Medicine

## 2018-01-10 DIAGNOSIS — F17210 Nicotine dependence, cigarettes, uncomplicated: Secondary | ICD-10-CM | POA: Insufficient documentation

## 2018-01-10 DIAGNOSIS — E1169 Type 2 diabetes mellitus with other specified complication: Secondary | ICD-10-CM | POA: Insufficient documentation

## 2018-01-10 DIAGNOSIS — I10 Essential (primary) hypertension: Secondary | ICD-10-CM | POA: Insufficient documentation

## 2018-01-10 DIAGNOSIS — Z6841 Body Mass Index (BMI) 40.0 and over, adult: Secondary | ICD-10-CM | POA: Diagnosis not present

## 2018-01-11 LAB — MICROALBUMIN / CREATININE URINE RATIO
Creatinine, Urine: 373.6 mg/dL
Microalb Creat Ratio: 7.4 mg/g creat (ref 0.0–30.0)
Microalb, Ur: 27.5 ug/mL — ABNORMAL HIGH

## 2018-01-14 ENCOUNTER — Encounter: Payer: Self-pay | Admitting: Family Medicine

## 2018-01-14 NOTE — Assessment & Plan Note (Signed)
Controlled, no change in medication DASH diet and commitment to daily physical activity for a minimum of 30 minutes discussed and encouraged, as a part of hypertension management. The importance of attaining a healthy weight is also discussed.  BP/Weight 01/09/2018 11/15/2017 09/13/2017 03/18/2017 03/17/2017 8/33/8250 06/23/9765  Systolic BP 341 937 902 409 - 735 329  Diastolic BP 78 83 60 98 - 78 59  Wt. (Lbs) 355.12 542 353 - 329 337.2 330  BMI 59.1 90.19 58.74 - 54.75 56.11 54.91  Some encounter information is confidential and restricted. Go to Review Flowsheets activity to see all data.

## 2018-01-14 NOTE — Assessment & Plan Note (Signed)
Mr. Gaza is reminded of the importance of commitment to daily physical activity for 30 minutes or more, as able and the need to limit carbohydrate intake to 30 to 60 grams per meal to help with blood sugar control.   The need to take medication as prescribed, test blood sugar as directed, and to call between visits if there is a concern that blood sugar is uncontrolled is also discussed.   Mr. Buller is reminded of the importance of daily foot exam, annual eye examination, and good blood sugar, blood pressure and cholesterol control. Updated lab needed at/ before next visit.   Diabetic Labs Latest Ref Rng & Units 01/10/2018 09/14/2017 01/26/2017 09/19/2016 09/10/2016  HbA1c <5.7 % of total Hgb - 6.4(H) 5.8(H) 6.8(H) -  Microalbumin Not Estab. ug/mL 27.5(H) - - 0.4 -  Micro/Creat Ratio 0.0 - 30.0 mg/g creat 7.4 - - 2 -  Chol <200 mg/dL - 135 161 - -  HDL >40 mg/dL - 33(L) 35(L) - -  Calc LDL mg/dL (calc) - 85 98 - -  Triglycerides <150 mg/dL - 84 184(H) - -  Creatinine 0.60 - 1.35 mg/dL - 0.75 0.80 0.74 0.67   BP/Weight 01/09/2018 11/15/2017 09/13/2017 03/18/2017 03/17/2017 3/38/2505 04/30/7671  Systolic BP 419 379 024 097 - 353 299  Diastolic BP 78 83 60 98 - 78 59  Wt. (Lbs) 355.12 542 353 - 329 337.2 330  BMI 59.1 90.19 58.74 - 54.75 56.11 54.91  Some encounter information is confidential and restricted. Go to Review Flowsheets activity to see all data.   Foot/eye exam completion dates 01/09/2018 09/19/2016  Foot Form Completion Done Done

## 2018-01-14 NOTE — Assessment & Plan Note (Signed)
Deteriorated. Patient re-educated about  the importance of commitment to a  minimum of 150 minutes of exercise per week.  The importance of healthy food choices with portion control discussed. Encouraged to start a food diary, count calories and to consider  joining a support group. Sample diet sheets offered. Goals set by the patient for the next several months.   Weight /BMI 01/09/2018 11/15/2017 09/13/2017  WEIGHT 355 lb 1.9 oz 542 lb 353 lb  HEIGHT 5\' 5"  5\' 5"  5\' 5"   BMI 59.1 kg/m2 90.19 kg/m2 58.74 kg/m2  Some encounter information is confidential and restricted. Go to Review Flowsheets activity to see all data.    Resume half phentermine daily, needs to stop unhealthy snacks

## 2018-01-14 NOTE — Progress Notes (Signed)
Shawn Ramsey     MRN: [id number]      DOB: 1980/12/29   HPI Shawn Ramsey is here for follow up and re-evaluation of chronic medical conditions, medication management and review of any available recent lab and radiology data.  Preventive health is updated, specifically  Cancer screening and Immunization.   Questions or concerns regarding consultations or procedures which the PT has had in the interim are  addressed. The PT denies any adverse reactions to current medications since the last visit.  Has resumed unhealthy food choice with marked weight gain , states he  Ready to and wANTS TO REVERSE THIS  ROS Denies recent fever or chills. Denies sinus pressure, nasal congestion, ear pain or sore throat. Denies chest congestion, productive cough or wheezing. Denies chest pains, palpitations and leg swelling Denies abdominal pain, nausea, vomiting,diarrhea or constipation.   Denies dysuria, frequency, hesitancy or incontinence. C/o chronic joint pain, swelling and limitation in mobility. Denies headaches, seizures, numbness, or tingling. Denies uncontrolled depression, anxiety or insomnia. Denies skin break down or rash.   PE  BP 120/78   Pulse (!) 105   Resp 16   Ht 5\' 5"  (1.651 m)   Wt (!) 355 lb 1.9 oz (161.1 kg)   SpO2 95% Comment: room air  BMI 59.10 kg/m   Patient alert and oriented and in no cardiopulmonary distress.  HEENT: No facial asymmetry, EOMI,   oropharynx pink and moist.  Neck supple no JVD, no mass.  Chest: Clear to auscultation bilaterally.  CVS: S1, S2 no murmurs, no S3.Regular rate.  ABD: Soft non tender.   Ext: No edema  MS: Adequate ROM spine, shoulders, hips and knees.  Skin: Intact, no ulcerations or rash noted.  Psych: Good eye contact, normal affect. Memory intact not anxious or depressed appearing.  CNS: CN 2-12 intact, power,  normal throughout.no focal deficits noted.   Assessment & Plan  Essential hypertension Controlled, no change  in medication DASH diet and commitment to daily physical activity for a minimum of 30 minutes discussed and encouraged, as a part of hypertension management. The importance of attaining a healthy weight is also discussed.  BP/Weight 01/09/2018 11/15/2017 09/13/2017 03/18/2017 03/17/2017 02/23/7251 07/27/4401  Systolic BP 474 259 563 875 - 643 329  Diastolic BP 78 83 60 98 - 78 59  Wt. (Lbs) 355.12 542 353 - 329 337.2 330  BMI 59.1 90.19 58.74 - 54.75 56.11 54.91  Some encounter information is confidential and restricted. Go to Review Flowsheets activity to see all data.       Morbid obesity with BMI of 60.0-69.9, adult (HCC) Deteriorated. Patient re-educated about  the importance of commitment to a  minimum of 150 minutes of exercise per week.  The importance of healthy food choices with portion control discussed. Encouraged to start a food diary, count calories and to consider  joining a support group. Sample diet sheets offered. Goals set by the patient for the next several months.   Weight /BMI 01/09/2018 11/15/2017 09/13/2017  WEIGHT 355 lb 1.9 oz 542 lb 353 lb  HEIGHT 5\' 5"  5\' 5"  5\' 5"   BMI 59.1 kg/m2 90.19 kg/m2 58.74 kg/m2  Some encounter information is confidential and restricted. Go to Review Flowsheets activity to see all data.    Resume half phentermine daily, needs to stop unhealthy snacks  NICOTINE ADDICTION Asked:confirms currently smokes cigarettes Assess: Unwilling to set quit date  but cutting back Advise: needs to QUIT to reduce risk of cancer, cardio  and cerebrovascular disease, already at increased CV risk Assist: counseled for 5 minutes and literature provided Arrange: follow up in 3 months   Diabetes mellitus type 2 in obese [facility]) Shawn Ramsey is reminded of the importance of commitment to daily physical activity for 30 minutes or more, as able and the need to limit carbohydrate intake to 30 to 60 grams per meal to help with blood sugar control.   The need to  take medication as prescribed, test blood sugar as directed, and to call between visits if there is a concern that blood sugar is uncontrolled is also discussed.   Shawn Ramsey is reminded of the importance of daily foot exam, annual eye examination, and good blood sugar, blood pressure and cholesterol control. Updated lab needed at/ before next visit.   Diabetic Labs Latest Ref Rng & Units 01/10/2018 09/14/2017 01/26/2017 09/19/2016 09/10/2016  HbA1c <5.7 % of total Hgb - 6.4(H) 5.8(H) 6.8(H) -  Microalbumin Not Estab. ug/mL 27.5(H) - - 0.4 -  Micro/Creat Ratio 0.0 - 30.0 mg/g creat 7.4 - - 2 -  Chol <200 mg/dL - 135 161 - -  HDL >40 mg/dL - 33(L) 35(L) - -  Calc LDL mg/dL (calc) - 85 98 - -  Triglycerides <150 mg/dL - 84 184(H) - -  Creatinine 0.60 - 1.35 mg/dL - 0.75 0.80 0.74 0.67   BP/Weight 01/09/2018 11/15/2017 09/13/2017 03/18/2017 03/17/2017 9/62/8366 04/02/4763  Systolic BP 465 035 465 681 - 275 170  Diastolic BP 78 83 60 98 - 78 59  Wt. (Lbs) 355.12 542 353 - 329 337.2 330  BMI 59.1 90.19 58.74 - 54.75 56.11 54.91  Some encounter information is confidential and restricted. Go to Review Flowsheets activity to see all data.   Foot/eye exam completion dates 01/09/2018 09/19/2016  Foot Form Completion Done Done

## 2018-01-14 NOTE — Assessment & Plan Note (Signed)
Asked:confirms currently smokes cigarettes Assess: Unwilling to set quit date  but cutting back Advise: needs to QUIT to reduce risk of cancer, cardio and cerebrovascular disease, already at increased CV risk Assist: counseled for 5 minutes and literature provided Arrange: follow up in 3 months

## 2018-02-05 ENCOUNTER — Emergency Department (HOSPITAL_COMMUNITY)
Admission: EM | Admit: 2018-02-05 | Discharge: 2018-02-06 | Disposition: A | Discharge status: Home / Self Care | Payer: Medicaid Other | Attending: Emergency Medicine | Admitting: Emergency Medicine

## 2018-02-05 DIAGNOSIS — Z202 Contact with and (suspected) exposure to infections with a predominantly sexual mode of transmission: Secondary | ICD-10-CM | POA: Insufficient documentation

## 2018-02-05 DIAGNOSIS — R0602 Shortness of breath: Secondary | ICD-10-CM | POA: Diagnosis present

## 2018-02-05 DIAGNOSIS — J209 Acute bronchitis, unspecified: Secondary | ICD-10-CM | POA: Diagnosis not present

## 2018-02-05 DIAGNOSIS — J449 Chronic obstructive pulmonary disease, unspecified: Secondary | ICD-10-CM | POA: Diagnosis not present

## 2018-02-05 NOTE — ED Triage Notes (Signed)
Pt states he has been short of breath x 1 week and has had a productive cough. Also wants STD check. States he has been having unprotected sex and girl stated she had one.

## 2018-02-06 ENCOUNTER — Emergency Department (HOSPITAL_COMMUNITY): Payer: Medicaid Other

## 2018-02-06 ENCOUNTER — Encounter (HOSPITAL_COMMUNITY): Payer: Self-pay | Admitting: Emergency Medicine

## 2018-02-06 ENCOUNTER — Encounter: Payer: Self-pay | Admitting: Family Medicine

## 2018-02-06 MED ORDER — METRONIDAZOLE 500 MG PO TABS
2000.0000 mg | ORAL_TABLET | Freq: Once | ORAL | Status: AC
  Administered 2018-02-06: 2000 mg via ORAL
  Filled 2018-02-06: qty 4

## 2018-02-06 MED ORDER — PREDNISONE 20 MG PO TABS: 40.0000 mg | ORAL_TABLET | Freq: Every day | ORAL | 0 refills | Status: DC

## 2018-02-06 MED ORDER — CEFTRIAXONE SODIUM 250 MG IJ SOLR
250.0000 mg | Freq: Once | INTRAMUSCULAR | Status: AC
  Administered 2018-02-06: 250 mg via INTRAMUSCULAR
  Filled 2018-02-06: qty 250

## 2018-02-06 MED ORDER — AZITHROMYCIN 250 MG PO TABS
1000.0000 mg | ORAL_TABLET | Freq: Once | ORAL | Status: AC
  Administered 2018-02-06: 1000 mg via ORAL
  Filled 2018-02-06: qty 4

## 2018-02-06 MED ORDER — PREDNISONE 50 MG PO TABS
60.0000 mg | ORAL_TABLET | Freq: Once | ORAL | Status: AC
  Administered 2018-02-06: 60 mg via ORAL
  Filled 2018-02-06: qty 1

## 2018-02-06 MED ORDER — LIDOCAINE HCL (PF) 1 % IJ SOLN
INTRAMUSCULAR | Status: AC
  Administered 2018-02-06: 2 mL
  Filled 2018-02-06: qty 2

## 2018-02-06 MED ORDER — BENZONATATE 100 MG PO CAPS: 100.0000 mg | ORAL_CAPSULE | Freq: Three times a day (TID) | ORAL | 0 refills | Status: DC | PRN

## 2018-02-06 MED ORDER — ALBUTEROL SULFATE HFA 108 (90 BASE) MCG/ACT IN AERS
2.0000 | INHALATION_SPRAY | RESPIRATORY_TRACT | Status: DC | PRN
  Administered 2018-02-06: 2 via RESPIRATORY_TRACT
  Filled 2018-02-06: qty 6.7

## 2018-02-06 NOTE — ED Provider Notes (Signed)
[facility] EMERGENCY DEPARTMENT Provider Note   CSN: [id number] Arrival date & time: 02/05/18  2341     History   Chief Complaint Chief Complaint  Patient presents with  . Shortness of Breath    HPI Shawn Ramsey is a 37 y.o. male.  Patient reports that he has been short of breath and experiencing cough for 1 week.  Cough has been productive of thick green sputum.  He reports that he has not been using his CPAP like he is supposed to.  He has not noticed any fever.  No chest pain.  Patient also reports that he needs an STD check because he has had unprotected sex with a male who told him that she has an STD.     Past Medical History:  Diagnosis Date  . Asthma   . COPD (chronic obstructive pulmonary disease) (HCC)     There are no active problems to display for this patient.   History reviewed. No pertinent surgical history.      Home Medications    Prior to Admission medications   Medication Sig Start Date End Date Taking? Authorizing Provider  benzonatate (TESSALON) 100 MG capsule Take 1 capsule (100 mg total) by mouth 3 (three) times daily as needed for cough. 02/06/18   Orpah Greek, MD  predniSONE (DELTASONE) 20 MG tablet Take 2 tablets (40 mg total) by mouth daily with breakfast. 02/06/18   Ekam Bonebrake, Gwenyth Allegra, MD    Family History No family history on file.  Social History Social History   Tobacco Use  . Smoking status: Never Smoker  . Smokeless tobacco: Never Used  Substance Use Topics  . Alcohol use: Yes  . Drug use: Never     Allergies   Patient has no allergy information on record.   Review of Systems Review of Systems  Respiratory: Positive for cough and shortness of breath.   All other systems reviewed and are negative.    Physical Exam Updated Vital Signs BP (!) 163/87   Pulse (!) 107   Temp 98.9 F (37.2 C) (Oral)   Resp 18   SpO2 95%   Physical Exam Vitals signs and nursing note reviewed.    Constitutional:      General: He is not in acute distress.    Appearance: Normal appearance. He is well-developed.  HENT:     Head: Normocephalic and atraumatic.     Right Ear: Hearing normal.     Left Ear: Hearing normal.     Nose: Nose normal.  Eyes:     Conjunctiva/sclera: Conjunctivae normal.     Pupils: Pupils are equal, round, and reactive to light.  Neck:     Musculoskeletal: Normal range of motion and neck supple.  Cardiovascular:     Rate and Rhythm: Regular rhythm.     Heart sounds: S1 normal and S2 normal. No murmur. No friction rub. No gallop.   Pulmonary:     Effort: Pulmonary effort is normal. No respiratory distress.     Breath sounds: Decreased breath sounds present.  Chest:     Chest wall: No tenderness.  Abdominal:     General: Bowel sounds are normal.     Palpations: Abdomen is soft.     Tenderness: There is no abdominal tenderness. There is no guarding or rebound. Negative signs include Murphy's sign and McBurney's sign.     Hernia: No hernia is present.  Musculoskeletal: Normal range of motion.  Skin:    General: Skin is  warm and dry.     Findings: No rash.  Neurological:     Mental Status: He is alert and oriented to person, place, and time.     GCS: GCS eye subscore is 4. GCS verbal subscore is 5. GCS motor subscore is 6.     Cranial Nerves: No cranial nerve deficit.     Sensory: No sensory deficit.     Coordination: Coordination normal.  Psychiatric:        Speech: Speech normal.        Behavior: Behavior normal.        Thought Content: Thought content normal.      ED Treatments / Results  Labs (all labs ordered are listed, but only abnormal results are displayed) Labs Reviewed - No data to display  EKG None  Radiology Dg Chest 2 View  Result Date: 02/06/2018 CLINICAL DATA:  Shortness of breath, productive cough for a week. History of COPD. EXAM: CHEST - 2 VIEW COMPARISON:  None. FINDINGS: Cardiac silhouette is upper limits of normal  size, mediastinal silhouette is unremarkable. Increased lung volumes with flattened hemidiaphragms. Mild bronchitic changes without pleural effusion or focal consolidation. No pneumothorax. Soft tissue planes included osseous structures are non suspicious; large body habitus. IMPRESSION: 1. Bronchitic changes and hyperinflation without focal consolidation. 2. Borderline cardiomegaly. Electronically Signed   By: Elon Alas M.D.   On: 02/06/2018 01:16    Procedures Procedures (including critical care time)  Medications Ordered in ED Medications  albuterol (PROVENTIL HFA;VENTOLIN HFA) 108 (90 Base) MCG/ACT inhaler 2 puff (has no administration in time range)  predniSONE (DELTASONE) tablet 60 mg (has no administration in time range)  cefTRIAXone (ROCEPHIN) injection 250 mg (250 mg Intramuscular Given 02/06/18 0043)  azithromycin (ZITHROMAX) tablet 1,000 mg (1,000 mg Oral Given 02/06/18 0044)  metroNIDAZOLE (FLAGYL) tablet 2,000 mg (2,000 mg Oral Given 02/06/18 0044)  lidocaine (PF) (XYLOCAINE) 1 % injection (2 mLs  Given 02/06/18 0044)     Initial Impression / Assessment and Plan / ED Course  I have reviewed the triage vital signs and the nursing notes.  Pertinent labs & imaging results that were available during my care of the patient were reviewed by me and considered in my medical decision making (see chart for details).     Patient presents to the emergency department with multiple complaints.  Patient reports cough, chest congestion for 1 week.  Cough is productive of green sputum.  Chest x-ray shows no evidence of pneumonia.  Patient does have mild bronchospasm.  Will initiate treatment with prednisone and albuterol.  Patient also concerned about STD check.  Currently no symptoms, will treat empirically.  Final Clinical Impressions(s) / ED Diagnoses   Final diagnoses:  Acute bronchitis, unspecified organism  STD exposure    ED Discharge Orders         Ordered     benzonatate (TESSALON) 100 MG capsule  3 times daily PRN     02/06/18 0122    predniSONE (DELTASONE) 20 MG tablet  Daily with breakfast     02/06/18 0122           Orpah Greek, MD 02/06/18 [phone]
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
## 2018-03-12 ENCOUNTER — Encounter (HOSPITAL_COMMUNITY): Payer: Self-pay | Admitting: Psychiatry

## 2018-03-12 ENCOUNTER — Ambulatory Visit (INDEPENDENT_AMBULATORY_CARE_PROVIDER_SITE_OTHER): Payer: Medicaid Other | Admitting: Psychiatry

## 2018-03-12 VITALS — BP 140/94 | HR 102 | Ht 65.0 in | Wt 344.0 lb

## 2018-03-12 DIAGNOSIS — F79 Unspecified intellectual disabilities: Secondary | ICD-10-CM | POA: Diagnosis not present

## 2018-03-12 MED ORDER — ARIPIPRAZOLE 2 MG PO TABS: 2.0000 mg | ORAL_TABLET | Freq: Every day | ORAL | 1 refills | Status: DC

## 2018-03-12 NOTE — Patient Instructions (Signed)
1.ContinueAbilify 2 mg at night 2. Return to clinic in four months for 15 mins

## 2018-04-05 ENCOUNTER — Telehealth: Payer: Self-pay | Admitting: *Deleted

## 2018-04-05 NOTE — Telephone Encounter (Signed)
Pts mother (Shawn Ramsey) who is on DPR called stating that Shawn Ramsey is going to get an apartment and he is in need of a home health aide. I asked if she had a preference as to the company and she said no. Dr [name] had gotten him one before.

## 2018-04-06 NOTE — Telephone Encounter (Signed)
Will address at appt next week,pls let her know

## 2018-04-06 NOTE — Telephone Encounter (Signed)
Spoke with patients mother and advised of Dr.Simpson's response with verbal understanding.

## 2018-04-11 ENCOUNTER — Encounter: Payer: Self-pay | Admitting: Family Medicine

## 2018-04-11 ENCOUNTER — Ambulatory Visit (INDEPENDENT_AMBULATORY_CARE_PROVIDER_SITE_OTHER): Payer: Medicaid Other | Admitting: Family Medicine

## 2018-04-11 VITALS — BP 128/62 | HR 96 | Resp 16 | Ht 65.0 in | Wt 351.1 lb

## 2018-04-11 DIAGNOSIS — G4734 Idiopathic sleep related nonobstructive alveolar hypoventilation: Secondary | ICD-10-CM | POA: Diagnosis not present

## 2018-04-11 DIAGNOSIS — G4733 Obstructive sleep apnea (adult) (pediatric): Secondary | ICD-10-CM

## 2018-04-11 DIAGNOSIS — E1169 Type 2 diabetes mellitus with other specified complication: Secondary | ICD-10-CM | POA: Diagnosis not present

## 2018-04-11 DIAGNOSIS — E669 Obesity, unspecified: Secondary | ICD-10-CM

## 2018-04-11 DIAGNOSIS — Z6841 Body Mass Index (BMI) 40.0 and over, adult: Secondary | ICD-10-CM

## 2018-04-11 MED ORDER — PHENTERMINE HCL 37.5 MG PO TABS: 37.5000 mg | ORAL_TABLET | Freq: Every day | ORAL | 1 refills | Status: DC

## 2018-04-11 NOTE — Patient Instructions (Addendum)
F/U in 2 months, call if you need me before  Please get labs today  New is phentermine one daily  Please change  Food choice   We will send for overnight sleep study asap, since you need this to qualify for oxygen, walking you today did not.  I will write letter for housing after I get the overnight study  Speak with Dr Modesta Messing about poor sleep and depression

## 2018-04-11 NOTE — Progress Notes (Signed)
Shawn Ramsey     MRN: [id number]      DOB: [birth year]   HPI [name] is here for follow up and re-evaluation of chronic medical conditions, medication management and review of any available recent lab and radiology data.  Preventive health is updated, specifically  Cancer screening and Immunization.   Depressed due to lack of housing, needs a 2 bedroom apt because of his need for nocturnal oxygen , which needs  To be kept in another room , also needs someone to sleep in the home with him because of his severe sleep apnea Feels overwhelmed and stressed and somewhat depressed because of lack of housing Has gained a lot of weight and is requesting phentermine again to help with weight loss   ROS Denies recent fever or chills. Denies sinus pressure, nasal congestion, ear pain or sore throat. Denies chest congestion, productive cough or wheezing. Denies chest pains, palpitations and leg swelling Denies abdominal pain, nausea, vomiting,diarrhea or constipation.   Denies dysuria, frequency, hesitancy or incontinence. C/o limitation in mobility. Denies headaches, seizures, numbness, or tingling. c/o depression, anxiety or insomnia. Denies skin break down or rash.   PE  BP 128/62   Pulse 96   Resp 16   Ht 5\' 5"  (1.651 m)   Wt (!) 351 lb 1.3 oz (159.2 kg)   SpO2 95% Comment: room air  BMI 58.42 kg/m   Patient alert and oriented and in no cardiopulmonary distress.  HEENT: No facial asymmetry, EOMI,   oropharynx pink and moist.  Neck supple no JVD, no mass.  Chest: Clear to auscultation bilaterally.Decreased though adequate air entry bilaterally  CVS: S1, S2 no murmurs, no S3.Regular rate.  ABD: Soft non tender.   Ext: No edema  MS: Adequate though reduced ROM spine, shoulders, hips and knees.  Skin: Intact, no ulcerations or rash noted.  Psych: Good eye contact, normal affect. Memory intact not anxious or depressed appearing.  CNS: CN 2-12 intact, power,  normal  throughout.no focal deficits noted.   Assessment & Plan  OSA (obstructive sleep apnea) Requires bipap for adequate oxygenation during sleep, reports compliance and is followed by Pulmonary  Nocturnal hypoxia Decompensates during sleep despite bipap use , and requires supplemental oxygen , needs re testing by overnight to maintain his oxygen , and this will also assist in his housing search fr a 2 bedroom unit which he needs   Diabetes mellitus type 2 in obese [name] [name] is reminded of the importance of commitment to daily physical activity for 30 minutes or more, as able and the need to limit carbohydrate intake to 30 to 60 grams per meal to help with blood sugar control.   The need to take medication as prescribed, test blood sugar as directed, and to call between visits if there is a concern that blood sugar is uncontrolled is also discussed.   [name] is reminded of the importance of daily foot exam, annual eye examination, and good blood sugar, blood pressure and cholesterol control. Updated lab needed .   Diabetic Labs Latest Ref Rng & Units 01/10/2018 09/14/2017 01/26/2017 09/19/2016 09/10/2016  HbA1c <5.7 % of total Hgb - 6.4(H) 5.8(H) 6.8(H) -  Microalbumin Not Estab. ug/mL 27.5(H) - - 0.4 -  Micro/Creat Ratio 0.0 - 30.0 mg/g creat 7.4 - - 2 -  Chol <200 mg/dL - 135 161 - -  HDL >40 mg/dL - 33(L) 35(L) - -  Calc LDL mg/dL (calc) - 85 98 - -  Triglycerides <150 mg/dL - 84 184(H) - -  Creatinine 0.60 - 1.35 mg/dL - 0.75 0.80 0.74 0.67   BP/Weight 04/11/2018 02/06/2018 01/09/2018 11/15/2017 09/13/2017 03/18/2017 7/62/2633  Systolic BP 354 562 563 893 734 287 -  Diastolic BP 62 87 78 83 60 98 -  Wt. (Lbs) 351.08 - 355.12 542 353 - 329  BMI 58.42 - 59.1 90.19 58.74 - 54.75  Some encounter information is confidential and restricted. Go to Review Flowsheets activity to see all data.   Foot/eye exam completion dates 01/09/2018 09/19/2016  Foot Form Completion Done Done         Morbid obesity with BMI of 60.0-69.9, adult (HCC) Unchanged, pt to start phentermine daily once again  Patient re-educated about  the importance of commitment to a  minimum of 150 minutes of exercise per week as able.  The importance of healthy food choices with portion control discussed, as well as eating regularly and within a 12 hour window most days. The need to choose "clean , green" food 50 to 75% of the time is discussed, as well as to make water the primary drink and set a goal of 64 ounces water daily.  Encouraged to start a food diary,  and to consider  joining a support group. Sample diet sheets offered. Goals set by the patient for the next several months.   Weight /BMI 04/11/2018 01/09/2018 11/15/2017  WEIGHT 351 lb 1.3 oz 355 lb 1.9 oz 542 lb  HEIGHT 5\' 5"  5\' 5"  5\' 5"   BMI 58.42 kg/m2 59.1 kg/m2 90.19 kg/m2  Some encounter information is confidential and restricted. Go to Review Flowsheets activity to see all data.

## 2018-04-12 ENCOUNTER — Telehealth: Payer: Self-pay

## 2018-04-12 NOTE — Telephone Encounter (Signed)
Sleep study scheduled for 04/19/2018 at [facility]. Patients mother (DPR) notified of appt info and I let her know that they will mail an information packet out to him. She verbalized understanding

## 2018-04-12 NOTE — Telephone Encounter (Signed)
Noted, thanks!

## 2018-04-15 DIAGNOSIS — G4734 Idiopathic sleep related nonobstructive alveolar hypoventilation: Secondary | ICD-10-CM | POA: Insufficient documentation

## 2018-04-15 NOTE — Assessment & Plan Note (Signed)
Unchanged, pt to start phentermine daily once again  Patient re-educated about  the importance of commitment to a  minimum of 150 minutes of exercise per week as able.  The importance of healthy food choices with portion control discussed, as well as eating regularly and within a 12 hour window most days. The need to choose "clean , green" food 50 to 75% of the time is discussed, as well as to make water the primary drink and set a goal of 64 ounces water daily.  Encouraged to start a food diary,  and to consider  joining a support group. Sample diet sheets offered. Goals set by the patient for the next several months.   Weight /BMI 04/11/2018 01/09/2018 11/15/2017  WEIGHT 351 lb 1.3 oz 355 lb 1.9 oz 542 lb  HEIGHT 5\' 5"  5\' 5"  5\' 5"   BMI 58.42 kg/m2 59.1 kg/m2 90.19 kg/m2  Some encounter information is confidential and restricted. Go to Review Flowsheets activity to see all data.

## 2018-04-15 NOTE — Assessment & Plan Note (Signed)
Shawn Ramsey is reminded of the importance of commitment to daily physical activity for 30 minutes or more, as able and the need to limit carbohydrate intake to 30 to 60 grams per meal to help with blood sugar control.   The need to take medication as prescribed, test blood sugar as directed, and to call between visits if there is a concern that blood sugar is uncontrolled is also discussed.   Shawn Ramsey is reminded of the importance of daily foot exam, annual eye examination, and good blood sugar, blood pressure and cholesterol control. Updated lab needed .   Diabetic Labs Latest Ref Rng & Units 01/10/2018 09/14/2017 01/26/2017 09/19/2016 09/10/2016  HbA1c <5.7 % of total Hgb - 6.4(H) 5.8(H) 6.8(H) -  Microalbumin Not Estab. ug/mL 27.5(H) - - 0.4 -  Micro/Creat Ratio 0.0 - 30.0 mg/g creat 7.4 - - 2 -  Chol <200 mg/dL - 135 161 - -  HDL >40 mg/dL - 33(L) 35(L) - -  Calc LDL mg/dL (calc) - 85 98 - -  Triglycerides <150 mg/dL - 84 184(H) - -  Creatinine 0.60 - 1.35 mg/dL - 0.75 0.80 0.74 0.67   BP/Weight 04/11/2018 02/06/2018 01/09/2018 11/15/2017 09/13/2017 03/18/2017 1/83/4373  Systolic BP 578 978 478 412 820 813 -  Diastolic BP 62 87 78 83 60 98 -  Wt. (Lbs) 351.08 - 355.12 542 353 - 329  BMI 58.42 - 59.1 90.19 58.74 - 54.75  Some encounter information is confidential and restricted. Go to Review Flowsheets activity to see all data.   Foot/eye exam completion dates 01/09/2018 09/19/2016  Foot Form Completion Done Done

## 2018-04-15 NOTE — Assessment & Plan Note (Signed)
Decompensates during sleep despite bipap use , and requires supplemental oxygen , needs re testing by overnight to maintain his oxygen , and this will also assist in his housing search fr a 2 bedroom unit which he needs

## 2018-04-15 NOTE — Assessment & Plan Note (Signed)
Requires bipap for adequate oxygenation during sleep, reports compliance and is followed by Pulmonary

## 2018-04-19 ENCOUNTER — Ambulatory Visit: Payer: Medicaid Other | Attending: Family Medicine | Admitting: Neurology

## 2018-04-19 DIAGNOSIS — Z7984 Long term (current) use of oral hypoglycemic drugs: Secondary | ICD-10-CM | POA: Insufficient documentation

## 2018-04-19 DIAGNOSIS — G4733 Obstructive sleep apnea (adult) (pediatric): Secondary | ICD-10-CM | POA: Insufficient documentation

## 2018-04-19 DIAGNOSIS — Z79899 Other long term (current) drug therapy: Secondary | ICD-10-CM | POA: Diagnosis not present

## 2018-04-23 NOTE — Procedures (Signed)
[place] A. Merlene Laughter, MD     [email or web address]             NOCTURNAL POLYSOMNOGRAPHY   LOCATION: [facility]  Patient Name: Shawn Ramsey, Shawn Ramsey Date: 04/19/2018 Gender: Male D.O.B: Oct 06, 1980 Age (years): 37 Referring Provider: Weston Settle Height (inches): 65 Interpreting Physician: Phillips Odor MD, ABSM Weight (lbs): 306 RPSGT: Peak, Robert BMI: 51 MRN: [id number] Neck Size: 22.50 CLINICAL INFORMATION The patient is referred for a BiPAP titration to treat sleep apnea.     Date of NPSG, Split Night or HST:  SLEEP STUDY TECHNIQUE As per the AASM Manual for the Scoring of Sleep and Associated Events v2.3 (April 2016) with a hypopnea requiring 4% desaturations.  The channels recorded and monitored were frontal, central and occipital EEG, electrooculogram (EOG), submentalis EMG (chin), nasal and oral airflow, thoracic and abdominal wall motion, anterior tibialis EMG, snore microphone, electrocardiogram, and pulse oximetry. Bilevel positive airway pressure (BPAP) was initiated at the beginning of the study and titrated to treat sleep-disordered breathing.  MEDICATIONS Medications self-administered by patient taken the night of the study : N/A  Current Outpatient Medications:  .  ACCU-CHEK AVIVA PLUS test strip, USE 1 STRIP TO CHECK GLUCOSE ONCE DAILY, Disp: 100 each, Rfl: 11 .  ACCU-CHEK SOFTCLIX LANCETS lancets, USE 1  TO CHECK GLUCOSE ONCE DAILY, Disp: 100 each, Rfl: 11 .  acetaminophen (TYLENOL) 500 MG tablet, Take 1,000 mg by mouth every 6 (six) hours as needed for mild pain, moderate pain or headache., Disp: , Rfl:  .  albuterol (PROVENTIL HFA;VENTOLIN HFA) 108 (90 Base) MCG/ACT inhaler, Inhale 1-2 puffs into the lungs every 4 (four) hours as needed for wheezing or shortness of breath., Disp: 1 Inhaler, Rfl: 0 .  ARIPiprazole (ABILIFY) 2 MG tablet, Take 1 tablet (2 mg total) by mouth daily., Disp: 90 tablet, Rfl: 1 .  atorvastatin  (LIPITOR) 40 MG tablet, Take 1 tablet (40 mg total) by mouth daily., Disp: 30 tablet, Rfl: 0 .  benzonatate (TESSALON) 100 MG capsule, Take 1 capsule (100 mg total) by mouth 3 (three) times daily as needed for cough., Disp: 21 capsule, Rfl: 0 .  blood glucose meter kit and supplies KIT, Dispense based on patient and insurance preference. Once daily testing dx E11.9, Disp: 1 each, Rfl: 0 .  doxycycline (VIBRAMYCIN) 100 MG capsule, Take 1 capsule (100 mg total) by mouth 2 (two) times daily., Disp: 14 capsule, Rfl: 0 .  ergocalciferol (VITAMIN D2) 50000 units capsule, Take 1 capsule (50,000 Units total) by mouth once a week. One capsule once weekly, Disp: 12 capsule, Rfl: 1 .  ipratropium-albuterol (DUONEB) 0.5-2.5 (3) MG/3ML SOLN, Take 3 mLs by nebulization every 6 (six) hours as needed (for shortness of breath)., Disp: 360 mL, Rfl: 0 .  metFORMIN (GLUCOPHAGE XR) 500 MG 24 hr tablet, Take 1 tablet (500 mg total) by mouth daily with breakfast., Disp: 90 tablet, Rfl: 1 .  montelukast (SINGULAIR) 10 MG tablet, Take 1 tablet (10 mg total) by mouth at bedtime., Disp: 30 tablet, Rfl: 3 .  omeprazole (PRILOSEC) 20 MG capsule, Take 20 mg by mouth daily as needed (for acid reflux). , Disp: , Rfl:  .  phentermine (ADIPEX-P) 37.5 MG tablet, Take 1 tablet (37.5 mg total) by mouth daily before breakfast., Disp: 30 tablet, Rfl: 1 .  potassium chloride SA (K-DUR,KLOR-CON) 20 MEQ tablet, Take 1 tablet (20 mEq total) by mouth daily., Disp: 30 tablet, Rfl: 2 .  torsemide (DEMADEX) 20 MG tablet, Take  1 tablet (20 mg total) by mouth daily., Disp: 20 tablet, Rfl: 0  RESPIRATORY PARAMETERS Optimal IPAP Pressure (cm): 26 AHI at Optimal Pressure (/hr) 2.0 Optimal EPAP Pressure (cm): 20   Overall Minimal O2 (%): 76.0 Minimal O2 at Optimal Pressure (%): 88.0 SLEEP ARCHITECTURE Start Time: 10:31:06 PM Stop Time: 4:50:11 AM Total Time (min): 379.1 Total Sleep Time (min): 343.5 Sleep Latency (min): 4.9 Sleep Efficiency  (%): 90.6% REM Latency (min): 77.0 WASO (min): 30.7 Stage N1 (%): 4.7% Stage N2 (%): 58.4% Stage N3 (%): 0.1% Stage R (%): 36.8 Supine (%): 58.95 Arousal Index (/hr): 12.6     CARDIAC DATA The 2 lead EKG demonstrated sinus rhythm. The mean heart rate was 88.7 beats per minute. Other EKG findings include: None. LEG MOVEMENT DATA The total Periodic Limb Movements of Sleep (PLMS) were 0. The PLMS index was 0.0. A PLMS index of <15 is considered normal in adults.  IMPRESSIONS The optimal BIPAP selected for this patient are ( 26 / 20cm of water). Supplemental oxygen was not required.   Delano Metz, MD Diplomate, American Board of Sleep Medicine.  ELECTRONICALLY SIGNED ON:  04/23/2018, 9:29 AM [facility] PH: [phone]   FX: [phone] [place]

## 2018-04-29 ENCOUNTER — Other Ambulatory Visit: Payer: Self-pay

## 2018-04-29 ENCOUNTER — Emergency Department (HOSPITAL_COMMUNITY)
Admission: EM | Admit: 2018-04-29 | Discharge: 2018-04-29 | Disposition: A | Discharge status: Home / Self Care | Payer: Medicaid Other | Attending: Emergency Medicine | Admitting: Emergency Medicine

## 2018-04-29 ENCOUNTER — Encounter (HOSPITAL_COMMUNITY): Payer: Self-pay | Admitting: Emergency Medicine

## 2018-04-29 DIAGNOSIS — H11421 Conjunctival edema, right eye: Secondary | ICD-10-CM | POA: Diagnosis not present

## 2018-04-29 DIAGNOSIS — E119 Type 2 diabetes mellitus without complications: Secondary | ICD-10-CM | POA: Insufficient documentation

## 2018-04-29 DIAGNOSIS — Z7984 Long term (current) use of oral hypoglycemic drugs: Secondary | ICD-10-CM | POA: Diagnosis not present

## 2018-04-29 DIAGNOSIS — J45909 Unspecified asthma, uncomplicated: Secondary | ICD-10-CM | POA: Insufficient documentation

## 2018-04-29 DIAGNOSIS — Z79899 Other long term (current) drug therapy: Secondary | ICD-10-CM | POA: Insufficient documentation

## 2018-04-29 DIAGNOSIS — I10 Essential (primary) hypertension: Secondary | ICD-10-CM | POA: Insufficient documentation

## 2018-04-29 DIAGNOSIS — H02842 Edema of right lower eyelid: Secondary | ICD-10-CM | POA: Diagnosis present

## 2018-04-29 MED ORDER — ERYTHROMYCIN 5 MG/GM OP OINT
TOPICAL_OINTMENT | Freq: Once | OPHTHALMIC | Status: AC
  Administered 2018-04-29: 1 via OPHTHALMIC
  Filled 2018-04-29: qty 3.5

## 2018-04-29 MED ORDER — KETOROLAC TROMETHAMINE 0.5 % OP SOLN
1.0000 [drp] | Freq: Once | OPHTHALMIC | Status: AC
  Administered 2018-04-29: 1 [drp] via OPHTHALMIC
  Filled 2018-04-29: qty 5

## 2018-04-29 MED ORDER — TETRACAINE HCL 0.5 % OP SOLN
OPHTHALMIC | Status: AC
  Administered 2018-04-29: 10:00:00
  Filled 2018-04-29: qty 4

## 2018-04-29 MED ORDER — FLUORESCEIN SODIUM 1 MG OP STRP
ORAL_STRIP | OPHTHALMIC | Status: AC
  Administered 2018-04-29: 10:00:00
  Filled 2018-04-29: qty 1

## 2018-04-29 NOTE — ED Notes (Signed)
Right lower leg swollen. Noted to have yellow drainage. Pt rubbing eye frequently
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
## 2018-04-29 NOTE — Discharge Instructions (Addendum)
Apply the ketorolac eye drop in your right eye every 8 hours which should help reduce the swelling of your eye lid.  Avoid rubbing the eye.  Applying an ice pack will also help with swelling.  Apply the antibiotic ointment also 3 times daily, making sure to coat the swollen eyelid to protect it - apply the ointment after applying the drop, waiting 10 minutes between the medicines.  Wear the eye patch only to help you remember not to rub your eye!!

## 2018-04-29 NOTE — ED Triage Notes (Signed)
Pt states that he woke up this morning with his lower eye lid swollen.

## 2018-05-01 ENCOUNTER — Telehealth: Payer: Self-pay | Admitting: *Deleted

## 2018-05-01 NOTE — Telephone Encounter (Signed)
Dr Luan Pulling will determine if he needs BiPap, he sees him, and that is his expertise. If we ordered the sleep study  She ordered, pls request they be sent, I do not have any result, if another MD ordered , pls ask [name] to follow up with the ordering MD

## 2018-05-01 NOTE — Telephone Encounter (Signed)
Pts mother [name] (on [place]) came by the office wanting to know about the sleep study results. Said they had never heard anything from them. They were trying to figure out if he still needed the Bipap with oxygen since they had never heard results. He has this now through [facility]. He was also trying to get a two bedroom apartment because of all of his equipment.

## 2018-05-02 NOTE — Telephone Encounter (Signed)
I have reviewed material gathered, I see thatt Dr Merlene Laughter is the person who did the sleep study, so therefore will direct Bipap management or otherwise. Yes, I did refer him I have left a letter to be faxed to Dr Merlene Laughter requesting result, as no final result is documented, but MORE IMPORTANTLY, pt needs a follow up appt with Dr Merlene Laughter to review the result in person and to manage his sleep disorder, explain this to Mother , so she will make a f/u appt, help as needed/ if needed  ?? pls ask

## 2018-05-03 NOTE — Telephone Encounter (Signed)
Done

## 2018-05-15 ENCOUNTER — Other Ambulatory Visit: Payer: Self-pay

## 2018-05-15 ENCOUNTER — Emergency Department (HOSPITAL_COMMUNITY)
Admission: EM | Admit: 2018-05-15 | Discharge: 2018-05-15 | Disposition: A | Discharge status: Home / Self Care | Payer: Medicaid Other | Attending: Emergency Medicine | Admitting: Emergency Medicine

## 2018-05-15 ENCOUNTER — Encounter (HOSPITAL_COMMUNITY): Payer: Self-pay | Admitting: Emergency Medicine

## 2018-05-15 DIAGNOSIS — Z79899 Other long term (current) drug therapy: Secondary | ICD-10-CM | POA: Diagnosis not present

## 2018-05-15 DIAGNOSIS — E119 Type 2 diabetes mellitus without complications: Secondary | ICD-10-CM | POA: Diagnosis not present

## 2018-05-15 DIAGNOSIS — Z7984 Long term (current) use of oral hypoglycemic drugs: Secondary | ICD-10-CM | POA: Diagnosis not present

## 2018-05-15 DIAGNOSIS — R600 Localized edema: Secondary | ICD-10-CM

## 2018-05-15 DIAGNOSIS — I10 Essential (primary) hypertension: Secondary | ICD-10-CM | POA: Diagnosis not present

## 2018-05-15 DIAGNOSIS — M7989 Other specified soft tissue disorders: Secondary | ICD-10-CM | POA: Diagnosis not present

## 2018-05-15 DIAGNOSIS — J449 Chronic obstructive pulmonary disease, unspecified: Secondary | ICD-10-CM | POA: Insufficient documentation

## 2018-05-15 DIAGNOSIS — F17210 Nicotine dependence, cigarettes, uncomplicated: Secondary | ICD-10-CM | POA: Insufficient documentation

## 2018-05-15 DIAGNOSIS — R2242 Localized swelling, mass and lump, left lower limb: Secondary | ICD-10-CM | POA: Diagnosis present

## 2018-05-15 LAB — CBC WITH DIFFERENTIAL/PLATELET
Abs Immature Granulocytes: 0.01 10*3/uL (ref 0.00–0.07)
Basophils Absolute: 0 10*3/uL (ref 0.0–0.1)
Basophils Relative: 1 %
Eosinophils Absolute: 0.1 10*3/uL (ref 0.0–0.5)
Eosinophils Relative: 2 %
HCT: 42.3 % (ref 39.0–52.0)
Hemoglobin: 13.7 g/dL (ref 13.0–17.0)
Immature Granulocytes: 0 %
Lymphocytes Relative: 33 %
Lymphs Abs: 1.9 10*3/uL (ref 0.7–4.0)
MCH: 28.5 pg (ref 26.0–34.0)
MCHC: 32.4 g/dL (ref 30.0–36.0)
MCV: 88.1 fL (ref 80.0–100.0)
Monocytes Absolute: 0.5 10*3/uL (ref 0.1–1.0)
Monocytes Relative: 9 %
Neutro Abs: 3.2 10*3/uL (ref 1.7–7.7)
Neutrophils Relative %: 55 %
Platelets: 305 10*3/uL (ref 150–400)
RBC: 4.8 MIL/uL (ref 4.22–5.81)
RDW: 14.9 % (ref 11.5–15.5)
WBC: 5.8 10*3/uL (ref 4.0–10.5)
nRBC: 0 % (ref 0.0–0.2)

## 2018-05-15 LAB — BASIC METABOLIC PANEL
Anion gap: 12 (ref 5–15)
BUN: 13 mg/dL (ref 6–20)
CO2: 25 mmol/L (ref 22–32)
Calcium: 8.6 mg/dL — ABNORMAL LOW (ref 8.9–10.3)
Chloride: 101 mmol/L (ref 98–111)
Creatinine, Ser: 0.85 mg/dL (ref 0.61–1.24)
GFR calc Af Amer: >60 mL/min (ref >60)
GFR calc non Af Amer: >60 mL/min (ref >60)
Glucose, Bld: 119 mg/dL — ABNORMAL HIGH (ref 70–99)
Potassium: 3.4 mmol/L — ABNORMAL LOW (ref 3.5–5.1)
Sodium: 138 mmol/L (ref 135–145)
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
## 2018-05-15 NOTE — ED Triage Notes (Signed)
Patient was seen by his pulmonologist yesterday and was told his BP was high. He has a headache and says he feels dizzy at times.

## 2018-05-15 NOTE — Discharge Instructions (Addendum)
Return here tomorrow morning to be evaluated for blood clots in your legs.  In the meantime try elevating your legs above your heart as much as possible.  You will need to see your doctor in a week or so to have your blood pressure checked.

## 2018-05-16 ENCOUNTER — Ambulatory Visit (HOSPITAL_COMMUNITY): Admission: RE | Admit: 2018-05-16 | Payer: Medicaid Other | Source: Ambulatory Visit

## 2018-05-31 ENCOUNTER — Telehealth (INDEPENDENT_AMBULATORY_CARE_PROVIDER_SITE_OTHER): Payer: Medicaid Other | Admitting: Student

## 2018-05-31 ENCOUNTER — Encounter: Payer: Self-pay | Admitting: Student

## 2018-05-31 NOTE — Progress Notes (Signed)
Patient's Appointment was moved to 06/01/2018.

## 2018-06-01 ENCOUNTER — Encounter: Payer: Self-pay | Admitting: Student

## 2018-06-01 ENCOUNTER — Telehealth (INDEPENDENT_AMBULATORY_CARE_PROVIDER_SITE_OTHER): Payer: Medicaid Other | Admitting: Student

## 2018-06-01 NOTE — Progress Notes (Signed)
    Patient was initially scheduled for a Virtual Office Visit on 05/31/2018 but upon calling the patient's listed number, they were at the grocery store and unable to do a phone visit. Patient's mother requested it be moved to 3:00 PM today. Attempted to reach the patient multiple times at his scheduled appointment time and multiple times after with no answer. Will no-charge today's visit. Can reschedule if patient calls back.   Signed, Erma Heritage, PA-C 06/01/2018, 4:04 PM Pager: [phone]

## 2018-06-13 ENCOUNTER — Encounter: Payer: Self-pay | Admitting: Family Medicine

## 2018-06-13 ENCOUNTER — Ambulatory Visit (INDEPENDENT_AMBULATORY_CARE_PROVIDER_SITE_OTHER): Payer: Medicaid Other | Admitting: Family Medicine

## 2018-06-13 ENCOUNTER — Other Ambulatory Visit: Payer: Self-pay

## 2018-06-13 VITALS — Ht 65.0 in | Wt 346.0 lb

## 2018-06-13 DIAGNOSIS — R519 Headache, unspecified: Secondary | ICD-10-CM

## 2018-06-13 DIAGNOSIS — Z6841 Body Mass Index (BMI) 40.0 and over, adult: Secondary | ICD-10-CM | POA: Diagnosis not present

## 2018-06-13 DIAGNOSIS — R51 Headache: Secondary | ICD-10-CM

## 2018-06-13 MED ORDER — PHENTERMINE HCL 37.5 MG PO TABS: 37.5000 mg | ORAL_TABLET | Freq: Every day | ORAL | 0 refills | Status: DC

## 2018-06-13 MED ORDER — IBUPROFEN 800 MG PO TABS: 800.0000 mg | ORAL_TABLET | Freq: Every evening | ORAL | 1 refills | Status: DC | PRN

## 2018-06-13 MED ORDER — METFORMIN HCL ER 500 MG PO TB24: 500.0000 mg | ORAL_TABLET | Freq: Every day | ORAL | 1 refills | Status: DC

## 2018-06-13 MED ORDER — ATORVASTATIN CALCIUM 40 MG PO TABS: 40.0000 mg | ORAL_TABLET | Freq: Every day | ORAL | 0 refills | Status: DC

## 2018-06-13 MED ORDER — POTASSIUM CHLORIDE CRYS ER 20 MEQ PO TBCR: 20.0000 meq | EXTENDED_RELEASE_TABLET | Freq: Every day | ORAL | 0 refills | Status: DC

## 2018-06-13 NOTE — Patient Instructions (Addendum)
    Thank you for completing your visit via telephone today.  I appreciate the opportunity to provide you with the care for your health and wellness. Today we discussed: Medication refills and overall health, headaches  We will continue the phentermine for 1 month.  Follow-up with Dr. [name] pending refill for possible continuing secondary to not knowing if is really helping you or not.  Phentermine could also be causing you to have the headaches that you are experiencing.  Will provide a one-month refill of the ibuprofen.  Please continue to remember that ibuprofen use daily can cause GI upset and risk of bleeding.  Please only use as needed.  Rotation with Tylenol would be best.  If your headaches do not get better we can consider another type of medication.  [place] YOUR HANDS WELL AND FREQUENTLY. AVOID TOUCHING YOUR FACE, UNLESS YOUR HANDS ARE FRESHLY WASHED.  GET FRESH AIR DAILY. STAY HYDRATED WITH WATER.   It was a pleasure to see you and I look forward to continuing to work together on your health and well-being. Please do not hesitate to call the office if you need care or have questions about your care.  Have a wonderful day and week. With Gratitude, Cherly Beach, DNP, AGNP-BC

## 2018-06-13 NOTE — Progress Notes (Signed)
Virtual Visit via Telephone Note  I connected with Shawn Ramsey on 06/13/18 at  2:40 PM EDT by telephone and verified that I am speaking with the correct person using two identifiers.   I discussed the limitations, risks, security and privacy concerns of performing an evaluation and management service by telephone and the availability of in person appointments. I also discussed with the patient that there may be a patient responsible charge related to this service. The patient expressed understanding and agreed to proceed.  Location of Patient: Home Location of Provider: Telehealth Consent was obtain for visit to be over via telehealth.  History of Present Illness: Shawn Ramsey is a 38 year old male patient of Dr. Griffin Dakin.  Who reports today for follow-up for weight control.  He has been taking phentermine but unsure if it is actually doing him any good.  He wants to stay on it for 1 more month to see if it will help him.  But him and his mother both do not feel like he stopped eating much.  Overall he feels good today on the telephone visit.  He reports needing some refills on medications.  The only complaint that he does have today is headaches.  Orts he had some leftover ibuprofen that Dr. [name] provided with him a couple years ago.  He has been taking that to help with his occasional headaches.  He and his mother both deny sinus issues or involvement.  He reports that ibuprofen helps him at times.  He is not tried Tylenol though.  Denies currently any of the side effects that come with NSAID use.  Denies having any fevers, chills, cough, shortness of breath, chest pain, chest tightness, sinus or nasal congestion.  Reports that he is taking his medications as directed.  And without trouble.  Past Medical, Surgical, Social History, Allergies, and Medications have been Reviewed.  Review of Systems  Constitutional: Negative for activity change, appetite change and chills.  HENT: Negative.    Eyes: Negative.   Respiratory: Negative for cough, chest tightness, shortness of breath and wheezing.   Cardiovascular: Negative for chest pain, palpitations and leg swelling.  Gastrointestinal: Negative.   Endocrine: Negative.   Genitourinary: Negative.   Musculoskeletal: Negative.   Skin: Negative.   Allergic/Immunologic: Negative.   Neurological: Negative.   Hematological: Negative.   Psychiatric/Behavioral: Negative.   All other systems reviewed and are negative.  Observations/Objective: Good communication, mother on the phone with him   Assessment and Plan: 1. Morbid obesity with BMI of 60.0-69.9, adult ([place]) Warts he is unsure if this medication is actually working for him.  But wants to try it for 1 more month.  Wants to meet with Dr. [name] in 1 month to see if there is another medication that can help him if this 1 does not seem to help him stop eating.  He reports that he still feels hungry and does not think that it is helping him as much as it could.  His mother is in agreement with this.  Both would like to try 1 more month of the medication.  We will call this in at this time.  Follow-up with Dr. [name] to see if it is working and if discontinuation is needed.  Patient encouraged to just eat well on a diet.  And to work out.  Some medications that he is on call secondary weight gain.  Benefit of the medication is outweighing the risk of the weight gain at this time.  Always  will consider duction or weaning of medication in future.  Reviewed side effects, risks and benefits of medication.   Patient acknowledged agreement and understanding of the plan.   - phentermine (ADIPEX-P) 37.5 MG tablet; Take 1 tablet (37.5 mg total) by mouth daily before breakfast.  Dispense: 30 tablet; Refill: 0  2. Intractable headache, unspecified chronicity pattern, unspecified headache type Reports taking 800 mg of ibuprofen for headaches.  Negated him on the use of ibuprofen versus Tylenol.   Discussed side effects and risk with using NSAID medications.  Unsure whether not headaches are related to the use of phentermine.  Versus not eating Ramsey.  Versus dehydration.  Discussed all of these with the patient and his mother.  Follow-up in 1 month to see if headaches are improving.  And to see if he needs anything else if the medication is not helping.  Reviewed side effects, risks and benefits of medication.   Patient acknowledged agreement and understanding of the plan.  NSAID precautions were reviewed with the patient.  Patient should take medication with food, discontinue if develops GI side effects, not to take other OTC NSAIDs at the same time, and not to use longer than recommended.   - ibuprofen (ADVIL) 800 MG tablet; Take 1 tablet (800 mg total) by mouth at bedtime as needed for headache.  Dispense: 30 tablet; Refill: 1   Follow Up Instructions: AVS printed and mailed    I discussed the assessment and treatment plan with the patient. The patient was provided an opportunity to ask questions and all were answered. The patient agreed with the plan and demonstrated an understanding of the instructions.   The patient was advised to call back or seek an in-person evaluation if the symptoms worsen or if the condition fails to improve as anticipated.  I provided 10 minutes of non-face-to-face time during this encounter.   Perlie Mayo, NP

## 2018-07-05 ENCOUNTER — Encounter: Payer: Self-pay | Admitting: Family Medicine

## 2018-07-05 ENCOUNTER — Ambulatory Visit (INDEPENDENT_AMBULATORY_CARE_PROVIDER_SITE_OTHER): Payer: Medicaid Other | Admitting: Family Medicine

## 2018-07-05 ENCOUNTER — Other Ambulatory Visit: Payer: Self-pay

## 2018-07-05 VITALS — BP 128/62 | Ht 65.0 in | Wt 346.0 lb

## 2018-07-05 DIAGNOSIS — I10 Essential (primary) hypertension: Secondary | ICD-10-CM

## 2018-07-05 DIAGNOSIS — E559 Vitamin D deficiency, unspecified: Secondary | ICD-10-CM

## 2018-07-05 DIAGNOSIS — J019 Acute sinusitis, unspecified: Secondary | ICD-10-CM

## 2018-07-05 DIAGNOSIS — E1169 Type 2 diabetes mellitus with other specified complication: Secondary | ICD-10-CM | POA: Diagnosis not present

## 2018-07-05 DIAGNOSIS — Z6841 Body Mass Index (BMI) 40.0 and over, adult: Secondary | ICD-10-CM

## 2018-07-05 DIAGNOSIS — E669 Obesity, unspecified: Secondary | ICD-10-CM

## 2018-07-05 DIAGNOSIS — E785 Hyperlipidemia, unspecified: Secondary | ICD-10-CM

## 2018-07-05 MED ORDER — SULFAMETHOXAZOLE-TRIMETHOPRIM 800-160 MG PO TABS: 1.0000 | ORAL_TABLET | Freq: Two times a day (BID) | ORAL | 0 refills | Status: DC

## 2018-07-05 MED ORDER — PHENTERMINE HCL 37.5 MG PO TABS: 37.5000 mg | ORAL_TABLET | Freq: Every day | ORAL | 0 refills | Status: DC

## 2018-07-05 NOTE — Patient Instructions (Addendum)
F/U I 4 week in office visit with mD, call if you need me sooner  Please get fasting lipid, cmp and EGFR, hBA1C and TSH and vit D level in the next 1 week , order is [facility] to [facility]  Please speak with psychiatrist re depression, as this is afeecting how you eat  Medications are sent for sinus infection and weight loss  Think about what you will eat, plan ahead. Choose " clean, green, fresh or frozen" over canned, processed or packaged foods which are more sugary, salty and fatty. 70 to 75% of food eaten should be vegetables and fruit. Three meals at set times with snacks allowed between meals, but they must be fruit or vegetables. Aim to eat over a 12 hour period , example 7 am to 7 pm, and STOP after  your last meal of the day. Drink water,generally about 64 ounces per day, no other drink is as healthy. Fruit juice is best enjoyed in a healthy way, by EATING the fruit.   Social distancing. Frequent hand washing with soap and water Keeping your hands off of your face.Use a mask an maintain 6 ft distance These 3 practices will help to keep both you and your community healthy during this time. Please practice them faithfully!  Thanks for choosing [facility], we consider it a privelige to serve you.

## 2018-07-06 ENCOUNTER — Encounter: Payer: Self-pay | Admitting: Family Medicine

## 2018-07-06 NOTE — Assessment & Plan Note (Signed)
Mr. Sesay is reminded of the importance of commitment to daily physical activity for 30 minutes or more, as able and the need to limit carbohydrate intake to 30 to 60 grams per meal to help with blood sugar control.   The need to take medication as prescribed, test blood sugar as directed, and to call between visits if there is a concern that blood sugar is uncontrolled is also discussed.   Mr. Lagrow is reminded of the importance of daily foot exam, annual eye examination, and good blood sugar, blood pressure and cholesterol control.  Diabetic Labs Latest Ref Rng & Units 05/15/2018 01/10/2018 09/14/2017 01/26/2017 09/19/2016  HbA1c <5.7 % of total Hgb - - 6.4(H) 5.8(H) 6.8(H)  Microalbumin Not Estab. ug/mL - 27.5(H) - - 0.4  Micro/Creat Ratio 0.0 - 30.0 mg/g creat - 7.4 - - 2  Chol <200 mg/dL - - 135 161 -  HDL >40 mg/dL - - 33(L) 35(L) -  Calc LDL mg/dL (calc) - - 85 98 -  Triglycerides <150 mg/dL - - 84 184(H) -  Creatinine 0.61 - 1.24 mg/dL 0.85 - 0.75 0.80 0.74   BP/Weight 07/05/2018 06/13/2018 05/15/2018 04/29/2018 04/11/2018 02/06/2018 21/12/7354  Systolic BP 701 - 410 301 314 388 875  Diastolic BP 62 - 74 63 62 87 78  Wt. (Lbs) 346 346 353 350.53 351.08 - 355.12  BMI 57.58 57.58 58.74 58.33 58.42 - 59.1  Some encounter information is confidential and restricted. Go to Review Flowsheets activity to see all data.   Foot/eye exam completion dates 01/09/2018 09/19/2016  Foot Form Completion Done Done   Updated lab needed at/ before next visit.

## 2018-07-06 NOTE — Assessment & Plan Note (Signed)
Hyperlipidemia:Low fat diet discussed and encouraged.   Lipid Panel  Lab Results  Component Value Date   CHOL 135 09/14/2017   HDL 33 (L) 09/14/2017   LDLCALC 85 09/14/2017   TRIG 84 09/14/2017   CHOLHDL 4.1 09/14/2017  Updated lab needed at/ before next visit.

## 2018-07-06 NOTE — Progress Notes (Signed)
This visit type is conducted due to national recommendations for restrictions regarding the COVID -19 Pandemic. Due to the patient's age and / or co morbidities, this format is felt to be most appropriate at this time without adequate follow up. The patient has no access to video technology/ had technical difficulties with video, requiring transitioning to audio format  only ( telephone ). All issues noted this document were discussed and addressed,no physical exam can be performed in this format.  Virtual Visit via Telephone Note  I connected with Shawn Ramsey on 07/06/18 at  9:20 AM EDT by telephone and verified that I am speaking with the correct person using two identifiers.  Location: Patient: home Provider: office  Mother also involved in visit as patient has mild to moderate MR   I discussed the limitations, risks, security and privacy concerns of performing an evaluation and management service by telephone and the availability of in person appointments. I also discussed with the patient that there may be a patient responsible charge related to this service. The patient expressed understanding and agreed to proceed.   History of Present Illness: 1 week h/o sinus pressure with brown nasal drainage, no fever or chills. My medicine to lose weight is not working, I feel depressed often, but I do not intend to hurt myself or anyone else Denies recent fever or chills. Denies sinus pressure, nasal congestion, ear pain or sore throat. Denies chest congestion, productive cough or wheezing. Denies chest pains, palpitations and leg swelling Denies abdominal pain, nausea, vomiting,diarrhea or constipation.   Denies dysuria, frequency, hesitancy or incontinence. C/o chronic  joint pain, swelling and limitation in mobility. Denies headaches, seizures, numbness, or tingling. C/o  depression, anxiety denies  insomnia. Denies skin break down or rash.       Observations/Objective:  BP  128/62   Ht 5\' 5"  (1.651 m)   Wt (!) 346 lb (156.9 kg)   BMI 57.58 kg/m  Good communication with no confusion and intact memory. Alert and oriented x 3 No signs of respiratory distress during sppech   Assessment and Plan: Acute rhinosinusitis Antibiotic prescribed and saline nasal flush encouraged  Morbid obesity with BMI of 50.0-59.9, adult (HCC) 7 pound weiht loss since last OV , needs to lose more, resume phentermine daily, depression to be addressed by Psych  Patient re-educated about  the importance of commitment to a  minimum of 150 minutes of exercise per week as able.  The importance of healthy food choices with portion control discussed, as well as eating regularly and within a 12 hour window most days. The need to choose "clean , green" food 50 to 75% of the time is discussed, as well as to make water the primary drink and set a goal of 64 ounces water daily.  Encouraged to start a food diary,  and to consider  joining a support group. Sample diet sheets offered. Goals set by the patient for the next several months.   Weight /BMI 07/05/2018 06/13/2018 05/15/2018  WEIGHT 346 lb 346 lb 353 lb  HEIGHT 5\' 5"  5\' 5"  5\' 5"   BMI 57.58 kg/m2 57.58 kg/m2 58.74 kg/m2  Some encounter information is confidential and restricted. Go to Review Flowsheets activity to see all data.      Essential hypertension Controlled, no change in medication DASH diet and commitment to daily physical activity for a minimum of 30 minutes discussed and encouraged, as a part of hypertension management. The importance of attaining a healthy weight is  also discussed.  BP/Weight 07/05/2018 06/13/2018 05/15/2018 04/29/2018 04/11/2018 02/06/2018 22/29/7989  Systolic BP 211 - 941 740 814 481 856  Diastolic BP 62 - 74 63 62 87 78  Wt. (Lbs) 346 346 353 350.53 351.08 - 355.12  BMI 57.58 57.58 58.74 58.33 58.42 - 59.1  Some encounter information is confidential and restricted. Go to Review Flowsheets activity to  see all data.       Diabetes mellitus type 2 in obese [facility]) Shawn Ramsey is reminded of the importance of commitment to daily physical activity for 30 minutes or more, as able and the need to limit carbohydrate intake to 30 to 60 grams per meal to help with blood sugar control.   The need to take medication as prescribed, test blood sugar as directed, and to call between visits if there is a concern that blood sugar is uncontrolled is also discussed.   Shawn Ramsey is reminded of the importance of daily foot exam, annual eye examination, and good blood sugar, blood pressure and cholesterol control.  Diabetic Labs Latest Ref Rng & Units 05/15/2018 01/10/2018 09/14/2017 01/26/2017 09/19/2016  HbA1c <5.7 % of total Hgb - - 6.4(H) 5.8(H) 6.8(H)  Microalbumin Not Estab. ug/mL - 27.5(H) - - 0.4  Micro/Creat Ratio 0.0 - 30.0 mg/g creat - 7.4 - - 2  Chol <200 mg/dL - - 135 161 -  HDL >40 mg/dL - - 33(L) 35(L) -  Calc LDL mg/dL (calc) - - 85 98 -  Triglycerides <150 mg/dL - - 84 184(H) -  Creatinine 0.61 - 1.24 mg/dL 0.85 - 0.75 0.80 0.74   BP/Weight 07/05/2018 06/13/2018 05/15/2018 04/29/2018 04/11/2018 02/06/2018 31/49/7026  Systolic BP 378 - 588 502 774 128 786  Diastolic BP 62 - 74 63 62 87 78  Wt. (Lbs) 346 346 353 350.53 351.08 - 355.12  BMI 57.58 57.58 58.74 58.33 58.42 - 59.1  Some encounter information is confidential and restricted. Go to Review Flowsheets activity to see all data.   Foot/eye exam completion dates 01/09/2018 09/19/2016  Foot Form Completion Done Done   Updated lab needed at/ before next visit.      Dyslipidemia Hyperlipidemia:Low fat diet discussed and encouraged.   Lipid Panel  Lab Results  Component Value Date   CHOL 135 09/14/2017   HDL 33 (L) 09/14/2017   LDLCALC 85 09/14/2017   TRIG 84 09/14/2017   CHOLHDL 4.1 09/14/2017  Updated lab needed at/ before next visit.        Follow Up Instructions:    I discussed the assessment and treatment plan with  the patient. The patient was provided an opportunity to ask questions and all were answered. The patient agreed with the plan and demonstrated an understanding of the instructions.   The patient was advised to call back or seek an in-person evaluation if the symptoms worsen or if the condition fails to improve as anticipated.  I provided 22 minutes of non-face-to-face time during this encounter.   Tula Nakayama, MD

## 2018-07-06 NOTE — Assessment & Plan Note (Signed)
Antibiotic prescribed and saline nasal flush encouraged

## 2018-07-06 NOTE — Assessment & Plan Note (Signed)
Controlled, no change in medication DASH diet and commitment to daily physical activity for a minimum of 30 minutes discussed and encouraged, as a part of hypertension management. The importance of attaining a healthy weight is also discussed.  BP/Weight 07/05/2018 06/13/2018 05/15/2018 04/29/2018 04/11/2018 02/06/2018 61/47/0929  Systolic BP 574 - 734 037 096 438 381  Diastolic BP 62 - 74 63 62 87 78  Wt. (Lbs) 346 346 353 350.53 351.08 - 355.12  BMI 57.58 57.58 58.74 58.33 58.42 - 59.1  Some encounter information is confidential and restricted. Go to Review Flowsheets activity to see all data.

## 2018-07-06 NOTE — Assessment & Plan Note (Signed)
7 pound weiht loss since last OV , needs to lose more, resume phentermine daily, depression to be addressed by Psych  Patient re-educated about  the importance of commitment to a  minimum of 150 minutes of exercise per week as able.  The importance of healthy food choices with portion control discussed, as well as eating regularly and within a 12 hour window most days. The need to choose "clean , green" food 50 to 75% of the time is discussed, as well as to make water the primary drink and set a goal of 64 ounces water daily.  Encouraged to start a food diary,  and to consider  joining a support group. Sample diet sheets offered. Goals set by the patient for the next several months.   Weight /BMI 07/05/2018 06/13/2018 05/15/2018  WEIGHT 346 lb 346 lb 353 lb  HEIGHT 5\' 5"  5\' 5"  5\' 5"   BMI 57.58 kg/m2 57.58 kg/m2 58.74 kg/m2  Some encounter information is confidential and restricted. Go to Review Flowsheets activity to see all data.
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
## 2018-07-11 ENCOUNTER — Ambulatory Visit: Payer: Self-pay | Admitting: Family Medicine

## 2018-07-12 ENCOUNTER — Ambulatory Visit (INDEPENDENT_AMBULATORY_CARE_PROVIDER_SITE_OTHER): Payer: Medicaid Other | Admitting: Psychiatry

## 2018-07-12 ENCOUNTER — Encounter (HOSPITAL_COMMUNITY): Payer: Self-pay | Admitting: Psychiatry

## 2018-07-12 ENCOUNTER — Ambulatory Visit (HOSPITAL_COMMUNITY): Payer: Medicaid Other | Admitting: Psychiatry

## 2018-07-12 ENCOUNTER — Other Ambulatory Visit: Payer: Self-pay

## 2018-07-12 DIAGNOSIS — F79 Unspecified intellectual disabilities: Secondary | ICD-10-CM

## 2018-07-12 MED ORDER — ARIPIPRAZOLE 2 MG PO TABS: 2.0000 mg | ORAL_TABLET | Freq: Every day | ORAL | 0 refills | Status: DC

## 2018-08-06 ENCOUNTER — Ambulatory Visit: Payer: Medicaid Other | Admitting: Family Medicine

## 2018-08-22 ENCOUNTER — Other Ambulatory Visit: Payer: Self-pay

## 2018-08-22 ENCOUNTER — Ambulatory Visit (INDEPENDENT_AMBULATORY_CARE_PROVIDER_SITE_OTHER): Payer: Medicaid Other | Admitting: Family Medicine

## 2018-08-22 ENCOUNTER — Encounter: Payer: Self-pay | Admitting: Family Medicine

## 2018-08-22 VITALS — BP 128/62 | Ht 65.0 in | Wt 346.0 lb

## 2018-08-22 DIAGNOSIS — Z6841 Body Mass Index (BMI) 40.0 and over, adult: Secondary | ICD-10-CM

## 2018-08-22 DIAGNOSIS — I10 Essential (primary) hypertension: Secondary | ICD-10-CM

## 2018-08-22 DIAGNOSIS — G4733 Obstructive sleep apnea (adult) (pediatric): Secondary | ICD-10-CM

## 2018-08-22 DIAGNOSIS — E1169 Type 2 diabetes mellitus with other specified complication: Secondary | ICD-10-CM | POA: Diagnosis not present

## 2018-08-22 DIAGNOSIS — G4709 Other insomnia: Secondary | ICD-10-CM

## 2018-08-22 MED ORDER — PHENTERMINE HCL 37.5 MG PO TABS: 37.5000 mg | ORAL_TABLET | Freq: Every day | ORAL | 2 refills | Status: DC

## 2018-08-22 NOTE — Progress Notes (Signed)
Virtual Visit via Telephone Note  I connected with Shawn Ramsey on 08/22/18 at  3:00 PM EDT by telephone and verified that I am speaking with the correct person using two identifiers. This visit type is conducted due to national recommendations for restrictions regarding the COVID -19 Pandemic. Due to the patient's age and / or co morbidities, this format is felt to be most appropriate at this time without adequate follow up. The patient has no access to video technology/ had technical difficulties with video, requiring transitioning to audio format  only ( telephone ). All issues noted this document were discussed and addressed,no physical exam can be performed in this format.   Location: Patient: home Provider: office   I discussed the limitations, risks, security and privacy concerns of performing an evaluation and management service by telephone and the availability of in person appointments. I also discussed with the patient that there may be a patient responsible charge related to this service. The patient expressed understanding and agreed to proceed.   History of Present Illness:   f/u chronic problems Labs overdue C/o weight gain nad wants to resume phentermine to help with weight loss C/o fatigue and poor exercise tolerance Chonic left leg pain Denies recent fever or chills. Denies sinus pressure, nasal congestion, ear pain or sore throat. Chronic chest congestion, cough and  wheezing. Denies chest pains, palpitations and leg swelling Denies abdominal pain, nausea, vomiting,diarrhea or constipation.   Denies dysuria, frequency, hesitancy or incontinence. C/o joint pain, swelling and limitation in mobility. Denies headaches, seizures, numbness, or tingling. Denies uncontrolled depression, anxiety or insomnia. Denies skin break down or rash.     Observations/Objective:  BP 128/62   Ht 5\' 5"  (1.651 m)   Wt (!) 346 lb (156.9 kg)   BMI 57.58 kg/m  Good  communication with no confusion and intact memory. Alert and oriented x 3 No signs of respiratory distress during speech   Assessment and Plan: Diabetes mellitus type 2 in obese [facility]) Shawn Ramsey is reminded of the importance of commitment to daily physical activity for 30 minutes or more, as able and the need to limit carbohydrate intake to 30 to 60 grams per meal to help with blood sugar control.   The need to take medication as prescribed, test blood sugar as directed, and to call between visits if there is a concern that blood sugar is uncontrolled is also discussed.   Shawn Ramsey is reminded of the importance of daily foot exam, annual eye examination, and good blood sugar, blood pressure and cholesterol control.  Diabetic Labs Latest Ref Rng & Units 05/15/2018 01/10/2018 09/14/2017 01/26/2017 09/19/2016  HbA1c <5.7 % of total Hgb - - 6.4(H) 5.8(H) 6.8(H)  Microalbumin Not Estab. ug/mL - 27.5(H) - - 0.4  Micro/Creat Ratio 0.0 - 30.0 mg/g creat - 7.4 - - 2  Chol <200 mg/dL - - 135 161 -  HDL >40 mg/dL - - 33(L) 35(L) -  Calc LDL mg/dL (calc) - - 85 98 -  Triglycerides <150 mg/dL - - 84 184(H) -  Creatinine 0.61 - 1.24 mg/dL 0.85 - 0.75 0.80 0.74   BP/Weight 08/22/2018 07/05/2018 06/13/2018 05/15/2018 04/29/2018 04/11/2018 62/70/3500  Systolic BP 938 182 - 993 716 967 893  Diastolic BP 62 62 - 74 63 62 87  Wt. (Lbs) 346 346 346 353 350.53 351.08 -  BMI 57.58 57.58 57.58 58.74 58.33 58.42 -  Some encounter information is confidential and restricted. Go to Review Flowsheets activity to see  all data.   Foot/eye exam completion dates 01/09/2018 09/19/2016  Foot Form Completion Done Done      Updated lab needed at/ before next visit.   Morbid obesity with BMI of 50.0-59.9, adult [facility])  Patient re-educated about  the importance of commitment to a  minimum of 150 minutes of exercise per week as able.  The importance of healthy food choices with portion control discussed, as well as eating  regularly and within a 12 hour window most days. The need to choose "clean , green" food 50 to 75% of the time is discussed, as well as to make water the primary drink and set a goal of 64 ounces water daily.    Weight /BMI 08/22/2018 07/05/2018 06/13/2018  WEIGHT 346 lb 346 lb 346 lb  HEIGHT 5\' 5"  5\' 5"  5\' 5"   BMI 57.58 kg/m2 57.58 kg/m2 57.58 kg/m2  Some encounter information is confidential and restricted. Go to Review Flowsheets activity to see all data.      Essential hypertension Controlled, no change in medication` DASH diet and commitment to daily physical activity for a minimum of 30 minutes discussed and encouraged, as a part of hypertension management. The importance of attaining a healthy weight is also discussed.  BP/Weight 08/22/2018 07/05/2018 06/13/2018 05/15/2018 04/29/2018 04/11/2018 03/50/0938  Systolic BP 182 993 - 716 967 893 810  Diastolic BP 62 62 - 74 63 62 87  Wt. (Lbs) 346 346 346 353 350.53 351.08 -  BMI 57.58 57.58 57.58 58.74 58.33 58.42 -  Some encounter information is confidential and restricted. Go to Review Flowsheets activity to see all data.       OSA (obstructive sleep apnea) Severe pulmonary hypertension ,reminded of the need to use his bipap nightly    Follow Up Instructions:    I discussed the assessment and treatment plan with the patient. The patient was provided an opportunity to ask questions and all were answered. The patient agreed with the plan and demonstrated an understanding of the instructions.   The patient was advised to call back or seek an in-person evaluation if the symptoms worsen or if the condition fails to improve as anticipated.  I provided 22 minutes of non-face-to-face time during this encounter.   Tula Nakayama, MD

## 2018-08-22 NOTE — Patient Instructions (Addendum)
Annual physical exam with MD in September, call if you need me sooner  You NEED to get fasting labs ordered in May next week, please go to the lab they are already ordered  You report a 10 pound weight loss in past 4 months, which is good  Continue phentermine once daily  Think about what you will eat, plan ahead. Choose " clean, green, fresh or frozen" over canned, processed or packaged foods which are more sugary, salty and fatty. 70 to 75% of food eaten should be vegetables and fruit. Three meals at set times with snacks allowed between meals, but they must be fruit or vegetables. Aim to eat over a 12 hour period , example 7 am to 7 pm, and STOP after  your last meal of the day. Drink water,generally about 64 ounces per day, no other drink is as healthy. Fruit juice is best enjoyed in a healthy way, by EATING the fruit.  It is important that you exercise regularly at least 30 minutes 5 times a week. If you develop chest pain, have severe difficulty breathing, or feel very tired, stop exercising immediately and seek medical attention

## 2018-08-28 ENCOUNTER — Ambulatory Visit: Payer: Medicaid Other | Admitting: Family Medicine

## 2018-08-30 ENCOUNTER — Other Ambulatory Visit: Payer: Self-pay | Admitting: Family Medicine

## 2018-08-30 ENCOUNTER — Telehealth: Payer: Self-pay | Admitting: Family Medicine

## 2018-08-30 MED ORDER — PREDNISONE 10 MG PO TABS: 10.0000 mg | ORAL_TABLET | Freq: Every day | ORAL | 0 refills | Status: DC

## 2018-08-30 NOTE — Telephone Encounter (Signed)
Requests prednisone which has helped in the past, 7 day course prescribed, states he is wheezing

## 2018-09-02 ENCOUNTER — Encounter: Payer: Self-pay | Admitting: Family Medicine

## 2018-09-02 NOTE — Assessment & Plan Note (Signed)
  Patient re-educated about  the importance of commitment to a  minimum of 150 minutes of exercise per week as able.  The importance of healthy food choices with portion control discussed, as well as eating regularly and within a 12 hour window most days. The need to choose "clean , green" food 50 to 75% of the time is discussed, as well as to make water the primary drink and set a goal of 64 ounces water daily.    Weight /BMI 08/22/2018 07/05/2018 06/13/2018  WEIGHT 346 lb 346 lb 346 lb  HEIGHT 5\' 5"  5\' 5"  5\' 5"   BMI 57.58 kg/m2 57.58 kg/m2 57.58 kg/m2  Some encounter information is confidential and restricted. Go to Review Flowsheets activity to see all data.

## 2018-09-02 NOTE — Assessment & Plan Note (Signed)
Controlled, no change in medication` DASH diet and commitment to daily physical activity for a minimum of 30 minutes discussed and encouraged, as a part of hypertension management. The importance of attaining a healthy weight is also discussed.  BP/Weight 08/22/2018 07/05/2018 06/13/2018 05/15/2018 04/29/2018 04/11/2018 29/56/2130  Systolic BP 865 784 - 696 295 284 132  Diastolic BP 62 62 - 74 63 62 87  Wt. (Lbs) 346 346 346 353 350.53 351.08 -  BMI 57.58 57.58 57.58 58.74 58.33 58.42 -  Some encounter information is confidential and restricted. Go to Review Flowsheets activity to see all data.

## 2018-09-02 NOTE — Assessment & Plan Note (Signed)
Shawn Ramsey is reminded of the importance of commitment to daily physical activity for 30 minutes or more, as able and the need to limit carbohydrate intake to 30 to 60 grams per meal to help with blood sugar control.   The need to take medication as prescribed, test blood sugar as directed, and to call between visits if there is a concern that blood sugar is uncontrolled is also discussed.   Shawn Ramsey is reminded of the importance of daily foot exam, annual eye examination, and good blood sugar, blood pressure and cholesterol control.  Diabetic Labs Latest Ref Rng & Units 05/15/2018 01/10/2018 09/14/2017 01/26/2017 09/19/2016  HbA1c <5.7 % of total Hgb - - 6.4(H) 5.8(H) 6.8(H)  Microalbumin Not Estab. ug/mL - 27.5(H) - - 0.4  Micro/Creat Ratio 0.0 - 30.0 mg/g creat - 7.4 - - 2  Chol <200 mg/dL - - 135 161 -  HDL >40 mg/dL - - 33(L) 35(L) -  Calc LDL mg/dL (calc) - - 85 98 -  Triglycerides <150 mg/dL - - 84 184(H) -  Creatinine 0.61 - 1.24 mg/dL 0.85 - 0.75 0.80 0.74   BP/Weight 08/22/2018 07/05/2018 06/13/2018 05/15/2018 04/29/2018 04/11/2018 81/85/6314  Systolic BP 970 263 - 785 885 027 741  Diastolic BP 62 62 - 74 63 62 87  Wt. (Lbs) 346 346 346 353 350.53 351.08 -  BMI 57.58 57.58 57.58 58.74 58.33 58.42 -  Some encounter information is confidential and restricted. Go to Review Flowsheets activity to see all data.   Foot/eye exam completion dates 01/09/2018 09/19/2016  Foot Form Completion Done Done      Updated lab needed at/ before next visit.

## 2018-09-02 NOTE — Assessment & Plan Note (Signed)
Severe pulmonary hypertension ,reminded of the need to use his bipap nightly

## 2018-09-13 ENCOUNTER — Encounter (HOSPITAL_COMMUNITY): Payer: Self-pay | Admitting: Emergency Medicine

## 2018-09-13 ENCOUNTER — Emergency Department (HOSPITAL_COMMUNITY)
Admission: EM | Admit: 2018-09-13 | Discharge: 2018-09-13 | Disposition: A | Discharge status: Home / Self Care | Payer: Medicaid Other | Attending: Emergency Medicine | Admitting: Emergency Medicine

## 2018-09-13 ENCOUNTER — Other Ambulatory Visit: Payer: Self-pay

## 2018-09-13 ENCOUNTER — Emergency Department (HOSPITAL_COMMUNITY): Payer: Medicaid Other

## 2018-09-13 DIAGNOSIS — Z79899 Other long term (current) drug therapy: Secondary | ICD-10-CM | POA: Diagnosis not present

## 2018-09-13 DIAGNOSIS — G4733 Obstructive sleep apnea (adult) (pediatric): Secondary | ICD-10-CM

## 2018-09-13 DIAGNOSIS — R2242 Localized swelling, mass and lump, left lower limb: Secondary | ICD-10-CM | POA: Insufficient documentation

## 2018-09-13 DIAGNOSIS — F17210 Nicotine dependence, cigarettes, uncomplicated: Secondary | ICD-10-CM | POA: Diagnosis not present

## 2018-09-13 DIAGNOSIS — J449 Chronic obstructive pulmonary disease, unspecified: Secondary | ICD-10-CM | POA: Diagnosis not present

## 2018-09-13 DIAGNOSIS — I10 Essential (primary) hypertension: Secondary | ICD-10-CM | POA: Diagnosis not present

## 2018-09-13 DIAGNOSIS — R0602 Shortness of breath: Secondary | ICD-10-CM | POA: Diagnosis present

## 2018-09-13 DIAGNOSIS — E119 Type 2 diabetes mellitus without complications: Secondary | ICD-10-CM | POA: Insufficient documentation

## 2018-09-13 LAB — CBG MONITORING, ED: Glucose-Capillary: 116 mg/dL — ABNORMAL HIGH (ref 70–99)

## 2018-09-13 NOTE — ED Triage Notes (Signed)
Pt states he short of breath he normally uses oxygen at home for his cpap machine at night but doesn't have a oxygen tank states the home health took it. Pt also states his left lower leg is swollen more than normal.
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
## 2018-09-13 NOTE — Discharge Instructions (Addendum)
Please call both your primary doctor as well as your pulmonologist to discuss ongoing management of your obstructive sleep apnea.  If you develop chest pain, difficulty breathing or other new concerning symptom, please return to emergency department for reassessment.  Recommend continuing your CPAP.

## 2018-09-17 ENCOUNTER — Ambulatory Visit (INDEPENDENT_AMBULATORY_CARE_PROVIDER_SITE_OTHER): Payer: Medicaid Other | Admitting: Family Medicine

## 2018-09-17 ENCOUNTER — Other Ambulatory Visit: Payer: Self-pay

## 2018-09-17 VITALS — BP 136/84 | HR 100 | Resp 12 | Ht 64.0 in | Wt 358.0 lb

## 2018-09-17 DIAGNOSIS — E669 Obesity, unspecified: Secondary | ICD-10-CM

## 2018-09-17 DIAGNOSIS — I10 Essential (primary) hypertension: Secondary | ICD-10-CM | POA: Diagnosis not present

## 2018-09-17 DIAGNOSIS — R0600 Dyspnea, unspecified: Secondary | ICD-10-CM | POA: Diagnosis not present

## 2018-09-17 DIAGNOSIS — R0902 Hypoxemia: Secondary | ICD-10-CM

## 2018-09-17 DIAGNOSIS — R0689 Other abnormalities of breathing: Secondary | ICD-10-CM

## 2018-09-17 DIAGNOSIS — E785 Hyperlipidemia, unspecified: Secondary | ICD-10-CM

## 2018-09-17 DIAGNOSIS — Z6841 Body Mass Index (BMI) 40.0 and over, adult: Secondary | ICD-10-CM

## 2018-09-17 DIAGNOSIS — E1169 Type 2 diabetes mellitus with other specified complication: Secondary | ICD-10-CM

## 2018-09-17 NOTE — Progress Notes (Addendum)
Shawn Ramsey     MRN: [id number]      DOB: 14-May-1980   HPI Shawn Ramsey is here for follow up and re-evaluation folllowing recent ED visit follow-up seen on 09/13/2018 with dx of SOB. Noted to be increasingly short of breath with poor exercise tolerance and has difficulty finding a comfortable position of sleep Denies fever, chills, productive cough ROS Denies recent fever or chills.c/o chronic fatigue and excessive daytime sleepiness Denies sinus pressure, nasal congestion, ear pain or sore throat. chronic shortness of breath and  wheezing.   C/o orthopnea and pND and leg swelling , needs cardiology eval Denies dysuria, frequency, hesitancy or incontinence. C/o  joint pain, swelling and limitation in mobility. Denies headaches, seizures, numbness, or tingling. C/o  depression, anxiety and has hypersomnia. Denies skin break down or rash.   PE  BP (!) 146/80   Pulse 100   Resp 12   Ht 5\' 4"  (1.626 m)   Wt (!) 358 lb 0.6 oz (162.4 kg)   SpO2 93%   BMI 61.46 kg/m   Oxygen at rest is 93% Oxygen with exertion is 88 %  Oxygen    at rest after exertion is  93%  Patient drowsy and oriented and in  cardiopulmonary distress.with minimal activity. Hypersomnolent  HEENT: No facial asymmetry, EOMI,   oropharynx pink and moist.  Neck decreased ROM no JVD, no mass.  Chest: Clear to auscultation bilaterally.decreased air entry   CVS: S1, S2 no murmurs, no S3.Regular rate.  ABD: Soft non tender.  Ext: trace  Edema bilaterally  MS: Decreased  ROM spine, shoulders, hips and knees.  Skin: Intact, no ulcerations or rash noted.  Psych: Good eye contact, normal affect. not anxious or depressed appearing.  CNS: CN 2-12 intact, power,  normal throughout.no focal deficits noted.   Assessment & Plan  Hypoxia Meets requirement for supplemental oxygen at 1.5 liter/ min, will order, see detail in note demonstrating fall in oxygen with activity and recovery to above 90 % with rest after  exertion Oxygen at rest is 93% Oxygen with exertion is 88% Oxygen at rest after exertion is 93%  Dyspnea and respiratory abnormalities Progressive dyspnea and poor exercise tolerance, has established lung disease, cardiology to re eval for hear failure, c/o pND and orthopnea  Morbid obesity with BMI of 50.0-59.9, adult [facility])  Patient re-educated about  the importance of commitment to a  minimum of 150 minutes of exercise per week as able.  The importance of healthy food choices with portion control discussed, as well as eating regularly and within a 12 hour window most days. The need to choose "clean , green" food 50 to 75% of the time is discussed, as well as to make water the primary drink and set a goal of 64 ounces water daily.    Weight /BMI 09/17/2018 09/13/2018 08/22/2018  WEIGHT 358 lb 0.6 oz 345 lb 346 lb  HEIGHT 5\' 4"  5\' 5"  5\' 5"   BMI 61.46 kg/m2 57.41 kg/m2 57.58 kg/m2  Some encounter information is confidential and restricted. Go to Review Flowsheets activity to see all data.      Essential hypertension Controlled, no change in medication DASH diet and commitment to daily physical activity for a minimum of 30 minutes discussed and encouraged, as a part of hypertension management. The importance of attaining a healthy weight is also discussed.  BP/Weight 09/17/2018 09/13/2018 08/22/2018 07/05/2018 06/13/2018 9/32/3557 04/23/2023  Systolic BP 427 062 376 283 - 151 761  Diastolic  BP 84 70 62 62 - 74 63  Wt. (Lbs) 358.04 345 346 346 346 353 350.53  BMI 61.46 57.41 57.58 57.58 57.58 58.74 58.33  Some encounter information is confidential and restricted. Go to Review Flowsheets activity to see all data.       Diabetes mellitus type 2 in obese ([place]) Controlled with diet only, continue same  Dyslipidemia Hyperlipidemia:Low fat diet discussed and encouraged.   Lipid Panel  Lab Results  Component Value Date   CHOL 140 09/18/2018   HDL 38 (L) 09/18/2018   LDLCALC 83  09/18/2018   TRIG 92 09/18/2018   CHOLHDL 3.7 09/18/2018   Controlled, no change in medication Increase exercise as able

## 2018-09-17 NOTE — Patient Instructions (Addendum)
Follow-up in October as before call if you need me sooner.  On exam in the office your oxygen  is 88% with when walking so you do qualify for supplemental oxygen at 1.5 L/min when you are walking and we will follow through with getting this for you.  It is imperative that you get blood work this week you have not had necessary labs for the past 1 year.  This is important this is essential to ensure appropriate and good health care.  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  Keep follow-up with Dr. Luan Pulling as before.  Think about what you will eat, plan ahead. Choose " clean, green, fresh or frozen" over canned, processed or packaged foods which are more sugary, salty and fatty. 70 to 75% of food eaten should be vegetables and fruit. Three meals at set times with snacks allowed between meals, but they must be fruit or vegetables. Aim to eat over a 12 hour period , example 7 am to 7 pm, and STOP after  your last meal of the day. Drink water,generally about 64 ounces per day, no other drink is as healthy. Fruit juice is best enjoyed in a healthy way, by EATING the fruit.  Thanks for choosing [facility], we consider it a privelige to serve you.

## 2018-09-19 ENCOUNTER — Other Ambulatory Visit: Payer: Self-pay | Admitting: Family Medicine

## 2018-09-19 LAB — LIPID PANEL
Cholesterol: 140 mg/dL (ref <200)
HDL: 38 mg/dL — ABNORMAL LOW (ref >=40)
LDL Cholesterol (Calc): 83 mg/dL (calc)
Non-HDL Cholesterol (Calc): 102 mg/dL (calc) (ref <130)
Total CHOL/HDL Ratio: 3.7 (calc) (ref <5.0)
Triglycerides: 92 mg/dL (ref <150)

## 2018-09-19 LAB — TSH: TSH: 2.22 mIU/L (ref 0.40–4.50)

## 2018-09-19 LAB — COMPLETE METABOLIC PANEL WITH GFR
AG Ratio: 1.5 (calc) (ref 1.0–2.5)
ALT: 22 U/L (ref 9–46)
AST: 19 U/L (ref 10–40)
Albumin: 4.2 g/dL (ref 3.6–5.1)
Alkaline phosphatase (APISO): 58 U/L (ref 36–130)
BUN: 17 mg/dL (ref 7–25)
CO2: 28 mmol/L (ref 20–32)
Calcium: 9.1 mg/dL (ref 8.6–10.3)
Chloride: 103 mmol/L (ref 98–110)
Creat: 0.73 mg/dL (ref 0.60–1.35)
GFR, Est African American: 136 mL/min/{1.73_m2} (ref >=60)
GFR, Est Non African American: 118 mL/min/{1.73_m2} (ref >=60)
Globulin: 2.8 g/dL (calc) (ref 1.9–3.7)
Glucose, Bld: 99 mg/dL (ref 65–99)
Potassium: 4.1 mmol/L (ref 3.5–5.3)
Sodium: 141 mmol/L (ref 135–146)
Total Bilirubin: 0.3 mg/dL (ref 0.2–1.2)
Total Protein: 7 g/dL (ref 6.1–8.1)

## 2018-09-19 LAB — HEMOGLOBIN A1C
Hgb A1c MFr Bld: 6.5 % of total Hgb — ABNORMAL HIGH (ref <5.7)
Mean Plasma Glucose: 140 (calc)
eAG (mmol/L): 7.7 (calc)

## 2018-09-19 LAB — VITAMIN D 25 HYDROXY (VIT D DEFICIENCY, FRACTURES): Vit D, 25-Hydroxy: 18 ng/mL — ABNORMAL LOW (ref 30–100)

## 2018-09-19 MED ORDER — ERGOCALCIFEROL 1.25 MG (50000 UT) PO CAPS: 50000.0000 [IU] | ORAL_CAPSULE | ORAL | 3 refills | Status: DC

## 2018-09-19 NOTE — Progress Notes (Signed)
Pt aware.

## 2018-09-19 NOTE — Progress Notes (Signed)
Patient aware.

## 2018-09-24 ENCOUNTER — Encounter: Payer: Self-pay | Admitting: Family Medicine

## 2018-09-24 DIAGNOSIS — R0600 Dyspnea, unspecified: Secondary | ICD-10-CM | POA: Insufficient documentation

## 2018-09-24 DIAGNOSIS — R0902 Hypoxemia: Secondary | ICD-10-CM | POA: Insufficient documentation

## 2018-09-24 NOTE — Assessment & Plan Note (Signed)
Controlled with diet only, continue same

## 2018-09-24 NOTE — Assessment & Plan Note (Signed)
  Patient re-educated about  the importance of commitment to a  minimum of 150 minutes of exercise per week as able.  The importance of healthy food choices with portion control discussed, as well as eating regularly and within a 12 hour window most days. The need to choose "clean , green" food 50 to 75% of the time is discussed, as well as to make water the primary drink and set a goal of 64 ounces water daily.    Weight /BMI 09/17/2018 09/13/2018 08/22/2018  WEIGHT 358 lb 0.6 oz 345 lb 346 lb  HEIGHT 5\' 4"  5\' 5"  5\' 5"   BMI 61.46 kg/m2 57.41 kg/m2 57.58 kg/m2  Some encounter information is confidential and restricted. Go to Review Flowsheets activity to see all data.

## 2018-09-24 NOTE — Assessment & Plan Note (Signed)
Hyperlipidemia:Low fat diet discussed and encouraged.   Lipid Panel  Lab Results  Component Value Date   CHOL 140 09/18/2018   HDL 38 (L) 09/18/2018   LDLCALC 83 09/18/2018   TRIG 92 09/18/2018   CHOLHDL 3.7 09/18/2018   Controlled, no change in medication Increase exercise as able

## 2018-09-24 NOTE — Assessment & Plan Note (Signed)
Progressive dyspnea and poor exercise tolerance, has established lung disease, cardiology to re eval for hear failure, c/o pND and orthopnea

## 2018-09-24 NOTE — Assessment & Plan Note (Addendum)
Meets requirement for supplemental oxygen at 1.5 liter/ min, will order, see detail in note demonstrating fall in oxygen with activity and recovery to above 90 % with rest after exertion Oxygen at rest is 93% Oxygen with exertion is 88% Oxygen at rest after exertion is 93%

## 2018-09-24 NOTE — Assessment & Plan Note (Signed)
Controlled, no change in medication DASH diet and commitment to daily physical activity for a minimum of 30 minutes discussed and encouraged, as a part of hypertension management. The importance of attaining a healthy weight is also discussed.  BP/Weight 09/17/2018 09/13/2018 08/22/2018 07/05/2018 06/13/2018 0/88/1103 02/26/9456  Systolic BP 592 924 462 863 - 817 711  Diastolic BP 84 70 62 62 - 74 63  Wt. (Lbs) 358.04 345 346 346 346 353 350.53  BMI 61.46 57.41 57.58 57.58 57.58 58.74 58.33  Some encounter information is confidential and restricted. Go to Review Flowsheets activity to see all data.

## 2018-10-01 ENCOUNTER — Telehealth: Payer: Self-pay | Admitting: Family Medicine

## 2018-10-01 NOTE — Telephone Encounter (Signed)
Pt is calling in regarding Oxygen to [facility]

## 2018-10-02 ENCOUNTER — Other Ambulatory Visit: Payer: Self-pay | Admitting: Family Medicine

## 2018-10-02 ENCOUNTER — Telehealth: Payer: Self-pay

## 2018-10-02 ENCOUNTER — Other Ambulatory Visit: Payer: Self-pay

## 2018-10-02 MED ORDER — PENICILLIN V POTASSIUM 500 MG PO TABS: 500.0000 mg | ORAL_TABLET | Freq: Three times a day (TID) | ORAL | 0 refills | Status: DC

## 2018-10-02 MED ORDER — UNABLE TO FIND: 0 refills | Status: DC

## 2018-10-02 NOTE — Telephone Encounter (Signed)
Patient called back today, still unable to get oxygen until ov note is corrected. The note must list the o2 sats as                     1. O2% At rest                    2. O2% on exertion                    3. O2% at rest after exertion. They have to be written in this order for insurance purposes and order cannot be processed until they receive the corrected ov note.

## 2018-10-02 NOTE — Telephone Encounter (Signed)
rx for oxygen resent. Patient states he has been blowing yellow mucus out of his nose and coughing for about 2 weeks with some phlegm. No fever. Wants to know what you recommend. Please advise

## 2018-10-02 NOTE — Telephone Encounter (Signed)
pls let him know I apologize note is readt to be re esubmitted

## 2018-10-02 NOTE — Telephone Encounter (Signed)
Pt aware.

## 2018-10-02 NOTE — Telephone Encounter (Signed)
Recommend 7 day course of penicillin which I have prescribed , let him know please

## 2018-10-03 ENCOUNTER — Telehealth: Payer: Self-pay | Admitting: *Deleted

## 2018-10-03 NOTE — Telephone Encounter (Signed)
Shawn Ramsey with [facility] called she got prescription for portable oxygen they have to have home oxygen first as insurance will not cover the portable first. Also his stats need to be in a certain order they are all wrong percentage at rest oxygen while exercising and the rest after that. Pt needs a new test for oxygen.

## 2018-10-03 NOTE — Telephone Encounter (Signed)
Called patient to let him know about the notes being sent in. No answer and his vm is not set up. Will try again later.

## 2018-10-05 NOTE — Telephone Encounter (Signed)
Tried calling patient no answer, vm not set up

## 2018-10-05 NOTE — Telephone Encounter (Signed)
The OV was addended to say his several days ago, pls re send the addended OV

## 2018-10-05 NOTE — Telephone Encounter (Signed)
Patients oxygen stats have to be written as this  Oxygen at rest Oxygen with exertion Oxygen at rest after exertion  Note has to have all three things to be accepted

## 2018-10-05 NOTE — Telephone Encounter (Signed)
I have addended it again, I hope that this will do, sorry about that

## 2018-10-05 NOTE — Telephone Encounter (Signed)
The addended notes were faxed in and they sent this back.

## 2018-10-09 NOTE — Telephone Encounter (Signed)
Notes re-faxed to [facility]. Notes addened as requested.

## 2018-10-09 NOTE — Telephone Encounter (Signed)
Called to let patient know that office notes have been sent over and pharmacy should be contacting him soon regarding oxygen. No answer, no vm. 3rd attempt

## 2018-10-10 ENCOUNTER — Other Ambulatory Visit (HOSPITAL_BASED_OUTPATIENT_CLINIC_OR_DEPARTMENT_OTHER): Payer: Self-pay

## 2018-10-10 DIAGNOSIS — G4733 Obstructive sleep apnea (adult) (pediatric): Secondary | ICD-10-CM

## 2018-10-11 ENCOUNTER — Telehealth: Payer: Self-pay

## 2018-10-11 ENCOUNTER — Other Ambulatory Visit: Payer: Self-pay

## 2018-10-11 DIAGNOSIS — R0600 Dyspnea, unspecified: Secondary | ICD-10-CM

## 2018-10-11 DIAGNOSIS — J45991 Cough variant asthma: Secondary | ICD-10-CM

## 2018-10-11 DIAGNOSIS — G4733 Obstructive sleep apnea (adult) (pediatric): Secondary | ICD-10-CM

## 2018-10-11 DIAGNOSIS — R0902 Hypoxemia: Secondary | ICD-10-CM

## 2018-10-11 MED ORDER — UNABLE TO FIND: 0 refills | Status: DC

## 2018-10-11 NOTE — Telephone Encounter (Signed)
Error

## 2018-10-16 ENCOUNTER — Other Ambulatory Visit (HOSPITAL_COMMUNITY)
Admission: RE | Admit: 2018-10-16 | Discharge: 2018-10-16 | Disposition: A | Discharge status: Home / Self Care | Payer: Medicaid Other | Source: Ambulatory Visit | Attending: Neurology | Admitting: Neurology

## 2018-10-16 ENCOUNTER — Other Ambulatory Visit: Payer: Self-pay

## 2018-10-18 ENCOUNTER — Ambulatory Visit: Payer: Medicaid Other | Admitting: Family Medicine

## 2018-10-19 ENCOUNTER — Ambulatory Visit: Payer: Medicaid Other | Admitting: Student
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
## 2018-10-26 ENCOUNTER — Emergency Department (HOSPITAL_COMMUNITY)
Admission: EM | Admit: 2018-10-26 | Discharge: 2018-10-26 | Disposition: A | Discharge status: Home / Self Care | Payer: Medicaid Other | Attending: Emergency Medicine | Admitting: Emergency Medicine

## 2018-10-26 ENCOUNTER — Emergency Department (HOSPITAL_COMMUNITY): Payer: Medicaid Other

## 2018-10-26 ENCOUNTER — Encounter (HOSPITAL_COMMUNITY): Payer: Self-pay

## 2018-10-26 ENCOUNTER — Other Ambulatory Visit: Payer: Self-pay

## 2018-10-26 DIAGNOSIS — Z79899 Other long term (current) drug therapy: Secondary | ICD-10-CM | POA: Diagnosis not present

## 2018-10-26 DIAGNOSIS — Z7984 Long term (current) use of oral hypoglycemic drugs: Secondary | ICD-10-CM | POA: Diagnosis not present

## 2018-10-26 DIAGNOSIS — F17210 Nicotine dependence, cigarettes, uncomplicated: Secondary | ICD-10-CM | POA: Diagnosis not present

## 2018-10-26 DIAGNOSIS — R0600 Dyspnea, unspecified: Secondary | ICD-10-CM | POA: Insufficient documentation

## 2018-10-26 DIAGNOSIS — E119 Type 2 diabetes mellitus without complications: Secondary | ICD-10-CM | POA: Diagnosis not present

## 2018-10-26 DIAGNOSIS — J449 Chronic obstructive pulmonary disease, unspecified: Secondary | ICD-10-CM | POA: Insufficient documentation

## 2018-10-26 DIAGNOSIS — Z20828 Contact with and (suspected) exposure to other viral communicable diseases: Secondary | ICD-10-CM | POA: Insufficient documentation

## 2018-10-26 DIAGNOSIS — R0602 Shortness of breath: Secondary | ICD-10-CM | POA: Diagnosis present

## 2018-10-26 LAB — BLOOD GAS, VENOUS
Acid-Base Excess: 6.4 mmol/L — ABNORMAL HIGH (ref 0.0–2.0)
Bicarbonate: 27.9 mmol/L (ref 20.0–28.0)
FIO2: 21
O2 Saturation: 75.3 %
Patient temperature: 36.7
pCO2, Ven: 62.7 mmHg — ABNORMAL HIGH (ref 44.0–60.0)
pH, Ven: 7.33 (ref 7.250–7.430)
pO2, Ven: 45.1 mmHg — ABNORMAL HIGH (ref 32.0–45.0)

## 2018-10-26 LAB — CBC WITH DIFFERENTIAL/PLATELET
Abs Immature Granulocytes: 0.02 10*3/uL (ref 0.00–0.07)
Basophils Absolute: 0 10*3/uL (ref 0.0–0.1)
Basophils Relative: 0 %
Eosinophils Absolute: 0.1 10*3/uL (ref 0.0–0.5)
Eosinophils Relative: 2 %
HCT: 39.7 % (ref 39.0–52.0)
Hemoglobin: 12.4 g/dL — ABNORMAL LOW (ref 13.0–17.0)
Immature Granulocytes: 0 %
Lymphocytes Relative: 21 %
Lymphs Abs: 1.4 10*3/uL (ref 0.7–4.0)
MCH: 28.5 pg (ref 26.0–34.0)
MCHC: 31.2 g/dL (ref 30.0–36.0)
MCV: 91.3 fL (ref 80.0–100.0)
Monocytes Absolute: 0.5 10*3/uL (ref 0.1–1.0)
Monocytes Relative: 8 %
Neutro Abs: 4.5 10*3/uL (ref 1.7–7.7)
Neutrophils Relative %: 69 %
Platelets: 289 10*3/uL (ref 150–400)
RBC: 4.35 MIL/uL (ref 4.22–5.81)
RDW: 14.6 % (ref 11.5–15.5)
WBC: 6.5 10*3/uL (ref 4.0–10.5)
nRBC: 0 % (ref 0.0–0.2)

## 2018-10-26 LAB — COMPREHENSIVE METABOLIC PANEL
ALT: 26 U/L (ref 0–44)
AST: 24 U/L (ref 15–41)
Albumin: 4 g/dL (ref 3.5–5.0)
Alkaline Phosphatase: 71 U/L (ref 38–126)
Anion gap: 10 (ref 5–15)
BUN: 13 mg/dL (ref 6–20)
CO2: 29 mmol/L (ref 22–32)
Calcium: 8.2 mg/dL — ABNORMAL LOW (ref 8.9–10.3)
Chloride: 100 mmol/L (ref 98–111)
Creatinine, Ser: 0.75 mg/dL (ref 0.61–1.24)
GFR calc Af Amer: >60 mL/min (ref >60)
GFR calc non Af Amer: >60 mL/min (ref >60)
Glucose, Bld: 161 mg/dL — ABNORMAL HIGH (ref 70–99)
Potassium: 3.6 mmol/L (ref 3.5–5.1)
Sodium: 139 mmol/L (ref 135–145)
Total Bilirubin: 0.4 mg/dL (ref 0.3–1.2)
Total Protein: 7.5 g/dL (ref 6.5–8.1)

## 2018-10-26 LAB — SARS CORONAVIRUS 2 BY RT PCR (HOSPITAL ORDER, PERFORMED IN ~~LOC~~ HOSPITAL LAB): SARS Coronavirus 2: NEGATIVE

## 2018-10-26 LAB — BRAIN NATRIURETIC PEPTIDE: B Natriuretic Peptide: 18 pg/mL (ref 0.0–100.0)

## 2018-10-26 MED ORDER — METHYLPREDNISOLONE SODIUM SUCC 125 MG IJ SOLR
125.0000 mg | Freq: Once | INTRAMUSCULAR | Status: AC
  Administered 2018-10-26: 125 mg via INTRAVENOUS
  Filled 2018-10-26: qty 2

## 2018-10-26 MED ORDER — ALBUTEROL SULFATE (2.5 MG/3ML) 0.083% IN NEBU
2.5000 mg | INHALATION_SOLUTION | Freq: Once | RESPIRATORY_TRACT | Status: AC
  Administered 2018-10-26: 2.5 mg via RESPIRATORY_TRACT
  Filled 2018-10-26: qty 3

## 2018-10-26 MED ORDER — ALBUTEROL SULFATE HFA 108 (90 BASE) MCG/ACT IN AERS: 1.0000 | INHALATION_SPRAY | Freq: Four times a day (QID) | RESPIRATORY_TRACT | 0 refills | Status: DC | PRN

## 2018-10-26 MED ORDER — ALBUTEROL SULFATE HFA 108 (90 BASE) MCG/ACT IN AERS
4.0000 | INHALATION_SPRAY | Freq: Once | RESPIRATORY_TRACT | Status: AC
  Administered 2018-10-26: 14:00:00 4 via RESPIRATORY_TRACT
  Filled 2018-10-26: qty 6.7

## 2018-10-26 MED ORDER — FUROSEMIDE 10 MG/ML IJ SOLN
40.0000 mg | Freq: Once | INTRAMUSCULAR | Status: AC
  Administered 2018-10-26: 40 mg via INTRAVENOUS
  Filled 2018-10-26: qty 4

## 2018-10-26 MED ORDER — ALBUTEROL SULFATE (2.5 MG/3ML) 0.083% IN NEBU: 2.5000 mg | INHALATION_SOLUTION | Freq: Four times a day (QID) | RESPIRATORY_TRACT | 12 refills | Status: DC | PRN

## 2018-10-26 NOTE — ED Notes (Signed)
Patient ambulated in hallway after Covid was negative.  Patient's o2 sat while walking maintained at 96-99%.  Patient denies any SOB.  After taking patient back to room, o2 sat was 93% and patient stated he felt SOB after we walked.  Patients breathing increased to 26 bpm.

## 2018-10-26 NOTE — Discharge Instructions (Addendum)
Your COVID test today was negative.  Your chest x-ray did not show any signs of pneumonia.  Your blood work was reassuring.  As we discussed, your carbon dioxide was just slightly above normal.  This could be because of the fact that you do not have an oxygen machine.  As we discussed, is very important for your doctor to follow-up with getting an oxygen machine.  Take inhalers and nebulizer solution as directed.  Return the emergency department for any worsening difficulty breathing, chest pain, fevers or any other worsening or concerning symptoms.

## 2018-10-26 NOTE — ED Triage Notes (Signed)
Pt presents to ED with complaints of SOB since yesterday. Pt states he has also been sleeping a lot. Pt denies chest pain.
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
## 2018-11-05 ENCOUNTER — Encounter (HOSPITAL_COMMUNITY): Payer: Self-pay | Admitting: Psychiatry

## 2018-11-05 ENCOUNTER — Ambulatory Visit (INDEPENDENT_AMBULATORY_CARE_PROVIDER_SITE_OTHER): Payer: Medicaid Other | Admitting: Psychiatry

## 2018-11-05 ENCOUNTER — Other Ambulatory Visit: Payer: Self-pay

## 2018-11-05 DIAGNOSIS — F79 Unspecified intellectual disabilities: Secondary | ICD-10-CM

## 2018-11-05 NOTE — Patient Instructions (Signed)
1. Discontinue Abilify  2. Next appointment: 10/29 at 1:40 for 20 mins, phone 3. Please contact your PCP or Dr. Freddie Apley office for evaluation of excessive sleepiness

## 2018-11-07 ENCOUNTER — Other Ambulatory Visit: Payer: Self-pay

## 2018-11-07 ENCOUNTER — Ambulatory Visit (INDEPENDENT_AMBULATORY_CARE_PROVIDER_SITE_OTHER): Payer: Medicaid Other | Admitting: Family Medicine

## 2018-11-07 ENCOUNTER — Encounter: Payer: Self-pay | Admitting: Family Medicine

## 2018-11-07 VITALS — BP 140/80 | HR 101 | Temp 97.6°F | Resp 15 | Ht 65.0 in | Wt 359.1 lb

## 2018-11-07 DIAGNOSIS — Z23 Encounter for immunization: Secondary | ICD-10-CM

## 2018-11-07 DIAGNOSIS — Z6841 Body Mass Index (BMI) 40.0 and over, adult: Secondary | ICD-10-CM

## 2018-11-07 DIAGNOSIS — R0600 Dyspnea, unspecified: Secondary | ICD-10-CM | POA: Diagnosis not present

## 2018-11-07 DIAGNOSIS — R0902 Hypoxemia: Secondary | ICD-10-CM | POA: Diagnosis not present

## 2018-11-07 DIAGNOSIS — R0689 Other abnormalities of breathing: Secondary | ICD-10-CM

## 2018-11-07 NOTE — Patient Instructions (Signed)
    Thank you for coming into the office today. I appreciate the opportunity to provide you with the care for your health and wellness. Today we discussed: oxygen needs  Follow up: 11/21/2018  No labs or referrals today   Oxygen Ordered   Please continue to practice social distancing to keep you, your family, and our community safe.  If you must go out, please wear a Mask and practice good handwashing.  [place] YOUR HANDS WELL AND FREQUENTLY. AVOID TOUCHING YOUR FACE, UNLESS YOUR HANDS ARE FRESHLY WASHED.  GET FRESH AIR DAILY. STAY HYDRATED WITH WATER.   It was a pleasure to see you and I look forward to continuing to work together on your health and well-being. Please do not hesitate to call the office if you need care or have questions about your care.  Have a wonderful day and week. With Gratitude, Cherly Beach, DNP, AGNP-BC
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
## 2018-11-14 DIAGNOSIS — J9610 Chronic respiratory failure, unspecified whether with hypoxia or hypercapnia: Secondary | ICD-10-CM | POA: Insufficient documentation
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
## 2018-11-19 ENCOUNTER — Ambulatory Visit: Payer: Medicaid Other | Admitting: Physician Assistant

## 2018-11-19 ENCOUNTER — Telehealth: Payer: Self-pay | Admitting: *Deleted

## 2018-11-19 DIAGNOSIS — G4734 Idiopathic sleep related nonobstructive alveolar hypoventilation: Secondary | ICD-10-CM

## 2018-11-19 DIAGNOSIS — R0902 Hypoxemia: Secondary | ICD-10-CM

## 2018-11-19 NOTE — Telephone Encounter (Signed)
Shawn Ramsey Pt mother (on [place]) called wanting to know if Jarrett Soho had heard anything back from [facility] oxygen assessment

## 2018-11-19 NOTE — Telephone Encounter (Signed)
Spoke with [name]. Haven't heard anything. Re-faxed paperwork to [place]

## 2018-11-21 ENCOUNTER — Encounter: Payer: Medicaid Other | Admitting: Family Medicine

## 2018-11-26 ENCOUNTER — Telehealth: Payer: Self-pay | Admitting: *Deleted

## 2018-11-26 NOTE — Telephone Encounter (Signed)
Pt mother wanda on [employer] called said they have not heard anything from anyone about the oxygen that was supposed to be ordered. Would like a call back to know what is going on with this.

## 2018-11-26 NOTE — Telephone Encounter (Signed)
Returned patients call. No answer. No vm. Will try again later.

## 2018-11-27 NOTE — Telephone Encounter (Signed)
Spoke with [name] and let her know that we haven't heard anything back. She gave me another fax number which I faxed all paperwork to again.

## 2018-11-28 ENCOUNTER — Other Ambulatory Visit: Payer: Self-pay

## 2018-11-28 ENCOUNTER — Encounter (HOSPITAL_COMMUNITY): Payer: Self-pay | Admitting: Emergency Medicine

## 2018-11-28 ENCOUNTER — Emergency Department (HOSPITAL_COMMUNITY)
Admission: EM | Admit: 2018-11-28 | Discharge: 2018-11-29 | Disposition: A | Discharge status: Home / Self Care | Payer: Medicaid Other | Attending: Emergency Medicine | Admitting: Emergency Medicine

## 2018-11-28 ENCOUNTER — Emergency Department (HOSPITAL_COMMUNITY): Payer: Medicaid Other

## 2018-11-28 DIAGNOSIS — I10 Essential (primary) hypertension: Secondary | ICD-10-CM | POA: Insufficient documentation

## 2018-11-28 DIAGNOSIS — F17210 Nicotine dependence, cigarettes, uncomplicated: Secondary | ICD-10-CM | POA: Diagnosis not present

## 2018-11-28 DIAGNOSIS — R05 Cough: Secondary | ICD-10-CM | POA: Insufficient documentation

## 2018-11-28 DIAGNOSIS — Z79899 Other long term (current) drug therapy: Secondary | ICD-10-CM | POA: Insufficient documentation

## 2018-11-28 DIAGNOSIS — R059 Cough, unspecified: Secondary | ICD-10-CM

## 2018-11-28 DIAGNOSIS — Z7984 Long term (current) use of oral hypoglycemic drugs: Secondary | ICD-10-CM | POA: Diagnosis not present

## 2018-11-28 DIAGNOSIS — Z20828 Contact with and (suspected) exposure to other viral communicable diseases: Secondary | ICD-10-CM | POA: Insufficient documentation

## 2018-11-28 DIAGNOSIS — E119 Type 2 diabetes mellitus without complications: Secondary | ICD-10-CM | POA: Insufficient documentation

## 2018-11-28 DIAGNOSIS — J449 Chronic obstructive pulmonary disease, unspecified: Secondary | ICD-10-CM | POA: Insufficient documentation

## 2018-11-28 NOTE — ED Triage Notes (Signed)
Pt c/o cough and sob on exertion. Pt states he has been sleepwalking and falling.

## 2018-11-29 LAB — SARS CORONAVIRUS 2 (TAT 6-24 HRS): SARS Coronavirus 2: NEGATIVE

## 2018-11-29 MED ORDER — ALBUTEROL SULFATE HFA 108 (90 BASE) MCG/ACT IN AERS: 2.0000 | INHALATION_SPRAY | Freq: Once | RESPIRATORY_TRACT | Status: DC

## 2018-11-29 MED ORDER — ALBUTEROL SULFATE HFA 108 (90 BASE) MCG/ACT IN AERS
2.0000 | INHALATION_SPRAY | Freq: Once | RESPIRATORY_TRACT | Status: AC
  Administered 2018-11-29: 01:00:00 2 via RESPIRATORY_TRACT
  Filled 2018-11-29: qty 13.4

## 2018-11-29 MED ORDER — IPRATROPIUM-ALBUTEROL 0.5-2.5 (3) MG/3ML IN SOLN: 3.0000 mL | Freq: Once | RESPIRATORY_TRACT | Status: DC

## 2018-11-29 NOTE — Discharge Instructions (Addendum)
Return if any problems.  Use your cpap and 02 as directed.  Monitor your temperature.  See your Physician for recheck if symptoms persit
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
## 2018-12-11 ENCOUNTER — Emergency Department (HOSPITAL_COMMUNITY)
Admission: EM | Admit: 2018-12-11 | Discharge: 2018-12-11 | Disposition: A | Discharge status: Home / Self Care | Payer: Medicaid Other | Attending: Emergency Medicine | Admitting: Emergency Medicine

## 2018-12-11 ENCOUNTER — Encounter (HOSPITAL_COMMUNITY): Payer: Self-pay | Admitting: *Deleted

## 2018-12-11 ENCOUNTER — Other Ambulatory Visit: Payer: Self-pay

## 2018-12-11 DIAGNOSIS — R519 Headache, unspecified: Secondary | ICD-10-CM | POA: Diagnosis not present

## 2018-12-11 DIAGNOSIS — Z5321 Procedure and treatment not carried out due to patient leaving prior to being seen by health care provider: Secondary | ICD-10-CM | POA: Diagnosis not present

## 2018-12-11 NOTE — ED Triage Notes (Signed)
Patient presents to the ED after initially calling 911 and refusing transports.  Patient caregiver/mother reports she was cutting his hair when he fell asleep falling face first hitting forehead on table.  Patient mother reports LOC for about 2 minutes.  Patient is not on blood thinners.  No visible injury noted.
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
## 2018-12-20 ENCOUNTER — Ambulatory Visit (HOSPITAL_COMMUNITY): Payer: Medicaid Other | Admitting: Psychiatry
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
## 2018-12-27 ENCOUNTER — Encounter (HOSPITAL_COMMUNITY): Payer: Self-pay | Admitting: Psychiatry

## 2018-12-27 ENCOUNTER — Other Ambulatory Visit: Payer: Self-pay

## 2018-12-27 ENCOUNTER — Ambulatory Visit (INDEPENDENT_AMBULATORY_CARE_PROVIDER_SITE_OTHER): Payer: Medicaid Other | Admitting: Psychiatry

## 2018-12-27 DIAGNOSIS — F79 Unspecified intellectual disabilities: Secondary | ICD-10-CM

## 2018-12-27 NOTE — Patient Instructions (Signed)
1. Discontinue Abilify  2. Return to clinic as needed  3. Please discuss with your primary care whether you need to be followed by your neurologist for further evaluation of hypersomnia

## 2019-01-03 ENCOUNTER — Encounter: Payer: Self-pay | Admitting: Family Medicine

## 2019-01-03 ENCOUNTER — Ambulatory Visit (INDEPENDENT_AMBULATORY_CARE_PROVIDER_SITE_OTHER): Payer: Medicaid Other | Admitting: Family Medicine

## 2019-01-03 ENCOUNTER — Encounter: Payer: Self-pay | Admitting: *Deleted

## 2019-01-03 ENCOUNTER — Other Ambulatory Visit: Payer: Self-pay

## 2019-01-03 VITALS — BP 138/66 | HR 103 | Temp 98.6°F | Resp 18 | Ht 65.0 in | Wt 373.0 lb

## 2019-01-03 DIAGNOSIS — Z0000 Encounter for general adult medical examination without abnormal findings: Secondary | ICD-10-CM | POA: Diagnosis not present

## 2019-01-03 DIAGNOSIS — I10 Essential (primary) hypertension: Secondary | ICD-10-CM | POA: Diagnosis not present

## 2019-01-03 DIAGNOSIS — E1169 Type 2 diabetes mellitus with other specified complication: Secondary | ICD-10-CM | POA: Diagnosis not present

## 2019-01-03 DIAGNOSIS — E669 Obesity, unspecified: Secondary | ICD-10-CM | POA: Diagnosis not present

## 2019-01-03 MED ORDER — ORLISTAT 120 MG PO CAPS: 120.0000 mg | ORAL_CAPSULE | Freq: Three times a day (TID) | ORAL | 3 refills | Status: DC

## 2019-01-03 MED ORDER — POTASSIUM CHLORIDE CRYS ER 20 MEQ PO TBCR: 20.0000 meq | EXTENDED_RELEASE_TABLET | Freq: Every day | ORAL | 0 refills | Status: DC

## 2019-01-03 MED ORDER — TORSEMIDE 20 MG PO TABS: 20.0000 mg | ORAL_TABLET | Freq: Every day | ORAL | 0 refills | Status: DC

## 2019-01-03 MED ORDER — ERGOCALCIFEROL 1.25 MG (50000 UT) PO CAPS: 50000.0000 [IU] | ORAL_CAPSULE | ORAL | 1 refills | Status: DC

## 2019-01-03 NOTE — Progress Notes (Signed)
   Shawn Ramsey     MRN: [id number]      DOB: 1980-10-31   HPI: Patient is in for annual physical exam. C/o ongoing weight gain, and increased shortness of breath, wants help Recent labs, if available are reviewed. Immunization is reviewed , and  Is up to date    PE; BP 138/66   Pulse (!) 103   Temp 98.6 F (37 C) (Oral)   Resp 18   Ht 5\' 5"  (1.651 m)   Wt (!) 373 lb 0.6 oz (169.2 kg)   SpO2 92%   BMI 62.08 kg/m   Pleasant male, drowsy and oriented x 3, in mild  cardio-pulmonary distress. Afebrile. HEENT No facial trauma or asymetry. Sinuses non tender. EOMI External ears normal,  Neck: supple, no adenopathy,JVD or thyromegaly.No bruits.  Chest: Clear to ascultation bilaterally.No crackles or wheezes.Decreased air entry bilaterally Non tender to palpation  Cardiovascular system; Heart sounds normal,  S1 and  S2 ,no S3.  No murmur, or thrill. Apical beat not displaced Peripheral pulses normal.  Abdomen: Soft, morbidly obese No guarding, tenderness or rebound.    Musculoskeletal exam: Decreased  ROM of spine, hips , shoulders and knees.  deformity ,swelling or crepitus noted. No muscle wasting or atrophy.   Neurologic: Cranial nerves 2 to 12 intact. Power normal throughout. Disturbance in gait. No tremor.  Skin: Intact, no ulceration, erythema , scaling or rash noted. Pigmentation normal throughout  Psych; Normal mood and affect. Judgement and concentration normal   Assessment & Plan:  Annual physical exam Annual exam as documented. Counseling done  re healthy lifestyle involving commitment to 150 minutes exercise per week, heart healthy diet, and attaining healthy weight.The importance of adequate sleep also discussed.  Changes in health habits are decided on by the patient with goals and time frames  set for achieving them. Immunization and cancer screening needs are specifically addressed at this visit.

## 2019-01-03 NOTE — Patient Instructions (Addendum)
F/U in 3 months , pneumonia 23 at that  Visit, in office with MD, call if you need me before  HBA1C, chem 7 and EGFR today , CBC today   Mother will call weight loss clinic and arrange appointment  Letter stating you need someone with you 24/7 will be provided, stop by after lab today  New to help with managing diet is xenical  You are being referred to diabetic educator, important to go. Keep euye appt, vision is poor

## 2019-01-04 ENCOUNTER — Encounter: Payer: Self-pay | Admitting: Family Medicine

## 2019-01-04 LAB — BASIC METABOLIC PANEL WITH GFR
BUN: 16 mg/dL (ref 7–25)
CO2: 31 mmol/L (ref 20–32)
Calcium: 9.3 mg/dL (ref 8.6–10.3)
Chloride: 102 mmol/L (ref 98–110)
Creat: 0.73 mg/dL (ref 0.60–1.35)
GFR, Est African American: 136 mL/min/{1.73_m2} (ref >=60)
GFR, Est Non African American: 118 mL/min/{1.73_m2} (ref >=60)
Glucose, Bld: 111 mg/dL (ref 65–139)
Potassium: 4.2 mmol/L (ref 3.5–5.3)
Sodium: 141 mmol/L (ref 135–146)

## 2019-01-04 LAB — HEMOGLOBIN A1C
Hgb A1c MFr Bld: 6.7 % of total Hgb — ABNORMAL HIGH (ref <5.7)
Mean Plasma Glucose: 146 (calc)
eAG (mmol/L): 8.1 (calc)

## 2019-01-04 LAB — CBC
HCT: 38.4 % — ABNORMAL LOW (ref 38.5–50.0)
Hemoglobin: 12.4 g/dL — ABNORMAL LOW (ref 13.2–17.1)
MCH: 27.8 pg (ref 27.0–33.0)
MCHC: 32.3 g/dL (ref 32.0–36.0)
MCV: 86.1 fL (ref 80.0–100.0)
MPV: 10.3 fL (ref 7.5–12.5)
Platelets: 328 10*3/uL (ref 140–400)
RBC: 4.46 10*6/uL (ref 4.20–5.80)
RDW: 14.1 % (ref 11.0–15.0)
WBC: 7.5 10*3/uL (ref 3.8–10.8)

## 2019-01-06 ENCOUNTER — Encounter: Payer: Self-pay | Admitting: Family Medicine

## 2019-01-06 DIAGNOSIS — Z0000 Encounter for general adult medical examination without abnormal findings: Secondary | ICD-10-CM | POA: Insufficient documentation

## 2019-01-06 NOTE — Assessment & Plan Note (Signed)
Annual exam as documented. Counseling done  re healthy lifestyle involving commitment to 150 minutes exercise per week, heart healthy diet, and attaining healthy weight.The importance of adequate sleep also discussed. Changes in health habits are decided on by the patient with goals and time frames  set for achieving them. Immunization and cancer screening needs are specifically addressed at this visit. 

## 2019-01-07 ENCOUNTER — Telehealth: Payer: Self-pay

## 2019-01-07 DIAGNOSIS — E669 Obesity, unspecified: Secondary | ICD-10-CM

## 2019-01-07 DIAGNOSIS — E1169 Type 2 diabetes mellitus with other specified complication: Secondary | ICD-10-CM

## 2019-01-07 NOTE — Telephone Encounter (Signed)
Phentermine is NOT a good option for him, not sure if you can PA xenical, OTC this is called Deatra Canter, it is the same thing I recommend STRONGLY the weight loss clinic option and discussed at the visit, she will need to arrange appt

## 2019-01-07 NOTE — Telephone Encounter (Signed)
Mother called and stated the weight loss drug is not covered and is $500. Wants to know if you will call something else in. I don't think medicaid covers any weight loss agents so phentermine would be the cheapest option probably

## 2019-01-08 NOTE — Telephone Encounter (Signed)
Refer for diabetic ed and obesity please ( has been before)

## 2019-01-08 NOTE — Telephone Encounter (Signed)
Wants to know if they can go somewhere local. [facility] you mentioned the nutritionist. Want him to be referred there? Wants to try that first before the weight loss clinic

## 2019-01-08 NOTE — Telephone Encounter (Signed)
Referred to diabetic ed

## 2019-01-10 ENCOUNTER — Telehealth: Payer: Self-pay | Admitting: *Deleted

## 2019-01-10 NOTE — Telephone Encounter (Signed)
Pts mother called said pt is trying to get an appt and he needs note stating that he can climb 15 steps as the appt he is looking at has 15 steps.

## 2019-01-14 NOTE — Telephone Encounter (Signed)
Due to his medical conditions she is unable to provide that note

## 2019-01-14 NOTE — Telephone Encounter (Signed)
Based on his clinoidal condition I am unable to provide that note, he has severe sleep apnea and  And poor lung function

## 2019-01-14 NOTE — Telephone Encounter (Signed)
Mother said hes looking at an apartment and he needs a note stating he can climb 15 steps because the apartment he's looking at has 15 steps. Please advise

## 2019-01-15 NOTE — Telephone Encounter (Signed)
Pt notified that we could not provide letter

## 2019-01-22 ENCOUNTER — Encounter (HOSPITAL_COMMUNITY): Payer: Self-pay | Admitting: Emergency Medicine

## 2019-01-22 ENCOUNTER — Other Ambulatory Visit: Payer: Self-pay

## 2019-01-22 ENCOUNTER — Emergency Department (HOSPITAL_COMMUNITY)
Admission: EM | Admit: 2019-01-22 | Discharge: 2019-01-23 | Disposition: A | Discharge status: Home / Self Care | Payer: Medicaid Other | Attending: Emergency Medicine | Admitting: Emergency Medicine

## 2019-01-22 DIAGNOSIS — R0602 Shortness of breath: Secondary | ICD-10-CM | POA: Diagnosis present

## 2019-01-22 DIAGNOSIS — F17210 Nicotine dependence, cigarettes, uncomplicated: Secondary | ICD-10-CM | POA: Diagnosis not present

## 2019-01-22 DIAGNOSIS — Z79899 Other long term (current) drug therapy: Secondary | ICD-10-CM | POA: Insufficient documentation

## 2019-01-22 DIAGNOSIS — E119 Type 2 diabetes mellitus without complications: Secondary | ICD-10-CM | POA: Diagnosis not present

## 2019-01-22 DIAGNOSIS — J441 Chronic obstructive pulmonary disease with (acute) exacerbation: Secondary | ICD-10-CM | POA: Insufficient documentation

## 2019-01-22 DIAGNOSIS — I10 Essential (primary) hypertension: Secondary | ICD-10-CM | POA: Diagnosis not present

## 2019-01-22 DIAGNOSIS — Z20828 Contact with and (suspected) exposure to other viral communicable diseases: Secondary | ICD-10-CM | POA: Diagnosis not present

## 2019-01-22 NOTE — ED Triage Notes (Signed)
Pt reports SOB that began at 2315 tonight. Pt denies pain. Pt reports diaphoresis when the SOB started.

## 2019-01-23 ENCOUNTER — Emergency Department (HOSPITAL_COMMUNITY): Payer: Medicaid Other

## 2019-01-23 LAB — CBC WITH DIFFERENTIAL/PLATELET
Abs Immature Granulocytes: 0.04 10*3/uL (ref 0.00–0.07)
Basophils Absolute: 0 10*3/uL (ref 0.0–0.1)
Basophils Relative: 0 %
Eosinophils Absolute: 0.1 10*3/uL (ref 0.0–0.5)
Eosinophils Relative: 2 %
HCT: 39.4 % (ref 39.0–52.0)
Hemoglobin: 11.9 g/dL — ABNORMAL LOW (ref 13.0–17.0)
Immature Granulocytes: 1 %
Lymphocytes Relative: 20 %
Lymphs Abs: 1.5 10*3/uL (ref 0.7–4.0)
MCH: 27.7 pg (ref 26.0–34.0)
MCHC: 30.2 g/dL (ref 30.0–36.0)
MCV: 91.8 fL (ref 80.0–100.0)
Monocytes Absolute: 0.6 10*3/uL (ref 0.1–1.0)
Monocytes Relative: 8 %
Neutro Abs: 5.1 10*3/uL (ref 1.7–7.7)
Neutrophils Relative %: 69 %
Platelets: 308 10*3/uL (ref 150–400)
RBC: 4.29 MIL/uL (ref 4.22–5.81)
RDW: 14.7 % (ref 11.5–15.5)
WBC: 7.3 10*3/uL (ref 4.0–10.5)
nRBC: 0 % (ref 0.0–0.2)

## 2019-01-23 LAB — POC SARS CORONAVIRUS 2 AG -  ED
SARS Coronavirus 2 Ag: NEGATIVE
SARS Coronavirus 2 Ag: NEGATIVE

## 2019-01-23 LAB — FIBRINOGEN: Fibrinogen: 568 mg/dL — ABNORMAL HIGH (ref 210–475)

## 2019-01-23 LAB — BRAIN NATRIURETIC PEPTIDE: B Natriuretic Peptide: 21 pg/mL (ref 0.0–100.0)

## 2019-01-23 LAB — C-REACTIVE PROTEIN: CRP: 2 mg/dL — ABNORMAL HIGH (ref <1.0)

## 2019-01-23 LAB — COMPREHENSIVE METABOLIC PANEL
ALT: 20 U/L (ref 0–44)
AST: 15 U/L (ref 15–41)
Albumin: 4 g/dL (ref 3.5–5.0)
Alkaline Phosphatase: 66 U/L (ref 38–126)
Anion gap: 10 (ref 5–15)
BUN: 16 mg/dL (ref 6–20)
CO2: 27 mmol/L (ref 22–32)
Calcium: 8.6 mg/dL — ABNORMAL LOW (ref 8.9–10.3)
Chloride: 102 mmol/L (ref 98–111)
Creatinine, Ser: 0.79 mg/dL (ref 0.61–1.24)
GFR calc Af Amer: >60 mL/min (ref >60)
GFR calc non Af Amer: >60 mL/min (ref >60)
Glucose, Bld: 152 mg/dL — ABNORMAL HIGH (ref 70–99)
Potassium: 3.8 mmol/L (ref 3.5–5.1)
Sodium: 139 mmol/L (ref 135–145)
Total Bilirubin: 0.1 mg/dL — ABNORMAL LOW (ref 0.3–1.2)
Total Protein: 7.4 g/dL (ref 6.5–8.1)

## 2019-01-23 LAB — LACTATE DEHYDROGENASE: LDH: 122 U/L (ref 98–192)

## 2019-01-23 LAB — LACTIC ACID, PLASMA: Lactic Acid, Venous: 1.3 mmol/L (ref 0.5–1.9)

## 2019-01-23 LAB — TROPONIN I (HIGH SENSITIVITY): Troponin I (High Sensitivity): 5 ng/L (ref <18)

## 2019-01-23 LAB — FERRITIN: Ferritin: 114 ng/mL (ref 24–336)

## 2019-01-23 LAB — SARS CORONAVIRUS 2 (TAT 6-24 HRS): SARS Coronavirus 2: NEGATIVE

## 2019-01-23 LAB — D-DIMER, QUANTITATIVE: D-Dimer, Quant: <0.27 ug/mL-FEU (ref 0.00–0.50)

## 2019-01-23 LAB — TRIGLYCERIDES: Triglycerides: 123 mg/dL (ref <150)

## 2019-01-23 LAB — PROCALCITONIN: Procalcitonin: <0.1 ng/mL

## 2019-01-23 MED ORDER — ALBUTEROL (5 MG/ML) CONTINUOUS INHALATION SOLN
10.0000 mg/h | INHALATION_SOLUTION | RESPIRATORY_TRACT | Status: DC
  Administered 2019-01-23: 10 mg/h via RESPIRATORY_TRACT
  Filled 2019-01-23: qty 20

## 2019-01-23 MED ORDER — PREDNISONE 20 MG PO TABS
40.0000 mg | ORAL_TABLET | Freq: Once | ORAL | Status: AC
  Administered 2019-01-23: 40 mg via ORAL
  Filled 2019-01-23: qty 2

## 2019-01-23 MED ORDER — PREDNISONE 20 MG PO TABS: 40.0000 mg | ORAL_TABLET | Freq: Every day | ORAL | 0 refills | Status: DC

## 2019-01-23 NOTE — Discharge Instructions (Addendum)
Your x-ray does not show any signs of pneumonia and your blood work was reassuring.  It does not appear that you are having a heart attack.  Your shortness of breath is likely because of your COPD.  Because of this you will need to take the following medications  1.  Albuterol, take 2 puffs every 4 hours for the next 24 hours, then switch to 2 puffs every 4 hours as needed  2.  Prednisone, 40 mg daily for the next 5 days.  Because you are a diabetic this medication will make your blood sugar go up.  Please make sure that you are following a strict diabetic low carbohydrate diet.  Take your diabetic medications exactly as prescribed and make sure that you take your fluid pill in the morning.   You should return to the emergency department for severe or worsening difficulty breathing, follow-up with your doctor in 48 hours for recheck.  Your coronavirus test was negative
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
## 2019-01-24 ENCOUNTER — Other Ambulatory Visit: Payer: Self-pay

## 2019-01-24 MED ORDER — ATORVASTATIN CALCIUM 40 MG PO TABS: 40.0000 mg | ORAL_TABLET | Freq: Every day | ORAL | 0 refills | Status: DC

## 2019-01-28 LAB — CULTURE, BLOOD (ROUTINE X 2)
Culture: NO GROWTH
Culture: NO GROWTH
Special Requests: ADEQUATE

## 2019-01-29 ENCOUNTER — Telehealth (INDEPENDENT_AMBULATORY_CARE_PROVIDER_SITE_OTHER): Payer: Medicaid Other | Admitting: Student

## 2019-01-29 ENCOUNTER — Encounter: Payer: Self-pay | Admitting: Student

## 2019-01-29 VITALS — Ht 65.0 in | Wt 365.0 lb

## 2019-01-29 DIAGNOSIS — E785 Hyperlipidemia, unspecified: Secondary | ICD-10-CM

## 2019-01-29 DIAGNOSIS — I5032 Chronic diastolic (congestive) heart failure: Secondary | ICD-10-CM | POA: Diagnosis not present

## 2019-01-29 DIAGNOSIS — G4733 Obstructive sleep apnea (adult) (pediatric): Secondary | ICD-10-CM

## 2019-01-29 DIAGNOSIS — E662 Morbid (severe) obesity with alveolar hypoventilation: Secondary | ICD-10-CM

## 2019-01-29 DIAGNOSIS — Z79899 Other long term (current) drug therapy: Secondary | ICD-10-CM

## 2019-01-29 MED ORDER — TORSEMIDE 20 MG PO TABS: ORAL_TABLET | ORAL | 3 refills | Status: DC

## 2019-01-29 MED ORDER — POTASSIUM CHLORIDE CRYS ER 20 MEQ PO TBCR: 20.0000 meq | EXTENDED_RELEASE_TABLET | Freq: Two times a day (BID) | ORAL | 3 refills | Status: DC

## 2019-01-29 NOTE — Patient Instructions (Signed)
Medication Instructions:  Your physician has recommended you make the following change in your medication:  Increase Torsemide to 40 mg in the AM and 20 mg in the PM Daily  Increase Potassium to 20 mEq Two Times Daily   *If you need a refill on your cardiac medications before your next appointment, please call your pharmacy*  Lab Work: Your physician recommends that you return for lab work in: 2 Weeks ( 02/12/19)   If you have labs (blood work) drawn today and your tests are completely normal, you will receive your results only by: [name] MyChart Message (if you have MyChart) OR . A paper copy in the mail If you have any lab test that is abnormal or we need to change your treatment, we will call you to review the results.  Testing/Procedures: NONE  Follow-Up: At [facility], you and your health needs are our priority.  As part of our continuing mission to provide you with exceptional heart care, we have created designated Provider Care Teams.  These Care Teams include your primary Cardiologist (physician) and Advanced Practice Providers (APPs -  Physician Assistants and Nurse Practitioners) who all work together to provide you with the care you need, when you need it.  Your next appointment:   6-8 week(s)  The format for your next appointment:   Virtual Visit   Provider:   You may see Rozann Lesches, MD or one of the following Advanced Practice Providers on your designated Care Team:    Bernerd Pho, PA-C   Ermalinda Barrios, PA-C    Other Instructions Thank you for choosing [facility]!
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
## 2019-02-19 ENCOUNTER — Telehealth (HOSPITAL_COMMUNITY): Payer: Self-pay

## 2019-02-19 NOTE — Telephone Encounter (Signed)
Appointment and Advice - Telephone call with pt's Mother who called to inform Dr. Modesta Messing she had found a form with pt's current diagnoses on them and wanted to let Dr. Modesta Messing know.  Agreed to take these down and to pass them on to Dr. Modesta Messing upon her return from leave.  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  Collateral reported patient was having up and down days but doing okay.  States she still wants pt to see Dr. Modesta Messing upon her return.  States this does not need to be prior to her return from leave and that pt is already scheduled for 04/15/19.  States she will call back to request and earlier appointment if patient has any problems prior to then.

## 2019-02-26 ENCOUNTER — Other Ambulatory Visit: Payer: Self-pay

## 2019-02-26 ENCOUNTER — Emergency Department (HOSPITAL_COMMUNITY): Payer: Medicaid Other

## 2019-02-26 ENCOUNTER — Emergency Department (HOSPITAL_COMMUNITY)
Admission: EM | Admit: 2019-02-26 | Discharge: 2019-02-26 | Disposition: A | Discharge status: Home / Self Care | Payer: Medicaid Other | Attending: Emergency Medicine | Admitting: Emergency Medicine

## 2019-02-26 ENCOUNTER — Ambulatory Visit (INDEPENDENT_AMBULATORY_CARE_PROVIDER_SITE_OTHER): Payer: Medicaid Other | Admitting: Family Medicine

## 2019-02-26 ENCOUNTER — Encounter (HOSPITAL_COMMUNITY): Payer: Self-pay | Admitting: Emergency Medicine

## 2019-02-26 DIAGNOSIS — Z79899 Other long term (current) drug therapy: Secondary | ICD-10-CM | POA: Diagnosis not present

## 2019-02-26 DIAGNOSIS — E119 Type 2 diabetes mellitus without complications: Secondary | ICD-10-CM | POA: Diagnosis not present

## 2019-02-26 DIAGNOSIS — Z7984 Long term (current) use of oral hypoglycemic drugs: Secondary | ICD-10-CM | POA: Insufficient documentation

## 2019-02-26 DIAGNOSIS — F17210 Nicotine dependence, cigarettes, uncomplicated: Secondary | ICD-10-CM | POA: Insufficient documentation

## 2019-02-26 DIAGNOSIS — R0602 Shortness of breath: Secondary | ICD-10-CM | POA: Diagnosis present

## 2019-02-26 DIAGNOSIS — J449 Chronic obstructive pulmonary disease, unspecified: Secondary | ICD-10-CM | POA: Diagnosis not present

## 2019-02-26 DIAGNOSIS — U071 COVID-19: Secondary | ICD-10-CM | POA: Diagnosis not present

## 2019-02-26 DIAGNOSIS — R0689 Other abnormalities of breathing: Secondary | ICD-10-CM

## 2019-02-26 LAB — BASIC METABOLIC PANEL
Anion gap: 10 (ref 5–15)
BUN: 11 mg/dL (ref 6–20)
CO2: 30 mmol/L (ref 22–32)
Calcium: 8.6 mg/dL — ABNORMAL LOW (ref 8.9–10.3)
Chloride: 98 mmol/L (ref 98–111)
Creatinine, Ser: 0.76 mg/dL (ref 0.61–1.24)
GFR calc Af Amer: >60 mL/min (ref >60)
GFR calc non Af Amer: >60 mL/min (ref >60)
Glucose, Bld: 111 mg/dL — ABNORMAL HIGH (ref 70–99)
Potassium: 3.4 mmol/L — ABNORMAL LOW (ref 3.5–5.1)
Sodium: 138 mmol/L (ref 135–145)

## 2019-02-26 LAB — CBC WITH DIFFERENTIAL/PLATELET
Abs Immature Granulocytes: 0.01 10*3/uL (ref 0.00–0.07)
Basophils Absolute: 0 10*3/uL (ref 0.0–0.1)
Basophils Relative: 0 %
Eosinophils Absolute: 0 10*3/uL (ref 0.0–0.5)
Eosinophils Relative: 1 %
HCT: 41.1 % (ref 39.0–52.0)
Hemoglobin: 12.6 g/dL — ABNORMAL LOW (ref 13.0–17.0)
Immature Granulocytes: 0 %
Lymphocytes Relative: 15 %
Lymphs Abs: 1 10*3/uL (ref 0.7–4.0)
MCH: 27.7 pg (ref 26.0–34.0)
MCHC: 30.7 g/dL (ref 30.0–36.0)
MCV: 90.3 fL (ref 80.0–100.0)
Monocytes Absolute: 0.8 10*3/uL (ref 0.1–1.0)
Monocytes Relative: 13 %
Neutro Abs: 4.5 10*3/uL (ref 1.7–7.7)
Neutrophils Relative %: 71 %
Platelets: 269 10*3/uL (ref 150–400)
RBC: 4.55 MIL/uL (ref 4.22–5.81)
RDW: 14.9 % (ref 11.5–15.5)
WBC: 6.3 10*3/uL (ref 4.0–10.5)
nRBC: 0 % (ref 0.0–0.2)

## 2019-02-26 LAB — POC SARS CORONAVIRUS 2 AG -  ED: SARS Coronavirus 2 Ag: POSITIVE — AB

## 2019-02-26 LAB — D-DIMER, QUANTITATIVE: D-Dimer, Quant: 0.5 ug/mL-FEU (ref 0.00–0.50)

## 2019-02-26 MED ORDER — ACETAMINOPHEN 500 MG PO TABS: 500.0000 mg | ORAL_TABLET | ORAL | 0 refills | Status: DC | PRN

## 2019-02-26 MED ORDER — BENZONATATE 200 MG PO CAPS: 200.0000 mg | ORAL_CAPSULE | Freq: Three times a day (TID) | ORAL | 0 refills | Status: DC | PRN

## 2019-02-26 NOTE — ED Notes (Signed)
CRITICAL VALUE ALERT  Critical Value:  Covid POC positive  Date & Time Notied:  02/26/19 1710  Provider Notified: Kem Parkinson, PA  Orders Received/Actions taken: EDP notified

## 2019-02-26 NOTE — Discharge Instructions (Addendum)
Your test results today show that you have tested positive for COVID-19.  It is important that you isolate yourself at home as much as possible and wear a mask even inside the home.  Family members will also need to wear a mask and avoid contact as much as possible.  Continue your albuterol nebulizer treatments every 4-6 hours as needed.  Tylenol every 4 hours if needed for fever or pain.  You may want to invest in a portable pulse oximetry to measure your oxygen level daily.  Follow-up with your primary doctor.  Return to the ER if you develop increasing shortness of breath.

## 2019-02-26 NOTE — ED Triage Notes (Signed)
Patient complaining of cough and shortness of breath with exertion x 1 week. States yesterday he lost his taste and smell.
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
## 2019-02-26 NOTE — ED Notes (Signed)
Pt ambulated with no difficulty or distress noted. O2 sats remained above 95%.

## 2019-02-27 ENCOUNTER — Ambulatory Visit (INDEPENDENT_AMBULATORY_CARE_PROVIDER_SITE_OTHER): Payer: Medicaid Other | Admitting: Family Medicine

## 2019-02-27 ENCOUNTER — Encounter: Payer: Self-pay | Admitting: Family Medicine

## 2019-02-27 DIAGNOSIS — U071 COVID-19: Secondary | ICD-10-CM | POA: Insufficient documentation

## 2019-02-27 DIAGNOSIS — J9611 Chronic respiratory failure with hypoxia: Secondary | ICD-10-CM | POA: Diagnosis not present

## 2019-02-27 DIAGNOSIS — J40 Bronchitis, not specified as acute or chronic: Secondary | ICD-10-CM | POA: Diagnosis not present

## 2019-02-27 HISTORY — DX: COVID-19: U07.1

## 2019-02-27 MED ORDER — PREDNISONE 20 MG PO TABS: 20.0000 mg | ORAL_TABLET | Freq: Two times a day (BID) | ORAL | 0 refills | Status: DC

## 2019-02-27 MED ORDER — AZITHROMYCIN 250 MG PO TABS: ORAL_TABLET | ORAL | 0 refills | Status: DC

## 2019-02-27 NOTE — Progress Notes (Signed)
Virtual Visit via Telephone Note  I connected with Shawn Ramsey on 02/27/19 at 11:00 AM EST by telephone and verified that I am speaking with the correct person using two identifiers.  Location: Patient: home Provider: office  I discussed the limitations, risks, security and privacy concerns of performing an evaluation and management service by telephone and the availability of in person appointments. I also discussed with the patient that there may be a patient responsible charge related to this service. The patient expressed understanding and agreed to proceed.  C/o  Tetsted positive for covid o in ED 1 day ago chills, cough bloody phlegm , dark brown, fever    Observations/Objective: BP (!) 155/93   Ht 5\' 5"  (1.651 m)   Wt (!) 364 lb (165.1 kg)   BMI 60.57 kg/m  Good communication Alert and oriented x 3 Excess cough during speech    Assessment and Plan: COVID-19 virus infection Educated re need to Transport planner, ensure adequate fluid intake and nutrition Self monitor temperature at ome and degree of difficulty breathing, low threshold for re eval in ED due to severe lung disease  Chronic respiratory failure (HCC) High risk of decompensation with Covid 19 infection , communicated need for ED eval in the event of difficulty breathing and shortness of breath  Bronchitis Z pack and tessalon perles    Follow Up Instructions:    I discussed the assessment and treatment plan with the patient. The patient was provided an opportunity to ask questions and all were answered. The patient agreed with the plan and demonstrated an understanding of the instructions.   The patient was advised to call back or seek an in-person evaluation if the symptoms worsen or if the condition fails to improve as anticipated I provided 15 minutes of non-face-to-face time during this encounter.   Tula Nakayama, MD

## 2019-02-27 NOTE — Patient Instructions (Addendum)
F/U as before, call if you need me sooner  AZITHROMYCIN AND PREDNISONE ARE PRESCRIBED  Check temperature daily, monitor breathing and general condition, if worsening , go to the ED  Please plan to stop smoking entirely as this is damaging your lungs  10 Things You Can Do to Manage Your COVID-19 Symptoms at Home If you have possible or confirmed COVID-19: 1. Stay home from work and school. And stay away from other public places. If you must go out, avoid using any kind of public transportation, ridesharing, or taxis. 2. Monitor your symptoms carefully. If your symptoms get worse, call your healthcare provider immediately. 3. Get rest and stay hydrated. 4. If you have a medical appointment, call the healthcare provider ahead of time and tell them that you have or may have COVID-19. 5. For medical emergencies, call 911 and notify the dispatch personnel that you have or may have COVID-19. 6. Cover your cough and sneezes with a tissue or use the inside of your elbow. 7. Wash your hands often with soap and water for at least 20 seconds or clean your hands with an alcohol-based hand sanitizer that contains at least 60% alcohol. 8. As much as possible, stay in a specific room and away from other people in your home. Also, you should use a separate bathroom, if available. If you need to be around other people in or outside of the home, wear a mask. 9. Avoid sharing personal items with other people in your household, like dishes, towels, and bedding. 10. Clean all surfaces that are touched often, like counters, tabletops, and doorknobs. Use household cleaning sprays or wipes according to the label instructions. [email or web address].com 08/22/2018 This information is not intended to replace advice given to you by your health care provider. Make sure you discuss any questions you have with your health care provider. Document Revised: 01/24/2019 Document Reviewed: 01/24/2019 Elsevier Patient Education  [place]. COVID-19: How to Protect Yourself and Others Know how it spreads  There is currently no vaccine to prevent coronavirus disease 2019 (COVID-19).  The best way to prevent illness is to avoid being exposed to this virus.  The virus is thought to spread mainly from person-to-person. ? Between people who are in close contact with one another (within about 6 feet). ? Through respiratory droplets produced when an infected person coughs, sneezes or talks. ? These droplets can land in the mouths or noses of people who are nearby or possibly be inhaled into the lungs. ? COVID-19 may be spread by people who are not showing symptoms. Everyone should Clean your hands often  Wash your hands often with soap and water for at least 20 seconds especially after you have been in a public place, or after blowing your nose, coughing, or sneezing.  If soap and water are not readily available, use a hand sanitizer that contains at least 60% alcohol. Cover all surfaces of your hands and rub them together until they feel dry.  Avoid touching your eyes, nose, and mouth with unwashed hands. Avoid close contact  Limit contact with others as much as possible.  Avoid close contact with people who are sick.  Put distance between yourself and other people. ? Remember that some people without symptoms may be able to spread virus. ? This is especially important for people who are at higher risk of getting very [employer]cy Cover your mouth and nose with a mask when around others  You could spread COVID-19 to others even if you  do not feel sick.  Everyone should wear a mask in public settings and when around people not living in their household, especially when social distancing is difficult to maintain. ? Masks should not be placed on young children under age 29, anyone who has trouble breathing, or is unconscious, incapacitated  or otherwise unable to remove the mask without assistance.  The mask is meant to protect other people in case you are infected.  Do NOT use a facemask meant for a Dietitian.  Continue to keep about 6 feet between yourself and others. The mask is not a substitute for social distancing. Cover coughs and sneezes  Always cover your mouth and nose with a tissue when you cough or sneeze or use the inside of your elbow.  Throw used tissues in the trash.  Immediately wash your hands with soap and water for at least 20 seconds. If soap and water are not readily available, clean your hands with a hand sanitizer that contains at least 60% alcohol. Clean and disinfect  Clean AND disinfect frequently touched surfaces daily. This includes tables, doorknobs, light switches, countertops, handles, desks, phones, keyboards, toilets, faucets, and sinks. [employer]  If surfaces are dirty, clean them: Use detergent or soap and water prior to disinfection.  Then, use a household disinfectant. You can see a list of EPA-registered household disinfectants here. [email or web address].com 10/24/2018 This information is not intended to replace advice given to you by your health care provider. Make sure you discuss any questions you have with your health care provider. Document Revised: 11/01/2018 Document Reviewed: 08/30/2018 Elsevier Patient Education  [place].

## 2019-03-01 NOTE — Progress Notes (Signed)
Nurse triaged pt with severe lung disease who reported that he was having marked difficulty breathng and that he was on the way to te ED, she advised him to go immediately which he did

## 2019-03-03 NOTE — Assessment & Plan Note (Signed)
High risk of decompensation with Covid 19 infection , communicated need for ED eval in the event of difficulty breathing and shortness of breath

## 2019-03-03 NOTE — Assessment & Plan Note (Signed)
Educated re need to Transport planner, ensure adequate fluid intake and nutrition Self monitor temperature at ome and degree of difficulty breathing, low threshold for re eval in ED due to severe lung disease

## 2019-03-03 NOTE — Assessment & Plan Note (Signed)
Z pack and tessalon perles

## 2019-03-05 ENCOUNTER — Ambulatory Visit: Payer: Medicaid Other | Admitting: Family Medicine
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
## 2019-03-14 ENCOUNTER — Telehealth (INDEPENDENT_AMBULATORY_CARE_PROVIDER_SITE_OTHER): Payer: Medicaid Other | Admitting: Cardiology

## 2019-03-14 ENCOUNTER — Encounter: Payer: Self-pay | Admitting: Cardiology

## 2019-03-14 VITALS — BP 121/81 | HR 73 | Ht 65.0 in

## 2019-03-14 DIAGNOSIS — E662 Morbid (severe) obesity with alveolar hypoventilation: Secondary | ICD-10-CM

## 2019-03-14 DIAGNOSIS — I5032 Chronic diastolic (congestive) heart failure: Secondary | ICD-10-CM | POA: Diagnosis not present

## 2019-03-14 DIAGNOSIS — G4733 Obstructive sleep apnea (adult) (pediatric): Secondary | ICD-10-CM

## 2019-03-14 NOTE — Patient Instructions (Signed)
Medication Instructions:  Your physician recommends that you continue on your current medications as directed. Please refer to the Current Medication list given to you today.  *If you need a refill on your cardiac medications before your next appointment, please call your pharmacy*  Lab Work: NONE If you have labs (blood work) drawn today and your tests are completely normal, you will receive your results only by: [name] MyChart Message (if you have MyChart) OR . A paper copy in the mail If you have any lab test that is abnormal or we need to change your treatment, we will call you to review the results.  Testing/Procedures: NONE  Follow-Up: At [facility], you and your health needs are our priority.  As part of our continuing mission to provide you with exceptional heart care, we have created designated Provider Care Teams.  These Care Teams include your primary Cardiologist (physician) and Advanced Practice Providers (APPs -  Physician Assistants and Nurse Practitioners) who all work together to provide you with the care you need, when you need it.  Your next appointment:   6 month(s)  The format for your next appointment:   In Person  Provider:   Rozann Lesches, MD  Other Instructions None     Thank you for choosing [facility] !

## 2019-03-18 ENCOUNTER — Ambulatory Visit: Payer: Medicaid Other | Admitting: Nutrition

## 2019-04-08 ENCOUNTER — Ambulatory Visit: Payer: Medicaid Other | Admitting: Family Medicine
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
## 2019-04-10 ENCOUNTER — Encounter: Payer: Self-pay | Admitting: Family Medicine

## 2019-04-10 ENCOUNTER — Other Ambulatory Visit: Payer: Self-pay

## 2019-04-10 ENCOUNTER — Ambulatory Visit (INDEPENDENT_AMBULATORY_CARE_PROVIDER_SITE_OTHER): Payer: Medicaid Other | Admitting: Family Medicine

## 2019-04-10 VITALS — BP 126/80 | HR 94 | Temp 97.8°F | Resp 15 | Ht 65.0 in | Wt 367.1 lb

## 2019-04-10 DIAGNOSIS — Z6841 Body Mass Index (BMI) 40.0 and over, adult: Secondary | ICD-10-CM

## 2019-04-10 DIAGNOSIS — E1169 Type 2 diabetes mellitus with other specified complication: Secondary | ICD-10-CM | POA: Diagnosis not present

## 2019-04-10 DIAGNOSIS — E785 Hyperlipidemia, unspecified: Secondary | ICD-10-CM

## 2019-04-10 DIAGNOSIS — I10 Essential (primary) hypertension: Secondary | ICD-10-CM

## 2019-04-10 DIAGNOSIS — G4733 Obstructive sleep apnea (adult) (pediatric): Secondary | ICD-10-CM | POA: Diagnosis not present

## 2019-04-10 DIAGNOSIS — F1010 Alcohol abuse, uncomplicated: Secondary | ICD-10-CM

## 2019-04-10 DIAGNOSIS — F17200 Nicotine dependence, unspecified, uncomplicated: Secondary | ICD-10-CM

## 2019-04-10 DIAGNOSIS — E669 Obesity, unspecified: Secondary | ICD-10-CM
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
## 2019-04-10 NOTE — Assessment & Plan Note (Signed)
Diet controlled, 6.7%

## 2019-04-10 NOTE — Assessment & Plan Note (Signed)
Encouraged low fat diet 

## 2019-04-10 NOTE — Assessment & Plan Note (Signed)
Shawn Ramsey is re-educated about the importance of exercise daily to help with weight management. A minumum of 30 minutes daily is recommended. Additionally, importance of healthy food choices  with portion control discussed.   Wt Readings from Last 3 Encounters:  04/10/19 (!) 367 lb 1.9 oz (166.5 kg)  02/27/19 (!) 364 lb (165.1 kg)  02/26/19 (!) 364 lb (165.1 kg)

## 2019-04-10 NOTE — Patient Instructions (Addendum)
Happy New Year! May you have a year filled with hope, love, happiness and laughter.  I appreciate the opportunity to provide you with care for your health and wellness. Today we discussed: overall health  Follow up: 4 months   No labs or referrals today  Please continue to practice social distancing to keep you, your family, and our community safe.  If you must go out, please wear a mask and practice good handwashing.  It was a pleasure to see you and I look forward to continuing to work together on your health and well-being. Please do not hesitate to call the office if you need care or have questions about your care.  Have a wonderful day and week. With Gratitude, Cherly Beach, DNP, AGNP-BC

## 2019-04-10 NOTE — Assessment & Plan Note (Signed)
Asked about quitting: confirms they are currently smokes cigarettes Advise to quit smoking: Educated about QUITTING to reduce the risk of cancer, cardio and cerebrovascular disease. Assess willingness: Unwilling to quit at this time, but is working on cutting back. Assist with counseling and pharmacotherapy: Counseled for 5 minutes and literature provided. Arrange for follow up:  not quitting follow up in 3 months and continue to offer help.   

## 2019-04-10 NOTE — Assessment & Plan Note (Signed)
Shawn Ramsey is encouraged to maintain a well balanced diet that is low in salt. Controlled, continue current medication regimen. No refills needed.  Additionally, Shawn Ramsey is also reminded that exercise is beneficial for heart health and control of  Blood pressure. 30-60 minutes daily is recommended-walking was suggested.

## 2019-04-10 NOTE — Assessment & Plan Note (Signed)
On bipap nightly.

## 2019-04-11 ENCOUNTER — Encounter: Payer: Self-pay | Admitting: Family Medicine

## 2019-04-11 NOTE — Assessment & Plan Note (Signed)
I have encouraged reduction in alcohol consumption.

## 2019-04-15 ENCOUNTER — Encounter (HOSPITAL_COMMUNITY): Payer: Self-pay | Admitting: Psychiatry

## 2019-04-15 ENCOUNTER — Other Ambulatory Visit: Payer: Self-pay

## 2019-04-15 ENCOUNTER — Ambulatory Visit (INDEPENDENT_AMBULATORY_CARE_PROVIDER_SITE_OTHER): Payer: Medicaid Other | Admitting: Psychiatry

## 2019-04-15 DIAGNOSIS — F79 Unspecified intellectual disabilities: Secondary | ICD-10-CM

## 2019-04-15 DIAGNOSIS — F29 Unspecified psychosis not due to a substance or known physiological condition: Secondary | ICD-10-CM

## 2019-04-15 NOTE — Patient Instructions (Signed)
1. Next appointment: 6/21 at 1 PM

## 2019-04-25 ENCOUNTER — Ambulatory Visit: Payer: Medicaid Other | Admitting: Nutrition

## 2019-05-01 ENCOUNTER — Ambulatory Visit: Payer: Medicaid Other | Admitting: Family Medicine

## 2019-06-03 ENCOUNTER — Other Ambulatory Visit: Payer: Self-pay | Admitting: Family Medicine

## 2019-07-03 ENCOUNTER — Other Ambulatory Visit: Payer: Self-pay

## 2019-07-03 DIAGNOSIS — Z79899 Other long term (current) drug therapy: Secondary | ICD-10-CM | POA: Diagnosis not present

## 2019-07-03 DIAGNOSIS — F17210 Nicotine dependence, cigarettes, uncomplicated: Secondary | ICD-10-CM | POA: Insufficient documentation

## 2019-07-03 DIAGNOSIS — Z7984 Long term (current) use of oral hypoglycemic drugs: Secondary | ICD-10-CM | POA: Insufficient documentation

## 2019-07-03 DIAGNOSIS — R0600 Dyspnea, unspecified: Secondary | ICD-10-CM | POA: Insufficient documentation

## 2019-07-03 DIAGNOSIS — J449 Chronic obstructive pulmonary disease, unspecified: Secondary | ICD-10-CM | POA: Insufficient documentation

## 2019-07-03 DIAGNOSIS — J45909 Unspecified asthma, uncomplicated: Secondary | ICD-10-CM | POA: Insufficient documentation

## 2019-07-03 DIAGNOSIS — E119 Type 2 diabetes mellitus without complications: Secondary | ICD-10-CM | POA: Diagnosis not present

## 2019-07-03 DIAGNOSIS — Z8616 Personal history of COVID-19: Secondary | ICD-10-CM | POA: Diagnosis not present

## 2019-07-04 ENCOUNTER — Emergency Department (HOSPITAL_COMMUNITY): Payer: Medicaid Other

## 2019-07-04 ENCOUNTER — Emergency Department (HOSPITAL_COMMUNITY)
Admission: EM | Admit: 2019-07-04 | Discharge: 2019-07-04 | Disposition: A | Discharge status: Home / Self Care | Payer: Medicaid Other | Attending: Emergency Medicine | Admitting: Emergency Medicine

## 2019-07-04 ENCOUNTER — Encounter (HOSPITAL_COMMUNITY): Payer: Self-pay | Admitting: Emergency Medicine

## 2019-07-04 ENCOUNTER — Other Ambulatory Visit: Payer: Self-pay

## 2019-07-04 DIAGNOSIS — R0600 Dyspnea, unspecified: Secondary | ICD-10-CM

## 2019-07-04 LAB — COMPREHENSIVE METABOLIC PANEL
ALT: 16 U/L (ref 0–44)
AST: 18 U/L (ref 15–41)
Albumin: 4 g/dL (ref 3.5–5.0)
Alkaline Phosphatase: 67 U/L (ref 38–126)
Anion gap: 10 (ref 5–15)
BUN: 16 mg/dL (ref 6–20)
CO2: 28 mmol/L (ref 22–32)
Calcium: 8.6 mg/dL — ABNORMAL LOW (ref 8.9–10.3)
Chloride: 99 mmol/L (ref 98–111)
Creatinine, Ser: 0.71 mg/dL (ref 0.61–1.24)
GFR calc Af Amer: >60 mL/min (ref >60)
GFR calc non Af Amer: >60 mL/min (ref >60)
Glucose, Bld: 224 mg/dL — ABNORMAL HIGH (ref 70–99)
Potassium: 3.9 mmol/L (ref 3.5–5.1)
Sodium: 137 mmol/L (ref 135–145)
Total Bilirubin: 0.3 mg/dL (ref 0.3–1.2)
Total Protein: 7.5 g/dL (ref 6.5–8.1)

## 2019-07-04 LAB — BRAIN NATRIURETIC PEPTIDE: B Natriuretic Peptide: 16 pg/mL (ref 0.0–100.0)

## 2019-07-04 LAB — CBC WITH DIFFERENTIAL/PLATELET
Abs Immature Granulocytes: 0.02 10*3/uL (ref 0.00–0.07)
Basophils Absolute: 0 10*3/uL (ref 0.0–0.1)
Basophils Relative: 0 %
Eosinophils Absolute: 0.1 10*3/uL (ref 0.0–0.5)
Eosinophils Relative: 1 %
HCT: 40.5 % (ref 39.0–52.0)
Hemoglobin: 12.4 g/dL — ABNORMAL LOW (ref 13.0–17.0)
Immature Granulocytes: 0 %
Lymphocytes Relative: 19 %
Lymphs Abs: 1.5 10*3/uL (ref 0.7–4.0)
MCH: 28.1 pg (ref 26.0–34.0)
MCHC: 30.6 g/dL (ref 30.0–36.0)
MCV: 91.6 fL (ref 80.0–100.0)
Monocytes Absolute: 0.6 10*3/uL (ref 0.1–1.0)
Monocytes Relative: 7 %
Neutro Abs: 5.6 10*3/uL (ref 1.7–7.7)
Neutrophils Relative %: 73 %
Platelets: 328 10*3/uL (ref 150–400)
RBC: 4.42 MIL/uL (ref 4.22–5.81)
RDW: 14.8 % (ref 11.5–15.5)
WBC: 7.8 10*3/uL (ref 4.0–10.5)
nRBC: 0 % (ref 0.0–0.2)

## 2019-07-04 LAB — TROPONIN I (HIGH SENSITIVITY)
Troponin I (High Sensitivity): 6 ng/L (ref <18)
Troponin I (High Sensitivity): 6 ng/L (ref <18)

## 2019-07-04 MED ORDER — PREDNISONE 20 MG PO TABS
ORAL_TABLET | ORAL | 0 refills | Status: DC
Start: 2019-07-04 — End: 2019-11-07

## 2019-07-04 MED ORDER — ALBUTEROL SULFATE HFA 108 (90 BASE) MCG/ACT IN AERS
2.0000 | INHALATION_SPRAY | Freq: Once | RESPIRATORY_TRACT | Status: AC
  Administered 2019-07-04: 2 via RESPIRATORY_TRACT
  Filled 2019-07-04: qty 6.7

## 2019-07-04 MED ORDER — IOHEXOL 350 MG/ML SOLN
100.0000 mL | Freq: Once | INTRAVENOUS | Status: AC | PRN
  Administered 2019-07-04: 100 mL via INTRAVENOUS

## 2019-07-04 MED ORDER — PREDNISONE 50 MG PO TABS
60.0000 mg | ORAL_TABLET | Freq: Once | ORAL | Status: AC
  Administered 2019-07-04: 60 mg via ORAL
  Filled 2019-07-04: qty 1
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
## 2019-07-04 NOTE — ED Triage Notes (Signed)
Pt C/O Sob and left leg swelling X 1 month.

## 2019-07-26 ENCOUNTER — Other Ambulatory Visit: Payer: Self-pay

## 2019-07-26 ENCOUNTER — Telehealth: Payer: Self-pay

## 2019-07-26 MED ORDER — METFORMIN HCL ER 500 MG PO TB24: 500.0000 mg | ORAL_TABLET | Freq: Every day | ORAL | 1 refills | Status: DC

## 2019-07-26 NOTE — Telephone Encounter (Signed)
Please call in Metformin to [facility]
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
## 2019-08-08 ENCOUNTER — Ambulatory Visit: Payer: Medicaid Other | Admitting: Family Medicine

## 2019-08-12 ENCOUNTER — Encounter (HOSPITAL_COMMUNITY): Payer: Self-pay | Admitting: Psychiatry

## 2019-08-12 ENCOUNTER — Telehealth (INDEPENDENT_AMBULATORY_CARE_PROVIDER_SITE_OTHER): Payer: Medicaid Other | Admitting: Psychiatry

## 2019-08-12 ENCOUNTER — Other Ambulatory Visit: Payer: Self-pay

## 2019-08-12 DIAGNOSIS — F330 Major depressive disorder, recurrent, mild: Secondary | ICD-10-CM

## 2019-08-12 DIAGNOSIS — F79 Unspecified intellectual disabilities: Secondary | ICD-10-CM | POA: Diagnosis not present

## 2019-08-22 ENCOUNTER — Other Ambulatory Visit: Payer: Self-pay

## 2019-08-22 ENCOUNTER — Ambulatory Visit (INDEPENDENT_AMBULATORY_CARE_PROVIDER_SITE_OTHER): Payer: Medicaid Other | Admitting: Family Medicine

## 2019-08-22 VITALS — BP 138/82 | HR 100 | Temp 98.2°F | Resp 18 | Ht 65.0 in | Wt 380.0 lb

## 2019-08-22 DIAGNOSIS — J9611 Chronic respiratory failure with hypoxia: Secondary | ICD-10-CM | POA: Diagnosis not present

## 2019-08-22 DIAGNOSIS — G4733 Obstructive sleep apnea (adult) (pediatric): Secondary | ICD-10-CM

## 2019-08-22 DIAGNOSIS — Z6841 Body Mass Index (BMI) 40.0 and over, adult: Secondary | ICD-10-CM

## 2019-08-22 DIAGNOSIS — M79605 Pain in left leg: Secondary | ICD-10-CM | POA: Diagnosis not present

## 2019-08-22 DIAGNOSIS — E1169 Type 2 diabetes mellitus with other specified complication: Secondary | ICD-10-CM

## 2019-08-22 DIAGNOSIS — E669 Obesity, unspecified: Secondary | ICD-10-CM | POA: Diagnosis not present

## 2019-08-22 DIAGNOSIS — F1010 Alcohol abuse, uncomplicated: Secondary | ICD-10-CM

## 2019-08-22 LAB — POCT GLYCOSYLATED HEMOGLOBIN (HGB A1C): Hemoglobin A1C: 7.1 % — AB (ref 4.0–5.6)

## 2019-08-22 NOTE — Assessment & Plan Note (Addendum)
Needs pulmonary eval, progressively worsening

## 2019-08-22 NOTE — Patient Instructions (Addendum)
F/u in 3 months, call if you need me sooner  PLEASE follow through on getting in patient rehab for alcohol and drug use, nursing will help you get the info you need to do this   You are referred to pulmonary Dr  Blood sugar has increased , so need to stop sweets, no med change    Thanks for choosing [facility], we consider it a privelige to serve you.

## 2019-08-22 NOTE — Assessment & Plan Note (Addendum)
Needs pulm eval reports increased dyspnea, and has sever sleep apnea requiring Bipap, likely complicated with both cardiac and pulmonary disease

## 2019-08-22 NOTE — Assessment & Plan Note (Signed)
Pt reports chronic leg pain and requests pain management eval, will refer

## 2019-08-22 NOTE — Assessment & Plan Note (Addendum)
Ready for in patient  Rehab, reports has been in the past with good result and needs help at this time, we will provide resources and assist as much as possible

## 2019-08-24 ENCOUNTER — Encounter: Payer: Self-pay | Admitting: Family Medicine

## 2019-08-24 NOTE — Assessment & Plan Note (Signed)
Deteriorated, needs to reuce carb intake Shawn Ramsey is reminded of the importance of commitment to daily physical activity for 30 minutes or more, as able and the need to limit carbohydrate intake to 30 to 60 grams per meal to help with blood sugar control.   The need to take medication as prescribed, test blood sugar as directed, and to call between visits if there is a concern that blood sugar is uncontrolled is also discussed.   Shawn Ramsey is reminded of the importance of daily foot exam, annual eye examination, and good blood sugar, blood pressure and cholesterol control.  Diabetic Labs Latest Ref Rng & Units 08/22/2019 07/04/2019 02/26/2019 01/23/2019 01/03/2019  HbA1c 4.0 - 5.6 % 7.1(A) - - - 6.7(H)  Microalbumin Not Estab. ug/mL - - - - -  Micro/Creat Ratio 0.0 - 30.0 mg/g creat - - - - -  Chol <200 mg/dL - - - - -  HDL > OR = 40 mg/dL - - - - -  Calc LDL mg/dL (calc) - - - - -  Triglycerides <150 mg/dL - - - 123 -  Creatinine 0.61 - 1.24 mg/dL - 0.71 0.76 0.79 0.73   BP/Weight 08/22/2019 07/04/2019 04/10/2019 03/14/2019 02/27/2019 02/26/2019 04/26/91  Systolic BP 818 299 371 696 789 381 -  Diastolic BP 82 62 80 81 93 93 -  Wt. (Lbs) 380 376 367.12 - 364 364 365  BMI 63.24 62.57 61.09 60.57 60.57 60.57 60.74  Some encounter information is confidential and restricted. Go to Review Flowsheets activity to see all data.   Foot/eye exam completion dates 01/09/2018 09/19/2016  Foot Form Completion Done Done

## 2019-08-24 NOTE — Progress Notes (Signed)
Shawn Ramsey     MRN: [id number]      DOB: 04/30/1980   HPI Shawn Ramsey is here for follow up and re-evaluation of chronic medical conditions,  C/oresuming alcohol and needing to go to inpatient facility for help, also admits to using illicit drugs also C/o feeling depressed and frustrated as he is unable to lose weight C/o left leg pain and requested appt at pain clinic, but after discussion I advised against this due to his addition problem, he and his Mother who is present both agree  C/o increased shortness of breath x past 6 weeks   ROS Denies recent fever or chills. Denies sinus pressure, nasal congestion, ear pain or sore throat. Denies chest congestion, productive cough or wheezing. Denies chest pains, palpitations and leg swelling Denies abdominal pain, nausea, vomiting,diarrhea or constipation.   Denies dysuria, frequency, hesitancy or incontinence. Denies joint pain, swelling and limitation in mobility. Denies headaches, seizures, numbness, or tingling. Denies depression, anxiety or insomnia. Denies skin break down or rash.   PE  BP 138/82   Pulse 100   Temp 98.2 F (36.8 C)   Resp 18   Ht 5\' 5"  (1.651 m)   Wt (!) 380 lb (172.4 kg)   SpO2 94%   BMI 63.24 kg/m   Patient alert and oriented and in no cardiopulmonary distress.  HEENT: No facial asymmetry, EOMI,     Neck supple .  Chest: Clear to auscultation bilaterally.  CVS: S1, S2 no murmurs, no S3.Regular rate.  ABD: Soft non tender.   Ext: No edema  MS: Adequate ROM spine, shoulders, hips and knees.  Skin: Intact, no ulcerations or rash noted.  Psych: Good eye contact, normal affect. Memory intact not anxious or depressed appearing.  CNS: CN 2-12 intact, power,  normal throughout.no focal deficits noted.   Assessment & Plan  Leg pain, anterior, left Pt reports chronic leg pain and requests pain management eval, will refer   Chronic respiratory failure (HCC) Needs pulmonary eval,  progressively worsening  OSA (obstructive sleep apnea) Needs pulm eval reports increased dyspnea, and has sever sleep apnea requiring Bipap, likely complicated with both cardiac and pulmonary disease   Alcohol consumption binge drinking Ready for in patient  Rehab, reports has been in the past with good result and needs help at this time, we will provide resources and assist as much as possible  Morbid obesity with BMI of 60.0-69.9, adult [facility])  Patient re-educated about  the importance of commitment to a  minimum of 150 minutes of exercise per week as able.  The importance of healthy food choices with portion control discussed, as well as eating regularly and within a 12 hour window most days. The need to choose "clean , green" food 50 to 75% of the time is discussed, as well as to make water the primary drink and set a goal of 64 ounces water daily.    Weight /BMI 08/22/2019 07/04/2019 04/10/2019  WEIGHT 380 lb 376 lb 367 lb 1.9 oz  HEIGHT 5\' 5"  5\' 5"  5\' 5"   BMI 63.24 kg/m2 62.57 kg/m2 61.09 kg/m2  Some encounter information is confidential and restricted. Go to Review Flowsheets activity to see all data.      Diabetes mellitus type 2 in obese (HCC) Deteriorated, needs to reuce carb intake Shawn Ramsey is reminded of the importance of commitment to daily physical activity for 30 minutes or more, as able and the need to limit carbohydrate intake to 30 to 60  grams per meal to help with blood sugar control.   The need to take medication as prescribed, test blood sugar as directed, and to call between visits if there is a concern that blood sugar is uncontrolled is also discussed.   Shawn Ramsey is reminded of the importance of daily foot exam, annual eye examination, and good blood sugar, blood pressure and cholesterol control.  Diabetic Labs Latest Ref Rng & Units 08/22/2019 07/04/2019 02/26/2019 01/23/2019 01/03/2019  HbA1c 4.0 - 5.6 % 7.1(A) - - - 6.7(H)  Microalbumin Not Estab. ug/mL - - - -  -  Micro/Creat Ratio 0.0 - 30.0 mg/g creat - - - - -  Chol <200 mg/dL - - - - -  HDL > OR = 40 mg/dL - - - - -  Calc LDL mg/dL (calc) - - - - -  Triglycerides <150 mg/dL - - - 123 -  Creatinine 0.61 - 1.24 mg/dL - 0.71 0.76 0.79 0.73   BP/Weight 08/22/2019 07/04/2019 04/10/2019 03/14/2019 02/27/2019 02/26/2019 91/05/7827  Systolic BP 562 130 865 784 696 295 -  Diastolic BP 82 62 80 81 93 93 -  Wt. (Lbs) 380 376 367.12 - 364 364 365  BMI 63.24 62.57 61.09 60.57 60.57 60.57 60.74  Some encounter information is confidential and restricted. Go to Review Flowsheets activity to see all data.   Foot/eye exam completion dates 01/09/2018 09/19/2016  Foot Form Completion Done Done

## 2019-08-24 NOTE — Assessment & Plan Note (Signed)
  Patient re-educated about  the importance of commitment to a  minimum of 150 minutes of exercise per week as able.  The importance of healthy food choices with portion control discussed, as well as eating regularly and within a 12 hour window most days. The need to choose "clean , green" food 50 to 75% of the time is discussed, as well as to make water the primary drink and set a goal of 64 ounces water daily.    Weight /BMI 08/22/2019 07/04/2019 04/10/2019  WEIGHT 380 lb 376 lb 367 lb 1.9 oz  HEIGHT 5\' 5"  5\' 5"  5\' 5"   BMI 63.24 kg/m2 62.57 kg/m2 61.09 kg/m2  Some encounter information is confidential and restricted. Go to Review Flowsheets activity to see all data.
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
## 2019-09-13 ENCOUNTER — Ambulatory Visit: Payer: Medicaid Other | Admitting: Cardiology
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
## 2019-09-17 ENCOUNTER — Encounter (HOSPITAL_COMMUNITY): Payer: Self-pay | Admitting: Psychiatry

## 2019-09-17 ENCOUNTER — Telehealth (INDEPENDENT_AMBULATORY_CARE_PROVIDER_SITE_OTHER): Payer: Medicaid Other | Admitting: Psychiatry

## 2019-09-17 ENCOUNTER — Other Ambulatory Visit: Payer: Self-pay

## 2019-09-17 DIAGNOSIS — F3341 Major depressive disorder, recurrent, in partial remission: Secondary | ICD-10-CM

## 2019-09-17 DIAGNOSIS — F79 Unspecified intellectual disabilities: Secondary | ICD-10-CM

## 2019-09-17 NOTE — Telephone Encounter (Signed)
This encounter was created in error - please disregard.

## 2019-09-17 NOTE — Patient Instructions (Signed)
1. Next appointment: 9/28 at 1:40 2. Consider contacting [facility] for group therapy given his alcohol use

## 2019-09-23 ENCOUNTER — Institutional Professional Consult (permissible substitution): Payer: Medicaid Other | Admitting: Pulmonary Disease

## 2019-09-24 ENCOUNTER — Telehealth: Payer: Self-pay

## 2019-09-24 ENCOUNTER — Other Ambulatory Visit: Payer: Self-pay | Admitting: Family Medicine

## 2019-09-24 ENCOUNTER — Other Ambulatory Visit: Payer: Self-pay

## 2019-09-24 DIAGNOSIS — G4733 Obstructive sleep apnea (adult) (pediatric): Secondary | ICD-10-CM

## 2019-09-24 NOTE — Telephone Encounter (Signed)
Shawn Ramsey is calling she states that [place] needs a new script for Cpap supplies

## 2019-09-24 NOTE — Telephone Encounter (Signed)
Order sent to [facility] for cpap supplies

## 2019-10-26 ENCOUNTER — Other Ambulatory Visit: Payer: Self-pay | Admitting: Family Medicine
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
## 2019-11-07 ENCOUNTER — Ambulatory Visit (INDEPENDENT_AMBULATORY_CARE_PROVIDER_SITE_OTHER): Payer: Medicaid Other | Admitting: Pulmonary Disease

## 2019-11-07 ENCOUNTER — Other Ambulatory Visit: Payer: Self-pay

## 2019-11-07 ENCOUNTER — Encounter: Payer: Self-pay | Admitting: Pulmonary Disease

## 2019-11-07 VITALS — BP 162/92 | HR 94 | Temp 97.1°F | Ht 65.0 in | Wt 385.0 lb

## 2019-11-07 DIAGNOSIS — J9611 Chronic respiratory failure with hypoxia: Secondary | ICD-10-CM | POA: Diagnosis not present

## 2019-11-07 DIAGNOSIS — E662 Morbid (severe) obesity with alveolar hypoventilation: Secondary | ICD-10-CM

## 2019-11-07 DIAGNOSIS — G4733 Obstructive sleep apnea (adult) (pediatric): Secondary | ICD-10-CM

## 2019-11-07 DIAGNOSIS — J449 Chronic obstructive pulmonary disease, unspecified: Secondary | ICD-10-CM

## 2019-11-07 DIAGNOSIS — J9612 Chronic respiratory failure with hypercapnia: Secondary | ICD-10-CM

## 2019-11-07 DIAGNOSIS — R0600 Dyspnea, unspecified: Secondary | ICD-10-CM

## 2019-11-07 DIAGNOSIS — G8929 Other chronic pain: Secondary | ICD-10-CM

## 2019-11-07 DIAGNOSIS — R0609 Other forms of dyspnea: Secondary | ICD-10-CM

## 2019-11-07 DIAGNOSIS — W3400XS Accidental discharge from unspecified firearms or gun, sequela: Secondary | ICD-10-CM

## 2019-11-07 DIAGNOSIS — M79605 Pain in left leg: Secondary | ICD-10-CM

## 2019-11-07 MED ORDER — FLUTICASONE-SALMETEROL 250-50 MCG/DOSE IN AEPB
1.0000 | INHALATION_SPRAY | Freq: Two times a day (BID) | RESPIRATORY_TRACT | 11 refills | Status: DC
Start: 2019-11-07 — End: 2020-03-13

## 2019-11-07 NOTE — Patient Instructions (Addendum)
Will schedule chest xray, lab tests, and Bipap titration study   Advair one puff twice per day and rinse mouth after each use  Will arrange for referral to [facility]
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
## 2019-11-11 ENCOUNTER — Ambulatory Visit (HOSPITAL_COMMUNITY)
Admission: RE | Admit: 2019-11-11 | Discharge: 2019-11-11 | Disposition: A | Discharge status: Home / Self Care | Payer: Medicaid Other | Source: Ambulatory Visit | Attending: Pulmonary Disease | Admitting: Pulmonary Disease

## 2019-11-11 ENCOUNTER — Other Ambulatory Visit: Payer: Self-pay

## 2019-11-11 DIAGNOSIS — R0609 Other forms of dyspnea: Secondary | ICD-10-CM

## 2019-11-11 DIAGNOSIS — R0600 Dyspnea, unspecified: Secondary | ICD-10-CM | POA: Diagnosis present

## 2019-11-11 DIAGNOSIS — J449 Chronic obstructive pulmonary disease, unspecified: Secondary | ICD-10-CM | POA: Diagnosis not present

## 2019-11-14 ENCOUNTER — Telehealth: Payer: Self-pay | Admitting: Pulmonary Disease

## 2019-11-14 NOTE — Telephone Encounter (Signed)
DG Chest 2 View  Result Date: 11/11/2019 CLINICAL DATA:  Shortness of breath and cough.  Morbid obesity. EXAM: CHEST - 2 VIEW COMPARISON:  07/04/2019 FINDINGS: Stable mild cardiomegaly. Pulmonary hyperinflation again seen, suspicious for COPD. Both lungs are clear. No evidence of pneumothorax or pleural effusion. IMPRESSION: Mild cardiomegaly and pulmonary hyperinflation. No active lung disease. Electronically Signed   By: Marlaine Hind M.D.   On: 11/11/2019 16:52     Please let him know his chest xray shows changes from sleep apnea, respiratory failure needing oxygen and high blood pressure.  He needs to get lab work done that was ordered at his visit on 11/07/19.

## 2019-11-14 NOTE — Telephone Encounter (Signed)
Called and spoke to patient about xray results per Dr Halford Chessman. Patient advised to go get his lab work completed so Dr Halford Chessman can follow up. Patient stated he didn't get his lab work done and expressed understanding of needing this done, patient stated he is going to [facility] to get labs drawn now (11/14/2019).

## 2019-11-15 ENCOUNTER — Other Ambulatory Visit: Payer: Self-pay

## 2019-11-15 ENCOUNTER — Other Ambulatory Visit (HOSPITAL_COMMUNITY)
Admission: RE | Admit: 2019-11-15 | Discharge: 2019-11-15 | Disposition: A | Discharge status: Home / Self Care | Payer: Medicaid Other | Source: Ambulatory Visit | Attending: Pulmonary Disease | Admitting: Pulmonary Disease

## 2019-11-15 DIAGNOSIS — E662 Morbid (severe) obesity with alveolar hypoventilation: Secondary | ICD-10-CM | POA: Insufficient documentation

## 2019-11-15 DIAGNOSIS — R0600 Dyspnea, unspecified: Secondary | ICD-10-CM | POA: Diagnosis present

## 2019-11-15 DIAGNOSIS — J449 Chronic obstructive pulmonary disease, unspecified: Secondary | ICD-10-CM | POA: Diagnosis present

## 2019-11-15 DIAGNOSIS — R0609 Other forms of dyspnea: Secondary | ICD-10-CM

## 2019-11-15 LAB — CBC WITH DIFFERENTIAL/PLATELET
Abs Immature Granulocytes: 0.03 10*3/uL (ref 0.00–0.07)
Basophils Absolute: 0 10*3/uL (ref 0.0–0.1)
Basophils Relative: 0 %
Eosinophils Absolute: 0.2 10*3/uL (ref 0.0–0.5)
Eosinophils Relative: 3 %
HCT: 42.5 % (ref 39.0–52.0)
Hemoglobin: 13.1 g/dL (ref 13.0–17.0)
Immature Granulocytes: 0 %
Lymphocytes Relative: 22 %
Lymphs Abs: 1.7 10*3/uL (ref 0.7–4.0)
MCH: 27.6 pg (ref 26.0–34.0)
MCHC: 30.8 g/dL (ref 30.0–36.0)
MCV: 89.7 fL (ref 80.0–100.0)
Monocytes Absolute: 0.6 10*3/uL (ref 0.1–1.0)
Monocytes Relative: 7 %
Neutro Abs: 5.2 10*3/uL (ref 1.7–7.7)
Neutrophils Relative %: 68 %
Platelets: 315 10*3/uL (ref 150–400)
RBC: 4.74 MIL/uL (ref 4.22–5.81)
RDW: 15 % (ref 11.5–15.5)
WBC: 7.7 10*3/uL (ref 4.0–10.5)
nRBC: 0 % (ref 0.0–0.2)

## 2019-11-15 LAB — COMPREHENSIVE METABOLIC PANEL
ALT: 23 U/L (ref 0–44)
AST: 19 U/L (ref 15–41)
Albumin: 4.1 g/dL (ref 3.5–5.0)
Alkaline Phosphatase: 67 U/L (ref 38–126)
Anion gap: 11 (ref 5–15)
BUN: 15 mg/dL (ref 6–20)
CO2: 30 mmol/L (ref 22–32)
Calcium: 8.8 mg/dL — ABNORMAL LOW (ref 8.9–10.3)
Chloride: 97 mmol/L — ABNORMAL LOW (ref 98–111)
Creatinine, Ser: 0.68 mg/dL (ref 0.61–1.24)
GFR calc Af Amer: >60 mL/min (ref >60)
GFR calc non Af Amer: >60 mL/min (ref >60)
Glucose, Bld: 135 mg/dL — ABNORMAL HIGH (ref 70–99)
Potassium: 3.6 mmol/L (ref 3.5–5.1)
Sodium: 138 mmol/L (ref 135–145)
Total Bilirubin: 0.6 mg/dL (ref 0.3–1.2)
Total Protein: 7.7 g/dL (ref 6.5–8.1)

## 2019-11-15 LAB — TSH: TSH: 1.702 u[IU]/mL (ref 0.350–4.500)

## 2019-11-16 ENCOUNTER — Telehealth: Payer: Self-pay | Admitting: Pulmonary Disease

## 2019-11-16 NOTE — Telephone Encounter (Signed)
CMP Latest Ref Rng & Units 11/15/2019 07/04/2019 02/26/2019  Glucose 70 - 99 mg/dL 135(H) 224(H) 111(H)  BUN 6 - 20 mg/dL 15 16 11   Creatinine 0.61 - 1.24 mg/dL 0.68 0.71 0.76  Sodium 135 - 145 mmol/L 138 137 138  Potassium 3.5 - 5.1 mmol/L 3.6 3.9 3.4(L)  Chloride 98 - 111 mmol/L 97(L) 99 98  CO2 22 - 32 mmol/L 30 28 30   Calcium 8.9 - 10.3 mg/dL 8.8(L) 8.6(L) 8.6(L)  Total Protein 6.5 - 8.1 g/dL 7.7 7.5 -  Total Bilirubin 0.3 - 1.2 mg/dL 0.6 0.3 -  Alkaline Phos 38 - 126 U/L 67 67 -  AST 15 - 41 U/L 19 18 -  ALT 0 - 44 U/L 23 16 -    CBC Latest Ref Rng & Units 11/15/2019 07/04/2019 02/26/2019  WBC 4.0 - 10.5 K/uL 7.7 7.8 6.3  Hemoglobin 13.0 - 17.0 g/dL 13.1 12.4(L) 12.6(L)  Hematocrit 39 - 52 % 42.5 40.5 41.1  Platelets 150 - 400 K/uL 315 328 269    Lab Results  Component Value Date   TSH 1.702 11/15/2019     Please let him know his lab tests are normal.

## 2019-11-18 ENCOUNTER — Ambulatory Visit: Payer: Medicaid Other | Attending: Pulmonary Disease | Admitting: Pulmonary Disease

## 2019-11-18 ENCOUNTER — Other Ambulatory Visit: Payer: Self-pay

## 2019-11-18 DIAGNOSIS — G4733 Obstructive sleep apnea (adult) (pediatric): Secondary | ICD-10-CM

## 2019-11-18 DIAGNOSIS — J9611 Chronic respiratory failure with hypoxia: Secondary | ICD-10-CM | POA: Insufficient documentation

## 2019-11-18 DIAGNOSIS — E662 Morbid (severe) obesity with alveolar hypoventilation: Secondary | ICD-10-CM | POA: Insufficient documentation

## 2019-11-18 DIAGNOSIS — J9612 Chronic respiratory failure with hypercapnia: Secondary | ICD-10-CM | POA: Diagnosis not present

## 2019-11-18 DIAGNOSIS — Z6841 Body Mass Index (BMI) 40.0 and over, adult: Secondary | ICD-10-CM | POA: Insufficient documentation

## 2019-11-18 NOTE — Telephone Encounter (Signed)
Patient is aware of results and voiced his understanding.  Nothing further needed.  

## 2019-11-19 ENCOUNTER — Telehealth (INDEPENDENT_AMBULATORY_CARE_PROVIDER_SITE_OTHER): Payer: Medicaid Other | Admitting: Psychiatry

## 2019-11-19 ENCOUNTER — Encounter (HOSPITAL_COMMUNITY): Payer: Self-pay | Admitting: Psychiatry

## 2019-11-19 DIAGNOSIS — F79 Unspecified intellectual disabilities: Secondary | ICD-10-CM

## 2019-11-19 DIAGNOSIS — F3341 Major depressive disorder, recurrent, in partial remission: Secondary | ICD-10-CM

## 2019-11-19 NOTE — Patient Instructions (Signed)
1. Next appointment:1/25 at 1 PM

## 2019-11-25 ENCOUNTER — Ambulatory Visit: Payer: Medicaid Other | Admitting: Family Medicine

## 2019-11-26 ENCOUNTER — Ambulatory Visit: Payer: Medicaid Other | Admitting: Family Medicine

## 2019-12-01 NOTE — Procedures (Signed)
Titus A. Merlene Laughter, MD     [email or web address]             NOCTURNAL POLYSOMNOGRAPHY   LOCATION: [facility]  Patient Name: Shawn, Ramsey Date: 11/18/2019 Gender: Male D.O.B: Dec 05, 1980 Age (years): 84 Referring Provider: Chesley Mires MD, ABSM Height (inches): 65 Interpreting Physician: Phillips Odor MD, ABSM Weight (lbs): 385 RPSGT: Rosebud Poles BMI: 64 MRN: [id number] Neck Size: 23.00 CLINICAL INFORMATION The patient is referred for a BiPAP titration to treat sleep apnea.  Date of NPSG, Split Night or HST:  SLEEP STUDY TECHNIQUE As per the AASM Manual for the Scoring of Sleep and Associated Events v2.3 (April 2016) with a hypopnea requiring 4% desaturations.  The channels recorded and monitored were frontal, central and occipital EEG, electrooculogram (EOG), submentalis EMG (chin), nasal and oral airflow, thoracic and abdominal wall motion, anterior tibialis EMG, snore microphone, electrocardiogram, and pulse oximetry. Bilevel positive airway pressure (BPAP) was initiated at the beginning of the study and titrated to treat sleep-disordered breathing.  MEDICATIONS Medications self-administered by patient taken the night of the study : N/A  Current Outpatient Medications:  .  acetaminophen (TYLENOL) 500 MG tablet, Take 1 tablet (500 mg total) by mouth every 4 (four) hours as needed for moderate pain or fever., Disp: 30 tablet, Rfl: 0 .  albuterol (PROVENTIL) (2.5 MG/3ML) 0.083% nebulizer solution, Take 3 mLs (2.5 mg total) by nebulization every 6 (six) hours as needed for wheezing or shortness of breath., Disp: 75 mL, Rfl: 12 .  albuterol (VENTOLIN HFA) 108 (90 Base) MCG/ACT inhaler, Inhale 1-2 puffs into the lungs every 6 (six) hours as needed for wheezing or shortness of breath., Disp: 8 g, Rfl: 0 .  atorvastatin (LIPITOR) 40 MG tablet, Take 1 tablet by mouth once daily, Disp: 90 tablet, Rfl: 0 .  blood glucose meter kit and supplies KIT,  Dispense based on patient and insurance preference. Once daily testing dx E11.9, Disp: 1 each, Rfl: 0 .  ergocalciferol (VITAMIN D2) 1.25 MG (50000 UT) capsule, Take 1 capsule (50,000 Units total) by mouth once a week. One capsule once weekly, Disp: 12 capsule, Rfl: 1 .  Fluticasone-Salmeterol (ADVAIR) 250-50 MCG/DOSE AEPB, Inhale 1 puff into the lungs every 12 (twelve) hours., Disp: 60 each, Rfl: 11 .  ipratropium-albuterol (DUONEB) 0.5-2.5 (3) MG/3ML SOLN, Take 3 mLs by nebulization every 6 (six) hours as needed (for shortness of breath)., Disp: 360 mL, Rfl: 0 .  metFORMIN (GLUCOPHAGE-XR) 500 MG 24 hr tablet, Take 1 tablet by mouth once daily with breakfast, Disp: 30 tablet, Rfl: 0 .  orlistat (XENICAL) 120 MG capsule, Take 1 capsule (120 mg total) by mouth 3 (three) times daily with meals. (Patient not taking: Reported on 11/07/2019), Disp: 90 capsule, Rfl: 3 .  potassium chloride SA (KLOR-CON) 20 MEQ tablet, Take 1 tablet (20 mEq total) by mouth 2 (two) times daily., Disp: 180 tablet, Rfl: 3 .  torsemide (DEMADEX) 20 MG tablet, Take 40 mg in the AM and 20 mg in the PM Daily, Disp: 270 tablet, Rfl: 3  RESPIRATORY PARAMETERS Optimal IPAP Pressure (cm): 24 AHI at Optimal Pressure (/hr) 101.7 Optimal EPAP Pressure (cm): 19   Overall Minimal O2 (%): 55.0 Minimal O2 at Optimal Pressure (%): 84.0 SLEEP ARCHITECTURE Start Time: 10:57:20 PM Stop Time: 5:35:51 AM Total Time (min): 398.5 Total Sleep Time (min): 395.2 Sleep Latency (min): 0.8 Sleep Efficiency (%): 99.2% REM Latency (min): 335.5 WASO (min): 2.5 Stage N1 (%): 0.5% Stage N2 (%): 62.4% Stage N3 (%):  34.3% Stage R (%): 2.8 Supine (%): 0.00 Arousal Index (/hr): 102.2     CARDIAC DATA The 2 lead EKG demonstrated sinus rhythm. The mean heart rate was 79.3 beats per minute. Other EKG findings include: PVCs.  LEG MOVEMENT DATA The total Periodic Limb Movements of Sleep (PLMS) were 0. The PLMS index was 0.0. A PLMS index of <15 is considered  normal in adults.  IMPRESSIONS The optimal BIPAP selected for this patient are ( 24 / 19cm of water). These levels were sub-optimal and therefore supplemental oxygen 2L is suggested.    Shawn Metz, MD Diplomate, American Board of Sleep Medicine.  ELECTRONICALLY SIGNED ON:  12/01/2019, 9:36 PM [place] PH: [phone]   FX: [phone] [place]

## 2019-12-02 ENCOUNTER — Telehealth: Payer: Self-pay | Admitting: Pulmonary Disease

## 2019-12-02 DIAGNOSIS — G4733 Obstructive sleep apnea (adult) (pediatric): Secondary | ICD-10-CM

## 2019-12-02 NOTE — Telephone Encounter (Signed)
Called and spoke with patient about sleep study result per Dr Halford Chessman. Patient agreeable to Bipap with 3 liters of oxygen. Order placed per Dr. Halford Chessman. Explained that follow up office visit needed 2 months after Bipap set up and the phone line got disconnected. Attempted to call back x2, no answer and was unable to leave message due to no voicemail. Will try to call back.

## 2019-12-02 NOTE — Telephone Encounter (Signed)
Bipap titration 11/18/19 >> Bipap 24/19 with 3 liters oxygen.  Sub-optimal titration.   Please let him know that based on his sleep study he needs to be set up with Bipap and 3 liters oxygen at night.  Please send order to arrange for auto Bipap with maximum IPAP 25 cm H2O, minimum EPAP 14 cm H2O, Pressure support 4 cm H2O.  He needs 3 liters oxygen to set up with Bipap.  Please arrange for follow up in [place] office 2 months after getting Bipap set up.

## 2019-12-03 ENCOUNTER — Other Ambulatory Visit: Payer: Self-pay

## 2019-12-03 ENCOUNTER — Telehealth: Payer: Self-pay | Admitting: Pulmonary Disease

## 2019-12-03 DIAGNOSIS — G4733 Obstructive sleep apnea (adult) (pediatric): Secondary | ICD-10-CM

## 2019-12-03 NOTE — Telephone Encounter (Signed)
Please have his DME change his Bipap to 24/19 cm H2O with 3 liters oxygen at night.

## 2019-12-03 NOTE — Telephone Encounter (Signed)
Spoke with [name] at [facility]  She states pt not eligible for new BIPAP until Feb 2022  We ordered a new machine with settings of 24/14 and she states pt currently using 18/8  She states with his machine being on standard setting and not auto, she can not give a pressure support  Please confirm that you want settings of 24/14 without pressure support  Thanks

## 2019-12-03 NOTE — Addendum Note (Signed)
Addended by: Merrilee Seashore on: 12/03/2019 10:13 AM   Modules accepted: Orders

## 2019-12-03 NOTE — Telephone Encounter (Addendum)
Spoke with patient and patient's mother, Dawood Spitler, per [facility] and patient ok about Bipap settings being changed per Dr Halford Chessman due to [facility] stating that patient does not have a Bipap machine that will do AUTO bipap setting and scheduled a follow up office visit for Friday 01/31/20 at noon. Both patient and [name] expressed understanding and agreeable to time, date and location of appointment.

## 2019-12-04 NOTE — Telephone Encounter (Signed)
Spoke with Shawn Ramsey and notified of response per Dr Halford Chessman  She states she received order for this already and nothing further needed

## 2019-12-04 NOTE — Telephone Encounter (Signed)
Bipap settings are suppose to be 24/19 cm H2O.

## 2020-01-08 ENCOUNTER — Emergency Department (HOSPITAL_COMMUNITY)
Admission: EM | Admit: 2020-01-08 | Discharge: 2020-01-09 | Disposition: A | Discharge status: Home / Self Care | Payer: Medicaid Other | Attending: Emergency Medicine | Admitting: Emergency Medicine

## 2020-01-08 ENCOUNTER — Encounter (HOSPITAL_COMMUNITY): Payer: Self-pay | Admitting: Emergency Medicine

## 2020-01-08 ENCOUNTER — Emergency Department (HOSPITAL_COMMUNITY): Payer: Medicaid Other

## 2020-01-08 ENCOUNTER — Other Ambulatory Visit: Payer: Self-pay

## 2020-01-08 DIAGNOSIS — J449 Chronic obstructive pulmonary disease, unspecified: Secondary | ICD-10-CM | POA: Diagnosis not present

## 2020-01-08 DIAGNOSIS — E119 Type 2 diabetes mellitus without complications: Secondary | ICD-10-CM | POA: Diagnosis not present

## 2020-01-08 DIAGNOSIS — F17210 Nicotine dependence, cigarettes, uncomplicated: Secondary | ICD-10-CM | POA: Diagnosis not present

## 2020-01-08 DIAGNOSIS — Z8616 Personal history of COVID-19: Secondary | ICD-10-CM | POA: Insufficient documentation

## 2020-01-08 DIAGNOSIS — I10 Essential (primary) hypertension: Secondary | ICD-10-CM | POA: Diagnosis not present

## 2020-01-08 DIAGNOSIS — R531 Weakness: Secondary | ICD-10-CM | POA: Diagnosis present

## 2020-01-08 DIAGNOSIS — R2243 Localized swelling, mass and lump, lower limb, bilateral: Secondary | ICD-10-CM | POA: Diagnosis not present

## 2020-01-08 MED ORDER — IPRATROPIUM-ALBUTEROL 0.5-2.5 (3) MG/3ML IN SOLN
3.0000 mL | RESPIRATORY_TRACT | Status: DC
  Administered 2020-01-09: 3 mL via RESPIRATORY_TRACT
  Filled 2020-01-08: qty 3

## 2020-01-08 MED ORDER — PREDNISONE 50 MG PO TABS
60.0000 mg | ORAL_TABLET | Freq: Once | ORAL | Status: AC
  Administered 2020-01-08: 60 mg via ORAL
  Filled 2020-01-08: qty 1

## 2020-01-08 NOTE — ED Triage Notes (Signed)
Pt c/o of generalized weakness for the past few days. Patient states he has fallen and hit his head on a chair but cant recall if he loss consciousness. Pt denies taking blood thinners.

## 2020-01-09 LAB — BASIC METABOLIC PANEL
Anion gap: 11 (ref 5–15)
BUN: 13 mg/dL (ref 6–20)
CO2: 30 mmol/L (ref 22–32)
Calcium: 8.7 mg/dL — ABNORMAL LOW (ref 8.9–10.3)
Chloride: 97 mmol/L — ABNORMAL LOW (ref 98–111)
Creatinine, Ser: 0.64 mg/dL (ref 0.61–1.24)
GFR, Estimated: >60 mL/min (ref >60)
Glucose, Bld: 122 mg/dL — ABNORMAL HIGH (ref 70–99)
Potassium: 3.6 mmol/L (ref 3.5–5.1)
Sodium: 138 mmol/L (ref 135–145)

## 2020-01-09 LAB — CBC
HCT: 45.5 % (ref 39.0–52.0)
Hemoglobin: 13.9 g/dL (ref 13.0–17.0)
MCH: 27.5 pg (ref 26.0–34.0)
MCHC: 30.5 g/dL (ref 30.0–36.0)
MCV: 90.1 fL (ref 80.0–100.0)
Platelets: 345 10*3/uL (ref 150–400)
RBC: 5.05 MIL/uL (ref 4.22–5.81)
RDW: 15.9 % — ABNORMAL HIGH (ref 11.5–15.5)
WBC: 8.7 10*3/uL (ref 4.0–10.5)
nRBC: 0 % (ref 0.0–0.2)

## 2020-01-09 LAB — TROPONIN I (HIGH SENSITIVITY)
Troponin I (High Sensitivity): 5 ng/L (ref <18)
Troponin I (High Sensitivity): 6 ng/L (ref <18)

## 2020-01-09 MED ORDER — PREDNISONE 20 MG PO TABS: ORAL_TABLET | ORAL | 0 refills | Status: DC
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
## 2020-01-28 ENCOUNTER — Ambulatory Visit (INDEPENDENT_AMBULATORY_CARE_PROVIDER_SITE_OTHER): Payer: Medicaid Other | Admitting: Podiatry

## 2020-01-28 ENCOUNTER — Other Ambulatory Visit: Payer: Self-pay | Admitting: Family Medicine

## 2020-01-28 ENCOUNTER — Other Ambulatory Visit: Payer: Self-pay

## 2020-01-28 DIAGNOSIS — I872 Venous insufficiency (chronic) (peripheral): Secondary | ICD-10-CM | POA: Diagnosis not present

## 2020-01-28 DIAGNOSIS — E1169 Type 2 diabetes mellitus with other specified complication: Secondary | ICD-10-CM

## 2020-01-28 DIAGNOSIS — E669 Obesity, unspecified: Secondary | ICD-10-CM | POA: Diagnosis not present

## 2020-01-28 DIAGNOSIS — L853 Xerosis cutis: Secondary | ICD-10-CM | POA: Diagnosis not present

## 2020-01-28 MED ORDER — AMMONIUM LACTATE 12 % EX CREA: TOPICAL_CREAM | CUTANEOUS | 0 refills | Status: DC | PRN

## 2020-01-31 ENCOUNTER — Ambulatory Visit (INDEPENDENT_AMBULATORY_CARE_PROVIDER_SITE_OTHER): Payer: Medicaid Other | Admitting: Pulmonary Disease

## 2020-01-31 ENCOUNTER — Other Ambulatory Visit: Payer: Self-pay

## 2020-01-31 ENCOUNTER — Encounter: Payer: Self-pay | Admitting: Pulmonary Disease

## 2020-01-31 VITALS — BP 136/80 | HR 80 | Temp 97.1°F | Ht 65.0 in | Wt 326.0 lb

## 2020-01-31 DIAGNOSIS — G4733 Obstructive sleep apnea (adult) (pediatric): Secondary | ICD-10-CM

## 2020-01-31 DIAGNOSIS — J9611 Chronic respiratory failure with hypoxia: Secondary | ICD-10-CM

## 2020-01-31 DIAGNOSIS — E662 Morbid (severe) obesity with alveolar hypoventilation: Secondary | ICD-10-CM

## 2020-01-31 DIAGNOSIS — J449 Chronic obstructive pulmonary disease, unspecified: Secondary | ICD-10-CM | POA: Diagnosis not present

## 2020-01-31 DIAGNOSIS — J9612 Chronic respiratory failure with hypercapnia: Secondary | ICD-10-CM
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
## 2020-01-31 NOTE — Patient Instructions (Signed)
Follow up in 6 months 
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
## 2020-02-09 ENCOUNTER — Observation Stay (HOSPITAL_COMMUNITY): Payer: Medicaid Other

## 2020-02-09 ENCOUNTER — Other Ambulatory Visit: Payer: Self-pay

## 2020-02-09 ENCOUNTER — Inpatient Hospital Stay (HOSPITAL_COMMUNITY)
Admission: EM | Admit: 2020-02-09 | Discharge: 2020-03-13 | DRG: 004 | Disposition: A | Discharge status: Home Health | Payer: Medicaid Other | Attending: Internal Medicine | Admitting: Internal Medicine

## 2020-02-09 ENCOUNTER — Emergency Department (HOSPITAL_COMMUNITY): Payer: Medicaid Other

## 2020-02-09 ENCOUNTER — Observation Stay (HOSPITAL_COMMUNITY): Payer: Medicaid Other | Admitting: Anesthesiology

## 2020-02-09 ENCOUNTER — Encounter (HOSPITAL_COMMUNITY): Payer: Self-pay | Admitting: Emergency Medicine

## 2020-02-09 ENCOUNTER — Inpatient Hospital Stay (HOSPITAL_COMMUNITY): Payer: Medicaid Other

## 2020-02-09 DIAGNOSIS — Z930 Tracheostomy status: Secondary | ICD-10-CM

## 2020-02-09 DIAGNOSIS — J9621 Acute and chronic respiratory failure with hypoxia: Secondary | ICD-10-CM | POA: Diagnosis not present

## 2020-02-09 DIAGNOSIS — J0410 Acute tracheitis without obstruction: Secondary | ICD-10-CM | POA: Diagnosis not present

## 2020-02-09 DIAGNOSIS — Z0189 Encounter for other specified special examinations: Secondary | ICD-10-CM

## 2020-02-09 DIAGNOSIS — J810 Acute pulmonary edema: Secondary | ICD-10-CM | POA: Diagnosis not present

## 2020-02-09 DIAGNOSIS — A498 Other bacterial infections of unspecified site: Secondary | ICD-10-CM | POA: Diagnosis not present

## 2020-02-09 DIAGNOSIS — Z978 Presence of other specified devices: Secondary | ICD-10-CM | POA: Diagnosis not present

## 2020-02-09 DIAGNOSIS — I10 Essential (primary) hypertension: Secondary | ICD-10-CM | POA: Diagnosis not present

## 2020-02-09 DIAGNOSIS — Z20822 Contact with and (suspected) exposure to covid-19: Secondary | ICD-10-CM | POA: Diagnosis present

## 2020-02-09 DIAGNOSIS — J9620 Acute and chronic respiratory failure, unspecified whether with hypoxia or hypercapnia: Secondary | ICD-10-CM | POA: Diagnosis present

## 2020-02-09 DIAGNOSIS — K219 Gastro-esophageal reflux disease without esophagitis: Secondary | ICD-10-CM | POA: Diagnosis present

## 2020-02-09 DIAGNOSIS — G4733 Obstructive sleep apnea (adult) (pediatric): Secondary | ICD-10-CM | POA: Diagnosis not present

## 2020-02-09 DIAGNOSIS — Z23 Encounter for immunization: Secondary | ICD-10-CM | POA: Diagnosis not present

## 2020-02-09 DIAGNOSIS — E872 Acidosis: Secondary | ICD-10-CM | POA: Diagnosis present

## 2020-02-09 DIAGNOSIS — J9602 Acute respiratory failure with hypercapnia: Secondary | ICD-10-CM | POA: Diagnosis present

## 2020-02-09 DIAGNOSIS — J9622 Acute and chronic respiratory failure with hypercapnia: Principal | ICD-10-CM | POA: Diagnosis present

## 2020-02-09 DIAGNOSIS — F419 Anxiety disorder, unspecified: Secondary | ICD-10-CM | POA: Diagnosis not present

## 2020-02-09 DIAGNOSIS — E662 Morbid (severe) obesity with alveolar hypoventilation: Secondary | ICD-10-CM | POA: Diagnosis present

## 2020-02-09 DIAGNOSIS — J398 Other specified diseases of upper respiratory tract: Secondary | ICD-10-CM

## 2020-02-09 DIAGNOSIS — W07XXXA Fall from chair, initial encounter: Secondary | ICD-10-CM | POA: Diagnosis present

## 2020-02-09 DIAGNOSIS — J9690 Respiratory failure, unspecified, unspecified whether with hypoxia or hypercapnia: Secondary | ICD-10-CM

## 2020-02-09 DIAGNOSIS — Z823 Family history of stroke: Secondary | ICD-10-CM

## 2020-02-09 DIAGNOSIS — J80 Acute respiratory distress syndrome: Secondary | ICD-10-CM

## 2020-02-09 DIAGNOSIS — D649 Anemia, unspecified: Secondary | ICD-10-CM | POA: Diagnosis present

## 2020-02-09 DIAGNOSIS — I110 Hypertensive heart disease with heart failure: Secondary | ICD-10-CM | POA: Diagnosis present

## 2020-02-09 DIAGNOSIS — Z9119 Patient's noncompliance with other medical treatment and regimen: Secondary | ICD-10-CM

## 2020-02-09 DIAGNOSIS — L8990 Pressure ulcer of unspecified site, unspecified stage: Secondary | ICD-10-CM | POA: Insufficient documentation

## 2020-02-09 DIAGNOSIS — L89893 Pressure ulcer of other site, stage 3: Secondary | ICD-10-CM | POA: Diagnosis present

## 2020-02-09 DIAGNOSIS — Y929 Unspecified place or not applicable: Secondary | ICD-10-CM

## 2020-02-09 DIAGNOSIS — N179 Acute kidney failure, unspecified: Secondary | ICD-10-CM | POA: Diagnosis present

## 2020-02-09 DIAGNOSIS — Z7984 Long term (current) use of oral hypoglycemic drugs: Secondary | ICD-10-CM

## 2020-02-09 DIAGNOSIS — I5033 Acute on chronic diastolic (congestive) heart failure: Secondary | ICD-10-CM | POA: Diagnosis not present

## 2020-02-09 DIAGNOSIS — Z6841 Body Mass Index (BMI) 40.0 and over, adult: Secondary | ICD-10-CM

## 2020-02-09 DIAGNOSIS — I5032 Chronic diastolic (congestive) heart failure: Secondary | ICD-10-CM | POA: Diagnosis present

## 2020-02-09 DIAGNOSIS — E876 Hypokalemia: Secondary | ICD-10-CM | POA: Diagnosis present

## 2020-02-09 DIAGNOSIS — R296 Repeated falls: Secondary | ICD-10-CM | POA: Diagnosis present

## 2020-02-09 DIAGNOSIS — B965 Pseudomonas (aeruginosa) (mallei) (pseudomallei) as the cause of diseases classified elsewhere: Secondary | ICD-10-CM | POA: Diagnosis not present

## 2020-02-09 DIAGNOSIS — B9562 Methicillin resistant Staphylococcus aureus infection as the cause of diseases classified elsewhere: Secondary | ICD-10-CM | POA: Diagnosis not present

## 2020-02-09 DIAGNOSIS — J40 Bronchitis, not specified as acute or chronic: Secondary | ICD-10-CM | POA: Diagnosis not present

## 2020-02-09 DIAGNOSIS — R52 Pain, unspecified: Secondary | ICD-10-CM | POA: Diagnosis not present

## 2020-02-09 DIAGNOSIS — R069 Unspecified abnormalities of breathing: Secondary | ICD-10-CM

## 2020-02-09 DIAGNOSIS — J9811 Atelectasis: Secondary | ICD-10-CM | POA: Diagnosis present

## 2020-02-09 DIAGNOSIS — J189 Pneumonia, unspecified organism: Secondary | ICD-10-CM | POA: Diagnosis not present

## 2020-02-09 DIAGNOSIS — Z79899 Other long term (current) drug therapy: Secondary | ICD-10-CM

## 2020-02-09 DIAGNOSIS — E785 Hyperlipidemia, unspecified: Secondary | ICD-10-CM | POA: Diagnosis present

## 2020-02-09 DIAGNOSIS — E119 Type 2 diabetes mellitus without complications: Secondary | ICD-10-CM | POA: Diagnosis present

## 2020-02-09 DIAGNOSIS — Z818 Family history of other mental and behavioral disorders: Secondary | ICD-10-CM

## 2020-02-09 DIAGNOSIS — F819 Developmental disorder of scholastic skills, unspecified: Secondary | ICD-10-CM | POA: Diagnosis present

## 2020-02-09 DIAGNOSIS — Z4659 Encounter for fitting and adjustment of other gastrointestinal appliance and device: Secondary | ICD-10-CM

## 2020-02-09 DIAGNOSIS — F32A Depression, unspecified: Secondary | ICD-10-CM | POA: Diagnosis present

## 2020-02-09 DIAGNOSIS — L309 Dermatitis, unspecified: Secondary | ICD-10-CM | POA: Diagnosis present

## 2020-02-09 DIAGNOSIS — I4891 Unspecified atrial fibrillation: Secondary | ICD-10-CM | POA: Diagnosis not present

## 2020-02-09 DIAGNOSIS — F411 Generalized anxiety disorder: Secondary | ICD-10-CM | POA: Diagnosis present

## 2020-02-09 DIAGNOSIS — J449 Chronic obstructive pulmonary disease, unspecified: Secondary | ICD-10-CM | POA: Diagnosis present

## 2020-02-09 DIAGNOSIS — Z9911 Dependence on respirator [ventilator] status: Secondary | ICD-10-CM | POA: Diagnosis not present

## 2020-02-09 DIAGNOSIS — Z9981 Dependence on supplemental oxygen: Secondary | ICD-10-CM

## 2020-02-09 DIAGNOSIS — G9341 Metabolic encephalopathy: Secondary | ICD-10-CM | POA: Diagnosis present

## 2020-02-09 DIAGNOSIS — Z8249 Family history of ischemic heart disease and other diseases of the circulatory system: Secondary | ICD-10-CM

## 2020-02-09 DIAGNOSIS — Z8701 Personal history of pneumonia (recurrent): Secondary | ICD-10-CM

## 2020-02-09 DIAGNOSIS — G9340 Encephalopathy, unspecified: Secondary | ICD-10-CM | POA: Diagnosis not present

## 2020-02-09 DIAGNOSIS — Z8616 Personal history of COVID-19: Secondary | ICD-10-CM | POA: Diagnosis not present

## 2020-02-09 DIAGNOSIS — R6889 Other general symptoms and signs: Secondary | ICD-10-CM | POA: Diagnosis not present

## 2020-02-09 DIAGNOSIS — I959 Hypotension, unspecified: Secondary | ICD-10-CM | POA: Diagnosis present

## 2020-02-09 DIAGNOSIS — R0600 Dyspnea, unspecified: Secondary | ICD-10-CM | POA: Diagnosis not present

## 2020-02-09 DIAGNOSIS — R0902 Hypoxemia: Secondary | ICD-10-CM

## 2020-02-09 DIAGNOSIS — R0689 Other abnormalities of breathing: Secondary | ICD-10-CM

## 2020-02-09 DIAGNOSIS — M79609 Pain in unspecified limb: Secondary | ICD-10-CM | POA: Diagnosis not present

## 2020-02-09 DIAGNOSIS — A4902 Methicillin resistant Staphylococcus aureus infection, unspecified site: Secondary | ICD-10-CM | POA: Diagnosis not present

## 2020-02-09 DIAGNOSIS — F17210 Nicotine dependence, cigarettes, uncomplicated: Secondary | ICD-10-CM | POA: Diagnosis present

## 2020-02-09 LAB — URINALYSIS, ROUTINE W REFLEX MICROSCOPIC
Bilirubin Urine: NEGATIVE
Glucose, UA: NEGATIVE mg/dL
Hgb urine dipstick: NEGATIVE
Ketones, ur: NEGATIVE mg/dL
Leukocytes,Ua: NEGATIVE
Nitrite: NEGATIVE
Protein, ur: >=300 mg/dL — AB
Specific Gravity, Urine: 1.026 (ref 1.005–1.030)
pH: 6 (ref 5.0–8.0)

## 2020-02-09 LAB — BLOOD GAS, ARTERIAL
Acid-Base Excess: 7.8 mmol/L — ABNORMAL HIGH (ref 0.0–2.0)
Acid-Base Excess: 8.3 mmol/L — ABNORMAL HIGH (ref 0.0–2.0)
Acid-Base Excess: 4 mmol/L — ABNORMAL HIGH (ref 0.0–2.0)
Acid-Base Excess: 3.4 mmol/L — ABNORMAL HIGH (ref 0.0–2.0)
Bicarbonate: 29.2 mmol/L — ABNORMAL HIGH (ref 20.0–28.0)
Bicarbonate: 29.2 mmol/L — ABNORMAL HIGH (ref 20.0–28.0)
Bicarbonate: 26 mmol/L (ref 20.0–28.0)
Bicarbonate: 26.3 mmol/L (ref 20.0–28.0)
Delivery systems: POSITIVE
Drawn by: 22179
Drawn by: 22223
FIO2: 32
FIO2: 30
FIO2: 100
FIO2: 100
MECHVT: 500 mL
O2 Saturation: 94.7 %
O2 Saturation: 89 %
O2 Saturation: 87 %
O2 Saturation: 91.2 %
PEEP: 10 cmH2O
Patient temperature: 36.7
Patient temperature: 36.7
Patient temperature: 37.1
Patient temperature: 37.4
RATE: 28 {breaths}/min
pCO2 arterial: 75.9 mmHg (ref 32.0–48.0)
pCO2 arterial: 83.2 mmHg (ref 32.0–48.0)
pCO2 arterial: 71.7 mmHg (ref 32.0–48.0)
pCO2 arterial: 55.9 mmHg — ABNORMAL HIGH (ref 32.0–48.0)
pH, Arterial: 7.277 — ABNORMAL LOW (ref 7.350–7.450)
pH, Arterial: 7.25 — ABNORMAL LOW (ref 7.350–7.450)
pH, Arterial: 7.255 — ABNORMAL LOW (ref 7.350–7.450)
pH, Arterial: 7.333 — ABNORMAL LOW (ref 7.350–7.450)
pO2, Arterial: 86.3 mmHg (ref 83.0–108.0)
pO2, Arterial: 68.7 mmHg — ABNORMAL LOW (ref 83.0–108.0)
pO2, Arterial: 62.5 mmHg — ABNORMAL LOW (ref 83.0–108.0)
pO2, Arterial: 67.4 mmHg — ABNORMAL LOW (ref 83.0–108.0)

## 2020-02-09 LAB — CBC WITH DIFFERENTIAL/PLATELET
Abs Immature Granulocytes: 0.03 10*3/uL (ref 0.00–0.07)
Basophils Absolute: 0 10*3/uL (ref 0.0–0.1)
Basophils Relative: 0 %
Eosinophils Absolute: 0.1 10*3/uL (ref 0.0–0.5)
Eosinophils Relative: 1 %
HCT: 42.1 % (ref 39.0–52.0)
Hemoglobin: 12.7 g/dL — ABNORMAL LOW (ref 13.0–17.0)
Immature Granulocytes: 0 %
Lymphocytes Relative: 19 %
Lymphs Abs: 1.4 10*3/uL (ref 0.7–4.0)
MCH: 27.5 pg (ref 26.0–34.0)
MCHC: 30.2 g/dL (ref 30.0–36.0)
MCV: 91.3 fL (ref 80.0–100.0)
Monocytes Absolute: 0.7 10*3/uL (ref 0.1–1.0)
Monocytes Relative: 9 %
Neutro Abs: 5.3 10*3/uL (ref 1.7–7.7)
Neutrophils Relative %: 71 %
Platelets: 305 10*3/uL (ref 150–400)
RBC: 4.61 MIL/uL (ref 4.22–5.81)
RDW: 15.8 % — ABNORMAL HIGH (ref 11.5–15.5)
WBC: 7.6 10*3/uL (ref 4.0–10.5)
nRBC: 0 % (ref 0.0–0.2)

## 2020-02-09 LAB — BASIC METABOLIC PANEL
Anion gap: 7 (ref 5–15)
BUN: 13 mg/dL (ref 6–20)
CO2: 33 mmol/L — ABNORMAL HIGH (ref 22–32)
Calcium: 9 mg/dL (ref 8.9–10.3)
Chloride: 99 mmol/L (ref 98–111)
Creatinine, Ser: 0.58 mg/dL — ABNORMAL LOW (ref 0.61–1.24)
GFR, Estimated: >60 mL/min (ref >60)
Glucose, Bld: 119 mg/dL — ABNORMAL HIGH (ref 70–99)
Potassium: 3.9 mmol/L (ref 3.5–5.1)
Sodium: 139 mmol/L (ref 135–145)

## 2020-02-09 LAB — BRAIN NATRIURETIC PEPTIDE: B Natriuretic Peptide: 17 pg/mL (ref 0.0–100.0)

## 2020-02-09 LAB — RESP PANEL BY RT-PCR (FLU A&B, COVID) ARPGX2
Influenza A by PCR: NEGATIVE
Influenza B by PCR: NEGATIVE
SARS Coronavirus 2 by RT PCR: NEGATIVE

## 2020-02-09 LAB — CBG MONITORING, ED
Glucose-Capillary: 98 mg/dL (ref 70–99)
Glucose-Capillary: 108 mg/dL — ABNORMAL HIGH (ref 70–99)

## 2020-02-09 LAB — TROPONIN I (HIGH SENSITIVITY): Troponin I (High Sensitivity): 8 ng/L (ref <18)

## 2020-02-09 MED ORDER — IPRATROPIUM-ALBUTEROL 0.5-2.5 (3) MG/3ML IN SOLN
3.0000 mL | Freq: Four times a day (QID) | RESPIRATORY_TRACT | Status: DC
  Administered 2020-02-10 – 2020-02-21 (×44): 3 mL via RESPIRATORY_TRACT
  Filled 2020-02-09 (×46): qty 3

## 2020-02-09 MED ORDER — HALOPERIDOL LACTATE 5 MG/ML IJ SOLN
INTRAMUSCULAR | Status: AC
  Administered 2020-02-09: 5 mg via INTRAMUSCULAR
  Filled 2020-02-09: qty 1

## 2020-02-09 MED ORDER — SODIUM CHLORIDE 0.9 % IV SOLN
2.0000 g | INTRAVENOUS | Status: DC
  Administered 2020-02-09 – 2020-02-10 (×2): 2 g via INTRAVENOUS
  Filled 2020-02-09 (×2): qty 20

## 2020-02-09 MED ORDER — IPRATROPIUM-ALBUTEROL 0.5-2.5 (3) MG/3ML IN SOLN: 3.0000 mL | RESPIRATORY_TRACT | Status: DC | PRN

## 2020-02-09 MED ORDER — SUCCINYLCHOLINE CHLORIDE 20 MG/ML IJ SOLN
INTRAMUSCULAR | Status: AC | PRN
  Administered 2020-02-09: 150 mg via INTRAVENOUS

## 2020-02-09 MED ORDER — MIDAZOLAM HCL 2 MG/2ML IJ SOLN: 2.0000 mg | INTRAMUSCULAR | Status: DC | PRN

## 2020-02-09 MED ORDER — ALBUTEROL SULFATE (2.5 MG/3ML) 0.083% IN NEBU
5.0000 mg | INHALATION_SOLUTION | Freq: Once | RESPIRATORY_TRACT | Status: DC
  Filled 2020-02-09 (×2): qty 6

## 2020-02-09 MED ORDER — SODIUM CHLORIDE 0.9 % IV SOLN
250.0000 mL | INTRAVENOUS | Status: DC
  Administered 2020-02-09 – 2020-03-02 (×5): 250 mL via INTRAVENOUS

## 2020-02-09 MED ORDER — MIDAZOLAM HCL 5 MG/5ML IJ SOLN: 5.0000 mg | Freq: Once | INTRAMUSCULAR | Status: AC

## 2020-02-09 MED ORDER — SUCCINYLCHOLINE CHLORIDE 20 MG/ML IJ SOLN: 200.0000 mg | Freq: Once | INTRAMUSCULAR | Status: DC

## 2020-02-09 MED ORDER — MIDAZOLAM HCL 5 MG/5ML IJ SOLN
INTRAMUSCULAR | Status: AC | PRN
  Administered 2020-02-09: 5 mg via INTRAVENOUS

## 2020-02-09 MED ORDER — MIDAZOLAM 50MG/50ML (1MG/ML) PREMIX INFUSION
INTRAVENOUS | Status: AC
  Filled 2020-02-09: qty 50

## 2020-02-09 MED ORDER — MIDAZOLAM 50MG/50ML (1MG/ML) PREMIX INFUSION
0.5000 mg/h | INTRAVENOUS | Status: DC
  Administered 2020-02-09: 1 mg/h via INTRAVENOUS

## 2020-02-09 MED ORDER — PANTOPRAZOLE SODIUM 40 MG IV SOLR
40.0000 mg | Freq: Every day | INTRAVENOUS | Status: DC
  Administered 2020-02-09: 40 mg via INTRAVENOUS
  Filled 2020-02-09: qty 40

## 2020-02-09 MED ORDER — IPRATROPIUM-ALBUTEROL 0.5-2.5 (3) MG/3ML IN SOLN
3.0000 mL | Freq: Four times a day (QID) | RESPIRATORY_TRACT | Status: DC
  Administered 2020-02-09 (×2): 3 mL via RESPIRATORY_TRACT

## 2020-02-09 MED ORDER — DOCUSATE SODIUM 50 MG/5ML PO LIQD: 100.0000 mg | Freq: Two times a day (BID) | ORAL | Status: DC

## 2020-02-09 MED ORDER — MIDAZOLAM HCL 5 MG/5ML IJ SOLN
INTRAMUSCULAR | Status: AC
  Administered 2020-02-09: 5 mg via INTRAVENOUS
  Filled 2020-02-09: qty 5

## 2020-02-09 MED ORDER — LORAZEPAM 2 MG/ML IJ SOLN
INTRAMUSCULAR | Status: AC
  Filled 2020-02-09: qty 1

## 2020-02-09 MED ORDER — ETOMIDATE 2 MG/ML IV SOLN
INTRAVENOUS | Status: AC | PRN
  Administered 2020-02-09: 20 mg via INTRAVENOUS

## 2020-02-09 MED ORDER — NOREPINEPHRINE 4 MG/250ML-% IV SOLN
INTRAVENOUS | Status: AC
  Filled 2020-02-09: qty 250

## 2020-02-09 MED ORDER — NOREPINEPHRINE 4 MG/250ML-% IV SOLN: 2.0000 ug/min | INTRAVENOUS | Status: DC

## 2020-02-09 MED ORDER — SUCCINYLCHOLINE CHLORIDE 20 MG/ML IJ SOLN
INTRAMUSCULAR | Status: AC | PRN
  Administered 2020-02-09: 50 mg via INTRAVENOUS

## 2020-02-09 MED ORDER — POLYETHYLENE GLYCOL 3350 17 G PO PACK: 17.0000 g | PACK | Freq: Every day | ORAL | Status: DC

## 2020-02-09 MED ORDER — FENTANYL CITRATE (PF) 100 MCG/2ML IJ SOLN
50.0000 ug | INTRAMUSCULAR | Status: DC | PRN
  Administered 2020-02-09 – 2020-02-10 (×3): 200 ug via INTRAVENOUS
  Filled 2020-02-09 (×3): qty 4

## 2020-02-09 MED ORDER — SODIUM CHLORIDE 0.9 % IV SOLN
500.0000 mg | INTRAVENOUS | Status: DC
  Administered 2020-02-09: 500 mg via INTRAVENOUS
  Filled 2020-02-09: qty 500

## 2020-02-09 MED ORDER — MIDAZOLAM HCL 5 MG/5ML IJ SOLN
INTRAMUSCULAR | Status: AC
  Filled 2020-02-09: qty 5

## 2020-02-09 MED ORDER — FENTANYL CITRATE (PF) 100 MCG/2ML IJ SOLN: 50.0000 ug | INTRAMUSCULAR | Status: DC | PRN

## 2020-02-09 MED ORDER — IPRATROPIUM BROMIDE 0.02 % IN SOLN
0.5000 mg | Freq: Once | RESPIRATORY_TRACT | Status: DC
  Filled 2020-02-09: qty 2.5

## 2020-02-09 MED ORDER — IPRATROPIUM-ALBUTEROL 0.5-2.5 (3) MG/3ML IN SOLN
3.0000 mL | Freq: Once | RESPIRATORY_TRACT | Status: AC
  Administered 2020-02-09: 3 mL via RESPIRATORY_TRACT
  Filled 2020-02-09: qty 3

## 2020-02-09 MED ORDER — METHYLPREDNISOLONE SODIUM SUCC 125 MG IJ SOLR
60.0000 mg | Freq: Two times a day (BID) | INTRAMUSCULAR | Status: DC
  Administered 2020-02-09: 60 mg via INTRAVENOUS
  Filled 2020-02-09: qty 2

## 2020-02-09 MED ORDER — SUCCINYLCHOLINE CHLORIDE 20 MG/ML IJ SOLN
INTRAMUSCULAR | Status: DC | PRN
  Administered 2020-02-09: 200 mg via INTRAVENOUS

## 2020-02-09 MED ORDER — PROPOFOL 1000 MG/100ML IV EMUL
5.0000 ug/kg/min | INTRAVENOUS | Status: DC
  Administered 2020-02-09: 50 ug/kg/min via INTRAVENOUS
  Administered 2020-02-09 – 2020-02-10 (×7): 80 ug/kg/min via INTRAVENOUS
  Filled 2020-02-09 (×4): qty 100
  Filled 2020-02-09: qty 200

## 2020-02-09 MED ORDER — HALOPERIDOL LACTATE 5 MG/ML IJ SOLN: 5.0000 mg | Freq: Once | INTRAMUSCULAR | Status: AC

## 2020-02-09 MED ORDER — ARFORMOTEROL TARTRATE 15 MCG/2ML IN NEBU
15.0000 ug | INHALATION_SOLUTION | Freq: Two times a day (BID) | RESPIRATORY_TRACT | Status: DC
  Administered 2020-02-10 – 2020-03-13 (×61): 15 ug via RESPIRATORY_TRACT
  Filled 2020-02-09 (×69): qty 2

## 2020-02-09 MED ORDER — MIDAZOLAM HCL (PF) 10 MG/2ML IJ SOLN
INTRAMUSCULAR | Status: AC
  Filled 2020-02-09: qty 2

## 2020-02-09 MED ORDER — PROPOFOL 1000 MG/100ML IV EMUL
INTRAVENOUS | Status: AC
  Filled 2020-02-09: qty 100

## 2020-02-09 MED ORDER — MIDAZOLAM HCL 5 MG/5ML IJ SOLN
INTRAMUSCULAR | Status: AC
  Administered 2020-02-09: 2 mg via INTRAVENOUS
  Filled 2020-02-09: qty 5

## 2020-02-09 MED ORDER — MIDAZOLAM HCL 5 MG/5ML IJ SOLN
5.0000 mg | Freq: Once | INTRAMUSCULAR | Status: AC
  Administered 2020-02-09: 5 mg via INTRAVENOUS

## 2020-02-09 NOTE — ED Notes (Signed)
Pts mother at bedside. Pts mother aware Pt is to be admitted and asked if she could say goodnight to Pt before going home. Pt currently intubated and sedated at this time.

## 2020-02-09 NOTE — ED Notes (Signed)
CRITICAL VALUE ALERT  Critical Value:  CO2:83.2  Date & Time Notied:  02/09/20 1718  Provider Notified: Denton Brick  Orders Received/Actions taken: Provider at bedside for attempted intubation

## 2020-02-09 NOTE — Anesthesia Postprocedure Evaluation (Signed)
Anesthesia Post Note  Patient: Shawn Ramsey  Procedure(s) Performed: AN AD HOC INTUBATION  Patient location during evaluation: Other Anesthesia Type: General Level of consciousness: sedated and patient remains intubated per anesthesia plan Pain management: pain level controlled Vital Signs Assessment: post-procedure vital signs reviewed and stable Respiratory status: patient on ventilator - see flowsheet for VS Cardiovascular status: blood pressure returned to baseline Postop Assessment: no headache Anesthetic complications: no   No complications documented.   Last Vitals:  Vitals:   02/09/20 1805 02/09/20 1810  BP: (!) 144/79 138/79  Pulse: (!) 103 98  Resp:    Temp:    SpO2: 100% 99%    Last Pain:  Vitals:   02/09/20 1642  TempSrc:   PainSc: 0-No pain                 Louann Sjogren

## 2020-02-09 NOTE — ED Notes (Signed)
Went in to transport pt and he was sleeping and unresponsive, VS stable, CBG normal. EDP and Attending notified, repeat ABG ordered, equipment at bedside for intubation.

## 2020-02-09 NOTE — ED Triage Notes (Signed)
Pt has had increasing shortness of breath sine the end of last week. Pt states he has not been utilizing his at home oxygen prescribed at 3L [place].  Pt also c/o of a fall backwards in a chair hitting his head. Pt is unsure if he lost consciousness.

## 2020-02-09 NOTE — ED Notes (Signed)
Date and time results received: 02/09/20 2123  Test: pCO2 Critical Value: 71.7  Name of Provider Notified: Su Ley, MD

## 2020-02-09 NOTE — Progress Notes (Signed)
PCCM Brief Progress Note  39yM with OHS/OSA with chronic hypoxic respiratory failure on 3L O2 not adherent to VPAP 24/19, asthma intubated for failed trial of NIV for acute on chronic hypercapnic respiratory failure. Case discussed with attending Dr. Denton Brick. There likely will not be beds at [facility] or [facility] this evening unfortunately.   Recommendations discussed: - ok to transfer to [facility] or [facility] when bed available - ensure ETT retracted 2cm following initial post intubation CXR - repeat vbg following intubation - would discontinue midazolam infusion. Start intermittent fentanyl and propofol for RASS -1, if need >3 pushes fentanyl in an hour, add fentanyl gtt. - diurese for net negative fluid balance - agree with ABX/cultures however RUL infiltrate may just be atelectasis in setting borderline mainstem intubation or chemical aspiration - continue bronchodilators - steroids reasonable however if he never was truly wheezy and it seems like this was instead decompensated OHS/OSA in setting volume overload, these could be discontinued  Letta Median, Pulmonary/Critical Care
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
## 2020-02-09 NOTE — ED Notes (Signed)
Date and time results received: 02/09/20 1402 (use smartphrase ".now" to insert current time)  Test: Co2 Critical Value: 75.9  Name of Provider Notified: Evalee Jefferson PA  Orders Received? Or Actions Taken?: n/a

## 2020-02-09 NOTE — ED Notes (Signed)
Pts Mother updated on Pt transfer and status via telephone.

## 2020-02-09 NOTE — ED Notes (Signed)
RT notified of orders, Phleb at bedside

## 2020-02-09 NOTE — Anesthesia Procedure Notes (Signed)
Procedure Name: Intubation Date/Time: 02/09/2020 7:17 PM Performed by: Louann Sjogren, MD Pre-anesthesia Checklist: Patient identified, Emergency Drugs available, Suction available, Patient being monitored and Timeout performed Patient Re-evaluated:Patient Re-evaluated prior to induction Oxygen Delivery Method: Ambu bag Induction Type: IV induction and Rapid sequence Ventilation: Mask ventilation with difficulty Laryngoscope Size: Glidescope and 3 Grade View: Grade II Tube type: Oral Tube size: 7.5 mm Number of attempts: 1 Airway Equipment and Method: Patient positioned with wedge pillow,  Rigid stylet and Video-laryngoscopy Placement Confirmation: ETT inserted through vocal cords under direct vision,  positive ETCO2 and breath sounds checked- equal and bilateral Secured at: 23 cm Tube secured with: Tape Dental Injury: Teeth and Oropharynx as per pre-operative assessment  Difficulty Due To: Difficulty was anticipated Future Recommendations: Recommend- induction with short-acting agent, and alternative techniques readily available Comments: Oropharynx full of blood due to previous attempts.
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
## 2020-02-09 NOTE — Progress Notes (Addendum)
Patient is a 39 year old male with a past medical history of COPD, OSA, OHS, morbid obesity BMI above 54, not compliant with BiPAP.  He is on BiPAP at night and 3 L of O2 at night also.  He follows with Dr. Halford Chessman. At the time of my evaluation patient is on BiPAP, and obtunded.  Patient's mother is at bedside and assist with the history.  Patient's mother reports that patient has not been compliant with BiPAP.  Over the past few days, patient has been dropping off to sleep multiple times during the day even while standing resulting in falls.  She reported increasing difficulty breathing also over the past few days.  No chest pains, no change in lower extremity swelling. On my exam patient was on BiPAP, limiting respiratory exam.  ED provider reports limited air movement on auscultation of chest but exam was difficult due to patient's habitus.  Bilateral lower extremities are large, but no appreciable pitting.  On arrival to the ED, per ED staff patient was initially sitting up at the side of the bed able to talk.  As patient's mental status changed and patient became obtunded, decision was made to intubate the patient.  ABG showed pH of 7.2, PCO2 of 75, repeat ABG after 1/2-hour on BiPAP without significant improvement, with PCO2 83.  After first attempt of intubation, patient became very agitated, requiring 10 mg of Versed and Haldol 5 mg IM.  Initial chest x-ray - without overt pulmonary edema or other acute abnormality.  Chest x-ray after intubation shows new left basilar consolidation.  Intermittent hypotension likely from sedative medications used, improved with IV fluid boluses-  ~1 L today given. Levofed ordered prophylactically via peripheral if needed.   Initial intubation attempted by ED provider, was unsuccessful, despite etomidate and succinylcholine, patient sustained some trauma resulting in bleeding.  Anesthesiology was consulted, with successful intubation.  Respiratory failure likely from OHS,  OSA non compliance with BiPAP and COPD exacerbation,   I talked to intensivist Dr. Verlee Monte, who graciously accepted patient.  Steroids initiated, antibiotics, tracheal aspirate collected, ABG postintubation done showed improvement PCO2 and PH, ET tube pulled back 2 cm with repeat chest x-ray showing ET tube now well above carina.  Versed discontinued, but patient currently maxed out on propofol.  Fentanyl as needed ordered.  Patient will be admitted to [facility].   E. Lorianne Malbrough. MD. TRH. 02/10/20 12:15 AM   LOS - No Charge.

## 2020-02-09 NOTE — ED Notes (Signed)
Report given to [facility] enroute to transport Pt to [facility] ICU.

## 2020-02-09 NOTE — ED Notes (Signed)
Pt becoming restless attempting to pull at lines and tubes.

## 2020-02-09 NOTE — Code Documentation (Signed)
Failt intubation x 1, unable to open mouth to get to air way, oral airway inserted and BVM support by RT. OG tube placed.Bipap replaced. VS stable.

## 2020-02-09 NOTE — ED Notes (Signed)
RT, anesthesia, and MD at bedside

## 2020-02-10 ENCOUNTER — Inpatient Hospital Stay (HOSPITAL_COMMUNITY): Payer: Medicaid Other

## 2020-02-10 DIAGNOSIS — N179 Acute kidney failure, unspecified: Secondary | ICD-10-CM

## 2020-02-10 DIAGNOSIS — J9621 Acute and chronic respiratory failure with hypoxia: Secondary | ICD-10-CM

## 2020-02-10 DIAGNOSIS — J189 Pneumonia, unspecified organism: Secondary | ICD-10-CM

## 2020-02-10 DIAGNOSIS — J9622 Acute and chronic respiratory failure with hypercapnia: Principal | ICD-10-CM

## 2020-02-10 DIAGNOSIS — R0600 Dyspnea, unspecified: Secondary | ICD-10-CM

## 2020-02-10 LAB — POCT I-STAT 7, (LYTES, BLD GAS, ICA,H+H)
Acid-Base Excess: 4 mmol/L — ABNORMAL HIGH (ref 0.0–2.0)
Acid-Base Excess: 4 mmol/L — ABNORMAL HIGH (ref 0.0–2.0)
Acid-Base Excess: 4 mmol/L — ABNORMAL HIGH (ref 0.0–2.0)
Bicarbonate: 32.2 mmol/L — ABNORMAL HIGH (ref 20.0–28.0)
Bicarbonate: 30.7 mmol/L — ABNORMAL HIGH (ref 20.0–28.0)
Bicarbonate: 30.6 mmol/L — ABNORMAL HIGH (ref 20.0–28.0)
Calcium, Ion: 1.15 mmol/L (ref 1.15–1.40)
Calcium, Ion: 1.14 mmol/L — ABNORMAL LOW (ref 1.15–1.40)
Calcium, Ion: 1.14 mmol/L — ABNORMAL LOW (ref 1.15–1.40)
HCT: 43 % (ref 39.0–52.0)
HCT: 37 % — ABNORMAL LOW (ref 39.0–52.0)
HCT: 37 % — ABNORMAL LOW (ref 39.0–52.0)
Hemoglobin: 14.6 g/dL (ref 13.0–17.0)
Hemoglobin: 12.6 g/dL — ABNORMAL LOW (ref 13.0–17.0)
Hemoglobin: 12.6 g/dL — ABNORMAL LOW (ref 13.0–17.0)
O2 Saturation: 92 %
O2 Saturation: 85 %
O2 Saturation: 87 %
Patient temperature: 37.6
Patient temperature: 37.1
Patient temperature: 99.3
Potassium: 3.6 mmol/L (ref 3.5–5.1)
Potassium: 3.6 mmol/L (ref 3.5–5.1)
Potassium: 3.6 mmol/L (ref 3.5–5.1)
Sodium: 141 mmol/L (ref 135–145)
Sodium: 139 mmol/L (ref 135–145)
Sodium: 140 mmol/L (ref 135–145)
TCO2: 34 mmol/L — ABNORMAL HIGH (ref 22–32)
TCO2: 32 mmol/L (ref 22–32)
TCO2: 32 mmol/L (ref 22–32)
pCO2 arterial: 65.9 mmHg (ref 32.0–48.0)
pCO2 arterial: 53.9 mmHg — ABNORMAL HIGH (ref 32.0–48.0)
pCO2 arterial: 55.1 mmHg — ABNORMAL HIGH (ref 32.0–48.0)
pH, Arterial: 7.3 — ABNORMAL LOW (ref 7.350–7.450)
pH, Arterial: 7.364 (ref 7.350–7.450)
pH, Arterial: 7.354 (ref 7.350–7.450)
pO2, Arterial: 74 mmHg — ABNORMAL LOW (ref 83.0–108.0)
pO2, Arterial: 53 mmHg — ABNORMAL LOW (ref 83.0–108.0)
pO2, Arterial: 57 mmHg — ABNORMAL LOW (ref 83.0–108.0)

## 2020-02-10 LAB — HIV ANTIBODY (ROUTINE TESTING W REFLEX): HIV Screen 4th Generation wRfx: NONREACTIVE

## 2020-02-10 LAB — BASIC METABOLIC PANEL
Anion gap: 15 (ref 5–15)
Anion gap: 13 (ref 5–15)
BUN: 12 mg/dL (ref 6–20)
BUN: 14 mg/dL (ref 6–20)
CO2: 26 mmol/L (ref 22–32)
CO2: 28 mmol/L (ref 22–32)
Calcium: 8.8 mg/dL — ABNORMAL LOW (ref 8.9–10.3)
Calcium: 8.6 mg/dL — ABNORMAL LOW (ref 8.9–10.3)
Chloride: 99 mmol/L (ref 98–111)
Chloride: 101 mmol/L (ref 98–111)
Creatinine, Ser: 0.98 mg/dL (ref 0.61–1.24)
Creatinine, Ser: 0.79 mg/dL (ref 0.61–1.24)
GFR, Estimated: >60 mL/min (ref >60)
GFR, Estimated: >60 mL/min (ref >60)
Glucose, Bld: 155 mg/dL — ABNORMAL HIGH (ref 70–99)
Glucose, Bld: 138 mg/dL — ABNORMAL HIGH (ref 70–99)
Potassium: 4 mmol/L (ref 3.5–5.1)
Potassium: 3.9 mmol/L (ref 3.5–5.1)
Sodium: 140 mmol/L (ref 135–145)
Sodium: 142 mmol/L (ref 135–145)

## 2020-02-10 LAB — GLUCOSE, CAPILLARY
Glucose-Capillary: 124 mg/dL — ABNORMAL HIGH (ref 70–99)
Glucose-Capillary: 126 mg/dL — ABNORMAL HIGH (ref 70–99)
Glucose-Capillary: 131 mg/dL — ABNORMAL HIGH (ref 70–99)
Glucose-Capillary: 138 mg/dL — ABNORMAL HIGH (ref 70–99)
Glucose-Capillary: 154 mg/dL — ABNORMAL HIGH (ref 70–99)
Glucose-Capillary: 165 mg/dL — ABNORMAL HIGH (ref 70–99)
Glucose-Capillary: 173 mg/dL — ABNORMAL HIGH (ref 70–99)

## 2020-02-10 LAB — ECHOCARDIOGRAM COMPLETE
Height: 65 in
S' Lateral: 3.9 cm
Weight: 6088.22 oz

## 2020-02-10 LAB — CBC
HCT: 39.5 % (ref 39.0–52.0)
Hemoglobin: 11.9 g/dL — ABNORMAL LOW (ref 13.0–17.0)
MCH: 27.2 pg (ref 26.0–34.0)
MCHC: 30.1 g/dL (ref 30.0–36.0)
MCV: 90.4 fL (ref 80.0–100.0)
Platelets: 305 10*3/uL (ref 150–400)
RBC: 4.37 MIL/uL (ref 4.22–5.81)
RDW: 16 % — ABNORMAL HIGH (ref 11.5–15.5)
WBC: 16.2 10*3/uL — ABNORMAL HIGH (ref 4.0–10.5)
nRBC: 0 % (ref 0.0–0.2)

## 2020-02-10 LAB — MAGNESIUM
Magnesium: 1.8 mg/dL (ref 1.7–2.4)
Magnesium: 1.6 mg/dL — ABNORMAL LOW (ref 1.7–2.4)
Magnesium: 1.9 mg/dL (ref 1.7–2.4)

## 2020-02-10 LAB — PHOSPHORUS
Phosphorus: 2.5 mg/dL (ref 2.5–4.6)
Phosphorus: 4 mg/dL (ref 2.5–4.6)

## 2020-02-10 LAB — TSH: TSH: 1.079 u[IU]/mL (ref 0.350–4.500)

## 2020-02-10 LAB — MRSA PCR SCREENING: MRSA by PCR: POSITIVE — AB

## 2020-02-10 LAB — AMMONIA: Ammonia: 21 umol/L (ref 9–35)

## 2020-02-10 MED ORDER — VITAL HIGH PROTEIN PO LIQD
1000.0000 mL | ORAL | Status: DC
  Administered 2020-02-11 – 2020-02-18 (×6): 1000 mL
  Filled 2020-02-10 (×7): qty 1000

## 2020-02-10 MED ORDER — PROPOFOL 1000 MG/100ML IV EMUL
5.0000 ug/kg/min | INTRAVENOUS | Status: DC
  Administered 2020-02-10: 20 ug/kg/min via INTRAVENOUS
  Administered 2020-02-10 (×2): 50 ug/kg/min via INTRAVENOUS
  Administered 2020-02-10: 15 ug/kg/min via INTRAVENOUS
  Administered 2020-02-10: 40 ug/kg/min via INTRAVENOUS
  Administered 2020-02-11: 15 ug/kg/min via INTRAVENOUS
  Filled 2020-02-10 (×2): qty 100
  Filled 2020-02-10: qty 200
  Filled 2020-02-10: qty 100

## 2020-02-10 MED ORDER — FUROSEMIDE 10 MG/ML IJ SOLN
40.0000 mg | Freq: Every day | INTRAMUSCULAR | Status: DC
  Administered 2020-02-10: 40 mg via INTRAVENOUS
  Filled 2020-02-10: qty 4

## 2020-02-10 MED ORDER — DOCUSATE SODIUM 50 MG/5ML PO LIQD: 100.0000 mg | Freq: Two times a day (BID) | ORAL | Status: DC | PRN

## 2020-02-10 MED ORDER — FAMOTIDINE 20 MG PO TABS
20.0000 mg | ORAL_TABLET | Freq: Two times a day (BID) | ORAL | Status: DC
  Administered 2020-02-10 – 2020-02-23 (×29): 20 mg
  Filled 2020-02-10 (×28): qty 1

## 2020-02-10 MED ORDER — FENTANYL 2500MCG IN NS 250ML (10MCG/ML) PREMIX INFUSION
50.0000 ug/h | INTRAVENOUS | Status: DC
  Administered 2020-02-10: 50 ug/h via INTRAVENOUS
  Administered 2020-02-11: 75 ug/h via INTRAVENOUS
  Administered 2020-02-12: 50 ug/h via INTRAVENOUS
  Administered 2020-02-12: 150 ug/h via INTRAVENOUS
  Administered 2020-02-13 (×2): 200 ug/h via INTRAVENOUS
  Filled 2020-02-10 (×7): qty 250

## 2020-02-10 MED ORDER — FUROSEMIDE 10 MG/ML IJ SOLN
60.0000 mg | Freq: Once | INTRAMUSCULAR | Status: AC
  Administered 2020-02-10: 60 mg via INTRAVENOUS
  Filled 2020-02-10: qty 6

## 2020-02-10 MED ORDER — CHLORHEXIDINE GLUCONATE 0.12% ORAL RINSE (MEDLINE KIT)
15.0000 mL | Freq: Two times a day (BID) | OROMUCOSAL | Status: DC
  Administered 2020-02-10 – 2020-02-22 (×27): 15 mL via OROMUCOSAL

## 2020-02-10 MED ORDER — ORAL CARE MOUTH RINSE
15.0000 mL | OROMUCOSAL | Status: DC
  Administered 2020-02-10 – 2020-02-23 (×128): 15 mL via OROMUCOSAL

## 2020-02-10 MED ORDER — POLYETHYLENE GLYCOL 3350 17 G PO PACK
17.0000 g | PACK | Freq: Every day | ORAL | Status: DC
  Administered 2020-02-10 – 2020-02-22 (×12): 17 g
  Filled 2020-02-10 (×13): qty 1

## 2020-02-10 MED ORDER — INSULIN ASPART 100 UNIT/ML ~~LOC~~ SOLN
0.0000 [IU] | SUBCUTANEOUS | Status: DC
  Administered 2020-02-10 (×2): 3 [IU] via SUBCUTANEOUS
  Administered 2020-02-10: 4 [IU] via SUBCUTANEOUS
  Administered 2020-02-10 (×2): 3 [IU] via SUBCUTANEOUS
  Administered 2020-02-10 (×2): 4 [IU] via SUBCUTANEOUS
  Administered 2020-02-11 – 2020-02-13 (×6): 3 [IU] via SUBCUTANEOUS
  Administered 2020-02-14: 4 [IU] via SUBCUTANEOUS
  Administered 2020-02-14 – 2020-02-15 (×5): 3 [IU] via SUBCUTANEOUS
  Administered 2020-02-15: 4 [IU] via SUBCUTANEOUS
  Administered 2020-02-15 – 2020-02-17 (×10): 3 [IU] via SUBCUTANEOUS
  Administered 2020-02-17 (×2): 4 [IU] via SUBCUTANEOUS
  Administered 2020-02-18 – 2020-02-20 (×7): 3 [IU] via SUBCUTANEOUS
  Administered 2020-02-20: 4 [IU] via SUBCUTANEOUS
  Administered 2020-02-20 – 2020-02-22 (×4): 3 [IU] via SUBCUTANEOUS

## 2020-02-10 MED ORDER — FENTANYL CITRATE (PF) 100 MCG/2ML IJ SOLN
50.0000 ug | Freq: Once | INTRAMUSCULAR | Status: AC
  Administered 2020-02-15: 50 ug via INTRAVENOUS
  Filled 2020-02-10: qty 2

## 2020-02-10 MED ORDER — ENOXAPARIN SODIUM 40 MG/0.4ML ~~LOC~~ SOLN: 40.0000 mg | Freq: Every day | SUBCUTANEOUS | Status: DC

## 2020-02-10 MED ORDER — DOCUSATE SODIUM 50 MG/5ML PO LIQD
100.0000 mg | Freq: Two times a day (BID) | ORAL | Status: DC
  Administered 2020-02-10 – 2020-02-22 (×23): 100 mg
  Filled 2020-02-10 (×24): qty 10

## 2020-02-10 MED ORDER — CHLORHEXIDINE GLUCONATE CLOTH 2 % EX PADS
6.0000 | MEDICATED_PAD | Freq: Every day | CUTANEOUS | Status: DC
  Administered 2020-02-13: 6 via TOPICAL

## 2020-02-10 MED ORDER — MUPIROCIN 2 % EX OINT
1.0000 "application " | TOPICAL_OINTMENT | Freq: Two times a day (BID) | CUTANEOUS | Status: AC
  Administered 2020-02-10 – 2020-02-14 (×10): 1 via NASAL
  Filled 2020-02-10 (×2): qty 22

## 2020-02-10 MED ORDER — CHLORHEXIDINE GLUCONATE CLOTH 2 % EX PADS
6.0000 | MEDICATED_PAD | Freq: Every day | CUTANEOUS | Status: DC
  Administered 2020-02-10 – 2020-03-13 (×34): 6 via TOPICAL

## 2020-02-10 MED ORDER — ATORVASTATIN CALCIUM 40 MG PO TABS
40.0000 mg | ORAL_TABLET | Freq: Every day | ORAL | Status: DC
  Administered 2020-02-10 – 2020-02-23 (×14): 40 mg
  Filled 2020-02-10 (×14): qty 1

## 2020-02-10 MED ORDER — SODIUM CHLORIDE 0.9 % IV SOLN
500.0000 mg | INTRAVENOUS | Status: DC
  Administered 2020-02-10: 500 mg via INTRAVENOUS
  Filled 2020-02-10 (×2): qty 500

## 2020-02-10 MED ORDER — DOCUSATE SODIUM 100 MG PO CAPS: 100.0000 mg | ORAL_CAPSULE | Freq: Two times a day (BID) | ORAL | Status: DC | PRN

## 2020-02-10 MED ORDER — ENOXAPARIN SODIUM 100 MG/ML ~~LOC~~ SOLN
0.5000 mg/kg | Freq: Every day | SUBCUTANEOUS | Status: DC
  Administered 2020-02-10 – 2020-02-13 (×4): 87.5 mg via SUBCUTANEOUS
  Filled 2020-02-10 (×5): qty 0.88

## 2020-02-10 MED ORDER — POLYETHYLENE GLYCOL 3350 17 G PO PACK: 17.0000 g | PACK | Freq: Every day | ORAL | Status: DC | PRN

## 2020-02-10 MED ORDER — PROSOURCE TF PO LIQD
45.0000 mL | Freq: Two times a day (BID) | ORAL | Status: DC
  Administered 2020-02-10 – 2020-02-19 (×19): 45 mL
  Filled 2020-02-10 (×18): qty 45

## 2020-02-10 MED ORDER — VITAL HIGH PROTEIN PO LIQD
1000.0000 mL | ORAL | Status: DC
  Administered 2020-02-10: 1000 mL

## 2020-02-10 MED ORDER — FENTANYL BOLUS VIA INFUSION
50.0000 ug | INTRAVENOUS | Status: DC | PRN
  Administered 2020-02-10 – 2020-02-12 (×4): 50 ug via INTRAVENOUS
  Filled 2020-02-10: qty 50

## 2020-02-10 NOTE — Progress Notes (Signed)
[facility]Link Physician-Brief Progress Note Patient Name: Shawn Ramsey DOB: April 04, 1980 MRN: [id number]   Date of Service  02/10/2020  HPI/Events of Note  ABG on 100%/PRVC 65.9/TV 490/P 10 = 7.30/65.9/74.  eICU Interventions  Plan: 1. Increase PRVC rate to 24. 2. Repeat ABG at 5 AM.     Intervention Category Major Interventions: Respiratory failure - evaluation and management;Acid-Base disturbance - evaluation and management  Phebe Dettmer [place] 02/10/2020, 2:44 AM

## 2020-02-10 NOTE — Progress Notes (Signed)
[facility] Physician-Brief Progress Note Patient Name: Shawn Ramsey DOB: [birth year] MRN: [id number]   Date of Service  02/10/2020  HPI/Events of Note  ABG on 100%/PRVC 24/TV 460/P 10 = 7.36/53.9/53   eICU Interventions  Plan:  1. Increase PEEP to 12. 2. Repeat ABG at 7:30 AM.     Intervention Category Major Interventions: Respiratory failure - evaluation and management  Minola Guin Eugene 02/10/2020, 5:56 AM

## 2020-02-10 NOTE — Progress Notes (Signed)
ABG collected and Elink notified.
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
## 2020-02-10 NOTE — Progress Notes (Signed)
Wasted 50 mL of Versed in stericycle. Aquilla Solian, RN was witness.

## 2020-02-10 NOTE — Progress Notes (Signed)
  Echocardiogram 2D Echocardiogram has been performed.  Shawn Ramsey 02/10/2020, 10:30 AM

## 2020-02-10 NOTE — Progress Notes (Signed)
Initial Nutrition Assessment  DOCUMENTATION CODES:   Morbid obesity  INTERVENTION:   Initiate tube feeding via OG tube: Vital High Protein at 60 ml/h (1440 ml per day) Prosource TF 45 ml BID  Provides 1520 kcal, 148 gm protein, 1204 ml free water daily  NUTRITION DIAGNOSIS:   Inadequate oral intake related to inability to eat as evidenced by NPO status.  GOAL:   Provide needs based on ASPEN/SCCM guidelines  MONITOR:   Vent status,TF tolerance,Labs  REASON FOR ASSESSMENT:   Ventilator,Consult Enteral/tube feeding initiation and management  ASSESSMENT:   39 yo male admitted with acute on chronic hypercapnic respiratory failure. Required intubation in the ED. PMH includes OHS/OSA (home oxygen 3 L), DM, HF, asthma, COPD, cognitive developmental delay, psychotic disorder.   Discussed patient in ICU rounds and with RN today. Received MD Consult for TF initiation and management. OG tube in place. Plans to wean and discontinue propofol today and fentanyl is being started.   Patient is currently intubated on ventilator support MV: 11.7 L/min Temp (24hrs), Avg:99 F (37.2 C), Min:98 F (36.7 C), Max:99.68 F (37.6 C)  Propofol: 51 ml/hr, being discontinued today  Labs reviewed. Mag 1.6 CBG: 173-165  Medications reviewed and include Colace, Lasix, Novolog SSI, Miralax, propofol.  Over the past year, weight has fluctuated up and down, likely related to volume status with hx of HF. Patient currently has deep pitting edema.   NUTRITION - FOCUSED PHYSICAL EXAM:  Flowsheet Row Most Recent Value  Orbital Region No depletion  Upper Arm Region No depletion  Thoracic and Lumbar Region No depletion  Buccal Region No depletion  Temple Region No depletion  Clavicle Bone Region No depletion  Clavicle and Acromion Bone Region No depletion  Scapular Bone Region Unable to assess  Dorsal Hand No depletion  Patellar Region No depletion  Anterior Thigh Region No depletion   Posterior Calf Region No depletion  Edema (RD Assessment) Severe  Hair Reviewed  Eyes Unable to assess  Mouth Unable to assess  Skin Reviewed  Nails Reviewed       Diet Order:   Diet Order            Diet NPO time specified  Diet effective now                 EDUCATION NEEDS:   Not appropriate for education at this time  Skin:  Skin Assessment: Reviewed RN Assessment  Last BM:  no BM documented  Height:   Ht Readings from Last 1 Encounters:  02/10/20 5\' 5"  (1.651 m)    Weight:   Wt Readings from Last 1 Encounters:  02/10/20 (!) 172.6 kg    Ideal Body Weight:  61.8 kg  BMI:  Body mass index is 63.32 kg/m.  Estimated Nutritional Needs:   Kcal:  0263-7858  Protein:  135-155 gm  Fluid:  1.8 L    Lucas Mallow, RD, LDN, CNSC Please refer to Amion for contact information.

## 2020-02-11 DIAGNOSIS — G4733 Obstructive sleep apnea (adult) (pediatric): Secondary | ICD-10-CM

## 2020-02-11 DIAGNOSIS — J810 Acute pulmonary edema: Secondary | ICD-10-CM

## 2020-02-11 LAB — BASIC METABOLIC PANEL
Anion gap: 11 (ref 5–15)
BUN: 17 mg/dL (ref 6–20)
CO2: 30 mmol/L (ref 22–32)
Calcium: 8.5 mg/dL — ABNORMAL LOW (ref 8.9–10.3)
Chloride: 103 mmol/L (ref 98–111)
Creatinine, Ser: 0.71 mg/dL (ref 0.61–1.24)
GFR, Estimated: >60 mL/min (ref >60)
Glucose, Bld: 125 mg/dL — ABNORMAL HIGH (ref 70–99)
Potassium: 3.4 mmol/L — ABNORMAL LOW (ref 3.5–5.1)
Sodium: 144 mmol/L (ref 135–145)

## 2020-02-11 LAB — PHOSPHORUS
Phosphorus: 4.1 mg/dL (ref 2.5–4.6)
Phosphorus: 4.1 mg/dL (ref 2.5–4.6)

## 2020-02-11 LAB — CBC
HCT: 35.2 % — ABNORMAL LOW (ref 39.0–52.0)
Hemoglobin: 11.1 g/dL — ABNORMAL LOW (ref 13.0–17.0)
MCH: 27.8 pg (ref 26.0–34.0)
MCHC: 31.5 g/dL (ref 30.0–36.0)
MCV: 88 fL (ref 80.0–100.0)
Platelets: 288 10*3/uL (ref 150–400)
RBC: 4 MIL/uL — ABNORMAL LOW (ref 4.22–5.81)
RDW: 16.1 % — ABNORMAL HIGH (ref 11.5–15.5)
WBC: 10.4 10*3/uL (ref 4.0–10.5)
nRBC: 0 % (ref 0.0–0.2)

## 2020-02-11 LAB — GLUCOSE, CAPILLARY
Glucose-Capillary: 129 mg/dL — ABNORMAL HIGH (ref 70–99)
Glucose-Capillary: 113 mg/dL — ABNORMAL HIGH (ref 70–99)
Glucose-Capillary: 132 mg/dL — ABNORMAL HIGH (ref 70–99)
Glucose-Capillary: 117 mg/dL — ABNORMAL HIGH (ref 70–99)
Glucose-Capillary: 113 mg/dL — ABNORMAL HIGH (ref 70–99)
Glucose-Capillary: 109 mg/dL — ABNORMAL HIGH (ref 70–99)

## 2020-02-11 LAB — MAGNESIUM
Magnesium: 2 mg/dL (ref 1.7–2.4)
Magnesium: 1.9 mg/dL (ref 1.7–2.4)

## 2020-02-11 MED ORDER — POTASSIUM CHLORIDE 20 MEQ PO PACK
40.0000 meq | PACK | Freq: Once | ORAL | Status: AC
  Administered 2020-02-11: 40 meq
  Filled 2020-02-11: qty 2

## 2020-02-11 MED ORDER — HYDROCHLOROTHIAZIDE 25 MG PO TABS
12.5000 mg | ORAL_TABLET | Freq: Every day | ORAL | Status: DC
  Administered 2020-02-11 – 2020-02-15 (×5): 12.5 mg
  Filled 2020-02-11 (×6): qty 0.5

## 2020-02-11 MED ORDER — FUROSEMIDE 10 MG/ML IJ SOLN
40.0000 mg | Freq: Two times a day (BID) | INTRAMUSCULAR | Status: DC
  Administered 2020-02-11 – 2020-02-16 (×12): 40 mg via INTRAVENOUS
  Filled 2020-02-11 (×12): qty 4

## 2020-02-11 MED ORDER — SCOPOLAMINE 1 MG/3DAYS TD PT72
1.0000 | MEDICATED_PATCH | TRANSDERMAL | Status: DC
  Administered 2020-02-11 – 2020-02-20 (×4): 1.5 mg via TRANSDERMAL
  Filled 2020-02-11 (×4): qty 1

## 2020-02-11 NOTE — Progress Notes (Signed)
Pharmacy Electrolyte Replacement  Recent Labs:  Recent Labs    02/11/20 0538  K 3.4*  MG 2.0  PHOS 4.1  CREATININE 0.71    Low Critical Values (K </= 2.5, Phos </= 1, Mg </= 1) Present: None  Plan:  KCl 40 mEq PT x 1  Barth Kirks, PharmD, BCPS, BCCCP Clinical Pharmacist [phone]  Please check AMION for all [facility] numbers  02/11/2020 7:20 AM
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
## 2020-02-12 DIAGNOSIS — J9621 Acute and chronic respiratory failure with hypoxia: Secondary | ICD-10-CM | POA: Diagnosis not present

## 2020-02-12 DIAGNOSIS — I10 Essential (primary) hypertension: Secondary | ICD-10-CM | POA: Diagnosis not present

## 2020-02-12 DIAGNOSIS — I5032 Chronic diastolic (congestive) heart failure: Secondary | ICD-10-CM

## 2020-02-12 DIAGNOSIS — N179 Acute kidney failure, unspecified: Secondary | ICD-10-CM | POA: Diagnosis not present

## 2020-02-12 LAB — BASIC METABOLIC PANEL
Anion gap: 12 (ref 5–15)
BUN: 13 mg/dL (ref 6–20)
CO2: 33 mmol/L — ABNORMAL HIGH (ref 22–32)
Calcium: 8.7 mg/dL — ABNORMAL LOW (ref 8.9–10.3)
Chloride: 100 mmol/L (ref 98–111)
Creatinine, Ser: 0.68 mg/dL (ref 0.61–1.24)
GFR, Estimated: >60 mL/min (ref >60)
Glucose, Bld: 113 mg/dL — ABNORMAL HIGH (ref 70–99)
Potassium: 3.7 mmol/L (ref 3.5–5.1)
Sodium: 145 mmol/L (ref 135–145)

## 2020-02-12 LAB — CBC
HCT: 38.1 % — ABNORMAL LOW (ref 39.0–52.0)
Hemoglobin: 12 g/dL — ABNORMAL LOW (ref 13.0–17.0)
MCH: 28.3 pg (ref 26.0–34.0)
MCHC: 31.5 g/dL (ref 30.0–36.0)
MCV: 89.9 fL (ref 80.0–100.0)
Platelets: 292 10*3/uL (ref 150–400)
RBC: 4.24 MIL/uL (ref 4.22–5.81)
RDW: 16.7 % — ABNORMAL HIGH (ref 11.5–15.5)
WBC: 12.1 10*3/uL — ABNORMAL HIGH (ref 4.0–10.5)
nRBC: 0 % (ref 0.0–0.2)

## 2020-02-12 LAB — POCT I-STAT 7, (LYTES, BLD GAS, ICA,H+H)
Acid-Base Excess: 14 mmol/L — ABNORMAL HIGH (ref 0.0–2.0)
Bicarbonate: 42 mmol/L — ABNORMAL HIGH (ref 20.0–28.0)
Calcium, Ion: 1.16 mmol/L (ref 1.15–1.40)
HCT: 38 % — ABNORMAL LOW (ref 39.0–52.0)
Hemoglobin: 12.9 g/dL — ABNORMAL LOW (ref 13.0–17.0)
O2 Saturation: 97 %
Patient temperature: 98.7
Potassium: 3.5 mmol/L (ref 3.5–5.1)
Sodium: 145 mmol/L (ref 135–145)
TCO2: 44 mmol/L — ABNORMAL HIGH (ref 22–32)
pCO2 arterial: 70.3 mmHg (ref 32.0–48.0)
pH, Arterial: 7.385 (ref 7.350–7.450)
pO2, Arterial: 95 mmHg (ref 83.0–108.0)

## 2020-02-12 LAB — GLUCOSE, CAPILLARY
Glucose-Capillary: 137 mg/dL — ABNORMAL HIGH (ref 70–99)
Glucose-Capillary: 103 mg/dL — ABNORMAL HIGH (ref 70–99)
Glucose-Capillary: 114 mg/dL — ABNORMAL HIGH (ref 70–99)
Glucose-Capillary: 128 mg/dL — ABNORMAL HIGH (ref 70–99)
Glucose-Capillary: 97 mg/dL (ref 70–99)
Glucose-Capillary: 112 mg/dL — ABNORMAL HIGH (ref 70–99)

## 2020-02-12 LAB — CULTURE, RESPIRATORY W GRAM STAIN: Culture: NORMAL

## 2020-02-12 MED ORDER — POTASSIUM CHLORIDE 20 MEQ PO PACK
40.0000 meq | PACK | Freq: Once | ORAL | Status: AC
  Administered 2020-02-12: 40 meq
  Filled 2020-02-12: qty 2

## 2020-02-12 MED ORDER — SENNOSIDES 8.8 MG/5ML PO SYRP
5.0000 mL | ORAL_SOLUTION | Freq: Two times a day (BID) | ORAL | Status: DC
  Administered 2020-02-12 – 2020-02-22 (×19): 5 mL
  Filled 2020-02-12 (×20): qty 5

## 2020-02-12 MED ORDER — PROPOFOL 1000 MG/100ML IV EMUL
INTRAVENOUS | Status: AC
  Filled 2020-02-12: qty 100
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
## 2020-02-12 NOTE — Progress Notes (Signed)
eLink Physician-Brief Progress Note Patient Name: Shawn Ramsey DOB: [birth year] MRN: [id number]   Date of Service  02/12/2020  HPI/Events of Note  RT called to notify you that sats had dropped to 88-89%, pt. bucking the vent.  Got sedation & settled.  ABG 7.39/70/95/42, sat 97%.  FiO2 initially increased to 80%, weaning down now.  Also gave bronchodilators, states pt. is better now  eICU Interventions  Camera: Obese. PIP 32. No auto peep on wave forms. No concave coving on flow/vol loop. sats > 92%. fenta at 200 mcg.   Allow permissive hypercapnia. Lung protective ventilation to avoid baro trauma, auto peep.      Intervention Category Intermediate Interventions: Respiratory distress - evaluation and management  Elmer Sow 02/12/2020, 8:05 PM

## 2020-02-12 NOTE — Progress Notes (Signed)
Pt seen at approximately 1905.  Vent alarming re: high PIP, pressure regulation trip.  Waveforms and respiratory pattern indicated some asynchrony with ventilator.  Expiratory wheezing on auscultation.  Pt was alert and responding appropriately to questions and requests.  Pt suctioned and bronchodilators administered.  Wheezing resolved, and pt settled.  PIP decreased from 45 to 62.  Spoke with RN.  Some sedation titrated for pt comfort.  Asynchrony and PIP reduced to 26.    At 1905 pt's SpO2 was reading 88% @60 %FiO2.  FiO2 increased to 80% resulting in SpO2 reading of 90-92%.  ABG obtained.  PaO2 @ 95 and ABG SpO2 reading 97%.  FiO2 titrated to 75% and will continue further weaning.  Discrepancy relayed to pt's RN, charge RN, and Elink.
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
## 2020-02-13 ENCOUNTER — Inpatient Hospital Stay (HOSPITAL_COMMUNITY): Payer: Medicaid Other | Admitting: Certified Registered Nurse Anesthetist

## 2020-02-13 ENCOUNTER — Encounter (HOSPITAL_COMMUNITY): Payer: Self-pay | Admitting: Student

## 2020-02-13 ENCOUNTER — Inpatient Hospital Stay (HOSPITAL_COMMUNITY): Payer: Medicaid Other

## 2020-02-13 ENCOUNTER — Encounter (HOSPITAL_COMMUNITY)
Admission: EM | Disposition: A | Discharge status: Home Health | Payer: Self-pay | Source: Home / Self Care | Attending: Internal Medicine

## 2020-02-13 DIAGNOSIS — N179 Acute kidney failure, unspecified: Secondary | ICD-10-CM | POA: Diagnosis not present

## 2020-02-13 DIAGNOSIS — I5033 Acute on chronic diastolic (congestive) heart failure: Secondary | ICD-10-CM | POA: Diagnosis not present

## 2020-02-13 DIAGNOSIS — J9621 Acute and chronic respiratory failure with hypoxia: Secondary | ICD-10-CM | POA: Diagnosis not present

## 2020-02-13 DIAGNOSIS — G4733 Obstructive sleep apnea (adult) (pediatric): Secondary | ICD-10-CM | POA: Diagnosis not present

## 2020-02-13 HISTORY — PX: TRACHEOSTOMY TUBE PLACEMENT: SHX814

## 2020-02-13 LAB — BASIC METABOLIC PANEL
Anion gap: 12 (ref 5–15)
BUN: 17 mg/dL (ref 6–20)
CO2: 34 mmol/L — ABNORMAL HIGH (ref 22–32)
Calcium: 9 mg/dL (ref 8.9–10.3)
Chloride: 100 mmol/L (ref 98–111)
Creatinine, Ser: 0.73 mg/dL (ref 0.61–1.24)
GFR, Estimated: >60 mL/min (ref >60)
Glucose, Bld: 123 mg/dL — ABNORMAL HIGH (ref 70–99)
Potassium: 3.5 mmol/L (ref 3.5–5.1)
Sodium: 146 mmol/L — ABNORMAL HIGH (ref 135–145)

## 2020-02-13 LAB — GLUCOSE, CAPILLARY
Glucose-Capillary: 114 mg/dL — ABNORMAL HIGH (ref 70–99)
Glucose-Capillary: 135 mg/dL — ABNORMAL HIGH (ref 70–99)
Glucose-Capillary: 106 mg/dL — ABNORMAL HIGH (ref 70–99)
Glucose-Capillary: 126 mg/dL — ABNORMAL HIGH (ref 70–99)
Glucose-Capillary: 143 mg/dL — ABNORMAL HIGH (ref 70–99)
Glucose-Capillary: 162 mg/dL — ABNORMAL HIGH (ref 70–99)

## 2020-02-13 LAB — TRIGLYCERIDES: Triglycerides: 142 mg/dL (ref <150)

## 2020-02-13 SURGERY — CREATION, TRACHEOSTOMY
Anesthesia: General | Site: Neck

## 2020-02-13 MED ORDER — ONDANSETRON HCL 4 MG/2ML IJ SOLN
INTRAMUSCULAR | Status: DC | PRN
  Administered 2020-02-13: 4 mg via INTRAVENOUS

## 2020-02-13 MED ORDER — ROCURONIUM BROMIDE 10 MG/ML (PF) SYRINGE
PREFILLED_SYRINGE | INTRAVENOUS | Status: DC | PRN
  Administered 2020-02-13: 30 mg via INTRAVENOUS
  Administered 2020-02-13: 70 mg via INTRAVENOUS

## 2020-02-13 MED ORDER — ACETAZOLAMIDE 250 MG PO TABS
500.0000 mg | ORAL_TABLET | Freq: Once | ORAL | Status: AC
  Administered 2020-02-13: 500 mg
  Filled 2020-02-13: qty 2

## 2020-02-13 MED ORDER — FREE WATER
200.0000 mL | Freq: Four times a day (QID) | Status: DC
  Administered 2020-02-13 – 2020-02-14 (×4): 200 mL

## 2020-02-13 MED ORDER — MIDAZOLAM HCL 2 MG/2ML IJ SOLN
INTRAMUSCULAR | Status: AC
  Filled 2020-02-13: qty 2

## 2020-02-13 MED ORDER — ALBUTEROL SULFATE HFA 108 (90 BASE) MCG/ACT IN AERS
INHALATION_SPRAY | RESPIRATORY_TRACT | Status: DC | PRN
  Administered 2020-02-13: 4 via RESPIRATORY_TRACT

## 2020-02-13 MED ORDER — PROPOFOL 1000 MG/100ML IV EMUL
INTRAVENOUS | Status: AC
  Filled 2020-02-13: qty 100

## 2020-02-13 MED ORDER — PROPOFOL 1000 MG/100ML IV EMUL
5.0000 ug/kg/min | INTRAVENOUS | Status: DC
  Administered 2020-02-13: 5 ug/kg/min via INTRAVENOUS
  Administered 2020-02-13 – 2020-02-14 (×6): 40 ug/kg/min via INTRAVENOUS
  Filled 2020-02-13 (×2): qty 200
  Filled 2020-02-13: qty 100
  Filled 2020-02-13: qty 200
  Filled 2020-02-13 (×2): qty 100

## 2020-02-13 MED ORDER — PROPOFOL 10 MG/ML IV BOLUS
INTRAVENOUS | Status: AC
  Filled 2020-02-13: qty 20

## 2020-02-13 MED ORDER — POTASSIUM CHLORIDE 20 MEQ PO PACK
40.0000 meq | PACK | Freq: Once | ORAL | Status: AC
  Administered 2020-02-13: 40 meq
  Filled 2020-02-13: qty 2

## 2020-02-13 MED ORDER — FENTANYL CITRATE (PF) 250 MCG/5ML IJ SOLN
INTRAMUSCULAR | Status: AC
  Filled 2020-02-13: qty 5

## 2020-02-13 MED ORDER — DEXAMETHASONE SODIUM PHOSPHATE 10 MG/ML IJ SOLN
INTRAMUSCULAR | Status: DC | PRN
  Administered 2020-02-13: 5 mg via INTRAVENOUS

## 2020-02-13 MED ORDER — LIDOCAINE-EPINEPHRINE 1 %-1:100000 IJ SOLN
INTRAMUSCULAR | Status: AC
  Filled 2020-02-13: qty 1

## 2020-02-13 MED ORDER — PROPOFOL 500 MG/50ML IV EMUL
INTRAVENOUS | Status: DC | PRN
  Administered 2020-02-13: 40 ug/kg/min via INTRAVENOUS

## 2020-02-13 MED ORDER — LACTATED RINGERS IV SOLN: INTRAVENOUS | Status: DC | PRN

## 2020-02-13 MED ORDER — 0.9 % SODIUM CHLORIDE (POUR BTL) OPTIME
TOPICAL | Status: DC | PRN
  Administered 2020-02-13: 1000 mL

## 2020-02-13 MED ORDER — MIDAZOLAM HCL 5 MG/5ML IJ SOLN
INTRAMUSCULAR | Status: DC | PRN
  Administered 2020-02-13: 2 mg via INTRAVENOUS

## 2020-02-13 MED FILL — Medication: Qty: 1 | Status: AC

## 2020-02-13 SURGICAL SUPPLY — 40 items
BENZOIN TINCTURE PRP APPL 2/3 (GAUZE/BANDAGES/DRESSINGS) IMPLANT
BLADE CLIPPER SURG (BLADE) IMPLANT
CANISTER SUCT 3000ML PPV (MISCELLANEOUS) ×2 IMPLANT
CLEANER TIP ELECTROSURG 2X2 (MISCELLANEOUS) ×2 IMPLANT
COAGULATOR SUCT SWTCH 10FR 6 (ELECTROSURGICAL) ×1 IMPLANT
COVER SURGICAL LIGHT HANDLE (MISCELLANEOUS) ×2 IMPLANT
COVER WAND RF STERILE (DRAPES) ×2 IMPLANT
DECANTER SPIKE VIAL GLASS SM (MISCELLANEOUS) ×2 IMPLANT
DRAPE HALF SHEET 40X57 (DRAPES) ×1 IMPLANT
DRSG MEPILEX BORD LITE 2X5 (GAUZE/BANDAGES/DRESSINGS) ×1 IMPLANT
ELECT COATED BLADE 2.86 ST (ELECTRODE) ×2 IMPLANT
ELECT REM PT RETURN 9FT ADLT (ELECTROSURGICAL) ×2
ELECTRODE REM PT RTRN 9FT ADLT (ELECTROSURGICAL) ×1 IMPLANT
GAUZE 4X4 16PLY RFD (DISPOSABLE) ×3 IMPLANT
GAUZE SPONGE 4X4 12PLY STRL (GAUZE/BANDAGES/DRESSINGS) ×1 IMPLANT
GLOVE ECLIPSE 7.5 STRL STRAW (GLOVE) ×2 IMPLANT
GOWN STRL REUS W/ TWL LRG LVL3 (GOWN DISPOSABLE) ×2 IMPLANT
GOWN STRL REUS W/TWL LRG LVL3 (GOWN DISPOSABLE) ×4
KIT BASIN OR (CUSTOM PROCEDURE TRAY) ×2 IMPLANT
KIT TURNOVER KIT B (KITS) ×2 IMPLANT
MAT PREVALON FULL STRYKER (MISCELLANEOUS) ×1 IMPLANT
NDL PRECISIONGLIDE 27X1.5 (NEEDLE) ×1 IMPLANT
NEEDLE PRECISIONGLIDE 27X1.5 (NEEDLE) ×2 IMPLANT
NS IRRIG 1000ML POUR BTL (IV SOLUTION) ×2 IMPLANT
PAD ARMBOARD 7.5X6 YLW CONV (MISCELLANEOUS) ×4 IMPLANT
PENCIL FOOT CONTROL (ELECTRODE) ×2 IMPLANT
SUT CHROMIC 2 0 SH (SUTURE) ×2 IMPLANT
SUT ETHILON 3 0 PS 1 (SUTURE) ×2 IMPLANT
SUT SILK 3 0 (SUTURE) ×2
SUT SILK 3-0 18XBRD TIE 12 (SUTURE) IMPLANT
SUT SILK 4 0 (SUTURE) ×2
SUT SILK 4 0 TIE 10X30 (SUTURE) ×2 IMPLANT
SUT SILK 4-0 18XBRD TIE 12 (SUTURE) ×1 IMPLANT
SYR 20ML LL LF (SYRINGE) ×2 IMPLANT
SYR CONTROL 10ML LL (SYRINGE) IMPLANT
TOWEL GREEN STERILE FF (TOWEL DISPOSABLE) ×2 IMPLANT
TRAY ENT MC OR (CUSTOM PROCEDURE TRAY) ×2 IMPLANT
TUBE CONNECTING 12X1/4 (SUCTIONS) ×2 IMPLANT
TUBE TRACH ADJ TTS 7MM (TUBING) ×1 IMPLANT
WATER STERILE IRR 1000ML POUR (IV SOLUTION) ×2 IMPLANT

## 2020-02-13 NOTE — Interval H&P Note (Signed)
History and Physical Interval Note:  02/13/2020 11:51 AM  Shawn Ramsey  has presented today for surgery, with the diagnosis of acute respiratory failure.  The various methods of treatment have been discussed with the patient and family. After consideration of risks, benefits and other options for treatment, the patient has consented to  Procedure(s): TRACHEOSTOMY (N/A) as a surgical intervention.  The patient's history has been reviewed, patient examined, no change in status, stable for surgery.  I have reviewed the patient's chart and labs.  Questions were answered to the patient's satisfaction.     Izora Gala

## 2020-02-13 NOTE — Op Note (Signed)
02/13/2020 1:01 PM   PATIENT:  Shawn Ramsey, 39 y.o. male  PRE-OPERATIVE DIAGNOSIS:  acute respiratory failure  POST-OPERATIVE DIAGNOSIS:  acute respiratory failure   PROCEDURE:  Procedure(s): TRACHEOSTOMY  SURGEON:  Surgeon(s): Beckie Salts, MD  ASSISTANTS: none   ANESTHESIA:   general  EBL: Minimal   DRAINS: none   LOCAL MEDICATIONS USED:  NONE  COUNTS CORRECT:  YES  PROCEDURE DETAILS: Patient was taken to the operating room and placed on the operating table in the supine position.  Patient had a very short neck with limited range of motion and extension was minimal.  A shoulder roll was not able to be placed for positioning. The patient was previously orally intubated. The neck was prepped and draped in a standard fashion. A vertical incision was created just above the sternal notch using electrocautery. The midline fascia was divided. The isthmus of the thyroid was reflected superiorly and the larynx and upper trachea was exposed.  The thyroid notch was at about the level of the clavicles and the cricoid was retrosternal.  I was able to use a large tracheostomy hook to elevate the larynx and trachea to expose the cricoid and the first ring of the trachea.  I had to make the tracheostomy vertically between the cricoid and the first ring.  Under direct visualization the endotracheal tube was withdrawn to just above my tracheostomy incision and then I was able to insert a Bivona 7 mm inner diameter adjustable length tracheostomy.  Positioning was confirmed using the ventilator and CO2 monitor.  Oxygen saturation immediately improved up into the mid to high 90s.  It had been in the 80s prior to the procedure.  AThe orotracheal tube was removed.  The shield was secured to the neck using a Velcro straps and silk suture.  The Velcro strap had to be lengthened with the second 1 secured with silk suture to each other.  The patient was then transferred back to the intensive care unit in  critical condition.  PLAN OF CARE: Transfer to ICU  PATIENT DISPOSITION:  ICU - hemodynamically stable.

## 2020-02-13 NOTE — Progress Notes (Signed)
Patient transported to OR with CRNA and other staff bedside and paper copy consent was given. Pt VSS at time of transport.

## 2020-02-13 NOTE — Progress Notes (Signed)
Pharmacy Electrolyte Replacement  Recent Labs:  Recent Labs    02/11/20 1633 02/12/20 0135 02/13/20 0011  K  --    < > 3.5  MG 1.9  --   --   PHOS 4.1  --   --   CREATININE  --    < > 0.73   < > = values in this interval not displayed.    Low Critical Values (K </= 2.5, Phos </= 1, Mg </= 1) Present: None  Plan: KCl 40 mEq x 1  Barth Kirks, PharmD, BCPS, BCCCP Clinical Pharmacist [phone]  Please check AMION for all [place] numbers  02/13/2020 7:25 AM

## 2020-02-13 NOTE — Transfer of Care (Signed)
Immediate Anesthesia Transfer of Care Note  Patient: Shawn Ramsey  Procedure(s) Performed: TRACHEOSTOMY (N/A Neck)  Patient Location: ICU  Anesthesia Type:General  Level of Consciousness: sedated  Airway & Oxygen Therapy: Patient remains intubated per anesthesia plan and Patient placed on Ventilator (see vital sign flow sheet for setting)  Post-op Assessment: Report given to RN and Post -op Vital signs reviewed and stable  Post vital signs: Reviewed and stable  Last Vitals:  Vitals Value Taken Time  BP    Temp    Pulse 105 02/13/20 1319  Resp 20 02/13/20 1319  SpO2 91 % 02/13/20 1319  Vitals shown include unvalidated device data.  Last Pain:  Vitals:   02/13/20 1116  TempSrc: Axillary  PainSc:          Complications: No complications documented.

## 2020-02-13 NOTE — Progress Notes (Addendum)
Nutrition Follow-up  DOCUMENTATION CODES:   Morbid obesity  INTERVENTION:   Resume TF via NG tube: Vital High Protein at 60 ml/h (1440 ml per day) Prosource TF 45 ml BID  Provides 1520 kcal, 148 gm protein, 1204 ml free water daily  NUTRITION DIAGNOSIS:   Inadequate oral intake related to inability to eat as evidenced by NPO status.  Ongoing  GOAL:   Patient will meet greater than or equal to 90% of their needs   Unmet  MONITOR:   TF tolerance,Vent status,Diet advancement,Labs  REASON FOR ASSESSMENT:   Ventilator,Consult Enteral/tube feeding initiation and management  ASSESSMENT:   39 yo male admitted with acute on chronic hypercapnic respiratory failure. Required intubation in the ED. PMH includes OHS/OSA (home oxygen 3 L), DM, HF, asthma, COPD, cognitive developmental delay, psychotic disorder.   Discussed patient in ICU rounds and with RN today. S/P tracheostomy in OR today by ENT. OG tube out and TF off since procedure. RN placed NG tube and TF has been resumed.  Plans for Cortrak placement tomorrow.  Patient is currently intubated on ventilator support MV: 13.8 L/min Temp (24hrs), Avg:98.9 F (37.2 C), Min:98.4 F (36.9 C), Max:99.2 F (37.3 C)   Labs reviewed. Na 146 CBG: 114-135-106  Medications reviewed and include Colace, Lasix, HCTZ, Novolog SSI, Miralax, KCl, Senokot. Free water flushes added today, 200 ml every 6 hours for hypernatremia.  Weight down to 163 kg today from 172.6 kg on 12/20.   Diet Order:   Diet Order    None      EDUCATION NEEDS:   Not appropriate for education at this time  Skin:  Skin Assessment: Reviewed RN Assessment  Last BM:  no BM documented  Height:   Ht Readings from Last 1 Encounters:  02/10/20 5\' 5"  (1.651 m)    Weight:   Wt Readings from Last 1 Encounters:  02/13/20 (!) 163 kg    Ideal Body Weight:  61.8 kg  BMI:  Body mass index is 59.8 kg/m.  Estimated Nutritional Needs:   Kcal:   7829-5621  Protein:  135-155 gm  Fluid:  1.8 L    Lucas Mallow, RD, LDN, CNSC Please refer to Amion for contact information.
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
## 2020-02-14 ENCOUNTER — Other Ambulatory Visit: Payer: Self-pay | Admitting: Family Medicine

## 2020-02-14 DIAGNOSIS — N179 Acute kidney failure, unspecified: Secondary | ICD-10-CM | POA: Diagnosis not present

## 2020-02-14 DIAGNOSIS — J9621 Acute and chronic respiratory failure with hypoxia: Secondary | ICD-10-CM | POA: Diagnosis not present

## 2020-02-14 DIAGNOSIS — I10 Essential (primary) hypertension: Secondary | ICD-10-CM | POA: Diagnosis not present

## 2020-02-14 LAB — BASIC METABOLIC PANEL
Anion gap: 14 (ref 5–15)
BUN: 22 mg/dL — ABNORMAL HIGH (ref 6–20)
CO2: 33 mmol/L — ABNORMAL HIGH (ref 22–32)
Calcium: 8.9 mg/dL (ref 8.9–10.3)
Chloride: 99 mmol/L (ref 98–111)
Creatinine, Ser: 0.79 mg/dL (ref 0.61–1.24)
GFR, Estimated: >60 mL/min (ref >60)
Glucose, Bld: 122 mg/dL — ABNORMAL HIGH (ref 70–99)
Potassium: 4.1 mmol/L (ref 3.5–5.1)
Sodium: 146 mmol/L — ABNORMAL HIGH (ref 135–145)

## 2020-02-14 LAB — GLUCOSE, CAPILLARY
Glucose-Capillary: 129 mg/dL — ABNORMAL HIGH (ref 70–99)
Glucose-Capillary: 117 mg/dL — ABNORMAL HIGH (ref 70–99)
Glucose-Capillary: 137 mg/dL — ABNORMAL HIGH (ref 70–99)
Glucose-Capillary: 128 mg/dL — ABNORMAL HIGH (ref 70–99)
Glucose-Capillary: 135 mg/dL — ABNORMAL HIGH (ref 70–99)
Glucose-Capillary: 137 mg/dL — ABNORMAL HIGH (ref 70–99)

## 2020-02-14 LAB — MAGNESIUM: Magnesium: 2.5 mg/dL — ABNORMAL HIGH (ref 1.7–2.4)

## 2020-02-14 MED ORDER — LISINOPRIL 10 MG PO TABS
20.0000 mg | ORAL_TABLET | Freq: Every day | ORAL | Status: DC
  Administered 2020-02-14: 20 mg
  Filled 2020-02-14: qty 2

## 2020-02-14 MED ORDER — ENOXAPARIN SODIUM 80 MG/0.8ML ~~LOC~~ SOLN
80.0000 mg | Freq: Every day | SUBCUTANEOUS | Status: DC
  Administered 2020-02-14 – 2020-02-29 (×16): 80 mg via SUBCUTANEOUS
  Filled 2020-02-14 (×18): qty 0.8

## 2020-02-14 MED ORDER — FREE WATER
200.0000 mL | Status: DC
  Administered 2020-02-14 – 2020-02-20 (×35): 200 mL

## 2020-02-14 NOTE — Progress Notes (Signed)
Pt placed on PSV by Dr. Tacy Learn.
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
## 2020-02-14 NOTE — Progress Notes (Signed)
Postop day 1, no new problems.  He has been weaned off the ventilator to pressure support.  Tracheostomy is in good position.  There is no bleeding.  He is awake and alert.  Since this tracheostomy was so difficult due to his anatomy I would recommend we keep this tracheostomy in place for about 2 weeks and then I will change it personally.  The cuff can be deflated when clinically appropriate, off the ventilator completely.

## 2020-02-14 NOTE — Procedures (Signed)
Cortrak  Person Inserting Tube:  Lillianna Sabel C, RD Tube Type:  Cortrak - 43 inches Tube Location:  Right nare Initial Placement:  Stomach Secured by: Bridle Technique Used to Measure Tube Placement:  Documented cm marking at nare/ corner of mouth Cortrak Secured At:  71 cm    Cortrak Tube Team Note:  Consult received to place a Cortrak feeding tube.   No x-ray is required. RN may begin using tube.    If the tube becomes dislodged please keep the tube and contact the Cortrak team at [email or web address] (password [email or web address]) for replacement.  If after hours and replacement cannot be delayed, place a NG tube and confirm placement with an abdominal x-ray.    Zyan Coby P., RD, LDN, CNSC See AMiON for contact information    

## 2020-02-14 NOTE — Progress Notes (Signed)
Pt was placed on trach collar per Dr. Jeanella Craze request.

## 2020-02-14 NOTE — Progress Notes (Signed)
Pt placed back on CPAP/PS as documented due to increased heart rate, increased work of breathing and decreased SpO2.

## 2020-02-14 NOTE — Anesthesia Postprocedure Evaluation (Signed)
Anesthesia Post Note  Patient: Shawn Ramsey  Procedure(s) Performed: TRACHEOSTOMY (N/A Neck)     Patient location during evaluation: SICU Anesthesia Type: General Level of consciousness: sedated Pain management: pain level controlled Vital Signs Assessment: post-procedure vital signs reviewed and stable Respiratory status: patient on ventilator - see flowsheet for VS Cardiovascular status: blood pressure returned to baseline and stable Postop Assessment: no apparent nausea or vomiting Anesthetic complications: no   No complications documented.  Last Vitals:  Vitals:   02/14/20 1800 02/14/20 1900  BP: (!) 141/105 135/76  Pulse: (!) 116 (!) 111  Resp: (!) 23 20  Temp:    SpO2: 93% 92%    Last Pain:  Vitals:   02/14/20 1613  TempSrc: Axillary  PainSc:                  Raymondo Garcialopez DAVID

## 2020-02-14 NOTE — Anesthesia Preprocedure Evaluation (Signed)
Anesthesia Evaluation  Patient identified by MRN, date of birth, ID band Patient unresponsive    Reviewed: Allergy & Precautions, NPO status , Patient's Chart, lab work & pertinent test results, Unable to perform ROS - Chart review only  Airway Mallampati: Intubated       Dental   Pulmonary sleep apnea , COPD, Current Smoker,    Pulmonary exam normal        Cardiovascular hypertension, Pt. on medications Normal cardiovascular exam     Neuro/Psych Anxiety Depression Schizophrenia    GI/Hepatic   Endo/Other  diabetes  Renal/GU      Musculoskeletal   Abdominal   Peds  Hematology   Anesthesia Other Findings   Reproductive/Obstetrics                             Anesthesia Physical Anesthesia Plan  ASA: III  Anesthesia Plan: General   Post-op Pain Management:    Induction: Intravenous  PONV Risk Score and Plan: 1 and Treatment may vary due to age or medical condition  Airway Management Planned: Oral ETT  Additional Equipment:   Intra-op Plan:   Post-operative Plan:   Informed Consent: I have reviewed the patients History and Physical, chart, labs and discussed the procedure including the risks, benefits and alternatives for the proposed anesthesia with the patient or authorized representative who has indicated his/her understanding and acceptance.       Plan Discussed with: CRNA and Surgeon  Anesthesia Plan Comments:         Anesthesia Quick Evaluation

## 2020-02-15 DIAGNOSIS — F419 Anxiety disorder, unspecified: Secondary | ICD-10-CM | POA: Diagnosis not present

## 2020-02-15 DIAGNOSIS — J9622 Acute and chronic respiratory failure with hypercapnia: Secondary | ICD-10-CM | POA: Diagnosis not present

## 2020-02-15 DIAGNOSIS — J9621 Acute and chronic respiratory failure with hypoxia: Secondary | ICD-10-CM | POA: Diagnosis not present

## 2020-02-15 LAB — COMPREHENSIVE METABOLIC PANEL
ALT: 36 U/L (ref 0–44)
AST: 85 U/L — ABNORMAL HIGH (ref 15–41)
Albumin: 3 g/dL — ABNORMAL LOW (ref 3.5–5.0)
Alkaline Phosphatase: 60 U/L (ref 38–126)
Anion gap: 11 (ref 5–15)
BUN: 24 mg/dL — ABNORMAL HIGH (ref 6–20)
CO2: 35 mmol/L — ABNORMAL HIGH (ref 22–32)
Calcium: 9.1 mg/dL (ref 8.9–10.3)
Chloride: 99 mmol/L (ref 98–111)
Creatinine, Ser: 0.73 mg/dL (ref 0.61–1.24)
GFR, Estimated: >60 mL/min (ref >60)
Glucose, Bld: 135 mg/dL — ABNORMAL HIGH (ref 70–99)
Potassium: 3.3 mmol/L — ABNORMAL LOW (ref 3.5–5.1)
Sodium: 145 mmol/L (ref 135–145)
Total Bilirubin: 0.6 mg/dL (ref 0.3–1.2)
Total Protein: 7.7 g/dL (ref 6.5–8.1)

## 2020-02-15 LAB — CULTURE, BLOOD (ROUTINE X 2)
Culture: NO GROWTH
Culture: NO GROWTH

## 2020-02-15 LAB — GLUCOSE, CAPILLARY
Glucose-Capillary: 128 mg/dL — ABNORMAL HIGH (ref 70–99)
Glucose-Capillary: 133 mg/dL — ABNORMAL HIGH (ref 70–99)
Glucose-Capillary: 136 mg/dL — ABNORMAL HIGH (ref 70–99)
Glucose-Capillary: 141 mg/dL — ABNORMAL HIGH (ref 70–99)
Glucose-Capillary: 149 mg/dL — ABNORMAL HIGH (ref 70–99)
Glucose-Capillary: 153 mg/dL — ABNORMAL HIGH (ref 70–99)

## 2020-02-15 LAB — MAGNESIUM: Magnesium: 2.4 mg/dL (ref 1.7–2.4)

## 2020-02-15 LAB — TRIGLYCERIDES: Triglycerides: 173 mg/dL — ABNORMAL HIGH (ref <150)

## 2020-02-15 LAB — CBC
HCT: 38.7 % — ABNORMAL LOW (ref 39.0–52.0)
Hemoglobin: 11.7 g/dL — ABNORMAL LOW (ref 13.0–17.0)
MCH: 27.2 pg (ref 26.0–34.0)
MCHC: 30.2 g/dL (ref 30.0–36.0)
MCV: 90 fL (ref 80.0–100.0)
Platelets: 344 10*3/uL (ref 150–400)
RBC: 4.3 MIL/uL (ref 4.22–5.81)
RDW: 15.5 % (ref 11.5–15.5)
WBC: 15 10*3/uL — ABNORMAL HIGH (ref 4.0–10.5)
nRBC: 0 % (ref 0.0–0.2)

## 2020-02-15 LAB — PHOSPHORUS: Phosphorus: 3.8 mg/dL (ref 2.5–4.6)

## 2020-02-15 MED ORDER — POTASSIUM CHLORIDE 10 MEQ/100ML IV SOLN
10.0000 meq | INTRAVENOUS | Status: AC
  Administered 2020-02-15 (×4): 10 meq via INTRAVENOUS
  Filled 2020-02-15 (×4): qty 100

## 2020-02-15 MED ORDER — ACETAZOLAMIDE 250 MG PO TABS
500.0000 mg | ORAL_TABLET | Freq: Once | ORAL | Status: AC
  Administered 2020-02-15: 500 mg
  Filled 2020-02-15: qty 2

## 2020-02-15 MED ORDER — CLONAZEPAM 0.5 MG PO TABS
0.5000 mg | ORAL_TABLET | Freq: Two times a day (BID) | ORAL | Status: DC | PRN
  Administered 2020-02-16 – 2020-02-20 (×8): 0.5 mg
  Filled 2020-02-15 (×11): qty 1

## 2020-02-15 MED ORDER — CLONAZEPAM 0.1 MG/ML ORAL SUSPENSION
0.5000 mg | Freq: Two times a day (BID) | ORAL | Status: AC | PRN
  Administered 2020-02-15: 0.5 mg
  Filled 2020-02-15: qty 5

## 2020-02-15 MED ORDER — FENTANYL CITRATE (PF) 100 MCG/2ML IJ SOLN
25.0000 ug | INTRAMUSCULAR | Status: DC | PRN
  Administered 2020-02-15: 50 ug via INTRAVENOUS
  Filled 2020-02-15: qty 2

## 2020-02-15 MED ORDER — ACETAZOLAMIDE 250 MG PO TABS
500.0000 mg | ORAL_TABLET | Freq: Once | ORAL | Status: DC
  Filled 2020-02-15: qty 2

## 2020-02-15 MED ORDER — POTASSIUM CHLORIDE 20 MEQ PO PACK
20.0000 meq | PACK | ORAL | Status: AC
  Administered 2020-02-15 (×2): 20 meq
  Filled 2020-02-15 (×2): qty 1

## 2020-02-15 MED ORDER — CLONAZEPAM 0.1 MG/ML ORAL SUSPENSION
0.5000 mg | Freq: Two times a day (BID) | ORAL | Status: DC | PRN
  Filled 2020-02-15 (×2): qty 5

## 2020-02-15 MED ORDER — LACTULOSE 10 GM/15ML PO SOLN
20.0000 g | Freq: Once | ORAL | Status: AC
  Administered 2020-02-15: 20 g
  Filled 2020-02-15: qty 30

## 2020-02-15 MED ORDER — CARVEDILOL 3.125 MG PO TABS
3.1250 mg | ORAL_TABLET | Freq: Two times a day (BID) | ORAL | Status: DC
  Administered 2020-02-15: 3.125 mg
  Filled 2020-02-15 (×3): qty 1

## 2020-02-15 MED ORDER — LIP MEDEX EX OINT
TOPICAL_OINTMENT | CUTANEOUS | Status: DC | PRN
  Filled 2020-02-15: qty 7

## 2020-02-15 MED ORDER — LABETALOL HCL 5 MG/ML IV SOLN
10.0000 mg | Freq: Four times a day (QID) | INTRAVENOUS | Status: DC | PRN
  Administered 2020-02-15 – 2020-02-26 (×3): 10 mg via INTRAVENOUS
  Filled 2020-02-15 (×3): qty 4

## 2020-02-15 MED ORDER — LACTULOSE 10 GM/15ML PO SOLN: 20.0000 g | Freq: Once | ORAL | Status: DC

## 2020-02-15 MED ORDER — LISINOPRIL 10 MG PO TABS
10.0000 mg | ORAL_TABLET | Freq: Every day | ORAL | Status: DC
  Administered 2020-02-15: 10 mg
  Filled 2020-02-15: qty 1
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
## 2020-02-15 NOTE — Progress Notes (Signed)
RT called to patient room due to patient having a decrease in sats and a drop in HR to 40s.  Patient had been placed back on ventilator and sats have improved to 91%.  Patient tolerating well.  Will continue to monitor.

## 2020-02-15 NOTE — Progress Notes (Signed)
[facility] Progress Note Patient Name: EBRIMA RANTA DOB: 1980/09/17 MRN: [id number]   Date of Service  02/15/2020  HPI/Events of Note  Patient has pain while on trach/vent and not on any sedation at all  eICU Interventions  PRN fentanyl      Intervention Category Intermediate Interventions: Pain - evaluation and management  Cheyanna Strick G Roman Dubuc 02/15/2020, 2:20 AM

## 2020-02-15 NOTE — Progress Notes (Signed)
Patient placed on 40% ATC.  Currently tolerating well.  Will continue to monitor.

## 2020-02-16 ENCOUNTER — Inpatient Hospital Stay (HOSPITAL_COMMUNITY): Payer: Medicaid Other

## 2020-02-16 DIAGNOSIS — J9622 Acute and chronic respiratory failure with hypercapnia: Secondary | ICD-10-CM | POA: Diagnosis not present

## 2020-02-16 DIAGNOSIS — N179 Acute kidney failure, unspecified: Secondary | ICD-10-CM | POA: Diagnosis not present

## 2020-02-16 DIAGNOSIS — J9621 Acute and chronic respiratory failure with hypoxia: Secondary | ICD-10-CM | POA: Diagnosis not present

## 2020-02-16 LAB — POCT I-STAT 7, (LYTES, BLD GAS, ICA,H+H)
Acid-Base Excess: 3 mmol/L — ABNORMAL HIGH (ref 0.0–2.0)
Acid-Base Excess: 6 mmol/L — ABNORMAL HIGH (ref 0.0–2.0)
Acid-Base Excess: 6 mmol/L — ABNORMAL HIGH (ref 0.0–2.0)
Bicarbonate: 34.2 mmol/L — ABNORMAL HIGH (ref 20.0–28.0)
Bicarbonate: 34.3 mmol/L — ABNORMAL HIGH (ref 20.0–28.0)
Bicarbonate: 33.7 mmol/L — ABNORMAL HIGH (ref 20.0–28.0)
Calcium, Ion: 1.24 mmol/L (ref 1.15–1.40)
Calcium, Ion: 1.2 mmol/L (ref 1.15–1.40)
Calcium, Ion: 1.19 mmol/L (ref 1.15–1.40)
HCT: 43 % (ref 39.0–52.0)
HCT: 39 % (ref 39.0–52.0)
HCT: 36 % — ABNORMAL LOW (ref 39.0–52.0)
Hemoglobin: 14.6 g/dL (ref 13.0–17.0)
Hemoglobin: 13.3 g/dL (ref 13.0–17.0)
Hemoglobin: 12.2 g/dL — ABNORMAL LOW (ref 13.0–17.0)
O2 Saturation: 97 %
O2 Saturation: 91 %
O2 Saturation: 95 %
Patient temperature: 98.6
Patient temperature: 98.6
Patient temperature: 98.7
Potassium: 3.7 mmol/L (ref 3.5–5.1)
Potassium: 3.4 mmol/L — ABNORMAL LOW (ref 3.5–5.1)
Potassium: 3.6 mmol/L (ref 3.5–5.1)
Sodium: 145 mmol/L (ref 135–145)
Sodium: 145 mmol/L (ref 135–145)
Sodium: 144 mmol/L (ref 135–145)
TCO2: 37 mmol/L — ABNORMAL HIGH (ref 22–32)
TCO2: 36 mmol/L — ABNORMAL HIGH (ref 22–32)
TCO2: 36 mmol/L — ABNORMAL HIGH (ref 22–32)
pCO2 arterial: 86.2 mmHg (ref 32.0–48.0)
pCO2 arterial: 66.2 mmHg (ref 32.0–48.0)
pCO2 arterial: 63.2 mmHg — ABNORMAL HIGH (ref 32.0–48.0)
pH, Arterial: 7.206 — ABNORMAL LOW (ref 7.350–7.450)
pH, Arterial: 7.322 — ABNORMAL LOW (ref 7.350–7.450)
pH, Arterial: 7.335 — ABNORMAL LOW (ref 7.350–7.450)
pO2, Arterial: 112 mmHg — ABNORMAL HIGH (ref 83.0–108.0)
pO2, Arterial: 68 mmHg — ABNORMAL LOW (ref 83.0–108.0)
pO2, Arterial: 82 mmHg — ABNORMAL LOW (ref 83.0–108.0)

## 2020-02-16 LAB — BASIC METABOLIC PANEL
Anion gap: 15 (ref 5–15)
BUN: 31 mg/dL — ABNORMAL HIGH (ref 6–20)
CO2: 27 mmol/L (ref 22–32)
Calcium: 9.1 mg/dL (ref 8.9–10.3)
Chloride: 103 mmol/L (ref 98–111)
Creatinine, Ser: 1.16 mg/dL (ref 0.61–1.24)
GFR, Estimated: >60 mL/min (ref >60)
Glucose, Bld: 143 mg/dL — ABNORMAL HIGH (ref 70–99)
Potassium: 3.3 mmol/L — ABNORMAL LOW (ref 3.5–5.1)
Sodium: 145 mmol/L (ref 135–145)

## 2020-02-16 LAB — GLUCOSE, CAPILLARY
Glucose-Capillary: 178 mg/dL — ABNORMAL HIGH (ref 70–99)
Glucose-Capillary: 117 mg/dL — ABNORMAL HIGH (ref 70–99)
Glucose-Capillary: 128 mg/dL — ABNORMAL HIGH (ref 70–99)
Glucose-Capillary: 110 mg/dL — ABNORMAL HIGH (ref 70–99)
Glucose-Capillary: 129 mg/dL — ABNORMAL HIGH (ref 70–99)
Glucose-Capillary: 114 mg/dL — ABNORMAL HIGH (ref 70–99)

## 2020-02-16 MED ORDER — POTASSIUM CHLORIDE 10 MEQ/100ML IV SOLN
10.0000 meq | INTRAVENOUS | Status: AC
  Administered 2020-02-16 (×4): 10 meq via INTRAVENOUS
  Filled 2020-02-16 (×4): qty 100

## 2020-02-16 MED ORDER — LISINOPRIL 10 MG PO TABS
20.0000 mg | ORAL_TABLET | Freq: Every day | ORAL | Status: DC
  Administered 2020-02-16: 20 mg
  Filled 2020-02-16: qty 2

## 2020-02-16 MED ORDER — FENTANYL CITRATE (PF) 100 MCG/2ML IJ SOLN
25.0000 ug | INTRAMUSCULAR | Status: DC | PRN
  Administered 2020-02-16: 50 ug via INTRAVENOUS
  Filled 2020-02-16: qty 2

## 2020-02-16 MED ORDER — POTASSIUM CHLORIDE 20 MEQ PO PACK
20.0000 meq | PACK | ORAL | Status: AC
  Administered 2020-02-16 (×2): 20 meq
  Filled 2020-02-16 (×2): qty 1

## 2020-02-16 MED ORDER — PIPERACILLIN-TAZOBACTAM 3.375 G IVPB
3.3750 g | Freq: Three times a day (TID) | INTRAVENOUS | Status: DC
  Administered 2020-02-16 – 2020-02-17 (×3): 3.375 g via INTRAVENOUS
  Filled 2020-02-16 (×6): qty 50

## 2020-02-16 MED ORDER — PIPERACILLIN-TAZOBACTAM 3.375 G IVPB 30 MIN
3.3750 g | INTRAVENOUS | Status: AC
  Administered 2020-02-16: 3.375 g via INTRAVENOUS
  Filled 2020-02-16: qty 50

## 2020-02-16 MED ORDER — NICOTINE 21 MG/24HR TD PT24
21.0000 mg | MEDICATED_PATCH | Freq: Every day | TRANSDERMAL | Status: DC
  Administered 2020-02-16 – 2020-03-03 (×17): 21 mg via TRANSDERMAL
  Filled 2020-02-16 (×18): qty 1

## 2020-02-16 MED ORDER — ALBUTEROL SULFATE (2.5 MG/3ML) 0.083% IN NEBU
2.5000 mg | INHALATION_SOLUTION | RESPIRATORY_TRACT | Status: DC | PRN
  Administered 2020-02-16 – 2020-03-09 (×12): 2.5 mg via RESPIRATORY_TRACT
  Filled 2020-02-16 (×12): qty 3

## 2020-02-16 MED ORDER — CARVEDILOL 12.5 MG PO TABS
6.2500 mg | ORAL_TABLET | Freq: Two times a day (BID) | ORAL | Status: DC
  Administered 2020-02-16 – 2020-02-23 (×15): 6.25 mg
  Filled 2020-02-16 (×14): qty 1

## 2020-02-16 NOTE — Progress Notes (Addendum)
eLink Physician-Brief Progress Note Patient Name: Shawn Ramsey DOB: 1980/02/24 MRN: [id number]   Date of Service  02/16/2020  HPI/Events of Note  Asked to camera into patient's room since he had dropped his O2 sat to 70? When I saw him on camera immediately, O2 sat was 92 on 100%/peep 8  I was told his initial fio2 was 40% and he had plugs in his trach tube which had to be pulled out and at present, the suction catheter is smoothly advancing in I am told he has bilateral breath sounds and sounds otherwise 'tight' Initially I was also told he was unresponsive but in a few seconds, he had woken up and was mouthing words He is not on any sedation and other vitals are stable  eICU Interventions  Stat nebs being given, cxr and abg ordered Hold sedation  It seems like a similar event occurred during the day as well. His trach was placed on 12/23. At present he is able to ventilate and oxygenate with no issues so will get cxr/abg and reassess.      Intervention Category Major Interventions: Respiratory failure - evaluation and management  Lyndsi Altic G Desani Sprung 02/16/2020, 1:19 AM   1:25 am - ABG with respiratory acidosis and patient is fully awake, alert, following all commands and O2 sat is 94. Per ENT note, trach was very difficult but currently with good volumes on vent and peak pressure is 26. Await cxr.   3 am - Reviewed CXR No pneumo and trach tube looks ok, no overt infiltrates - some atelectasis probably but still needing 100% fio2, peep is at 8. Remains on PRVC VT 450, RR 20. Fully awake, in no distress, texting on his phone and peak pressure 23-25. Had low grade fever, suspect he may have aspirated during the day and earlier tonight as well with the issues from plugging . Will initiate Zosyn and repeat an ABG at 3.30 am. Discussed with RN and orders placed  4 am: ABG noted However, this was before the patient was positioned in bed After positioning and keeping his neck fully  supported, his o2 sat has been exactly 100%  He is asleep in no distress However, even the earlier event happened when he woke up and tried to move around  With the fresh trach from 12/23, difficult tracheostomy and positional issues that caused desaturation, we will keep him comfortably positioned in bed, as now Was already given the ordered Clonopin and will only order PRN fentanyl in case he has pain, trying to avoid sedative infusions However, if he does get agitated or uncomfortable, we will initiate sedative infusion until ENT can evaluate this AM O2 sat is 100, will start to lower Fio2 and repeat an ABG at 8 am Discussed with RN   6:20 am Seen on camera a few times over last couple hours O2 sat remained 100% and no distress at all Fio2 still at 100, have requested RN to lower to 90 now and reduce further after an hour depending on how he does The follow up ABG is ordered for 8 am anyway

## 2020-02-16 NOTE — Progress Notes (Signed)
Asked to evaluate the tracheostomy.  He had 2 episodes of desaturation with positional change during the night.  Otherwise doing well and has tolerated being on trach collar for couple of hours at a time.  On exam he is resting comfortably and has the tracheostomy good position with ventilator attached.  Flexible fiberoptic endoscopy was performed through the tracheostomy.  Initially there were large amounts of thick mucus secretions.  This was suctioned out with the ventilator circuit suction cannula.  I was then able to pass the scope all the way into the distal tracheostomy tube, and beyond.  There is no obstruction.  The distal tip of the tube is well above the carina.  I reviewed the chest x-ray as well which confirms that.  I think his problem has been mucous plugging.  I talked to the pulmonary critical care physician about this.  They are going to culture the secretions and continue working on this.

## 2020-02-16 NOTE — Progress Notes (Signed)
Pharmacy Antibiotic Note  Shawn Ramsey is a 39 y.o. male admitted on 02/09/2020 with asp pna.  Pharmacy has been consulted for Zosyn dosing.  Plan: Zosyn 3.375gm IV q8h Will f/u renal function, micro data, and pt's clinical condition  Height: 5\' 5"  (165.1 cm) Weight: (!) 167.3 kg (368 lb 13.3 oz) IBW/kg (Calculated) : 61.5  Temp (24hrs), Avg:99.4 F (37.4 C), Min:98.1 F (36.7 C), Max:100.1 F (37.8 C)  Recent Labs  Lab 02/09/20 1414 02/10/20 0212 02/10/20 0624 02/10/20 1929 02/11/20 0538 02/12/20 0135 02/13/20 0011 02/14/20 0729 02/15/20 0056  WBC 7.6  --  16.2*  --  10.4 12.1*  --   --  15.0*  CREATININE 0.58*   < >  --    < > 0.71 0.68 0.73 0.79 0.73   < > = values in this interval not displayed.    Estimated Creatinine Clearance: 182 mL/min (by C-G formula based on SCr of 0.73 mg/dL).    No Known Allergies  Antimicrobials this admission: Zosyn 12/26 >> Azithro 12/19>>12/21 Ceftriaxone 12/19>>12/21   Microbiology results: 12/19 resp: normal flora  12/20 blood x2: ngtd   Thank you for allowing pharmacy to be a part of this patient's care.  Franky Macho 02/16/2020 2:59 AM
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
## 2020-02-16 NOTE — Progress Notes (Signed)
[facility] ADULT ICU REPLACEMENT PROTOCOL   The patient does apply for the [facility] Adult ICU Electrolyte Replacment Protocol based on the criteria listed below:   1. Is GFR >/= 30 ml/min? Yes.    Patient's GFR today is >60 2. Is SCr </= 2? Yes.   Patient's SCr is 1.16 ml/kg/hr 3. Did SCr increase >/= 0.5 in 24 hours? No. 4. Abnormal electrolyte(s): K+3.3 5. Ordered repletion with: protocol 6. If a panic level lab has been reported, has the CCM MD in charge been notified? Yes.  .   Physician:  Dr.Kamat  Carlisle Beers 02/16/2020 4:13 AM

## 2020-02-17 DIAGNOSIS — J9621 Acute and chronic respiratory failure with hypoxia: Secondary | ICD-10-CM | POA: Diagnosis not present

## 2020-02-17 LAB — BLOOD CULTURE ID PANEL (REFLEXED) - BCID2

## 2020-02-17 LAB — BASIC METABOLIC PANEL
Anion gap: 15 (ref 5–15)
BUN: 58 mg/dL — ABNORMAL HIGH (ref 6–20)
CO2: 26 mmol/L (ref 22–32)
Calcium: 9 mg/dL (ref 8.9–10.3)
Chloride: 103 mmol/L (ref 98–111)
Creatinine, Ser: 1.71 mg/dL — ABNORMAL HIGH (ref 0.61–1.24)
GFR, Estimated: 52 mL/min — ABNORMAL LOW (ref >60)
Glucose, Bld: 125 mg/dL — ABNORMAL HIGH (ref 70–99)
Potassium: 3.4 mmol/L — ABNORMAL LOW (ref 3.5–5.1)
Sodium: 144 mmol/L (ref 135–145)

## 2020-02-17 LAB — GLUCOSE, CAPILLARY
Glucose-Capillary: 148 mg/dL — ABNORMAL HIGH (ref 70–99)
Glucose-Capillary: 118 mg/dL — ABNORMAL HIGH (ref 70–99)
Glucose-Capillary: 111 mg/dL — ABNORMAL HIGH (ref 70–99)
Glucose-Capillary: 128 mg/dL — ABNORMAL HIGH (ref 70–99)
Glucose-Capillary: 151 mg/dL — ABNORMAL HIGH (ref 70–99)
Glucose-Capillary: 131 mg/dL — ABNORMAL HIGH (ref 70–99)
Glucose-Capillary: 155 mg/dL — ABNORMAL HIGH (ref 70–99)

## 2020-02-17 MED ORDER — SODIUM CHLORIDE 0.9% FLUSH
10.0000 mL | INTRAVENOUS | Status: DC | PRN
  Administered 2020-03-11: 10 mL

## 2020-02-17 MED ORDER — POTASSIUM CHLORIDE 20 MEQ PO PACK: 40.0000 meq | PACK | Freq: Once | ORAL | Status: DC

## 2020-02-17 MED ORDER — SODIUM CHLORIDE 0.9% FLUSH
10.0000 mL | Freq: Two times a day (BID) | INTRAVENOUS | Status: DC
  Administered 2020-02-17 – 2020-03-13 (×38): 10 mL

## 2020-02-17 MED ORDER — POTASSIUM CHLORIDE 10 MEQ/100ML IV SOLN
10.0000 meq | INTRAVENOUS | Status: AC
  Administered 2020-02-17 (×2): 10 meq via INTRAVENOUS
  Filled 2020-02-17 (×2): qty 100

## 2020-02-17 MED ORDER — LACTATED RINGERS IV SOLN: INTRAVENOUS | Status: AC

## 2020-02-17 MED ORDER — LINEZOLID 600 MG/300ML IV SOLN
600.0000 mg | Freq: Two times a day (BID) | INTRAVENOUS | Status: AC
  Administered 2020-02-17 – 2020-02-23 (×14): 600 mg via INTRAVENOUS
  Filled 2020-02-17 (×15): qty 300

## 2020-02-17 NOTE — Progress Notes (Addendum)
NAME:  Shawn Ramsey, MRN:  [id number], DOB:  Oct 04, 1980, LOS: 8 ADMISSION DATE:  02/09/2020, CONSULTATION DATE:  02/14/20 REFERRING MD:  Mariea Clonts, CHIEF COMPLAINT: Drowsiness and dyspnea  Brief History   39 y o M with OHS/OSA, asthma intubated for acute on chronic hypercapnic respiratory failure He apparently remained unresponsive on trial of NIV and so he was intubated there. Intubation in ED complicated by some posterior oropharyngeal trauma with bleeding noted there during anesthesia's attempt.   Past Medical History  Asthma OHS/OSA on home O2 3L not yet adherent to BiPAP, followed in Dr. Evlyn Courier clinic Diabetes HFpEF  Significant Hospital Events   12/19 intubated  12/19 admitted  Consults:  ENT  Procedures:  12/19 ETT placement 12/23 trach by ENT  Significant Diagnostic Tests:  02/09/20 CXR with RUL infiltrate vs atelectasis new post-intubation relative to pre-intubation, retrocardiac opacity  Micro Data:  12/20 MRSA screen positive Sputum 12/26>>  Antimicrobials:  Ceftriaxone 12/19 > 12/21 Azithromycin 12/19 > 12/21  Zosyn 12/25>>  Interim history/subjective:  Patient back on full vent support. Denies pain. Motioning toward his backside but cannot figure out what he is trying to say.  Objective   Blood pressure (!) 89/42, pulse 92, temperature 99.4 F (37.4 C), temperature source Oral, resp. rate (!) 25, height 5\' 5"  (1.651 m), weight (!) 159 kg, SpO2 97 %.    Vent Mode: PRVC FiO2 (%):  [40 %-90 %] 40 % Set Rate:  [20 bmp] 20 bmp Vt Set:  [450 mL] 450 mL PEEP:  [8 cmH20] 8 cmH20 Pressure Support:  [8 cmH20] 8 cmH20 Plateau Pressure:  [20 cmH20-23 cmH20] 20 cmH20   Intake/Output Summary (Last 24 hours) at 02/17/2020 0715 Last data filed at 02/17/2020 0700 Gross per 24 hour  Intake 3418.1 ml  Output 975 ml  Net 2443.1 ml   Filed Weights   02/15/20 0640 02/16/20 0437 02/17/20 0350  Weight: (!) 167.3 kg (!) 160.3 kg (!) 159 kg     Examination: Constitutional: no acute distress  Eyes: EOMI, pupils equal Ears, nose, mouth, and throat: trach in place with thick white secretions, malampatti 4 Cardiovascular: RRR, ext warm Respiratory: good TV on PS, diminished breath sounds bases Gastrointestinal: soft, hypoactive bs Skin: No rashes, normal turgor Neurologic: moves all 4 ext to command Psychiatric: RASS 0  BUN/Cr up Uop not fully accounted for Sugars okay  Resolved Hospital Problem list    Acute metabolic encephalopathy due to hypercapnia  Assessment & Plan:  Acute on chronic hypoxic/hypercapnic respiratory failure status post trach 12/24 by ENT with difficult upper airway anatomy- weaning attempts complicated by mucus plugging question infectious tracheiitis Morbid obesity Diabetes type 2 Uncontrolled hypertension Chronic diastolic congestive heart failure Afib Severe anxiety Hypokalemia Acute kidney injury- worse today, associated hypotension, question over-diuresis  - Restart PTA eliquis - PS today, can consider TC later depending on how he looks - Trach exchange by ENT only - DC lisinopril, gentle IV hydration x 1.5L, monitor renal indices - On scopolamine patch as trial - Continue zosyn, f/u culture data from tracheal aspirate - Up to chair  Best practice:  Diet: NPO, tube feeds Pain/Anxiety/Delirium protocol (if indicated): Klonopin as needed VAP protocol (if indicated): yes DVT prophylaxis: lovenox GI prophylaxis: famotidine Glucose control: SSI Mobility: As tolerated Code Status: Full  Family Communication: Patient's mother was updated at bedside Goals of care discussion: 12/22 carried out, opted for aggressive care, keeping CODE STATUS as full.  Next goals of care discussion: 12/29 Disposition: ICU  Patient critically ill due to respiratory failure, acute kidney injury Interventions to address this today ventilator weaning, hydration Risk of deterioration without these  interventions is high  I personally spent 34 minutes providing critical care not including any separately billable procedures  Erskine Emery MD [facility] 02/17/2020 7:27 AM Personal pager: [phone] If unanswered, please page CCM On-call: [phone]

## 2020-02-17 NOTE — Progress Notes (Signed)
eLink Physician-Brief Progress Note Patient Name: Shawn Ramsey DOB: [birth year] MRN: [id number]   Date of Service  02/17/2020  HPI/Events of Note  AM K+ 3.4         Cr has risen to 1.71    on vent with NG  eICU Interventions  Kcl 10 meq q1 x 2     Intervention Category Intermediate Interventions: Electrolyte abnormality - evaluation and management  Ranee Gosselin 02/17/2020, 5:02 AM

## 2020-02-17 NOTE — Progress Notes (Signed)
Pt placed on 40% ATC and is tolerating well at this time. RN aware.

## 2020-02-17 NOTE — Progress Notes (Signed)
Pt placed back on PSV 8/8 due to increased WOB. Pt tolerated 2 hours of ATC.

## 2020-02-17 NOTE — Progress Notes (Signed)
PHARMACY - PHYSICIAN COMMUNICATION CRITICAL VALUE ALERT - BLOOD CULTURE IDENTIFICATION (BCID)  Shawn Ramsey is an 39 y.o. male who presented to [facility] on 02/09/2020 with a chief complaint of acute respiratory failure currently on piperacillin/tazobactam with respiratory cultures growing S. Aureus. Blood cultures now positive for Staph spp. in 1 aerobic bottle (only 2 aerobic bottles drawn in total) - likely represents contamination.   Assessment: Bcx + for 1/2 bottles with Staph spp (without resistance) - likely contamination  Name of physician (or Provider) Contacted: Garvin Fila, PharmD  Current antibiotics: Piperacillin/tazobactam  Changes to prescribed antibiotics recommended:  No changes needed as likely represents contamination   Results for orders placed or performed during the hospital encounter of 02/09/20  Blood Culture ID Panel (Reflexed) (Collected: 02/16/2020  4:25 AM)  Result Value Ref Range   Enterococcus faecalis NOT DETECTED NOT DETECTED   Enterococcus Faecium NOT DETECTED NOT DETECTED   Listeria monocytogenes NOT DETECTED NOT DETECTED   Staphylococcus species DETECTED (A) NOT DETECTED   Staphylococcus aureus (BCID) NOT DETECTED NOT DETECTED   Staphylococcus epidermidis NOT DETECTED NOT DETECTED   Staphylococcus lugdunensis NOT DETECTED NOT DETECTED   Streptococcus species NOT DETECTED NOT DETECTED   Streptococcus agalactiae NOT DETECTED NOT DETECTED   Streptococcus pneumoniae NOT DETECTED NOT DETECTED   Streptococcus pyogenes NOT DETECTED NOT DETECTED   A.calcoaceticus-baumannii NOT DETECTED NOT DETECTED   Bacteroides fragilis NOT DETECTED NOT DETECTED   Enterobacterales NOT DETECTED NOT DETECTED   Enterobacter cloacae complex NOT DETECTED NOT DETECTED   Escherichia coli NOT DETECTED NOT DETECTED   Klebsiella aerogenes NOT DETECTED NOT DETECTED   Klebsiella oxytoca NOT DETECTED NOT DETECTED   Klebsiella pneumoniae NOT DETECTED NOT DETECTED   Proteus  species NOT DETECTED NOT DETECTED   Salmonella species NOT DETECTED NOT DETECTED   Serratia marcescens NOT DETECTED NOT DETECTED   Haemophilus influenzae NOT DETECTED NOT DETECTED   Neisseria meningitidis NOT DETECTED NOT DETECTED   Pseudomonas aeruginosa NOT DETECTED NOT DETECTED   Stenotrophomonas maltophilia NOT DETECTED NOT DETECTED   Candida albicans NOT DETECTED NOT DETECTED   Candida auris NOT DETECTED NOT DETECTED   Candida glabrata NOT DETECTED NOT DETECTED   Candida krusei NOT DETECTED NOT DETECTED   Candida parapsilosis NOT DETECTED NOT DETECTED   Candida tropicalis NOT DETECTED NOT DETECTED   Cryptococcus neoformans/gattii NOT DETECTED NOT DETECTED    Margarite Gouge, PharmD PGY2 ID Pharmacy Resident [id number]  02/17/2020  12:38 PM

## 2020-02-18 ENCOUNTER — Encounter (HOSPITAL_COMMUNITY): Payer: Self-pay | Admitting: Otolaryngology

## 2020-02-18 ENCOUNTER — Inpatient Hospital Stay (HOSPITAL_COMMUNITY): Payer: Medicaid Other

## 2020-02-18 DIAGNOSIS — J9621 Acute and chronic respiratory failure with hypoxia: Secondary | ICD-10-CM | POA: Diagnosis not present

## 2020-02-18 LAB — CULTURE, RESPIRATORY W GRAM STAIN

## 2020-02-18 LAB — GLUCOSE, CAPILLARY
Glucose-Capillary: 128 mg/dL — ABNORMAL HIGH (ref 70–99)
Glucose-Capillary: 134 mg/dL — ABNORMAL HIGH (ref 70–99)
Glucose-Capillary: 147 mg/dL — ABNORMAL HIGH (ref 70–99)
Glucose-Capillary: 105 mg/dL — ABNORMAL HIGH (ref 70–99)
Glucose-Capillary: 111 mg/dL — ABNORMAL HIGH (ref 70–99)
Glucose-Capillary: 142 mg/dL — ABNORMAL HIGH (ref 70–99)

## 2020-02-18 LAB — BASIC METABOLIC PANEL
Anion gap: 12 (ref 5–15)
BUN: 39 mg/dL — ABNORMAL HIGH (ref 6–20)
CO2: 26 mmol/L (ref 22–32)
Calcium: 9 mg/dL (ref 8.9–10.3)
Chloride: 107 mmol/L (ref 98–111)
Creatinine, Ser: 0.86 mg/dL (ref 0.61–1.24)
GFR, Estimated: >60 mL/min (ref >60)
Glucose, Bld: 103 mg/dL — ABNORMAL HIGH (ref 70–99)
Potassium: 3.9 mmol/L (ref 3.5–5.1)
Sodium: 145 mmol/L (ref 135–145)

## 2020-02-18 NOTE — Progress Notes (Signed)
NAME:  Shawn Ramsey, MRN:  [id number], DOB:  1980-06-11, LOS: 9 ADMISSION DATE:  02/09/2020, CONSULTATION DATE:  02/14/20 REFERRING MD:  Mariea Clonts, CHIEF COMPLAINT: Drowsiness and dyspnea  Brief History   39 y o M with OHS/OSA, asthma intubated for acute on chronic hypercapnic respiratory failure He apparently remained unresponsive on trial of NIV and so he was intubated there. Intubation in ED complicated by some posterior oropharyngeal trauma with bleeding noted there during anesthesia's attempt.   Past Medical History  Asthma OHS/OSA on home O2 3L not yet adherent to BiPAP, followed in Dr. Evlyn Courier clinic Diabetes HFpEF  Significant Hospital Events   12/19 intubated  12/19 admitted  Consults:  ENT  Procedures:  12/19 ETT placement 12/23 trach by ENT  Significant Diagnostic Tests:  02/09/20 CXR with RUL infiltrate vs atelectasis new post-intubation relative to pre-intubation, retrocardiac opacity  Micro Data:  12/20 MRSA screen positive Sputum 12/26>>  Antimicrobials:  Ceftriaxone 12/19 > 12/21 Azithromycin 12/19 > 12/21  Zosyn 12/25>>  Interim history/subjective:  Tolerated 2 hours TC yesterday. Denies pain  Objective   Blood pressure 126/67, pulse 92, temperature 98.4 F (36.9 C), temperature source Oral, resp. rate (!) 28, height 5\' 5"  (1.651 m), weight (!) 161.6 kg, SpO2 96 %.    Vent Mode: PRVC FiO2 (%):  [40 %] 40 % Set Rate:  [20 bmp] 20 bmp Vt Set:  [450 mL] 450 mL PEEP:  [5 cmH20-8 cmH20] 5 cmH20 Pressure Support:  [8 cmH20-12 cmH20] 12 cmH20 Plateau Pressure:  [22 cmH20] 22 cmH20   Intake/Output Summary (Last 24 hours) at 02/18/2020 02/20/2020 Last data filed at 02/18/2020 0600 Gross per 24 hour  Intake 4217.13 ml  Output 3785 ml  Net 432.13 ml   Filed Weights   02/16/20 0437 02/17/20 0350 02/18/20 0600  Weight: (!) 160.3 kg (!) 159 kg (!) 161.6 kg    Examination: Constitutional: obese man in NAD lying in bed  Eyes: EOMI, pupils equal Ears,  nose, mouth, and throat: trach in place with small thick secretions Cardiovascular: RRR, ext warm Respiratory: clear, no wheezing Gastrointestinal: soft, +BS Skin: No rashes, normal turgor Neurologic: moves all 4 ext to command Psychiatric: RASS 0   BUN/Cr up Uop not fully accounted for Sugars okay  Resolved Hospital Problem list    Acute metabolic encephalopathy due to hypercapnia  Assessment & Plan:  Acute on chronic hypoxic/hypercapnic respiratory failure status post trach 12/24 by ENT with difficult upper airway anatomy- weaning attempts complicated by mucus plugging Staph aureus tracheiitis vs. PNA- on linezolid pending MRSA vs. MSSA +staph (non aureus) blood culture- this is a contaminant Morbid obesity Diabetes type 2 Uncontrolled hypertension Chronic diastolic congestive heart failure Afib Severe anxiety Hypokalemia Acute kidney injury- improved after hydration  - Continue PTA eliquis - TC trials during day - Trach exchange by ENT only - Restart ACE-I at some point - Hold fluids and lasix today - On scopolamine patch as trial - 7 days abx therapy (ceftriaxone vs. linezolid depending on staph sensitivities) - Up to chair  Best practice:  Diet: NPO, tube feeds Pain/Anxiety/Delirium protocol (if indicated): Klonopin as needed VAP protocol (if indicated): yes DVT prophylaxis: lovenox GI prophylaxis: famotidine Glucose control: SSI Mobility: As tolerated Code Status: Full  Family Communication: Patient's mother was updated at bedside Goals of care discussion: 12/22 carried out, opted for aggressive care, keeping CODE STATUS as full.  Next goals of care discussion: 12/29 Disposition: vent progressive bed, will sign patient out to [facility] once  out of ICU   Erskine Emery MD [facility] 02/18/2020 7:04 AM Personal pager: [phone] If unanswered, please page CCM On-call: [phone]

## 2020-02-18 NOTE — Progress Notes (Signed)
RT called to room for low sats. Pts sats were in the low 70's. Pt immediately placed on full vent support and O2 was increased to 100%. Pts trach was lavaged and suctioned for small amount of pink tinged secretions.  Sats increased to 95%. Pt tolerated 3hrs of ATC.

## 2020-02-18 NOTE — Progress Notes (Signed)
Pt placed on 40% ATC and is tolerating well at this time. RN aware. RT to continue to monitor. 

## 2020-02-19 DIAGNOSIS — J9621 Acute and chronic respiratory failure with hypoxia: Secondary | ICD-10-CM | POA: Diagnosis not present

## 2020-02-19 LAB — GLUCOSE, CAPILLARY
Glucose-Capillary: 113 mg/dL — ABNORMAL HIGH (ref 70–99)
Glucose-Capillary: 125 mg/dL — ABNORMAL HIGH (ref 70–99)
Glucose-Capillary: 119 mg/dL — ABNORMAL HIGH (ref 70–99)
Glucose-Capillary: 116 mg/dL — ABNORMAL HIGH (ref 70–99)
Glucose-Capillary: 114 mg/dL — ABNORMAL HIGH (ref 70–99)
Glucose-Capillary: 130 mg/dL — ABNORMAL HIGH (ref 70–99)

## 2020-02-19 LAB — CULTURE, BLOOD (ROUTINE X 2): Special Requests: ADEQUATE

## 2020-02-19 LAB — BASIC METABOLIC PANEL
Anion gap: 10 (ref 5–15)
BUN: 24 mg/dL — ABNORMAL HIGH (ref 6–20)
CO2: 29 mmol/L (ref 22–32)
Calcium: 9.3 mg/dL (ref 8.9–10.3)
Chloride: 104 mmol/L (ref 98–111)
Creatinine, Ser: 0.71 mg/dL (ref 0.61–1.24)
GFR, Estimated: >60 mL/min (ref >60)
Glucose, Bld: 125 mg/dL — ABNORMAL HIGH (ref 70–99)
Potassium: 3.7 mmol/L (ref 3.5–5.1)
Sodium: 143 mmol/L (ref 135–145)

## 2020-02-19 MED ORDER — PROSOURCE TF PO LIQD
45.0000 mL | Freq: Four times a day (QID) | ORAL | Status: DC
  Administered 2020-02-19 – 2020-02-22 (×14): 45 mL
  Filled 2020-02-19 (×10): qty 45

## 2020-02-19 MED ORDER — JEVITY 1.5 CAL/FIBER PO LIQD
1000.0000 mL | ORAL | Status: DC
  Administered 2020-02-19 – 2020-02-22 (×4): 1000 mL
  Filled 2020-02-19 (×7): qty 1000

## 2020-02-19 NOTE — Progress Notes (Signed)
Nutrition Follow-up  DOCUMENTATION CODES:   Morbid obesity  INTERVENTION:   Continue TF via Cortrak tube (change formula to better meet re-estimated needs): Jevity 1.5 at 60 ml/h (1440 ml per day) Prosource TF 45 ml QID  Provides 2320 kcal, 136 gm protein, 1094 ml free water daily  NUTRITION DIAGNOSIS:   Inadequate oral intake related to inability to eat as evidenced by NPO status.  Ongoing  GOAL:   Patient will meet greater than or equal to 90% of their needs   Met  MONITOR:   TF tolerance,Vent status,Diet advancement,Labs  REASON FOR ASSESSMENT:   Ventilator,Consult Enteral/tube feeding initiation and management  ASSESSMENT:   39 yo male admitted with acute on chronic hypercapnic respiratory failure. Required intubation in the ED. PMH includes OHS/OSA (home oxygen 3 L), DM, HF, asthma, COPD, cognitive developmental delay, psychotic disorder.   Discussed patient in ICU rounds and with RN today. Patient has been on trach collar since 7am today.  Cortrak tube was placed on 12/24, tip is in the stomach.  Currently receiving Vital High Protein at 60 ml/h via Cortrak with Prosource TF 45 ml BID to provide 1520 kcal, 148 gm protein, 1204 ml free water daily. Free water flushes, 200 ml every 4 hours.  Currently NPO.  SLP has been consulted for potential initiation of POs.  Labs reviewed.  CBG: [phone]  Medications reviewed and include Colace, Novolog SSI, Miralax, Senokot.  Weight 162.5 kg today; 172.6 kg on 12/20.  I/O +605 ml since admission  Diet Order:   Diet Order    None      EDUCATION NEEDS:   Not appropriate for education at this time  Skin:  Skin Assessment: Reviewed RN Assessment  Last BM:  12/29 type 7  Height:   Ht Readings from Last 1 Encounters:  02/10/20 5' 5"  (1.651 m)    Weight:   Wt Readings from Last 1 Encounters:  02/19/20 (!) 162.5 kg    Ideal Body Weight:  61.8 kg  BMI:  Body mass index is 59.62  kg/m.  Estimated Nutritional Needs:   Kcal:  8119-1478  Protein:  130-160 gm  Fluid:  >/= 2.2 L    Lucas Mallow, RD, LDN, CNSC Please refer to Amion for contact information.

## 2020-02-19 NOTE — Addendum Note (Signed)
Addendum  created 02/19/20 1007 by Arta Bruce, MD   Attestation recorded in Intraprocedure, Intraprocedure Attestations filed

## 2020-02-19 NOTE — TOC Initial Note (Signed)
Transition of Care [facility]) - Initial/Assessment Note    Patient Details  Name: Shawn Ramsey MRN: [id number] Date of Birth: 1980/03/22  [facility] CM/SW Contact:    Lawerance Sabal, RN Phone Number: 02/19/2020, 12:00 PM  Clinical Narrative:                 [name] w patient and mom, [name] at bedside.  Patient comfortable on TC, gradually increasing tolerance. Patient lives alone, across the street from his mother. She states that he will be able to initially stay with her after DC and hopefully assist with trach, however she will need teaching. CM encouraged to be at the hospital as much as possible and to watch and ask questions during suctioning and trach care. Mother's address: [address] [place] [address]  [facility] will follow for trach set up.   Expected Discharge Plan: Home w Home Health Services Barriers to Discharge: Continued Medical Work up   Patient Goals and CMS Choice Patient states their goals for this hospitalization and ongoing recovery are:: to go home      Expected Discharge Plan and Services Expected Discharge Plan: Home w Home Health Services                                              Prior Living Arrangements/Services                       Activities of Daily Living      Permission Sought/Granted                  Emotional Assessment              Admission diagnosis:  Acute respiratory failure with hypercapnia (HCC) [J96.02] Endotracheally intubated [Z97.8] Acute on chronic respiratory failure (HCC) [J96.20] Patient Active Problem List   Diagnosis Date Noted  . Acute on chronic respiratory failure (HCC) 02/09/2020  . Acute respiratory failure with hypercapnia (HCC) 02/09/2020  . Alcohol consumption binge drinking 04/10/2019  . Chronic respiratory failure (HCC) 11/14/2018  . Hypoxia 09/24/2018  . Dyspnea and respiratory abnormalities 09/24/2018  . Nocturnal hypoxia 04/15/2018  . Vitamin D deficiency  09/15/2017  . Medical non-compliance 09/07/2016  . Cigarette nicotine dependence 05/15/2016  . Essential hypertension 02/04/2016  . Chronic venous insufficiency 04/17/2015  . Leg pain, anterior, left 11/03/2014  . Diabetes mellitus type 2 in obese (HCC) 11/03/2014  . Dyslipidemia 11/03/2014  . OSA (obstructive sleep apnea) 03/15/2014  . Metabolic syndrome X 09/16/2012  . NICOTINE ADDICTION 09/21/2008  . Morbid obesity with BMI of 60.0-69.9, adult (HCC) 09/08/2008  . COUGH VARIANT ASTHMA 09/08/2008   PCP:  Kerri Perches, MD Pharmacy:   [facility] [address] [place] - [address] [place] [address] Phone: [phone] Fax: [phone]     Social Determinants of Health (SDOH) Interventions    Readmission Risk Interventions No flowsheet data found.

## 2020-02-19 NOTE — Progress Notes (Addendum)
02/19/2020   I have seen and evaluated the patient for respiratory failure.  S:  No events, tolerated TC for a bit yesterday. Copious secretions ongoing. Does not want to get out of bed.  O: Blood pressure (!) 162/83, pulse 98, temperature 98.6 F (37 C), temperature source Oral, resp. rate (!) 24, height 5\' 5"  (1.651 m), weight (!) 162.5 kg, SpO2 98 %.  No acute distress on TC Whitish thick copious secretions Able to talk around tracheostomy Lungs with transmitted upper airway sounds Ext with equivocal edema  A:  -Recurrent hypoxemic/hypercapneic respiratory failure with difficult upper airway anatomy necessitating trach. -MRSA tracheiitis vs. PNA -Class 3 obesity, OSA/OHS - Muscular deconditioning  P:  - Linezolid x 7 days - TC, suctioning PRN - Trach changes with ENT only given difficult anatomy - PT/OT consults - In for vent bed, can go to any progressive bed should he be able to remain off vent for 24h  02/19/2020 02/21/2020 MD      NAME:  Myrla Halsted, MRN:  [name], DOB:  21-Sep-1980, LOS: 10 ADMISSION DATE:  02/09/2020, CONSULTATION DATE:  02/14/20 REFERRING MD:  02/16/20, CHIEF COMPLAINT: Drowsiness and dyspnea  Brief History   39 y o M with OHS/OSA, asthma intubated for acute on chronic hypercapnic respiratory failure  He apparently remained unresponsive on trial of NIV and so he was intubated there. Intubation in ED complicated by some posterior oropharyngeal trauma with bleeding noted there during anesthesia's attempt.   Past Medical History  Asthma OHS/OSA on home O2 3L not yet adherent to BiPAP, followed in Dr. 24 clinic Diabetes HFpEF  Significant Hospital Events   12/19 intubated  12/19 admitted  Consults:  ENT  Procedures:  12/19 ETT placement 12/23 trach by ENT  Significant Diagnostic Tests:  02/09/20 CXR with RUL infiltrate vs atelectasis new post-intubation relative to pre-intubation, retrocardiac opacity  Micro Data:   MRSA screen 12/20 > positive Sputum 12/26>>  Antimicrobials:  Ceftriaxone 12/19 > 12/21 Azithromycin 12/19 > 12/21  Zosyn 12/25>>  Interim history/subjective:  Sitting up in bed after being changed to ATC Denies any acute complaints  States trach area "feels tight  No acute events overnight   Objective   Blood pressure (!) 146/68, pulse 95, temperature 98.6 F (37 C), temperature source Oral, resp. rate (!) 25, height 5\' 5"  (1.651 m), weight (!) 162.5 kg, SpO2 98 %.    Vent Mode: PRVC FiO2 (%):  [40 %-70 %] 40 % Set Rate:  [20 bmp] 20 bmp Vt Set:  [450 mL] 450 mL PEEP:  [5 cmH20] 5 cmH20 Pressure Support:  [5 cmH20] 5 cmH20 Plateau Pressure:  [23 cmH20-25 cmH20] 23 cmH20   Intake/Output Summary (Last 24 hours) at 02/19/2020 Last data filed at 02/19/2020 0600 Gross per 24 hour  Intake 2380 ml  Output 2300 ml  Net 80 ml   Filed Weights   02/17/20 0350 02/18/20 0600 02/19/20 0346  Weight: (!) 159 kg (!) 161.6 kg (!) 162.5 kg    Examination: General: Morbidly obese adult male lying in bed on ATC in NAD HEENT: 7 cuffed Bovian trach midline, MM pink/moist, PERRL,  Neuro: Alert and oriented x3, non-focal  CV: s1s2 regular rate and rhythm, no murmur, rubs, or gallops,  PULM:  Slight rhonchi bilaterally, improved with encouraged cough, tolerating secretions well GI: soft, bowel sounds active in all 4 quadrants, obese non-tender, non-distended, tolerating TF Extremities: warm/dry, no edema  Skin: no rashes or lesions  Resolved Hospital Problem list  Acute metabolic encephalopathy due to hypercapnia Hypokalemia  AKI  Assessment & Plan:  Acute on chronic hypoxic/hypercapnic respiratory failure status post trach 12/24 by ENT with difficult upper airway anatomy -weaning attempts complicated by mucus plugging -He is able to phonate without PMV P: Continue ventilator support with lung protective strategies at night  Continue ATC trials during the day  Routine  routine trach care  Trach to be changed by ENT ONLY Wean PEEP and FiO2 for sats greater than 90%. Head of bed elevated 30 degrees Follow intermittent chest x-ray and ABG   Ensure adequate pulmonary hygiene  Follow cultures  VAP bundle in place  PAD protocol Continue scopolamine patch   MRSA tracheiitis vs. PNA - Respiratory culture positive 12/28, on linezolid  +Staph (non aureus) blood culture - this is a contaminant P: Continue Linezolid per pharmacy  Plan for 7 days of antibiotic therapy  ID consult for positive MRSA culture Follow fever curve and WBC  Morbid obesity -BMI 59.62 P: Dietitian following Continue TF's Protein supplementation   Diabetes type 2 -Home medications include metformin  P: Continue SSI Q4hr CBG  Uncontrolled hypertension Hyperlipidemia  -Home medications include demedex and lipitor P: Blood pressure currently well controlled Vitals per unit protocol  Daily assessment for need of diuretics  Strict I&O Continue home Lipitor   Chronic diastolic congestive heart failure -EF of 60-65%, Follow[facility] by [facility]  New onset Afib -Chart review reveals no known history of A-fib or chronic anticoagulations  P: Continue PO Eliquis  Continuous telemetry  Strict I&O  Monitor volume status  Continue Coreg   Severe anxiety P: Continue PRN Klonipin    Best practice:  Diet: NPO, tube feeds Pain/Anxiety/Delirium protocol (if indicated): Klonopin as needed  VAP protocol (if indicated): In place  DVT prophylaxis: lovenox GI prophylaxis: famotidine Glucose control: SSI Mobility: As tolerated Code Status: Full  Family Communication: Patient's mother was updated at bedside Goals of care discussion: 12/22 carried out, opted for aggressive care, keeping CODE STATUS as full.  Next goals of care discussion: 12/29 Disposition: vent progressive bed, will sign patient out to [facility] once out of ICU   CRITICAL CARE Performed by: Johnsie Cancel  Total  critical care time: 38 minutes  Critical care time was exclusive of separately billable procedures and treating other patients.  Critical care was necessary to treat or prevent imminent or life-threatening deterioration.  Critical care was time spent personally by me on the following activities: development of treatment plan with patient and/or surrogate as well as nursing, discussions with consultants, evaluation of patient's response to treatment, examination of patient, obtaining history from patient or surrogate, ordering and performing treatments and interventions, ordering and review of laboratory studies, ordering and review of radiographic studies, pulse oximetry and re-evaluation of patient's condition.  Johnsie Cancel, NP-C [facility] Contact / Pager information can be found on Amion  02/19/2020, 7:30 AM

## 2020-02-20 DIAGNOSIS — J9602 Acute respiratory failure with hypercapnia: Secondary | ICD-10-CM | POA: Diagnosis not present

## 2020-02-20 LAB — BASIC METABOLIC PANEL
Anion gap: 11 (ref 5–15)
BUN: 18 mg/dL (ref 6–20)
CO2: 31 mmol/L (ref 22–32)
Calcium: 9.3 mg/dL (ref 8.9–10.3)
Chloride: 104 mmol/L (ref 98–111)
Creatinine, Ser: 0.61 mg/dL (ref 0.61–1.24)
GFR, Estimated: >60 mL/min (ref >60)
Glucose, Bld: 128 mg/dL — ABNORMAL HIGH (ref 70–99)
Potassium: 3.7 mmol/L (ref 3.5–5.1)
Sodium: 146 mmol/L — ABNORMAL HIGH (ref 135–145)

## 2020-02-20 LAB — GLUCOSE, CAPILLARY
Glucose-Capillary: 94 mg/dL (ref 70–99)
Glucose-Capillary: 115 mg/dL — ABNORMAL HIGH (ref 70–99)
Glucose-Capillary: 157 mg/dL — ABNORMAL HIGH (ref 70–99)
Glucose-Capillary: 129 mg/dL — ABNORMAL HIGH (ref 70–99)
Glucose-Capillary: 115 mg/dL — ABNORMAL HIGH (ref 70–99)
Glucose-Capillary: 144 mg/dL — ABNORMAL HIGH (ref 70–99)

## 2020-02-20 LAB — MAGNESIUM: Magnesium: 2.3 mg/dL (ref 1.7–2.4)

## 2020-02-20 MED ORDER — DEXMEDETOMIDINE HCL IN NACL 400 MCG/100ML IV SOLN
0.2000 ug/kg/h | INTRAVENOUS | Status: DC
  Administered 2020-02-20: 0.4 ug/kg/h via INTRAVENOUS
  Administered 2020-02-21: 0.5 ug/kg/h via INTRAVENOUS
  Filled 2020-02-20 (×2): qty 100

## 2020-02-20 MED ORDER — FREE WATER
250.0000 mL | Status: DC
  Administered 2020-02-20 – 2020-02-23 (×20): 250 mL

## 2020-02-20 NOTE — Progress Notes (Signed)
Attempted ATC Pt did not tolerate.  SPO2 dropped to 89% with increased WOB.  Placed back on vent SPO2 94%

## 2020-02-20 NOTE — Progress Notes (Signed)
eLink Physician-Brief Progress Note Patient Name: [name] DOB: [birth year] MRN: [id number]   Date of Service  02/20/2020  HPI/Events of Note  Patient had 10 beat run of NSVT. Now back in NSR.   eICU Interventions  Plan: 1. BMP and Mg++ level STAT.      Intervention Category Major Interventions: Arrhythmia - evaluation and management  Devinn Hurwitz Eugene 02/20/2020, 4:32 AM

## 2020-02-20 NOTE — Progress Notes (Signed)
SLP Cancellation Note  Patient Details Name: Shawn Ramsey MRN: [id number] DOB: [birth year]   Cancelled treatment:       Reason Eval/Treat Not Completed: Patient not medically ready (Pt is currently still on the vent, but RN is hopeful that he will be able to transition to ATC this afternoon and she will contact this SLP accordingly.)  Zoe Creasman I. Vear Clock, MS, CCC-SLP Acute Rehabilitation Services Office number [phone] Pager [phone]  Scheryl Marten 02/20/2020, 8:58 AM
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
## 2020-02-20 NOTE — Progress Notes (Signed)
eLink Physician-Brief Progress Note Patient Name: Shawn Ramsey DOB: 1980-10-20 MRN: [id number]   Date of Service  02/20/2020  HPI/Events of Note  Patient is s/p recent tracheostomy, he is on the ventilator without any sedation and has been very anxious.   eICU Interventions  Precedex ordered for moderate sedation and anxiolysis without respiratory depression.        [name] 02/20/2020, 8:55 PM

## 2020-02-20 NOTE — Progress Notes (Signed)
NAME:  Shawn Ramsey, MRN:  [id number], DOB:  10-May-1980, LOS: 11 ADMISSION DATE:  02/09/2020, CONSULTATION DATE:  02/14/20 REFERRING MD:  Mariea Clonts, CHIEF COMPLAINT: Drowsiness and dyspnea  Brief History   39 y o M with OHS/OSA, asthma intubated for acute on chronic hypercapnic respiratory failure  He apparently remained unresponsive on trial of NIV and so he was intubated there. Intubation in ED complicated by some posterior oropharyngeal trauma with bleeding noted there during anesthesia's attempt.   Past Medical History  Asthma OHS/OSA on home O2 3L not yet adherent to BiPAP, followed in [facility] Diabetes HFpEF  Significant Hospital Events   12/19 intubated  12/19 admitted  Consults:  ENT  Procedures:  12/19 ETT placement 12/23 trach by ENT  Significant Diagnostic Tests:  02/09/20 CXR with RUL infiltrate vs atelectasis new post-intubation relative to pre-intubation, retrocardiac opacity  Micro Data:  MRSA screen 12/20 > positive Sputum 12/26>>  Antimicrobials:  Ceftriaxone 12/19 > 12/21 Azithromycin 12/19 > 12/21  Zosyn 12/25>>  Interim history/subjective:  Run of NSVT overnight, electrolytes WNL No other issues overnight  Remains on vent this AM  Objective   Blood pressure 139/75, pulse 86, temperature 98.2 F (36.8 C), temperature source Oral, resp. rate (!) 21, height 5\' 5"  (1.651 m), weight (!) 162.5 kg, SpO2 91 %.    Vent Mode: PRVC FiO2 (%):  [40 %-60 %] 60 % Set Rate:  [20 bmp] 20 bmp Vt Set:  [450 mL] 450 mL PEEP:  [5 cmH20] 5 cmH20 Plateau Pressure:  [25 cmH20-26 cmH20] 26 cmH20   Intake/Output Summary (Last 24 hours) at 02/20/2020 0355 Last data filed at 02/20/2020 0600 Gross per 24 hour  Intake 2446.86 ml  Output 2000 ml  Net 446.86 ml   Filed Weights   02/18/20 0600 02/19/20 0346 02/20/20 0424  Weight: (!) 161.6 kg (!) 162.5 kg (!) 162.5 kg    Examination: General: Obese adult male lying in bed on mechanical  ventilation, in NAD HEENT: 7 cuffed Bovian trach midline, MM pink/moist, PERRL,  Neuro: Sleepy but will arouse to verbal stimuli CV: s1s2 regular rate and rhythm, no murmur, rubs, or gallops,  PULM:  Mechanical breath sounds, no increased work of breathing GI: soft, bowel sounds active in all 4 quadrants, non-tender, non-distended, tolerating TF Extremities: warm/dry, non-pitting edema  Skin: no rashes or lesions  Resolved Hospital Problem list   Acute metabolic encephalopathy due to hypercapnia Hypokalemia  AKI  Assessment & Plan:  Acute on chronic hypoxic/hypercapnic respiratory failure status post trach 12/24 by ENT with difficult upper airway anatomy -weaning attempts complicated by mucus plugging -He is able to phonate without PMV P: Continue ventilator support with lung protective strategies at night Continue ATC trial during the day Wean PEEP and FiO2 for sats greater than 90%. Continue routine trach care  Trach to be changed by ENT ONLY Head of bed elevated 30 degrees. Plateau pressures less than 30 cm H20 Follow intermittent chest x-ray and ABG   Ensure adequate pulmonary hygiene  VAP bundle in place  PAD protocol Antibiotics per below   MRSA tracheiitis vs. PNA - Respiratory culture positive 12/28, on linezolid  +Staph (non aureus) blood culture - this is a contaminant P: Continue Linezolid x7 days stop date in place  Follow fever curve and WBC Pulmonary hygiene   Morbid obesity -BMI 59.62 P: Dietary following  Continue TF's Protein supplementation   Diabetes type 2 -Home medications include metformin  P: Continue SSI CBG well  controlled  Q4hr CBG  Uncontrolled hypertension Hyperlipidemia  -Home medications include demedex and lipitor P: Blood relatively well Vitals per protocol  Daily assessment for need of diuretic  Strict I&O Continue home Lipitor   Chronic diastolic congestive heart failure -EF of 60-65%, Follow[facility] by [facility]  New  onset Afib -Chart review reveals no known history of A-fib or chronic anticoagulations  P: Continue PO Eliquis  Continuous telemetry  Strict I&O  Monitor volume status  Continues to have great urine outout Continue Coreg  Severe anxiety P: PRN Klonopin   Best practice:  Diet: NPO, tube feeds Pain/Anxiety/Delirium protocol (if indicated): Klonopin as needed  VAP protocol (if indicated): In place  DVT prophylaxis: lovenox GI prophylaxis: famotidine Glucose control: SSI Mobility: As tolerated Code Status: Full  Family Communication: Patient's mother was updated at bedside Goals of care discussion: 12/22 carried out, opted for aggressive care, keeping CODE STATUS as full.  Next goals of care discussion: 12/29 Disposition: vent progressive bed, will sign patient out to [facility] once out of ICU   CRITICAL CARE Performed by: Delfin Gant  Total critical care time: 37 minutes  Critical care time was exclusive of separately billable procedures and treating other patients.  Critical care was necessary to treat or prevent imminent or life-threatening deterioration.  Critical care was time spent personally by me on the following activities: development of treatment plan with patient and/or surrogate as well as nursing, discussions with consultants, evaluation of patient's response to treatment, examination of patient, obtaining history from patient or surrogate, ordering and performing treatments and interventions, ordering and review of laboratory studies, ordering and review of radiographic studies, pulse oximetry and re-evaluation of patient's condition.  Delfin Gant, NP-C [facility] Contact / Pager information can be found on Amion  02/20/2020, 7:07 AM

## 2020-02-21 ENCOUNTER — Inpatient Hospital Stay (HOSPITAL_COMMUNITY): Payer: Medicaid Other

## 2020-02-21 DIAGNOSIS — J9621 Acute and chronic respiratory failure with hypoxia: Secondary | ICD-10-CM | POA: Diagnosis not present

## 2020-02-21 LAB — GLUCOSE, CAPILLARY
Glucose-Capillary: 145 mg/dL — ABNORMAL HIGH (ref 70–99)
Glucose-Capillary: 143 mg/dL — ABNORMAL HIGH (ref 70–99)
Glucose-Capillary: 84 mg/dL (ref 70–99)
Glucose-Capillary: 86 mg/dL (ref 70–99)
Glucose-Capillary: 106 mg/dL — ABNORMAL HIGH (ref 70–99)

## 2020-02-21 LAB — BASIC METABOLIC PANEL
Anion gap: 12 (ref 5–15)
BUN: 20 mg/dL (ref 6–20)
CO2: 30 mmol/L (ref 22–32)
Calcium: 9.4 mg/dL (ref 8.9–10.3)
Chloride: 103 mmol/L (ref 98–111)
Creatinine, Ser: 0.72 mg/dL (ref 0.61–1.24)
GFR, Estimated: >60 mL/min (ref >60)
Glucose, Bld: 122 mg/dL — ABNORMAL HIGH (ref 70–99)
Potassium: 4.2 mmol/L (ref 3.5–5.1)
Sodium: 145 mmol/L (ref 135–145)

## 2020-02-21 LAB — CBC
HCT: 38.7 % — ABNORMAL LOW (ref 39.0–52.0)
Hemoglobin: 11.2 g/dL — ABNORMAL LOW (ref 13.0–17.0)
MCH: 27.3 pg (ref 26.0–34.0)
MCHC: 28.9 g/dL — ABNORMAL LOW (ref 30.0–36.0)
MCV: 94.4 fL (ref 80.0–100.0)
Platelets: 390 10*3/uL (ref 150–400)
RBC: 4.1 MIL/uL — ABNORMAL LOW (ref 4.22–5.81)
RDW: 15 % (ref 11.5–15.5)
WBC: 14.1 10*3/uL — ABNORMAL HIGH (ref 4.0–10.5)
nRBC: 0 % (ref 0.0–0.2)

## 2020-02-21 LAB — CULTURE, BLOOD (ROUTINE X 2)
Culture: NO GROWTH
Special Requests: ADEQUATE

## 2020-02-21 MED ORDER — REVEFENACIN 175 MCG/3ML IN SOLN
175.0000 ug | Freq: Every day | RESPIRATORY_TRACT | Status: DC
  Administered 2020-02-21 – 2020-03-10 (×17): 175 ug via RESPIRATORY_TRACT
  Filled 2020-02-21 (×19): qty 3

## 2020-02-21 MED ORDER — CLONAZEPAM 0.5 MG PO TABS
0.5000 mg | ORAL_TABLET | Freq: Four times a day (QID) | ORAL | Status: DC | PRN
  Administered 2020-02-21 – 2020-02-22 (×3): 0.5 mg
  Filled 2020-02-21 (×3): qty 1

## 2020-02-21 MED ORDER — ACETAMINOPHEN 160 MG/5ML PO SOLN
650.0000 mg | Freq: Four times a day (QID) | ORAL | Status: DC | PRN
  Administered 2020-02-21 – 2020-02-22 (×5): 650 mg
  Filled 2020-02-21 (×5): qty 20.3

## 2020-02-21 MED ORDER — FENTANYL CITRATE (PF) 100 MCG/2ML IJ SOLN
INTRAMUSCULAR | Status: AC
  Filled 2020-02-21: qty 2

## 2020-02-21 MED ORDER — ETOMIDATE 2 MG/ML IV SOLN
20.0000 mg | Freq: Once | INTRAVENOUS | Status: AC
  Administered 2020-02-21: 20 mg via INTRAVENOUS

## 2020-02-21 MED ORDER — HYDROMORPHONE HCL 1 MG/ML IJ SOLN
1.0000 mg | INTRAMUSCULAR | Status: DC | PRN
  Administered 2020-02-21: 1 mg via INTRAVENOUS
  Filled 2020-02-21: qty 1

## 2020-02-21 MED ORDER — FENTANYL CITRATE (PF) 100 MCG/2ML IJ SOLN
100.0000 ug | Freq: Once | INTRAMUSCULAR | Status: AC
  Administered 2020-02-21: 100 ug via INTRAVENOUS

## 2020-02-21 MED ORDER — MIDAZOLAM HCL 2 MG/2ML IJ SOLN
2.0000 mg | Freq: Once | INTRAMUSCULAR | Status: AC
  Administered 2020-02-21: 2 mg via INTRAVENOUS

## 2020-02-21 MED ORDER — CLONAZEPAM 1 MG PO TABS
1.0000 mg | ORAL_TABLET | Freq: Two times a day (BID) | ORAL | Status: DC
  Administered 2020-02-21 – 2020-02-23 (×5): 1 mg
  Filled 2020-02-21 (×7): qty 1

## 2020-02-21 MED ORDER — GUAIFENESIN 100 MG/5ML PO SOLN
5.0000 mL | ORAL | Status: DC
  Administered 2020-02-21 (×4): 100 mg
  Filled 2020-02-21: qty 10
  Filled 2020-02-21 (×2): qty 5

## 2020-02-21 MED ORDER — QUETIAPINE FUMARATE 50 MG PO TABS
50.0000 mg | ORAL_TABLET | Freq: Every day | ORAL | Status: DC
  Administered 2020-02-21 – 2020-02-22 (×2): 50 mg via ORAL
  Filled 2020-02-21 (×2): qty 1

## 2020-02-21 MED ORDER — MIDAZOLAM HCL 2 MG/2ML IJ SOLN
INTRAMUSCULAR | Status: AC
  Filled 2020-02-21: qty 2

## 2020-02-21 MED ORDER — INFLUENZA VAC SPLIT QUAD 0.5 ML IM SUSY
0.5000 mL | PREFILLED_SYRINGE | INTRAMUSCULAR | Status: DC
  Filled 2020-02-21: qty 0.5

## 2020-02-21 MED ORDER — ETOMIDATE 2 MG/ML IV SOLN
INTRAVENOUS | Status: AC
  Filled 2020-02-21: qty 10

## 2020-02-21 NOTE — Progress Notes (Signed)
NAME:  Shawn Ramsey, MRN:  [id number], DOB:  [birth year], LOS: 12 ADMISSION DATE:  02/09/2020, CONSULTATION DATE:  02/14/20 REFERRING MD:  Mariea Clonts, CHIEF COMPLAINT: Drowsiness and dyspnea  Brief History   39 y o M with OHS/OSA, asthma intubated for acute on chronic hypercapnic respiratory failure  He apparently remained unresponsive on trial of NIV and so he was intubated there. Intubation in ED complicated by some posterior oropharyngeal trauma with bleeding noted there during anesthesia's attempt.   Past Medical History  Asthma OHS/OSA on home O2 3L not yet adherent to BiPAP, followed in [facility] clinic Diabetes HFpEF  Significant Hospital Events   12/19 intubated  12/19 admitted  Consults:  ENT  Procedures:  12/19 ETT placement 12/23 trach by ENT  Significant Diagnostic Tests:  02/09/20 CXR with RUL infiltrate vs atelectasis new post-intubation relative to pre-intubation, retrocardiac opacity  Micro Data:  MRSA screen 12/20 > positive Sputum 12/26>>  Antimicrobials:  Ceftriaxone 12/19 > 12/21 Azithromycin 12/19 > 12/21  Zosyn 12/25>>  Interim history/subjective:  Run of NSVT overnight, electrolytes WNL No other issues overnight  Remains on vent this AM  Objective   Blood pressure 126/77, pulse 66, temperature 98 F (36.7 C), temperature source Oral, resp. rate (!) 21, height 5\' 5"  (1.651 m), weight (!) 165.5 kg, SpO2 96 %.    Vent Mode: PRVC FiO2 (%):  [28 %-100 %] 50 % Set Rate:  [20 bmp] 20 bmp Vt Set:  [450 mL] 450 mL PEEP:  [5 cmH20] 5 cmH20 Plateau Pressure:  [22 cmH20-26 cmH20] 22 cmH20   Intake/Output Summary (Last 24 hours) at 02/21/2020 02/23/2020 Last data filed at 02/21/2020 0500 Gross per 24 hour  Intake 3500.11 ml  Output 750 ml  Net 2750.11 ml   Filed Weights   02/19/20 0346 02/20/20 0424 02/21/20 0319  Weight: (!) 162.5 kg (!) 162.5 kg (!) 165.5 kg    Examination: General: Obese adult male lying in bed on mechanical ventilation,  in NAD HEENT: 7 cuffed Bovian trach midline, MM pink/moist, PERRL,  Neuro: Sleepy but will arouse to verbal stimuli CV: s1s2 regular rate and rhythm, no murmur, rubs, or gallops,  PULM:  Mechanical breath sounds, no increased work of breathing GI: soft, bowel sounds active in all 4 quadrants, non-tender, non-distended, tolerating TF Extremities: warm/dry, non-pitting edema  Skin: no rashes or lesions  Resolved Hospital Problem list   Acute metabolic encephalopathy due to hypercapnia Hypokalemia  AKI  Assessment & Plan:  Acute on chronic hypoxic/hypercapnic respiratory failure status post trach 12/24 by ENT with difficult upper airway anatomy -weaning attempts complicated by mucus plugging, agitation/anxiety -He is able to phonate without PMV P: -Continue TC trials -Wean PEEP and FiO2 for sats greater than 90%. -Continue routine trach care  -Trach to be changed by ENT ONLY -Head of bed elevated 30 degrees. -VAP prevention bundle  MRSA tracheiitis vs. PNA - Respiratory culture positive 12/28, on linezolid  +Staph (non aureus) blood culture - this is a contaminant P: -Continue Linezolid x7 days stop date in place  -Follow fever curve and WBC -Pulmonary hygiene   Morbid obesity -BMI 59.62 P: -Dietary following  -Continue TF's -Protein supplementation   Muscular deconditioning P: - PT/OT, OOB to chair as tolerated  Diabetes type 2 -Home medications include metformin  P: -Continue SSI -CBG remain well controlled  -Q4hr CBG  Uncontrolled hypertension Hyperlipidemia  -Home medications include demedex and lipitor P: Continue home Lipitor    Acute metabolic encephalopathy- c/w delirium,  baseline severe anxiety disorder P: DC precedex Increase standing and PRN klonipin Add qHS seroquel  Disposition- work on [employer] otherwise will need LTACH  Best practice:  Diet: NPO, tube feeds Pain/Anxiety/Delirium protocol (if indicated): Klonopin as needed  VAP  protocol (if indicated): In place  DVT prophylaxis: lovenox GI prophylaxis: famotidine Glucose control: SSI Mobility: As tolerated Code Status: Full  Family Communication: Patient's mother was updated at bedside Goals of care discussion: 12/22 carried out, opted for aggressive care, keeping CODE STATUS as full.  Next goals of care discussion: 12/29 Disposition: vent progressive bed, will sign patient out to [facility] once out of ICU  Patient critically ill due to metabolic encephalopathy, respiratory faiure Interventions to address this today precedex weaning,  Risk of deterioration without these interventions is high  I personally spent 38 minutes providing critical care not including any separately billable procedures  Erskine Emery MD [facility] 02/21/2020 7:19 AM Personal pager: [phone] If unanswered, please page CCM On-call: [phone]
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
## 2020-02-21 NOTE — Procedures (Signed)
Bronchoscopy Procedure Note  Shawn Ramsey  [id number]  February 12, 1981  Date:02/21/20  Time:10:36 AM   Provider Performing:Shawn Ramsey Shawn Ramsey   Procedure(s):  Flexible Bronchoscopy [id number])  Indication(s) Trach positioning  Consent Risks of the procedure as well as the alternatives and risks of each were explained to the patient and/or caregiver.  Consent for the procedure was obtained and is signed in the bedside chart  Anesthesia Etomidate, versed, fentanyl   Time Out Verified patient identification, verified procedure, site/side was marked, verified correct patient position, special equipment/implants available, medications/allergies/relevant history reviewed, required imaging and test results available.   Sterile Technique Usual hand hygiene, masks, gowns, and gloves were used   Procedure Description Bronchoscope advanced through tracheostomy tube and into airway.  Airways were examined down to subsegmental level with findings noted below.    Findings:  Severe bronchiitis changes Trach in good position above carina after adjustable bivona advanced more distally on flange   Complications/Tolerance None; patient tolerated the procedure well. Chest X-ray is needed post procedure.   EBL Minimal   Specimen(s) None

## 2020-02-22 DIAGNOSIS — J9621 Acute and chronic respiratory failure with hypoxia: Secondary | ICD-10-CM | POA: Diagnosis not present

## 2020-02-22 LAB — CBC
HCT: 34.2 % — ABNORMAL LOW (ref 39.0–52.0)
Hemoglobin: 10.8 g/dL — ABNORMAL LOW (ref 13.0–17.0)
MCH: 28.1 pg (ref 26.0–34.0)
MCHC: 31.6 g/dL (ref 30.0–36.0)
MCV: 89.1 fL (ref 80.0–100.0)
Platelets: 361 10*3/uL (ref 150–400)
RBC: 3.84 MIL/uL — ABNORMAL LOW (ref 4.22–5.81)
RDW: 14.9 % (ref 11.5–15.5)
WBC: 12.9 10*3/uL — ABNORMAL HIGH (ref 4.0–10.5)
nRBC: 0 % (ref 0.0–0.2)

## 2020-02-22 LAB — GLUCOSE, CAPILLARY
Glucose-Capillary: 103 mg/dL — ABNORMAL HIGH (ref 70–99)
Glucose-Capillary: 117 mg/dL — ABNORMAL HIGH (ref 70–99)
Glucose-Capillary: 118 mg/dL — ABNORMAL HIGH (ref 70–99)
Glucose-Capillary: 120 mg/dL — ABNORMAL HIGH (ref 70–99)
Glucose-Capillary: 128 mg/dL — ABNORMAL HIGH (ref 70–99)
Glucose-Capillary: 131 mg/dL — ABNORMAL HIGH (ref 70–99)

## 2020-02-22 LAB — BASIC METABOLIC PANEL
Anion gap: 10 (ref 5–15)
BUN: 16 mg/dL (ref 6–20)
CO2: 32 mmol/L (ref 22–32)
Calcium: 8.9 mg/dL (ref 8.9–10.3)
Chloride: 101 mmol/L (ref 98–111)
Creatinine, Ser: 0.6 mg/dL — ABNORMAL LOW (ref 0.61–1.24)
GFR, Estimated: >60 mL/min (ref >60)
Glucose, Bld: 138 mg/dL — ABNORMAL HIGH (ref 70–99)
Potassium: 3.5 mmol/L (ref 3.5–5.1)
Sodium: 143 mmol/L (ref 135–145)

## 2020-02-22 MED ORDER — INFLUENZA VAC A&B SA ADJ QUAD 0.5 ML IM PRSY
0.5000 mL | PREFILLED_SYRINGE | INTRAMUSCULAR | Status: DC | PRN
  Filled 2020-02-22: qty 0.5

## 2020-02-22 MED ORDER — GUAIFENESIN 100 MG/5ML PO SOLN
5.0000 mL | ORAL | Status: DC
  Administered 2020-02-22 – 2020-02-23 (×8): 100 mg
  Filled 2020-02-22 (×8): qty 5

## 2020-02-22 MED ORDER — COVID-19 MRNA VACC (MODERNA) 100 MCG/0.5ML IM SUSP
0.5000 mL | Freq: Once | INTRAMUSCULAR | Status: DC
  Filled 2020-02-22: qty 0.5

## 2020-02-22 NOTE — Progress Notes (Signed)
  Speech Language Pathology Treatment: Shawn Ramsey Speaking valve  Patient Details Name: Shawn Ramsey MRN: [id number] DOB: 09/05/1980 Today's Date: 02/22/2020 Time: 1210-1230 SLP Time Calculation (min) (ACUTE ONLY): 20 min  Assessment / Plan / Recommendation Clinical Impression  Pt demonstrates excellent PMSV tolerance with no signs of retained air behind valve. Pt able to phonate clearly and encouraged to wear valve all waking hour to improve secretion management and for safety with Po intake. Will f/u for tolerance and education with family regarding PMSV use and care. Precautions and placement briefly introduced to mother today. She is hopeful for decannulation prior to d/c.   HPI HPI: Pt is a 40 y.o. male with PMH morbid obesity and sleep apnea. Pt was admitted with respiratory distress. Pt intubated on admission on 12/19 with complication of some posterior oropharyngeal trauma with bleeding noted there during anesthesia's attempt. Trach placed by ENT on 12/23. CXR 12/19: CXR with RUL infiltrate vs atelectasis new post-intubation relative to pre-intubation, retrocardiac opacity.      SLP Plan  Continue with current plan of care       Recommendations         Patient may use Passy-Muir Speech Valve: During PO intake/meals;During all waking hours (remove during sleep);During all therapies with supervision PMSV Supervision: Full MD: Please consider changing trach tube to : Cuffless         Follow up Recommendations: 24 hour supervision/assistance Plan: Continue with current plan of care       GO               Harlon Ditty, MA CCC-SLP  Acute Rehabilitation Services Pager [phone] Office [phone]  Claudine Mouton 02/22/2020, 1:23 PM

## 2020-02-22 NOTE — Plan of Care (Signed)

## 2020-02-22 NOTE — Progress Notes (Signed)
NAME:  Shawn Ramsey, MRN:  [id number], DOB:  05/23/1980, LOS: 13 ADMISSION DATE:  02/09/2020, CONSULTATION DATE:  02/14/20 REFERRING MD:  Mariea ClontsEmokpae, CHIEF COMPLAINT: Drowsiness and dyspnea  Brief History   40 y o M with OHS/OSA, asthma intubated for acute on chronic hypercapnic respiratory failure  He apparently remained unresponsive on trial of NIV and so he was intubated there. Intubation in ED complicated by some posterior oropharyngeal trauma with bleeding noted there during anesthesia's attempt.   Past Medical History  Asthma OHS/OSA on home O2 3L not yet adherent to BiPAP, followed in Dr. [facility] Diabetes HFpEF  Significant Hospital Events   12/19 intubated  12/19 admitted  Consults:  ENT  Procedures:  12/19 ETT placement 12/23 trach by ENT  Significant Diagnostic Tests:  02/09/20 CXR with RUL infiltrate vs atelectasis new post-intubation relative to pre-intubation, retrocardiac opacity  Micro Data:  MRSA screen 12/20 > positive Sputum 12/26>>  Antimicrobials:  Ceftriaxone 12/19 > 12/21 Azithromycin 12/19 > 12/21  Zosyn 12/25>>  Interim history/subjective:  No events. Denies pain. Able to cough up sputum better with guaifenesin.  Objective   Blood pressure 107/70, pulse 92, temperature 98.3 F (36.8 C), temperature source Oral, resp. rate 20, height 5\' 5"  (1.651 m), weight (!) 161 kg, SpO2 93 %.    Vent Mode: PRVC FiO2 (%):  [40 %] 40 % Set Rate:  [20 bmp] 20 bmp Vt Set:  [450 mL] 450 mL PEEP:  [5 cmH20] 5 cmH20   Intake/Output Summary (Last 24 hours) at 02/22/2020 [id number] Last data filed at 02/22/2020 0600 Gross per 24 hour  Intake 3552.31 ml  Output 1300 ml  Net 2252.31 ml   Filed Weights   02/20/20 0424 02/21/20 0319 02/22/20 0427  Weight: (!) 162.5 kg (!) 165.5 kg (!) 161 kg    Examination: Constitutional: no acute distress  Eyes: EOMI Ears, nose, mouth, and throat: Trach in place, small white secretions Cardiovascular: RRR, ext  warm Respiratory: diminished base Gastrointestinal: soft, +BS, cortrak in place Skin: No rashes, normal turgor Neurologic: moves all 4 ext to command Psychiatric: RASS 0      Resolved Hospital Problem list   Acute metabolic encephalopathy due to hypercapnia Hypokalemia  AKI  Assessment & Plan:  Acute on chronic hypoxic/hypercapnic respiratory failure status post trach 12/24 by ENT with difficult upper airway anatomy -weaning attempts complicated by mucus plugging, agitation/anxiety -He is able to phonate without PMV P: -Continue TC trials -Wean PEEP and FiO2 for sats greater than 90%. -Continue routine trach care  -Trach to be changed by ENT ONLY -Head of bed elevated 30 degrees. -VAP prevention bundle  MRSA tracheiitis vs. PNA P: -Continue Linezolid x7 days stop date in place   Morbid obesity -BMI 59.62 P: -Dietary following  -Continue TF's -Protein supplementation   Muscular deconditioning P: - PT/OT, OOB to chair as tolerated, would benefit from IP rehab once on TC  Diabetes type 2 No issues with sugars here, DC fingersticks  Uncontrolled hypertension- not an issue here Hyperlipidemia  -Home medications include demedex and lipitor P: Continue home Lipitor    Acute metabolic encephalopathy- resolved  Disposition- IP rehab once off vent  Best practice:  Diet: NPO, tube feeds Pain/Anxiety/Delirium protocol (if indicated): Klonopin as needed  VAP protocol (if indicated): In place  DVT prophylaxis: lovenox GI prophylaxis: famotidine Glucose control: off Mobility: As tolerated Code Status: Full  Family Communication: Patient's mother was updated at bedside Goals of care discussion: 12/22 carried out, opted  for aggressive care, keeping CODE STATUS as full.  Next goals of care discussion: N/A, patient is progressing Disposition: vent progressive bed, will sign patient out to [facility] once out of ICU  Myrla Halsted MD [facility] 02/22/2020  7:22 AM Personal pager: [phone] If unanswered, please page CCM On-call: #[phone]
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
## 2020-02-23 DIAGNOSIS — J9621 Acute and chronic respiratory failure with hypoxia: Secondary | ICD-10-CM | POA: Diagnosis not present

## 2020-02-23 LAB — GLUCOSE, CAPILLARY: Glucose-Capillary: 128 mg/dL — ABNORMAL HIGH (ref 70–99)

## 2020-02-23 MED ORDER — ACETAMINOPHEN 160 MG/5ML PO SOLN
650.0000 mg | Freq: Four times a day (QID) | ORAL | Status: DC | PRN
  Administered 2020-02-24: 650 mg via ORAL
  Filled 2020-02-23: qty 20.3

## 2020-02-23 MED ORDER — ATORVASTATIN CALCIUM 40 MG PO TABS
40.0000 mg | ORAL_TABLET | Freq: Every day | ORAL | Status: DC
  Administered 2020-02-24: 40 mg via ORAL
  Filled 2020-02-23: qty 1

## 2020-02-23 MED ORDER — SENNOSIDES 8.8 MG/5ML PO SYRP
5.0000 mL | ORAL_SOLUTION | Freq: Two times a day (BID) | ORAL | Status: DC
  Administered 2020-02-23: 5 mL via ORAL
  Filled 2020-02-23: qty 5

## 2020-02-23 MED ORDER — QUETIAPINE FUMARATE 50 MG PO TABS: 50.0000 mg | ORAL_TABLET | Freq: Every day | ORAL | Status: DC

## 2020-02-23 MED ORDER — CLONAZEPAM 1 MG PO TABS
1.0000 mg | ORAL_TABLET | Freq: Two times a day (BID) | ORAL | Status: DC
  Administered 2020-02-24 – 2020-02-26 (×2): 1 mg via ORAL
  Filled 2020-02-23 (×3): qty 1

## 2020-02-23 MED ORDER — GUAIFENESIN 100 MG/5ML PO SOLN
5.0000 mL | ORAL | Status: DC
  Administered 2020-02-23 – 2020-02-24 (×6): 100 mg via ORAL
  Filled 2020-02-23 (×6): qty 5

## 2020-02-23 MED ORDER — CLONAZEPAM 0.5 MG PO TABS
0.5000 mg | ORAL_TABLET | Freq: Four times a day (QID) | ORAL | Status: DC | PRN
  Administered 2020-02-23 – 2020-02-24 (×3): 0.5 mg via ORAL
  Filled 2020-02-23 (×3): qty 1

## 2020-02-23 MED ORDER — POLYETHYLENE GLYCOL 3350 17 G PO PACK: 17.0000 g | PACK | Freq: Every day | ORAL | Status: DC | PRN

## 2020-02-23 MED ORDER — DOCUSATE SODIUM 50 MG/5ML PO LIQD
100.0000 mg | Freq: Two times a day (BID) | ORAL | Status: DC
  Administered 2020-02-23 – 2020-02-24 (×2): 100 mg via ORAL
  Filled 2020-02-23 (×2): qty 10

## 2020-02-23 MED ORDER — NYSTATIN 100000 UNIT/ML MT SUSP
5.0000 mL | Freq: Four times a day (QID) | OROMUCOSAL | Status: AC
  Administered 2020-02-23 – 2020-02-29 (×26): 500000 [IU] via OROMUCOSAL
  Filled 2020-02-23 (×25): qty 5

## 2020-02-23 MED ORDER — ORAL CARE MOUTH RINSE
15.0000 mL | Freq: Two times a day (BID) | OROMUCOSAL | Status: DC
  Administered 2020-02-23 – 2020-02-24 (×3): 15 mL via OROMUCOSAL

## 2020-02-23 MED ORDER — CHLORHEXIDINE GLUCONATE 0.12 % MT SOLN
15.0000 mL | Freq: Two times a day (BID) | OROMUCOSAL | Status: DC
  Administered 2020-02-23 – 2020-02-24 (×3): 15 mL via OROMUCOSAL
  Filled 2020-02-23: qty 15

## 2020-02-23 MED ORDER — CARVEDILOL 12.5 MG PO TABS
6.2500 mg | ORAL_TABLET | Freq: Two times a day (BID) | ORAL | Status: DC
  Administered 2020-02-23 – 2020-02-24 (×2): 6.25 mg via ORAL
  Filled 2020-02-23 (×2): qty 1

## 2020-02-23 MED ORDER — QUETIAPINE FUMARATE 50 MG PO TABS
50.0000 mg | ORAL_TABLET | Freq: Every day | ORAL | Status: DC
  Administered 2020-02-23: 50 mg via ORAL
  Filled 2020-02-23: qty 1

## 2020-02-23 MED ORDER — DOCUSATE SODIUM 50 MG/5ML PO LIQD: 100.0000 mg | Freq: Two times a day (BID) | ORAL | Status: DC | PRN

## 2020-02-23 MED ORDER — POLYETHYLENE GLYCOL 3350 17 G PO PACK: 17.0000 g | PACK | Freq: Every day | ORAL | Status: DC

## 2020-02-23 NOTE — Progress Notes (Signed)
Assessed midline per IV team consult.  Dressing not intact, catheter straightened and flushed easily with blood return.  Recommended dressing change to April RN and statlock provided.

## 2020-02-23 NOTE — Progress Notes (Signed)
NAME:  Shawn Ramsey, MRN:  [id number], DOB:  1980/10/23, LOS: 77 ADMISSION DATE:  02/09/2020, CONSULTATION DATE:  12/24 REFERRING MD:  Denton Brick, CHIEF COMPLAINT:  Drowsiness/dyspnea   Brief History:  40 y/o male with OHS/OSA and asthma intubated for acute on chronic hypercapnic respiratory failure.  Failed NIMV in ED, some oropharyngeal trauma on intubation attempt.  Past Medical History:  Asthma OHS/OSA on home O2 3L not yet adherent to BiPAP, followed in Dr. Juanetta Gosling clinic Diabetes HFpEF  [place] [facility] Events:  12/19 intubated  12/19 admitted  Consults:  ENT  Procedures:  12/19 ETT > 12/23 12/23 Trach >   Significant Diagnostic Tests:  02/09/20 CXR with RUL infiltrate vs atelectasis new post-intubation relative to pre-intubation, retrocardiac opacity  Micro Data:  MRSA screen 12/20 > positive Sputum 12/26>>  MRSA 12/20 blood > ngtd Blood 12/26 > staph caprae in 1/4 bottles  Antimicrobials:  Ceftriaxone 12/19 > 12/21 Azithromycin 12/19 > 12/21  Zosyn 12/25>> 12/26  Linezolid 12/27 >   Interim History / Subjective:   Off vent last night Needed to be lavaged  Objective   Blood pressure 134/75, pulse 94, temperature 98.4 F (36.9 C), temperature source Oral, resp. rate (!) 22, height 5\' 5"  (1.651 m), weight (!) 161 kg, SpO2 93 %.    Vent Mode: CPAP;PSV FiO2 (%):  [40 %-98 %] 98 % PEEP:  [5 cmH20] 5 cmH20 Pressure Support:  [16 cmH20] 16 cmH20   Intake/Output Summary (Last 24 hours) at 02/23/2020 0748 Last data filed at 02/23/2020 0406 Gross per 24 hour  Intake 2345.51 ml  Output 800 ml  Net 1545.51 ml   Filed Weights   02/20/20 0424 02/21/20 0319 02/22/20 0427  Weight: (!) 162.5 kg (!) 165.5 kg (!) 161 kg    Examination:  General:  Resting comfortably in chair HENT: NCAT tracheostomy intact PULM: CTA B, normal effort CV: RRR, no mgr GI: BS+, soft, nontender MSK: normal bulk and tone Neuro: awake, alert, no distress, MAEW   Resolved  Hospital Problem list     Assessment & Plan:  Acute on chronic hypoxemic respiratory failure Obesity hypoventilation syndrome Asthma S/p tracheostomy Difficult airway Trach to be changed by ENT only Trach collar 24 hours at this point Lavage prn VAP prevention bundle  MRSA tracheitis Linezolid x7 days  Morbid obesity Stop tube feeding Remove cor-trak  Muscular deconditioning PT working with patient  Uncontrolled hypertension Hyperlipidemia Statin to continue coreg  Acute metabolic encephalopathy seroquel at night Maintain clonazepam for now  [employer] (evaluated daily)  Diet: regular diet Pain/Anxiety/Delirium protocol (if indicated): minimize VAP protocol (if indicated): yes DVT prophylaxis: lovenox GI prophylaxis: d/c Glucose control: monitor Mobility: up ad lib Disposition:progressive bed with vent  Goals of Care:  Last date of multidisciplinary goals of care discussion: 12/22 Family and staff present: mother Summary of discussion: full scope of practice Follow up goals of care discussion due: n/a, he is improving Code Status: full  Labs   CBC: Recent Labs  Lab 02/21/20 0052 02/22/20 0801  WBC 14.1* 12.9*  HGB 11.2* 10.8*  HCT 38.7* 34.2*  MCV 94.4 89.1  PLT 390 [id number]    Basic Metabolic Panel: Recent Labs  Lab 02/18/20 0132 02/19/20 0011 02/20/20 0446 02/21/20 0052 02/22/20 0801  NA 145 143 146* 145 143  K 3.9 3.7 3.7 4.2 3.5  CL 107 104 104 103 101  CO2 26 29 31 30  32  GLUCOSE 103* 125* 128* 122* 138*  BUN 39* 24* 18 20  16  CREATININE 0.86 0.71 0.61 0.72 0.60*  CALCIUM 9.0 9.3 9.3 9.4 8.9  MG  --   --  2.3  --   --    GFR: Estimated Creatinine Clearance: 177.6 mL/min (A) (by C-G formula based on SCr of 0.6 mg/dL (L)). Recent Labs  Lab 02/21/20 0052 02/22/20 0801  WBC 14.1* 12.9*    Liver Function Tests: No results for input(s): AST, ALT, ALKPHOS, BILITOT, PROT, ALBUMIN in the last 168 hours. No results for input(s):  LIPASE, AMYLASE in the last 168 hours. No results for input(s): AMMONIA in the last 168 hours.  ABG    Component Value Date/Time   PHART 7.335 (L) 02/16/2020 0747   PCO2ART 63.2 (H) 02/16/2020 0747   PO2ART 82 (L) 02/16/2020 0747   HCO3 33.7 (H) 02/16/2020 0747   TCO2 36 (H) 02/16/2020 0747   ACIDBASEDEF 1.8 03/07/2014 0500   O2SAT 95.0 02/16/2020 0747     Coagulation Profile: No results for input(s): INR, PROTIME in the last 168 hours.  Cardiac Enzymes: No results for input(s): CKTOTAL, CKMB, CKMBINDEX, TROPONINI in the last 168 hours.  HbA1C: Hemoglobin A1C  Date/Time Value Ref Range Status  08/22/2019 04:15 PM 7.1 (A) 4.0 - 5.6 % Final   Hgb A1c MFr Bld  Date/Time Value Ref Range Status  01/03/2019 03:41 PM 6.7 (H) <5.7 % of total Hgb Final    Comment:    For someone without known diabetes, a hemoglobin A1c value of 6.5% or greater indicates that they may have  diabetes and this should be confirmed with a follow-up  test. . For someone with known diabetes, a value <7% indicates  that their diabetes is well controlled and a value  greater than or equal to 7% indicates suboptimal  control. A1c targets should be individualized based on  duration of diabetes, age, comorbid conditions, and  other considerations. . Currently, no consensus exists regarding use of hemoglobin A1c for diagnosis of diabetes for children. [name]   09/18/2018 09:25 AM 6.5 (H) <5.7 % of total Hgb Final    Comment:    For someone without known diabetes, a hemoglobin A1c value of 6.5% or greater indicates that they may have  diabetes and this should be confirmed with a follow-up  test. . For someone with known diabetes, a value <7% indicates  that their diabetes is well controlled and a value  greater than or equal to 7% indicates suboptimal  control. A1c targets should be individualized based on  duration of diabetes, age, comorbid conditions, and  other considerations. . Currently, no  consensus exists regarding use of hemoglobin A1c for diagnosis of diabetes for children. .     CBG: Recent Labs  Lab 02/22/20 0358 02/22/20 0723 02/22/20 1132 02/22/20 1619 02/22/20 1922  GLUCAP 103* 117* 128* 120* 118*    Critical care time: 35 minutes     Heber Myers Corner, MD [facility] [facility] Pager: [phone] Cell: [phone] If no response, call [phone]

## 2020-02-24 DIAGNOSIS — I10 Essential (primary) hypertension: Secondary | ICD-10-CM | POA: Diagnosis not present

## 2020-02-24 DIAGNOSIS — J398 Other specified diseases of upper respiratory tract: Secondary | ICD-10-CM | POA: Diagnosis not present

## 2020-02-24 DIAGNOSIS — E662 Morbid (severe) obesity with alveolar hypoventilation: Secondary | ICD-10-CM | POA: Diagnosis not present

## 2020-02-24 DIAGNOSIS — A4902 Methicillin resistant Staphylococcus aureus infection, unspecified site: Secondary | ICD-10-CM

## 2020-02-24 DIAGNOSIS — G9341 Metabolic encephalopathy: Secondary | ICD-10-CM | POA: Diagnosis not present

## 2020-02-24 DIAGNOSIS — J9622 Acute and chronic respiratory failure with hypercapnia: Secondary | ICD-10-CM | POA: Diagnosis not present

## 2020-02-24 DIAGNOSIS — Z930 Tracheostomy status: Secondary | ICD-10-CM | POA: Diagnosis not present

## 2020-02-24 DIAGNOSIS — J9621 Acute and chronic respiratory failure with hypoxia: Secondary | ICD-10-CM | POA: Diagnosis not present

## 2020-02-24 DIAGNOSIS — J9602 Acute respiratory failure with hypercapnia: Secondary | ICD-10-CM | POA: Diagnosis not present

## 2020-02-24 LAB — GLUCOSE, CAPILLARY
Glucose-Capillary: 122 mg/dL — ABNORMAL HIGH (ref 70–99)
Glucose-Capillary: 79 mg/dL (ref 70–99)
Glucose-Capillary: 94 mg/dL (ref 70–99)

## 2020-02-24 MED ORDER — CHLORHEXIDINE GLUCONATE 0.12% ORAL RINSE (MEDLINE KIT)
15.0000 mL | Freq: Two times a day (BID) | OROMUCOSAL | Status: DC
  Administered 2020-02-24 – 2020-03-01 (×13): 15 mL via OROMUCOSAL

## 2020-02-24 MED ORDER — FENTANYL CITRATE (PF) 100 MCG/2ML IJ SOLN
50.0000 ug | Freq: Once | INTRAMUSCULAR | Status: AC
  Administered 2020-02-24: 50 ug via INTRAVENOUS

## 2020-02-24 MED ORDER — ENSURE MAX PROTEIN PO LIQD
11.0000 [oz_av] | Freq: Two times a day (BID) | ORAL | Status: DC
  Filled 2020-02-24 (×7): qty 330

## 2020-02-24 MED ORDER — MIDAZOLAM HCL 2 MG/2ML IJ SOLN
INTRAMUSCULAR | Status: AC
  Filled 2020-02-24: qty 4

## 2020-02-24 MED ORDER — FENTANYL BOLUS VIA INFUSION
50.0000 ug | INTRAVENOUS | Status: DC | PRN
  Administered 2020-02-24 – 2020-02-26 (×6): 50 ug via INTRAVENOUS
  Filled 2020-02-24: qty 50

## 2020-02-24 MED ORDER — ETOMIDATE 2 MG/ML IV SOLN
INTRAVENOUS | Status: AC
  Filled 2020-02-24: qty 20

## 2020-02-24 MED ORDER — SENNA 8.6 MG PO TABS: 1.0000 | ORAL_TABLET | Freq: Two times a day (BID) | ORAL | Status: DC

## 2020-02-24 MED ORDER — FENTANYL CITRATE (PF) 100 MCG/2ML IJ SOLN
INTRAMUSCULAR | Status: AC
  Administered 2020-02-24: 50 ug
  Filled 2020-02-24: qty 2

## 2020-02-24 MED ORDER — ACETYLCYSTEINE 20 % IN SOLN
4.0000 mL | RESPIRATORY_TRACT | Status: DC
  Administered 2020-02-24: 4 mL via RESPIRATORY_TRACT
  Filled 2020-02-24 (×3): qty 4

## 2020-02-24 MED ORDER — ROCURONIUM BROMIDE 10 MG/ML (PF) SYRINGE
PREFILLED_SYRINGE | INTRAVENOUS | Status: AC
  Filled 2020-02-24: qty 10

## 2020-02-24 MED ORDER — ORAL CARE MOUTH RINSE
15.0000 mL | OROMUCOSAL | Status: DC
  Administered 2020-02-24 – 2020-03-02 (×61): 15 mL via OROMUCOSAL

## 2020-02-24 MED ORDER — FENTANYL 2500MCG IN NS 250ML (10MCG/ML) PREMIX INFUSION
50.0000 ug/h | INTRAVENOUS | Status: DC
  Administered 2020-02-24: 50 ug/h via INTRAVENOUS
  Filled 2020-02-24: qty 250

## 2020-02-24 MED ORDER — PANTOPRAZOLE SODIUM 40 MG PO PACK
40.0000 mg | PACK | Freq: Every day | ORAL | Status: DC
  Filled 2020-02-24 (×2): qty 20

## 2020-02-24 MED ORDER — DOCUSATE SODIUM 100 MG PO CAPS: 100.0000 mg | ORAL_CAPSULE | Freq: Two times a day (BID) | ORAL | Status: DC

## 2020-02-24 NOTE — Progress Notes (Signed)
VAST paged to code blue. Upon arrival at bedside, nurse reported patient had a respiratory issue and ENT was needed. Midline still working appropriately and no further IV access needed at this time.

## 2020-02-24 NOTE — Progress Notes (Signed)
Patient was put back to bed to be placed on the ventilator and plugged off. Bronch at the bedside and CCM performing. Will continue to monitor. Tamsen Meek, RN 02/24/2020 2:49 PM

## 2020-02-24 NOTE — Progress Notes (Signed)
  Speech Language Pathology Treatment: Shawn Ramsey Speaking valve  Patient Details Name: Shawn Ramsey MRN: [id number] DOB: 06/07/1980 Today's Date: 02/24/2020 Time: 2376-2831 SLP Time Calculation (min) (ACUTE ONLY): 11 min  Assessment / Plan / Recommendation Clinical Impression  Pt was alert and coopertive throughout the session. Pt's RN reported that the pt recently received anxiety medications.  Vitals were RR 20, SpO2 91, and HR 94 at baseline and cuff was deflated upon SLP's entry. Pt's PMSV for 8 minutes during this session. Vitals range was RR 23-26, SpO2 85-88, and HR 93-94 during this period. Pt's RN, April, was consulted to determine his baseline SpO2 and she advised that he tends to be 88-92% and questioned the impact of anxiety on his tolerance today. PMSV was removed due to pt's report of dyspnea and declining SpO2 to 84% which improved to 88% by the time of SLP's departure. Pt reports that he has otherwise been tolerating PMSV without difficulty. SLP will continue to follow pt.     HPI HPI: Pt is a 41 y.o. male with PMH morbid obesity and sleep apnea. Pt was admitted with respiratory distress. Pt intubated on admission on 12/19 with complication of some posterior oropharyngeal trauma with bleeding noted there during anesthesia's attempt. Trach placed by ENT on 12/23. CXR 12/19: CXR with RUL infiltrate vs atelectasis new post-intubation relative to pre-intubation, retrocardiac opacity.      SLP Plan  Continue with current plan of care       Recommendations         Patient may use Passy-Muir Speech Valve: During PO intake/meals;During all waking hours (remove during sleep);During all therapies with supervision PMSV Supervision: Full MD: Please consider changing trach tube to : Cuffless         Follow up Recommendations: 24 hour supervision/assistance Plan: Continue with current plan of care       Shawn Diaz I. Vear Clock, MS, CCC-SLP Acute Rehabilitation Services Office  number [phone] Pager [phone]                 Shawn Ramsey 02/24/2020, 11:00 AM

## 2020-02-24 NOTE — Procedures (Signed)
Bronchoscopy Procedure Note  Shawn Ramsey  [id number]  1980-09-10  Date:02/24/20  Time:5:13 PM   Provider Performing:Kacin Dancy S Shearon Stalls   Procedure(s):  Flexible Bronchoscopy [phone]) and Initial Therapeutic Aspiration of Tracheobronchial Tree [phone])  Indication(s) Desaturation, respiratory failure  Consent Unable to obtain consent due to emergent nature of procedure.  Anesthesia Fentanyl 50 mcg    Time Out Verified patient identification, verified procedure, site/side was marked, verified correct patient position, special equipment/implants available, medications/allergies/relevant history reviewed, required imaging and test results available.   Sterile Technique Usual hand hygiene, masks, gowns, and gloves were used   Procedure Description Bronchoscope advanced through tracheostomy tube and into airway.  Airways were examined down to subsegmental level with findings noted below.   Following diagnostic evaluation, Therapeutic aspiration performed in trachea  Findings: trace tracheal secretions. Greater than 50% collapse of posterior membrane with coughing which was the likely source for his desaturation and respiratory collapse.    Complications/Tolerance None; patient tolerated the procedure well. Chest X-ray is not needed post procedure.   EBL none   Specimen(s) none

## 2020-02-24 NOTE — Progress Notes (Signed)
PT Cancellation Note  Patient Details Name: Shawn Ramsey MRN: [id number] DOB: 05-13-1980   Cancelled Treatment:    Reason Eval/Treat Not Completed: Patient not medically ready (pt with SpO2 85% sitting in chair and discussed with RN as sats have not recovered after SLP and not appropriate to attempt mobility at present time)   Daren Doswell B [name] 02/24/2020, 11:45 AM Merryl Hacker, PT Acute Rehabilitation Services Pager: [phone] Office: [phone]

## 2020-02-24 NOTE — Progress Notes (Signed)
NAME:  Shawn Ramsey, MRN:  [id number], DOB:  1980-11-30, LOS: 15 ADMISSION DATE:  02/09/2020, CONSULTATION DATE:  12/24 REFERRING MD:  Mariea Clonts, CHIEF COMPLAINT:  Drowsiness/dyspnea   Brief History:  40 y/o male with OHS/OSA and asthma intubated for acute on chronic hypercapnic respiratory failure.  Failed NIMV in ED, some oropharyngeal trauma on intubation attempt.  Past Medical History:  Asthma OHS/OSA on home O2 3L not yet adherent to BiPAP, followed in Dr. Evlyn Courier clinic Diabetes HFpEF  Significant Hospital Events:  12/19 intubated  12/19 admitted  Consults:  ENT  Procedures:  12/19 ETT > 12/23 12/23 Trach >   Significant Diagnostic Tests:  02/09/20 CXR with RUL infiltrate vs atelectasis new post-intubation relative to pre-intubation, retrocardiac opacity  Micro Data:  MRSA screen 12/20 > positive Sputum 12/26>>  MRSA 12/20 blood > ngtd Blood 12/26 > staph caprae in 1/4 bottles  Antimicrobials:  Ceftriaxone 12/19 > 12/21.  Azithromycin 12/19 > 12/21. Zosyn 12/25 > 12/26. Linezolid 12/27 > 1/2.  Interim History / Subjective:  Stable on ATC 98%. Remains off vent since 12/31 AM. Has continued to require intermittent suctioning.  Objective   Blood pressure (!) 147/77, pulse 96, temperature 98.4 F (36.9 C), temperature source Oral, resp. rate 19, height 5\' 5"  (1.651 m), weight (!) 161 kg, SpO2 94 %.    Vent Mode: Stand-by;Other (Comment) FiO2 (%):  [60 %-90 %] 90 %   Intake/Output Summary (Last 24 hours) at 02/24/2020 0805 Last data filed at 02/24/2020 04/23/2020 Gross per 24 hour  Intake 786 ml  Output 700 ml  Net 86 ml   Filed Weights   02/20/20 0424 02/21/20 0319 02/22/20 0427  Weight: (!) 162.5 kg (!) 165.5 kg (!) 161 kg    Examination: General: Adult male, sitting up in chair, resting comfortably, in NAD. Neuro: A&O x 3, no deficits. HEENT: [place]/AT. Sclerae anicteric. Trach C/D/I. Cardiovascular: RRR, no M/R/G.  Lungs: Respirations even and unlabored.   Faint wheeze. Abdomen: BS x 4, soft, NT/ND.  Musculoskeletal: Obese.  No gross deformities, 1+ edema.  Skin: Intact, warm, no rashes.   Assessment & Plan:   Acute on chronic hypoxemic respiratory failure - s/p trach by ENT Obesity hypoventilation syndrome - s/p trach by ENT Asthma Difficult airway Trach to be changed by ENT only Continue ATC as tolerated, has remained off vent since AM 12/31 Lavage prn VAP prevention bundle Continue BD's  MRSA tracheitis - s/p 7 days linezolid. Bronchial hygiene.  Mucus plugging. Start Mucomyst nebs. Start Chest PT. Bronchial hygiene.  Morbid obesity Stop tube feeding Remove cor-trak  Muscular deconditioning PT working with patient  Uncontrolled hypertension Hyperlipidemia Continue Coreg, Atorvastatin.  Acute metabolic encephalopathy seroquel at night Maintain clonazepam for now  Stable for transfer to progressive once bed is available.  Will ask TRH to assume care starting in AM 1/4.  PCCM to follow intermittently for trach / asthma.  Best practice (evaluated daily)  Diet: regular diet Pain/Anxiety/Delirium protocol (if indicated): minimize VAP protocol (if indicated): yes DVT prophylaxis: lovenox GI prophylaxis: d/c Glucose control: monitor Mobility: up ad lib Disposition:progressive bed with vent  Goals of Care:  Last date of multidisciplinary goals of care discussion: 12/22 Family and staff present: mother Summary of discussion: full scope of practice Follow up goals of care discussion due: n/a, he is improving Code Status: full   1/23, PA [facility] For pager details, please see AMION 02/24/2020, 8:22 AM

## 2020-02-24 NOTE — Progress Notes (Signed)
OT Cancellation Note  Patient Details Name: Shawn Ramsey MRN: [id number] DOB: Jul 15, 1980   Cancelled Treatment:    Reason Eval/Treat Not Completed: Patient not medically ready (Pt's O2 sats are not improving >86% after SLP treatment;OT to continue to follow for OT eval.)  Pt requiring increased time for recovery in between therapy visits. OT to follow.  Flora Lipps, OTR/L Acute Rehabilitation Services Pager: [phone] Office: [phone]  Flora Lipps 02/24/2020, 11:48 AM

## 2020-02-24 NOTE — Progress Notes (Signed)
   02/24/20 1445  Clinical Encounter Type  Visited With Patient not available  Visit Type Code  Referral From Nurse  Consult/Referral To Chaplain  Chaplain responded to code. Chaplain is not needed at this time. Chaplain remains available if needed. This note was prepared by Deneen Harts, M.Div..  For questions please contact by phone [phone].

## 2020-02-24 NOTE — Progress Notes (Signed)
Nutrition Follow-up  DOCUMENTATION CODES:   Morbid obesity  INTERVENTION:    Ensure Max po BID, each supplement provides 150 kcal and 30 grams of protein.   NUTRITION DIAGNOSIS:   Increased nutrient needs related to chronic illness (COPD, HD) as evidenced by estimated needs.  Ongoing  GOAL:   Patient will meet greater than or equal to 90% of their needs   Met  MONITOR:   PO intake,Supplement acceptance  REASON FOR ASSESSMENT:   Ventilator,Consult Enteral/tube feeding initiation and management  ASSESSMENT:   40 yo male admitted with acute on chronic hypercapnic respiratory failure. Required intubation in the ED. PMH includes OHS/OSA (home oxygen 3 L), DM, HF, asthma, COPD, cognitive developmental delay, psychotic disorder.   Discussed patient in ICU rounds and with RN today. On trach collar since 12/31.  Diet advanced to regular 1/1 and Cortrak was removed 1/2. Patient is eating well, eating 100% of his meals.  He agreed to drink Ensure Max supplements 1-2 times per day to increase protein intake.   Labs reviewed.  Medications reviewed and include Colace, Miralax, Senokot.  Weight 161 kg today; 172.6 kg on 12/20.  Diet Order:   Diet Order            Diet regular Room service appropriate? Yes; Fluid consistency: Thin  Diet effective now                 EDUCATION NEEDS:   No education needs have been identified at this time  Skin:  Skin Assessment: Reviewed RN Assessment  Last BM:  1/3 type 4  Height:   Ht Readings from Last 1 Encounters:  02/10/20 5' 5"  (1.651 m)    Weight:   Wt Readings from Last 1 Encounters:  02/22/20 (!) 161 kg    Ideal Body Weight:  61.8 kg  BMI:  Body mass index is 59.07 kg/m.  Estimated Nutritional Needs:   Kcal:  2200-2400  Protein:  120-140 gm  Fluid:  >/= 2.2 L    Lucas Mallow, RD, LDN, CNSC Please refer to Amion for contact information.

## 2020-02-25 ENCOUNTER — Inpatient Hospital Stay (HOSPITAL_COMMUNITY): Payer: Medicaid Other

## 2020-02-25 DIAGNOSIS — G9340 Encephalopathy, unspecified: Secondary | ICD-10-CM

## 2020-02-25 DIAGNOSIS — Z930 Tracheostomy status: Secondary | ICD-10-CM

## 2020-02-25 DIAGNOSIS — J9621 Acute and chronic respiratory failure with hypoxia: Secondary | ICD-10-CM | POA: Diagnosis not present

## 2020-02-25 DIAGNOSIS — J398 Other specified diseases of upper respiratory tract: Secondary | ICD-10-CM | POA: Diagnosis not present

## 2020-02-25 LAB — GLUCOSE, CAPILLARY
Glucose-Capillary: 73 mg/dL (ref 70–99)
Glucose-Capillary: 89 mg/dL (ref 70–99)
Glucose-Capillary: 101 mg/dL — ABNORMAL HIGH (ref 70–99)
Glucose-Capillary: 84 mg/dL (ref 70–99)
Glucose-Capillary: 84 mg/dL (ref 70–99)
Glucose-Capillary: 83 mg/dL (ref 70–99)

## 2020-02-25 LAB — BRAIN NATRIURETIC PEPTIDE: B Natriuretic Peptide: 23.3 pg/mL (ref 0.0–100.0)

## 2020-02-25 MED ORDER — PANTOPRAZOLE SODIUM 40 MG PO TBEC: 40.0000 mg | DELAYED_RELEASE_TABLET | Freq: Every day | ORAL | Status: DC

## 2020-02-25 MED ORDER — DEXMEDETOMIDINE HCL IN NACL 400 MCG/100ML IV SOLN
0.4000 ug/kg/h | INTRAVENOUS | Status: DC
  Administered 2020-02-25: 0.5 ug/kg/h via INTRAVENOUS
  Filled 2020-02-25: qty 100

## 2020-02-25 MED ORDER — FENTANYL 2500MCG IN NS 250ML (10MCG/ML) PREMIX INFUSION
50.0000 ug/h | INTRAVENOUS | Status: DC
  Administered 2020-02-25 – 2020-02-27 (×3): 150 ug/h via INTRAVENOUS
  Filled 2020-02-25 (×3): qty 250

## 2020-02-25 MED ORDER — PANTOPRAZOLE SODIUM 40 MG IV SOLR
40.0000 mg | Freq: Every day | INTRAVENOUS | Status: DC
  Administered 2020-02-25: 40 mg via INTRAVENOUS
  Filled 2020-02-25: qty 40

## 2020-02-25 MED FILL — Medication: Qty: 1 | Status: AC

## 2020-02-25 NOTE — Progress Notes (Addendum)
NAME:  Shawn Ramsey, MRN:  [id number], DOB:  Sep 08, 1980, LOS: 16 ADMISSION DATE:  02/09/2020, CONSULTATION DATE:  12/24 REFERRING MD:  Mariea Clonts, CHIEF COMPLAINT:  Drowsiness/dyspnea   Brief History:  40 y/o male with OHS/OSA and asthma intubated for acute on chronic hypercapnic respiratory failure.  Failed NIMV in ED, some oropharyngeal trauma on intubation attempt.  Past Medical History:  Asthma OHS/OSA on home O2 3L not yet adherent to BiPAP, followed in Dr. Evlyn Courier clinic Diabetes HFpEF  Significant Hospital Events:  12/19 intubated  12/19 admitted 1/3 > Desaturation and near arrest.  Unable to bag through trach initially so code called for possible intubation from above.  Bagging then became easier, ? Plugging.  Had bronch performed but no plugs seen, only tracheomalacia.   Consults:  ENT  Procedures:  12/19 ETT > 12/23 12/23 Trach >   Significant Diagnostic Tests:  02/09/20 CXR with RUL infiltrate vs atelectasis new post-intubation relative to pre-intubation, retrocardiac opacity  Micro Data:  MRSA screen 12/20 > positive Sputum 12/26>>  MRSA 12/20 blood > ngtd Blood 12/26 > staph caprae in 1/4 bottles  Antimicrobials:  Ceftriaxone 12/19 > 12/21.  Azithromycin 12/19 > 12/21. Zosyn 12/25 > 12/26. Linezolid 12/27 > 1/2.  Interim History / Subjective:  Remained on vent overnight after episodes of distress yesterday afternoon. Asking to come off vent and get up to chair.  Objective   Blood pressure 106/66, pulse 83, temperature 97.6 F (36.4 C), temperature source Oral, resp. rate (!) 28, height 5\' 5"  (1.651 m), weight (!) 162 kg, SpO2 93 %.    Vent Mode: PRVC FiO2 (%):  [60 %-98 %] 60 % Set Rate:  [20 bmp] 20 bmp Vt Set:  [450 mL] 450 mL PEEP:  [8 cmH20] 8 cmH20 Plateau Pressure:  [23 cmH20-26 cmH20] 23 cmH20   Intake/Output Summary (Last 24 hours) at 02/25/2020 0738 Last data filed at 02/25/2020 0700 Gross per 24 hour  Intake 518.94 ml  Output 600 ml   Net -81.06 ml   Filed Weights   02/21/20 0319 02/22/20 0427 02/25/20 0500  Weight: (!) 165.5 kg (!) 161 kg (!) 162 kg    Examination: General: Adult male, resting in bed watching TV, on vent in NAD. Neuro: A&O x 3, no deficits. HEENT: [place]/AT. Sclerae anicteric. Trach C/D/I. Cardiovascular: RRR, no M/R/G.  Lungs: Respirations even and unlabored.  Abdomen: BS x 4, soft, NT/ND.  Musculoskeletal: Obese.  No gross deformities, 1+ edema.  Skin: Intact, warm, no rashes.   Assessment & Plan:   Acute on chronic hypoxemic respiratory failure - s/p trach by ENT Obesity hypoventilation syndrome - s/p trach by ENT Asthma Difficult airway Tracheomalacia Trach to be changed by ENT only - question whether he would benefit from a more rigid trach at some point Transition back to ATC as able, have asked RT to try again this AM Lavage prn VAP prevention bundle Continue BD's  MRSA tracheitis - s/p 7 days linezolid. Bronchial hygiene.  Mucus plugging. Continue Mucomyst nebs. Continue Chest PT. Bronchial hygiene.  Morbid obesity Resume diet if able to come off vent again  Muscular deconditioning PT working with patient  Uncontrolled hypertension Hyperlipidemia Continue Coreg, Atorvastatin.  Acute metabolic encephalopathy seroquel at night Maintain clonazepam for now  Keep in ICU today given distress episode yesterday with possible plugging and concern for tracheomalacia.  We will need to try and figure out whether there are more rigid trach options for him.  Best practice (evaluated daily)  Diet:  regular diet Pain/Anxiety/Delirium protocol (if indicated): minimize VAP protocol (if indicated): yes DVT prophylaxis: lovenox GI prophylaxis: d/c Glucose control: monitor Mobility: up ad lib Disposition: ICU  Goals of Care:  Last date of multidisciplinary goals of care discussion: 12/22 Family and staff present: mother Summary of discussion: full scope of practice Follow up  goals of care discussion:  Will call mother today 1/4 Code Status: full  CC time: 30 min.  Montey Hora, [facility] For pager details, please see AMION 02/25/2020, 7:38 AM

## 2020-02-25 NOTE — Progress Notes (Signed)
Patient has not voided all shift. Bladder scan showed 259 in bladder. [facility] notified.

## 2020-02-25 NOTE — Procedures (Signed)
Bronchoscopy Procedure Note  Shawn Ramsey  [id number]  [birth year]  Date:02/25/20  Time:12:40 PM   Provider Performing:Pete E Kary Kos   Procedure(s):  Flexible Bronchoscopy [phone])  Indication(s) Evaluation of trach placement   Consent Risks of the procedure as well as the alternatives and risks of each were explained to the patient and/or caregiver.  Consent for the procedure was obtained and is signed in the bedside chart  Anesthesia     Time Out Verified patient identification, verified procedure, site/side was marked, verified correct patient position, special equipment/implants available, medications/allergies/relevant history reviewed, required imaging and test results available.   Sterile Technique Usual hand hygiene, masks, gowns, and gloves were used   Procedure Description Bronchoscope advanced through tracheostomy tube and into airway.  Airways were examined down to subsegmental level with findings noted below.   Following diagnostic evaluation,   Findings: trach was occluded by posterior wall of trachea. The Lurline Idol was advanced and re-secured.      Complications/Tolerance None; patient tolerated the procedure well. Chest X-ray is not needed post procedure.   EBL Minimal   Specimen(s) NA Erick Colace ACNP-BC [place] Pager # [phone] OR # [phone] if no answer

## 2020-02-25 NOTE — Progress Notes (Signed)
eLink Physician-Brief Progress Note Patient Name: Shawn Ramsey DOB: 07-15-1980 MRN: [id number]   Date of Service  02/25/2020  HPI/Events of Note  Patient with oliguria, urinary retention and uncertain volume status.  eICU Interventions  Will order in / out foley catheterization and if it occurs x 3 Foley catheter insertion, will check BNP to try to indirectly estimate volume status.        Thomasene Lot Bobbi Kozakiewicz 02/25/2020, 12:23 AM

## 2020-02-25 NOTE — Progress Notes (Signed)
Spoke with resident regarding high peak pressures on patient's ventilator despite being suctioned, lavaged, and fentanyl bolus for pain. STAT chest x-ray ordered, CCM notified of images, and stated ENT needed to be consulted. Informed CCM that ENT consult must come from a physician and not nursing. CCM will consult and will stand-by for consult. Tamsen Meek, RN 02/25/2020 12:09 PM

## 2020-02-25 NOTE — Progress Notes (Signed)
OT Cancellation Note  Patient Details Name: WILLIS HOLQUIN MRN: [id number] DOB: [birth year]   Cancelled Treatment:    Reason Eval/Treat Not Completed: Patient not medically ready (Pt vented at this time and RN hold due to pt's O2 requirements; pt able to get on/off BSC earlier with nursing, but requires assist for pericare. OT to eval for AE needs. OT following.)   Flora Lipps, OTR/L Acute Rehabilitation Services Pager: [phone] Office: [phone]   Flora Lipps 02/25/2020, 5:22 PM

## 2020-02-26 ENCOUNTER — Inpatient Hospital Stay (HOSPITAL_COMMUNITY): Payer: Medicaid Other

## 2020-02-26 DIAGNOSIS — J398 Other specified diseases of upper respiratory tract: Secondary | ICD-10-CM | POA: Diagnosis not present

## 2020-02-26 DIAGNOSIS — J9621 Acute and chronic respiratory failure with hypoxia: Secondary | ICD-10-CM | POA: Diagnosis not present

## 2020-02-26 DIAGNOSIS — Z930 Tracheostomy status: Secondary | ICD-10-CM | POA: Diagnosis not present

## 2020-02-26 DIAGNOSIS — G4733 Obstructive sleep apnea (adult) (pediatric): Secondary | ICD-10-CM | POA: Diagnosis not present

## 2020-02-26 LAB — GLUCOSE, CAPILLARY
Glucose-Capillary: 78 mg/dL (ref 70–99)
Glucose-Capillary: 86 mg/dL (ref 70–99)
Glucose-Capillary: 93 mg/dL (ref 70–99)
Glucose-Capillary: 92 mg/dL (ref 70–99)
Glucose-Capillary: 90 mg/dL (ref 70–99)
Glucose-Capillary: 93 mg/dL (ref 70–99)

## 2020-02-26 LAB — CBC
HCT: 30.7 % — ABNORMAL LOW (ref 39.0–52.0)
Hemoglobin: 9.9 g/dL — ABNORMAL LOW (ref 13.0–17.0)
MCH: 28.4 pg (ref 26.0–34.0)
MCHC: 32.2 g/dL (ref 30.0–36.0)
MCV: 88.2 fL (ref 80.0–100.0)
Platelets: 342 10*3/uL (ref 150–400)
RBC: 3.48 MIL/uL — ABNORMAL LOW (ref 4.22–5.81)
RDW: 14.6 % (ref 11.5–15.5)
WBC: 8.4 10*3/uL (ref 4.0–10.5)
nRBC: 0 % (ref 0.0–0.2)

## 2020-02-26 LAB — BASIC METABOLIC PANEL
Anion gap: 12 (ref 5–15)
BUN: 16 mg/dL (ref 6–20)
CO2: 32 mmol/L (ref 22–32)
Calcium: 8.9 mg/dL (ref 8.9–10.3)
Chloride: 96 mmol/L — ABNORMAL LOW (ref 98–111)
Creatinine, Ser: 0.69 mg/dL (ref 0.61–1.24)
GFR, Estimated: >60 mL/min (ref >60)
Glucose, Bld: 89 mg/dL (ref 70–99)
Potassium: 3.6 mmol/L (ref 3.5–5.1)
Sodium: 140 mmol/L (ref 135–145)

## 2020-02-26 MED ORDER — TORSEMIDE 20 MG PO TABS
20.0000 mg | ORAL_TABLET | Freq: Every day | ORAL | Status: DC
  Administered 2020-02-26: 20 mg via ORAL
  Filled 2020-02-26: qty 1

## 2020-02-26 MED ORDER — GUAIFENESIN 100 MG/5ML PO SOLN
5.0000 mL | ORAL | Status: DC | PRN
  Administered 2020-02-26 – 2020-03-04 (×8): 100 mg
  Filled 2020-02-26 (×8): qty 5

## 2020-02-26 MED ORDER — CLONAZEPAM 0.5 MG PO TABS: 0.5000 mg | ORAL_TABLET | Freq: Four times a day (QID) | ORAL | Status: DC | PRN

## 2020-02-26 MED ORDER — JEVITY 1.5 CAL/FIBER PO LIQD
1000.0000 mL | ORAL | Status: DC
  Administered 2020-02-26 – 2020-03-03 (×7): 1000 mL
  Filled 2020-02-26 (×13): qty 1000

## 2020-02-26 MED ORDER — ACETAMINOPHEN 160 MG/5ML PO SOLN
650.0000 mg | Freq: Four times a day (QID) | ORAL | Status: DC | PRN
  Administered 2020-03-02: 650 mg
  Filled 2020-02-26: qty 20.3

## 2020-02-26 MED ORDER — POLYETHYLENE GLYCOL 3350 17 G PO PACK
17.0000 g | PACK | Freq: Every day | ORAL | Status: DC
  Administered 2020-02-27 – 2020-03-03 (×5): 17 g
  Filled 2020-02-26 (×5): qty 1

## 2020-02-26 MED ORDER — OXYMETAZOLINE HCL 0.05 % NA SOLN
1.0000 | Freq: Two times a day (BID) | NASAL | Status: DC
  Administered 2020-02-26 – 2020-03-06 (×9): 1 via NASAL
  Filled 2020-02-26: qty 30

## 2020-02-26 MED ORDER — ATORVASTATIN CALCIUM 40 MG PO TABS
40.0000 mg | ORAL_TABLET | Freq: Every day | ORAL | Status: DC
  Administered 2020-02-27 – 2020-03-04 (×7): 40 mg
  Filled 2020-02-26 (×7): qty 1

## 2020-02-26 MED ORDER — PROSOURCE TF PO LIQD
45.0000 mL | Freq: Four times a day (QID) | ORAL | Status: DC
  Administered 2020-02-26 – 2020-03-04 (×27): 45 mL
  Filled 2020-02-26 (×26): qty 45

## 2020-02-26 MED ORDER — CLONAZEPAM 1 MG PO TABS
1.0000 mg | ORAL_TABLET | Freq: Two times a day (BID) | ORAL | Status: DC
  Administered 2020-02-26 – 2020-02-29 (×6): 1 mg
  Filled 2020-02-26 (×6): qty 1

## 2020-02-26 MED ORDER — QUETIAPINE FUMARATE 50 MG PO TABS
50.0000 mg | ORAL_TABLET | Freq: Every day | ORAL | Status: DC
  Administered 2020-02-26 – 2020-03-04 (×8): 50 mg
  Filled 2020-02-26 (×8): qty 1

## 2020-02-26 MED ORDER — PANTOPRAZOLE SODIUM 40 MG PO PACK
40.0000 mg | PACK | Freq: Every day | ORAL | Status: DC
  Administered 2020-02-27 – 2020-03-04 (×7): 40 mg
  Filled 2020-02-26 (×10): qty 20

## 2020-02-26 MED ORDER — POLYETHYLENE GLYCOL 3350 17 G PO PACK
17.0000 g | PACK | Freq: Every day | ORAL | Status: DC | PRN
  Filled 2020-02-26: qty 1

## 2020-02-26 MED ORDER — ACETYLCYSTEINE 20 % IN SOLN
4.0000 mL | Freq: Four times a day (QID) | RESPIRATORY_TRACT | Status: AC
  Administered 2020-02-26 – 2020-02-28 (×8): 4 mL via RESPIRATORY_TRACT
  Filled 2020-02-26 (×8): qty 4

## 2020-02-26 MED ORDER — SENNA 8.6 MG PO TABS
1.0000 | ORAL_TABLET | Freq: Two times a day (BID) | ORAL | Status: DC
  Administered 2020-02-26 – 2020-02-29 (×6): 8.6 mg
  Filled 2020-02-26 (×6): qty 1

## 2020-02-26 MED ORDER — DOCUSATE SODIUM 50 MG/5ML PO LIQD
100.0000 mg | Freq: Two times a day (BID) | ORAL | Status: DC | PRN
  Administered 2020-02-26: 100 mg

## 2020-02-26 MED ORDER — DOCUSATE SODIUM 50 MG/5ML PO LIQD
100.0000 mg | Freq: Two times a day (BID) | ORAL | Status: DC
  Administered 2020-02-26 – 2020-03-04 (×15): 100 mg
  Filled 2020-02-26 (×15): qty 10

## 2020-02-26 MED ORDER — CARVEDILOL 12.5 MG PO TABS
6.2500 mg | ORAL_TABLET | Freq: Two times a day (BID) | ORAL | Status: DC
  Administered 2020-02-26 – 2020-03-05 (×15): 6.25 mg
  Filled 2020-02-26 (×16): qty 1

## 2020-02-26 NOTE — Procedures (Signed)
Cortrak  Person Inserting Tube:  Felix Pacini E, RD Tube Type:  Cortrak - 43 inches Tube Location:  Right nare Initial Placement:  Stomach Secured by: Bridle Technique Used to Measure Tube Placement:  Documented cm marking at nare/ corner of mouth Cortrak Secured At:  77 cm    Cortrak Tube Team Note:  Consult received to place a Cortrak feeding tube.   X-ray is required, abdominal x-ray has been ordered by the Cortrak team. Please confirm tube placement before using the Cortrak tube.   If the tube becomes dislodged please keep the tube and contact the Cortrak team at [email or web address] (password [email or web address]) for replacement.  If after hours and replacement cannot be delayed, place a NG tube and confirm placement with an abdominal x-ray.   Felix Pacini, MS, RD, LDN Pager number available on Amion

## 2020-02-26 NOTE — Progress Notes (Signed)
SLP Cancellation Note  Patient Details Name: Shawn Ramsey MRN: [id number] DOB: 06/06/1980   Cancelled treatment:       Reason Eval/Treat Not Completed: Medical issues which prohibited therapy. Pt on ventilator, will return on a later date    Marybell Robards, Riley Nearing 02/26/2020, 2:30 PM

## 2020-02-26 NOTE — Progress Notes (Signed)
PT Cancellation Note  Patient Details Name: Shawn Ramsey MRN: [id number] DOB: 23-Feb-1980   Cancelled Treatment:    Reason Eval/Treat Not Completed: Patient not medically ready. RN asked to hold as ENT trying to work on figuring out best option for trach. Acute PT to return as able, as appropriate.  Lewis Shock, PT, DPT Acute Rehabilitation Services Pager #: [phone] Office #: [phone]    Iona Hansen 02/26/2020, 11:03 AM

## 2020-02-26 NOTE — Progress Notes (Signed)
  ENT follow-up  He is awake and alert and still on the ventilator much of the time.  When he is off the ventilator he is able to speak using a Passy-Muir valve.  They repositioned the tracheostomy yesterday and he was having some occlusion and has had a little bit of bloody secretions since then.  There are some dried blood within the circuit right now but no active bleeding.  I was able to remove the tracheostomy and the sutures and replace it with a new adjustable flange Bivona.  We do not have the same exact size so I changed it to a 8.0 mm inner diameter 130 mm length.  I position the length in an appropriate place.  It was actually easier than I expected to replace the tracheostomy as the tract has formed reasonably well in the chart.  I then passed the fiberoptic scope down the tube and there does seem to be some posterior collapse but partial and the tip of the tracheostomy is well above the carina.  He is breathing well.  Interestingly he is able to phonate when the cuff is down and not phonate when the cuff is up and that even without a Passy-Muir valve.  As long as he is able to oxygenate well with the cuff down on the ventilator I think that is okay to continue that so that he can continue to communicate.  Obviously if he is not keeping his oxygen saturations at an adequate level then reinflating the cuff should solve that.  Unfortunately I do not see any way that we can use a smaller tracheostomy tube just given his anatomy.  I realize this is going to be difficult for future placement and future care.  I explained to him and his mother that realistically the only way he is going to be ever cannulated is with significant weight loss.  I will continue to follow so that we can figure out how to get him stable enough for ultimate placement or discharge.

## 2020-02-26 NOTE — Progress Notes (Signed)
Nutrition Follow-up  DOCUMENTATION CODES:   Morbid obesity  INTERVENTION:   Initiate TF via Cortrak:  -Jevity 1.5 at 55 mL/hr x 24 hours. (1320 mL per day)  -PROSource Plus 45 mL QID, each supplement provides 40 kcals, and 11 grams of protein.   This regimen provides 2140 kcals, 128 grams protein, and 1,003 mL of free water.    NUTRITION DIAGNOSIS:   Inadequate oral intake related to inability to eat as evidenced by NPO status.  Ongoing  GOAL:   Patient will meet greater than or equal to 90% of their needs  Met via TF  MONITOR:   Vent status,Labs,TF tolerance,Weight trends  REASON FOR ASSESSMENT:   Ventilator,Consult Enteral/tube feeding initiation and management  ASSESSMENT:   40 yo male admitted with acute on chronic hypercapnic respiratory failure. Required intubation in the ED. PMH includes OHS/OSA (home oxygen 3 L), DM, HF, asthma, COPD, cognitive developmental delay, psychotic disorder.  -On trach collar since 12/31.  -Diet advanced to regular 1/1 and Cortrak removed 1/2.  -1/3 Desaturation and near arrest. Back on ventilator support -1/5 Tracheostomy changed out after ongoing bleeding.   Pt discussed in ICU rounds.   Cortrak tube placed 1/5, tip in stomach per X-ray.  Labs reviewed: Chloride: 96, CBG's x 24 hours: 78-86  Medications reviewed and include: Colace, Protonix, Senokot, Miralax, Seroquel, Fentanyl in NS   Diet Order:   Diet Order            Diet NPO time specified  Diet effective now                 EDUCATION NEEDS:   No education needs have been identified at this time  Skin:  Skin Assessment: Skin Integrity Issues: Skin Integrity Issues:: Other (Comment) Other: MDRPI- trach site  Last BM:  1/3 type 4  Height:   Ht Readings from Last 1 Encounters:  02/10/20 5' 5"  (1.651 m)    Weight:   Wt Readings from Last 1 Encounters:  02/25/20 (!) 162 kg    Ideal Body Weight:  61.8 kg  BMI:  Body mass index is 59.43  kg/m.  Estimated Nutritional Needs:   Kcal:  2000-2200 kcal  Protein:  120-140 grams  Fluid:  >2 L/day    Ronnald Nian, Dietetic Intern Pager: [phone] If unavailable: [phone]
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
## 2020-02-26 NOTE — Progress Notes (Signed)
NAME:  Shawn Ramsey, MRN:  [id number], DOB:  12/08/1980, LOS: 17 ADMISSION DATE:  02/09/2020, CONSULTATION DATE:  12/24 REFERRING MD:  Mariea Clonts, CHIEF COMPLAINT:  Drowsiness/dyspnea   Brief History:  40 y/o male with OHS/OSA and asthma intubated for acute on chronic hypercapnic respiratory failure.  Failed NIMV in ED, some oropharyngeal trauma on intubation attempt.  Past Medical History:  Asthma OHS/OSA on home O2 3L not yet adherent to BiPAP, followed in Dr. Evlyn Courier clinic Diabetes HFpEF  Significant Hospital Events:  12/19 intubated  12/19 admitted 1/3 > Desaturation and near arrest.  Unable to bag through trach initially so code called for possible intubation from above.  Bagging then became easier, ? Plugging.  Had bronch performed but no plugs seen, only tracheomalacia.  1/4 > elevated peak pressures and distress, bedside bronch performed and trach noted to be up against posterior wall of pharynx.  Trach advanced and repositioned with improvement in peak pressures and pt comfort. 1/5 > ongoing bleeding from trach.  Trach wound looks a bit worse.  Calling ENT back to please re-evaluate.  Consults:  ENT  Procedures:  12/19 ETT > 12/23 12/23 Trach >   Significant Diagnostic Tests:  02/09/20 CXR with RUL infiltrate vs atelectasis new post-intubation relative to pre-intubation, retrocardiac opacity  Micro Data:  MRSA screen 12/20 > positive Sputum 12/26>>  MRSA 12/20 blood > ngtd Blood 12/26 > staph caprae in 1/4 bottles  Antimicrobials:  Ceftriaxone 12/19 > 12/21.  Azithromycin 12/19 > 12/21. Zosyn 12/25 > 12/26. Linezolid 12/27 > 1/2.  Interim History / Subjective:  On vent still. Having bleeding from trach ever since bronch.  He complains of feeling like he has "phlegm" stuck by trach and unable to cough it up.  Objective   Blood pressure (!) 124/97, pulse 89, temperature 97.9 F (36.6 C), temperature source Oral, resp. rate (!) 26, height 5\' 5"  (1.651 m),  weight (!) 162 kg, SpO2 93 %.    Vent Mode: PRVC FiO2 (%):  [50 %-60 %] 50 % Set Rate:  [20 bmp] 20 bmp Vt Set:  [450 mL] 450 mL PEEP:  [8 cmH20] 8 cmH20 Plateau Pressure:  [22 cmH20-24 cmH20] 23 cmH20   Intake/Output Summary (Last 24 hours) at 02/26/2020 0801 Last data filed at 02/26/2020 0600 Gross per 24 hour  Intake 340.3 ml  Output 483.2 ml  Net -142.9 ml   Filed Weights   02/21/20 0319 02/22/20 0427 02/25/20 0500  Weight: (!) 165.5 kg (!) 161 kg (!) 162 kg    Examination: General: Adult male, resting in bed watching TV, on vent in NAD. Neuro: A&O x 3, no deficits. HEENT: [place]/AT. Sclerae anicteric. Trach wound appears worse today with serosanguinous drainage.  Blood in trach cannula. Cardiovascular: RRR, no M/R/G.  Lungs: Respirations even and unlabored.  Abdomen: BS x 4, soft, NT/ND.  Musculoskeletal: Obese.  No gross deformities, 1+ edema.  Skin: Intact, warm, no rashes.   Assessment & Plan:   Acute on chronic hypoxemic respiratory failure - s/p trach by ENT Obesity hypoventilation syndrome - s/p trach by ENT Asthma Difficult airway Tracheomalacia Bleeding from trach cannula - presumed trauma from suctioning, bronch, and trach against posterior wall of trachea. Trach to be changed by ENT only - question whether he would benefit from a more rigid trach at some point.  I have called ENT today 1/15 requesting that they see pt, awaiting callback. Transition back to ATC as able, have asked RT to try again this AM Lavage  prn VAP prevention bundle Continue BD's  MRSA tracheitis - s/p 7 days linezolid. Bronchial hygiene.  Mucus plugging. Continue Mucomyst nebs (has not received yet - re-ordered) Continue Chest PT (has not received yet - re-ordered) Bronchial hygiene  Morbid obesity Resume diet if able to come off vent again  Muscular deconditioning PT working with patient  Uncontrolled hypertension Hyperlipidemia Continue Coreg, Atorvastatin  Acute metabolic  encephalopathy seroquel at night Maintain clonazepam for now   [employer] (evaluated daily)  Diet: regular diet when off vent Pain/Anxiety/Delirium protocol (if indicated): minimize VAP protocol (if indicated): yes DVT prophylaxis: lovenox GI prophylaxis: d/c Glucose control: monitor Mobility: up ad lib Disposition: ICU  Goals of Care:  Last date of multidisciplinary goals of care discussion: 12/22 Family and staff present: mother Summary of discussion: full scope of practice Follow up goals of care discussion:  Will call mother again today 1/5 Code Status: full  CC time: 30 min.  Montey Hora, [facility] For pager details, please see AMION 02/26/2020, 8:01 AM

## 2020-02-26 NOTE — Consult Note (Signed)
WOC Nurse Consult Note: Patient receiving care in [facility] 2M02. Reason for Consult: trach wound Wound type: full thickness wound, primarily to the patient's right side of the tracheostomy opening, related to medical device.  MDRPI. Pressure Injury POA: No Measurement: approximately 1 cm x 2.3 cm Wound bed: covered in secretions, pink, some yellowish Drainage (amount, consistency, odor) tracheal secretions present Periwound: intact Dressing procedure/placement/frequency: Clean around the tracheostomy tube in the standard fashion. Place a Drawtex dressing Hart [place] [phone]) in the same manner as a split gauze dressing. Change daily and prn soilage. The above dressing/care instructions is based on ENT agreement with allowing a dressing.  The patient has a special tracheostomy tubing system due to his difficult airway. Helmut Muster, RN, MSN, CWOCN, CNS-BC, pager [phone]

## 2020-02-27 DIAGNOSIS — J9602 Acute respiratory failure with hypercapnia: Secondary | ICD-10-CM | POA: Diagnosis not present

## 2020-02-27 DIAGNOSIS — J9621 Acute and chronic respiratory failure with hypoxia: Secondary | ICD-10-CM | POA: Diagnosis not present

## 2020-02-27 DIAGNOSIS — J398 Other specified diseases of upper respiratory tract: Secondary | ICD-10-CM | POA: Diagnosis not present

## 2020-02-27 DIAGNOSIS — Z930 Tracheostomy status: Secondary | ICD-10-CM | POA: Diagnosis not present

## 2020-02-27 LAB — CBC
HCT: 32.6 % — ABNORMAL LOW (ref 39.0–52.0)
Hemoglobin: 10.3 g/dL — ABNORMAL LOW (ref 13.0–17.0)
MCH: 27.9 pg (ref 26.0–34.0)
MCHC: 31.6 g/dL (ref 30.0–36.0)
MCV: 88.3 fL (ref 80.0–100.0)
Platelets: 351 10*3/uL (ref 150–400)
RBC: 3.69 MIL/uL — ABNORMAL LOW (ref 4.22–5.81)
RDW: 14.7 % (ref 11.5–15.5)
WBC: 8.9 10*3/uL (ref 4.0–10.5)
nRBC: 0 % (ref 0.0–0.2)

## 2020-02-27 LAB — BASIC METABOLIC PANEL
Anion gap: 13 (ref 5–15)
BUN: 13 mg/dL (ref 6–20)
CO2: 32 mmol/L (ref 22–32)
Calcium: 8.9 mg/dL (ref 8.9–10.3)
Chloride: 97 mmol/L — ABNORMAL LOW (ref 98–111)
Creatinine, Ser: 0.66 mg/dL (ref 0.61–1.24)
GFR, Estimated: >60 mL/min (ref >60)
Glucose, Bld: 101 mg/dL — ABNORMAL HIGH (ref 70–99)
Potassium: 3.9 mmol/L (ref 3.5–5.1)
Sodium: 142 mmol/L (ref 135–145)

## 2020-02-27 LAB — GLUCOSE, CAPILLARY
Glucose-Capillary: 91 mg/dL (ref 70–99)
Glucose-Capillary: 125 mg/dL — ABNORMAL HIGH (ref 70–99)
Glucose-Capillary: 131 mg/dL — ABNORMAL HIGH (ref 70–99)
Glucose-Capillary: 118 mg/dL — ABNORMAL HIGH (ref 70–99)
Glucose-Capillary: 117 mg/dL — ABNORMAL HIGH (ref 70–99)
Glucose-Capillary: 122 mg/dL — ABNORMAL HIGH (ref 70–99)

## 2020-02-27 MED ORDER — FUROSEMIDE 10 MG/ML IJ SOLN
40.0000 mg | Freq: Every day | INTRAMUSCULAR | Status: DC
  Administered 2020-02-27: 40 mg via INTRAVENOUS
  Filled 2020-02-27: qty 4

## 2020-02-27 MED ORDER — OXYCODONE HCL 5 MG/5ML PO SOLN
5.0000 mg | Freq: Four times a day (QID) | ORAL | Status: DC
  Administered 2020-02-27 – 2020-02-29 (×8): 5 mg
  Filled 2020-02-27 (×8): qty 5

## 2020-02-27 MED ORDER — POTASSIUM CHLORIDE 20 MEQ PO PACK
20.0000 meq | PACK | Freq: Every day | ORAL | Status: DC
  Administered 2020-02-27 – 2020-03-04 (×7): 20 meq
  Filled 2020-02-27 (×7): qty 1

## 2020-02-27 NOTE — Progress Notes (Signed)
NAME:  Shawn Ramsey, MRN:  [id number], DOB:  May 17, 1980, LOS: 35 ADMISSION DATE:  02/09/2020, CONSULTATION DATE:  12/24 REFERRING MD:  Denton Brick, CHIEF COMPLAINT:  Drowsiness/dyspnea   Brief History:  40 y/o male with OHS/OSA and asthma intubated for acute on chronic hypercapnic respiratory failure.  Failed NIMV in ED, some oropharyngeal trauma on intubation attempt.  Past Medical History:  Asthma OHS/OSA on home O2 3L not yet adherent to BiPAP, followed in Dr. Juanetta Gosling clinic Diabetes HFpEF  Significant Hospital Events:  12/19 intubated  12/19 admitted 1/3 > Desaturation and near arrest.  Unable to bag through trach initially so code called for possible intubation from above.  Bagging then became easier, ? Plugging.  Had bronch performed but no plugs seen, only tracheomalacia.  1/4 > elevated peak pressures and distress, bedside bronch performed and trach noted to be up against posterior wall of pharynx.  Trach advanced and repositioned with improvement in peak pressures and pt comfort. 1/5 > ongoing bleeding from trach.  Trach wound looks a bit worse.  Calling ENT back to please re-evaluate-trach changed to Bivona adjustable flange  8.0 mm inner diameter 130 mm length  Consults:  ENT  Procedures:  12/19 ETT > 12/23 12/23 Trach >   Significant Diagnostic Tests:  02/09/20 CXR with RUL infiltrate vs atelectasis new post-intubation relative to pre-intubation, retrocardiac opacity  Micro Data:  MRSA screen 12/20 > positive Sputum 12/26>>  MRSA 12/20 blood > ngtd Blood 12/26 > staph caprae in 1/4 bottles  Antimicrobials:  Ceftriaxone 12/19 > 12/21.  Azithromycin 12/19 > 12/21. Zosyn 12/25 > 12/26. Linezolid 12/27 > 1/2.  Interim History / Subjective:  Doing well after trach change by ENT yesterday, still having some thick secretions, asking to use Passy Muir Valve  Objective   Blood pressure 107/60, pulse 90, temperature 98.7 F (37.1 C), temperature source Oral, resp.  rate (!) 21, height 5\' 5"  (1.651 m), weight (!) 162 kg, SpO2 90 %.    Vent Mode: PRVC FiO2 (%):  [50 %-60 %] 60 % Set Rate:  [20 bmp] 20 bmp Vt Set:  [450 mL] 450 mL PEEP:  [8 cmH20] 8 cmH20 Plateau Pressure:  [21 cmH20-22 cmH20] 21 cmH20   Intake/Output Summary (Last 24 hours) at 02/27/2020 0744 Last data filed at 02/27/2020 0600 Gross per 24 hour  Intake 175.24 ml  Output 2000 ml  Net -1824.76 ml   Filed Weights   02/21/20 0319 02/22/20 0427 02/25/20 0500  Weight: (!) 165.5 kg (!) 161 kg (!) 162 kg    Examination: General: Obese male, resting in bed watching TV, trach and vent dependent  Neuro: A&O x 3, no deficits. HEENT: [place]/AT. Sclerae anicteric. Coughing up some blood-tinged secretions, no significant oozing from around trach after replacement yesterday Cardiovascular: RRR, no M/R/G.  Lungs: mechanical breath sounds bilaterally, no rhonchi or wheezing, some coughing Abdomen: BS x 4, soft, NT/ND.  Musculoskeletal: Obese.  No gross deformities, 1+ edema.  Skin: Intact, warm, no rashes.  Assessment & Plan:   Acute on chronic hypoxemic respiratory failure secondary to obesity hypoventilation syndrome and asthma with hx of difficult airway and tracheomalacia  - s/p trach by ENT and replaced with 69mm Brovana 1/5 -Bleeding from trach cannula - presumed trauma from suctioning, bronch, and trach against posterior wall of trachea. P: -ENT re-evaluated patient yesterday and tach changed to adjustable flange Bivona 8.0 mm inner diameter 130 mm length, Likely will not be able to be de-cannulated without losing significant amount of  weight and will likely not be able to down-size trach secondary to anatomy.  He is able to phonate with cuff down and with [name] -goal to continue trach collar trials. May need LTACH placement -PT/OT evaluations -continue duonebs, Mucomyst, chest PT, Brovana, Yupelri  -lavage prn -VAP prevention bundle -On home Torsemide 20mg , remains +6L since  admission with vascular congestion on last CXR, transition to IV Lasix 40mg  qd -monitor electrolytes  MRSA tracheitis - s/p 7 days linezolid. Bronchial hygiene   Mucus plugging. -Continue Mucomyst nebs  -Continue Chest PT  -Bronchial hygiene  Morbid obesity Resume diet if able to come off vent again -Cortrak placed 1/5  Muscular deconditioning PT working with patient  Uncontrolled hypertension Hyperlipidemia Continue Coreg, Atorvastatin  Acute metabolic encephalopathy Improved -seroquel at night -Maintain clonazepam for now  (evaluated daily)  Diet: regular diet when off vent, continue TF with cortrak Pain/Anxiety/Delirium protocol (if indicated): minimize VAP protocol (if indicated): yes DVT prophylaxis: lovenox GI prophylaxis: d/c Glucose control: monitor Mobility: up ad lib Disposition: ICU  Goals of Care:  Last date of multidisciplinary goals of care discussion: 12/22 Family and staff present: mother Summary of discussion: full scope of practice Follow up goals of care discussion:  Pending family update 1/6 Code Status: full  CRITICAL CARE Performed by: [employer] Terrell Ostrand   Total critical care time: 35 minutes  Critical care time was exclusive of separately billable procedures and treating other patients.  Critical care was necessary to treat or prevent imminent or life-threatening deterioration.  Critical care was time spent personally by me on the following activities: development of treatment plan with patient and/or surrogate as well as nursing, discussions with consultants, evaluation of patient's response to treatment, examination of patient, obtaining history from patient or surrogate, ordering and performing treatments and interventions, ordering and review of laboratory studies, ordering and review of radiographic studies, pulse oximetry and re-evaluation of patient's condition.   1/23 Emonie Espericueta, PA-C [place] [facility] For pager details, please see AMION 02/27/2020, 7:44 AM

## 2020-02-27 NOTE — Progress Notes (Signed)
Patient placed back on full support from weaning mode, patient desaturated and maintained a 82% saturation. Fio2 increased to 80% and peep increased to 10. MD made aware

## 2020-02-27 NOTE — TOC Progression Note (Signed)
Transition of Care [facility]) - Progression Note    Patient Details  Name: Shawn Ramsey MRN: [id number] Date of Birth: 1980-12-24  [facility]) CM/SW Contact  Lockie Pares, RN Phone Number: 02/27/2020, 6:06 PM  Clinical Narrative:    Patient had to go back on Full Support on ventilator at 80% after weaning on PS. Dr Gleason mentioned LTAC< however patient not eligible for LTAC with only Medicaid Will have to start planning for SNF NH placement. Converted from fentanyl drip to NG oxycodone.    Expected Discharge Plan: Home w Home Health Services Barriers to Discharge: Continued Medical Work up  Expected Discharge Plan and Services Expected Discharge Plan: Home w Home Health Services                                               Social Determinants of Health (SDOH) Interventions    Readmission Risk Interventions No flowsheet data found.

## 2020-02-27 NOTE — Progress Notes (Signed)
PT Cancellation Note  Patient Details Name: Shawn Ramsey MRN: [id number] DOB: Oct 25, 1980   Cancelled Treatment:    Reason Eval/Treat Not Completed: Medical issues which prohibited therapy (pt on arrival 82% on 60% FiO2, RT present to address with FiO2 up to 80%. Will hold at present until SpO2 maintains in parameters for mobility)   Shawn Ramsey Shawn Ramsey 02/27/2020, 9:10 AM Merryl Hacker, PT Acute Rehabilitation Services Pager: [phone] Office: [phone]

## 2020-02-28 ENCOUNTER — Inpatient Hospital Stay (HOSPITAL_COMMUNITY): Payer: Medicaid Other

## 2020-02-28 DIAGNOSIS — J9621 Acute and chronic respiratory failure with hypoxia: Secondary | ICD-10-CM | POA: Diagnosis not present

## 2020-02-28 DIAGNOSIS — Z978 Presence of other specified devices: Secondary | ICD-10-CM | POA: Diagnosis not present

## 2020-02-28 DIAGNOSIS — I10 Essential (primary) hypertension: Secondary | ICD-10-CM | POA: Diagnosis not present

## 2020-02-28 DIAGNOSIS — J398 Other specified diseases of upper respiratory tract: Secondary | ICD-10-CM | POA: Diagnosis not present

## 2020-02-28 DIAGNOSIS — R6889 Other general symptoms and signs: Secondary | ICD-10-CM

## 2020-02-28 LAB — BASIC METABOLIC PANEL
Anion gap: 12 (ref 5–15)
Anion gap: 10 (ref 5–15)
BUN: 13 mg/dL (ref 6–20)
BUN: 11 mg/dL (ref 6–20)
CO2: 34 mmol/L — ABNORMAL HIGH (ref 22–32)
CO2: 36 mmol/L — ABNORMAL HIGH (ref 22–32)
Calcium: 9 mg/dL (ref 8.9–10.3)
Calcium: 9.1 mg/dL (ref 8.9–10.3)
Chloride: 95 mmol/L — ABNORMAL LOW (ref 98–111)
Chloride: 94 mmol/L — ABNORMAL LOW (ref 98–111)
Creatinine, Ser: 0.66 mg/dL (ref 0.61–1.24)
Creatinine, Ser: 0.65 mg/dL (ref 0.61–1.24)
GFR, Estimated: >60 mL/min (ref >60)
GFR, Estimated: >60 mL/min (ref >60)
Glucose, Bld: 126 mg/dL — ABNORMAL HIGH (ref 70–99)
Glucose, Bld: 117 mg/dL — ABNORMAL HIGH (ref 70–99)
Potassium: 4.1 mmol/L (ref 3.5–5.1)
Potassium: 4.3 mmol/L (ref 3.5–5.1)
Sodium: 141 mmol/L (ref 135–145)
Sodium: 140 mmol/L (ref 135–145)

## 2020-02-28 LAB — GLUCOSE, CAPILLARY
Glucose-Capillary: 101 mg/dL — ABNORMAL HIGH (ref 70–99)
Glucose-Capillary: 106 mg/dL — ABNORMAL HIGH (ref 70–99)
Glucose-Capillary: 113 mg/dL — ABNORMAL HIGH (ref 70–99)
Glucose-Capillary: 118 mg/dL — ABNORMAL HIGH (ref 70–99)
Glucose-Capillary: 118 mg/dL — ABNORMAL HIGH (ref 70–99)
Glucose-Capillary: 121 mg/dL — ABNORMAL HIGH (ref 70–99)

## 2020-02-28 LAB — CBC
HCT: 33.2 % — ABNORMAL LOW (ref 39.0–52.0)
Hemoglobin: 10 g/dL — ABNORMAL LOW (ref 13.0–17.0)
MCH: 27.4 pg (ref 26.0–34.0)
MCHC: 30.1 g/dL (ref 30.0–36.0)
MCV: 91 fL (ref 80.0–100.0)
Platelets: 347 10*3/uL (ref 150–400)
RBC: 3.65 MIL/uL — ABNORMAL LOW (ref 4.22–5.81)
RDW: 14.6 % (ref 11.5–15.5)
WBC: 10.2 10*3/uL (ref 4.0–10.5)
nRBC: 0 % (ref 0.0–0.2)

## 2020-02-28 LAB — MAGNESIUM: Magnesium: 1.8 mg/dL (ref 1.7–2.4)

## 2020-02-28 MED ORDER — FUROSEMIDE 10 MG/ML IJ SOLN
40.0000 mg | Freq: Two times a day (BID) | INTRAMUSCULAR | Status: DC
  Administered 2020-02-28 – 2020-03-05 (×13): 40 mg via INTRAVENOUS
  Filled 2020-02-28 (×13): qty 4

## 2020-02-28 NOTE — Progress Notes (Signed)
OT Cancellation Note  Patient Details Name: Shawn Ramsey MRN: [id number] DOB: 1980-05-13   Cancelled Treatment:    Reason Eval/Treat Not Completed: Medical issues which prohibited therapy;Other (comment) Per critical care note, Unable to tolerate PS, worsening infiltrates on CXR. Will hold on OT session for today but will continue efforts as pt medically appropriate.  [name]., COTA/L Acute Rehabilitation Services [phone]   [name] 02/28/2020, 9:00 AM

## 2020-02-28 NOTE — Progress Notes (Signed)
eLink Physician-Brief Progress Note Patient Name: Shawn Ramsey DOB: 1980-06-04 MRN: [id number]   Date of Service  02/28/2020  HPI/Events of Note  Nursing request for CXR d/t 2 L of blood suctioned from lungs in last 3 days. Nursing states that there is no active bleeding.    eICU Interventions  Plan: 1. Portable CXR.      Intervention Category Major Interventions: Other:  Shawn Ramsey 02/28/2020, 6:03 AM

## 2020-02-28 NOTE — Progress Notes (Signed)
NAME:  Shawn Ramsey, MRN:  [id number], DOB:  01-24-1981, LOS: 52 ADMISSION DATE:  02/09/2020, CONSULTATION DATE:  12/24 REFERRING MD:  Denton Brick, CHIEF COMPLAINT:  Drowsiness/dyspnea   Brief History:  40 y/o male with OHS/OSA and asthma intubated for acute on chronic hypercapnic respiratory failure.  Failed NIMV in ED, some oropharyngeal trauma on intubation attempt.  Past Medical History:  Asthma OHS/OSA on home O2 3L not yet adherent to BiPAP, followed in Dr. Juanetta Gosling clinic Diabetes HFpEF  Significant Hospital Events:  12/19 intubated  12/19 admitted 1/3 > Desaturation and near arrest.  Unable to bag through trach initially so code called for possible intubation from above.  Bagging then became easier, ? Plugging.  Had bronch performed but no plugs seen, only tracheomalacia.  1/4 > elevated peak pressures and distress, bedside bronch performed and trach noted to be up against posterior wall of pharynx.  Trach advanced and repositioned with improvement in peak pressures and pt comfort. 1/5 > ongoing bleeding from trach.  Trach wound looks a bit worse.  Calling ENT back to please re-evaluate-trach changed to Bivona adjustable flange  8.0 mm inner diameter 130 mm length 1/7 Unable to tolerate PS, worsening infiltrates on CXR  Consults:  ENT  Procedures:  12/19 ETT > 12/23 12/23 Trach >   Significant Diagnostic Tests:  02/09/20 CXR with RUL infiltrate vs atelectasis new post-intubation relative to pre-intubation, retrocardiac opacity  1/7 CXR>>Increased bilateral confluent peripheral predominant heterogeneous opacities. Differential considerations include infection or confluent pulmonary edema/hemorrhage.  Micro Data:  MRSA screen 12/20 > positive Sputum 12/26>>  MRSA 12/20 blood > ngtd Blood 12/26 > staph caprae in 1/4 bottles 1/7 Respiratory culture  Antimicrobials:  Ceftriaxone 12/19 > 12/21.  Azithromycin 12/19 > 12/21. Zosyn 12/25 > 12/26. Linezolid 12/27 >  1/2.  Interim History / Subjective:  Pt remains awake and denies any worsening dyspnea.  CXR this AM with worsening infiltratesvs hemorrhage/edema.  1.3L UOP yesterday, remains 6L+  Objective   Blood pressure 117/74, pulse 87, temperature 99.1 F (37.3 C), resp. rate 20, height 5\' 5"  (1.651 m), weight (!) 162 kg, SpO2 93 %.    Vent Mode: PRVC FiO2 (%):  [80 %] 80 % Set Rate:  [20 bmp] 20 bmp Vt Set:  [450 mL] 450 mL PEEP:  [10 cmH20] 10 cmH20 Plateau Pressure:  [15 cmH20-17 cmH20] 15 cmH20   Intake/Output Summary (Last 24 hours) at 02/28/2020 1601 Last data filed at 02/28/2020 0000 Gross per 24 hour  Intake 345 ml  Output 1200 ml  Net -855 ml   Filed Weights   02/21/20 0319 02/22/20 0427 02/25/20 0500  Weight: (!) 165.5 kg (!) 161 kg (!) 162 kg   Examination: General: Obese male, resting in bed watching TV, on full support, denies worsening dyspnea or coughing Neuro: A&O x 3, no deficits. HEENT: [place]/AT. Sclerae anicteric. Lurline Idol site looks clean and dry, some blood tinged secretions aspirated  Cardiovascular: RRR, no M/R/G.  Lungs: mechanical breath sounds bilaterally, no rhonchi or wheezing, some coughing Abdomen: BS x 4, soft, NT/ND.  Musculoskeletal: Obese.  No gross deformities, 1+ edema.  Skin: Intact, warm, no rashes.   Assessment & Plan:   Acute on chronic hypoxemic respiratory failure secondary to obesity hypoventilation syndrome and asthma with hx of difficult airway and tracheomalacia  - s/p trach by ENT and replaced with 2mm Brovana 1/5 -Bleeding from trach cannula - presumed trauma from suctioning, bronch, and trach against posterior wall of trachea. -continued suctioning of blood-tinged  secretions overnight 1/6, suspect pt is aspirating as CXR with worsening infiltrates this AM and requiring increased FiO2 60-80% -remains 6L+ after transitioning from Torsemide to Lasix yesterday, preserved EF on recent echo without evidence of significant RH failure P: -Increase  Lasix to 40mg  bid -repeat respiratory culture, no fever or leukocytosis, hold resuming abx for now -may need repeat bronchoscopy -ENT managing trach, Likely will not be able to be de-cannulated without losing significant amount of weight and will likely not be able to down-size trach secondary to anatomy.  He is able to phonate with cuff down and with Nada Boozer -PT/OT evaluations -continue duonebs, Mucomyst, chest PT, Garlon Hatchet, Yupelri  -lavage prn -VAP prevention bundle -monitor electrolytes  MRSA tracheitis - s/p 7 days linezolid. -Bronchial hygiene -repeat respiratory cultures   Mucus plugging. -Continue Mucomyst nebs  -Continue Chest PT  -Bronchial hygiene  Morbid obesity Resume diet if able to come off vent again -Cortrak placed 1/5 -continue TF  Muscular deconditioning PT working with patient as able  Uncontrolled hypertension Hyperlipidemia -Continue Coreg, Atorvastatin  Acute metabolic encephalopathy Improved -seroquel at night -Maintain clonazepam for now  [employer] (evaluated daily)  Diet: regular diet when off vent, continue TF with cortrak Pain/Anxiety/Delirium protocol (if indicated): minimize VAP protocol (if indicated): yes DVT prophylaxis: lovenox GI prophylaxis: d/c Glucose control: monitor Mobility: up ad lib Disposition: ICU  Goals of Care:  Last date of multidisciplinary goals of care discussion: 12/22 Family and staff present: mother Summary of discussion: full scope of practice Follow up goals of care discussion:  Pending family update 1/7, will update mother once she arrives at the bedside Code Status: full  CRITICAL CARE Performed by: Otilio Carpen Diany Formosa   Total critical care time: 33 minutes  Critical care time was exclusive of separately billable procedures and treating other patients.  Critical care was necessary to treat or prevent imminent or life-threatening deterioration.  Critical care was time spent personally by me on  the following activities: development of treatment plan with patient and/or surrogate as well as nursing, discussions with consultants, evaluation of patient's response to treatment, examination of patient, obtaining history from patient or surrogate, ordering and performing treatments and interventions, ordering and review of laboratory studies, ordering and review of radiographic studies, pulse oximetry and re-evaluation of patient's condition.   Otilio Carpen Mykai Wendorf, PA-C [place] [facility] For pager details, please see AMION 02/28/2020, 8:21 AM

## 2020-02-28 NOTE — Progress Notes (Signed)
PT Cancellation Note  Patient Details Name: Shawn Ramsey MRN: [id number] DOB: 10-08-1980   Cancelled Treatment:    Reason Eval/Treat Not Completed: Patient not medically ready (pt remains on 80%FIO2 with SpO2 87% and not currently medically ready for mobility)   [name] 02/28/2020, 8:28 AM  Bayard Males, PT Acute Rehabilitation Services Pager: [phone] Office: [phone]

## 2020-02-29 ENCOUNTER — Inpatient Hospital Stay (HOSPITAL_COMMUNITY): Payer: Medicaid Other

## 2020-02-29 DIAGNOSIS — J9621 Acute and chronic respiratory failure with hypoxia: Secondary | ICD-10-CM | POA: Diagnosis not present

## 2020-02-29 DIAGNOSIS — Z930 Tracheostomy status: Secondary | ICD-10-CM | POA: Diagnosis not present

## 2020-02-29 DIAGNOSIS — J9622 Acute and chronic respiratory failure with hypercapnia: Secondary | ICD-10-CM | POA: Diagnosis not present

## 2020-02-29 LAB — BASIC METABOLIC PANEL
Anion gap: 13 (ref 5–15)
BUN: 11 mg/dL (ref 6–20)
CO2: 35 mmol/L — ABNORMAL HIGH (ref 22–32)
Calcium: 8.8 mg/dL — ABNORMAL LOW (ref 8.9–10.3)
Chloride: 94 mmol/L — ABNORMAL LOW (ref 98–111)
Creatinine, Ser: 0.69 mg/dL (ref 0.61–1.24)
GFR, Estimated: >60 mL/min (ref >60)
Glucose, Bld: 123 mg/dL — ABNORMAL HIGH (ref 70–99)
Potassium: 3.7 mmol/L (ref 3.5–5.1)
Sodium: 142 mmol/L (ref 135–145)

## 2020-02-29 LAB — CBC
HCT: 30.6 % — ABNORMAL LOW (ref 39.0–52.0)
Hemoglobin: 9.8 g/dL — ABNORMAL LOW (ref 13.0–17.0)
MCH: 28.7 pg (ref 26.0–34.0)
MCHC: 32 g/dL (ref 30.0–36.0)
MCV: 89.5 fL (ref 80.0–100.0)
Platelets: 390 10*3/uL (ref 150–400)
RBC: 3.42 MIL/uL — ABNORMAL LOW (ref 4.22–5.81)
RDW: 14.6 % (ref 11.5–15.5)
WBC: 9.4 10*3/uL (ref 4.0–10.5)
nRBC: 0 % (ref 0.0–0.2)

## 2020-02-29 LAB — GLUCOSE, CAPILLARY
Glucose-Capillary: 123 mg/dL — ABNORMAL HIGH (ref 70–99)
Glucose-Capillary: 126 mg/dL — ABNORMAL HIGH (ref 70–99)
Glucose-Capillary: 117 mg/dL — ABNORMAL HIGH (ref 70–99)
Glucose-Capillary: 132 mg/dL — ABNORMAL HIGH (ref 70–99)
Glucose-Capillary: 124 mg/dL — ABNORMAL HIGH (ref 70–99)
Glucose-Capillary: 112 mg/dL — ABNORMAL HIGH (ref 70–99)

## 2020-02-29 MED ORDER — SENNA 8.6 MG PO TABS
2.0000 | ORAL_TABLET | Freq: Two times a day (BID) | ORAL | Status: DC
  Administered 2020-02-29 – 2020-03-03 (×6): 17.2 mg
  Filled 2020-02-29 (×7): qty 2

## 2020-02-29 MED ORDER — LORAZEPAM 2 MG/ML IJ SOLN
1.0000 mg | Freq: Once | INTRAMUSCULAR | Status: AC
  Administered 2020-03-01: 1 mg via INTRAVENOUS
  Filled 2020-02-29: qty 1

## 2020-02-29 NOTE — Progress Notes (Signed)
Pt could not tolerated CPT today.

## 2020-02-29 NOTE — Progress Notes (Signed)
eLink Physician-Brief Progress Note Patient Name: Shawn Ramsey DOB: 06-23-1980 MRN: [id number]   Date of Service  02/29/2020  HPI/Events of Note  Anxiety. Patient requests a medication for this. Patient has tracheostomy and is on the vent.  eICU Interventions  Ativan 1mg  IV ordered x1 dose.     Intervention Category Minor Interventions: Agitation / anxiety - evaluation and management  Charlott Rakes 02/29/2020, 11:54 PM

## 2020-02-29 NOTE — Progress Notes (Signed)
NAME:  Shawn Ramsey, MRN:  [id number], DOB:  04-29-1980, LOS: 15 ADMISSION DATE:  02/09/2020, CONSULTATION DATE:  12/24 REFERRING MD:  Denton Brick, CHIEF COMPLAINT:  Drowsiness/dyspnea   Brief History:  40 y/o male with OHS/OSA and asthma intubated for acute on chronic hypercapnic respiratory failure.  Failed NIMV in ED, some oropharyngeal trauma on intubation attempt.  Past Medical History:  Asthma OHS/OSA on home O2 3L not yet adherent to BiPAP, followed in [facility] Diabetes HFpEF  Significant Hospital Events:  12/19 intubated  12/19 admitted 1/3 > Desaturation and near arrest.  Unable to bag through trach initially so code called for possible intubation from above.  Bagging then became easier, ? Plugging.  Had bronch performed but no plugs seen, only tracheomalacia.  1/4 > elevated peak pressures and distress, bedside bronch performed and trach noted to be up against posterior wall of pharynx.  Trach advanced and repositioned with improvement in peak pressures and pt comfort. 1/5 > ongoing bleeding from trach.  Trach wound looks a bit worse.  Calling ENT back to please re-evaluate-trach changed to Bivona adjustable flange  8.0 mm inner diameter 130 mm length 1/7 Unable to tolerate PS, worsening infiltrates on CXR  Consults:  ENT  Procedures:  12/19 ETT > 12/23 12/23 Trach >  1/7 Bronchoscopy to identify trach positioning  Significant Diagnostic Tests:  02/09/20 CXR with RUL infiltrate vs atelectasis new post-intubation relative to pre-intubation, retrocardiac opacity  1/7 CXR>>Increased bilateral confluent peripheral predominant heterogeneous opacities. Differential considerations include infection or confluent pulmonary edema/hemorrhage.  Micro Data:  MRSA screen 12/20 > positive Sputum 12/26>>  MRSA 12/20 blood > ngtd Blood 12/26 > staph caprae in 1/4 bottles 1/7 Respiratory culture  Antimicrobials:  Ceftriaxone 12/19 > 12/21.  Azithromycin 12/19 >  12/21. Zosyn 12/25 > 12/26. Linezolid 12/27 > 1/2.  Interim History / Subjective:  Trach repositioned yesterday via bronchoscopy. Continues to have blood tinged secretions via trach.   No acute events overnight.   Objective   Blood pressure 122/68, pulse 88, temperature 98.2 F (36.8 C), temperature source Oral, resp. rate 20, height 5\' 5"  (1.651 m), weight (!) 160.7 kg, SpO2 90 %.    Vent Mode: PRVC FiO2 (%):  [60 %-100 %] 60 % Set Rate:  [20 bmp] 20 bmp Vt Set:  [450 mL] 450 mL PEEP:  [10 cmH20] 10 cmH20 Plateau Pressure:  [14 cmH20-26 cmH20] 19 cmH20   Intake/Output Summary (Last 24 hours) at 02/29/2020 0928 Last data filed at 02/29/2020 0600 Gross per 24 hour  Intake 990 ml  Output 1700 ml  Net -710 ml   Filed Weights   02/22/20 0427 02/25/20 0500 02/29/20 0500  Weight: (!) 161 kg (!) 162 kg (!) 160.7 kg   Examination: General: Obese male, resting in bed, no acute distress Neuro: A&O x 3, no deficits. HEENT: [place]/AT. Sclerae anicteric. Lurline Idol site looks clean and dry, blood tinged secretions present in tubing Cardiovascular: RRR, no M/R/G.  Lungs: rhonchi on left. Otherwise course breath sounds. No wheezing. Abdomen: BS x 4, soft, NT/ND.  Musculoskeletal: Obese.  No gross deformities, 1+ edema.  Skin: Intact, warm, no rashes.   Assessment & Plan:   Acute on chronic hypoxemic respiratory failure  In setting of obesity hypoventilation syndrome and asthma with hx of difficult airway and tracheomalacia  - s/p trach by ENT and replaced with 77mm Bivona 1/5 P: -Continue Lasix 40mg  bid -repeat respiratory culture pending, gram stain with gram + cocci. No fever or leukocytosis,  hold resuming abx for now -ENT managing trach, Likely will not be able to be de-cannulated without losing significant amount of weight and will likely not be able to down-size trach secondary to anatomy. - Continue vent support at night, will try weaning on PSV today - Removing scheduled benzodiazepines  and narcotics. PRN narcotics ordered for any pain or agitation -PT/OT evaluations -continue duonebs, Mucomyst, chest PT, Brovana, Yupelri  -lavage prn -VAP prevention bundle -monitor electrolytes  MRSA tracheitis - s/p 7 days linezolid. -Bronchial hygiene -repeat respiratory cultures  Mucus plugging. -Continue Mucomyst nebs  -Continue Chest PT  -Bronchial hygiene  Morbid obesity Resume diet if able to come off vent again -Cortrak placed 1/5 -continue TF  Muscular deconditioning PT working with patient as able  Uncontrolled hypertension Hyperlipidemia -Continue Coreg, Atorvastatin  Acute metabolic encephalopathy Improved -seroquel at night  Best practice (evaluated daily)  Diet: regular diet when off vent, continue TF with cortrak Pain/Anxiety/Delirium protocol (if indicated): minimize VAP protocol (if indicated): yes DVT prophylaxis: lovenox GI prophylaxis: d/c Glucose control: monitor Mobility: up ad lib Disposition: ICU  Goals of Care:  Last date of multidisciplinary goals of care discussion: 12/22 Family and staff present: mother Summary of discussion: full scope of practice Follow up goals of care discussion:  family updated 1/8, spoke with mother on the phone Code Status: full  CRITICAL CARE Performed by: Freddi Starr   Total critical care time: 45 minutes  Critical care time was exclusive of separately billable procedures and treating other patients.  Critical care was necessary to treat or prevent imminent or life-threatening deterioration.  Critical care was time spent personally by me on the following activities: development of treatment plan with patient and/or surrogate as well as nursing, discussions with consultants, evaluation of patient's response to treatment, examination of patient, obtaining history from patient or surrogate, ordering and performing treatments and interventions, ordering and review of laboratory studies, ordering and  review of radiographic studies, pulse oximetry and re-evaluation of patient's condition.   Freddi Starr, MD [facility] For pager details, please see AMION 02/29/2020, 9:28 AM

## 2020-03-01 ENCOUNTER — Inpatient Hospital Stay (HOSPITAL_COMMUNITY): Payer: Medicaid Other

## 2020-03-01 DIAGNOSIS — J9621 Acute and chronic respiratory failure with hypoxia: Secondary | ICD-10-CM | POA: Diagnosis not present

## 2020-03-01 DIAGNOSIS — J9622 Acute and chronic respiratory failure with hypercapnia: Secondary | ICD-10-CM | POA: Diagnosis not present

## 2020-03-01 DIAGNOSIS — Z930 Tracheostomy status: Secondary | ICD-10-CM | POA: Diagnosis not present

## 2020-03-01 DIAGNOSIS — L8990 Pressure ulcer of unspecified site, unspecified stage: Secondary | ICD-10-CM | POA: Insufficient documentation

## 2020-03-01 LAB — CBC WITH DIFFERENTIAL/PLATELET
Abs Immature Granulocytes: 0.06 10*3/uL (ref 0.00–0.07)
Basophils Absolute: 0 10*3/uL (ref 0.0–0.1)
Basophils Relative: 0 %
Eosinophils Absolute: 0.4 10*3/uL (ref 0.0–0.5)
Eosinophils Relative: 4 %
HCT: 33.6 % — ABNORMAL LOW (ref 39.0–52.0)
Hemoglobin: 10.3 g/dL — ABNORMAL LOW (ref 13.0–17.0)
Immature Granulocytes: 1 %
Lymphocytes Relative: 13 %
Lymphs Abs: 1.4 10*3/uL (ref 0.7–4.0)
MCH: 28 pg (ref 26.0–34.0)
MCHC: 30.7 g/dL (ref 30.0–36.0)
MCV: 91.3 fL (ref 80.0–100.0)
Monocytes Absolute: 0.7 10*3/uL (ref 0.1–1.0)
Monocytes Relative: 6 %
Neutro Abs: 7.9 10*3/uL — ABNORMAL HIGH (ref 1.7–7.7)
Neutrophils Relative %: 76 %
Platelets: 369 10*3/uL (ref 150–400)
RBC: 3.68 MIL/uL — ABNORMAL LOW (ref 4.22–5.81)
RDW: 14.5 % (ref 11.5–15.5)
WBC: 10.5 10*3/uL (ref 4.0–10.5)
nRBC: 0 % (ref 0.0–0.2)

## 2020-03-01 LAB — CULTURE, RESPIRATORY W GRAM STAIN: Gram Stain: NONE SEEN

## 2020-03-01 LAB — GLUCOSE, CAPILLARY
Glucose-Capillary: 105 mg/dL — ABNORMAL HIGH (ref 70–99)
Glucose-Capillary: 113 mg/dL — ABNORMAL HIGH (ref 70–99)
Glucose-Capillary: 115 mg/dL — ABNORMAL HIGH (ref 70–99)
Glucose-Capillary: 119 mg/dL — ABNORMAL HIGH (ref 70–99)
Glucose-Capillary: 125 mg/dL — ABNORMAL HIGH (ref 70–99)
Glucose-Capillary: 133 mg/dL — ABNORMAL HIGH (ref 70–99)

## 2020-03-01 LAB — BASIC METABOLIC PANEL
Anion gap: 10 (ref 5–15)
BUN: 11 mg/dL (ref 6–20)
CO2: 38 mmol/L — ABNORMAL HIGH (ref 22–32)
Calcium: 9.2 mg/dL (ref 8.9–10.3)
Chloride: 94 mmol/L — ABNORMAL LOW (ref 98–111)
Creatinine, Ser: 0.7 mg/dL (ref 0.61–1.24)
GFR, Estimated: >60 mL/min (ref >60)
Glucose, Bld: 128 mg/dL — ABNORMAL HIGH (ref 70–99)
Potassium: 4.1 mmol/L (ref 3.5–5.1)
Sodium: 142 mmol/L (ref 135–145)

## 2020-03-01 LAB — BLOOD GAS, ARTERIAL
Acid-Base Excess: 11.7 mmol/L — ABNORMAL HIGH (ref 0.0–2.0)
Bicarbonate: 38 mmol/L — ABNORMAL HIGH (ref 20.0–28.0)
Drawn by: 60057
FIO2: 100
O2 Saturation: 96.9 %
Patient temperature: 36.6
pCO2 arterial: 73.5 mmHg (ref 32.0–48.0)
pH, Arterial: 7.331 — ABNORMAL LOW (ref 7.350–7.450)
pO2, Arterial: 101 mmHg (ref 83.0–108.0)

## 2020-03-01 LAB — MAGNESIUM: Magnesium: 1.8 mg/dL (ref 1.7–2.4)

## 2020-03-01 MED ORDER — MIDAZOLAM HCL 2 MG/2ML IJ SOLN: 1.0000 mg | Freq: Once | INTRAMUSCULAR | Status: AC

## 2020-03-01 MED ORDER — CEFEPIME HCL 2 G IJ SOLR
2.0000 g | Freq: Three times a day (TID) | INTRAMUSCULAR | Status: AC
  Administered 2020-03-01 – 2020-03-08 (×20): 2 g via INTRAVENOUS
  Filled 2020-03-01 (×23): qty 2

## 2020-03-01 MED ORDER — MIDAZOLAM HCL 2 MG/2ML IJ SOLN
INTRAMUSCULAR | Status: AC
  Administered 2020-03-01: 1 mg via INTRAVENOUS
  Filled 2020-03-01: qty 2

## 2020-03-01 MED ORDER — LORAZEPAM 2 MG/ML IJ SOLN
1.0000 mg | Freq: Once | INTRAMUSCULAR | Status: AC
  Administered 2020-03-01: 1 mg via INTRAVENOUS
  Filled 2020-03-01: qty 1

## 2020-03-01 NOTE — Procedures (Signed)
Bronchoscopy:  Done under emergent circumstances for acute hypoxemia.   Flexible disposable bronch inserted into Bovona 8 cuffed trach.  Blood clot found at distal end of trach.  Suctioned, coughed , cough cleared.  Bronch reentered, Trach in airway.  Old blood in the airway, no active bleeding.   Some persistent dark red clot vs tissue around the distal end of the trach (not obstructing the trach but around the distal end).  Pushed in the trach (estimated about 2cm by the RT) while the Bronchoscopy in the airway.    Patient tolerated the procedure.  Placed back on the vent following the procedure.

## 2020-03-01 NOTE — Progress Notes (Signed)
Called to evaluate patient for hypoxia, difficulty bagging through tracheostomy.    Sat 69%-70% initially, unable to bag.  Pt is awake, HFNC on face, cuff had been deflated.  Sat actually improved to 80s, allowing me time to evaluate trach with bronchoscopy.    Blood clot was completely occluding the trach.   Suctioning and saline administered, patient coughed blood clot out. Bagging then successful easily. Sat improved to 90s.   Reevaluated with bronch, tracheostomy tube in the airway, though there appears to still be some clot or granulation tissue on the around the distal end of the trach, could not be suctioned away. Removed bag and replaced bronchoscope, With the bronch in the airway, advanced the trach an estimated 2cm (RT assisting and pushed in trach while I bronched).   Patient placed on vent, PS 18/11, 100%, doing well pulling volumes of 300s-400s on this setting.  Patient awake and intermittently coughing.   Will give single dose versed.    Will check CXR and ABG in 1/2 hr.

## 2020-03-01 NOTE — Progress Notes (Addendum)
Addendum 9:31am: We will plan to hold lovenox today to see if these helps with reduction of bleeding at the tracheostomy site.   Addendum 4:35pm: Adding cefepime to treat pseudomonas on recent tracheal aspirate culture.     NAME:  Shawn Ramsey, MRN:  [id number], DOB:  [birth year], LOS: 21 ADMISSION DATE:  02/09/2020, CONSULTATION DATE:  12/24 REFERRING MD:  Denton Brick, CHIEF COMPLAINT:  Drowsiness/dyspnea   Brief History:  40 y/o male with OHS/OSA and asthma intubated for acute on chronic hypercapnic respiratory failure.  Failed NIMV in ED, some oropharyngeal trauma on intubation attempt.  Past Medical History:  Asthma OHS/OSA on home O2 3L not yet adherent to BiPAP, followed in [facility] Diabetes HFpEF  Significant Hospital Events:  12/19 intubated  12/19 admitted 1/3 > Desaturation and near arrest.  Unable to bag through trach initially so code called for possible intubation from above.  Bagging then became easier, ? Plugging.  Had bronch performed but no plugs seen, only tracheomalacia.  1/4 > elevated peak pressures and distress, bedside bronch performed and trach noted to be up against posterior wall of pharynx.  Trach advanced and repositioned with improvement in peak pressures and pt comfort. 1/5 > ongoing bleeding from trach.  Trach wound looks a bit worse.  Calling ENT back to please re-evaluate-trach changed to Bivona adjustable flange  8.0 mm inner diameter 130 mm length 1/7 Unable to tolerate PS, worsening infiltrates on CXR  Consults:  ENT  Procedures:  12/19 ETT > 12/23 12/23 Trach >  1/7 Bronchoscopy to identify trach positioning  Significant Diagnostic Tests:  02/09/20 CXR with RUL infiltrate vs atelectasis new post-intubation relative to pre-intubation, retrocardiac opacity  1/7 CXR>>Increased bilateral confluent peripheral predominant heterogeneous opacities. Differential considerations include infection or confluent pulmonary edema/hemorrhage.  Micro  Data:  MRSA screen 12/20 > positive Sputum 12/26>>  MRSA 12/20 blood > ngtd Blood 12/26 > staph caprae in 1/4 bottles 1/7 Respiratory culture  Antimicrobials:  Ceftriaxone 12/19 > 12/21.  Azithromycin 12/19 > 12/21. Zosyn 12/25 > 12/26. Linezolid 12/27 > 1/2.  Interim History / Subjective:  Trach repositioned again overnight via bronchoscopy which was done previously on 1/7. Also noted blood clot at distal end of trach which was cleared via bronchoscopy.   Chest radiograph shows 1/8 shows trach less deep as compared today's cxr after repositioning with bronch.   Patient resting in bed without complaints at this time.   Objective   Blood pressure 132/81, pulse 88, temperature 99.2 F (37.3 C), temperature source Oral, resp. rate 18, height 5\' 5"  (1.651 m), weight (!) 158.2 kg, SpO2 97 %.    Vent Mode: PSV FiO2 (%):  [50 %-100 %] 100 % Set Rate:  [20 bmp] 20 bmp Vt Set:  [450 mL] 450 mL PEEP:  [8 cmH20-10 cmH20] 10 cmH20 Pressure Support:  [11 cmH20] 11 cmH20 Plateau Pressure:  [17 cmH20-24 cmH20] 24 cmH20   Intake/Output Summary (Last 24 hours) at 03/01/2020 0914 Last data filed at 03/01/2020 0748 Gross per 24 hour  Intake 550.33 ml  Output 1725 ml  Net -1174.67 ml   Filed Weights   02/25/20 0500 02/29/20 0500 03/01/20 0500  Weight: (!) 162 kg (!) 160.7 kg (!) 158.2 kg   Examination: General: Obese male, resting in bed, no acute distress Neuro: A&O x 3, no deficits. HEENT: [place]/AT. Sclerae anicteric. Trach site looks clean and dry, blood tinged secretions present in tubing. Trach secured at 14cm. Cardiovascular: RRR, no M/R/G.  Lungs: Course breath sounds  bilaterally. No wheezing. Abdomen: BS x 4, soft, NT/ND.  Musculoskeletal: Obese.  No gross deformities, No edema.  Skin: Intact, warm, no rashes.   Assessment & Plan:   Acute on chronic hypoxemic respiratory failure  In setting of obesity hypoventilation syndrome and asthma with hx of difficult airway and  tracheomalacia  - s/p trach by ENT and replaced with 76mm Bivona 1/5 - continues to have complications with trach extravasation leading to occlusion with coughing fits and oxygen desaturations P: -Continue Lasix 40mg  bid -repeat respiratory culture pending, gram stain with gram + cocci. No fever or leukocytosis, hold resuming abx for now -ENT managing trach, Likely will not be able to be de-cannulated without losing significant amount of weight and will likely not be able to down-size trach secondary to anatomy. - Continue vent support at night, will try weaning on PSV today - Daily chest xray to monitor trach position along with bedside evaluation of trach. Lurline Idol should be inserted to 14cm at all times. -PT/OT evaluations -continue duonebs, Mucomyst, chest PT, Brovana, Yupelri  -lavage prn -VAP prevention bundle -monitor electrolytes  MRSA tracheitis - s/p 7 days linezolid. -Bronchial hygiene -repeat respiratory cultures  Mucus plugging. -Continue Mucomyst nebs  -Continue Chest PT  -Bronchial hygiene - PRN bronchoscopy  Morbid obesity Resume diet if able to come off vent again -Cortrak placed 1/5 -continue TF  Muscular deconditioning PT working with patient as able  Uncontrolled hypertension Hyperlipidemia -Continue Coreg, Atorvastatin  Acute metabolic encephalopathy Improved -seroquel at night  Best practice (evaluated daily)  Diet: regular diet when off vent, continue TF with cortrak Pain/Anxiety/Delirium protocol (if indicated): minimize VAP protocol (if indicated): yes DVT prophylaxis: lovenox GI prophylaxis: d/c Glucose control: monitor Mobility: up ad lib Disposition: ICU  Goals of Care:  Last date of multidisciplinary goals of care discussion: 12/22 Family and staff present: mother Summary of discussion: full scope of practice Follow up goals of care discussion:  family updated 1/8, spoke with mother on the phone Code Status: full  CRITICAL  CARE Performed by: Freddi Starr   Total critical care time: 40 minutes  Critical care time was exclusive of separately billable procedures and treating other patients.  Critical care was necessary to treat or prevent imminent or life-threatening deterioration.  Critical care was time spent personally by me on the following activities: development of treatment plan with patient and/or surrogate as well as nursing, discussions with consultants, evaluation of patient's response to treatment, examination of patient, obtaining history from patient or surrogate, ordering and performing treatments and interventions, ordering and review of laboratory studies, ordering and review of radiographic studies, pulse oximetry and re-evaluation of patient's condition.   Freddi Starr, MD [facility] For pager details, please see AMION 03/01/2020, 9:14 AM

## 2020-03-01 NOTE — Progress Notes (Signed)
Went to patient's bedside to assess due to persistent coughing, sneezing and complaints of shortness of breath. While at bedside, noted patient's oxygen saturations began to decrease into the 70's. Increased work of breathing noted. Unable to advance suction catheter during attempt to suction patient's airway. Respiratory therapy notified and presented to bedside to assist with care. Noted two moderately sized dried blood clots came out of patient's trach upon disconnecting vent to bag patient.  Respiratory therapist unable to advance suction catheter and also noted difficulty bagging patient. Dr Gillermina Phy Warren Lacy) notified and visualized patient on camera for further assessment/advisement. Dr Duwayne Heck notified and presented to bedside to assess/assist with care.

## 2020-03-01 NOTE — Progress Notes (Signed)
Pharmacy Antibiotic Note  ASAIAH SCARBER is a 40 y.o. male admitted on 02/09/2020 with Drowsiness/dyspnea.  Pharmacy now being consulted for cefepime dosing.  Patient with very long stay. Known and treated MRSA tracheitis with linezolid now with pseudomonas growing from respiratory culture which is pan sensitive. Cefepime added to regimen. No new fevers, wbc 10. Renal function normal. No dose adjustments warranted.   Plan: Cefepime 2g q8 hours  Height: 5\' 5"  (165.1 cm) Weight: (!) 158.2 kg (348 lb 12.3 oz) IBW/kg (Calculated) : 61.5  Temp (24hrs), Avg:98.6 F (37 C), Min:98 F (36.7 C), Max:99.2 F (37.3 C)  Recent Labs  Lab 02/26/20 0154 02/27/20 0118 02/28/20 0103 02/28/20 1520 02/29/20 1049 03/01/20 0957  WBC 8.4 8.9 10.2  --  9.4 10.5  CREATININE 0.69 0.66 0.66 0.65 0.69 0.70    Estimated Creatinine Clearance: 175.7 mL/min (by C-G formula based on SCr of 0.7 mg/dL).    No Known Allergies  Erin Hearing PharmD., BCPS Clinical Pharmacist 03/01/2020 4:45 PM

## 2020-03-01 NOTE — Progress Notes (Signed)
eLink Physician-Brief Progress Note Patient Name: Shawn Ramsey DOB: 1980/07/14 MRN: [id number]   Date of Service  03/01/2020  HPI/Events of Note  Asking for ativan, in am helped for anxiety, 70% fio2 on trach vented.  eICU Interventions  Ativan 1 mg IV ordered     Intervention Category Minor Interventions: Agitation / anxiety - evaluation and management  Elmer Sow 03/01/2020, 10:13 PM

## 2020-03-02 ENCOUNTER — Inpatient Hospital Stay (HOSPITAL_COMMUNITY): Payer: Medicaid Other

## 2020-03-02 DIAGNOSIS — M79609 Pain in unspecified limb: Secondary | ICD-10-CM

## 2020-03-02 DIAGNOSIS — G9340 Encephalopathy, unspecified: Secondary | ICD-10-CM | POA: Diagnosis not present

## 2020-03-02 DIAGNOSIS — A498 Other bacterial infections of unspecified site: Secondary | ICD-10-CM | POA: Diagnosis not present

## 2020-03-02 DIAGNOSIS — R52 Pain, unspecified: Secondary | ICD-10-CM

## 2020-03-02 DIAGNOSIS — J40 Bronchitis, not specified as acute or chronic: Secondary | ICD-10-CM

## 2020-03-02 DIAGNOSIS — J9621 Acute and chronic respiratory failure with hypoxia: Secondary | ICD-10-CM | POA: Diagnosis not present

## 2020-03-02 DIAGNOSIS — R6889 Other general symptoms and signs: Secondary | ICD-10-CM | POA: Diagnosis not present

## 2020-03-02 LAB — BASIC METABOLIC PANEL
Anion gap: 12 (ref 5–15)
BUN: 14 mg/dL (ref 6–20)
CO2: 35 mmol/L — ABNORMAL HIGH (ref 22–32)
Calcium: 9.3 mg/dL (ref 8.9–10.3)
Chloride: 97 mmol/L — ABNORMAL LOW (ref 98–111)
Creatinine, Ser: 0.64 mg/dL (ref 0.61–1.24)
GFR, Estimated: >60 mL/min (ref >60)
Glucose, Bld: 110 mg/dL — ABNORMAL HIGH (ref 70–99)
Potassium: 3.6 mmol/L (ref 3.5–5.1)
Sodium: 144 mmol/L (ref 135–145)

## 2020-03-02 LAB — GLUCOSE, CAPILLARY
Glucose-Capillary: 126 mg/dL — ABNORMAL HIGH (ref 70–99)
Glucose-Capillary: 127 mg/dL — ABNORMAL HIGH (ref 70–99)
Glucose-Capillary: 146 mg/dL — ABNORMAL HIGH (ref 70–99)
Glucose-Capillary: 123 mg/dL — ABNORMAL HIGH (ref 70–99)
Glucose-Capillary: 113 mg/dL — ABNORMAL HIGH (ref 70–99)
Glucose-Capillary: 123 mg/dL — ABNORMAL HIGH (ref 70–99)

## 2020-03-02 LAB — CBC
HCT: 31.6 % — ABNORMAL LOW (ref 39.0–52.0)
Hemoglobin: 9.9 g/dL — ABNORMAL LOW (ref 13.0–17.0)
MCH: 28.3 pg (ref 26.0–34.0)
MCHC: 31.3 g/dL (ref 30.0–36.0)
MCV: 90.3 fL (ref 80.0–100.0)
Platelets: 449 10*3/uL — ABNORMAL HIGH (ref 150–400)
RBC: 3.5 MIL/uL — ABNORMAL LOW (ref 4.22–5.81)
RDW: 14.7 % (ref 11.5–15.5)
WBC: 11.7 10*3/uL — ABNORMAL HIGH (ref 4.0–10.5)
nRBC: 0 % (ref 0.0–0.2)

## 2020-03-02 MED ORDER — CLONAZEPAM 0.5 MG PO TABS
0.5000 mg | ORAL_TABLET | Freq: Two times a day (BID) | ORAL | Status: DC | PRN
  Administered 2020-03-02 – 2020-03-04 (×6): 0.5 mg
  Filled 2020-03-02 (×6): qty 1

## 2020-03-02 MED ORDER — CETIRIZINE HCL 5 MG/5ML PO SOLN
5.0000 mg | Freq: Every day | ORAL | Status: DC
  Filled 2020-03-02: qty 5

## 2020-03-02 MED ORDER — KETOROLAC TROMETHAMINE 30 MG/ML IJ SOLN
30.0000 mg | Freq: Once | INTRAMUSCULAR | Status: AC
  Administered 2020-03-02: 30 mg via INTRAVENOUS
  Filled 2020-03-02: qty 1

## 2020-03-02 MED ORDER — CLONAZEPAM 0.1 MG/ML ORAL SUSPENSION: 0.5000 mg | Freq: Two times a day (BID) | ORAL | Status: DC | PRN

## 2020-03-02 MED ORDER — ORAL CARE MOUTH RINSE
15.0000 mL | OROMUCOSAL | Status: DC
  Administered 2020-03-03 – 2020-03-05 (×19): 15 mL via OROMUCOSAL

## 2020-03-02 MED ORDER — CETIRIZINE HCL 5 MG/5ML PO SOLN
5.0000 mg | Freq: Every day | ORAL | Status: DC
  Administered 2020-03-03 – 2020-03-04 (×2): 5 mg
  Filled 2020-03-02 (×3): qty 5

## 2020-03-02 MED ORDER — CHLORHEXIDINE GLUCONATE 0.12% ORAL RINSE (MEDLINE KIT): 15.0000 mL | Freq: Two times a day (BID) | OROMUCOSAL | Status: DC

## 2020-03-02 MED ORDER — CHLORHEXIDINE GLUCONATE 0.12 % MT SOLN
15.0000 mL | Freq: Two times a day (BID) | OROMUCOSAL | Status: DC
  Administered 2020-03-02 – 2020-03-13 (×23): 15 mL via OROMUCOSAL
  Filled 2020-03-02 (×20): qty 15

## 2020-03-02 MED ORDER — ORAL CARE MOUTH RINSE
15.0000 mL | Freq: Two times a day (BID) | OROMUCOSAL | Status: DC
  Administered 2020-03-02 (×2): 15 mL via OROMUCOSAL

## 2020-03-02 MED ORDER — NYSTATIN 100000 UNIT/ML MT SUSP
5.0000 mL | Freq: Four times a day (QID) | OROMUCOSAL | Status: DC
  Administered 2020-03-02 – 2020-03-13 (×40): 500000 [IU] via OROMUCOSAL
  Filled 2020-03-02 (×39): qty 5

## 2020-03-02 NOTE — Progress Notes (Addendum)
NAME:  Shawn Ramsey, MRN:  [id number], DOB:  12/26/1980, LOS: 59 ADMISSION DATE:  02/09/2020, CONSULTATION DATE:  12/24 REFERRING MD:  Denton Brick, CHIEF COMPLAINT:  Drowsiness/dyspnea   Brief History:  40 y/o male with OHS/OSA and asthma intubated for acute on chronic hypercapnic respiratory failure.  Failed NIMV in ED, some oropharyngeal trauma on intubation attempt.  Past Medical History:  Asthma OHS/OSA on home O2 3L not yet adherent to BiPAP, followed in Dr[facility] clinic Diabetes HFpEF  Significant Hospital Events:  12/19 intubated  12/19 admitted 1/3 > Desaturation and near arrest.  Unable to bag through trach initially so code called for possible intubation from above.  Bagging then became easier, ? Plugging.  Had bronch performed but no plugs seen, only tracheomalacia.  1/4 > elevated peak pressures and distress, bedside bronch performed and trach noted to be up against posterior wall of pharynx.  Trach advanced and repositioned with improvement in peak pressures and pt comfort. 1/5 > ongoing bleeding from trach.  Trach wound looks a bit worse.  Calling ENT back to please re-evaluate-trach changed to Bivona adjustable flange  8.0 mm inner diameter 130 mm length 1/7 Unable to tolerate PS, worsening infiltrates on CXR  Consults:  ENT  Procedures:  12/19 ETT > 12/23 12/23 Trach >  1/7 Bronchoscopy to identify trach positioning  Significant Diagnostic Tests:  02/09/20 CXR with RUL infiltrate vs atelectasis new post-intubation relative to pre-intubation, retrocardiac opacity  1/7 CXR>>Increased bilateral confluent peripheral predominant heterogeneous opacities. Differential considerations include infection or confluent pulmonary edema/hemorrhage.  Micro Data:  MRSA screen 12/20 > positive Sputum 12/26>>  MRSA 12/20 blood > ngtd Blood 12/26 > staph caprae in 1/4 bottles 1/7 Respiratory culture > MRSA, Pseudomonas  Antimicrobials:  Ceftriaxone 12/19 > 12/21.   Azithromycin 12/19 > 12/21. Zosyn 12/25 > 12/26. Linezolid 12/27 > 1/2. Cefepime 1/9 > Vancomycin 1/9 >  Interim History / Subjective:  No respiratory complaints, remains on vent this morning C/o LLE soreness.   Objective   Blood pressure 123/77, pulse 82, temperature 98.5 F (36.9 C), temperature source Oral, resp. rate (!) 22, height 5\' 5"  (1.651 m), weight (!) 159.1 kg, SpO2 94 %.    Vent Mode: PSV;CPAP FiO2 (%):  [70 %-100 %] 70 % PEEP:  [10 cmH20] 10 cmH20 Pressure Support:  [10 cmH20-11 cmH20] 10 cmH20 Plateau Pressure:  [24 cmH20] 24 cmH20   Intake/Output Summary (Last 24 hours) at 03/02/2020 0755 Last data filed at 03/02/2020 0600 Gross per 24 hour  Intake 1676.68 ml  Output 2400 ml  Net -723.32 ml   Filed Weights   02/29/20 0500 03/01/20 0500 03/02/20 0349  Weight: (!) 160.7 kg (!) 158.2 kg (!) 159.1 kg   Examination: General: Obese male resting comfortably in bed. Rigors noted during exam.   Neuro: Alert, oriented, non-focal HEENT: [place]/AT. Trach site clean, dry. Dried blood in tubing, but no signs of active bleed. Trach secured at 14cm. Cardiovascular: RRR, no M/R/G.  Lungs: Coarse, distant.  Abdomen: BS x 4, soft, NT/ND.  Musculoskeletal: No acute deformity. No sig edema.  Skin: Grossly intact. No rash.    Assessment & Plan:   Acute on chronic hypoxemic respiratory failure  In setting of obesity hypoventilation syndrome and asthma with hx of difficult airway and tracheomalacia  - s/p trach by ENT and replaced with 69mm Bivona 1/5 - continues to have complications with trach extravasation leading to occlusion with coughing fits and oxygen desaturations P: - Continue Lasix 40mg  bid -  ENT managing trach, Likely will not be able to be de-cannulated without losing significant amount of weight and will likely not be able to down-size trach secondary to anatomy. - Continue vent support at night, PSV as tolerated today.  - Daily chest xray to monitor trach  position along with bedside evaluation of trach. Lurline Idol should be inserted to 14cm at all times. -PT/OT evaluations ongoing -continue duonebs, chest PT, Brovana, Yupelri. S/p mucomyst -VAP prevention bundle  MRSA, Pseudomonas tracheobronchitis.  -Bronchial hygiene -Cefepime for 7 day course - Suspect MRSA is colonized, low threshold to re-initiate therapy if worsens clinically.   Mucus plugging/ blood clotting (had trach-occlusive blood clot 1/9.) -Continue Chest PT  -Bronchial hygiene - S/p mucomyst - PRN bronchoscopy - ? Trach tube with exchangeable inner cannula may be beneficial - Continue to hold lovenox 80mg   x 1 more day (48 hours total)  Morbid obesity Resume diet if able to come off vent again - Cortrak placed 1/5 - continue TF  Muscular deconditioning PT working with patient as able  Uncontrolled hypertension Hyperlipidemia -Continue Coreg, Atorvastatin  Acute metabolic encephalopathy Improved -seroquel at night  Best practice (evaluated daily)  Diet: regular diet when off vent, continue TF with cortrak Pain/Anxiety/Delirium protocol (if indicated): minimize VAP protocol (if indicated): yes DVT prophylaxis: lovenox on hold GI prophylaxis: d/c Glucose control: monitor Mobility: up with assist Disposition: ICU  Goals of Care:  Last date of multidisciplinary goals of care discussion: 12/22 Family and staff present: mother Summary of discussion: full scope of practice Follow up goals of care discussion:  Mother [name] updated 1/10. Code Status: full  Critical care time 40 minutes  Georgann Housekeeper, AGACNP-BC [place] for personal pager PCCM on call pager [phone]  03/02/2020 8:24 AM

## 2020-03-02 NOTE — Progress Notes (Signed)
eLink Physician-Brief Progress Note Patient Name: Shawn Ramsey DOB: 1980-05-17 MRN: [id number]   Date of Service  03/02/2020  HPI/Events of Note  Oral Thrush  eICU Interventions  Plan: 1. Nystatin Oral Suspension 5 ml to mouth and throat now and QID,     Intervention Category Major Interventions: Other:  Lysle Dingwall 03/02/2020, 8:51 PM

## 2020-03-02 NOTE — Consult Note (Signed)
[facility] Nurse wound follow up Wound type: Stage 3 MDRPI; trach; one larger site noted on the patient's right side just adjacent to the tracheostomy, unclear if this may just be erosion from the trach tube no real pressure from trach plate on this site. Two areas full thickness noted left clavicle  Region that are healing nicely; crusted with serous drainage  Measurement:area on the right 2cm x 1cm aprox; difficult to measure with the trach in place; two areas on the left side 0.2cm x 0.2cm x 0.2cm  Wound bed: areas are clean, pink, the larger area is moist but the drainage seems to be well controlled with use of Drawtex dressings  Drainage (amount, consistency, odor) non purulent. Not significant amounts  Periwound: intact  Dressing procedure/placement/frequency: Continue hydro conductive dressing (Drawtex) drain dressing Kellie Simmering # [id number]). Change daily and PRN soilage.   [facility] Nurse team will follow with you and see patient within 10 days for wound assessments.  Please notify [facility] nurses of any acute changes in the wounds or any new areas of concern [place] MSN, [place], [place], [place]
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
## 2020-03-02 NOTE — Progress Notes (Signed)
VASCULAR LAB    Left lower extremity venous duplex has been performed.  See CV proc for preliminary results.   Abe Schools, RVT 03/02/2020, 1:54 PM

## 2020-03-03 ENCOUNTER — Inpatient Hospital Stay (HOSPITAL_COMMUNITY): Payer: Medicaid Other

## 2020-03-03 DIAGNOSIS — J40 Bronchitis, not specified as acute or chronic: Secondary | ICD-10-CM | POA: Diagnosis not present

## 2020-03-03 DIAGNOSIS — Z930 Tracheostomy status: Secondary | ICD-10-CM | POA: Diagnosis not present

## 2020-03-03 DIAGNOSIS — J9621 Acute and chronic respiratory failure with hypoxia: Secondary | ICD-10-CM | POA: Diagnosis not present

## 2020-03-03 DIAGNOSIS — J9622 Acute and chronic respiratory failure with hypercapnia: Secondary | ICD-10-CM | POA: Diagnosis not present

## 2020-03-03 LAB — CBC
HCT: 31.1 % — ABNORMAL LOW (ref 39.0–52.0)
Hemoglobin: 9.7 g/dL — ABNORMAL LOW (ref 13.0–17.0)
MCH: 27.7 pg (ref 26.0–34.0)
MCHC: 31.2 g/dL (ref 30.0–36.0)
MCV: 88.9 fL (ref 80.0–100.0)
Platelets: 427 10*3/uL — ABNORMAL HIGH (ref 150–400)
RBC: 3.5 MIL/uL — ABNORMAL LOW (ref 4.22–5.81)
RDW: 14.8 % (ref 11.5–15.5)
WBC: 12.1 10*3/uL — ABNORMAL HIGH (ref 4.0–10.5)
nRBC: 0 % (ref 0.0–0.2)

## 2020-03-03 LAB — BASIC METABOLIC PANEL
Anion gap: 10 (ref 5–15)
BUN: 23 mg/dL — ABNORMAL HIGH (ref 6–20)
CO2: 35 mmol/L — ABNORMAL HIGH (ref 22–32)
Calcium: 9.5 mg/dL (ref 8.9–10.3)
Chloride: 98 mmol/L (ref 98–111)
Creatinine, Ser: 0.8 mg/dL (ref 0.61–1.24)
GFR, Estimated: >60 mL/min (ref >60)
Glucose, Bld: 126 mg/dL — ABNORMAL HIGH (ref 70–99)
Potassium: 3.3 mmol/L — ABNORMAL LOW (ref 3.5–5.1)
Sodium: 143 mmol/L (ref 135–145)

## 2020-03-03 LAB — GLUCOSE, CAPILLARY
Glucose-Capillary: 131 mg/dL — ABNORMAL HIGH (ref 70–99)
Glucose-Capillary: 128 mg/dL — ABNORMAL HIGH (ref 70–99)
Glucose-Capillary: 106 mg/dL — ABNORMAL HIGH (ref 70–99)
Glucose-Capillary: 141 mg/dL — ABNORMAL HIGH (ref 70–99)
Glucose-Capillary: 124 mg/dL — ABNORMAL HIGH (ref 70–99)
Glucose-Capillary: 125 mg/dL — ABNORMAL HIGH (ref 70–99)

## 2020-03-03 LAB — PHOSPHORUS: Phosphorus: 5.1 mg/dL — ABNORMAL HIGH (ref 2.5–4.6)

## 2020-03-03 LAB — MAGNESIUM: Magnesium: 1.9 mg/dL (ref 1.7–2.4)

## 2020-03-03 MED ORDER — ENOXAPARIN SODIUM 80 MG/0.8ML ~~LOC~~ SOLN
80.0000 mg | SUBCUTANEOUS | Status: DC
  Administered 2020-03-03 – 2020-03-12 (×10): 80 mg via SUBCUTANEOUS
  Filled 2020-03-03 (×11): qty 0.8

## 2020-03-03 MED ORDER — POTASSIUM CHLORIDE 20 MEQ PO PACK
20.0000 meq | PACK | Freq: Once | ORAL | Status: AC
  Administered 2020-03-03: 20 meq
  Filled 2020-03-03: qty 1

## 2020-03-03 NOTE — Progress Notes (Signed)
eLink Physician-Brief Progress Note Patient Name: Shawn Ramsey DOB: [birth year] MRN: [id number]   Date of Service  03/03/2020  HPI/Events of Note  Hypokalemia - K+ = 3.3 and Creatinine = 0.8. Already on KCl 20 meq per tube daily.   eICU Interventions  Will order an additional KCl 20 meq per tube now.      Intervention Category Major Interventions: Electrolyte abnormality - evaluation and management  Azir Muzyka Eugene 03/03/2020, 6:05 AM

## 2020-03-03 NOTE — Progress Notes (Signed)
NAME:  Shawn Ramsey, MRN:  [id number], DOB:  1980-11-28, LOS: 6 ADMISSION DATE:  02/09/2020, CONSULTATION DATE:  12/24 REFERRING MD:  Denton Brick, CHIEF COMPLAINT:  Drowsiness/dyspnea   Brief History:  40 y/o male with OHS/OSA and asthma intubated for acute on chronic hypercapnic respiratory failure.  Failed NIMV in ED, some oropharyngeal trauma on intubation attempt.  Past Medical History:  Asthma OHS/OSA on home O2 3L not yet adherent to BiPAP, followed in Dr. Juanetta Gosling clinic Diabetes HFpEF  Significant Hospital Events:  12/19 intubated  12/19 admitted 1/3 > Desaturation and near arrest.  Unable to bag through trach initially so code called for possible intubation from above.  Bagging then became easier, ? Plugging.  Had bronch performed but no plugs seen, only tracheomalacia.  1/4 > elevated peak pressures and distress, bedside bronch performed and trach noted to be up against posterior wall of pharynx.  Trach advanced and repositioned with improvement in peak pressures and pt comfort. 1/5 > ongoing bleeding from trach.  Trach wound looks a bit worse.  Calling ENT back to please re-evaluate-trach changed to Bivona adjustable flange  8.0 mm inner diameter 130 mm length 1/7 Unable to tolerate PS, worsening infiltrates on CXR 1/10 tolerating PSV well.  Consults:  ENT  Procedures:  12/19 ETT > 12/23 12/23 Trach >  1/7 Bronchoscopy to identify trach positioning  Significant Diagnostic Tests:  02/09/20 CXR with RUL infiltrate vs atelectasis new post-intubation relative to pre-intubation, retrocardiac opacity  1/7 CXR>>Increased bilateral confluent peripheral predominant heterogeneous opacities. Differential considerations include infection or confluent pulmonary edema/hemorrhage.  Micro Data:  MRSA screen 12/20 > positive Sputum 12/26>>  MRSA 12/20 blood > ngtd Blood 12/26 > staph caprae in 1/4 bottles 1/7 Respiratory culture > MRSA, Pseudomonas  Antimicrobials:  Ceftriaxone  12/19 > 12/21.  Azithromycin 12/19 > 12/21. Zosyn 12/25 > 12/26. Linezolid 12/27 > 1/2. Cefepime 1/9 >  Interim History / Subjective:  No acute events overnight. Fio2 decreased to 40% this morning. PSV 8/5.  Objective   Blood pressure (!) 148/86, pulse 92, temperature 98.4 F (36.9 C), temperature source Oral, resp. rate (!) 21, height 5\' 5"  (1.651 m), weight (!) 159.1 kg, SpO2 (!) 89 %.    Vent Mode: PSV;CPAP FiO2 (%):  [40 %-70 %] 50 % PEEP:  [5 cmH20-10 cmH20] 5 cmH20 Pressure Support:  [8 cmH20-10 cmH20] 8 cmH20   Intake/Output Summary (Last 24 hours) at 03/03/2020 0744 Last data filed at 03/03/2020 0600 Gross per 24 hour  Intake 1894.26 ml  Output 1550 ml  Net 344.26 ml   Filed Weights   03/01/20 0500 03/02/20 0349 03/03/20 0500  Weight: (!) 158.2 kg (!) 159.1 kg (!) 159.1 kg   Examination:  General: Obese male in NAD on vent   Neuro: Alert, oriented, non-focal HEENT: [place]/AT. Trach site clean, dry. Some dark red clots being suctioned. Trach secured at 14cm Cardiovascular: RRR, no M/R/G.  Lungs: distant. No distress.  Abdomen: BS x 4, soft, NT/ND.  Musculoskeletal: No acute deformity. No sig edema.  Skin: Grossly intact.   Assessment & Plan:   Acute on chronic hypoxemic respiratory failure  In setting of obesity hypoventilation syndrome and asthma with hx of difficult airway and tracheomalacia  - s/p trach by ENT and replaced with 7mm Bivona 1/5 - has had complications with trach extravasation leading to occlusion with coughing fits and oxygen desaturations as recently as 1/9 P: - Continue Lasix 40mg  bid, K supplementation.  - ENT managing trach, Likely will not  be able to be de-cannulated without losing significant amount of weight and will likely not be able to down-size trach secondary to anatomy. - Will attempt trach collar trial today. Mandatory PSV at night.  - Daily chest xray to monitor trach position along with bedside evaluation of trach. Lurline Idol should be  inserted to 14cm at all times. -PT/OT evaluations ongoing -continue duonebs, chest PT, Brovana, Yupelri. S/p mucomyst -VAP prevention bundle  Pseudomonas tracheobronchitis.  Tracheal aspirate positive for MRSA -Bronchial hygiene -Cefepime for 7 day course - Suspect MRSA is colonized, low threshold to re-initiate therapy if worsens clinically.   Mucus plugging/ blood clotting (had trach-occlusive blood clot 1/9.) -Continue Chest PT  -Bronchial hygiene - ? Trach tube with exchangeable inner cannula may be beneficial - Restart lovenox today.   Morbid obesity Resume diet if able to come off vent again - Cortrak placed 1/5 - continue TF  - SLP to see today, he would like to be able to eat/drink on vent if able.   Muscular deconditioning -PT working with patient as able  Uncontrolled hypertension Hyperlipidemia -Continue Coreg, Atorvastatin  Anxiety - Seroquel continued  - PRN clonazepam.   Best practice (evaluated daily)  Diet: regular diet when off vent, continue TF with cortrak Pain/Anxiety/Delirium protocol (if indicated): minimize VAP protocol (if indicated): yes DVT prophylaxis: lovenox  GI prophylaxis: d/c Glucose control: SSI/Basal Mobility: up with assist Disposition: ICU  Goals of Care:  Last date of multidisciplinary goals of care discussion: 12/22 Family and staff present: mother Summary of discussion: full scope of practice Follow up goals of care discussion:  Mother [name] updated 1/10. Code Status: full  Critical care time 36 minutes  Georgann Housekeeper, AGACNP-BC [place] for personal pager PCCM on call pager [phone]  03/03/2020 7:44 AM
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
## 2020-03-04 DIAGNOSIS — A498 Other bacterial infections of unspecified site: Secondary | ICD-10-CM | POA: Diagnosis not present

## 2020-03-04 DIAGNOSIS — G9340 Encephalopathy, unspecified: Secondary | ICD-10-CM | POA: Diagnosis not present

## 2020-03-04 DIAGNOSIS — Z930 Tracheostomy status: Secondary | ICD-10-CM | POA: Diagnosis not present

## 2020-03-04 DIAGNOSIS — J9621 Acute and chronic respiratory failure with hypoxia: Secondary | ICD-10-CM | POA: Diagnosis not present

## 2020-03-04 LAB — CBC
HCT: 29.7 % — ABNORMAL LOW (ref 39.0–52.0)
Hemoglobin: 9.5 g/dL — ABNORMAL LOW (ref 13.0–17.0)
MCH: 28 pg (ref 26.0–34.0)
MCHC: 32 g/dL (ref 30.0–36.0)
MCV: 87.6 fL (ref 80.0–100.0)
Platelets: 396 K/uL (ref 150–400)
RBC: 3.39 MIL/uL — ABNORMAL LOW (ref 4.22–5.81)
RDW: 14.7 % (ref 11.5–15.5)
WBC: 9.7 K/uL (ref 4.0–10.5)
nRBC: 0 % (ref 0.0–0.2)

## 2020-03-04 LAB — BASIC METABOLIC PANEL
Anion gap: 13 (ref 5–15)
BUN: 19 mg/dL (ref 6–20)
CO2: 30 mmol/L (ref 22–32)
Calcium: 9 mg/dL (ref 8.9–10.3)
Chloride: 95 mmol/L — ABNORMAL LOW (ref 98–111)
Creatinine, Ser: 0.66 mg/dL (ref 0.61–1.24)
GFR, Estimated: >60 mL/min (ref >60)
Glucose, Bld: 135 mg/dL — ABNORMAL HIGH (ref 70–99)
Potassium: 3.5 mmol/L (ref 3.5–5.1)
Sodium: 138 mmol/L (ref 135–145)

## 2020-03-04 LAB — GLUCOSE, CAPILLARY
Glucose-Capillary: 146 mg/dL — ABNORMAL HIGH (ref 70–99)
Glucose-Capillary: 112 mg/dL — ABNORMAL HIGH (ref 70–99)
Glucose-Capillary: 156 mg/dL — ABNORMAL HIGH (ref 70–99)
Glucose-Capillary: 109 mg/dL — ABNORMAL HIGH (ref 70–99)
Glucose-Capillary: 115 mg/dL — ABNORMAL HIGH (ref 70–99)
Glucose-Capillary: 101 mg/dL — ABNORMAL HIGH (ref 70–99)

## 2020-03-04 MED ORDER — NICOTINE 7 MG/24HR TD PT24
7.0000 mg | MEDICATED_PATCH | Freq: Every day | TRANSDERMAL | Status: DC
  Administered 2020-03-04: 7 mg via TRANSDERMAL
  Filled 2020-03-04 (×2): qty 1

## 2020-03-04 NOTE — Progress Notes (Addendum)
NAME:  Shawn Ramsey, MRN:  [id number], DOB:  05-12-1980, LOS: 24 ADMISSION DATE:  02/09/2020, CONSULTATION DATE:  12/24 REFERRING MD:  Denton Brick, CHIEF COMPLAINT:  Drowsiness/dyspnea   Brief History:  40 y/o male with OHS/OSA and asthma intubated for acute on chronic hypercapnic respiratory failure.  Failed NIMV in ED, some oropharyngeal trauma on intubation attempt.  Past Medical History:  Asthma OHS/OSA on home O2 3L not yet adherent to BiPAP, followed in Dr. Juanetta [facility] clinic Diabetes HFpEF  Significant Hospital Events:  12/19 intubated  12/19 admitted 1/3 > Desaturation and near arrest.  Unable to bag through trach initially so code called for possible intubation from above.  Bagging then became easier, ? Plugging.  Had bronch performed but no plugs seen, only tracheomalacia.  1/4 > elevated peak pressures and distress, bedside bronch performed and trach noted to be up against posterior wall of pharynx.  Trach advanced and repositioned with improvement in peak pressures and pt comfort. 1/5 > ongoing bleeding from trach.  Trach wound looks a bit worse.  Calling ENT back to please re-evaluate-trach changed to Bivona adjustable flange  8.0 mm inner diameter 130 mm length 1/7 Unable to tolerate PS, worsening infiltrates on CXR 1/13 plan for t collar Consults:  ENT  Procedures:  12/19 ETT > 12/23 12/23 Trach >  1/7 Bronchoscopy to identify trach positioning  Significant Diagnostic Tests:  02/09/20 CXR with RUL infiltrate vs atelectasis new post-intubation relative to pre-intubation, retrocardiac opacity  1/7 CXR>>Increased bilateral confluent peripheral predominant heterogeneous opacities. Differential considerations include infection or confluent pulmonary edema/hemorrhage.  Micro Data:  MRSA screen 12/20 > positive Sputum 12/26>>  MRSA 12/20 blood > ngtd Blood 12/26 > staph caprae in 1/4 bottles 1/7 Respiratory culture > MRSA presumed colinizer, Pseudomonas ss  cefepime  Antimicrobials:  Ceftriaxone 12/19 > 12/21.  Azithromycin 12/19 > 12/21. Zosyn 12/25 > 12/26. Linezolid 12/27 > 1/2. Cefepime 1/9 > Vancomycin 1/9 >off  Interim History / Subjective:  Reports tracheostomy leaking with manipulation of head. Ports unable to swallow his food Objective   Blood pressure (!) 149/89, pulse 84, temperature 98.3 F (36.8 C), temperature source Oral, resp. rate 20, height 5\' 5"  (1.651 m), weight (!) 158.9 kg, SpO2 93 %.    Vent Mode: PSV;CPAP FiO2 (%):  [40 %-80 %] 40 % PEEP:  [5 cmH20] 5 cmH20 Pressure Support:  [8 cmH20] 8 cmH20   Intake/Output Summary (Last 24 hours) at 03/04/2020 2130 Last data filed at 03/04/2020 0700 Gross per 24 hour  Intake 1768.16 ml  Output 2003 ml  Net -234.84 ml   Filed Weights   03/02/20 0349 03/03/20 0500 03/04/20 0400  Weight: (!) 159.1 kg (!) 159.1 kg (!) 158.9 kg   Examination: General: Morbidly obese male currently on full mechanical ventilatory support but has been transitioned to pressure support ventilation with goal of going on trach collar today. HEENT: #8 Bivona air trach patent and clear good tidal volume return, it is occasionally removes his head which is normal. Neuro: Grossly intact without focal defect.  He has diet at bedside but says he cannot swallow it we will continue to monitor. CV: Heart sounds irregular regular rate rhythm PULM: Diminished throughout Vent currently on pressure support ventilation FIO2 40% PEEP5 VT ps 8 460 cc rr 19  GI: soft, bsx4 active obese soft GU: Amber urine Extremities: warm/dry, 2+ edema  Skin: no rashes or lesions    Intake/Output Summary (Last 24 hours) at 03/04/2020 0817 Last data filed at 03/04/2020  0700 Gross per 24 hour  Intake 1768.16 ml  Output 2003 ml  Net -234.84 ml    Assessment & Plan:   Acute on chronic hypoxemic respiratory failure  In setting of obesity hypoventilation syndrome and asthma with hx of difficult airway and  tracheomalacia  - s/p trach by ENT and replaced with 24mm Bivona 1/5 - continues to have complications with trach extravasation leading to occlusion with coughing fits and oxygen desaturations P: Diuresis as tolerated reviewed -234 cc x 24 hours ENT managing trach.  Consideration for changing trach in the future.  Doubt he will be able to liberate from tracheostomy anytime in the near future.  As he continues to manifest OHS physiology with elevated PCO2 with normal pH Resume Lovenox Continue physical therapy Holding on treatment of MRSA colonizer at this time.     MRSA, Pseudomonas tracheobronchitis.  Currently on cefepime day 3 of 7 MRSA considered colonizer but can add vancomycin if needed depending on systemic reaction to possible infection is noted     Mucus plugging/ blood clotting (had trach-occlusive blood clot 1/9.) Pulmonary toilet Post Mucomyst As needed bronchoscopy Currently has a Bivona air trach in place May need to consider different trach but trach was placed by ENT therefore will need their input prior to any change due to posterior damage from intubation. Consider resuming Lovenox    Morbid obesity Diet has been ordered, will need to be able to tolerate trach collar Continue core track that was placed on 02/26/2020 Continue tube feedings  Muscular deconditioning Continue physical therapy interventions and increasing mobility.  Uncontrolled hypertension Hyperlipidemia Continue Increase  Coreg 12.5 03/04/20 and ARB  Acute metabolic encephalopathy Improved Continue Seroquel nightly  Best practice (evaluated daily)  Diet: regular diet when off vent, continue TF with cortrak Pain/Anxiety/Delirium protocol (if indicated): minimize VAP protocol (if indicated): yes DVT prophylaxis: lovenox on hold GI prophylaxis: d/c Glucose control: monitor Mobility: up with assist Disposition: ICU  Goals of Care:  Last date of multidisciplinary goals of care  discussion: 12/22 Family and staff present: mother Summary of discussion: full scope of practice Follow up goals of care discussion:  Mother [name] updated 1/10. Code Status: full 03/04/2020 patient updated at bedside  App cct 34 min  Richardson Landry Harley Fitzwater ACNP Acute Care Nurse Practitioner [place] Please consult [facility] 03/04/2020, 8:07 AM

## 2020-03-04 NOTE — Progress Notes (Addendum)
Nutrition Follow-up  DOCUMENTATION CODES:   Morbid obesity  INTERVENTION:   D/C TF today, but leave Cortrak in place. If patient is able to tolerate trach collar and eat all of his meals today, could d/c Cortrak tomorrow. If intake is poor or he is unable to stay on trach collar for all meals, can resume nocturnal TF tomorrow.  Will add Ensure Max po BID to maximize oral protein intake, each supplement provides 150 kcal and 30 grams of protein.   NUTRITION DIAGNOSIS:   Inadequate oral intake related to inability to eat as evidenced by NPO status.  Ongoing, diet just advanced.  GOAL:   Patient will meet greater than or equal to 90% of their needs  Met  MONITOR:   Vent status,Labs,TF tolerance,Weight trends  REASON FOR ASSESSMENT:   Ventilator,Consult Enteral/tube feeding initiation and management  ASSESSMENT:   40 yo male admitted with acute on chronic hypercapnic respiratory failure. Required intubation in the ED. PMH includes OHS/OSA (home oxygen 3 L), DM, HF, asthma, COPD, cognitive developmental delay, psychotic disorder.  Cortrak tube placed 1/5, tip in stomach per X-ray. Currently receiving Jevity 1.5 via Cortrak tube at 55 ml/h (1320 ml/day) with Prosource TF 45 ml QID to provide 2140 kcals, 128 gm protein, 1003 ml free water daily.   Currently on trach collar.  S/P swallow evaluation with SLP 1/11, diet advanced to regular. Patient must be on trach collar to eat. He states he is hungry and ate all of his breakfast this morning. Expect intake will be adequate.  Will d/c tube feeding today. If intake of meals is poor, can resume TF tomorrow. If intake of meals is adequate, can d/c Cortrak tomorrow.   Labs reviewed.  CBG: 146-112  Medications reviewed and include Colace, Lasix, potassium chloride.  Weight 158.9 kg today, down from 172.6 kg on admission 12/20.  Diet Order:   Diet Order            Diet regular Room service appropriate? Yes; Fluid consistency:  Thin  Diet effective now                 EDUCATION NEEDS:   No education needs have been identified at this time  Skin:  Skin Assessment: Skin Integrity Issues: Skin Integrity Issues:: Stage II Stage II: R neck Other: -  Last BM:  1/11 type 6  Height:   Ht Readings from Last 1 Encounters:  02/10/20 5' 5"  (1.651 m)    Weight:   Wt Readings from Last 1 Encounters:  03/04/20 (!) 158.9 kg    Ideal Body Weight:  61.8 kg  BMI:  Body mass index is 58.29 kg/m.  Estimated Nutritional Needs:   Kcal:  2200-2400  Protein:  120-140 grams  Fluid:  >2 L/day   Lucas Mallow, RD, LDN, CNSC Please refer to Amion for contact information.
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
## 2020-03-05 ENCOUNTER — Inpatient Hospital Stay (HOSPITAL_COMMUNITY): Payer: Medicaid Other

## 2020-03-05 DIAGNOSIS — R6889 Other general symptoms and signs: Secondary | ICD-10-CM | POA: Diagnosis not present

## 2020-03-05 DIAGNOSIS — A498 Other bacterial infections of unspecified site: Secondary | ICD-10-CM

## 2020-03-05 DIAGNOSIS — J9621 Acute and chronic respiratory failure with hypoxia: Secondary | ICD-10-CM | POA: Diagnosis not present

## 2020-03-05 DIAGNOSIS — Z930 Tracheostomy status: Secondary | ICD-10-CM | POA: Diagnosis not present

## 2020-03-05 LAB — BASIC METABOLIC PANEL
Anion gap: 12 (ref 5–15)
BUN: 15 mg/dL (ref 6–20)
CO2: 30 mmol/L (ref 22–32)
Calcium: 9.3 mg/dL (ref 8.9–10.3)
Chloride: 95 mmol/L — ABNORMAL LOW (ref 98–111)
Creatinine, Ser: 0.73 mg/dL (ref 0.61–1.24)
GFR, Estimated: >60 mL/min (ref >60)
Glucose, Bld: 110 mg/dL — ABNORMAL HIGH (ref 70–99)
Potassium: 3.8 mmol/L (ref 3.5–5.1)
Sodium: 137 mmol/L (ref 135–145)

## 2020-03-05 LAB — CBC
HCT: 30.6 % — ABNORMAL LOW (ref 39.0–52.0)
Hemoglobin: 10 g/dL — ABNORMAL LOW (ref 13.0–17.0)
MCH: 28.5 pg (ref 26.0–34.0)
MCHC: 32.7 g/dL (ref 30.0–36.0)
MCV: 87.2 fL (ref 80.0–100.0)
Platelets: 419 10*3/uL — ABNORMAL HIGH (ref 150–400)
RBC: 3.51 MIL/uL — ABNORMAL LOW (ref 4.22–5.81)
RDW: 14.6 % (ref 11.5–15.5)
WBC: 10.3 10*3/uL (ref 4.0–10.5)
nRBC: 0 % (ref 0.0–0.2)

## 2020-03-05 LAB — GLUCOSE, CAPILLARY
Glucose-Capillary: 84 mg/dL (ref 70–99)
Glucose-Capillary: 99 mg/dL (ref 70–99)
Glucose-Capillary: 106 mg/dL — ABNORMAL HIGH (ref 70–99)
Glucose-Capillary: 113 mg/dL — ABNORMAL HIGH (ref 70–99)
Glucose-Capillary: 126 mg/dL — ABNORMAL HIGH (ref 70–99)

## 2020-03-05 LAB — MAGNESIUM: Magnesium: 1.8 mg/dL (ref 1.7–2.4)

## 2020-03-05 LAB — PHOSPHORUS: Phosphorus: 4.9 mg/dL — ABNORMAL HIGH (ref 2.5–4.6)

## 2020-03-05 MED ORDER — SENNA 8.6 MG PO TABS
2.0000 | ORAL_TABLET | Freq: Two times a day (BID) | ORAL | Status: DC
  Administered 2020-03-06 – 2020-03-13 (×5): 17.2 mg via ORAL
  Filled 2020-03-05 (×11): qty 2

## 2020-03-05 MED ORDER — POLYETHYLENE GLYCOL 3350 17 G PO PACK: 17.0000 g | PACK | Freq: Every day | ORAL | Status: DC | PRN

## 2020-03-05 MED ORDER — QUETIAPINE FUMARATE 50 MG PO TABS
50.0000 mg | ORAL_TABLET | Freq: Every day | ORAL | Status: DC
  Administered 2020-03-05: 50 mg via ORAL
  Filled 2020-03-05: qty 1

## 2020-03-05 MED ORDER — CLONAZEPAM 0.5 MG PO TABS
0.5000 mg | ORAL_TABLET | Freq: Two times a day (BID) | ORAL | Status: DC | PRN
  Administered 2020-03-05 – 2020-03-06 (×3): 0.5 mg via ORAL
  Filled 2020-03-05 (×3): qty 1

## 2020-03-05 MED ORDER — CARVEDILOL 6.25 MG PO TABS
6.2500 mg | ORAL_TABLET | Freq: Two times a day (BID) | ORAL | Status: DC
Start: 2020-03-05 — End: 2020-03-13
  Administered 2020-03-05 – 2020-03-13 (×15): 6.25 mg via ORAL
  Filled 2020-03-05 (×15): qty 1

## 2020-03-05 MED ORDER — POLYETHYLENE GLYCOL 3350 17 G PO PACK
17.0000 g | PACK | Freq: Every day | ORAL | Status: DC
  Administered 2020-03-08 – 2020-03-13 (×2): 17 g via ORAL
  Filled 2020-03-05 (×5): qty 1

## 2020-03-05 MED ORDER — GUAIFENESIN 100 MG/5ML PO SOLN
5.0000 mL | ORAL | Status: DC | PRN
  Administered 2020-03-05 – 2020-03-13 (×17): 100 mg via ORAL
  Filled 2020-03-05: qty 5
  Filled 2020-03-05: qty 10
  Filled 2020-03-05: qty 25
  Filled 2020-03-05 (×14): qty 5

## 2020-03-05 MED ORDER — ACETAMINOPHEN 160 MG/5ML PO SOLN
650.0000 mg | Freq: Four times a day (QID) | ORAL | Status: DC | PRN
  Administered 2020-03-08 – 2020-03-12 (×5): 650 mg via ORAL
  Filled 2020-03-05 (×5): qty 20.3

## 2020-03-05 MED ORDER — ORAL CARE MOUTH RINSE
15.0000 mL | Freq: Two times a day (BID) | OROMUCOSAL | Status: DC
  Administered 2020-03-05 – 2020-03-13 (×12): 15 mL via OROMUCOSAL

## 2020-03-05 MED ORDER — DOCUSATE SODIUM 100 MG PO CAPS
100.0000 mg | ORAL_CAPSULE | Freq: Two times a day (BID) | ORAL | Status: DC
  Administered 2020-03-05 – 2020-03-13 (×8): 100 mg via ORAL
  Filled 2020-03-05 (×12): qty 1

## 2020-03-05 MED ORDER — LORATADINE 10 MG PO TABS
10.0000 mg | ORAL_TABLET | Freq: Every day | ORAL | Status: DC
  Administered 2020-03-05 – 2020-03-13 (×9): 10 mg via ORAL
  Filled 2020-03-05 (×9): qty 1

## 2020-03-05 MED ORDER — PANTOPRAZOLE SODIUM 40 MG PO TBEC
40.0000 mg | DELAYED_RELEASE_TABLET | Freq: Every day | ORAL | Status: DC
  Administered 2020-03-05 – 2020-03-13 (×9): 40 mg via ORAL
  Filled 2020-03-05 (×9): qty 1

## 2020-03-05 MED ORDER — POTASSIUM CHLORIDE CRYS ER 20 MEQ PO TBCR
20.0000 meq | EXTENDED_RELEASE_TABLET | Freq: Every day | ORAL | Status: DC
  Administered 2020-03-05 – 2020-03-13 (×9): 20 meq via ORAL
  Filled 2020-03-05 (×9): qty 1

## 2020-03-05 MED ORDER — ATORVASTATIN CALCIUM 40 MG PO TABS
40.0000 mg | ORAL_TABLET | Freq: Every day | ORAL | Status: DC
  Administered 2020-03-05 – 2020-03-13 (×9): 40 mg via ORAL
  Filled 2020-03-05 (×9): qty 1
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
## 2020-03-05 NOTE — Progress Notes (Signed)
NAME:  Shawn Ramsey, MRN:  [id number], DOB:  1981-02-10, LOS: 54 ADMISSION DATE:  02/09/2020, CONSULTATION DATE:  12/24 REFERRING MD:  Denton Brick, CHIEF COMPLAINT:  Drowsiness/dyspnea   Brief History:  40 y/o male with OHS/OSA and asthma intubated for acute on chronic hypercapnic respiratory failure.  Failed NIMV in ED, some oropharyngeal trauma on intubation attempt.  Past Medical History:  Asthma OHS/OSA on home O2 3L not yet adherent to BiPAP, followed in Dr. Juanetta Gosling clinic Diabetes HFpEF  Significant Hospital Events:  12/19 intubated  12/19 admitted 1/3 > Desaturation and near arrest.  Unable to bag through trach initially so code called for possible intubation from above.  Bagging then became easier, ? Plugging.  Had bronch performed but no plugs seen, only tracheomalacia.  1/4 > elevated peak pressures and distress, bedside bronch performed and trach noted to be up against posterior wall of pharynx.  Trach advanced and repositioned with improvement in peak pressures and pt comfort. 1/5 > ongoing bleeding from trach.  Trach wound looks a bit worse.  Calling ENT back to please re-evaluate-trach changed to Bivona adjustable flange  8.0 mm inner diameter 130 mm length 1/7 Unable to tolerate PS, worsening infiltrates on CXR 1/12  plan for t collar 1/13 24 hrs trach tolerance  Consults:  ENT  Procedures:  12/19 ETT > 12/23 12/23 Trach >  1/7 Bronchoscopy to identify trach positioning  Significant Diagnostic Tests:  02/09/20 CXR with RUL infiltrate vs atelectasis new post-intubation relative to pre-intubation, retrocardiac opacity  1/7 CXR>>Increased bilateral confluent peripheral predominant heterogeneous opacities. Differential considerations include infection or confluent pulmonary edema/hemorrhage.  Micro Data:  MRSA screen 12/20 > positive Sputum 12/26>>  MRSA 12/20 blood > ngtd Blood 12/26 > staph caprae in 1/4 bottles 1/7 Respiratory culture > MRSA presumed colinizer,  Pseudomonas ss cefepime  Antimicrobials:  Ceftriaxone 12/19 > 12/21.  Azithromycin 12/19 > 12/21. Zosyn 12/25 > 12/26. Linezolid 12/27 > 1/2. Cefepime 1/9 > Vancomycin 1/9 >off  Interim History / Subjective:  24 hours off ventilator.  He is able to eat.  We will remove core track. Objective   Blood pressure (!) 146/81, pulse 91, temperature 98.3 F (36.8 C), temperature source Oral, resp. rate (!) 24, height 5\' 5"  (1.651 m), weight (!) 158.9 kg, SpO2 93 %.    FiO2 (%):  [40 %-60 %] 40 %   Intake/Output Summary (Last 24 hours) at 03/05/2020 0758 Last data filed at 03/05/2020 0410 Gross per 24 hour  Intake 1432.21 ml  Output 2275 ml  Net -842.79 ml   Filed Weights   03/03/20 0500 03/04/20 0400 03/05/20 0500  Weight: (!) 159.1 kg (!) 158.9 kg (!) 158.9 kg   Examination: General: Morbidly obese male who sitting up in a chair has been off ventilator for 24 hours appears much more interactive.  Able to eat. HEENT: #8 Bivona air trach is in place.  No further mucous plugging at this time. Neuro: Grossly intact without focal defect CV: Heart sounds irregular regular rate rhythm PULM: Diminished breath sounds in the bases.  Currently on trach collar at 40% GI: soft, bsx4 active, obese Extremities: warm/dry, 2-3+ lower extremity edema  Skin: no rashes or lesions     Intake/Output Summary (Last 24 hours) at 03/05/2020 0758 Last data filed at 03/05/2020 0410 Gross per 24 hour  Intake 1432.21 ml  Output 2275 ml  Net -842.79 ml    Assessment & Plan:   Acute on chronic hypoxemic respiratory failure  In setting of  obesity hypoventilation syndrome and asthma with hx of difficult airway and tracheomalacia  - s/p trach by ENT and replaced with 38mm Bivona 1/5 - continues to have complications with trach extravasation leading to occlusion with coughing fits and oxygen desaturations P: Diuresis as tolerated continue with IV Lasix for at least 24 more hours.  Noted to be -842 for 24  hours ENT is managing trach.  Consideration for changing in the future. Leave on trach collar as tolerated.  Once she is 48 hours off of ventilator will transition to stepdown care. Continue to advance activity Continue p.o. diet   Diuresis as tolerated reviewed -234 cc x 24 hours ENT managing trach.  Consideration for changing trach in the future.  Doubt he will be able to liberate from tracheostomy anytime in the near future.  As he continues to manifest OHS physiology with elevated PCO2 with normal pH Resume Lovenox Continue physical therapy Holding on treatment of MRSA colonizer at this time.     MRSA, Pseudomonas tracheobronchitis.  Currently on cefepime day 4 of 7 MRSA considered colonizer but can add vancomycin if needed depending on systemic reaction to possible infection is noted     Mucus plugging/ blood clotting (had trach-occlusive blood clot 1/9.) Continue pulmonary toilet Suctioning as needed May need different trach in the future but continue with robotic air for now Lovenox has been resumed      Morbid obesity Taking p.o. diet We will remove core track  Muscular deconditioning Currently out of bed receiving PT and OT  Uncontrolled hypertension Hyperlipidemia Continue antihypertensives at current rate  Acute metabolic encephalopathy Improving daily Continue Seroquel for now  Best practice (evaluated daily)  Diet: regular diet when off vent, continue TF with cortrak Pain/Anxiety/Delirium protocol (if indicated): minimize VAP protocol (if indicated): yes DVT prophylaxis: lovenox on hold GI prophylaxis: d/c Glucose control: monitor Mobility: up with assist Disposition:  03/05/2020 currently in intensive care unit but may be able to transfer out to stepdown unit within 24 hours.  Goals of Care:  Last date of multidisciplinary goals of care discussion: 12/22 Family and staff present: mother Summary of discussion: full scope of practice Follow up  goals of care discussion:  Mother [name] updated 1/10. Code Status: full 03/05/2020 patient updated at bedside   [place] Nurse Practitioner [employer] Please consult [place] 03/05/2020, 7:58 AM

## 2020-03-06 DIAGNOSIS — J9621 Acute and chronic respiratory failure with hypoxia: Secondary | ICD-10-CM | POA: Diagnosis not present

## 2020-03-06 DIAGNOSIS — J9622 Acute and chronic respiratory failure with hypercapnia: Secondary | ICD-10-CM | POA: Diagnosis not present

## 2020-03-06 LAB — CBC
HCT: 32.2 % — ABNORMAL LOW (ref 39.0–52.0)
Hemoglobin: 9.8 g/dL — ABNORMAL LOW (ref 13.0–17.0)
MCH: 27.2 pg (ref 26.0–34.0)
MCHC: 30.4 g/dL (ref 30.0–36.0)
MCV: 89.4 fL (ref 80.0–100.0)
Platelets: 428 10*3/uL — ABNORMAL HIGH (ref 150–400)
RBC: 3.6 MIL/uL — ABNORMAL LOW (ref 4.22–5.81)
RDW: 14.3 % (ref 11.5–15.5)
WBC: 9.6 10*3/uL (ref 4.0–10.5)
nRBC: 0.3 % — ABNORMAL HIGH (ref 0.0–0.2)

## 2020-03-06 LAB — BASIC METABOLIC PANEL
Anion gap: 12 (ref 5–15)
BUN: 16 mg/dL (ref 6–20)
CO2: 28 mmol/L (ref 22–32)
Calcium: 9.4 mg/dL (ref 8.9–10.3)
Chloride: 97 mmol/L — ABNORMAL LOW (ref 98–111)
Creatinine, Ser: 0.65 mg/dL (ref 0.61–1.24)
GFR, Estimated: >60 mL/min (ref >60)
Glucose, Bld: 97 mg/dL (ref 70–99)
Potassium: 4.1 mmol/L (ref 3.5–5.1)
Sodium: 137 mmol/L (ref 135–145)

## 2020-03-06 LAB — GLUCOSE, CAPILLARY
Glucose-Capillary: 100 mg/dL — ABNORMAL HIGH (ref 70–99)
Glucose-Capillary: 100 mg/dL — ABNORMAL HIGH (ref 70–99)
Glucose-Capillary: 107 mg/dL — ABNORMAL HIGH (ref 70–99)
Glucose-Capillary: 114 mg/dL — ABNORMAL HIGH (ref 70–99)
Glucose-Capillary: 120 mg/dL — ABNORMAL HIGH (ref 70–99)
Glucose-Capillary: 121 mg/dL — ABNORMAL HIGH (ref 70–99)
Glucose-Capillary: 94 mg/dL (ref 70–99)

## 2020-03-06 MED ORDER — QUETIAPINE FUMARATE 25 MG PO TABS
25.0000 mg | ORAL_TABLET | Freq: Every day | ORAL | Status: AC
  Administered 2020-03-06 – 2020-03-07 (×2): 25 mg via ORAL
  Filled 2020-03-06 (×2): qty 1

## 2020-03-06 MED ORDER — FUROSEMIDE 40 MG PO TABS
40.0000 mg | ORAL_TABLET | Freq: Two times a day (BID) | ORAL | Status: DC
  Administered 2020-03-06 – 2020-03-08 (×6): 40 mg via ORAL
  Filled 2020-03-06 (×6): qty 1

## 2020-03-06 MED ORDER — ADULT MULTIVITAMIN W/MINERALS CH
1.0000 | ORAL_TABLET | Freq: Every day | ORAL | Status: DC
  Administered 2020-03-06 – 2020-03-13 (×8): 1 via ORAL
  Filled 2020-03-06 (×8): qty 1

## 2020-03-06 MED ORDER — ENSURE MAX PROTEIN PO LIQD
11.0000 [oz_av] | Freq: Two times a day (BID) | ORAL | Status: DC
  Administered 2020-03-06 – 2020-03-13 (×9): 11 [oz_av] via ORAL
  Filled 2020-03-06 (×16): qty 330
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
## 2020-03-06 NOTE — Progress Notes (Signed)
Nutrition Follow-up  DOCUMENTATION CODES:   Morbid obesity  INTERVENTION:    Ensure Max po BID to maximize oral protein intake, each supplement provides 150 kcal and 30 grams of protein.   MVI with minerals daily.  NUTRITION DIAGNOSIS:   Inadequate oral intake related to inability to eat as evidenced by NPO status.  Resolved.  GOAL:   Patient will meet greater than or equal to 90% of their needs  Met  MONITOR:   Vent status,Labs,TF tolerance,Weight trends  REASON FOR ASSESSMENT:   Ventilator,Consult Enteral/tube feeding initiation and management  ASSESSMENT:   40 yo male admitted with acute on chronic hypercapnic respiratory failure. Required intubation in the ED. PMH includes OHS/OSA (home oxygen 3 L), DM, HF, asthma, COPD, cognitive developmental delay, psychotic disorder.  Cortrak removed 1/13. Remains on trach collar.  Currently on a regular diet. Meal intakes recorded at 60-100%, average 88%.   Labs reviewed.  CBG: 107-100  Medications reviewed and include Colace, Lasix, Miralax, potassium chloride, Senokot.  Weight 158.9 kg x 3 days, down from 172.6 kg on admission 12/20.  Diet Order:   Diet Order            Diet regular Room service appropriate? Yes; Fluid consistency: Thin  Diet effective now                 EDUCATION NEEDS:   No education needs have been identified at this time  Skin:  Skin Assessment: Skin Integrity Issues: Skin Integrity Issues:: Stage II Stage II: R neck Other: -  Last BM:  1/13 type 6  Height:   Ht Readings from Last 1 Encounters:  02/10/20 5' 5"  (1.651 m)    Weight:   Wt Readings from Last 1 Encounters:  03/06/20 (!) 158.9 kg    Ideal Body Weight:  61.8 kg  BMI:  Body mass index is 58.29 kg/m.  Estimated Nutritional Needs:   Kcal:  2200-2400  Protein:  120-140 grams  Fluid:  >2 L/day   Lucas Mallow, RD, LDN, CNSC Please refer to Amion for contact information.

## 2020-03-06 NOTE — Progress Notes (Signed)
Ordered geo mat per OT. Patient comfortably on mat in chair.

## 2020-03-06 NOTE — Progress Notes (Signed)
NAME:  Shawn Ramsey, MRN:  [id number], DOB:  October 07, 1980, LOS: 30 ADMISSION DATE:  02/09/2020, CONSULTATION DATE:  12/24 REFERRING MD:  Denton Brick, CHIEF COMPLAINT:  Drowsiness/dyspnea   Brief History:  40 y/o male with OHS/OSA and asthma intubated for acute on chronic hypercapnic respiratory failure.  Failed NIMV in ED, some oropharyngeal trauma on intubation attempt.  Past Medical History:  Asthma OHS/OSA on home O2 3L not yet adherent to BiPAP, followed in [facility] Diabetes HFpEF  Significant Hospital Events:  12/19 intubated  12/19 admitted 1/3 > Desaturation and near arrest.  Unable to bag through trach initially so code called for possible intubation from above.  Bagging then became easier, ? Plugging.  Had bronch performed but no plugs seen, only tracheomalacia.  1/4 > elevated peak pressures and distress, bedside bronch performed and trach noted to be up against posterior wall of pharynx.  Trach advanced and repositioned with improvement in peak pressures and pt comfort. 1/5 > ongoing bleeding from trach.  Trach wound looks a bit worse.  Calling ENT back to please re-evaluate-trach changed to Bivona adjustable flange  8.0 mm inner diameter 130 mm length 1/7 Unable to tolerate PS, worsening infiltrates on CXR 1/12  plan for t collar 1/13 24 hrs trach tolerance 03/06/2020  Consults:  ENT  Procedures:  12/19 ETT > 12/23 12/23 Trach >  1/7 Bronchoscopy to identify trach positioning  Significant Diagnostic Tests:  02/09/20 CXR with RUL infiltrate vs atelectasis new post-intubation relative to pre-intubation, retrocardiac opacity  1/7 CXR>>Increased bilateral confluent peripheral predominant heterogeneous opacities. Differential considerations include infection or confluent pulmonary edema/hemorrhage.  Micro Data:  MRSA screen 12/20 > positive Sputum 12/26>>  MRSA 12/20 blood > ngtd Blood 12/26 > staph caprae in 1/4 bottles 1/7 Respiratory culture > MRSA presumed  colinizer, Pseudomonas ss cefepime  Antimicrobials:  Ceftriaxone 12/19 > 12/21.  Azithromycin 12/19 > 12/21. Zosyn 12/25 > 12/26. Linezolid 12/27 > 1/2. Cefepime 1/9 > Vancomycin 1/9 >off  Interim History / Subjective:  48 hours off ventilator, able to ambulate in unit with PT assistance. Transfer to stepdown unit. Objective   Blood pressure 125/79, pulse 84, temperature 98.4 F (36.9 C), temperature source Oral, resp. rate 19, height 5\' 5"  (1.651 m), weight (!) 158.9 kg, SpO2 92 %.    FiO2 (%):  [40 %] 40 %   Intake/Output Summary (Last 24 hours) at 03/06/2020 0742 Last data filed at 03/06/2020 0500 Gross per 24 hour  Intake 503.73 ml  Output 1100 ml  Net -596.27 ml   Filed Weights   03/04/20 0400 03/05/20 0500 03/06/20 0353  Weight: (!) 158.9 kg (!) 158.9 kg (!) 158.9 kg   Examination: General: Awake alert sitting in a chair. Slept in a chair at her . Tolerating trach collar for over 48 hours now. HEENT: #8 Bivona air trach is in place currently on trach collar Neuro: Grossly intact without focal defect CV: Heart sounds are regular PULM: Diminished breath sounds throughout  GI: soft, bsx4 active, obese Extremities: warm/dry, 2-3+ edema  Skin: no rashes or lesions      Intake/Output Summary (Last 24 hours) at 03/06/2020 0742 Last data filed at 03/06/2020 0500 Gross per 24 hour  Intake 503.73 ml  Output 1100 ml  Net -596.27 ml    Assessment & Plan:   Acute on chronic hypoxemic respiratory failure  In setting of obesity hypoventilation syndrome and asthma with hx of difficult airway and tracheomalacia  - s/p trach by ENT and replaced  with 75mm Bivona 1/5 - continues to have complications with trach extravasation leading to occlusion with coughing fits and oxygen desaturations P: Change Lasix to p.o. 40 mg twice daily ENT is managing the trach. Would not remove trach until he has significant weight loss. Currently on trach collar for 48 hours therefore he can be  moved to a stepdown unit Continue to mobilize as tolerated    MRSA, Pseudomonas tracheobronchitis.  Currently day 5 of 7 of cefepime MRSA was considered a colonizer     Mucus plugging/ blood clotting (had trach-occlusive blood clot 1/9.) Continue pulmonary toilet May need suctioning with Bivona air trach      Morbid obesity Continue p.o. diet  Muscular deconditioning Able to ambulate with PT assistance. Continue reconditioning  Uncontrolled hypertension Hyperlipidemia Continue antihypertensives at current rate  Acute metabolic encephalopathy Resolved Decrease Seroquel to 25 mg nightly with goal of discontinuing in the near future.  Best practice (evaluated daily)  Diet: regular diet when off vent, continue TF with cortrak Pain/Anxiety/Delirium protocol (if indicated): minimize VAP protocol (if indicated): yes DVT prophylaxis: lovenox on hold GI prophylaxis: d/c Glucose control: monitor Mobility: up with assist Disposition: 03/06/2020 plan to transfer to stepdown unit  Goals of Care:  Last date of multidisciplinary goals of care discussion: 12/22 Family and staff present: mother Summary of discussion: full scope of practice Follow up goals of care discussion:  Mother [name] updated 1/14 Code Status: full 03/06/2020 patient updated at bedside,   Richardson Landry Shayn Madole ACNP Acute Care Nurse Practitioner [place] Please consult [place] 03/06/2020, 7:42 AM

## 2020-03-07 ENCOUNTER — Encounter (HOSPITAL_COMMUNITY): Payer: Self-pay | Admitting: Student

## 2020-03-07 ENCOUNTER — Inpatient Hospital Stay (HOSPITAL_COMMUNITY): Payer: Medicaid Other

## 2020-03-07 DIAGNOSIS — J9622 Acute and chronic respiratory failure with hypercapnia: Secondary | ICD-10-CM | POA: Diagnosis not present

## 2020-03-07 DIAGNOSIS — J40 Bronchitis, not specified as acute or chronic: Secondary | ICD-10-CM | POA: Diagnosis not present

## 2020-03-07 DIAGNOSIS — J9621 Acute and chronic respiratory failure with hypoxia: Secondary | ICD-10-CM | POA: Diagnosis not present

## 2020-03-07 LAB — GLUCOSE, CAPILLARY
Glucose-Capillary: 119 mg/dL — ABNORMAL HIGH (ref 70–99)
Glucose-Capillary: 109 mg/dL — ABNORMAL HIGH (ref 70–99)
Glucose-Capillary: 119 mg/dL — ABNORMAL HIGH (ref 70–99)
Glucose-Capillary: 109 mg/dL — ABNORMAL HIGH (ref 70–99)

## 2020-03-07 LAB — MAGNESIUM: Magnesium: 1.9 mg/dL (ref 1.7–2.4)

## 2020-03-07 LAB — PHOSPHORUS: Phosphorus: 4.7 mg/dL — ABNORMAL HIGH (ref 2.5–4.6)

## 2020-03-07 NOTE — Progress Notes (Signed)
NAME:  Shawn Ramsey, MRN:  [id number], DOB:  Jan 30, 1981, LOS: 47 ADMISSION DATE:  02/09/2020, CONSULTATION DATE:  12/24 REFERRING MD:  Denton Brick, CHIEF COMPLAINT:  Drowsiness/dyspnea   Brief History:  40 y/o male with OHS/OSA and asthma intubated for acute on chronic hypercapnic respiratory failure.  Failed NIMV in ED, some oropharyngeal trauma on intubation attempt.  Past Medical History:  Asthma OHS/OSA on home O2 3L not yet adherent to BiPAP, followed in Dr[facility] clinic Diabetes HFpEF  Significant Hospital Events:  12/19 intubated  12/19 admitted 1/3 > Desaturation and near arrest.  Unable to bag through trach initially so code called for possible intubation from above.  Bagging then became easier, ? Plugging.  Had bronch performed but no plugs seen, only tracheomalacia.  1/4 > elevated peak pressures and distress, bedside bronch performed and trach noted to be up against posterior wall of pharynx.  Trach advanced and repositioned with improvement in peak pressures and pt comfort. 1/5 > ongoing bleeding from trach.  Trach wound looks a bit worse.  Calling ENT back to please re-evaluate-trach changed to Bivona adjustable flange  8.0 mm inner diameter 130 mm length 1/7 Unable to tolerate PS, worsening infiltrates on CXR 1/12  plan for t collar 1/13 24 hrs trach tolerance 03/06/2020  Consults:  ENT  Procedures:  12/19 ETT > 12/23 12/23 Trach >  1/7 Bronchoscopy to identify trach positioning  Significant Diagnostic Tests:  02/09/20 CXR with RUL infiltrate vs atelectasis new post-intubation relative to pre-intubation, retrocardiac opacity  1/7 CXR>>Increased bilateral confluent peripheral predominant heterogeneous opacities. Differential considerations include infection or confluent pulmonary edema/hemorrhage.  Micro Data:  MRSA screen 12/20 > positive Sputum 12/26>>  MRSA 12/20 blood > ngtd Blood 12/26 > staph caprae in 1/4 bottles 1/7 Respiratory culture > MRSA presumed  colinizer, Pseudomonas ss cefepime  Antimicrobials:  Ceftriaxone 12/19 > 12/21.  Azithromycin 12/19 > 12/21. Zosyn 12/25 > 12/26. Linezolid 12/27 > 1/2. Cefepime 1/9 > (plan to DC 1/16) Vancomycin 1/9 >off  Interim History / Subjective:   States that he had difficulty with coughing last night.  He is able to get his secretions all the way up to his mouth and suctions them himself.  Has not required any deep suctioning in the last day Remains on ATC (day 3)  . Objective   Blood pressure (!) 141/75, pulse 87, temperature 98.1 F (36.7 C), temperature source Oral, resp. rate (!) 21, height 5\' 5"  (1.651 m), weight (!) 157.3 kg, SpO2 95 %.    FiO2 (%):  [40 %] 40 %   Intake/Output Summary (Last 24 hours) at 03/07/2020 1037 Last data filed at 03/07/2020 0500 Gross per 24 hour  Intake 340.14 ml  Output 900 ml  Net -559.86 ml   Filed Weights   03/06/20 0353 03/06/20 2227 03/07/20 0500  Weight: (!) 158.9 kg (!) 157.9 kg (!) 157.3 kg   Examination: General: Obese man, awake, up to chair HEENT: #8 Bivona trach is in place, slight extravasation, at 11 cm at the stoma > advanced to appropriate position Neuro: Awake, alert, interacting appropriately, moves all extremities, follows commands CV: Regular, distant, no murmur PULM: Decreased bilateral breath sounds without any crackles or wheezing GI: Massively obese, soft, nondistended Extremities: 2+ lower extremity edema Skin: No rash    Intake/Output Summary (Last 24 hours) at 03/07/2020 1037 Last data filed at 03/07/2020 0500 Gross per 24 hour  Intake 340.14 ml  Output 900 ml  Net -559.86 ml    Assessment &  Plan:   Acute on chronic hypoxemic respiratory failure  In setting of obesity hypoventilation syndrome and asthma with hx of difficult airway and tracheomalacia - s/p trach by ENT and replaced with 65mm Bivona 1/5 - has experienced trach extravasation leading to occlusion with coughing fits and oxygen  desaturations P: -Bivona trach in place, needs to be snug in position at 14 cm, follow closely -ENT managing trach -PT/OT -His overall goal is to be decannulated at some point.  I explained to him that he would have to be compliant with BiPAP in order to accomplish this. -Continue PMV as he can tolerate  MRSA, Pseudomonas tracheobronchitis.  -Completed treatment for MRSA, residual on respiratory culture felt to be colonizer -Completing cefepime day 6 of 7 on 1/15 for Pseudomonas  Asthma -brovana, yulpelri as ordered -? May need ICS going forward  Morbid obesity -P.o. diet -Discussed with him the benefits of weight loss particularly with regard to his respiratory failure.  He knows that this is a long-range goal  Muscular deconditioning -Continue to work with PT/OT  Uncontrolled hypertension HFpEF Hyperlipidemia -Continue current antihypertensives -Lasix 40 mg p.o. twice daily  Acute metabolic encephalopathy, resolved -Seroquel decreased to 25 mg nightly on 1/14, plan to stop on 1/16   Best practice (evaluated daily)  Diet: regular diet  Pain/Anxiety/Delirium protocol (if indicated): weaning seroquel off VAP protocol (if indicated): yes DVT prophylaxis: enoxaparin GI prophylaxis: d/c Glucose control: monitor Mobility: up with assist Disposition: Transfer to progressive/ SDU ordered 1/14  Will ask TRH to assume his care as of 1/16. PCCM will follow for his trach     Goals of Care:  Last date of multidisciplinary goals of care discussion: 12/22 Family and staff present: mother Summary of discussion: full scope of practice Follow up goals of care discussion:  Mother [name] updated 1/14 Code Status: full  03/07/2020 patient updated at bedside   Baltazar Apo, MD, PhD 03/07/2020, 10:56 AM [facility] [phone] or if no answer [phone]

## 2020-03-08 DIAGNOSIS — J9621 Acute and chronic respiratory failure with hypoxia: Secondary | ICD-10-CM | POA: Diagnosis not present

## 2020-03-08 DIAGNOSIS — J9622 Acute and chronic respiratory failure with hypercapnia: Secondary | ICD-10-CM | POA: Diagnosis not present

## 2020-03-08 LAB — GLUCOSE, CAPILLARY
Glucose-Capillary: 90 mg/dL (ref 70–99)
Glucose-Capillary: 101 mg/dL — ABNORMAL HIGH (ref 70–99)
Glucose-Capillary: 109 mg/dL — ABNORMAL HIGH (ref 70–99)
Glucose-Capillary: 101 mg/dL — ABNORMAL HIGH (ref 70–99)
Glucose-Capillary: 99 mg/dL (ref 70–99)

## 2020-03-08 NOTE — Progress Notes (Signed)
   PCCM Interval note  Called to bedside to evaluate trach as patient reports it was making noises. No SOB.  RT had reported they had suctioned however didn't feel like they were able to pass suction catheter.    brovanna trach at 14cm. Strong cough, able to clear secretions.  No distress.  Phonating clearing Trach suctioned and able to pass suction catheter without difficulty.  Will continue to monitor.    Kennieth Rad, ACNP [facility] 03/08/2020, 1:22 PM

## 2020-03-08 NOTE — Progress Notes (Signed)
Resp Therapist asked that an ENT consult be placed for the pts trach, stated she felt like trach was "clogged": Pt felt the same . MD paged. MD stated she paged PCCM.

## 2020-03-08 NOTE — Progress Notes (Signed)
PROGRESS NOTE    Shawn Ramsey  BJS:[id number]  DOB: 1981/01/25  DOA: 02/09/2020 PCP: Fayrene Helper, MD Outpatient Specialists:   Hospital course:  40 year old obese man with asthma and COPD has chronic respiratory failure with hypercapnia was admitted 02/09/2020 with acute on chronic respiratory failure with hypercapnia.  Patient is admitted to [facility] and was intubated.  Patient underwent a tracheostomy 02/13/2020.  Care of the tracheostomy has been complicated requiring ongoing ENT and PCCM involvement.  Patient is transferred out of the ICU to hospitalist care on 03/08/2020.  12/19 intubated  12/19 admitted 1/3 > Desaturation and near arrest.  Unable to bag through trach initially so code called for possible intubation from above.  Bagging then became easier, ? Plugging.  Had bronch performed but no plugs seen, only tracheomalacia.  1/4 > elevated peak pressures and distress, bedside bronch performed and trach noted to be up against posterior wall of pharynx.  Trach advanced and repositioned with improvement in peak pressures and pt comfort. 1/5 > ongoing bleeding from trach.  Trach wound looks a bit worse.  Calling ENT back to please re-evaluate-trach changed to Bivona adjustable flange  8.0 mm inner diameter 130 mm length 1/7 Unable to tolerate PS, worsening infiltrates on CXR 1/12  plan for t collar 1/13 24 hrs trach tolerance 03/06/2020    Subjective:  Patient's main concern today is wheezing from his trach that started last night.  Notes they have tried to suction him but it does not feel like its been effective.  No shortness of breath but the wheezing sound with both inspiration and expiration is bothering him and he was unable to sleep last night.   Objective: Vitals:   03/08/20 0830 03/08/20 0837 03/08/20 1153 03/08/20 1155  BP:    (!) 125/51  Pulse:  85 83 89  Resp:  16 14 17   Temp:      TempSrc:      SpO2: 100% 100% 93% 95%  Weight:      Height:         Intake/Output Summary (Last 24 hours) at 03/08/2020 1433 Last data filed at 03/08/2020 0400 Gross per 24 hour  Intake 210 ml  Output --  Net 210 ml   Filed Weights   03/06/20 2227 03/07/20 0500 03/08/20 0621  Weight: (!) 157.9 kg (!) 157.3 kg (!) 161.6 kg     Exam:  General: Shawn Ramsey obese man looking older than stated age sitting up on side of bed with trach in place.  Audible inspiratory and expiratory wheezing via trach noted. Eyes: sclera anicteric, conjuctiva mild injection bilaterally CVS: Distant heart sounds, difficult to hear over transmitted upper respiratory sounds. Respiratory: Transmitted wheezes and coarse breath sounds throughout all lung fields GI: Extremely obese.  Nontender.  Neuro:  grossly nonfocal.  Psych: patient is logical and coherent, judgement and insight appear normal, mood and affect appropriate to situation.   Assessment & Plan:   Acute on chronic hypoxic respiratory failure Secondary to obesity hypoventilation syndrome and asthma Patient has known tracheomalacia and had difficulty with intubation And is post trach by ENT and replaced with 8 mm Bivona 02/26/2020 ENT is managing trach--apparently needs to be" a snug position at 14 cm".  MRSA/Pseudomonas tracheobronchitis Needed treatment for MRSA Residual MRSA on respiratory culture felt to be colonizer Patient complete 7-day course of cefepime and for Pseudomonas today.  Asthma Continue inhaled bronchodilators as ordered  Morbid obesity Weight lost has been discussed with him in the  past  HTN Continue present antihypertensive  Acute metabolic encephalopathy Resolved, Seroquel has been discontinued  HFpEF Lasix 40 mg p.o. twice daily   DVT prophylaxis: Lovenox Code Status: Full Family Communication: None Disposition Plan:   Patient is from: Home  Anticipated Discharge Location: ABD  Barriers to Discharge: Unstable trach  Is patient medically stable for Discharge:  No   Consultants:  PCCM  ENT  Procedures: 12/19 ETT > 12/23 12/23 Trach >  1/7 Bronchoscopy to identify trach positioning  Antimicrobials: Ceftriaxone 12/19 > 12/21.  Azithromycin 12/19 > 12/21. Zosyn 12/25 > 12/26. Linezolid 12/27 > 1/2. Cefepime 1/9 > (plan to DC 1/16) Vancomycin 1/9 >off   Data Reviewed:  Basic Metabolic Panel: Recent Labs  Lab 03/02/20 0115 03/03/20 0201 03/04/20 0200 03/05/20 0126 03/06/20 0012 03/07/20 0123  NA 144 143 138 137 137  --   K 3.6 3.3* 3.5 3.8 4.1  --   CL 97* 98 95* 95* 97*  --   CO2 35* 35* 30 30 28   --   GLUCOSE 110* 126* 135* 110* 97  --   BUN 14 23* 19 15 16   --   CREATININE 0.64 0.80 0.66 0.73 0.65  --   CALCIUM 9.3 9.5 9.0 9.3 9.4  --   MG  --  1.9  --  1.8  --  1.9  PHOS  --  5.1*  --  4.9*  --  4.7*   Liver Function Tests: No results for input(s): AST, ALT, ALKPHOS, BILITOT, PROT, ALBUMIN in the last 168 hours. No results for input(s): LIPASE, AMYLASE in the last 168 hours. No results for input(s): AMMONIA in the last 168 hours. CBC: Recent Labs  Lab 03/02/20 0115 03/03/20 0201 03/04/20 0200 03/05/20 0126 03/06/20 0012  WBC 11.7* 12.1* 9.7 10.3 9.6  HGB 9.9* 9.7* 9.5* 10.0* 9.8*  HCT 31.6* 31.1* 29.7* 30.6* 32.2*  MCV 90.3 88.9 87.6 87.2 89.4  PLT 449* 427* 396 419* 428*   Cardiac Enzymes: No results for input(s): CKTOTAL, CKMB, CKMBINDEX, TROPONINI in the last 168 hours. BNP (last 3 results) No results for input(s): PROBNP in the last 8760 hours. CBG: Recent Labs  Lab 03/07/20 2038 03/07/20 2321 03/08/20 0403 03/08/20 0756 03/08/20 1153  GLUCAP 119* 109* 90 101* 109*    Recent Results (from the past 240 hour(s))  Culture, respiratory (non-expectorated)     Status: None   Collection Time: 02/28/20 11:50 AM   Specimen: Tracheal Aspirate; Respiratory  Result Value Ref Range Status   Specimen Description TRACHEAL ASPIRATE  Final   Special Requests NONE  Final   Gram Stain   Final    NO WBC  SEEN RARE GRAM POSITIVE COCCI IN PAIRS Performed at [facility], [address], [place], [place] [address]    Culture   Final    FEW METHICILLIN RESISTANT STAPHYLOCOCCUS AUREUS RARE PSEUDOMONAS SPECIES    Report Status 03/01/2020 FINAL  Final   Organism ID, Bacteria METHICILLIN RESISTANT STAPHYLOCOCCUS AUREUS  Final   Organism ID, Bacteria PSEUDOMONAS SPECIES  Final      Susceptibility   Methicillin resistant staphylococcus aureus - MIC*    CIPROFLOXACIN >=8 RESISTANT Resistant     ERYTHROMYCIN >=8 RESISTANT Resistant     GENTAMICIN <=0.5 SENSITIVE Sensitive     OXACILLIN >=4 RESISTANT Resistant     TETRACYCLINE <=1 SENSITIVE Sensitive     VANCOMYCIN 1 SENSITIVE Sensitive     TRIMETH/SULFA >=320 RESISTANT Resistant     CLINDAMYCIN >=8 RESISTANT  Resistant     RIFAMPIN <=0.5 SENSITIVE Sensitive     Inducible Clindamycin NEGATIVE Sensitive     * FEW METHICILLIN RESISTANT STAPHYLOCOCCUS AUREUS   Pseudomonas species - MIC*    CEFTAZIDIME 2 SENSITIVE Sensitive     CIPROFLOXACIN <=0.25 SENSITIVE Sensitive     GENTAMICIN <=1 SENSITIVE Sensitive     IMIPENEM 1 SENSITIVE Sensitive     PIP/TAZO <=4 SENSITIVE Sensitive     CEFEPIME 2 SENSITIVE Sensitive     * RARE PSEUDOMONAS SPECIES      Studies: DG Chest Port 1 View  Result Date: 03/07/2020 CLINICAL DATA:  Respiration abnormal. EXAM: PORTABLE CHEST 1 VIEW COMPARISON:  03/05/2020 FINDINGS: Again noted is a tracheostomy tube. Difficult evaluate the lungs due to overlying soft tissue but there may be improved aeration in the upper lungs. Overall, lung volumes have not significantly changed. Patient appears to be rotated towards the right. Feeding tube is not clearly identified on this examination. Residual patchy densities in lower lungs are nonspecific. IMPRESSION: Limited evaluation of the lungs but there appears to be slightly improved aeration compared to the most recent comparison examination. Hazy and patchy densities in lower  lungs are nonspecific but could be associated with atelectasis. Electronically Signed   By: Markus Daft M.D.   On: 03/07/2020 08:00     Scheduled Meds: . arformoterol  15 mcg Nebulization BID  . atorvastatin  40 mg Oral Daily  . carvedilol  6.25 mg Oral BID WC  . chlorhexidine  15 mL Mouth Rinse BID  . Chlorhexidine Gluconate Cloth  6 each Topical Daily  . docusate sodium  100 mg Oral BID  . enoxaparin (LOVENOX) injection  80 mg Subcutaneous Q24H  . furosemide  40 mg Oral BID  . loratadine  10 mg Oral Daily  . mouth rinse  15 mL Mouth Rinse q12n4p  . multivitamin with minerals  1 tablet Oral Daily  . nystatin  5 mL Mouth/Throat QID  . pantoprazole  40 mg Oral Daily  . polyethylene glycol  17 g Oral Daily  . potassium chloride  20 mEq Oral Daily  . Ensure Max Protein  11 oz Oral BID  . revefenacin  175 mcg Nebulization Daily  . senna  2 tablet Oral BID  . sodium chloride flush  10-40 mL Intracatheter Q12H   Continuous Infusions: . sodium chloride Stopped (03/06/20 1033)  . ceFEPime (MAXIPIME) IV 2 g (03/08/20 0855)    Active Problems:   Tracheobronchitis   Acute on chronic respiratory failure (HCC)   Acute respiratory failure with hypercapnia (HCC)   Tracheostomy in place [facility])   Encephalopathy acute   Tracheomalacia   Endotracheally intubated   Copious oral secretions   Pressure injury of skin   Pseudomonas aeruginosa infection     Shawn Ramsey Derek Jack, [facility]  If 7PM-7AM, please contact night-coverage [email or web address] Password [email or web address] 03/08/2020, 2:33 PM    LOS: 28 days

## 2020-03-09 DIAGNOSIS — J9622 Acute and chronic respiratory failure with hypercapnia: Secondary | ICD-10-CM | POA: Diagnosis not present

## 2020-03-09 DIAGNOSIS — J9621 Acute and chronic respiratory failure with hypoxia: Secondary | ICD-10-CM | POA: Diagnosis not present

## 2020-03-09 DIAGNOSIS — Z930 Tracheostomy status: Secondary | ICD-10-CM | POA: Diagnosis not present

## 2020-03-09 LAB — GLUCOSE, CAPILLARY
Glucose-Capillary: 100 mg/dL — ABNORMAL HIGH (ref 70–99)
Glucose-Capillary: 102 mg/dL — ABNORMAL HIGH (ref 70–99)
Glucose-Capillary: 86 mg/dL (ref 70–99)
Glucose-Capillary: 91 mg/dL (ref 70–99)
Glucose-Capillary: 91 mg/dL (ref 70–99)
Glucose-Capillary: 91 mg/dL (ref 70–99)

## 2020-03-09 MED ORDER — ALPRAZOLAM 0.25 MG PO TABS
0.2500 mg | ORAL_TABLET | Freq: Every day | ORAL | Status: AC | PRN
  Administered 2020-03-09 – 2020-03-11 (×3): 0.25 mg via ORAL
  Filled 2020-03-09 (×4): qty 1

## 2020-03-09 MED ORDER — SALINE SPRAY 0.65 % NA SOLN
1.0000 | NASAL | Status: DC | PRN
  Filled 2020-03-09: qty 44

## 2020-03-09 MED ORDER — FUROSEMIDE 40 MG PO TABS
40.0000 mg | ORAL_TABLET | Freq: Every day | ORAL | Status: DC
  Administered 2020-03-10 – 2020-03-13 (×4): 40 mg via ORAL
  Filled 2020-03-09 (×4): qty 1

## 2020-03-09 NOTE — Progress Notes (Signed)
[facility] PCCM  Came by for rounds Was on bedside commode, then on my second visit he was speaking with [place] attending while sitting on side of bed, breathing comfortably, speaking in full sentences Continue current management Will follow  Roselie Awkward, MD [place] PCCM Pager: [phone] Cell: [phone] If no response, call [phone]

## 2020-03-09 NOTE — Progress Notes (Signed)
Patient prefers to sit at bedside despite fall risk. He is more comfortable in this position. The recliner is also an option. At this time the patient has remained at the side of bed. He follows commands and no impulsive behavior has been noted.

## 2020-03-09 NOTE — Progress Notes (Signed)
Received patient sitting on edge of bed. Patient appears tired and unsteady. Encouraged patient to get fully into bed to which he declined. Encouraged patient to scoot farther back towards center of bed to which he was agreeable, but still remains swaying back and forth. Patient instructed not to get out of bed without assistance and callbell placed within patient's reach.

## 2020-03-09 NOTE — Plan of Care (Signed)
  Problem: Pain Managment: Goal: General experience of comfort will improve Outcome: Progressing   Problem: Activity: Goal: Ability to tolerate increased activity will improve Outcome: Progressing   Problem: Respiratory: Goal: Ability to maintain a clear airway and adequate ventilation will improve Outcome: Progressing   Problem: Role Relationship: Goal: Method of communication will improve Outcome: Progressing

## 2020-03-09 NOTE — Progress Notes (Signed)
  Speech Language Pathology Treatment: Nada Boozer Speaking valve  Patient Details Name: Shawn Ramsey MRN: [id number] DOB: [birth year] Today's Date: 03/09/2020 Time: 2025-4270 SLP Time Calculation (min) (ACUTE ONLY): 20 min  Assessment / Plan / Recommendation Clinical Impression  Pt was seen for PMSV treatment. He was alert and cooperative during the session. Kathlee Nations, RT was present at the onset of the session and provided tracheal lavage and suctioning due to frequent coughing raising concern for mucous plug. RN reported that the pt has not wanted to wear the PMSV today. Pt stated that this was due to his feeling SOB when it is in place. Pt's RN stated that his vitals have been stable while it is on and questions the impact of anxiety on his tolerance of the PMSV. Pt was reminded that he was tolerating the PMSV for hours last week and tolerated it for >58minutes during the last session with this SLP. Per the pt, he only started feeling dyspneic with the PMSV for the last few days. Pt's cuff was deflated upon SLP's entry. Pt tolerated the PMSV for ~10 minutes total with pt independently donning and doffing when he felt dyspneic. Vitals ranged RR 16-21, SpO2 89-97, HR 91-96 while it was in place. Pt's vocal quality was WNL and, as expected, breath support for speech was notably improved with PMSV in place. Pt was educated regarding the benefits of consistently using the PMSV; he verbalized understanding and agreed to attempt using it more frequently. The session was terminated to allow pt to receive Xanax from RN. SLP will continue to follow pt.    HPI HPI: Pt is a 40 y.o. male with PMH morbid obesity and sleep apnea. Pt was admitted with respiratory distress. Pt intubated on admission on 62/37 with complication of some posterior oropharyngeal trauma with bleeding noted there during anesthesia's attempt. Trach placed by ENT on 12/23. CXR 12/19: CXR with RUL infiltrate vs atelectasis new post-intubation  relative to pre-intubation, retrocardiac opacity.      SLP Plan  Continue with current plan of care       Recommendations  Diet recommendations: Regular;Thin liquid Liquids provided via: Cup;Straw Medication Administration: Whole meds with liquid Supervision: Patient able to self feed Compensations: Slow rate;Small sips/bites Postural Changes and/or Swallow Maneuvers: Seated upright 90 degrees      Patient may use Passy-Muir Speech Valve: During PO intake/meals;During all waking hours (remove during sleep);During all therapies with supervision PMSV Supervision: Intermittent MD: Please consider changing trach tube to : Smaller size         Oral Care Recommendations: Oral care BID Follow up Recommendations: 24 hour supervision/assistance SLP Visit Diagnosis: Aphonia (R49.1) Plan: Continue with current plan of care       Illya Gienger I. Hardin Negus, [place], [place] Office number [phone] Pager [place] 03/09/2020, 1:29 PM

## 2020-03-09 NOTE — Progress Notes (Signed)
   03/09/20 0830  Vitals  BP 95/65  MAP (mmHg) 74   Attending physician made aware of above BP. Instructed to hold morning dose of coreg and lasix.

## 2020-03-09 NOTE — Plan of Care (Signed)
  Problem: Education: Goal: Knowledge of General Education information will improve Description: Including pain rating scale, medication(s)/side effects and non-pharmacologic comfort measures Outcome: Progressing   Problem: Health Behavior/Discharge Planning: Goal: Ability to manage health-related needs will improve Outcome: Progressing   Problem: Clinical Measurements: Goal: Respiratory complications will improve Outcome: Progressing   

## 2020-03-09 NOTE — Consult Note (Addendum)
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 Patient was sitting up in the bed. Stated Respiratory had come last night and changed out the gauze. Surrounding area under the trach plate was pink but difficult to see on the right and the left. Two areas of crusting noted left clavicle region that are healing nicely. Wound bed: areas are clean, pink, the larger area is moist but the drainage seems to be well controlled if dressings are changed regularly. Drawtex dressings currently not in the room just split gauze.  Drainage (amount, consistency, odor) non purulent. Not significant amounts  Periwound: intact  Dressing procedure/placement/frequency: Continue hydro conductive dressing (Drawtex) drain dressing Kellie Simmering # [id number]). Change daily and PRN soilage.  Monitor the wound area(s) for worsening of condition such as: Signs/symptoms of infection, increase in size, development of or worsening of odor, development of pain, or increased pain at the affected locations.   Notify the medical team if any of these develop.  Thank you for the consult. [place] nurse will not follow at this time.   Please re-consult the [facility] team if needed.  Cathlean Marseilles Tamala Julian, MSN, RN, [place], [place] Pearl, [facility] Wound Treatment Associate Pager [phone]

## 2020-03-09 NOTE — TOC Progression Note (Signed)
Transition of Care [facility]) - Progression Note    Patient Details  Name: Shawn Ramsey MRN: [id number] Date of Birth: 1980/10/21  Transition [facility] CM/SW Contact  Joanne Chars, LCSW Phone Number: 03/09/2020, 2:54 PM  Clinical Narrative:  CSW met with pt and pt called mother Shawn Ramsey who participated by phone.  Plan remains for pt to return to mother's home at [facility]. [facility] is pt current O2 provider (confirmed with Freda Munro at [place])  PCP in place.  Pt currently has walker and cane at home, along with the O2 equipment and a nebulizer.       Expected Discharge Plan: [place] Barriers to Discharge: Continued Medical Work up  Expected Discharge Plan and Services Expected Discharge Plan: [place]                                               Social Determinants of Health (SDOH) Interventions    Readmission Risk Interventions No flowsheet data found.

## 2020-03-09 NOTE — Progress Notes (Addendum)
Progress Note    Shawn Ramsey  ZJQ:[id number] DOB: 1980-11-24  DOA: 02/09/2020 PCP: Fayrene Helper, MD      Brief Narrative:    Medical records reviewed and are as summarized below:  Shawn Ramsey is a 40 y.o. male with asthma and COPD has chronic respiratory failure with hypercapnia was admitted 02/09/2020 with acute on chronic respiratory failure with hypercapnia.  Patient is admitted to [facility] and was intubated.  Patient underwent a tracheostomy 02/13/2020.  Care of the tracheostomy has been complicated requiring ongoing ENT and PCCM involvement.  Patient is transferred out of the ICU to hospitalist care on 03/08/2020.  12/19 intubated  12/19 admitted 1/3 >Desaturation and near arrest. Unable to bag through trach initially so code called for possible intubation from above. Bagging then became easier, ? Plugging. Had bronch performed but no plugs seen, only tracheomalacia.  1/4 >elevated peak pressures and distress, bedside bronch performed and trach noted to be up against posterior wall of pharynx. Trach advanced and repositioned with improvement in peak pressures and pt comfort. 1/5 >ongoing bleeding from trach. Trach wound looks a bit worse. Calling ENT back to please re-evaluate-trach changed to Bivona adjustable flange 8.0 mm inner diameter 130 mm length 1/7 Unable to tolerate PS, worsening infiltrates on CXR 1/12 plan for t collar 1/13 24 hrs trach tolerance 03/06/2020       Assessment/Plan:   Active Problems:   Tracheobronchitis   Acute on chronic respiratory failure (HCC)   Acute respiratory failure with hypercapnia (HCC)   Tracheostomy in place [facility])   Encephalopathy acute   Tracheomalacia   Endotracheally intubated   Copious oral secretions   Pressure injury of skin   Pseudomonas aeruginosa infection   Nutrition Problem: Inadequate oral intake Etiology: inability to eat  Signs/Symptoms: NPO status   Body mass index is 59.29  kg/m.  Morbid obesity)    Acute on chronic hypoxic respiratory failure Secondary to obesity hypoventilation syndrome and asthma Patient has known tracheomalacia and had difficulty with intubation And is post trach by ENT and replaced with 8 mm Bivona 02/26/2020 ENT is managing trach--apparently needs to be" a snug position at 14 cm". He is on 25% oxygen at 8 L/min.  Continue to taper down oxygen as able.  MRSA/Pseudomonas tracheobronchitis Needed treatment for MRSA Residual MRSA on respiratory culture felt to be colonizer He completed a 7-day course of cefepime for Pseudomonas.   Asthma Continue bronchodilators as needed.  Morbid obesity Weight lost has been discussed with him in the past  HTN Blood pressure was low today so Lasix and Coreg were held in the morning.  Monitor BP closely.  Acute metabolic encephalopathy Resolved, Seroquel has been discontinued  HFpEF Decrease Lasix to 40 mg daily (from twice a day)  Anxiety Low-dose Xanax as needed.  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 Continue local wound care.  Appreciate wound care recommendations.    Case was discussed with intensivist, Dr. Simonne Maffucci. Plan of care was also discussed with his mother.   Diet Order            Diet regular Room service appropriate? Yes; Fluid consistency: Thin  Diet effective now                    Consultants:  ENT  Intensivist  Procedures:  Intubation and extubation  Tracheostomy on 02/13/2020    Medications:   . arformoterol  15 mcg Nebulization BID  . atorvastatin  40 mg Oral Daily  . carvedilol  6.25 mg Oral BID WC  . chlorhexidine  15 mL Mouth Rinse BID  . Chlorhexidine Gluconate Cloth  6 each Topical Daily  . docusate sodium  100 mg Oral BID  . enoxaparin (LOVENOX) injection  80 mg Subcutaneous Q24H  . [START ON 03/10/2020] furosemide  40 mg Oral Daily  . loratadine  10 mg Oral Daily  . mouth rinse  15 mL Mouth Rinse q12n4p  .  multivitamin with minerals  1 tablet Oral Daily  . nystatin  5 mL Mouth/Throat QID  . pantoprazole  40 mg Oral Daily  . polyethylene glycol  17 g Oral Daily  . potassium chloride  20 mEq Oral Daily  . Ensure Max Protein  11 oz Oral BID  . revefenacin  175 mcg Nebulization Daily  . senna  2 tablet Oral BID  . sodium chloride flush  10-40 mL Intracatheter Q12H   Continuous Infusions: . sodium chloride Stopped (03/06/20 1033)     Anti-infectives (From admission, onward)   Start     Dose/Rate Route Frequency Ordered Stop   03/01/20 1800  ceFEPIme (MAXIPIME) 2 g in sodium chloride 0.9 % 100 mL IVPB        2 g 200 mL/hr over 30 Minutes Intravenous Every 8 hours 03/01/20 1641 03/08/20 1759   02/17/20 1345  linezolid (ZYVOX) IVPB 600 mg        600 mg 300 mL/hr over 60 Minutes Intravenous Every 12 hours 02/17/20 1248 02/23/20 2240   02/16/20 1200  piperacillin-tazobactam (ZOSYN) IVPB 3.375 g  Status:  Discontinued        3.375 g 12.5 mL/hr over 240 Minutes Intravenous Every 8 hours 02/16/20 0301 02/17/20 1248   02/16/20 0330  piperacillin-tazobactam (ZOSYN) IVPB 3.375 g        3.375 g 100 mL/hr over 30 Minutes Intravenous STAT 02/16/20 0301 02/16/20 0505   02/10/20 2000  azithromycin (ZITHROMAX) 500 mg in sodium chloride 0.9 % 250 mL IVPB  Status:  Discontinued        500 mg 250 mL/hr over 60 Minutes Intravenous Every 24 hours 02/10/20 0111 02/11/20 0800   02/09/20 2045  cefTRIAXone (ROCEPHIN) 2 g in sodium chloride 0.9 % 100 mL IVPB  Status:  Discontinued        2 g 200 mL/hr over 30 Minutes Intravenous Every 24 hours 02/09/20 2019 02/11/20 0800   02/09/20 2045  azithromycin (ZITHROMAX) 500 mg in sodium chloride 0.9 % 250 mL IVPB  Status:  Discontinued        500 mg 250 mL/hr over 60 Minutes Intravenous Every 24 hours 02/09/20 2019 02/10/20 0018             Family Communication/Anticipated D/C date and plan/Code Status   DVT prophylaxis:      Code Status: Full  Code  Family Communication: Plan discussed with his mom over the phone Disposition Plan:    Status is: Inpatient  Remains inpatient appropriate because:Hypoxia requiring trach   Dispo: The patient is from: Home              Anticipated d/c is to: SNF              Anticipated d/c date is: > 3 days              Patient currently is not medically stable to d/c.           Subjective:   C/o nasal congestion and  trouble breathing.  He has also been feeling anxious.  Objective:    Vitals:   03/09/20 0345 03/09/20 0403 03/09/20 0729 03/09/20 0830  BP: 140/89   95/65  Pulse: 86 80 74   Resp: 20 (!) 21 (!) 24 20  Temp: 98 F (36.7 C)   98.3 F (36.8 C)  TempSrc:    Oral  SpO2: 97% 96% 97% 92%  Weight:      Height:       No data found.   Intake/Output Summary (Last 24 hours) at 03/09/2020 1210 Last data filed at 03/08/2020 1500 Gross per 24 hour  Intake 100 ml  Output --  Net 100 ml   Filed Weights   03/06/20 2227 03/07/20 0500 03/08/20 0621  Weight: (!) 157.9 kg (!) 157.3 kg (!) 161.6 kg    Exam:  GEN: NAD SKIN: Warm and dry.  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 EYES: EOMI ENT: MMM,+ tracheostomy CV: RRR PULM: CTA B ABD: soft, grossly obese, NT, +BS CNS: AAO x 3, non focal EXT: No edema or tenderness   Data Reviewed:   I have personally reviewed following labs and imaging studies:  Labs: Labs show the following:   Basic Metabolic Panel: Recent Labs  Lab 03/03/20 0201 03/04/20 0200 03/05/20 0126 03/06/20 0012 03/07/20 0123  NA 143 138 137 137  --   K 3.3* 3.5 3.8 4.1  --   CL 98 95* 95* 97*  --   CO2 35* 30 30 28   --   GLUCOSE 126* 135* 110* 97  --   BUN 23* 19 15 16   --   CREATININE 0.80 0.66 0.73 0.65  --   CALCIUM 9.5 9.0 9.3 9.4  --   MG 1.9  --  1.8  --  1.9  PHOS 5.1*  --  4.9*  --  4.7*   GFR Estimated Creatinine Clearance: 178 mL/min (by C-G formula based on SCr of 0.65 mg/dL). Liver Function Tests: No results  for input(s): AST, ALT, ALKPHOS, BILITOT, PROT, ALBUMIN in the last 168 hours. No results for input(s): LIPASE, AMYLASE in the last 168 hours. No results for input(s): AMMONIA in the last 168 hours. Coagulation profile No results for input(s): INR, PROTIME in the last 168 hours.  CBC: Recent Labs  Lab 03/03/20 0201 03/04/20 0200 03/05/20 0126 03/06/20 0012  WBC 12.1* 9.7 10.3 9.6  HGB 9.7* 9.5* 10.0* 9.8*  HCT 31.1* 29.7* 30.6* 32.2*  MCV 88.9 87.6 87.2 89.4  PLT 427* 396 419* 428*   Cardiac Enzymes: No results for input(s): CKTOTAL, CKMB, CKMBINDEX, TROPONINI in the last 168 hours. BNP (last 3 results) No results for input(s): PROBNP in the last 8760 hours. CBG: Recent Labs  Lab 03/08/20 1606 03/08/20 2139 03/09/20 0013 03/09/20 0831 03/09/20 1204  GLUCAP 101* 99 100* 86 102*   D-Dimer: No results for input(s): DDIMER in the last 72 hours. Hgb A1c: No results for input(s): HGBA1C in the last 72 hours. Lipid Profile: No results for input(s): CHOL, HDL, LDLCALC, TRIG, CHOLHDL, LDLDIRECT in the last 72 hours. Thyroid function studies: No results for input(s): TSH, T4TOTAL, T3FREE, THYROIDAB in the last 72 hours.  Invalid input(s): FREET3 Anemia work up: No results for input(s): VITAMINB12, FOLATE, FERRITIN, TIBC, IRON, RETICCTPCT in the last 72 hours. Sepsis Labs: Recent Labs  Lab 03/03/20 0201 03/04/20 0200 03/05/20 0126 03/06/20 0012  WBC 12.1* 9.7 10.3 9.6    Microbiology No results found for this or any previous visit (  from the past 240 hour(s)).  Procedures and diagnostic studies:  No results found.             LOS: 29 days   Ju[employer] Copywriter, advertising on [email or web address]. If 7PM-7AM, please contact night-coverage at [email or web address]     03/09/2020, 12:10 PM

## 2020-03-09 NOTE — Progress Notes (Signed)
Frequent calls from the patient tonight. He is uncomfortable in every position. We have tried lying him down only for him to call within minutes to get back up due to shortness of breath. It has been suggested that he tries the recliner since it will allow him to sit upright and he is less likely to fall,. Patient declines. He is sitting at bedside swaying back and forth. Her is visibly exhausted and there is no safe plan for him at this time.

## 2020-03-10 DIAGNOSIS — J9621 Acute and chronic respiratory failure with hypoxia: Secondary | ICD-10-CM | POA: Diagnosis not present

## 2020-03-10 DIAGNOSIS — J9622 Acute and chronic respiratory failure with hypercapnia: Secondary | ICD-10-CM | POA: Diagnosis not present

## 2020-03-10 DIAGNOSIS — J80 Acute respiratory distress syndrome: Secondary | ICD-10-CM | POA: Diagnosis not present

## 2020-03-10 LAB — GLUCOSE, CAPILLARY
Glucose-Capillary: 90 mg/dL (ref 70–99)
Glucose-Capillary: 96 mg/dL (ref 70–99)

## 2020-03-10 MED ORDER — BUDESONIDE 0.25 MG/2ML IN SUSP
0.2500 mg | Freq: Two times a day (BID) | RESPIRATORY_TRACT | Status: DC
  Administered 2020-03-10 – 2020-03-13 (×6): 0.25 mg via RESPIRATORY_TRACT
  Filled 2020-03-10 (×6): qty 2

## 2020-03-10 NOTE — Plan of Care (Signed)
  Problem: Safety: Goal: Ability to remain free from injury will improve Outcome: Progressing   Problem: Respiratory: Goal: Ability to maintain a clear airway and adequate ventilation will improve Outcome: Progressing   Problem: Role Relationship: Goal: Method of communication will improve Outcome: Progressing
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
## 2020-03-10 NOTE — Progress Notes (Signed)
NAME:  GENE GLAZEBROOK, MRN:  [id number], DOB:  [birth year], LOS: 63 ADMISSION DATE:  02/09/2020, CONSULTATION DATE:  12/24 REFERRING MD:  Denton Brick CHIEF COMPLAINT:  Drowsiness/dyspnea   Brief History:  40 y/o male with OHS/OSA and asthma intubated for acute on chronic hypercapnic respiratory failure.  Failed NIMV in ED, some oropharyngeal trauma on intubation attempt.  Past Medical History:  Asthma OHS/OSA on home O2 3L not yet adherent to BiPAP, followed in Dr. Juanetta Gosling clinic Diabetes HFpEF  Significant Hospital Events:  12/19 intubated  12/19 admitted 1/3 > Desaturation and near arrest.  Unable to bag through trach initially so code called for possible intubation from above.  Bagging then became easier, ? Plugging.  Had bronch performed but no plugs seen, only tracheomalacia.  1/4 > elevated peak pressures and distress, bedside bronch performed and trach noted to be up against posterior wall of pharynx.  Trach advanced and repositioned with improvement in peak pressures and pt comfort. 1/5 > ongoing bleeding from trach.  Trach wound looks a bit worse.  Calling ENT back to please re-evaluate-trach changed to Bivona adjustable flange  8.0 mm inner diameter 130 mm length 1/7 Unable to tolerate PS, worsening infiltrates on CXR 1/12  plan for t collar 1/13 24 hrs trach tolerance   Consults:  ENT  Procedures:  12/19 ETT > 12/23 12/23 Trach >  1/7 Bronchoscopy to identify trach positioning  Significant Diagnostic Tests:  02/09/20 CXR with RUL infiltrate vs atelectasis new post-intubation relative to pre-intubation, retrocardiac opacity  1/7 CXR>>Increased bilateral confluent peripheral predominant heterogeneous opacities. Differential considerations include infection or confluent pulmonary edema/hemorrhage.  Micro Data:  MRSA screen 12/20 > positive Sputum 12/26>>  MRSA 12/20 blood > ngtd Blood 12/26 > staph caprae in 1/4 bottles 1/7 Respiratory culture > MRSA presumed  colinizer, Pseudomonas ss cefepime  Antimicrobials:  Ceftriaxone 12/19 > 12/21.  Azithromycin 12/19 > 12/21. Zosyn 12/25 > 12/26. Linezolid 12/27 > 1/2. Cefepime 1/9 > (plan to DC 1/16) Vancomycin 1/9 >off  Interim History / Subjective:  Feels well today Some thick secretions  Objective   Blood pressure (!) 143/80, pulse 93, temperature 98 F (36.7 C), resp. rate 18, height 5\' 5"  (1.651 m), weight (!) 161.6 kg, SpO2 95 %.    FiO2 (%):  [28 %-35 %] 35 %   Intake/Output Summary (Last 24 hours) at 03/10/2020 1318 Last data filed at 03/09/2020 2159 Gross per 24 hour  Intake --  Output 350 ml  Net -350 ml   Filed Weights   03/06/20 2227 03/07/20 0500 03/08/20 5573  Weight: (!) 157.9 kg (!) 157.3 kg (!) 161.6 kg    Examination: General:  Morbidly obese, resting comfortably sitting up on side of bed HENT: NCAT OP clear PULM: Rhonchi bilaterally B, normal effort CV: RRR, no mgr GI: BS+, soft, nontender MSK: normal bulk and tone Neuro: awake, alert, no distress, MAEW   Resolved Hospital Problem list     Assessment & Plan:  Acute on chronic hypoxemic respiratory failure Tracheostomy status Obesity hypoventilation syndrome Continue tracheostomy management per routine Will need follow up in ENT clinic, but would also be served well in the PCCM tracheostomy clinic and the pulmonary clinic in [place] Need to get his home settings of BIPAP (manufacturer, settings, etc) so he can move to the pulmonary clinic Continue bivona positioned at 14cm  MRSA, Pseudomonas tracheobronchitis Completed treatment for both  Monitor for fever, change in status  Asthma Change to brovana/pulmicort D/c yupelri Prn albuterol  Muscular  deconditioning PT/OT   Best practice (evaluated daily)   Per Primary  I updated mother by phone on 1/18  Goals of Care:   Per primary  Labs   CBC: Recent Labs  Lab 03/04/20 0200 03/05/20 0126 03/06/20 0012  WBC 9.7 10.3 9.6  HGB 9.5*  10.0* 9.8*  HCT 29.7* 30.6* 32.2*  MCV 87.6 87.2 89.4  PLT 396 419* 428*    Basic Metabolic Panel: Recent Labs  Lab 03/04/20 0200 03/05/20 0126 03/06/20 0012 03/07/20 0123  NA 138 137 137  --   K 3.5 3.8 4.1  --   CL 95* 95* 97*  --   CO2 30 30 28   --   GLUCOSE 135* 110* 97  --   BUN 19 15 16   --   CREATININE 0.66 0.73 0.65  --   CALCIUM 9.0 9.3 9.4  --   MG  --  1.8  --  1.9  PHOS  --  4.9*  --  4.7*   GFR: Estimated Creatinine Clearance: 178 mL/min (by C-G formula based on SCr of 0.65 mg/dL). Recent Labs  Lab 03/04/20 0200 03/05/20 0126 03/06/20 0012  WBC 9.7 10.3 9.6    Liver Function Tests: No results for input(s): AST, ALT, ALKPHOS, BILITOT, PROT, ALBUMIN in the last 168 hours. No results for input(s): LIPASE, AMYLASE in the last 168 hours. No results for input(s): AMMONIA in the last 168 hours.  ABG    Component Value Date/Time   PHART 7.331 (L) 03/01/2020 0635   PCO2ART 73.5 (HH) 03/01/2020 0635   PO2ART 101 03/01/2020 0635   HCO3 38.0 (H) 03/01/2020 0635   TCO2 36 (H) 02/16/2020 0747   ACIDBASEDEF 1.8 03/07/2014 0500   O2SAT 96.9 03/01/2020 0635     Coagulation Profile: No results for input(s): INR, PROTIME in the last 168 hours.  Cardiac Enzymes: No results for input(s): CKTOTAL, CKMB, CKMBINDEX, TROPONINI in the last 168 hours.  HbA1C: Hemoglobin A1C  Date/Time Value Ref Range Status  08/22/2019 04:15 PM 7.1 (A) 4.0 - 5.6 % Final   Hgb A1c MFr Bld  Date/Time Value Ref Range Status  01/03/2019 03:41 PM 6.7 (H) <5.7 % of total Hgb Final    Comment:    For someone without known diabetes, a hemoglobin A1c value of 6.5% or greater indicates that they may have  diabetes and this should be confirmed with a follow-up  test. . For someone with known diabetes, a value <7% indicates  that their diabetes is well controlled and a value  greater than or equal to 7% indicates suboptimal  control. A1c targets should be individualized based on   duration of diabetes, age, comorbid conditions, and  other considerations. . Currently, no consensus exists regarding use of hemoglobin A1c for diagnosis of diabetes for children. [name]   09/18/2018 09:25 AM 6.5 (H) <5.7 % of total Hgb Final    Comment:    For someone without known diabetes, a hemoglobin A1c value of 6.5% or greater indicates that they may have  diabetes and this should be confirmed with a follow-up  test. . For someone with known diabetes, a value <7% indicates  that their diabetes is well controlled and a value  greater than or equal to 7% indicates suboptimal  control. A1c targets should be individualized based on  duration of diabetes, age, comorbid conditions, and  other considerations. . Currently, no consensus exists regarding use of hemoglobin A1c for diagnosis of diabetes for children. [name]  CBG: Recent Labs  Lab 03/09/20 1641 03/09/20 2001 03/09/20 2310 03/10/20 0306 03/10/20 0736  GLUCAP 91 91 91 90 96     Critical care time: n/a     Roselie Awkward, MD [facility] Pager: [phone] Cell: [phone] If no response, call [phone]

## 2020-03-10 NOTE — Progress Notes (Signed)
Progress Note    Shawn Ramsey  [id number] DOB: 12/26/1980  DOA: 02/09/2020 PCP: Fayrene Helper, MD      Brief Narrative:    Medical records reviewed and are as summarized below:  Shawn Ramsey is a 40 y.o. male with asthma and COPD has chronic respiratory failure with hypercapnia was admitted 02/09/2020 with acute on chronic respiratory failure with hypercapnia.  Patient is admitted to [facility] and was intubated.  Patient underwent a tracheostomy 02/13/2020.  Care of the tracheostomy has been complicated requiring ongoing ENT and PCCM involvement.  Patient is transferred out of the ICU to hospitalist care on 03/08/2020.  12/19 intubated  12/19 admitted 1/3 >Desaturation and near arrest. Unable to bag through trach initially so code called for possible intubation from above. Bagging then became easier, ? Plugging. Had bronch performed but no plugs seen, only tracheomalacia.  1/4 >elevated peak pressures and distress, bedside bronch performed and trach noted to be up against posterior wall of pharynx. Trach advanced and repositioned with improvement in peak pressures and pt comfort. 1/5 >ongoing bleeding from trach. Trach wound looks a bit worse. Calling ENT back to please re-evaluate-trach changed to Bivona adjustable flange 8.0 mm inner diameter 130 mm length 1/7 Unable to tolerate PS, worsening infiltrates on CXR 1/12 plan for t collar 1/13 24 hrs trach tolerance 03/06/2020       Assessment/Plan:   Active Problems:   Tracheobronchitis   Acute on chronic respiratory failure (HCC)   Acute respiratory failure with hypercapnia (HCC)   Tracheostomy in place [facility])   Encephalopathy acute   Tracheomalacia   Endotracheally intubated   Copious oral secretions   Pressure injury of skin   Pseudomonas aeruginosa infection   Nutrition Problem: Inadequate oral intake Etiology: inability to eat  Signs/Symptoms: NPO status   Body mass index is 59.29  kg/m.  Morbid obesity)    Acute on chronic hypoxic respiratory failure Secondary to obesity hypoventilation syndrome and asthma Patient has known tracheomalacia and had difficulty with intubation And is post trach by ENT and replaced with 8 mm Bivona 02/26/2020 ENT is managing trach--apparently needs to be" a snug position at 14 cm". He is on 35% FiO2 oxygen at 8 L/min.  Continue to taper down oxygen as able.  MRSA/Pseudomonas tracheobronchitis Needed treatment for MRSA Residual MRSA on respiratory culture felt to be colonizer He completed a 7-day course of cefepime for Pseudomonas.   Asthma Continue bronchodilators as needed.  Morbid obesity Weight lost has been discussed with him in the past  HTN Continue Lasix and carvedilol  Acute metabolic encephalopathy Resolved, Seroquel has been discontinued  HFpEF Continue Lasix  Anxiety Low-dose Xanax as needed.  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 Continue local wound care.  Appreciate wound care recommendations.    Case was discussed with intensivist, Dr. Simonne Maffucci. Plan of care was also discussed with his mother.   Diet Order            Diet regular Room service appropriate? Yes; Fluid consistency: Thin  Diet effective now                    Consultants:  ENT  Intensivist  Procedures:  Intubation and extubation  Tracheostomy on 02/13/2020    Medications:   . arformoterol  15 mcg Nebulization BID  . atorvastatin  40 mg Oral Daily  . carvedilol  6.25 mg Oral BID WC  . chlorhexidine  15 mL Mouth Rinse  BID  . Chlorhexidine Gluconate Cloth  6 each Topical Daily  . docusate sodium  100 mg Oral BID  . enoxaparin (LOVENOX) injection  80 mg Subcutaneous Q24H  . furosemide  40 mg Oral Daily  . loratadine  10 mg Oral Daily  . mouth rinse  15 mL Mouth Rinse q12n4p  . multivitamin with minerals  1 tablet Oral Daily  . nystatin  5 mL Mouth/Throat QID  . pantoprazole  40 mg Oral Daily   . polyethylene glycol  17 g Oral Daily  . potassium chloride  20 mEq Oral Daily  . Ensure Max Protein  11 oz Oral BID  . revefenacin  175 mcg Nebulization Daily  . senna  2 tablet Oral BID  . sodium chloride flush  10-40 mL Intracatheter Q12H   Continuous Infusions: . sodium chloride Stopped (03/06/20 1033)     Anti-infectives (From admission, onward)   Start     Dose/Rate Route Frequency Ordered Stop   03/01/20 1800  ceFEPIme (MAXIPIME) 2 g in sodium chloride 0.9 % 100 mL IVPB        2 g 200 mL/hr over 30 Minutes Intravenous Every 8 hours 03/01/20 1641 03/08/20 1759   02/17/20 1345  linezolid (ZYVOX) IVPB 600 mg        600 mg 300 mL/hr over 60 Minutes Intravenous Every 12 hours 02/17/20 1248 02/23/20 2240   02/16/20 1200  piperacillin-tazobactam (ZOSYN) IVPB 3.375 g  Status:  Discontinued        3.375 g 12.5 mL/hr over 240 Minutes Intravenous Every 8 hours 02/16/20 0301 02/17/20 1248   02/16/20 0330  piperacillin-tazobactam (ZOSYN) IVPB 3.375 g        3.375 g 100 mL/hr over 30 Minutes Intravenous STAT 02/16/20 0301 02/16/20 0505   02/10/20 2000  azithromycin (ZITHROMAX) 500 mg in sodium chloride 0.9 % 250 mL IVPB  Status:  Discontinued        500 mg 250 mL/hr over 60 Minutes Intravenous Every 24 hours 02/10/20 0111 02/11/20 0800   02/09/20 2045  cefTRIAXone (ROCEPHIN) 2 g in sodium chloride 0.9 % 100 mL IVPB  Status:  Discontinued        2 g 200 mL/hr over 30 Minutes Intravenous Every 24 hours 02/09/20 2019 02/11/20 0800   02/09/20 2045  azithromycin (ZITHROMAX) 500 mg in sodium chloride 0.9 % 250 mL IVPB  Status:  Discontinued        500 mg 250 mL/hr over 60 Minutes Intravenous Every 24 hours 02/09/20 2019 02/10/20 0018             Family Communication/Anticipated D/C date and plan/Code Status   DVT prophylaxis:      Code Status: Full Code  Family Communication: Plan discussed with his mom over the phone Disposition Plan:    Status is: Inpatient  Remains  inpatient appropriate because:Hypoxia requiring trach   Dispo: The patient is from: Home              Anticipated d/c is to: SNF              Anticipated d/c date is: > 3 days              Patient currently is not medically stable to d/c.           Subjective:   Interval events noted.  No shortness of breath or chest pain.  He still has some congestion   Objective:    Vitals:   03/10/20 0739  03/10/20 1003 03/10/20 1159 03/10/20 1206  BP: (!) 161/96   (!) 143/80  Pulse:   93   Resp: 14  (!) 21 18  Temp: 97.7 F (36.5 C)   98 F (36.7 C)  TempSrc: Oral     SpO2: 93% 95% 92% 95%  Weight:      Height:       No data found.   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 EYES: No pallor or icterus ENT: MMM, + tracheostomy CV: RRR PULM: CTA B ABD: soft, grossly obese, NT, +BS CNS: AAO x 3, non focal EXT: No edema or tenderness      Data Reviewed:   I have personally reviewed following labs and imaging studies:  Labs: Labs show the following:   Basic Metabolic Panel: Recent Labs  Lab 03/04/20 0200 03/05/20 0126 03/06/20 0012 03/07/20 0123  NA 138 137 137  --   K 3.5 3.8 4.1  --   CL 95* 95* 97*  --   CO2 30 30 28   --   GLUCOSE 135* 110* 97  --   BUN 19 15 16   --   CREATININE 0.66 0.73 0.65  --   CALCIUM 9.0 9.3 9.4  --   MG  --  1.8  --  1.9  PHOS  --  4.9*  --  4.7*   GFR Estimated Creatinine Clearance: 178 mL/min (by C-G formula based on SCr of 0.65 mg/dL). Liver Function Tests: No results for input(s): AST, ALT, ALKPHOS, BILITOT, PROT, ALBUMIN in the last 168 hours. No results for input(s): LIPASE, AMYLASE in the last 168 hours. No results for  input(s): AMMONIA in the last 168 hours. Coagulation profile No results for input(s): INR, PROTIME in the last 168 hours.  CBC: Recent Labs  Lab 03/04/20 0200 03/05/20 0126 03/06/20 0012  WBC 9.7 10.3 9.6  HGB 9.5* 10.0* 9.8*  HCT 29.7* 30.6* 32.2*  MCV 87.6 87.2 89.4  PLT 396 419* 428*   Cardiac Enzymes: No results for input(s): CKTOTAL, CKMB, CKMBINDEX, TROPONINI in the last 168 hours. BNP (last 3 results) No results for input(s): PROBNP in the last 8760 hours. CBG: Recent Labs  Lab 03/09/20 1641 03/09/20 2001 03/09/20 2310 03/10/20 0306 03/10/20 0736  GLUCAP 91 91 91 90 96   D-Dimer: No results for input(s): DDIMER in the last 72 hours. Hgb A1c: No results for input(s): HGBA1C in the last 72 hours. Lipid Profile: No results for input(s): CHOL, HDL, LDLCALC, TRIG, CHOLHDL, LDLDIRECT in the last 72 hours. Thyroid function studies: No results for input(s): TSH, T4TOTAL, T3FREE, THYROIDAB in the last 72 hours.  Invalid input(s): FREET3 Anemia work up: No results for input(s): VITAMINB12, FOLATE, FERRITIN, TIBC, IRON, RETICCTPCT in the last 72 hours. Sepsis Labs: Recent Labs  Lab 03/04/20 0200 03/05/20 0126 03/06/20 0012  WBC 9.7 10.3 9.6    Microbiology No results found for this or any previous visit (from the past 240 hour(s)).  Procedures and diagnostic studies:  No results found.             LOS: 30 days   [facility]  Pager on [email or web address]. If 7PM-7AM, please contact night-coverage at [email or web address]     03/10/2020, 2:12 PM

## 2020-03-11 DIAGNOSIS — Z930 Tracheostomy status: Secondary | ICD-10-CM | POA: Diagnosis not present

## 2020-03-11 DIAGNOSIS — J398 Other specified diseases of upper respiratory tract: Secondary | ICD-10-CM | POA: Diagnosis not present

## 2020-03-11 DIAGNOSIS — J40 Bronchitis, not specified as acute or chronic: Secondary | ICD-10-CM | POA: Diagnosis not present

## 2020-03-11 MED ORDER — ALPRAZOLAM 0.5 MG PO TABS
0.5000 mg | ORAL_TABLET | Freq: Three times a day (TID) | ORAL | Status: DC | PRN
  Administered 2020-03-11 – 2020-03-12 (×3): 0.5 mg via ORAL
  Filled 2020-03-11 (×3): qty 1

## 2020-03-11 NOTE — Progress Notes (Signed)
[facility] PCCM  BIPAP machine settings (per mom) Resmed [id number] IPAP 24 EPAP 19 Ordered by Dr. Twanna Hy, MD [facility] Pager: [phone] Cell: [phone] If no response, call [phone]

## 2020-03-11 NOTE — Progress Notes (Signed)
Nutrition Follow-up  DOCUMENTATION CODES:   Morbid obesity  INTERVENTION:    Ensure Max po BID to maximize oral protein intake, each supplement provides 150 kcal and 30 grams of protein  MVI with minerals daily  NUTRITION DIAGNOSIS:   Inadequate oral intake related to inability to eat as evidenced by NPO status.  Resolved  GOAL:   Patient will meet greater than or equal to 90% of their needs  Progressing   MONITOR:   Vent status,Labs,TF tolerance,Weight trends  REASON FOR ASSESSMENT:   Ventilator,Consult Enteral/tube feeding initiation and management  ASSESSMENT:   40 yo male admitted with acute on chronic hypercapnic respiratory failure. Required intubation in the ED. PMH includes OHS/OSA (home oxygen 3 L), DM, HF, asthma, COPD, cognitive developmental delay, psychotic disorder.  12/23- trach  01/05- Gastric Cortrak placed  01/11- Diet advanced to regular  01/13- Cortrak pulled   On trach collar. Having issue with trach "closing off." May need another trach change. Showed RD wound underneath the trach and reported its painful. Appetite has been off/on over the last two days due to these issues. Denies issues with swallowing. Did not eat breakfast as he feels anxious. Drinking Ensure BID and willing to continue.   Admission weight: 172.6 kg  Current weight: 161.6 kg   UOP: 350 ml x 24 hrs   Medications: colace, 40 mg lasix daily, miralax, 20 mEq KCl daily, senokot   Labs: CBG 90-119  Diet Order:   Diet Order            Diet regular Room service appropriate? Yes; Fluid consistency: Thin  Diet effective now                 EDUCATION NEEDS:   No education needs have been identified at this time  Skin:  Skin Assessment: Skin Integrity Issues: Skin Integrity Issues:: Stage II Stage II: R neck Other: -  Last BM:  1/19  Height:   Ht Readings from Last 1 Encounters:  02/10/20 5\' 5"  (1.651 m)    Weight:   Wt Readings from Last 1 Encounters:   03/08/20 (!) 161.6 kg    Ideal Body Weight:  61.8 kg  BMI:  Body mass index is 59.29 kg/m.  Estimated Nutritional Needs:   Kcal:  2200-2400  Protein:  120-140 grams  Fluid:  >2 L/day  Mariana Single RD, LDN Clinical Nutrition Pager listed in [place]

## 2020-03-11 NOTE — Progress Notes (Signed)
RN made aware of pt silencing alarms on step down monitor. RN educated pt on the importance of monitors to track pts progression. RN also educated pt on the necessity for alarms regarding pt safety/wellbeing.

## 2020-03-11 NOTE — Progress Notes (Signed)
Pt called out for assistance with trach. Pt able to talk and O2 sats > 90%. RN suctioned with barely any passage. RN contacted RT for further evaluation. Pts Mother later called about concern for trach "closing off". RN updated Mom about RT evaluation and personal assessment. RN also informed Mom that it would be beneficial if pt didnt use the yankauer as a suction catheter. This puts pt at high risk for internal trauma. Pt educated and verbally agreed.

## 2020-03-11 NOTE — Progress Notes (Signed)
Having trouble with ventilating through the tracheostomy.  He is able to phonate without using the Passy-Muir valve.  He is not in any distress but he feels very anxious about his breathing.  He is completely awake and alert.  He is having a hard time lying back.  He has been off the ventilator for at least a few days now.  I was able to remove the tracheostomy and replace it with a #8 XLT uncuffed.  His breathing significantly improved and he is not able to phonate unless the Passy-Muir valve is in place.  I think he will do well with this type of tracheostomy now that he is no longer on the ventilator.  He can follow-up in the tracheostomy clinic and in my office for future changes.

## 2020-03-11 NOTE — Plan of Care (Signed)
  Problem: Clinical Measurements: Goal: Respiratory complications will improve Outcome: Progressing   Problem: Clinical Measurements: Goal: Cardiovascular complication will be avoided Outcome: Progressing   Problem: Activity: Goal: Risk for activity intolerance will decrease Outcome: Progressing   Problem: Coping: Goal: Level of anxiety will decrease Outcome: Progressing   Problem: Pain Managment: Goal: General experience of comfort will improve Outcome: Progressing   

## 2020-03-11 NOTE — Progress Notes (Signed)
NAME:  Shawn Ramsey, MRN:  [id number], DOB:  [birth year], LOS: 74 ADMISSION DATE:  02/09/2020, CONSULTATION DATE:  12/24 REFERRING MD:  Denton Brick CHIEF COMPLAINT:  Drowsiness/dyspnea   Brief History:  40 y/o male with OHS/OSA and asthma intubated for acute on chronic hypercapnic respiratory failure.  Failed NIMV in ED, some oropharyngeal trauma on intubation attempt.  Past Medical History:  Asthma OHS/OSA on home O2 3L not yet adherent to BiPAP, followed in Dr. Juanetta Gosling clinic Diabetes HFpEF  Significant Hospital Events:  12/19 intubated  12/19 admitted 1/3 > Desaturation and near arrest.  Unable to bag through trach initially so code called for possible intubation from above.  Bagging then became easier, ? Plugging.  Had bronch performed but no plugs seen, only tracheomalacia.  1/4 > elevated peak pressures and distress, bedside bronch performed and trach noted to be up against posterior wall of pharynx.  Trach advanced and repositioned with improvement in peak pressures and pt comfort. 1/5 > ongoing bleeding from trach.  Trach wound looks a bit worse.  Calling ENT back to please re-evaluate-trach changed to Bivona adjustable flange  8.0 mm inner diameter 130 mm length 1/7 Unable to tolerate PS, worsening infiltrates on CXR 1/12  plan for t collar 1/13 24 hrs trach tolerance   Consults:  ENT  Procedures:  12/19 ETT > 12/23 12/23 Trach >  1/7 Bronchoscopy to identify trach positioning  Significant Diagnostic Tests:  02/09/20 CXR with RUL infiltrate vs atelectasis new post-intubation relative to pre-intubation, retrocardiac opacity  1/7 CXR>>Increased bilateral confluent peripheral predominant heterogeneous opacities. Differential considerations include infection or confluent pulmonary edema/hemorrhage.  Micro Data:  MRSA screen 12/20 > positive Sputum 12/26>>  MRSA 12/20 blood > ngtd Blood 12/26 > staph caprae in 1/4 bottles 1/7 Respiratory culture > MRSA presumed  colinizer, Pseudomonas ss cefepime  Antimicrobials:  Ceftriaxone 12/19 > 12/21.  Azithromycin 12/19 > 12/21. Zosyn 12/25 > 12/26. Linezolid 12/27 > 1/2. Cefepime 1/9 > (plan to DC 1/16) Vancomycin 1/9 >off  Interim History / Subjective:   Patient reports feeling well this morning. He reports he continues to have thick secretions. He states overnight he felt like the tracheostomy tube was clogged. He denies any pain around the ET tube however does report he notices increased drainage from the wound.   Objective   Blood pressure 134/66, pulse (!) 102, temperature 98.4 F (36.9 C), temperature source Oral, resp. rate 20, height 5\' 5"  (1.651 m), weight (!) 161.6 kg, SpO2 91 %.    FiO2 (%):  [35 %] 35 %   Intake/Output Summary (Last 24 hours) at 03/11/2020 0956 Last data filed at 03/10/2020 2003 Gross per 24 hour  Intake -  Output 350 ml  Net -350 ml   Filed Weights   03/06/20 2227 03/07/20 0500 03/08/20 0258  Weight: (!) 157.9 kg (!) 157.3 kg (!) 161.6 kg    Examination: General:  Sitting comfortably at the bedside, NAD, morbidly obese PULM: limited evaluation due to body habitus, normal work of breathing  CV: RRR, no murmurs Abdomen: soft, non-distended, bowel sounds present Skin: pressure injury around tracheostomy site, no tenderness around site MSK: normal bulk and tone Neuro: awake, alert and oriented x3   Resolved Hospital Problem list   N/A  Assessment & Plan:   Acute on chronic hypoxemic respiratory failure Tracheostomy status Obesity hypoventilation syndrome In the setting of morbid obesity, sleep apnea noncompliant with CPAP and asthma. Admitted for respiratory failure and intubation, further requiring tracheostomy tube. Course was  complicated by tracheostomy extravasation leading to occulusion with desaturations. Bivona trach in place, positioned at 14 cm. Patient's overall goal remains to eventually be de-cannulated. Tracheostomy tube site looks clean and dry.   - Continue tracheostomy management per routine - Will need follow up in ENT clinic, but would also be served well in the PCCM tracheostomy clinic and the pulmonary clinic in [place] - Need to get his home settings of BIPAP (manufacturer, settings, etc) so he can move to the pulmonary clinic - Continue bivona positioned at 14cm  MRSA, Pseudomonas tracheobronchitis Completed IV antibiotics. Will continue to monitor for s/sxs of infection.   Asthma -Continue brovana/pulmicort -Prn albuterol  Muscular deconditioning PT/OT   Best practice (evaluated daily)   Per Primary  Updated patient's mother again today, 1/19.   Goals of Care:   Per primary  Labs   CBC: Recent Labs  Lab 03/05/20 0126 03/06/20 0012  WBC 10.3 9.6  HGB 10.0* 9.8*  HCT 30.6* 32.2*  MCV 87.2 89.4  PLT 419* 428*    Basic Metabolic Panel: Recent Labs  Lab 03/05/20 0126 03/06/20 0012 03/07/20 0123  NA 137 137  --   K 3.8 4.1  --   CL 95* 97*  --   CO2 30 28  --   GLUCOSE 110* 97  --   BUN 15 16  --   CREATININE 0.73 0.65  --   CALCIUM 9.3 9.4  --   MG 1.8  --  1.9  PHOS 4.9*  --  4.7*   GFR: Estimated Creatinine Clearance: 178 mL/min (by C-G formula based on SCr of 0.65 mg/dL). Recent Labs  Lab 03/05/20 0126 03/06/20 0012  WBC 10.3 9.6    Liver Function Tests: No results for input(s): AST, ALT, ALKPHOS, BILITOT, PROT, ALBUMIN in the last 168 hours. No results for input(s): LIPASE, AMYLASE in the last 168 hours. No results for input(s): AMMONIA in the last 168 hours.  ABG    Component Value Date/Time   PHART 7.331 (L) 03/01/2020 0635   PCO2ART 73.5 (HH) 03/01/2020 0635   PO2ART 101 03/01/2020 0635   HCO3 38.0 (H) 03/01/2020 0635   TCO2 36 (H) 02/16/2020 0747   ACIDBASEDEF 1.8 03/07/2014 0500   O2SAT 96.9 03/01/2020 0635     Coagulation Profile: No results for input(s): INR, PROTIME in the last 168 hours.  Cardiac Enzymes: No results for input(s): CKTOTAL, CKMB,  CKMBINDEX, TROPONINI in the last 168 hours.  HbA1C: Hemoglobin A1C  Date/Time Value Ref Range Status  08/22/2019 04:15 PM 7.1 (A) 4.0 - 5.6 % Final   Hgb A1c MFr Bld  Date/Time Value Ref Range Status  01/03/2019 03:41 PM 6.7 (H) <5.7 % of total Hgb Final    Comment:    For someone without known diabetes, a hemoglobin A1c value of 6.5% or greater indicates that they may have  diabetes and this should be confirmed with a follow-up  test. . For someone with known diabetes, a value <7% indicates  that their diabetes is well controlled and a value  greater than or equal to 7% indicates suboptimal  control. A1c targets should be individualized based on  duration of diabetes, age, comorbid conditions, and  other considerations. . Currently, no consensus exists regarding use of hemoglobin A1c for diagnosis of diabetes for children. [name]   09/18/2018 09:25 AM 6.5 (H) <5.7 % of total Hgb Final    Comment:    For someone without known diabetes, a hemoglobin A1c value of 6.5%  or greater indicates that they may have  diabetes and this should be confirmed with a follow-up  test. . For someone with known diabetes, a value <7% indicates  that their diabetes is well controlled and a value  greater than or equal to 7% indicates suboptimal  control. A1c targets should be individualized based on  duration of diabetes, age, comorbid conditions, and  other considerations. . Currently, no consensus exists regarding use of hemoglobin A1c for diagnosis of diabetes for children. .     CBG: Recent Labs  Lab 03/09/20 1641 03/09/20 2001 03/09/20 2310 03/10/20 0306 03/10/20 0736  GLUCAP 91 91 91 90 96     Critical care time: n/a    Harlow Ohms, DO PGY-2 Internal Medicine   [facility] Pager: [phone] Cell: [phone] If no response, call [phone]

## 2020-03-11 NOTE — Progress Notes (Signed)
PROGRESS NOTE    Shawn Ramsey  QBH:[id number] DOB: 11/16/1980 DOA: 02/09/2020 PCP: Fayrene Helper, MD   Brief Narrative:  Shawn Ramsey is a 40 y.o. male with asthma and COPD has chronic respiratory failure with hypercapnia was admitted 02/09/2020 with acute on chronic respiratory failure with hypercapnia. Patient is admitted to [facility] was intubated. Patient underwent a tracheostomy 02/13/2020.Care of the tracheostomy has been complicated requiring ongoing ENT and PCCM involvement. Patient is transferred out of the ICU to hospitalist care on 03/08/2020.  Significant Hospital events:  12/19 intubated  12/19 admitted 1/3 >Desaturation and near arrest. Unable to bag through trach initially so code called for possible intubation from above. Bagging then became easier, ? Plugging. Had bronch performed but no plugs seen, only tracheomalacia.  1/4 >elevated peak pressures and distress, bedside bronch performed and trach noted to be up against posterior wall of pharynx. Trach advanced and repositioned with improvement in peak pressures and pt comfort. 1/5 >ongoing bleeding from trach. Trach wound looks a bit worse. Calling ENT back to please re-evaluate-trach changed to Bivona adjustable flange 8.0 mm inner diameter 130 mm length 1/7 Unable to tolerate PS, worsening infiltrates on CXR 1/12 plan for t collar 1/13: 24 hrs trach tolerance  Assessment & Plan:  Acute on chronic hypoxemic respiratory failure: -In the setting of obesity hypoventilation syndrome and asthma.  Patient has known tracheomalacia and had difficulty with intubation.  He is post trach by ENT and replaced with 8 mm x 1 on 02/26/2020.  His course is complicated by tracheostomy extravasation leading to occlusion with desaturations.  Bivona  trach in place, positioned at 14 cm. -Requiring 35% of FiO2 and 8 L/min of oxygen. -Appreciate PCCM's help -Talked to ENT-Dr. Constance Holster  regarding patient's trach  issues-he will come and evaluate the patient later this afternoon.  MRSA/Pseudomonas tracheobronchitis: -He completed 7-day course of cefepime for Pseudomonas.  Asthma: No wheezing noted on exam.  Continue bronchodilators as needed-albuterol, Brovana, Pulmicort.  Hypertension: Stable.  Continue Lasix and Coreg.  Monitor blood pressure closely  Acute metabolic encephalopathy: Resolved.  Generalized anxiety: Continue Xanax as needed  GERD: Continue PPI  Morbid obesity with BMI of 59: Diet modification/exercise and weight loss recommended -Dietitian consulted recommended Ensure max p.o. twice daily, MVI with minerals daily.  Heart failure with preserved ejection fraction: Patient appears euvolemic.  Continue Lasix.  Strict INO's and daily weight.  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  Appreciate wound care recommendations.  DVT prophylaxis: Lovenox Code Status: Full code Family Communication:  None present at bedside.  Plan of care discussed with patient in length and he verbalized understanding and agreed with it.  I called patient's mom and discussed plan of care and she verbalized understanding  Disposition Plan: home  Consultants:   PCCM  ENT  Procedures:   Intubation  Tracheostomy  Antimicrobials:   See chart  Status is: Inpatient   Dispo: The patient is from: Home              Anticipated d/c is to: Home              Anticipated d/c date is: 2 days              Patient currently is not medically stable to d/c.    Subjective: Patient seen and examined.  Sitting on the edge of the bed.  Complaining that he has problem with the breathing and his trach is not  working properly.  He said that he is anxious because of his breathing issues.  Denies fever, chills, chest pain, wheezing, leg swelling, orthopnea or PND.  Objective: Vitals:   03/11/20 0421 03/11/20 0749 03/11/20 0759 03/11/20 0800  BP: (!) 141/96 134/66     Pulse:  99 (!) 102   Resp: 17 17 20 16   Temp: 98 F (36.7 C) 98.4 F (36.9 C)    TempSrc: Oral Oral    SpO2: 94%  91%   Weight:      Height:        Intake/Output Summary (Last 24 hours) at 03/11/2020 1309 Last data filed at 03/10/2020 2003 Gross per 24 hour  Intake -  Output 350 ml  Net -350 ml   Filed Weights   03/06/20 2227 03/07/20 0500 03/08/20 [id number]  Weight: (!) 157.9 kg (!) 157.3 kg (!) 161.6 kg    Examination:  General exam: Appears calm and comfortable, morbidly obese, has track, appears anxious and dyspneic. Respiratory system: No wheezing or rhonchi or crackles.   Cardiovascular system: S1 & S2 heard, RRR. No JVD, murmurs, rubs, gallops or clicks. No pedal edema. Gastrointestinal system: Abdomen is nondistended, soft and nontender. No organomegaly or masses felt. Normal bowel sounds heard. Central nervous system: Alert and oriented. No focal neurological deficits. Extremities: Symmetric 5 x 5 power. Skin: No rashes, lesions or ulcers Psychiatry: Judgement and insight appear normal. Mood & affect appropriate.    Data Reviewed: I have personally reviewed following labs and imaging studies  CBC: Recent Labs  Lab 03/05/20 0126 03/06/20 0012  WBC 10.3 9.6  HGB 10.0* 9.8*  HCT 30.6* 32.2*  MCV 87.2 89.4  PLT 419* 123456*   Basic Metabolic Panel: Recent Labs  Lab 03/05/20 0126 03/06/20 0012 03/07/20 0123  NA 137 137  --   K 3.8 4.1  --   CL 95* 97*  --   CO2 30 28  --   GLUCOSE 110* 97  --   BUN 15 16  --   CREATININE 0.73 0.65  --   CALCIUM 9.3 9.4  --   MG 1.8  --  1.9  PHOS 4.9*  --  4.7*   GFR: Estimated Creatinine Clearance: 178 mL/min (by C-G formula based on SCr of 0.65 mg/dL). Liver Function Tests: No results for input(s): AST, ALT, ALKPHOS, BILITOT, PROT, ALBUMIN in the last 168 hours. No results for input(s): LIPASE, AMYLASE in the last 168 hours. No results for input(s): AMMONIA in the last 168 hours. Coagulation Profile: No results  for input(s): INR, PROTIME in the last 168 hours. Cardiac Enzymes: No results for input(s): CKTOTAL, CKMB, CKMBINDEX, TROPONINI in the last 168 hours. BNP (last 3 results) No results for input(s): PROBNP in the last 8760 hours. HbA1C: No results for input(s): HGBA1C in the last 72 hours. CBG: Recent Labs  Lab 03/09/20 1641 03/09/20 2001 03/09/20 2310 03/10/20 0306 03/10/20 0736  GLUCAP 91 91 91 90 96   Lipid Profile: No results for input(s): CHOL, HDL, LDLCALC, TRIG, CHOLHDL, LDLDIRECT in the last 72 hours. Thyroid Function Tests: No results for input(s): TSH, T4TOTAL, FREET4, T3FREE, THYROIDAB in the last 72 hours. Anemia Panel: No results for input(s): VITAMINB12, FOLATE, FERRITIN, TIBC, IRON, RETICCTPCT in the last 72 hours. Sepsis Labs: No results for input(s): PROCALCITON, LATICACIDVEN in the last 168 hours.  No results found for this or any previous visit (from the past 240 hour(s)).    Radiology Studies: No results found.  Scheduled Meds: .  arformoterol  15 mcg Nebulization BID  . atorvastatin  40 mg Oral Daily  . budesonide (PULMICORT) nebulizer solution  0.25 mg Nebulization BID  . carvedilol  6.25 mg Oral BID WC  . chlorhexidine  15 mL Mouth Rinse BID  . Chlorhexidine Gluconate Cloth  6 each Topical Daily  . docusate sodium  100 mg Oral BID  . enoxaparin (LOVENOX) injection  80 mg Subcutaneous Q24H  . furosemide  40 mg Oral Daily  . loratadine  10 mg Oral Daily  . mouth rinse  15 mL Mouth Rinse q12n4p  . multivitamin with minerals  1 tablet Oral Daily  . nystatin  5 mL Mouth/Throat QID  . pantoprazole  40 mg Oral Daily  . polyethylene glycol  17 g Oral Daily  . potassium chloride  20 mEq Oral Daily  . Ensure Max Protein  11 oz Oral BID  . senna  2 tablet Oral BID  . sodium chloride flush  10-40 mL Intracatheter Q12H   Continuous Infusions: . sodium chloride Stopped (03/06/20 1033)     LOS: 31 days   Time spent: 40 minutes   Jylan Loeza Loann Quill,  MD [facility]  If 7PM-7AM, please contact night-coverage [email or web address] 03/11/2020, 1:09 PM

## 2020-03-12 DIAGNOSIS — J9602 Acute respiratory failure with hypercapnia: Secondary | ICD-10-CM | POA: Diagnosis not present

## 2020-03-12 DIAGNOSIS — Z978 Presence of other specified devices: Secondary | ICD-10-CM | POA: Diagnosis not present

## 2020-03-12 DIAGNOSIS — J9621 Acute and chronic respiratory failure with hypoxia: Secondary | ICD-10-CM | POA: Diagnosis not present

## 2020-03-12 DIAGNOSIS — G9340 Encephalopathy, unspecified: Secondary | ICD-10-CM | POA: Diagnosis not present

## 2020-03-12 LAB — BASIC METABOLIC PANEL
Anion gap: 11 (ref 5–15)
BUN: 9 mg/dL (ref 6–20)
CO2: 27 mmol/L (ref 22–32)
Calcium: 9.1 mg/dL (ref 8.9–10.3)
Chloride: 100 mmol/L (ref 98–111)
Creatinine, Ser: 0.58 mg/dL — ABNORMAL LOW (ref 0.61–1.24)
GFR, Estimated: >60 mL/min (ref >60)
Glucose, Bld: 99 mg/dL (ref 70–99)
Potassium: 3.4 mmol/L — ABNORMAL LOW (ref 3.5–5.1)
Sodium: 138 mmol/L (ref 135–145)

## 2020-03-12 LAB — CBC
HCT: 29.2 % — ABNORMAL LOW (ref 39.0–52.0)
Hemoglobin: 9.4 g/dL — ABNORMAL LOW (ref 13.0–17.0)
MCH: 27.9 pg (ref 26.0–34.0)
MCHC: 32.2 g/dL (ref 30.0–36.0)
MCV: 86.6 fL (ref 80.0–100.0)
Platelets: 357 10*3/uL (ref 150–400)
RBC: 3.37 MIL/uL — ABNORMAL LOW (ref 4.22–5.81)
RDW: 15 % (ref 11.5–15.5)
WBC: 8 10*3/uL (ref 4.0–10.5)
nRBC: 0 % (ref 0.0–0.2)

## 2020-03-12 LAB — MAGNESIUM: Magnesium: 1.7 mg/dL (ref 1.7–2.4)

## 2020-03-12 MED ORDER — MENTHOL 3 MG MT LOZG
1.0000 | LOZENGE | OROMUCOSAL | Status: DC | PRN
  Filled 2020-03-12: qty 9

## 2020-03-12 MED ORDER — POTASSIUM CHLORIDE 20 MEQ PO PACK
40.0000 meq | PACK | Freq: Once | ORAL | Status: AC
  Administered 2020-03-12: 40 meq via ORAL
  Filled 2020-03-12: qty 2
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
## 2020-03-12 NOTE — Progress Notes (Signed)
  Speech Language Pathology Treatment: Nada Boozer Speaking valve  Patient Details Name: Shawn Ramsey MRN: [id number] DOB: 06-Apr-1980 Today's Date: 03/12/2020 Time: 9791-5041 SLP Time Calculation (min) (ACUTE ONLY): 20 min  Assessment / Plan / Recommendation Clinical Impression  Pt was seen for PMSV treatment with his mother present. Woody, RN reported that the pt has been using the PMSV today with independent donning and doffing. Pt's mother was educated regarding the anatomy of the larynx, the impact of the trach on phonation, and pt's progress with therapy. Both parties were educated regarding PMSV care, cleaning, and precautions. They were referred to the printed PMSV educational materials in his room, and pertinent pages were flagged by this SLP. Pt demonstrated independence with donning and doffing PMSV, and both parties verbalized understanding regarding all areas of education. PMSV was in place upon SLP's entry and he tolerated it well throughout the session without respiratory distress. Speech was intelligible and breath support adequate. Pt may use PMSV during all waking hours with removal for sleep. Further skilled SLP services are not clinically indicated at this time.    HPI HPI: Pt is a 40 y.o. male with PMH morbid obesity and sleep apnea. Pt was admitted with respiratory distress. Pt intubated on admission on 36/43 with complication of some posterior oropharyngeal trauma with bleeding noted there during anesthesia's attempt. Trach placed by ENT on 12/23. CXR 12/19: CXR with RUL infiltrate vs atelectasis new post-intubation relative to pre-intubation, retrocardiac opacity. Trach changed to XLT #8 uncuffed on 1/19 by ENT.      SLP Plan  All goals met;Discharge SLP treatment due to (comment)       Recommendations  Diet recommendations: Regular;Thin liquid Liquids provided via: Cup;Straw Medication Administration: Whole meds with liquid Supervision: Patient able to self  feed Compensations: Slow rate;Small sips/bites Postural Changes and/or Swallow Maneuvers: Seated upright 90 degrees      Patient may use Passy-Muir Speech Valve: During PO intake/meals;During all waking hours (remove during sleep) PMSV Supervision: Intermittent MD: Please consider changing trach tube to : Smaller size         Oral Care Recommendations: Oral care BID Follow up Recommendations: 24 hour supervision/assistance SLP Visit Diagnosis: Aphonia (R49.1) Plan: All goals met;Discharge SLP treatment due to (comment)       Valree Feild I. Hardin Negus, [place], [place] Office number [phone] Pager [place] 03/12/2020, 3:08 PM

## 2020-03-12 NOTE — Progress Notes (Signed)
PT Cancellation Note  Patient Details Name: THURMON MIZELL MRN: [id number] DOB: Jun 14, 1980   Cancelled Treatment:    Reason Eval/Treat Not Completed: Other (comment) (eating)   Tylan Briguglio B Ozias Dicenzo 03/12/2020, 8:55 AM Bayard Males, PT Acute Rehabilitation Services Pager: [phone] Office: [phone]

## 2020-03-12 NOTE — Progress Notes (Addendum)
PROGRESS NOTE    JAPETH Ramsey  BDZ:[id number] DOB: [birth year] DOA: 02/09/2020 PCP: Kerri Perches, MD   Brief Narrative:  Shawn Ramsey is a 40 y.o. male with asthma and COPD has chronic respiratory failure with hypercapnia was admitted 02/09/2020 with acute on chronic respiratory failure with hypercapnia. Patient is admitted to [facility] was intubated. Patient underwent a tracheostomy 02/13/2020.Care of the tracheostomy has been complicated requiring ongoing ENT and PCCM involvement. Patient is transferred out of the ICU to hospitalist care on 03/08/2020.  Significant Hospital events:  12/19 intubated  12/19 admitted 1/3 >Desaturation and near arrest. Unable to bag through trach initially so code called for possible intubation from above. Bagging then became easier, ? Plugging. Had bronch performed but no plugs seen, only tracheomalacia.  1/4 >elevated peak pressures and distress, bedside bronch performed and trach noted to be up against posterior wall of pharynx. Trach advanced and repositioned with improvement in peak pressures and pt comfort. 1/5 >ongoing bleeding from trach. Trach wound looks a bit worse. Calling ENT back to please re-evaluate-trach changed to Bivona adjustable flange 8.0 mm inner diameter 130 mm length 1/7 Unable to tolerate PS, worsening infiltrates on CXR 1/12 plan for t collar 1/13: 24 hrs trach tolerance  Assessment & Plan:  Acute on chronic hypoxemic respiratory failure: -In the setting of obesity hypoventilation syndrome and asthma.  Patient has known tracheomalacia and had difficulty with intubation.  He is post trach by ENT and replaced with 8 mm x 1 on 02/26/2020.  His course is complicated by tracheostomy extravasation leading to occlusion with desaturations.  Bivona  trach in place, positioned at 14 cm. -Appreciate PCCM's help -Evaluated by ENT Dr. Pollyann Kennedy on 1/19-his tracheostomy tube was replaced with the #8 XLT uncuffed.  His  breathing significantly improved since then.  He is not able to phonate unless the Passy-Muir valve is in place. -Needs outpatient follow-up at tracheostomy clinic, ENT, pulmonary  MRSA/Pseudomonas tracheobronchitis: -He completed 7-day course of cefepime for Pseudomonas.  Asthma: No wheezing noted on exam.  Continue bronchodilators as needed-albuterol, Brovana, Pulmicort.  Hypertension: Stable.  Continue Lasix and Coreg.  Monitor blood pressure closely  Acute metabolic encephalopathy: Resolved.  Generalized anxiety: Continue Xanax as needed  GERD: Continue PPI  Morbid obesity with BMI of 59: Diet modification/exercise and weight loss recommended -Dietitian consulted recommended Ensure max p.o. twice daily, MVI with minerals daily.  Heart failure with preserved ejection fraction: Patient appears euvolemic.  Continue Lasix.  Strict INO's and daily weight.  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  Appreciate wound care recommendations.  Hypokalemia: Replenished.  Check magnesium level.  Repeat BMP tomorrow a.m.  Normocytic anemia: H&H is stable.  Continue to monitor.  Patient improved overall after replacement of tracheostomy tube by ENT yesterday.  TOC -arranged home health for the patient.  Awaiting trach supplies to be arranged and to make sure that patient's home oxygen is working properly.  RT-provided training to patient's mom.  DVT prophylaxis: Lovenox Code Status: Full code Family Communication:  None present at bedside.  Plan of care discussed with patient in length and he verbalized understanding and agreed with it.  I called patient's mom and discussed plan of care and she verbalized understanding  Disposition Plan: home with home health  Consultants:   PCCM  ENT  Procedures:   Intubation  Tracheostomy  Antimicrobials:   See chart  Status is: Inpatient   Dispo: The patient is from: Home  Anticipated d/c  is to: Home with home health              Anticipated d/c date is: 1 day              Patient currently is not medically stable to d/c.    Subjective: Patient seen and examined.  Sitting comfortably on the bed.  Tells me that his breathing has improved a lot since replacement of tracheostomy tube.  No acute events overnight.  No anxiety issues.  Remained afebrile. Objective: Vitals:   03/12/20 0216 03/12/20 0302 03/12/20 0900 03/12/20 1225  BP:  (!) 127/95    Pulse: 99 93    Resp:  16    Temp:  98.2 F (36.8 C)    TempSrc:  Oral    SpO2: 94% 92% 100% 95%  Weight:      Height:        Intake/Output Summary (Last 24 hours) at 03/12/2020 1447 Last data filed at 03/12/2020 9562 Gross per 24 hour  Intake --  Output 850 ml  Net -850 ml   Filed Weights   03/06/20 2227 03/07/20 0500 03/08/20 1308  Weight: (!) 157.9 kg (!) 157.3 kg (!) 161.6 kg    Examination:  General exam: Appears calm and comfortable, morbidly obese, has trach, communicating well.   Respiratory system: No wheezing or rhonchi or crackles.   Cardiovascular system: S1 & S2 heard, RRR. No JVD, murmurs, rubs, gallops or clicks. No pedal edema. Gastrointestinal system: Abdomen is nondistended, soft and nontender. No organomegaly or masses felt. Normal bowel sounds heard. Central nervous system: Alert and oriented. No focal neurological deficits. Extremities: Symmetric 5 x 5 power. Skin: No rashes, lesions or ulcers Psychiatry: Judgement and insight appear normal. Mood & affect appropriate.    Data Reviewed: I have personally reviewed following labs and imaging studies  CBC: Recent Labs  Lab 03/06/20 0012 03/12/20 0712  WBC 9.6 8.0  HGB 9.8* 9.4*  HCT 32.2* 29.2*  MCV 89.4 86.6  PLT 428* 657   Basic Metabolic Panel: Recent Labs  Lab 03/06/20 0012 03/07/20 0123 03/12/20 0712  NA 137  --  138  K 4.1  --  3.4*  CL 97*  --  100  CO2 28  --  27  GLUCOSE 97  --  99  BUN 16  --  9  CREATININE 0.65  --   0.58*  CALCIUM 9.4  --  9.1  MG  --  1.9  --   PHOS  --  4.7*  --    GFR: Estimated Creatinine Clearance: 178 mL/min (A) (by C-G formula based on SCr of 0.58 mg/dL (L)). Liver Function Tests: No results for input(s): AST, ALT, ALKPHOS, BILITOT, PROT, ALBUMIN in the last 168 hours. No results for input(s): LIPASE, AMYLASE in the last 168 hours. No results for input(s): AMMONIA in the last 168 hours. Coagulation Profile: No results for input(s): INR, PROTIME in the last 168 hours. Cardiac Enzymes: No results for input(s): CKTOTAL, CKMB, CKMBINDEX, TROPONINI in the last 168 hours. BNP (last 3 results) No results for input(s): PROBNP in the last 8760 hours. HbA1C: No results for input(s): HGBA1C in the last 72 hours. CBG: Recent Labs  Lab 03/09/20 1641 03/09/20 2001 03/09/20 2310 03/10/20 0306 03/10/20 0736  GLUCAP 91 91 91 90 96   Lipid Profile: No results for input(s): CHOL, HDL, LDLCALC, TRIG, CHOLHDL, LDLDIRECT in the last 72 hours. Thyroid Function Tests: No results for input(s): TSH, T4TOTAL, FREET4,  T3FREE, THYROIDAB in the last 72 hours. Anemia Panel: No results for input(s): VITAMINB12, FOLATE, FERRITIN, TIBC, IRON, RETICCTPCT in the last 72 hours. Sepsis Labs: No results for input(s): PROCALCITON, LATICACIDVEN in the last 168 hours.  No results found for this or any previous visit (from the past 240 hour(s)).    Radiology Studies: No results found.  Scheduled Meds: . arformoterol  15 mcg Nebulization BID  . atorvastatin  40 mg Oral Daily  . budesonide (PULMICORT) nebulizer solution  0.25 mg Nebulization BID  . carvedilol  6.25 mg Oral BID WC  . chlorhexidine  15 mL Mouth Rinse BID  . Chlorhexidine Gluconate Cloth  6 each Topical Daily  . docusate sodium  100 mg Oral BID  . enoxaparin (LOVENOX) injection  80 mg Subcutaneous Q24H  . furosemide  40 mg Oral Daily  . loratadine  10 mg Oral Daily  . mouth rinse  15 mL Mouth Rinse q12n4p  . multivitamin with  minerals  1 tablet Oral Daily  . nystatin  5 mL Mouth/Throat QID  . pantoprazole  40 mg Oral Daily  . polyethylene glycol  17 g Oral Daily  . potassium chloride  20 mEq Oral Daily  . Ensure Max Protein  11 oz Oral BID  . senna  2 tablet Oral BID  . sodium chloride flush  10-40 mL Intracatheter Q12H   Continuous Infusions: . sodium chloride Stopped (03/06/20 1033)     LOS: 32 days   Time spent: 40 minutes   Aadi Bordner Loann Quill, MD [facility]  If 7PM-7AM, please contact night-coverage [email or web address] 03/12/2020, 2:47 PM

## 2020-03-12 NOTE — TOC Progression Note (Addendum)
Transition of Care [facility]) - Progression Note    Patient Details  Name: ADA WOODBURY MRN: [id number] Date of Birth: 03-02-1980  [facility] of [facility]) CM/SW Contact  Joanne Chars, LCSW Phone Number: 03/12/2020, 12:59 PM  Clinical Narrative:    CSW has made calls to the following [facility] agencies: [facility]: no, no new trachs [facility]: no, no new trachs [facility]: no, no trachs [facility]: reviewing [facility]: reviewing [facility]: 1400: [facility] for RN, PT, OT  CSW spoke with pt mother, [name], regarding training with respiratory: she will be here in 30 minutes, can't stay all afternoon due to the coming snow, but can do training.  CSW spoke with Butch Penny in respiratory and she can come and do the training at 1330.  CSW spoke with [employer] who will begin working on trach supplies.   Expected Discharge Plan: [place] Barriers to Discharge: Continued Medical Work up  Expected Discharge Plan and Services Expected Discharge Plan: [place]                                               Social Determinants of Health (SDOH) Interventions    Readmission Risk Interventions No flowsheet data found.

## 2020-03-12 NOTE — Plan of Care (Signed)
  Problem: Clinical Measurements: Goal: Respiratory complications will improve Outcome: Progressing   Problem: Health Behavior/Discharge Planning: Goal: Ability to manage health-related needs will improve Outcome: Progressing   Problem: Clinical Measurements: Goal: Cardiovascular complication will be avoided Outcome: Progressing   Problem: Activity: Goal: Risk for activity intolerance will decrease Outcome: Progressing   Problem: Coping: Goal: Level of anxiety will decrease Outcome: Progressing   Problem: Pain Managment: Goal: General experience of comfort will improve Outcome: Progressing   Problem: Safety: Goal: Ability to remain free from injury will improve Outcome: Progressing

## 2020-03-12 NOTE — Progress Notes (Signed)
SATURATION QUALIFICATIONS: (This note is used to comply with regulatory documentation for home oxygen)  Patient Saturations on Room Air at Rest = 90%  Patient Saturations on Room Air while Ambulating = 84%  Patient Saturations on 4 Liters of oxygen while Ambulating = 90-95 %  Please briefly explain why patient needs home oxygen:Pt with desaturation with all mobility on RA with and without PMSV and requires supplemental oxygen to maintain sats >90%  Bayard Males, PT Acute Rehabilitation Services Pager: [phone] Office: [phone]

## 2020-03-13 DIAGNOSIS — Z930 Tracheostomy status: Secondary | ICD-10-CM | POA: Diagnosis not present

## 2020-03-13 DIAGNOSIS — J9602 Acute respiratory failure with hypercapnia: Secondary | ICD-10-CM | POA: Diagnosis not present

## 2020-03-13 DIAGNOSIS — J9621 Acute and chronic respiratory failure with hypoxia: Secondary | ICD-10-CM | POA: Diagnosis not present

## 2020-03-13 DIAGNOSIS — G9340 Encephalopathy, unspecified: Secondary | ICD-10-CM | POA: Diagnosis not present

## 2020-03-13 LAB — BASIC METABOLIC PANEL
Anion gap: 12 (ref 5–15)
BUN: 9 mg/dL (ref 6–20)
CO2: 27 mmol/L (ref 22–32)
Calcium: 9.1 mg/dL (ref 8.9–10.3)
Chloride: 100 mmol/L (ref 98–111)
Creatinine, Ser: 0.57 mg/dL — ABNORMAL LOW (ref 0.61–1.24)
GFR, Estimated: >60 mL/min (ref >60)
Glucose, Bld: 92 mg/dL (ref 70–99)
Potassium: 3.4 mmol/L — ABNORMAL LOW (ref 3.5–5.1)
Sodium: 139 mmol/L (ref 135–145)

## 2020-03-13 MED ORDER — CARVEDILOL 6.25 MG PO TABS: 6.2500 mg | ORAL_TABLET | Freq: Two times a day (BID) | ORAL | 0 refills | Status: DC

## 2020-03-13 MED ORDER — GUAIFENESIN 100 MG/5ML PO SOLN: 5.0000 mL | ORAL | 0 refills | Status: DC | PRN

## 2020-03-13 MED ORDER — COVID-19 MRNA VACC (MODERNA) 100 MCG/0.5ML IM SUSP
0.5000 mL | Freq: Once | INTRAMUSCULAR | Status: DC
  Filled 2020-03-13: qty 0.5

## 2020-03-13 MED ORDER — POTASSIUM CHLORIDE 20 MEQ PO PACK
40.0000 meq | PACK | Freq: Once | ORAL | Status: AC
  Administered 2020-03-13: 40 meq via ORAL
  Filled 2020-03-13: qty 2

## 2020-03-13 MED ORDER — SALINE SPRAY 0.65 % NA SOLN: 1.0000 | NASAL | 0 refills | Status: DC | PRN

## 2020-03-13 MED ORDER — ARFORMOTEROL TARTRATE 15 MCG/2ML IN NEBU: 15.0000 ug | INHALATION_SOLUTION | Freq: Two times a day (BID) | RESPIRATORY_TRACT | 0 refills | Status: DC

## 2020-03-13 MED ORDER — BUDESONIDE 0.25 MG/2ML IN SUSP: 0.2500 mg | Freq: Two times a day (BID) | RESPIRATORY_TRACT | 12 refills | Status: DC

## 2020-03-13 MED ORDER — INFLUENZA VAC A&B SA ADJ QUAD 0.5 ML IM PRSY
0.5000 mL | PREFILLED_SYRINGE | INTRAMUSCULAR | Status: AC | PRN
  Administered 2020-03-13: 0.5 mL via INTRAMUSCULAR
  Filled 2020-03-13: qty 0.5

## 2020-03-13 MED ORDER — LORATADINE 10 MG PO TABS: 10.0000 mg | ORAL_TABLET | Freq: Every day | ORAL | 0 refills | Status: DC

## 2020-03-13 MED ORDER — FUROSEMIDE 40 MG PO TABS: 40.0000 mg | ORAL_TABLET | Freq: Every day | ORAL | 0 refills | Status: DC

## 2020-03-13 NOTE — Discharge Summary (Signed)
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 input(s): POCBNP CBG: Recent Labs  Lab 03/09/20 1641 03/09/20 2001 03/09/20 2310 03/10/20 0306 03/10/20 0736  GLUCAP 91 91 91 90 96   D-Dimer No results for input(s): DDIMER in the last 72 hours. Hgb A1c No results for input(s): HGBA1C in the last 72 hours. Lipid Profile No results for input(s): CHOL, HDL, LDLCALC, TRIG, CHOLHDL, LDLDIRECT in the last 72 hours. Thyroid function studies No results for input(s): TSH, T4TOTAL, T3FREE, THYROIDAB in the last 72 hours.  Invalid input(s): FREET3 Anemia work up No results for input(s): VITAMINB12, FOLATE, FERRITIN, TIBC, IRON, RETICCTPCT in the last 72 hours. Urinalysis    Component  Value Date/Time   COLORURINE YELLOW 02/09/2020 1954   APPEARANCEUR CLEAR 02/09/2020 1954   LABSPEC 1.026 02/09/2020 1954   PHURINE 6.0 02/09/2020 [place] NEGATIVE 02/09/2020 [place] NEGATIVE 02/09/2020 [place] NEGATIVE 02/09/2020 [place] NEGATIVE 02/09/2020 1954   PROTEINUR >=300 (A) 02/09/2020 1954   UROBILINOGEN 0.2 03/07/2014 0620   NITRITE NEGATIVE 02/09/2020 1954   LEUKOCYTESUR NEGATIVE 02/09/2020 1954   Sepsis Labs Invalid input(s): PROCALCITONIN,  WBC,  LACTICIDVEN Microbiology No results found for this or any previous visit (from the past 240 hour(s)).   Time coordinating discharge: Over 30 minutes  SIGNED:   Mckinley Jewel, MD  [facility] 03/13/2020, 10:20 AM Pager   If 7PM-7AM, please contact night-coverage [email or web address]
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
## 2020-03-13 NOTE — Plan of Care (Signed)
  Problem: Inadequate Intake (NI-2.1) Goal: Food and/or nutrient delivery Description: Individualized approach for food/nutrient provision. Outcome: Adequate for Discharge   Problem: Education: Goal: Knowledge of General Education information will improve Description: Including pain rating scale, medication(s)/side effects and non-pharmacologic comfort measures Outcome: Adequate for Discharge   Problem: Health Behavior/Discharge Planning: Goal: Ability to manage health-related needs will improve Outcome: Adequate for Discharge   Problem: Clinical Measurements: Goal: Ability to maintain clinical measurements within normal limits will improve Outcome: Adequate for Discharge Goal: Will remain free from infection Outcome: Adequate for Discharge Goal: Diagnostic test results will improve Outcome: Adequate for Discharge Goal: Respiratory complications will improve Outcome: Adequate for Discharge Goal: Cardiovascular complication will be avoided Outcome: Adequate for Discharge   Problem: Activity: Goal: Risk for activity intolerance will decrease Outcome: Adequate for Discharge   Problem: Nutrition: Goal: Adequate nutrition will be maintained Outcome: Adequate for Discharge   Problem: Coping: Goal: Level of anxiety will decrease Outcome: Adequate for Discharge   Problem: Elimination: Goal: Will not experience complications related to bowel motility Outcome: Adequate for Discharge Goal: Will not experience complications related to urinary retention Outcome: Adequate for Discharge   Problem: Pain Managment: Goal: General experience of comfort will improve Outcome: Adequate for Discharge   Problem: Safety: Goal: Ability to remain free from injury will improve Outcome: Adequate for Discharge   Problem: Skin Integrity: Goal: Risk for impaired skin integrity will decrease Outcome: Adequate for Discharge   Problem: Acute Rehab PT Goals(only PT should resolve) Goal: Pt Will  Go Supine/Side To Sit Outcome: Adequate for Discharge Goal: Pt Will Ambulate Outcome: Adequate for Discharge   Problem: Activity: Goal: Ability to tolerate increased activity will improve Outcome: Adequate for Discharge   Problem: Respiratory: Goal: Ability to maintain a clear airway and adequate ventilation will improve Outcome: Adequate for Discharge   Problem: Role Relationship: Goal: Method of communication will improve Outcome: Adequate for Discharge   Problem: Acute Rehab OT Goals (only OT should resolve) Goal: Pt. Will Transfer To Toilet Outcome: Adequate for Discharge Goal: OT Additional ADL Goal #1 Outcome: Adequate for Discharge   Problem: Safety: Goal: Non-violent Restraint(s) Outcome: Adequate for Discharge   Problem: Acute Rehab OT Goals (only OT should resolve) Goal: OT Additional ADL Goal #2 Outcome: Adequate for Discharge

## 2020-03-13 NOTE — TOC Transition Note (Signed)
Transition of Care [facility]) - CM/SW Discharge Note   Patient Details  Name: Shawn Ramsey MRN: [id number] Date of Birth: 1980-05-02  Transition of [facility]) CM/SW Contact:  Joanne Chars, LCSW Phone Number: 03/13/2020, 2:35 PM   Clinical Narrative:   Pt discharging home, will stay with mother.  [facility] will provide [place] services and provided phone number if there are needs over the weekend.  DME from [employer], mother reports [employer] came to the home last night and all equipment now working.  Mother asked about further trach training and RN went over this with her again.  Mother transporting home.     Final next level of care: [place] Barriers to Discharge: Barriers Resolved   Patient Goals and CMS Choice Patient states their goals for this hospitalization and ongoing recovery are:: to go home      Discharge Placement                       Discharge Plan and Services                DME Arranged: Trach supplies DME Agency: [employer] Date DME Agency Contacted: 03/12/20 Time DME Agency Contacted: [id number] Representative spoke with at DME Agency: [place]: PT,OT,RN [place] Agency: [place] ([place]) Date [place]: 03/12/20 Time [place]: [place] Representative spoke with at [place]: [place] ([place]) Interventions     Readmission Risk Interventions No flowsheet data found.

## 2020-03-16 ENCOUNTER — Telehealth: Payer: Self-pay
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
## 2020-03-16 NOTE — Telephone Encounter (Signed)
Transition Care Management Follow-up Telephone Call  Date of discharge and from where: 03/13/20 from [place]  How have you been since you were released from the hospital? okay  Any questions or concerns? Yes, not able to sleep and wants to go back on torsemide because Lasix not working  Items Reviewed:  Did the pt receive and understand the discharge instructions provided? Yes   Medications obtained and verified? Yes   Other? No   Any new allergies since your discharge? No   Dietary orders reviewed? Yes  Do you have support at home? Yes   Home Care and Equipment/Supplies: Were home health services ordered? yes If so, what is the name of the agency? [facility]  Has the agency set up a time to come to the patient's home? no Were any new equipment or medical supplies ordered?  no What is the name of the medical supply agency? na Were you able to get the supplies/equipment?na not applicable Do you have any questions related to the use of the equipment or supplies? No  Functional Questionnaire: (I = Independent and D = Dependent) ADLs: I  Bathing/Dressing- i  Meal Prep- d  Eating- i  Maintaining continence- i  Transferring/Ambulation- i  Managing Meds- d  Follow up appointments reviewed:   PCP Hospital f/u appt confirmed? Yes  Scheduled to see dr simpson on 1/27 @ 920.  [place] [facility] f/u appt confirmed? No  Scheduled to see  Are transportation arrangements needed? No   If their condition worsens, is the pt aware to call PCP or go to the Emergency Dept.? Yes  Was the patient provided with contact information for the PCP's office or ED? Yes  Was to pt encouraged to call back with questions or concerns? Yes

## 2020-03-16 NOTE — Telephone Encounter (Signed)
Did TOC call and pt has some concerns- 1. He needs a piece for the trach to be able to use his oxygen. 2. The lasix doesn't work for him and he wants to go back on the torsemide instead and he has more left. Wants to know if that's ok. 3. He hasn't been able to sleep at night at all. Wants something to help him sleep. I made him aware that these may have to be addressed during the phone visit.

## 2020-03-16 NOTE — Telephone Encounter (Signed)
Med management is at visit, piece for the trach? Not sure exactly what , but please try to assist with that asap

## 2020-03-17 ENCOUNTER — Encounter: Payer: Self-pay | Admitting: Psychiatry

## 2020-03-17 ENCOUNTER — Telehealth (HOSPITAL_COMMUNITY): Payer: Medicaid Other | Admitting: Psychiatry

## 2020-03-17 ENCOUNTER — Telehealth (INDEPENDENT_AMBULATORY_CARE_PROVIDER_SITE_OTHER): Payer: Medicaid Other | Admitting: Psychiatry

## 2020-03-17 ENCOUNTER — Other Ambulatory Visit: Payer: Self-pay

## 2020-03-17 DIAGNOSIS — F79 Unspecified intellectual disabilities: Secondary | ICD-10-CM | POA: Diagnosis not present

## 2020-03-17 DIAGNOSIS — F330 Major depressive disorder, recurrent, mild: Secondary | ICD-10-CM | POA: Diagnosis not present

## 2020-03-17 MED ORDER — SERTRALINE HCL 25 MG PO TABS: 25.0000 mg | ORAL_TABLET | Freq: Every day | ORAL | 1 refills | Status: DC

## 2020-03-19 ENCOUNTER — Other Ambulatory Visit: Payer: Self-pay

## 2020-03-19 ENCOUNTER — Ambulatory Visit (INDEPENDENT_AMBULATORY_CARE_PROVIDER_SITE_OTHER): Payer: Medicaid Other | Admitting: Family Medicine

## 2020-03-19 ENCOUNTER — Encounter: Payer: Self-pay | Admitting: Family Medicine

## 2020-03-19 VITALS — BP 104/80 | Ht 65.0 in | Wt 340.0 lb

## 2020-03-19 DIAGNOSIS — Z6841 Body Mass Index (BMI) 40.0 and over, adult: Secondary | ICD-10-CM

## 2020-03-19 DIAGNOSIS — E785 Hyperlipidemia, unspecified: Secondary | ICD-10-CM

## 2020-03-19 DIAGNOSIS — Z1159 Encounter for screening for other viral diseases: Secondary | ICD-10-CM

## 2020-03-19 DIAGNOSIS — Z7689 Persons encountering health services in other specified circumstances: Secondary | ICD-10-CM

## 2020-03-19 DIAGNOSIS — I10 Essential (primary) hypertension: Secondary | ICD-10-CM

## 2020-03-19 DIAGNOSIS — E1169 Type 2 diabetes mellitus with other specified complication: Secondary | ICD-10-CM

## 2020-03-19 DIAGNOSIS — M79605 Pain in left leg: Secondary | ICD-10-CM

## 2020-03-19 DIAGNOSIS — E559 Vitamin D deficiency, unspecified: Secondary | ICD-10-CM

## 2020-03-19 DIAGNOSIS — R600 Localized edema: Secondary | ICD-10-CM

## 2020-03-19 DIAGNOSIS — E669 Obesity, unspecified: Secondary | ICD-10-CM

## 2020-03-19 MED ORDER — TEMAZEPAM 7.5 MG PO CAPS: 7.5000 mg | ORAL_CAPSULE | Freq: Every evening | ORAL | 2 refills | Status: DC | PRN

## 2020-03-19 MED ORDER — TORSEMIDE 20 MG PO TABS: 20.0000 mg | ORAL_TABLET | Freq: Every day | ORAL | 2 refills | Status: DC

## 2020-03-19 NOTE — Telephone Encounter (Signed)
Advised pt to speak with home health and see if he did need something else for his oxygen and if they could not provide it then they could contact us
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
## 2020-03-23 ENCOUNTER — Telehealth: Payer: Self-pay | Admitting: Family Medicine

## 2020-03-23 ENCOUNTER — Encounter: Payer: Self-pay | Admitting: Family Medicine

## 2020-03-23 DIAGNOSIS — Z7689 Persons encountering health services in other specified circumstances: Secondary | ICD-10-CM | POA: Insufficient documentation

## 2020-03-23 DIAGNOSIS — Z1211 Encounter for screening for malignant neoplasm of colon: Secondary | ICD-10-CM | POA: Insufficient documentation

## 2020-03-23 DIAGNOSIS — R600 Localized edema: Secondary | ICD-10-CM | POA: Insufficient documentation

## 2020-03-23 MED ORDER — BUDESONIDE 0.25 MG/2ML IN SUSP: 0.2500 mg | Freq: Two times a day (BID) | RESPIRATORY_TRACT | 12 refills | Status: DC
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
## 2020-03-23 NOTE — Assessment & Plan Note (Signed)
Reports less diuresis with lasix, changed to demadex

## 2020-03-23 NOTE — Assessment & Plan Note (Signed)
  Patient re-educated about  the importance of commitment to a  minimum of 150 minutes of exercise per week as able.  The importance of healthy food choices with portion control discussed, as well as eating regularly and within a 12 hour window most days. The need to choose "clean , green" food 50 to 75% of the time is discussed, as well as to make water the primary drink and set a goal of 64 ounces water daily.    Weight /BMI 03/19/2020 03/13/2020 02/10/2020  WEIGHT 340 lb 341 lb 14.9 oz -  HEIGHT 5\' 5"  - 5\' 5"   BMI 56.58 kg/m2 - 56.9 kg/m2  Some encounter information is confidential and restricted. Go to Review Flowsheets activity to see all data.

## 2020-03-23 NOTE — Telephone Encounter (Signed)
Pharmacy submitted budesonide rx as generic, [place] [employer] only reimburses brand name.  Called pharmacy, who resubmitted correctly.  Encounter opened in error.

## 2020-03-23 NOTE — Assessment & Plan Note (Signed)
Patient in for follow up of recent hospitalization. Discharge summary, and laboratory and radiology data are reviewed, and any questions or concerns  are discussed. Specific issues requiring follow up are specifically addressed.  
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
## 2020-03-30 ENCOUNTER — Other Ambulatory Visit (HOSPITAL_COMMUNITY): Payer: Self-pay | Admitting: Radiology

## 2020-03-31 ENCOUNTER — Telehealth: Payer: Self-pay | Admitting: Pulmonary Disease

## 2020-03-31 NOTE — Telephone Encounter (Signed)
Attempted to call pt's mother [name] but unable to reach. Left message for her to return call.

## 2020-03-31 NOTE — Telephone Encounter (Signed)
Left message for patient's mother to call back. I looked in his chart, I don't see that we have ever ordered supplies for patient. Patient has an appt with the trach clinic on 2/16.

## 2020-04-06 ENCOUNTER — Other Ambulatory Visit: Payer: Self-pay

## 2020-04-06 ENCOUNTER — Other Ambulatory Visit (HOSPITAL_COMMUNITY)
Admission: RE | Admit: 2020-04-06 | Discharge: 2020-04-06 | Disposition: A | Discharge status: Home / Self Care | Payer: Medicaid Other | Source: Ambulatory Visit | Attending: Pulmonary Disease | Admitting: Pulmonary Disease

## 2020-04-06 DIAGNOSIS — Z20822 Contact with and (suspected) exposure to covid-19: Secondary | ICD-10-CM | POA: Insufficient documentation

## 2020-04-06 DIAGNOSIS — Z01812 Encounter for preprocedural laboratory examination: Secondary | ICD-10-CM | POA: Diagnosis present

## 2020-04-06 NOTE — Telephone Encounter (Signed)
Called and spoke with pt's mother Shawn Ramsey in regards to order request for trach supplies. Stated to her that I did not see where we had ever ordered supplies for pt's trach. Shawn Ramsey stated that she was told that supplies could either be ordered by PCP or by pulmonary office.  Pt has an appt with trach clinic Wed. 2/16 but Shawn Ramsey stated that pt was needing to get supplies ordered before then.  Dr. Halford Chessman, please advise if you are okay with [place] placing order for pt to receive necessary supplies.

## 2020-04-07 LAB — SARS CORONAVIRUS 2 (TAT 6-24 HRS): SARS Coronavirus 2: NEGATIVE

## 2020-04-08 ENCOUNTER — Telehealth: Payer: Self-pay

## 2020-04-08 ENCOUNTER — Ambulatory Visit (HOSPITAL_COMMUNITY)
Admission: RE | Admit: 2020-04-08 | Discharge: 2020-04-08 | Disposition: A | Discharge status: Home / Self Care | Payer: Medicaid Other | Source: Ambulatory Visit | Attending: Acute Care | Admitting: Acute Care

## 2020-04-08 ENCOUNTER — Other Ambulatory Visit: Payer: Self-pay

## 2020-04-08 DIAGNOSIS — G4733 Obstructive sleep apnea (adult) (pediatric): Secondary | ICD-10-CM

## 2020-04-08 DIAGNOSIS — E669 Obesity, unspecified: Secondary | ICD-10-CM | POA: Insufficient documentation

## 2020-04-08 DIAGNOSIS — Z993 Dependence on wheelchair: Secondary | ICD-10-CM | POA: Insufficient documentation

## 2020-04-08 DIAGNOSIS — Z789 Other specified health status: Secondary | ICD-10-CM

## 2020-04-08 DIAGNOSIS — Z7182 Exercise counseling: Secondary | ICD-10-CM | POA: Insufficient documentation

## 2020-04-08 DIAGNOSIS — Z930 Tracheostomy status: Secondary | ICD-10-CM

## 2020-04-08 DIAGNOSIS — J9611 Chronic respiratory failure with hypoxia: Secondary | ICD-10-CM

## 2020-04-08 DIAGNOSIS — J9612 Chronic respiratory failure with hypercapnia: Secondary | ICD-10-CM

## 2020-04-08 DIAGNOSIS — E662 Morbid (severe) obesity with alveolar hypoventilation: Secondary | ICD-10-CM | POA: Insufficient documentation

## 2020-04-08 DIAGNOSIS — Z430 Encounter for attention to tracheostomy: Secondary | ICD-10-CM | POA: Insufficient documentation

## 2020-04-08 NOTE — Telephone Encounter (Signed)
ATC patient's mother [name] per DPR in regards to trach supplies. Advised that patient has an appointment today at 2pm at the trach clinic and that they should be able to get all supplies and everything they need at that time. Advised if they needed anything else or any questions to please call after their appointment. Nothing further needed at this time.

## 2020-04-08 NOTE — Telephone Encounter (Signed)
He had tracheostomy placed during his hospital stay earlier this year.  He has appointment in trach clinic today and should be able to get supplies through trach clinic.

## 2020-04-08 NOTE — Telephone Encounter (Signed)
-----   Message from Erick Colace, NP sent at 04/08/2020 11:05 AM EST ----- Hey guys  Need to request the following: 30d supply 14 french sxn cath Trach care kits # 30 (get 60 of they will allow) Trach dressings # 30  Refills PRN  Laurey Arrow

## 2020-04-08 NOTE — Progress Notes (Addendum)
Tracheostomy Procedure Note  Shawn Ramsey [id number] 04-Jun-1980  Pre Procedure Tracheostomy Information  Trach Brand: Shiley Size: 8.0 XLTUP Style: Uncuffed Secured by: Velcro   Procedure: Trach cleaning and trach changed today    Post Procedure Tracheostomy Information  Trach Brand: Shiley Size: 8.0 XLT UP Style: Uncuffed Secured by: Velcro   Post Procedure Evaluation:  ETCO2 positive color change from yellow to purple : Yes.   Vital signs:VSS Patients current condition: stable Complications: No apparent complications Trach site exam: clean Wound care done: dry Patient did tolerate procedure well.   Education: Family and patient educated on trach cleaning and trach care  Prescription needs: Order to [employer] for supplies needed at home done thru trach clinic practioner    Additional needs: New PMV changed  Today Patient also given a box of inner cannulas  80XLTIN

## 2020-04-08 NOTE — Telephone Encounter (Signed)
Order has been placed.

## 2020-04-09 ENCOUNTER — Telehealth: Payer: Self-pay

## 2020-04-09 DIAGNOSIS — Z789 Other specified health status: Secondary | ICD-10-CM | POA: Insufficient documentation

## 2020-04-09 DIAGNOSIS — E662 Morbid (severe) obesity with alveolar hypoventilation: Secondary | ICD-10-CM | POA: Insufficient documentation

## 2020-04-09 DIAGNOSIS — Z930 Tracheostomy status: Secondary | ICD-10-CM

## 2020-04-09 NOTE — Telephone Encounter (Signed)
Order has been placed.

## 2020-04-09 NOTE — Telephone Encounter (Signed)
-----   Message from Erick Colace, NP sent at 04/08/2020  3:12 PM EST ----- Regarding: one more dme request Sorry guys  Also need to order  1 box of size 8 inner cannulas  Ref [id number]  Refill as needed.

## 2020-04-09 NOTE — Progress Notes (Signed)
Reason for visit  Tracheostomy management and care  HPI This is a 41 year old male known to me from his recent hospitalization. Presents today to trach clinic for planned tracheostomy change and to get established for care. Since his discharge to home he is doing well. He lives with his mother. He has lost about 40 lbs and his day-time somnolence has improved greatly.   ROS HENT: no HA, sinus congestion, throat pain, neck pain, trach site drainage or concern. Pulm no increase in SOB, no sig cough, no wheezing, no CP. Card no CP, no orthopnea. GI no issues. GU no issues, neuro, no issues. General no issues  Exam Saturations 90s on room air  General obese 40 year old male sitting in wheel chair. He is awake and alert. He is in no distress HENT NCAT his neck is large. Has size 8 cuffless proximal XLT trach in place. He can speak w/ PMV but voice quality somewhat reduced. He has some granulation tissue surrounding the trach stoma and trach stoma is well matured but opens and closes with inspiratory/expariatoy cycle.  Card rrr Pulm dec t/o no accessory use Ext dependent edema abd large not tender Neuro intact  Procedure  The 8 prox xlt was removed. A new 8 proximal xlt replaced. 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 I am happy to see he has lost some weight but still has a long way to go. He asked several times during our visit about when his trach could be removed. I told him that we need to see him get at least 100 lbs off before this could even be considered. I spent several minutes discussing dietary intake with he and his mother. He was fairly disinterested in this and kept asking for "a pill" or something he could take to help him lose the  weight.  I did take this opportunity to help his mom understand basic trach care and we reviewed all aspects of trach care during our visit Of not Ladarious has significant rush of air when his PMV is removed. This suggests stlll some evidence of airway obstruction with the PMV in place ? He certainly could have some tracheal stenosis but I would be more inclined to bet he has some tracheal [place]  Plan Cont routine trach care I will see him every 8 weeks for now I encouraged him to download a fitness app for calorie counting on his smart phone so that he could work on active weight loss and gain insight on his diet   My time was 45 minutes   Erick Colace ACNP-BC [place] Pager # [phone] OR # [phone] if no answer

## 2020-04-16 ENCOUNTER — Telehealth: Payer: Self-pay | Admitting: Pulmonary Disease

## 2020-04-16 ENCOUNTER — Institutional Professional Consult (permissible substitution): Payer: Medicaid Other | Admitting: Plastic Surgery

## 2020-04-16 NOTE — Telephone Encounter (Signed)
Call returned to patient, confirmed DOB. Patient reports he was returning a phone call. I made patient aware I did not see any recent documentation of anyone trying to contact him. I inquired if he needed trach supplies. He denies needing supplies. Made him aware if someone needed him the would call him back. Voiced understanding.   Nothing further needed at this time.

## 2020-04-20 ENCOUNTER — Inpatient Hospital Stay: Payer: Medicaid Other | Admitting: Pulmonary Disease

## 2020-04-28 ENCOUNTER — Other Ambulatory Visit: Payer: Self-pay

## 2020-04-28 ENCOUNTER — Telehealth: Payer: Medicaid Other | Admitting: Family Medicine

## 2020-04-30 ENCOUNTER — Telehealth: Payer: Medicaid Other | Admitting: Family Medicine

## 2020-05-06 ENCOUNTER — Other Ambulatory Visit: Payer: Self-pay | Admitting: Family Medicine
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
## 2020-05-12 ENCOUNTER — Other Ambulatory Visit: Payer: Self-pay

## 2020-05-12 ENCOUNTER — Telehealth: Payer: Self-pay | Admitting: Psychiatry

## 2020-05-12 ENCOUNTER — Telehealth: Payer: Medicaid Other | Admitting: Psychiatry

## 2020-05-12 ENCOUNTER — Telehealth: Payer: Self-pay

## 2020-05-12 NOTE — Telephone Encounter (Signed)
Called the patient for appointment scheduled today. The patient did not answer the phone. Contacted his mother, [name]. She thinks that he is asleep, and he would not be able to attend the visit. [name] verbalized understanding that this would be counted as no show. She agrees to reschedule the appointment. Noted that she will not be able to bring him for in person visit as the office is far from their home.

## 2020-05-12 NOTE — Telephone Encounter (Signed)
Patients mother called she states [facility] needs authorization saying he needs to keep the oxygen tank ph# [phone]

## 2020-05-12 NOTE — Telephone Encounter (Signed)
He will need an appointment with an available provider to complete this. Form in brown folder

## 2020-05-12 NOTE — Telephone Encounter (Signed)
Appointment scheduled.

## 2020-05-14 ENCOUNTER — Telehealth: Payer: Self-pay | Admitting: Pulmonary Disease

## 2020-05-14 NOTE — Telephone Encounter (Signed)
Spoke with the pt's mother  She states that [employer] told her that they are needing authorization for the pt to continue o2  I assume that she means CMN- Vallarie Mare, do you have one on him? Thanks!

## 2020-05-14 NOTE — Telephone Encounter (Signed)
No I have not received a CMN on this patient

## 2020-05-14 NOTE — Telephone Encounter (Signed)
OK- I have sent community msg to [employer] to see what is needed. Will await a response.

## 2020-05-15 NOTE — Telephone Encounter (Signed)
Shawn Ramsey, Shawn Ramsey; Shawn Ramsey, Shawn Ramsey; Shawn Ramsey   Hello,   This patient has been on O2 service with [employer] since 11/2018, nothing further is needed at this time.

## 2020-05-18 ENCOUNTER — Encounter: Payer: Self-pay | Admitting: Nurse Practitioner

## 2020-05-18 ENCOUNTER — Ambulatory Visit (INDEPENDENT_AMBULATORY_CARE_PROVIDER_SITE_OTHER): Payer: Medicaid Other | Admitting: Nurse Practitioner

## 2020-05-18 ENCOUNTER — Other Ambulatory Visit: Payer: Self-pay

## 2020-05-18 VITALS — BP 160/90 | HR 102 | Temp 98.3°F | Resp 22 | Ht 65.0 in | Wt 326.0 lb

## 2020-05-18 DIAGNOSIS — E1169 Type 2 diabetes mellitus with other specified complication: Secondary | ICD-10-CM

## 2020-05-18 DIAGNOSIS — G4733 Obstructive sleep apnea (adult) (pediatric): Secondary | ICD-10-CM

## 2020-05-18 DIAGNOSIS — I10 Essential (primary) hypertension: Secondary | ICD-10-CM | POA: Diagnosis not present

## 2020-05-18 DIAGNOSIS — E785 Hyperlipidemia, unspecified: Secondary | ICD-10-CM

## 2020-05-18 DIAGNOSIS — E669 Obesity, unspecified: Secondary | ICD-10-CM
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
## 2020-05-18 NOTE — Assessment & Plan Note (Signed)
-  uses BiPAP through his trach at night; uses nocturnal O2 -completed forms to best of my ability today, but would be more appropriate for pulmonology to complete these -we discussed that he should take these to pulmonology when he needs them completed again

## 2020-05-18 NOTE — Patient Instructions (Signed)
For oxygen documentation, please have future documents completed by pulmonology as lung function is their specialty.  Please have fasting labs drawn 2-3 days prior to your appointment so we can discuss the results during your office visit.

## 2020-05-21 ENCOUNTER — Other Ambulatory Visit: Payer: Self-pay

## 2020-05-21 ENCOUNTER — Telehealth (INDEPENDENT_AMBULATORY_CARE_PROVIDER_SITE_OTHER): Payer: Medicaid Other | Admitting: Psychiatry

## 2020-05-21 ENCOUNTER — Encounter: Payer: Self-pay | Admitting: Psychiatry

## 2020-05-21 DIAGNOSIS — F79 Unspecified intellectual disabilities: Secondary | ICD-10-CM

## 2020-05-21 DIAGNOSIS — F3341 Major depressive disorder, recurrent, in partial remission: Secondary | ICD-10-CM

## 2020-05-21 MED ORDER — SERTRALINE HCL 25 MG PO TABS: 25.0000 mg | ORAL_TABLET | Freq: Every day | ORAL | 2 refills | Status: DC
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
## 2020-06-06 ENCOUNTER — Other Ambulatory Visit: Payer: Self-pay | Admitting: Family Medicine

## 2020-06-13 ENCOUNTER — Emergency Department (HOSPITAL_COMMUNITY): Payer: Medicaid Other

## 2020-06-13 ENCOUNTER — Other Ambulatory Visit: Payer: Self-pay

## 2020-06-13 ENCOUNTER — Encounter (HOSPITAL_COMMUNITY): Payer: Self-pay | Admitting: Emergency Medicine

## 2020-06-13 ENCOUNTER — Emergency Department (HOSPITAL_COMMUNITY)
Admission: EM | Admit: 2020-06-13 | Discharge: 2020-06-13 | Disposition: A | Discharge status: Home / Self Care | Payer: Medicaid Other | Attending: Emergency Medicine | Admitting: Emergency Medicine

## 2020-06-13 DIAGNOSIS — E1169 Type 2 diabetes mellitus with other specified complication: Secondary | ICD-10-CM | POA: Insufficient documentation

## 2020-06-13 DIAGNOSIS — Z7984 Long term (current) use of oral hypoglycemic drugs: Secondary | ICD-10-CM | POA: Insufficient documentation

## 2020-06-13 DIAGNOSIS — E785 Hyperlipidemia, unspecified: Secondary | ICD-10-CM | POA: Insufficient documentation

## 2020-06-13 DIAGNOSIS — R0600 Dyspnea, unspecified: Secondary | ICD-10-CM

## 2020-06-13 DIAGNOSIS — Z87891 Personal history of nicotine dependence: Secondary | ICD-10-CM | POA: Diagnosis not present

## 2020-06-13 DIAGNOSIS — R0602 Shortness of breath: Secondary | ICD-10-CM | POA: Insufficient documentation

## 2020-06-13 DIAGNOSIS — Z8616 Personal history of COVID-19: Secondary | ICD-10-CM | POA: Diagnosis not present

## 2020-06-13 DIAGNOSIS — R059 Cough, unspecified: Secondary | ICD-10-CM | POA: Diagnosis not present

## 2020-06-13 DIAGNOSIS — Z79899 Other long term (current) drug therapy: Secondary | ICD-10-CM | POA: Diagnosis not present

## 2020-06-13 DIAGNOSIS — J449 Chronic obstructive pulmonary disease, unspecified: Secondary | ICD-10-CM | POA: Insufficient documentation

## 2020-06-13 DIAGNOSIS — J45909 Unspecified asthma, uncomplicated: Secondary | ICD-10-CM | POA: Diagnosis not present

## 2020-06-13 LAB — CBC WITH DIFFERENTIAL/PLATELET
Abs Immature Granulocytes: 0.01 10*3/uL (ref 0.00–0.07)
Basophils Absolute: 0 10*3/uL (ref 0.0–0.1)
Basophils Relative: 0 %
Eosinophils Absolute: 0.1 10*3/uL (ref 0.0–0.5)
Eosinophils Relative: 1 %
HCT: 38.3 % — ABNORMAL LOW (ref 39.0–52.0)
Hemoglobin: 12.2 g/dL — ABNORMAL LOW (ref 13.0–17.0)
Immature Granulocytes: 0 %
Lymphocytes Relative: 35 %
Lymphs Abs: 2.5 10*3/uL (ref 0.7–4.0)
MCH: 27.4 pg (ref 26.0–34.0)
MCHC: 31.9 g/dL (ref 30.0–36.0)
MCV: 85.9 fL (ref 80.0–100.0)
Monocytes Absolute: 0.4 10*3/uL (ref 0.1–1.0)
Monocytes Relative: 6 %
Neutro Abs: 4.1 10*3/uL (ref 1.7–7.7)
Neutrophils Relative %: 58 %
Platelets: 355 10*3/uL (ref 150–400)
RBC: 4.46 MIL/uL (ref 4.22–5.81)
RDW: 16 % — ABNORMAL HIGH (ref 11.5–15.5)
WBC: 7.2 10*3/uL (ref 4.0–10.5)
nRBC: 0 % (ref 0.0–0.2)

## 2020-06-13 LAB — BASIC METABOLIC PANEL
Anion gap: 12 (ref 5–15)
BUN: 17 mg/dL (ref 6–20)
CO2: 28 mmol/L (ref 22–32)
Calcium: 8.6 mg/dL — ABNORMAL LOW (ref 8.9–10.3)
Chloride: 98 mmol/L (ref 98–111)
Creatinine, Ser: 0.89 mg/dL (ref 0.61–1.24)
GFR, Estimated: >60 mL/min (ref >60)
Glucose, Bld: 137 mg/dL — ABNORMAL HIGH (ref 70–99)
Potassium: 3.1 mmol/L — ABNORMAL LOW (ref 3.5–5.1)
Sodium: 138 mmol/L (ref 135–145)

## 2020-06-13 LAB — D-DIMER, QUANTITATIVE: D-Dimer, Quant: 0.72 ug/mL-FEU — ABNORMAL HIGH (ref 0.00–0.50)

## 2020-06-13 LAB — BRAIN NATRIURETIC PEPTIDE: B Natriuretic Peptide: 29 pg/mL (ref 0.0–100.0)

## 2020-06-13 LAB — TROPONIN I (HIGH SENSITIVITY)
Troponin I (High Sensitivity): 13 ng/L (ref <18)
Troponin I (High Sensitivity): 14 ng/L (ref <18)

## 2020-06-13 MED ORDER — POTASSIUM CHLORIDE CRYS ER 20 MEQ PO TBCR
40.0000 meq | EXTENDED_RELEASE_TABLET | Freq: Once | ORAL | Status: AC
  Administered 2020-06-13: 40 meq via ORAL
  Filled 2020-06-13: qty 2

## 2020-06-13 MED ORDER — IOHEXOL 350 MG/ML SOLN
100.0000 mL | Freq: Once | INTRAVENOUS | Status: AC | PRN
  Administered 2020-06-13: 100 mL via INTRAVENOUS

## 2020-06-13 MED ORDER — IPRATROPIUM-ALBUTEROL 0.5-2.5 (3) MG/3ML IN SOLN
3.0000 mL | Freq: Once | RESPIRATORY_TRACT | Status: AC
  Administered 2020-06-13: 3 mL via RESPIRATORY_TRACT

## 2020-06-13 NOTE — ED Notes (Signed)
Pt ambulated to BR, O2 97% while ambulating

## 2020-06-13 NOTE — Discharge Instructions (Signed)
Your testing is reassuring.  No evidence of heart attack, pneumonia, heart failure or blood clot in the lung.  Use your inhalers as prescribed and follow-up with your doctor.  Your CT scan did show a lesion in your liver which may be a hemangioma and you should have an MRI scheduled by Dr. [name] regarding this. Return to the ED if you develop new or worsening symptoms
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
## 2020-06-15 ENCOUNTER — Other Ambulatory Visit: Payer: Self-pay

## 2020-06-15 ENCOUNTER — Other Ambulatory Visit (HOSPITAL_COMMUNITY)
Admission: RE | Admit: 2020-06-15 | Discharge: 2020-06-15 | Disposition: A | Discharge status: Home / Self Care | Payer: Medicaid Other | Source: Ambulatory Visit | Attending: Internal Medicine | Admitting: Internal Medicine

## 2020-06-15 ENCOUNTER — Other Ambulatory Visit (HOSPITAL_COMMUNITY): Payer: Medicaid Other

## 2020-06-15 DIAGNOSIS — Z20822 Contact with and (suspected) exposure to covid-19: Secondary | ICD-10-CM | POA: Insufficient documentation

## 2020-06-15 DIAGNOSIS — Z01812 Encounter for preprocedural laboratory examination: Secondary | ICD-10-CM | POA: Diagnosis not present

## 2020-06-15 LAB — SARS CORONAVIRUS 2 (TAT 6-24 HRS): SARS Coronavirus 2: NEGATIVE

## 2020-06-17 ENCOUNTER — Other Ambulatory Visit: Payer: Self-pay

## 2020-06-17 ENCOUNTER — Ambulatory Visit (HOSPITAL_COMMUNITY)
Admission: RE | Admit: 2020-06-17 | Discharge: 2020-06-17 | Disposition: A | Discharge status: Home / Self Care | Payer: Medicaid Other | Source: Ambulatory Visit | Attending: Acute Care | Admitting: Acute Care

## 2020-06-17 DIAGNOSIS — Z430 Encounter for attention to tracheostomy: Secondary | ICD-10-CM | POA: Insufficient documentation

## 2020-06-17 DIAGNOSIS — J9612 Chronic respiratory failure with hypercapnia: Secondary | ICD-10-CM | POA: Insufficient documentation

## 2020-06-17 DIAGNOSIS — J9611 Chronic respiratory failure with hypoxia: Secondary | ICD-10-CM | POA: Insufficient documentation

## 2020-06-17 DIAGNOSIS — Z4682 Encounter for fitting and adjustment of non-vascular catheter: Secondary | ICD-10-CM | POA: Diagnosis not present

## 2020-06-17 DIAGNOSIS — Z930 Tracheostomy status: Secondary | ICD-10-CM | POA: Diagnosis not present

## 2020-06-17 DIAGNOSIS — E662 Morbid (severe) obesity with alveolar hypoventilation: Secondary | ICD-10-CM | POA: Insufficient documentation

## 2020-06-17 NOTE — Progress Notes (Signed)
Reason for visit  Tracheostomy change  HPI  40 year old male patient well-known to me from tracheostomy clinic I follow him for tracheostomy management in the setting of severe OHS and OSA.  He presents today for planned tracheostomy change accompanied again by his mother.  He continues to make improvement from a general health standpoint he is lost a total of about 40 pounds.  He is able to ambulate now, tolerate more activity.  He is eager for an evaluation for gastric bypass however I am not sure he understands how much of an endeavor this would be   Review of systems  Review of Systems  Constitutional: Negative.   HENT: Negative.   Eyes: Negative.   Respiratory: Negative.   Cardiovascular: Negative.   Gastrointestinal: Negative.   Genitourinary: Negative.   Musculoskeletal: Negative.   Skin: Negative.   Neurological: Negative.   Endo/Heme/Allergies: Negative.   Psychiatric/Behavioral: Negative.    Physical exam    General assessment obese Presents today 40 year old male patient for planned tracheostomy change she is in no acute distress HEENT normocephalic atraumatic speech remains somewhat muffled but able to understand what he saying with the help of his mother this is his baseline.  He continues to have a size 6 proximal XLT cuffless tracheostomy in place he does continue to exhibit small rush of air when PMV is removed also has exertional dyspnea more so if PMV kept in place during activity Pulmonary: Clear to auscultation diminished in the bases Cardiac: Regular rate and rhythm Abdomen: Soft nontender no organomegaly Extremities: Dependent edema Neuro: Awake oriented no focal deficits.  Procedure  Tracheostomy change.  The existing size 6 proximal XLT tracheostomy was removed.  The tracheostomy site was inspected there was no observed areas of skin breakdown, the site was dry and intact a size 6 proximal XLT was reinserted without difficulty end-tidal CO2 confirmed  placement  Impression/plan  Chronic hypoxic and hypercarbic respiratory failure Obesity hypoventilation syndrome Tracheostomy dependence Morbid obesity  Discussion Continuing to slowly improve.  He is lost weight, and his activity tolerance is improving as well.  Last time I saw him we had a long discussion about weight loss, calorie counting, strategies for weight loss in general which she was fairly disinterested in.  AcNot convinced he has the healthcare literacy to do much for himself other than typical day-to-day activity.  I doubt he will be a candidate for decannulation however I guess with further weight loss this could be considered   Plan Continue routine trach care Return for tracheostomy change in 12 weeks  My time 20 minutes of which > 70% spent direct face to face council   Erick Colace ACNP-BC [place] Pager # [phone] OR # [phone] if no answer

## 2020-06-17 NOTE — Progress Notes (Signed)
Tracheostomy Procedure Note  Shawn Ramsey [id number] 1980/04/20  Pre Procedure Tracheostomy Information  Trach Brand: Shiley Size: 8.0 XLTUP Style: Uncuffed Secured by: Velcro   Procedure:Trach change and trach cleaning    Post Procedure Tracheostomy Information  Trach Brand: Shiley Size: 8.0 XLTUP Style: Uncuffed Secured by: Velcro   Post Procedure Evaluation:  ETCO2 positive color change from yellow to purple : Yes.   Vital signs:VSS Patients current condition: stable Complications: No apparent complications Trach site exam: dry and clean Wound care done: dry 4x4 wound gauze Patient did tolerate procedure well.   Education: Nutrition education  Prescription needs:none    Additional needs: New PMV valve given to patient at this visit

## 2020-06-26 ENCOUNTER — Telehealth: Payer: Self-pay | Admitting: Pulmonary Disease

## 2020-06-26 NOTE — Telephone Encounter (Signed)
Pt's mother, [name] per DPR states the pt has a sore throat which started yesterday (06/25/2020). [name] stated patient went to [facility] ED on 06/24/2020 for trouble breathing where got a breathing treatment and was put on Zithromax, Prednisone, Hycodan and guaifenesin which patient is still currently taking for the cough and congestion. Patient is doing his neb treatments too.[name] denies patient has had any fevers or shortness of breath currently. [name] states patient would like something for the sore throat.   She would like something called into the [facility] in [place].   Dr Halford Chessman please advise.

## 2020-06-26 NOTE — Telephone Encounter (Signed)
He can get chloraseptic throat spray and use as needed for sore throat.

## 2020-06-26 NOTE — Telephone Encounter (Signed)
Called and spoke with pt's mother [name] letting her know the info stated by VS and she verbalized understanding. Nothing further needed.

## 2020-06-30 ENCOUNTER — Other Ambulatory Visit: Payer: Self-pay | Admitting: Family Medicine

## 2020-07-06 ENCOUNTER — Other Ambulatory Visit: Payer: Self-pay | Admitting: *Deleted

## 2020-07-06 ENCOUNTER — Telehealth: Payer: Self-pay

## 2020-07-06 MED ORDER — TORSEMIDE 20 MG PO TABS: 20.0000 mg | ORAL_TABLET | Freq: Every day | ORAL | 0 refills | Status: DC

## 2020-07-06 MED ORDER — METFORMIN HCL ER 500 MG PO TB24: 500.0000 mg | ORAL_TABLET | Freq: Every day | ORAL | 0 refills | Status: DC

## 2020-07-06 MED ORDER — ALBUTEROL SULFATE (2.5 MG/3ML) 0.083% IN NEBU: 2.5000 mg | INHALATION_SOLUTION | Freq: Four times a day (QID) | RESPIRATORY_TRACT | 12 refills | Status: DC | PRN

## 2020-07-06 NOTE — Telephone Encounter (Signed)
Albuteral , Torsemide, and Metformin, please call in

## 2020-07-06 NOTE — Telephone Encounter (Signed)
Medication sent to pharmacy  

## 2020-07-13 ENCOUNTER — Ambulatory Visit: Payer: Medicaid Other | Admitting: Family Medicine

## 2020-07-15 ENCOUNTER — Other Ambulatory Visit: Payer: Self-pay

## 2020-07-15 ENCOUNTER — Ambulatory Visit (INDEPENDENT_AMBULATORY_CARE_PROVIDER_SITE_OTHER): Payer: Medicaid Other | Admitting: Family Medicine

## 2020-07-15 VITALS — BP 131/77 | Ht 65.0 in | Wt 316.0 lb

## 2020-07-15 DIAGNOSIS — I5032 Chronic diastolic (congestive) heart failure: Secondary | ICD-10-CM

## 2020-07-15 DIAGNOSIS — E559 Vitamin D deficiency, unspecified: Secondary | ICD-10-CM

## 2020-07-15 DIAGNOSIS — Z930 Tracheostomy status: Secondary | ICD-10-CM

## 2020-07-15 DIAGNOSIS — G4733 Obstructive sleep apnea (adult) (pediatric): Secondary | ICD-10-CM

## 2020-07-15 DIAGNOSIS — K769 Liver disease, unspecified: Secondary | ICD-10-CM | POA: Diagnosis not present

## 2020-07-15 DIAGNOSIS — E1169 Type 2 diabetes mellitus with other specified complication: Secondary | ICD-10-CM

## 2020-07-15 DIAGNOSIS — Z6841 Body Mass Index (BMI) 40.0 and over, adult: Secondary | ICD-10-CM

## 2020-07-15 DIAGNOSIS — E669 Obesity, unspecified: Secondary | ICD-10-CM

## 2020-07-15 DIAGNOSIS — I10 Essential (primary) hypertension: Secondary | ICD-10-CM | POA: Diagnosis not present

## 2020-07-15 MED ORDER — AMMONIUM LACTATE 12 % EX CREA: TOPICAL_CREAM | CUTANEOUS | 0 refills | Status: DC | PRN

## 2020-07-15 MED ORDER — MELATONIN 5 MG PO TABS: 5.0000 mg | ORAL_TABLET | Freq: Every day | ORAL | 3 refills | Status: DC

## 2020-07-15 NOTE — Patient Instructions (Addendum)
F/U in 5 months, call if you need me before  You are referred to Cardiology ( July appt likely)  You are referred to Bariatric Surgery  And for Eye exam  Labs today and we will call with rsults to your Mom   New for sleep is melatonin  Cream sent for dry skin  Keep up weight loss and exercise, you are doing GREAT!!  Thanks for choosing [facility], we consider it a privelige to serve you.

## 2020-07-16 ENCOUNTER — Other Ambulatory Visit: Payer: Self-pay

## 2020-07-16 ENCOUNTER — Telehealth: Payer: Self-pay

## 2020-07-16 MED ORDER — ATORVASTATIN CALCIUM 40 MG PO TABS: 1.0000 | ORAL_TABLET | Freq: Every day | ORAL | 1 refills | Status: DC

## 2020-07-16 NOTE — Telephone Encounter (Signed)
Yes ok, to do so , please call pharmacy and let them know

## 2020-07-16 NOTE — Telephone Encounter (Signed)
Dawn from [facility] in [place] called received rx for ammonium lactate (AMLACTIN) 12 % cream. Will it be okay to give lotion instead of the cream.  [facility] call back # [phone]

## 2020-07-16 NOTE — Telephone Encounter (Signed)
Dawn informed °

## 2020-07-17 ENCOUNTER — Encounter: Payer: Self-pay | Admitting: Family Medicine

## 2020-07-17 LAB — CMP14+EGFR
ALT: 11 IU/L (ref 0–44)
AST: 11 IU/L (ref 0–40)
Albumin/Globulin Ratio: 1.8 (ref 1.2–2.2)
Albumin: 4.5 g/dL (ref 4.0–5.0)
Alkaline Phosphatase: 85 IU/L (ref 44–121)
BUN/Creatinine Ratio: 17 (ref 9–20)
BUN: 11 mg/dL (ref 6–20)
Bilirubin Total: <0.2 mg/dL (ref 0.0–1.2)
CO2: 25 mmol/L (ref 20–29)
Calcium: 9.3 mg/dL (ref 8.7–10.2)
Chloride: 102 mmol/L (ref 96–106)
Creatinine, Ser: 0.66 mg/dL — ABNORMAL LOW (ref 0.76–1.27)
Globulin, Total: 2.5 g/dL (ref 1.5–4.5)
Glucose: 88 mg/dL (ref 65–99)
Potassium: 3.6 mmol/L (ref 3.5–5.2)
Sodium: 141 mmol/L (ref 134–144)
Total Protein: 7 g/dL (ref 6.0–8.5)
eGFR: 122 mL/min/{1.73_m2} (ref >59)

## 2020-07-17 LAB — CBC
Hematocrit: 37 % — ABNORMAL LOW (ref 37.5–51.0)
Hemoglobin: 12.1 g/dL — ABNORMAL LOW (ref 13.0–17.7)
MCH: 27 pg (ref 26.6–33.0)
MCHC: 32.7 g/dL (ref 31.5–35.7)
MCV: 83 fL (ref 79–97)
Platelets: 306 10*3/uL (ref 150–450)
RBC: 4.48 x10E6/uL (ref 4.14–5.80)
RDW: 16.4 % — ABNORMAL HIGH (ref 11.6–15.4)
WBC: 5.2 10*3/uL (ref 3.4–10.8)

## 2020-07-17 LAB — LIPID PANEL
Chol/HDL Ratio: 6.5 ratio — ABNORMAL HIGH (ref 0.0–5.0)
Cholesterol, Total: 188 mg/dL (ref 100–199)
HDL: 29 mg/dL — ABNORMAL LOW (ref >39)
LDL Chol Calc (NIH): 127 mg/dL — ABNORMAL HIGH (ref 0–99)
Triglycerides: 178 mg/dL — ABNORMAL HIGH (ref 0–149)
VLDL Cholesterol Cal: 32 mg/dL (ref 5–40)

## 2020-07-17 LAB — MICROALBUMIN / CREATININE URINE RATIO
Creatinine, Urine: 262.6 mg/dL
Microalb/Creat Ratio: 6 mg/g creat (ref 0–29)
Microalbumin, Urine: 15.6 ug/mL

## 2020-07-17 LAB — HEMOGLOBIN A1C
Est. average glucose Bld gHb Est-mCnc: 128 mg/dL
Hgb A1c MFr Bld: 6.1 % — ABNORMAL HIGH (ref 4.8–5.6)

## 2020-07-17 LAB — HEPATITIS C ANTIBODY: Hep C Virus Ab: <0.1 s/co ratio (ref 0.0–0.9)

## 2020-07-17 NOTE — Assessment & Plan Note (Addendum)
Improved which is wonderful, refer to Bariatric  surgery  Patient re-educated about  the importance of commitment to a  minimum of 150 minutes of exercise per week as able.  The importance of healthy food choices with portion control discussed, as well as eating regularly and within a 12 hour window most days. The need to choose "clean , green" food 50 to 75% of the time is discussed, as well as to make water the primary drink and set a goal of 64 ounces water daily.    Weight /BMI 07/15/2020 06/13/2020 05/18/2020  WEIGHT 316 lb 316 lb 326 lb  HEIGHT 5\' 5"  5\' 5"  5\' 5"   BMI 52.59 kg/m2 52.59 kg/m2 54.25 kg/m2  Some encounter information is confidential and restricted. Go to Review Flowsheets activity to see all data.

## 2020-07-17 NOTE — Progress Notes (Signed)
Shawn Ramsey     MRN: [id number]      DOB: [birth year]   HPI Shawn Ramsey is here for follow up and re-evaluation of chronic medical conditions, medication management and review of any available recent lab and radiology data.  Preventive health is updated, specifically  Cancer screening and Immunization.    He was hospitalized for nearly 1 month In December wit acute respiratory failure,  and now has a tracheostomy , fls much better, breathing and level of activity and alertness improved. Eating much less with marked weight loss. Has a goal to be able to have tracheostomy closed The PT denies any adverse reactions to current medications since the last visit.  C/o difficulty falling and staying asleep despite the fact that he keeps the environment quiet and dark  ROS Denies recent fever or chills. Denies sinus pressure, nasal congestion, ear pain or sore throat. Denies chest congestion, productive cough or wheezing. Denies chest pains, palpitations and leg swelling Denies abdominal pain, nausea, vomiting,diarrhea or constipation.   Denies dysuria, frequency, hesitancy or incontinence. Denies joint pain, swelling and limitation in mobility. Denies headaches, seizures, numbness, or tingling. Denies uncontrolled depression, anxiety or insomnia. Denies skin break down or rash.   PE  BP 131/77   Ht 5\' 5"  (1.651 m)   Wt (!) 316 lb (143.3 kg)   BMI 52.59 kg/m   Patient alert and oriented and in no cardiopulmonary distress.  HEENT: No facial asymmetry, EOMI,     Neck supple .  Chest: Clear to auscultation bilaterally.  CVS: S1, S2 no murmurs, no S3.Regular rate.  ABD: Soft non tender.   Ext: No edema  MS: Adequate though reduced  ROM spine, shoulders, hips and knees.  Skin: Intact, no ulcerations or rash noted.  Psych: Good eye contact, normal affect. Memory intact not anxious or depressed appearing.  CNS: CN 2-12 intact, power,  normal throughout.no focal deficits  noted.   Assessment & Plan  Diabetes mellitus type 2 in obese (HCC) Controlled, no change in medication Shawn Ramsey is reminded of the importance of commitment to daily physical activity for 30 minutes or more, as able and the need to limit carbohydrate intake to 30 to 60 grams per meal to help with blood sugar control.   The need to take medication as prescribed, test blood sugar as directed, and to call between visits if there is a concern that blood sugar is uncontrolled is also discussed.   Shawn Ramsey is reminded of the importance of daily foot exam, annual eye examination, and good blood sugar, blood pressure and cholesterol control.  Diabetic Labs Latest Ref Rng & Units 07/15/2020 06/13/2020 03/13/2020 03/12/2020 03/06/2020  HbA1c 4.8 - 5.6 % 6.1(H) - - - -  Microalbumin Not Estab. ug/mL - - - - -  Micro/Creat Ratio 0 - 29 mg/g creat 6 - - - -  Chol 100 - 199 mg/dL 188 - - - -  HDL >39 mg/dL 29(L) - - - -  Calc LDL 0 - 99 mg/dL 127(H) - - - -  Triglycerides 0 - 149 mg/dL 178(H) - - - -  Creatinine 0.76 - 1.27 mg/dL 0.66(L) 0.89 0.57(L) 0.58(L) 0.65   BP/Weight 07/15/2020 06/13/2020 05/18/2020 03/19/2020 03/13/2020 02/09/2020 50/93/2671  Systolic BP 245 809 983 382 505 397 673  Diastolic BP 77 57 90 80 73 61 80  Wt. (Lbs) 316 316 326 340 341.93 - 326  BMI 52.59 52.59 54.25 56.58 56.9 - 54.25  Some encounter  information is confidential and restricted. Go to Review Flowsheets activity to see all data.   Foot/eye exam completion dates 01/09/2018 09/19/2016  Foot Form Completion Done Done        Essential hypertension Controlled, no change in medication DASH diet and commitment to daily physical activity for a minimum of 30 minutes discussed and encouraged, as a part of hypertension management. The importance of attaining a healthy weight is also discussed.  BP/Weight 07/15/2020 06/13/2020 05/18/2020 03/19/2020 03/13/2020 02/09/2020 02/58/5277  Systolic BP 824 235 361 443 141 154 008   Diastolic BP 77 57 90 80 73 61 80  Wt. (Lbs) 316 316 326 340 341.93 - 326  BMI 52.59 52.59 54.25 56.58 56.9 - 54.25  Some encounter information is confidential and restricted. Go to Review Flowsheets activity to see all data.       Morbid obesity with BMI of 50.0-59.9, adult ([place]) Improved which is wonderful, refer to Bariatric  surgery  Patient re-educated about  the importance of commitment to a  minimum of 150 minutes of exercise per week as able.  The importance of healthy food choices with portion control discussed, as well as eating regularly and within a 12 hour window most days. The need to choose "clean , green" food 50 to 75% of the time is discussed, as well as to make water the primary drink and set a goal of 64 ounces water daily.    Weight /BMI 07/15/2020 06/13/2020 05/18/2020  WEIGHT 316 lb 316 lb 326 lb  HEIGHT 5\' 5"  5\' 5"  5\' 5"   BMI 52.59 kg/m2 52.59 kg/m2 54.25 kg/m2  Some encounter information is confidential and restricted. Go to Review Flowsheets activity to see all data.      OSA (obstructive sleep apnea) Reports regular use with good results, on Bipap  Vitamin D deficiency Updated lab needed at/ before next visit.   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

## 2020-07-17 NOTE — Assessment & Plan Note (Signed)
Reports regular use with good results, on Bipap

## 2020-07-17 NOTE — Assessment & Plan Note (Signed)
Refer to cardiology for follow up

## 2020-07-17 NOTE — Assessment & Plan Note (Signed)
Controlled, no change in medication Shawn Ramsey is reminded of the importance of commitment to daily physical activity for 30 minutes or more, as able and the need to limit carbohydrate intake to 30 to 60 grams per meal to help with blood sugar control.   The need to take medication as prescribed, test blood sugar as directed, and to call between visits if there is a concern that blood sugar is uncontrolled is also discussed.   Shawn Ramsey is reminded of the importance of daily foot exam, annual eye examination, and good blood sugar, blood pressure and cholesterol control.  Diabetic Labs Latest Ref Rng & Units 07/15/2020 06/13/2020 03/13/2020 03/12/2020 03/06/2020  HbA1c 4.8 - 5.6 % 6.1(H) - - - -  Microalbumin Not Estab. ug/mL - - - - -  Micro/Creat Ratio 0 - 29 mg/g creat 6 - - - -  Chol 100 - 199 mg/dL 188 - - - -  HDL >39 mg/dL 29(L) - - - -  Calc LDL 0 - 99 mg/dL 127(H) - - - -  Triglycerides 0 - 149 mg/dL 178(H) - - - -  Creatinine 0.76 - 1.27 mg/dL 0.66(L) 0.89 0.57(L) 0.58(L) 0.65   BP/Weight 07/15/2020 06/13/2020 05/18/2020 03/19/2020 03/13/2020 02/09/2020 50/04/7046  Systolic BP 889 169 450 388 828 003 491  Diastolic BP 77 57 90 80 73 61 80  Wt. (Lbs) 316 316 326 340 341.93 - 326  BMI 52.59 52.59 54.25 56.58 56.9 - 54.25  Some encounter information is confidential and restricted. Go to Review Flowsheets activity to see all data.   Foot/eye exam completion dates 01/09/2018 09/19/2016  Foot Form Completion Done Done

## 2020-07-17 NOTE — Assessment & Plan Note (Signed)
Ill defined lesion on CT scan, obtain dedicated RUQ ultrasound

## 2020-07-17 NOTE — Assessment & Plan Note (Signed)
Controlled, no change in medication DASH diet and commitment to daily physical activity for a minimum of 30 minutes discussed and encouraged, as a part of hypertension management. The importance of attaining a healthy weight is also discussed.  BP/Weight 07/15/2020 06/13/2020 05/18/2020 03/19/2020 03/13/2020 02/09/2020 29/19/1660  Systolic BP 600 459 977 414 239 532 023  Diastolic BP 77 57 90 80 73 61 80  Wt. (Lbs) 316 316 326 340 341.93 - 326  BMI 52.59 52.59 54.25 56.58 56.9 - 54.25  Some encounter information is confidential and restricted. Go to Review Flowsheets activity to see all data.

## 2020-07-17 NOTE — Assessment & Plan Note (Signed)
prescent since 01/2020, improved breathing and level of function, with weight loss , there is a possibility that this can be discontinued

## 2020-07-17 NOTE — Assessment & Plan Note (Signed)
Updated lab needed at/ before next visit.   

## 2020-07-24 ENCOUNTER — Emergency Department (HOSPITAL_COMMUNITY)
Admission: EM | Admit: 2020-07-24 | Discharge: 2020-07-25 | Disposition: A | Discharge status: Home / Self Care | Payer: Medicaid Other | Attending: Emergency Medicine | Admitting: Emergency Medicine

## 2020-07-24 ENCOUNTER — Encounter (HOSPITAL_COMMUNITY): Payer: Self-pay | Admitting: Emergency Medicine

## 2020-07-24 ENCOUNTER — Other Ambulatory Visit: Payer: Self-pay

## 2020-07-24 DIAGNOSIS — Z8616 Personal history of COVID-19: Secondary | ICD-10-CM | POA: Diagnosis not present

## 2020-07-24 DIAGNOSIS — Z87891 Personal history of nicotine dependence: Secondary | ICD-10-CM | POA: Insufficient documentation

## 2020-07-24 DIAGNOSIS — I110 Hypertensive heart disease with heart failure: Secondary | ICD-10-CM | POA: Insufficient documentation

## 2020-07-24 DIAGNOSIS — R531 Weakness: Secondary | ICD-10-CM | POA: Diagnosis present

## 2020-07-24 DIAGNOSIS — E119 Type 2 diabetes mellitus without complications: Secondary | ICD-10-CM | POA: Diagnosis not present

## 2020-07-24 DIAGNOSIS — Z7951 Long term (current) use of inhaled steroids: Secondary | ICD-10-CM | POA: Diagnosis not present

## 2020-07-24 DIAGNOSIS — E876 Hypokalemia: Secondary | ICD-10-CM | POA: Diagnosis not present

## 2020-07-24 DIAGNOSIS — Z79899 Other long term (current) drug therapy: Secondary | ICD-10-CM | POA: Insufficient documentation

## 2020-07-24 DIAGNOSIS — J45909 Unspecified asthma, uncomplicated: Secondary | ICD-10-CM | POA: Insufficient documentation

## 2020-07-24 DIAGNOSIS — I5032 Chronic diastolic (congestive) heart failure: Secondary | ICD-10-CM | POA: Diagnosis not present

## 2020-07-24 DIAGNOSIS — J449 Chronic obstructive pulmonary disease, unspecified: Secondary | ICD-10-CM | POA: Diagnosis not present

## 2020-07-24 DIAGNOSIS — Z7984 Long term (current) use of oral hypoglycemic drugs: Secondary | ICD-10-CM | POA: Diagnosis not present

## 2020-07-24 LAB — BASIC METABOLIC PANEL
Anion gap: 12 (ref 5–15)
BUN: 18 mg/dL (ref 6–20)
CO2: 25 mmol/L (ref 22–32)
Calcium: 8.5 mg/dL — ABNORMAL LOW (ref 8.9–10.3)
Chloride: 101 mmol/L (ref 98–111)
Creatinine, Ser: 0.89 mg/dL (ref 0.61–1.24)
GFR, Estimated: >60 mL/min (ref >60)
Glucose, Bld: 135 mg/dL — ABNORMAL HIGH (ref 70–99)
Potassium: 3.2 mmol/L — ABNORMAL LOW (ref 3.5–5.1)
Sodium: 138 mmol/L (ref 135–145)

## 2020-07-24 LAB — CBC
HCT: 39.4 % (ref 39.0–52.0)
Hemoglobin: 13.1 g/dL (ref 13.0–17.0)
MCH: 27.8 pg (ref 26.0–34.0)
MCHC: 33.2 g/dL (ref 30.0–36.0)
MCV: 83.7 fL (ref 80.0–100.0)
Platelets: 417 10*3/uL — ABNORMAL HIGH (ref 150–400)
RBC: 4.71 MIL/uL (ref 4.22–5.81)
RDW: 16.3 % — ABNORMAL HIGH (ref 11.5–15.5)
WBC: 9.4 10*3/uL (ref 4.0–10.5)
nRBC: 0 % (ref 0.0–0.2)

## 2020-07-24 NOTE — ED Triage Notes (Signed)
Pt c/o weakness and nausea after taking doxycycline Tuesday for a ? Infection in his trach per [facility].

## 2020-07-25 MED ORDER — SULFAMETHOXAZOLE-TRIMETHOPRIM 800-160 MG PO TABS: 1.0000 | ORAL_TABLET | Freq: Once | ORAL | Status: DC

## 2020-07-25 MED ORDER — ACETAMINOPHEN 500 MG PO TABS
1000.0000 mg | ORAL_TABLET | Freq: Once | ORAL | Status: AC
  Administered 2020-07-25: 1000 mg via ORAL
  Filled 2020-07-25: qty 2

## 2020-07-25 MED ORDER — POTASSIUM CHLORIDE CRYS ER 20 MEQ PO TBCR: 40.0000 meq | EXTENDED_RELEASE_TABLET | Freq: Every day | ORAL | 0 refills | Status: DC

## 2020-07-25 MED ORDER — SULFAMETHOXAZOLE-TRIMETHOPRIM 800-160 MG PO TABS: 1.0000 | ORAL_TABLET | Freq: Two times a day (BID) | ORAL | 0 refills | Status: AC

## 2020-07-25 MED ORDER — METOCLOPRAMIDE HCL 10 MG PO TABS
10.0000 mg | ORAL_TABLET | Freq: Once | ORAL | Status: AC
  Administered 2020-07-25: 10 mg via ORAL
  Filled 2020-07-25: qty 1

## 2020-07-25 NOTE — ED Notes (Signed)
ED Provider at bedside. 
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
## 2020-07-28 ENCOUNTER — Emergency Department (HOSPITAL_COMMUNITY): Payer: Medicaid Other

## 2020-07-28 ENCOUNTER — Other Ambulatory Visit: Payer: Self-pay

## 2020-07-28 ENCOUNTER — Ambulatory Visit (HOSPITAL_COMMUNITY)
Admission: RE | Admit: 2020-07-28 | Discharge: 2020-07-28 | Disposition: A | Discharge status: Home / Self Care | Payer: Medicaid Other | Source: Ambulatory Visit | Attending: Family Medicine | Admitting: Family Medicine

## 2020-07-28 ENCOUNTER — Encounter (HOSPITAL_COMMUNITY): Payer: Self-pay | Admitting: Emergency Medicine

## 2020-07-28 ENCOUNTER — Emergency Department (HOSPITAL_COMMUNITY)
Admission: EM | Admit: 2020-07-28 | Discharge: 2020-07-28 | Disposition: A | Discharge status: Home / Self Care | Payer: Medicaid Other | Attending: Emergency Medicine | Admitting: Emergency Medicine

## 2020-07-28 DIAGNOSIS — Z7984 Long term (current) use of oral hypoglycemic drugs: Secondary | ICD-10-CM | POA: Insufficient documentation

## 2020-07-28 DIAGNOSIS — R0602 Shortness of breath: Secondary | ICD-10-CM | POA: Diagnosis not present

## 2020-07-28 DIAGNOSIS — K769 Liver disease, unspecified: Secondary | ICD-10-CM

## 2020-07-28 DIAGNOSIS — I5032 Chronic diastolic (congestive) heart failure: Secondary | ICD-10-CM | POA: Diagnosis not present

## 2020-07-28 DIAGNOSIS — R072 Precordial pain: Secondary | ICD-10-CM

## 2020-07-28 DIAGNOSIS — R0789 Other chest pain: Secondary | ICD-10-CM | POA: Insufficient documentation

## 2020-07-28 DIAGNOSIS — E119 Type 2 diabetes mellitus without complications: Secondary | ICD-10-CM | POA: Diagnosis not present

## 2020-07-28 DIAGNOSIS — J449 Chronic obstructive pulmonary disease, unspecified: Secondary | ICD-10-CM | POA: Diagnosis not present

## 2020-07-28 DIAGNOSIS — I110 Hypertensive heart disease with heart failure: Secondary | ICD-10-CM | POA: Insufficient documentation

## 2020-07-28 DIAGNOSIS — R062 Wheezing: Secondary | ICD-10-CM

## 2020-07-28 DIAGNOSIS — Z20822 Contact with and (suspected) exposure to covid-19: Secondary | ICD-10-CM | POA: Insufficient documentation

## 2020-07-28 DIAGNOSIS — Z9889 Other specified postprocedural states: Secondary | ICD-10-CM

## 2020-07-28 DIAGNOSIS — Z79899 Other long term (current) drug therapy: Secondary | ICD-10-CM | POA: Diagnosis not present

## 2020-07-28 DIAGNOSIS — E876 Hypokalemia: Secondary | ICD-10-CM

## 2020-07-28 DIAGNOSIS — Z87891 Personal history of nicotine dependence: Secondary | ICD-10-CM | POA: Diagnosis not present

## 2020-07-28 DIAGNOSIS — Z8616 Personal history of COVID-19: Secondary | ICD-10-CM | POA: Insufficient documentation

## 2020-07-28 LAB — CBC
HCT: 39 % (ref 39.0–52.0)
Hemoglobin: 12.8 g/dL — ABNORMAL LOW (ref 13.0–17.0)
MCH: 28 pg (ref 26.0–34.0)
MCHC: 32.8 g/dL (ref 30.0–36.0)
MCV: 85.3 fL (ref 80.0–100.0)
Platelets: 384 10*3/uL (ref 150–400)
RBC: 4.57 MIL/uL (ref 4.22–5.81)
RDW: 16.2 % — ABNORMAL HIGH (ref 11.5–15.5)
WBC: 6.8 10*3/uL (ref 4.0–10.5)
nRBC: 0 % (ref 0.0–0.2)

## 2020-07-28 LAB — RESP PANEL BY RT-PCR (FLU A&B, COVID) ARPGX2
Influenza A by PCR: NEGATIVE
Influenza B by PCR: NEGATIVE
SARS Coronavirus 2 by RT PCR: NEGATIVE

## 2020-07-28 LAB — BASIC METABOLIC PANEL
Anion gap: 10 (ref 5–15)
BUN: 15 mg/dL (ref 6–20)
CO2: 28 mmol/L (ref 22–32)
Calcium: 8.5 mg/dL — ABNORMAL LOW (ref 8.9–10.3)
Chloride: 100 mmol/L (ref 98–111)
Creatinine, Ser: 0.76 mg/dL (ref 0.61–1.24)
GFR, Estimated: >60 mL/min (ref >60)
Glucose, Bld: 98 mg/dL (ref 70–99)
Potassium: 3.2 mmol/L — ABNORMAL LOW (ref 3.5–5.1)
Sodium: 138 mmol/L (ref 135–145)

## 2020-07-28 LAB — TROPONIN I (HIGH SENSITIVITY): Troponin I (High Sensitivity): 5 ng/L (ref <18)

## 2020-07-28 MED ORDER — POTASSIUM CHLORIDE CRYS ER 20 MEQ PO TBCR
40.0000 meq | EXTENDED_RELEASE_TABLET | Freq: Once | ORAL | Status: AC
  Administered 2020-07-28: 40 meq via ORAL
  Filled 2020-07-28: qty 2

## 2020-07-28 MED ORDER — ALBUTEROL SULFATE HFA 108 (90 BASE) MCG/ACT IN AERS
4.0000 | INHALATION_SPRAY | Freq: Once | RESPIRATORY_TRACT | Status: AC
  Administered 2020-07-28: 4 via RESPIRATORY_TRACT
  Filled 2020-07-28: qty 6.7

## 2020-07-28 NOTE — ED Triage Notes (Signed)
Pt to the ED with multiple complaints.  Pt has had nonradiating intermittent central chest pain described as sharp for the past few days.  Pt also complains of right eye pain and was seen by an opthalmologist yesterday.
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
## 2020-07-28 NOTE — Discharge Instructions (Addendum)
It was our pleasure to provide your ER care today - we hope that you feel better.  Use albuterol inhaler as need if wheezing.   From today's labs, your potassium level is mildly low (3.2) - eat plenty of fruits and vegetables, take potassium supplement as recently prescribed, and follow up with primary care doctor in 1 week.  Return to ER if worse, new symptoms, fevers, recurrent or persistent chest pain, increased trouble breathing, or other concern.

## 2020-07-29 ENCOUNTER — Telehealth: Payer: Self-pay

## 2020-07-29 NOTE — Telephone Encounter (Signed)
Shawn Ramsey stated pt had been in and out of 3 different ers set pt up appt first available next week er follow up 6-15 with gray advised if he felt worse or no better to go to urgent care or ER with verbal understanding

## 2020-07-29 NOTE — Telephone Encounter (Signed)
[name] called said patient been having headaches/blurry vision/ blood pressure take 2 times 100/66 and 100/61 not sure what he should do.  Call [phone] # [phone].

## 2020-07-29 NOTE — Telephone Encounter (Signed)
error 

## 2020-08-04 ENCOUNTER — Other Ambulatory Visit: Payer: Self-pay | Admitting: Family Medicine

## 2020-08-05 ENCOUNTER — Ambulatory Visit (INDEPENDENT_AMBULATORY_CARE_PROVIDER_SITE_OTHER): Payer: Medicaid Other | Admitting: Nurse Practitioner

## 2020-08-05 ENCOUNTER — Encounter: Payer: Self-pay | Admitting: Nurse Practitioner

## 2020-08-05 ENCOUNTER — Other Ambulatory Visit: Payer: Self-pay

## 2020-08-05 VITALS — BP 113/71 | HR 86 | Temp 99.6°F | Resp 22 | Ht 65.0 in | Wt 315.0 lb

## 2020-08-05 DIAGNOSIS — Z6841 Body Mass Index (BMI) 40.0 and over, adult: Secondary | ICD-10-CM

## 2020-08-05 DIAGNOSIS — G43719 Chronic migraine without aura, intractable, without status migrainosus: Secondary | ICD-10-CM | POA: Diagnosis not present

## 2020-08-05 DIAGNOSIS — G43909 Migraine, unspecified, not intractable, without status migrainosus: Secondary | ICD-10-CM | POA: Insufficient documentation

## 2020-08-05 MED ORDER — KETOROLAC TROMETHAMINE 60 MG/2ML IM SOLN
30.0000 mg | Freq: Once | INTRAMUSCULAR | Status: AC
  Administered 2020-08-05: 30 mg via INTRAMUSCULAR

## 2020-08-05 MED ORDER — RIZATRIPTAN BENZOATE 5 MG PO TBDP
5.0000 mg | ORAL_TABLET | ORAL | 0 refills | Status: DC | PRN
Start: 2020-08-05 — End: 2021-04-20

## 2020-08-05 NOTE — Assessment & Plan Note (Signed)
BMI Readings from Last 3 Encounters:  08/05/20 52.42 kg/m  07/28/20 50.09 kg/m  07/24/20 52.57 kg/m  -he would like consult to bariatric surgery; will honor that request
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
## 2020-08-05 NOTE — Assessment & Plan Note (Signed)
-  has had migraine off and on for over a week -Rx. Maxalt; he denies cardica hx or current SOB -IM toradol -referral to neurology for worsening headaches

## 2020-08-05 NOTE — Addendum Note (Signed)
Addended by: Lonn Georgia on: 08/05/2020 03:25 PM   Modules accepted: Orders

## 2020-08-12 ENCOUNTER — Other Ambulatory Visit: Payer: Self-pay

## 2020-08-12 ENCOUNTER — Encounter: Payer: Self-pay | Admitting: Family Medicine

## 2020-08-12 ENCOUNTER — Ambulatory Visit (INDEPENDENT_AMBULATORY_CARE_PROVIDER_SITE_OTHER): Payer: Medicaid Other | Admitting: Family Medicine

## 2020-08-12 VITALS — BP 110/70 | HR 95 | Temp 99.7°F | Resp 20 | Ht 65.0 in | Wt 309.0 lb

## 2020-08-12 DIAGNOSIS — E1169 Type 2 diabetes mellitus with other specified complication: Secondary | ICD-10-CM | POA: Diagnosis not present

## 2020-08-12 DIAGNOSIS — I10 Essential (primary) hypertension: Secondary | ICD-10-CM | POA: Diagnosis not present

## 2020-08-12 DIAGNOSIS — I5032 Chronic diastolic (congestive) heart failure: Secondary | ICD-10-CM | POA: Diagnosis not present

## 2020-08-12 DIAGNOSIS — E785 Hyperlipidemia, unspecified: Secondary | ICD-10-CM

## 2020-08-12 DIAGNOSIS — E669 Obesity, unspecified: Secondary | ICD-10-CM

## 2020-08-12 DIAGNOSIS — R932 Abnormal findings on diagnostic imaging of liver and biliary tract: Secondary | ICD-10-CM

## 2020-08-12 NOTE — Patient Instructions (Addendum)
F/U in October as before, call if you need me sooner  You are referred to GI re abnormal [place] of liver   Fasting lipid, cmp and eGFR nd hBA1C 1 week before f/u You are referred to surgery re possible gallstone with symptoms  Congrats on weight loss, keep it up  Please discuss poor sleep with Dr Modesta Messing  It is important that you exercise regularly at least 30 minutes 5 times a week. If you develop chest pain, have severe difficulty breathing, or feel very tired, stop exercising immediately and seek medical attention    You NEED covid vaccines  Thanks for choosing  [facility], we consider it a privelige to serve you.
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
## 2020-08-16 ENCOUNTER — Encounter: Payer: Self-pay | Admitting: Family Medicine

## 2020-08-16 DIAGNOSIS — R932 Abnormal findings on diagnostic imaging of liver and biliary tract: Secondary | ICD-10-CM | POA: Insufficient documentation

## 2020-08-16 NOTE — Assessment & Plan Note (Signed)
Refer to GI for further eval and management as deemed necessary

## 2020-08-16 NOTE — Progress Notes (Signed)
OPIE MACLAUGHLIN     MRN: [id number]      DOB: 11-16-1980   HPI Mr. Beagley is here for review of recent imaging studies to follow up and discuss management options C/o poor sleep , is being followed by Psychiatry who should address this Doing extremely well with weight loss, hopes to get to 250 pounds so that tracheostomy can be closed  s  ROS Denies recent fever or chills. Denies sinus pressure, nasal congestion, ear pain or sore throat. Denies chest congestion, productive cough or wheezing. Denies chest pains, palpitations and leg swelling   Denies dysuria, frequency, hesitancy or incontinence. C/o  joint pain,  and limitation in mobility. Denies headaches, seizures, numbness, or tingling. Denies uncontrolled  depression, anxiety and c/o  insomnia. Denies skin break down or rash.   PE  BP 110/70   Pulse 95   Temp 99.7 F (37.6 C)   Resp 20   Ht 5\' 5"  (1.651 m)   Wt (!) 309 lb (140.2 kg)   SpO2 93%   BMI 51.42 kg/m   Patient alert and oriented and in no cardiopulmonary distress.  HEENT: No facial asymmetry, EOMI,     Neck supple .  Chest: Clear to auscultation bilaterally.  CVS: S1, S2 no murmurs, no S3.Regular rate.  ABD: Soft non tender.   Ext: No edema  MS: Adequate though reduced ROM spine, shoulders, hips and knees.  Skin: Intact, no ulcerations or rash noted.  Psych: Good eye contact, normal affect. Memory intact not anxious or depressed appearing.  CNS: CN 2-12 intact, power,  normal throughout.no focal deficits noted.   Assessment & Plan  Abnormal liver ultrasound Refer to GI for further eval and management as deemed necessary  Abnormal gallbladder ultrasound Abnormal imaging study with bloating ,  possible gallstone vs polyp, refer to surg for eval, pt is high risk surgical candidate so unlikely to have surgery barring emergency  Chronic diastolic heart failure ([place]) Stable on current regime with improved health habits  Diabetes mellitus  type 2 in obese [facility]) Mr. Spatafore is reminded of the importance of commitment to daily physical activity for 30 minutes or more, as able and the need to limit carbohydrate intake to 30 to 60 grams per meal to help with blood sugar control.   The need to take medication as prescribed, test blood sugar as directed, and to call between visits if there is a concern that blood sugar is uncontrolled is also discussed.   Mr. Seliga is reminded of the importance of daily foot exam, annual eye examination, and good blood sugar, blood pressure and cholesterol control.  Diabetic Labs Latest Ref Rng & Units 07/28/2020 07/24/2020 07/15/2020 06/13/2020 03/13/2020  HbA1c 4.8 - 5.6 % - - 6.1(H) - -  Microalbumin Not Estab. ug/mL - - - - -  Micro/Creat Ratio 0 - 29 mg/g creat - - 6 - -  Chol 100 - 199 mg/dL - - 188 - -  HDL >39 mg/dL - - 29(L) - -  Calc LDL 0 - 99 mg/dL - - 127(H) - -  Triglycerides 0 - 149 mg/dL - - 178(H) - -  Creatinine 0.61 - 1.24 mg/dL 0.76 0.89 0.66(L) 0.89 0.57(L)   BP/Weight 08/12/2020 08/05/2020 07/28/2020 07/25/2020 07/24/2020 07/15/2020 0/63/0160  Systolic BP 109 323 557 322 - 025 427  Diastolic BP 70 71 75 75 - 77 57  Wt. (Lbs) 309 315 301 - 315.92 316 316  BMI 51.42 52.42 50.09 - 52.57 52.59  52.59  Some encounter information is confidential and restricted. Go to Review Flowsheets activity to see all data.   Foot/eye exam completion dates 01/09/2018 09/19/2016  Foot Form Completion Done Done

## 2020-08-16 NOTE — Assessment & Plan Note (Signed)
Shawn Ramsey is reminded of the importance of commitment to daily physical activity for 30 minutes or more, as able and the need to limit carbohydrate intake to 30 to 60 grams per meal to help with blood sugar control.   The need to take medication as prescribed, test blood sugar as directed, and to call between visits if there is a concern that blood sugar is uncontrolled is also discussed.   Shawn Ramsey is reminded of the importance of daily foot exam, annual eye examination, and good blood sugar, blood pressure and cholesterol control.  Diabetic Labs Latest Ref Rng & Units 07/28/2020 07/24/2020 07/15/2020 06/13/2020 03/13/2020  HbA1c 4.8 - 5.6 % - - 6.1(H) - -  Microalbumin Not Estab. ug/mL - - - - -  Micro/Creat Ratio 0 - 29 mg/g creat - - 6 - -  Chol 100 - 199 mg/dL - - 188 - -  HDL >39 mg/dL - - 29(L) - -  Calc LDL 0 - 99 mg/dL - - 127(H) - -  Triglycerides 0 - 149 mg/dL - - 178(H) - -  Creatinine 0.61 - 1.24 mg/dL 0.76 0.89 0.66(L) 0.89 0.57(L)   BP/Weight 08/12/2020 08/05/2020 07/28/2020 07/25/2020 07/24/2020 07/15/2020 6/43/8377  Systolic BP 939 688 648 472 - 072 182  Diastolic BP 70 71 75 75 - 77 57  Wt. (Lbs) 309 315 301 - 315.92 316 316  BMI 51.42 52.42 50.09 - 52.57 52.59 52.59  Some encounter information is confidential and restricted. Go to Review Flowsheets activity to see all data.   Foot/eye exam completion dates 01/09/2018 09/19/2016  Foot Form Completion Done Done

## 2020-08-16 NOTE — Assessment & Plan Note (Signed)
Stable on current regime with improved health habits

## 2020-08-16 NOTE — Assessment & Plan Note (Signed)
Abnormal imaging study with bloating ,  possible gallstone vs polyp, refer to surg for eval, pt is high risk surgical candidate so unlikely to have surgery barring emergency

## 2020-08-18 ENCOUNTER — Encounter: Payer: Self-pay | Admitting: Psychiatry

## 2020-08-18 ENCOUNTER — Telehealth (INDEPENDENT_AMBULATORY_CARE_PROVIDER_SITE_OTHER): Payer: Medicaid Other | Admitting: Psychiatry

## 2020-08-18 ENCOUNTER — Other Ambulatory Visit: Payer: Self-pay

## 2020-08-18 ENCOUNTER — Ambulatory Visit: Payer: Medicaid Other | Admitting: Family Medicine

## 2020-08-18 DIAGNOSIS — F79 Unspecified intellectual disabilities: Secondary | ICD-10-CM

## 2020-08-18 DIAGNOSIS — F330 Major depressive disorder, recurrent, mild: Secondary | ICD-10-CM | POA: Diagnosis not present

## 2020-08-18 MED ORDER — SERTRALINE HCL 50 MG PO TABS: 50.0000 mg | ORAL_TABLET | Freq: Every day | ORAL | 3 refills | Status: DC

## 2020-08-18 NOTE — Patient Instructions (Signed)
1. Increase sertraline 50 mg daily 2. Next appointment: 10/24 at 1 PM

## 2020-08-20 ENCOUNTER — Encounter: Payer: Self-pay | Admitting: Internal Medicine
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
## 2020-08-21 ENCOUNTER — Ambulatory Visit: Payer: Medicaid Other | Admitting: Family Medicine
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
## 2020-08-26 ENCOUNTER — Ambulatory Visit (INDEPENDENT_AMBULATORY_CARE_PROVIDER_SITE_OTHER): Payer: Medicaid Other | Admitting: Family Medicine

## 2020-08-26 ENCOUNTER — Encounter: Payer: Self-pay | Admitting: Family Medicine

## 2020-08-26 VITALS — BP 120/80 | HR 81 | Ht 65.0 in | Wt 306.8 lb

## 2020-08-26 DIAGNOSIS — I5032 Chronic diastolic (congestive) heart failure: Secondary | ICD-10-CM

## 2020-08-26 DIAGNOSIS — G4733 Obstructive sleep apnea (adult) (pediatric): Secondary | ICD-10-CM

## 2020-08-26 DIAGNOSIS — E782 Mixed hyperlipidemia: Secondary | ICD-10-CM | POA: Diagnosis not present

## 2020-08-26 NOTE — Patient Instructions (Signed)
Medication Instructions:  Your physician recommends that you continue on your current medications as directed. Please refer to the Current Medication list given to you today.  *If you need a refill on your cardiac medications before your next appointment, please call your pharmacy*   Lab Work: None If you have labs (blood work) drawn today and your tests are completely normal, you will receive your results only by: [place] (if you have MyChart) OR A paper copy in the mail If you have any lab test that is abnormal or we need to change your treatment, we will call you to review the results.   Testing/Procedures: None   Follow-Up: At [facility], you and your health needs are our priority.  As part of our continuing mission to provide you with exceptional heart care, we have created designated Provider Care Teams.  These Care Teams include your primary Cardiologist (physician) and Advanced Practice Providers (APPs -  Physician Assistants and Nurse Practitioners) who all work together to provide you with the care you need, when you need it.  We recommend signing up for the patient portal called "MyChart".  Sign up information is provided on this After Visit Summary.  MyChart is used to connect with patients for Virtual Visits (Telemedicine).  Patients are able to view lab/test results, encounter notes, upcoming appointments, etc.  Non-urgent messages can be sent to your provider as well.   To learn more about what you can do with MyChart, go to [email or web address].    Your next appointment:   6 month(s)  The format for your next appointment:   In Person  Provider:   Katina Dung, NP    Other Instructions

## 2020-09-01 ENCOUNTER — Ambulatory Visit (INDEPENDENT_AMBULATORY_CARE_PROVIDER_SITE_OTHER): Payer: Medicaid Other | Admitting: General Surgery

## 2020-09-01 ENCOUNTER — Encounter: Payer: Self-pay | Admitting: General Surgery

## 2020-09-01 ENCOUNTER — Other Ambulatory Visit: Payer: Self-pay

## 2020-09-01 VITALS — BP 106/68 | HR 76 | Temp 98.7°F | Resp 20 | Ht 65.0 in | Wt 311.0 lb

## 2020-09-01 DIAGNOSIS — R932 Abnormal findings on diagnostic imaging of liver and biliary tract: Secondary | ICD-10-CM

## 2020-09-01 NOTE — Patient Instructions (Signed)
Will get Dr. [name] to order an MRI of the liver to further assess the liver lesion. Will also discuss with her the option of surveillance of the gallbladder given the issues with lungs and heart and tracheostomy in place. This would mean a repeat US in a few months.  If you need surgery, the best option would be in [place] where the is more support with anesthesia, cardiology, pulmonology.
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
## 2020-09-03 ENCOUNTER — Other Ambulatory Visit: Payer: Self-pay | Admitting: Family Medicine

## 2020-09-04 ENCOUNTER — Other Ambulatory Visit (HOSPITAL_COMMUNITY)
Admission: RE | Admit: 2020-09-04 | Discharge: 2020-09-04 | Disposition: A | Discharge status: Home / Self Care | Payer: Medicaid Other | Source: Ambulatory Visit | Attending: Family Medicine | Admitting: Family Medicine

## 2020-09-04 ENCOUNTER — Other Ambulatory Visit: Payer: Self-pay

## 2020-09-04 DIAGNOSIS — Z20822 Contact with and (suspected) exposure to covid-19: Secondary | ICD-10-CM | POA: Insufficient documentation

## 2020-09-04 DIAGNOSIS — Z01812 Encounter for preprocedural laboratory examination: Secondary | ICD-10-CM | POA: Insufficient documentation

## 2020-09-04 LAB — SARS CORONAVIRUS 2 (TAT 6-24 HRS): SARS Coronavirus 2: NEGATIVE

## 2020-09-07 ENCOUNTER — Ambulatory Visit (HOSPITAL_COMMUNITY)
Admission: RE | Admit: 2020-09-07 | Discharge: 2020-09-07 | Disposition: A | Discharge status: Home / Self Care | Payer: Medicaid Other | Source: Ambulatory Visit | Attending: Acute Care | Admitting: Acute Care

## 2020-09-07 ENCOUNTER — Other Ambulatory Visit: Payer: Self-pay | Admitting: Family Medicine

## 2020-09-07 ENCOUNTER — Other Ambulatory Visit: Payer: Self-pay

## 2020-09-07 DIAGNOSIS — Z430 Encounter for attention to tracheostomy: Secondary | ICD-10-CM | POA: Insufficient documentation

## 2020-09-07 DIAGNOSIS — Z930 Tracheostomy status: Secondary | ICD-10-CM

## 2020-09-07 DIAGNOSIS — J9611 Chronic respiratory failure with hypoxia: Secondary | ICD-10-CM | POA: Insufficient documentation

## 2020-09-07 NOTE — Progress Notes (Signed)
[facility]   Reason for visit:  Planned trach change  HPI:  Well known to me. 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 Also followed by Dr Constance Holster w/ ENT. Comes in accompanied by his mother. Since last visit actually looking really well. Has lost several pounds. His activity tolerance has improved. WOB improved. Overall he feels better. Presents today for routine trach change.   -last note from ENT 6/21: notes might consider trach removal once at 250lbs  ROS  History obtained from mother and the patient General ROS: negative for - chills, fatigue, fever, malaise, night sweats, sleep disturbance, or weight gain ENT ROS: negative for - headaches, nasal congestion, sinus pain, or sore throat No issues from trach  Allergy and Immunology ROS: negative for - itchy/watery eyes, nasal congestion, postnasal drip, or seasonal allergies Respiratory ROS: negative for - cough, hemoptysis, pleuritic pain, shortness of breath, sputum changes, or wheezing Cardiovascular ROS: negative for - chest pain, edema, or palpitations Gastrointestinal ROS: negative for - abdominal pain, constipation, or diarrhea Musculoskeletal ROS: negative for - gait disturbance, joint pain, or joint stiffness Neurological ROS: negative Vital signs:  Reviewed: Pulse Ox mid 90s  Exam:  no distress ENT exam normal, no neck nodes or sinus tenderness and trach site unremarkable. He phonates well w/ PMV in place. Can tolerate finger occlusion w/out discomfort. I did not notice rush of air w/ PMV removal BUT he notes that he would notice this if he removes the PMV after exerting self such as walking long distance.  clear to auscultation bilaterally regular rate and rhythm Abdomen soft and nontender without distention, masses , no wound infection noted. extremities normal, atraumatic, no cyanosis or edema Alert and oriented x 3, gait normal., reflexes normal and symmetric, strength and  sensation grossly  normal  Trach change/procedure:  trach change size 8 proximal xlt cuffliess  Wound appearance: unremarkable  Placement confirmed w/ ETCO2      Impression/dx  Chronic hypoxic respiratory failure  Morbid obesity  Severe OSA Severe OHS Trach dependence    Discussion  Mychael is doing well. I am happy to see his weight loss and agree w/ Dr Constance Holster we could consider evaluation for trach removal after further weight loss. I would however add that before we consider removing trach I see a few obstacles. 1) could he have some element of tracheals stenosis? He does reports a rush of air from the trach when PMV removed after walking. If he continues to note this after further weight loss then needs ENT to eval first. 2) barrier is his compliance and general health literacy. Even at 250 lbs he will still have OSA and thus almost certainly need CPAP. He would need to also prove that he can tolerate CPAP therapy prior to decannulation.  Plan  Cont routine trach care Cont PMV during the day  ROV 3 months  Cont weight loss    Visit time: 32 minutes.   Erick Colace ACNP-BC [place]

## 2020-09-07 NOTE — Progress Notes (Signed)
Tracheostomy Procedure Note  CEFERINO LANG [id number] December 22, 1980  Pre Procedure Tracheostomy Information  Trach Brand: Shiley Size:  8.0  XLTUP Style: Uncuffed Secured by: Velcro   Procedure: Trach Change and Trach Cleanining    Post Procedure Tracheostomy Information  Trach Brand: Shiley Size:  8.0   XLTUP Style: Uncuffed Secured by: Velcro   Post Procedure Evaluation:  ETCO2 positive color change from yellow to purple : Yes.   Vital signs:VSS Patients current condition: stable Complications: No apparent complications Trach site exam: clean and dry Wound care done: 4 x 4 gauze drain Patient did tolerate procedure well.   Education: none  Prescription needs: none    Additional needs: none

## 2020-09-08 ENCOUNTER — Telehealth: Payer: Self-pay

## 2020-09-08 NOTE — Telephone Encounter (Signed)
Patient mother called said can labs be ordered to get thyroid check.

## 2020-09-11 ENCOUNTER — Other Ambulatory Visit: Payer: Self-pay | Admitting: Family Medicine

## 2020-09-11 DIAGNOSIS — K769 Liver disease, unspecified: Secondary | ICD-10-CM

## 2020-09-11 DIAGNOSIS — K824 Cholesterolosis of gallbladder: Secondary | ICD-10-CM

## 2020-09-15 ENCOUNTER — Ambulatory Visit: Payer: Medicaid Other | Admitting: Neurology

## 2020-09-15 NOTE — Telephone Encounter (Signed)
Pt mother informed

## 2020-09-17 ENCOUNTER — Other Ambulatory Visit: Payer: Self-pay | Admitting: Family Medicine

## 2020-09-22 ENCOUNTER — Telehealth: Payer: Self-pay

## 2020-09-22 NOTE — Telephone Encounter (Signed)
Please advise how he should be taking the torsemide. The med list states once daily quantity #30. She is saying he was taking 2 bid and the rx said 1 bid but it was dispensed #30 once daily.

## 2020-09-22 NOTE — Telephone Encounter (Signed)
Patients mother called questioning the perscription for torsemide she states he has been taking 2 tablets twice daily and it was called in for 1 tablet twice daily ph# [phone]

## 2020-09-23 ENCOUNTER — Ambulatory Visit: Payer: Medicaid Other | Admitting: Neurology

## 2020-09-23 ENCOUNTER — Other Ambulatory Visit: Payer: Self-pay

## 2020-09-23 MED ORDER — TORSEMIDE 20 MG PO TABS: 20.0000 mg | ORAL_TABLET | Freq: Every day | ORAL | 3 refills | Status: DC

## 2020-09-23 NOTE — Telephone Encounter (Signed)
Spoke with pt and he is aware. 

## 2020-09-23 NOTE — Telephone Encounter (Signed)
Refill sent.

## 2020-09-23 NOTE — Telephone Encounter (Signed)
called need med refill said pharmacy never received the [place]mo

## 2020-09-24 ENCOUNTER — Telehealth: Payer: Self-pay | Admitting: Pulmonary Disease

## 2020-09-24 NOTE — Telephone Encounter (Signed)
ATC Wanda per DPR, LMTCB

## 2020-09-25 ENCOUNTER — Telehealth: Payer: Self-pay | Admitting: Pulmonary Disease

## 2020-09-25 MED ORDER — ALBUTEROL SULFATE HFA 108 (90 BASE) MCG/ACT IN AERS: 1.0000 | INHALATION_SPRAY | Freq: Four times a day (QID) | RESPIRATORY_TRACT | 3 refills | Status: DC | PRN

## 2020-09-25 NOTE — Telephone Encounter (Signed)
Called and spoke with wife [name] per DPR who verified which medication patient needed and preferred pharmacy. Refill has been sent. Nothing further needed at this time.

## 2020-09-25 NOTE — Telephone Encounter (Signed)
Call returned to pharmacy, patient info confirmed. Okay to switch to pro-air.   Nothing further needed at this time.

## 2020-09-27 ENCOUNTER — Emergency Department (HOSPITAL_COMMUNITY): Payer: Medicaid Other

## 2020-09-27 ENCOUNTER — Emergency Department (HOSPITAL_COMMUNITY)
Admission: EM | Admit: 2020-09-27 | Discharge: 2020-09-27 | Disposition: A | Discharge status: Home / Self Care | Payer: Medicaid Other | Attending: Emergency Medicine | Admitting: Emergency Medicine

## 2020-09-27 ENCOUNTER — Encounter (HOSPITAL_COMMUNITY): Payer: Self-pay | Admitting: *Deleted

## 2020-09-27 ENCOUNTER — Other Ambulatory Visit: Payer: Self-pay

## 2020-09-27 DIAGNOSIS — J45991 Cough variant asthma: Secondary | ICD-10-CM | POA: Insufficient documentation

## 2020-09-27 DIAGNOSIS — F88 Other disorders of psychological development: Secondary | ICD-10-CM | POA: Diagnosis not present

## 2020-09-27 DIAGNOSIS — I110 Hypertensive heart disease with heart failure: Secondary | ICD-10-CM | POA: Diagnosis not present

## 2020-09-27 DIAGNOSIS — Z79899 Other long term (current) drug therapy: Secondary | ICD-10-CM | POA: Diagnosis not present

## 2020-09-27 DIAGNOSIS — R0981 Nasal congestion: Secondary | ICD-10-CM | POA: Insufficient documentation

## 2020-09-27 DIAGNOSIS — Z8616 Personal history of COVID-19: Secondary | ICD-10-CM | POA: Insufficient documentation

## 2020-09-27 DIAGNOSIS — E119 Type 2 diabetes mellitus without complications: Secondary | ICD-10-CM | POA: Diagnosis not present

## 2020-09-27 DIAGNOSIS — Z87891 Personal history of nicotine dependence: Secondary | ICD-10-CM | POA: Diagnosis not present

## 2020-09-27 DIAGNOSIS — Z7984 Long term (current) use of oral hypoglycemic drugs: Secondary | ICD-10-CM | POA: Diagnosis not present

## 2020-09-27 DIAGNOSIS — I5032 Chronic diastolic (congestive) heart failure: Secondary | ICD-10-CM | POA: Diagnosis not present

## 2020-09-27 DIAGNOSIS — Z430 Encounter for attention to tracheostomy: Secondary | ICD-10-CM | POA: Diagnosis present

## 2020-09-27 DIAGNOSIS — J449 Chronic obstructive pulmonary disease, unspecified: Secondary | ICD-10-CM | POA: Diagnosis not present

## 2020-09-27 NOTE — ED Triage Notes (Signed)
States his trach has been congested for 2 weeks

## 2020-09-27 NOTE — Discharge Instructions (Addendum)
Your xray is normal - no infection Your exam was normal and reassuring See your doctor this week if you continue to have problems ER for worsening symptoms. .  Thank you for letting us take care of you today!  Please obtain all of your results from medical records or have your doctors office obtain the results - share them with your doctor - you should be seen at your doctors office in the next 2 days. Call today to arrange your follow up. Take the medications as prescribed. Please review all of the medicines and only take them if you do not have an allergy to them. Please be aware that if you are taking birth control pills, taking other prescriptions, ESPECIALLY ANTIBIOTICS may make the birth control ineffective - if this is the case, either do not engage in sexual activity or use alternative methods of birth control such as condoms until you have finished the medicine and your family doctor says it is OK to restart them. If you are on a blood thinner such as COUMADIN, be aware that any other medicine that you take may cause the coumadin to either work too much, or not enough - you should have your coumadin level rechecked in next 7 days if this is the case.  ?  It is also a possibility that you have an allergic reaction to any of the medicines that you have been prescribed - Everybody reacts differently to medications and while MOST people have no trouble with most medicines, you may have a reaction such as nausea, vomiting, rash, swelling, shortness of breath. If this is the case, please stop taking the medicine immediately and contact your physician.   If you were given a medication in the ED such as percocet, vicodin, or morphine, be aware that these medicines are sedating and may change your ability to take care of yourself adequately for several hours after being given this medicines - you should not drive or take care of small children if you were given this medicine in the Emergency Department  or if you have been prescribed these types of medicines. ?   You should return to the ER IMMEDIATELY if you develop severe or worsening symptoms.
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
## 2020-10-06 ENCOUNTER — Ambulatory Visit (HOSPITAL_COMMUNITY)
Admission: RE | Admit: 2020-10-06 | Discharge: 2020-10-06 | Disposition: A | Discharge status: Home / Self Care | Payer: Medicaid Other | Source: Ambulatory Visit | Attending: Family Medicine | Admitting: Family Medicine

## 2020-10-06 ENCOUNTER — Other Ambulatory Visit: Payer: Self-pay

## 2020-10-06 DIAGNOSIS — K824 Cholesterolosis of gallbladder: Secondary | ICD-10-CM | POA: Insufficient documentation

## 2020-10-06 DIAGNOSIS — K769 Liver disease, unspecified: Secondary | ICD-10-CM | POA: Diagnosis present

## 2020-10-06 MED ORDER — GADOBUTROL 1 MMOL/ML IV SOLN
10.0000 mL | Freq: Once | INTRAVENOUS | Status: AC | PRN
  Administered 2020-10-06: 10 mL via INTRAVENOUS

## 2020-10-14 ENCOUNTER — Other Ambulatory Visit: Payer: Self-pay | Admitting: Family Medicine

## 2020-10-20 ENCOUNTER — Telehealth: Payer: Self-pay | Admitting: Family Medicine

## 2020-10-20 DIAGNOSIS — K824 Cholesterolosis of gallbladder: Secondary | ICD-10-CM

## 2020-10-20 NOTE — Telephone Encounter (Signed)
Pls call his mother, and let her know that I had discussed his ultrasound report which showed enlargement of the gallbladder polyp in a short tie. She recommends referral to [place] surgery for further evaluation and management , she does not anticipate/ plan to operate on him locally due to his chronic  lung conditions  I do not know that surgery is recommended at this time , but ist is best for her to have surgeon follow him about this, I have entered the referral

## 2020-10-27 NOTE — Telephone Encounter (Signed)
Mother aware and given the number to the surgeon

## 2020-11-02 ENCOUNTER — Telehealth: Payer: Self-pay | Admitting: Student

## 2020-11-02 MED ORDER — TORSEMIDE 20 MG PO TABS: 20.0000 mg | ORAL_TABLET | Freq: Two times a day (BID) | ORAL | 3 refills | Status: DC

## 2020-11-02 NOTE — Telephone Encounter (Signed)
Spoke with pt's mother who states that PCP sent in script for Torsemide 20 mg take one daily. Mother states that patient has been taking torsemide 20 mg two times daily as previously prescribed by [place], PA-C. Mother asking that a new script be sent in.  Spoke with Dr. Domenic Polite disused pt current and past dosages of Torsemide. Will call in 20 mg Torsemide BID.

## 2020-11-02 NOTE — Telephone Encounter (Signed)
New message   Patient mother called about script suppose to be 2x a day (this was back in sept 2021), but his PCP sent it in for 1x a day .   *STAT* If patient is at the pharmacy, call can be transferred to refill team.   1. Which medications need to be refilled? (please list name of each medication and dose if known) torsemide (DEMADEX) 20 MG tablet  2. Which pharmacy/location (including street and city if local pharmacy) is medication to be sent to?[facility] in [place]  3. Do they need a 30 day or 90 day supply?  68  Patient mother states that patient is down to 292 , he weighed 311 in July .

## 2020-11-04 ENCOUNTER — Other Ambulatory Visit: Payer: Self-pay

## 2020-11-04 ENCOUNTER — Encounter: Payer: Self-pay | Admitting: Pulmonary Disease

## 2020-11-04 ENCOUNTER — Ambulatory Visit (INDEPENDENT_AMBULATORY_CARE_PROVIDER_SITE_OTHER): Payer: Medicaid Other | Admitting: Pulmonary Disease

## 2020-11-04 VITALS — BP 130/88 | HR 69 | Temp 98.4°F | Ht 65.0 in | Wt 300.0 lb

## 2020-11-04 DIAGNOSIS — Z23 Encounter for immunization: Secondary | ICD-10-CM

## 2020-11-04 DIAGNOSIS — E662 Morbid (severe) obesity with alveolar hypoventilation: Secondary | ICD-10-CM | POA: Diagnosis not present

## 2020-11-04 DIAGNOSIS — Z930 Tracheostomy status: Secondary | ICD-10-CM

## 2020-11-04 DIAGNOSIS — G4733 Obstructive sleep apnea (adult) (pediatric): Secondary | ICD-10-CM

## 2020-11-04 MED ORDER — SILDENAFIL CITRATE 50 MG PO TABS: 50.0000 mg | ORAL_TABLET | Freq: Every day | ORAL | 0 refills | Status: DC | PRN
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
## 2020-11-04 NOTE — Patient Instructions (Signed)
Flu shot today Follow up in 6 months 

## 2020-11-06 ENCOUNTER — Telehealth: Payer: Self-pay | Admitting: Pulmonary Disease

## 2020-11-09 ENCOUNTER — Other Ambulatory Visit (HOSPITAL_COMMUNITY)
Admission: RE | Admit: 2020-11-09 | Discharge: 2020-11-09 | Disposition: A | Discharge status: Home / Self Care | Payer: Medicaid Other | Source: Ambulatory Visit | Attending: Family Medicine | Admitting: Family Medicine

## 2020-11-09 ENCOUNTER — Other Ambulatory Visit: Payer: Self-pay

## 2020-11-09 DIAGNOSIS — Z01812 Encounter for preprocedural laboratory examination: Secondary | ICD-10-CM | POA: Insufficient documentation

## 2020-11-09 DIAGNOSIS — Z20822 Contact with and (suspected) exposure to covid-19: Secondary | ICD-10-CM | POA: Insufficient documentation

## 2020-11-09 LAB — SARS CORONAVIRUS 2 (TAT 6-24 HRS): SARS Coronavirus 2: NEGATIVE

## 2020-11-09 NOTE — Telephone Encounter (Signed)
I have attempted to call the pt but no answer and was not able to LM on VM.   Will try back later.

## 2020-11-10 NOTE — Telephone Encounter (Signed)
Spoke with patient who is calling stating that he was prescribed sildenafil (VIAGRA) 50 MG tablet on Wednesday 11/04/2020 and he states that it is not working. Advised patient that it has not even been a week yet and that it is on a as needed basis. Advised patient that since it is not a daily medication it may take awhile to get into his system and we advise all of our patient's with any new medications to give it at least 2 weeks to get into their system. Advised him that if there is no change by beginnin of October to call back and we can reevaluate then. Patient expressed understanding. Nothing further needed at this time.

## 2020-11-11 ENCOUNTER — Other Ambulatory Visit: Payer: Self-pay

## 2020-11-11 ENCOUNTER — Ambulatory Visit (HOSPITAL_COMMUNITY)
Admission: RE | Admit: 2020-11-11 | Discharge: 2020-11-11 | Disposition: A | Discharge status: Home / Self Care | Payer: Medicaid Other | Source: Ambulatory Visit | Attending: Acute Care | Admitting: Acute Care

## 2020-11-11 DIAGNOSIS — Z430 Encounter for attention to tracheostomy: Secondary | ICD-10-CM | POA: Diagnosis present

## 2020-11-11 NOTE — Progress Notes (Signed)
Tracheostomy Procedure Note  Shawn Ramsey [id number] April 13, 1980  Pre Procedure Tracheostomy Information  Trach Brand: Shiley Size:  8.0 XLTUP Style: Uncuffed Secured by: Velcro   Procedure: Trach cleaning and Trach change    Post Procedure Tracheostomy Information  Trach Brand: Shiley Size:  8.0 XLTUP Style: Uncuffed Secured by: Velcro   Post Procedure Evaluation:  ETCO2 positive color change from yellow to purple : Yes.   Vital signs:VSS Patients current condition: stable Complications: No apparent complications Trach site exam: clean and dry Wound care done: 4 x 4 gauze drain gauze Patient did tolerate procedure well.   Education: none  Prescription needs: none    Additional needs: X2 trach collars and new PMV given to patient today

## 2020-11-16 ENCOUNTER — Other Ambulatory Visit: Payer: Self-pay | Admitting: Student

## 2020-11-24 ENCOUNTER — Other Ambulatory Visit: Payer: Self-pay | Admitting: Family Medicine

## 2020-12-01 ENCOUNTER — Telehealth: Payer: Self-pay | Admitting: Family Medicine

## 2020-12-01 NOTE — Telephone Encounter (Signed)
Pt called in with question about recent prescription , wants to speak with nurse about concerns

## 2020-12-03 ENCOUNTER — Telehealth: Payer: Self-pay | Admitting: Pulmonary Disease

## 2020-12-03 NOTE — Telephone Encounter (Addendum)
Patient called office about sildenafil that VS prescribed last month. States that it isnt working and wants know if VS has any other recs of what he can try.   Dr. Halford Chessman please advise

## 2020-12-03 NOTE — Telephone Encounter (Signed)
If this is in reference to sildenafil prescription, then he was informed that he would need to follow up with his PCP for further management of erectile dysfunction.

## 2020-12-03 NOTE — Telephone Encounter (Signed)
Called and spoke with Patient. Dr. Sood's recommendations given. Understanding stated. Nothing further at this time. 

## 2020-12-03 NOTE — Telephone Encounter (Signed)
Called pt no answer °

## 2020-12-04 ENCOUNTER — Telehealth: Payer: Self-pay | Admitting: Pulmonary Disease

## 2020-12-04 NOTE — Telephone Encounter (Signed)
I have called [place] [facility] and they stated that if they run the pulmicort through for the budesonide that it is covered by the insurance.  They did this and it went through and they will get this ready for th pt.     Medicaid ID #  [id number]  PA ID # [id number] From the call to [facility] about the PA today.    Nothing further is needed.

## 2020-12-07 NOTE — Telephone Encounter (Signed)
Said the viagra that dr soot prescribed isnt working and wanted something else

## 2020-12-07 NOTE — Telephone Encounter (Signed)
From Dr. Halford Chessman on 12/03/20 when patient called about viagra(sildenafil):   "If this is in reference to sildenafil prescription, then he was informed that he would need to follow up with his PCP for further management of erectile dysfunction."

## 2020-12-08 NOTE — Telephone Encounter (Signed)
Pt states understanding
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
## 2020-12-14 ENCOUNTER — Encounter: Payer: Self-pay | Admitting: Psychiatry

## 2020-12-14 ENCOUNTER — Other Ambulatory Visit: Payer: Self-pay

## 2020-12-14 ENCOUNTER — Telehealth (INDEPENDENT_AMBULATORY_CARE_PROVIDER_SITE_OTHER): Payer: Medicaid Other | Admitting: Psychiatry

## 2020-12-14 DIAGNOSIS — F79 Unspecified intellectual disabilities: Secondary | ICD-10-CM | POA: Diagnosis not present

## 2020-12-14 DIAGNOSIS — F330 Major depressive disorder, recurrent, mild: Secondary | ICD-10-CM

## 2020-12-14 MED ORDER — SERTRALINE HCL 100 MG PO TABS: 100.0000 mg | ORAL_TABLET | Freq: Every day | ORAL | 2 refills | Status: DC

## 2020-12-14 NOTE — Patient Instructions (Signed)
Increase sertraline 100 mg daily Next appointment 1/19 at 1:40

## 2020-12-15 ENCOUNTER — Ambulatory Visit (INDEPENDENT_AMBULATORY_CARE_PROVIDER_SITE_OTHER): Payer: Medicaid Other | Admitting: Family Medicine

## 2020-12-15 ENCOUNTER — Other Ambulatory Visit: Payer: Self-pay

## 2020-12-15 ENCOUNTER — Telehealth: Payer: Self-pay | Admitting: Pulmonary Disease

## 2020-12-15 ENCOUNTER — Ambulatory Visit (HOSPITAL_COMMUNITY)
Admission: RE | Admit: 2020-12-15 | Discharge: 2020-12-15 | Disposition: A | Discharge status: Home / Self Care | Payer: Medicaid Other | Source: Ambulatory Visit | Attending: Family Medicine | Admitting: Family Medicine

## 2020-12-15 ENCOUNTER — Encounter: Payer: Self-pay | Admitting: Family Medicine

## 2020-12-15 VITALS — BP 131/79 | HR 92 | Resp 18 | Ht 65.0 in | Wt 282.1 lb

## 2020-12-15 DIAGNOSIS — E1169 Type 2 diabetes mellitus with other specified complication: Secondary | ICD-10-CM

## 2020-12-15 DIAGNOSIS — I10 Essential (primary) hypertension: Secondary | ICD-10-CM

## 2020-12-15 DIAGNOSIS — G4733 Obstructive sleep apnea (adult) (pediatric): Secondary | ICD-10-CM

## 2020-12-15 DIAGNOSIS — M79605 Pain in left leg: Secondary | ICD-10-CM

## 2020-12-15 DIAGNOSIS — E785 Hyperlipidemia, unspecified: Secondary | ICD-10-CM

## 2020-12-15 DIAGNOSIS — G8929 Other chronic pain: Secondary | ICD-10-CM

## 2020-12-15 DIAGNOSIS — Z23 Encounter for immunization: Secondary | ICD-10-CM

## 2020-12-15 DIAGNOSIS — M5442 Lumbago with sciatica, left side: Secondary | ICD-10-CM | POA: Diagnosis present

## 2020-12-15 DIAGNOSIS — E669 Obesity, unspecified: Secondary | ICD-10-CM

## 2020-12-15 DIAGNOSIS — Z6841 Body Mass Index (BMI) 40.0 and over, adult: Secondary | ICD-10-CM

## 2020-12-15 DIAGNOSIS — R932 Abnormal findings on diagnostic imaging of liver and biliary tract: Secondary | ICD-10-CM

## 2020-12-15 MED ORDER — SILDENAFIL CITRATE 100 MG PO TABS: 100.0000 mg | ORAL_TABLET | Freq: Every day | ORAL | 1 refills | Status: DC | PRN

## 2020-12-15 NOTE — Telephone Encounter (Signed)
Explained to Ms. Minks that there is no pulmonary contraindication that would prevent him from wearing a seatbelt, and that for his safety he should always wear a seatbelt while driving.  As such, I informed her that I will not be able to write a letter to [facility].

## 2020-12-15 NOTE — Telephone Encounter (Signed)
Pt's mom called requesting a letter be done to not wear a seatbelt because of medical condition, i.e. trach for DMV.   Dr. Halford Chessman please advise

## 2020-12-15 NOTE — Patient Instructions (Signed)
F/U in  4 months, call if you need me sooner  Pneumonia 20 today  Increase viagra dose to 100 mg   You are referred to pain  clinic  X ray of back today  Lipid, cmp and eGFr, TSH and HBa1C today  It is important that you exercise regularly at least 30 minutes 5 times a week. If you develop chest pain, have severe difficulty breathing, or feel very tired, stop exercising immediately and seek medical attention    Think about what you will eat, plan ahead. Choose " clean, green, fresh or frozen" over canned, processed or packaged foods which are more sugary, salty and fatty. 70 to 75% of food eaten should be vegetables and fruit. Three meals at set times with snacks allowed between meals, but they must be fruit or vegetables. Aim to eat over a 12 hour period , example 7 am to 7 pm, and STOP after  your last meal of the day. Drink water,generally about 64 ounces per day, no other drink is as healthy. Fruit juice is best enjoyed in a healthy way, by EATING the fruit. Thanks for choosing [facility], we consider it a privelige to serve you.

## 2020-12-15 NOTE — Assessment & Plan Note (Signed)
ubcontroolled and persistent for years, rated at 8 daily refer to pain clinic

## 2020-12-16 ENCOUNTER — Encounter: Payer: Self-pay | Admitting: Acute Care

## 2020-12-16 ENCOUNTER — Encounter: Payer: Self-pay | Admitting: Family Medicine

## 2020-12-16 LAB — HEMOGLOBIN A1C
Est. average glucose Bld gHb Est-mCnc: 117 mg/dL
Hgb A1c MFr Bld: 5.7 % — ABNORMAL HIGH (ref 4.8–5.6)

## 2020-12-16 LAB — CMP14+EGFR
ALT: 15 IU/L (ref 0–44)
AST: 13 IU/L (ref 0–40)
Albumin/Globulin Ratio: 1.6 (ref 1.2–2.2)
Albumin: 5 g/dL (ref 4.0–5.0)
Alkaline Phosphatase: 101 IU/L (ref 44–121)
BUN/Creatinine Ratio: 13 (ref 9–20)
BUN: 12 mg/dL (ref 6–24)
Bilirubin Total: 0.4 mg/dL (ref 0.0–1.2)
CO2: 25 mmol/L (ref 20–29)
Calcium: 9.9 mg/dL (ref 8.7–10.2)
Chloride: 99 mmol/L (ref 96–106)
Creatinine, Ser: 0.93 mg/dL (ref 0.76–1.27)
Globulin, Total: 3.1 g/dL (ref 1.5–4.5)
Glucose: 94 mg/dL (ref 70–99)
Potassium: 4 mmol/L (ref 3.5–5.2)
Sodium: 142 mmol/L (ref 134–144)
Total Protein: 8.1 g/dL (ref 6.0–8.5)
eGFR: 106 mL/min/{1.73_m2} (ref >59)

## 2020-12-16 LAB — TSH: TSH: 1.94 u[IU]/mL (ref 0.450–4.500)

## 2020-12-16 LAB — LIPID PANEL
Chol/HDL Ratio: 4.3 ratio (ref 0.0–5.0)
Cholesterol, Total: 134 mg/dL (ref 100–199)
HDL: 31 mg/dL — ABNORMAL LOW (ref >39)
LDL Chol Calc (NIH): 84 mg/dL (ref 0–99)
Triglycerides: 99 mg/dL (ref 0–149)
VLDL Cholesterol Cal: 19 mg/dL (ref 5–40)

## 2020-12-16 NOTE — Progress Notes (Signed)
Shawn Ramsey     MRN: [id number]      DOB: 1981/01/28   HPI [name] is here for follow up and re-evaluation of chronic medical conditions, medication management and review of any available recent lab and radiology data.  Preventive health is updated, specifically  Cancer screening and Immunization.   Has upcoming surgery consult C/o uncontrolled and worsening LLE pain requests referral tto pain clinic [place] on weight loss to 250 pounds to have tracheostomy closed, and doing exceptionally well, he is applauded on this   ROS Denies recent fever or chills. Denies sinus pressure, nasal congestion, ear pain or sore throat. Denies chest congestion, productive cough or wheezing. Denies chest pains, palpitations and leg swelling Denies abdominal pain, nausea, vomiting,diarrhea or constipation.   Denies dysuria, frequency, hesitancy or incontinence.  Denies headaches, seizures,. Denies uncontrolled depression, anxiety or insomnia. Denies skin break down or rash.   PE  BP 131/79   Pulse 92   Resp 18   Ht 5\' 5"  (1.651 m)   Wt 282 lb 1.9 oz (128 kg)   SpO2 95%   BMI 46.95 kg/m   Patient alert and oriented and in no cardiopulmonary distress.  HEENT: No facial asymmetry, EOMI,     Neck supple .  Chest: Clear to auscultation bilaterally.  CVS: S1, S2 no murmurs, no S3.Regular rate.  ABD: Soft non tender.   Ext: No edema  MS: Decreased  ROM spine, shoulders, hips and knees.  Skin: Intact, no ulcerations or rash noted.  Psych: Good eye contact, normal affect. Memory intact not anxious or depressed appearing.  CNS: CN 2-12 intact, power,  normal throughout.no focal deficits noted.   Assessment & Plan  Leg pain, anterior, left ubcontroolled and persistent for years, rated at 8 daily refer to pain clinic  Essential hypertension Controlled, no change in medication DASH diet and commitment to daily physical activity for a minimum of 30 minutes discussed and  encouraged, as a part of hypertension management. The importance of attaining a healthy weight is also discussed.  BP/Weight 12/15/2020 11/04/2020 09/27/2020 09/01/2020 08/26/2020 08/12/2020 7/58/8325  Systolic BP 498 264 158 [phone] 881  Diastolic BP 79 88 90 68 80 70 71  Wt. (Lbs) 282.12 300.04 301 311 306.8 309 315  BMI 46.95 49.93 50.09 51.75 51.05 51.42 52.42  Some encounter information is confidential and restricted. Go to Review Flowsheets activity to see all data.       Low back pain with left-sided sciatica Reports chronic and uncontrolled LLE pain , requests referral to pain clinic, will update X ray of low back  Abnormal gallbladder ultrasound Has upcoming schedules appt with gen surg  Morbid obesity with BMI of 50.0-59.9, adult ([place]) Improved. Pt applauded on succesful weight loss through lifestyle change, and encouraged to continue same. Weight loss goal set for the next several months.   OSA (obstructive sleep apnea) Daily use being practiced and feels improved overall  Diabetes mellitus type 2 in obese [facility]) Shawn Ramsey is reminded of the importance of commitment to daily physical activity for 30 minutes or more, as able and the need to limit carbohydrate intake to 30 to 60 grams per meal to help with blood sugar control.   The need to take medication as prescribed, test blood sugar as directed, and to call between visits if there is a concern that blood sugar is uncontrolled is also discussed.   Shawn Ramsey is reminded of the importance of daily foot exam,  annual eye examination, and good blood sugar, blood pressure and cholesterol control.  Diabetic Labs Latest Ref Rng & Units 12/15/2020 07/28/2020 07/24/2020 07/15/2020 06/13/2020  HbA1c 4.8 - 5.6 % 5.7(H) - - 6.1(H) -  Microalbumin Not Estab. ug/mL - - - - -  Micro/Creat Ratio 0 - 29 mg/g creat - - - 6 -  Chol 100 - 199 mg/dL 134 - - 188 -  HDL >39 mg/dL 31(L) - - 29(L) -  Calc LDL 0 - 99 mg/dL 84 - - 127(H) -   Triglycerides 0 - 149 mg/dL 99 - - 178(H) -  Creatinine 0.76 - 1.27 mg/dL 0.93 0.76 0.89 0.66(L) 0.89   BP/Weight 12/15/2020 11/04/2020 09/27/2020 09/01/2020 08/26/2020 08/12/2020 3/33/8329  Systolic BP 191 660 600 459 977 414 239  Diastolic BP 79 88 90 68 80 70 71  Wt. (Lbs) 282.12 300.04 301 311 306.8 309 315  BMI 46.95 49.93 50.09 51.75 51.05 51.42 52.42  Some encounter information is confidential and restricted. Go to Review Flowsheets activity to see all data.   Foot/eye exam completion dates 01/09/2018 09/19/2016  Foot Form Completion Done Done   Marked improvement with weight loss, continue daily metformin     Dyslipidemia Hyperlipidemia:Low fat diet discussed and encouraged.   Lipid Panel  Lab Results  Component Value Date   CHOL 134 12/15/2020   HDL 31 (L) 12/15/2020   LDLCALC 84 12/15/2020   TRIG 99 12/15/2020   CHOLHDL 4.3 12/15/2020   Needs to increase exercise, no med change

## 2020-12-16 NOTE — Assessment & Plan Note (Signed)
Shawn Ramsey is reminded of the importance of commitment to daily physical activity for 30 minutes or more, as able and the need to limit carbohydrate intake to 30 to 60 grams per meal to help with blood sugar control.   The need to take medication as prescribed, test blood sugar as directed, and to call between visits if there is a concern that blood sugar is uncontrolled is also discussed.   Shawn Ramsey is reminded of the importance of daily foot exam, annual eye examination, and good blood sugar, blood pressure and cholesterol control.  Diabetic Labs Latest Ref Rng & Units 12/15/2020 07/28/2020 07/24/2020 07/15/2020 06/13/2020  HbA1c 4.8 - 5.6 % 5.7(H) - - 6.1(H) -  Microalbumin Not Estab. ug/mL - - - - -  Micro/Creat Ratio 0 - 29 mg/g creat - - - 6 -  Chol 100 - 199 mg/dL 134 - - 188 -  HDL >39 mg/dL 31(L) - - 29(L) -  Calc LDL 0 - 99 mg/dL 84 - - 127(H) -  Triglycerides 0 - 149 mg/dL 99 - - 178(H) -  Creatinine 0.76 - 1.27 mg/dL 0.93 0.76 0.89 0.66(L) 0.89   BP/Weight 12/15/2020 11/04/2020 09/27/2020 09/01/2020 08/26/2020 08/12/2020 5/52/0802  Systolic BP 233 612 244 975 300 511 021  Diastolic BP 79 88 90 68 80 70 71  Wt. (Lbs) 282.12 300.04 301 311 306.8 309 315  BMI 46.95 49.93 50.09 51.75 51.05 51.42 52.42  Some encounter information is confidential and restricted. Go to Review Flowsheets activity to see all data.   Foot/eye exam completion dates 01/09/2018 09/19/2016  Foot Form Completion Done Done   Marked improvement with weight loss, continue daily metformin

## 2020-12-16 NOTE — Assessment & Plan Note (Signed)
Controlled, no change in medication DASH diet and commitment to daily physical activity for a minimum of 30 minutes discussed and encouraged, as a part of hypertension management. The importance of attaining a healthy weight is also discussed.  BP/Weight 12/15/2020 11/04/2020 09/27/2020 09/01/2020 08/26/2020 08/12/2020 5/85/2778  Systolic BP 242 353 614 431 540 086 761  Diastolic BP 79 88 90 68 80 70 71  Wt. (Lbs) 282.12 300.04 301 311 306.8 309 315  BMI 46.95 49.93 50.09 51.75 51.05 51.42 52.42  Some encounter information is confidential and restricted. Go to Review Flowsheets activity to see all data.

## 2020-12-16 NOTE — Assessment & Plan Note (Signed)
Improved. Pt applauded on succesful weight loss through lifestyle change, and encouraged to continue same. Weight loss goal set for the next several months.  

## 2020-12-16 NOTE — Assessment & Plan Note (Signed)
Daily use being practiced and feels improved overall

## 2020-12-16 NOTE — Patient Instructions (Signed)
Driving   We have discussed the importance of wearing a seatbelt. I have advised Mr Woodcox to adjust the top strap under his arm OR consider adding a cushion to his top strap. He has agreed to try this.

## 2020-12-16 NOTE — Assessment & Plan Note (Signed)
Has upcoming schedules appt with gen surg

## 2020-12-16 NOTE — Progress Notes (Signed)
     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 He was having some difficulty with the top seat strap of his seat belt irritating his tracheostomy. We have talked about the importance of motor safety and he has agreed to try first a top seat strap cushion and if that doesn't work at least wear the top seat belt strap under his arm, but still across his chest. He has Not been cleared to ride without a seat belt.   Erick Colace ACNP-BC [place]

## 2020-12-16 NOTE — Assessment & Plan Note (Signed)
Reports chronic and uncontrolled LLE pain , requests referral to pain clinic, will update X ray of low back

## 2020-12-16 NOTE — Assessment & Plan Note (Signed)
Hyperlipidemia:Low fat diet discussed and encouraged.   Lipid Panel  Lab Results  Component Value Date   CHOL 134 12/15/2020   HDL 31 (L) 12/15/2020   LDLCALC 84 12/15/2020   TRIG 99 12/15/2020   CHOLHDL 4.3 12/15/2020   Needs to increase exercise, no med change

## 2020-12-21 ENCOUNTER — Telehealth: Payer: Self-pay | Admitting: Family Medicine

## 2020-12-21 NOTE — Telephone Encounter (Signed)
PT is requesting another note that he does not have to wear his seatbelt due to medical concerns.

## 2020-12-22 NOTE — Telephone Encounter (Signed)
Do you agree with him not wearing a seatbelt? Please advise

## 2020-12-24 NOTE — Telephone Encounter (Signed)
Pt aware.

## 2021-01-04 ENCOUNTER — Other Ambulatory Visit (HOSPITAL_COMMUNITY)
Admission: RE | Admit: 2021-01-04 | Discharge: 2021-01-04 | Disposition: A | Discharge status: Home / Self Care | Payer: Medicaid Other | Source: Ambulatory Visit | Attending: Gastroenterology | Admitting: Gastroenterology

## 2021-01-04 DIAGNOSIS — Z01818 Encounter for other preprocedural examination: Secondary | ICD-10-CM

## 2021-01-04 DIAGNOSIS — Z01812 Encounter for preprocedural laboratory examination: Secondary | ICD-10-CM | POA: Diagnosis present

## 2021-01-04 DIAGNOSIS — Z20822 Contact with and (suspected) exposure to covid-19: Secondary | ICD-10-CM | POA: Diagnosis not present

## 2021-01-05 ENCOUNTER — Other Ambulatory Visit: Payer: Self-pay

## 2021-01-06 ENCOUNTER — Other Ambulatory Visit: Payer: Self-pay

## 2021-01-06 ENCOUNTER — Encounter (HOSPITAL_COMMUNITY): Payer: Self-pay

## 2021-01-06 DIAGNOSIS — J449 Chronic obstructive pulmonary disease, unspecified: Secondary | ICD-10-CM | POA: Insufficient documentation

## 2021-01-06 DIAGNOSIS — R110 Nausea: Secondary | ICD-10-CM | POA: Diagnosis not present

## 2021-01-06 DIAGNOSIS — Z113 Encounter for screening for infections with a predominantly sexual mode of transmission: Secondary | ICD-10-CM | POA: Insufficient documentation

## 2021-01-06 DIAGNOSIS — I5032 Chronic diastolic (congestive) heart failure: Secondary | ICD-10-CM | POA: Insufficient documentation

## 2021-01-06 DIAGNOSIS — E1169 Type 2 diabetes mellitus with other specified complication: Secondary | ICD-10-CM | POA: Insufficient documentation

## 2021-01-06 DIAGNOSIS — I110 Hypertensive heart disease with heart failure: Secondary | ICD-10-CM | POA: Diagnosis not present

## 2021-01-06 DIAGNOSIS — R519 Headache, unspecified: Secondary | ICD-10-CM | POA: Diagnosis not present

## 2021-01-06 DIAGNOSIS — Z7984 Long term (current) use of oral hypoglycemic drugs: Secondary | ICD-10-CM | POA: Diagnosis not present

## 2021-01-06 DIAGNOSIS — J45909 Unspecified asthma, uncomplicated: Secondary | ICD-10-CM | POA: Insufficient documentation

## 2021-01-06 DIAGNOSIS — R059 Cough, unspecified: Secondary | ICD-10-CM | POA: Insufficient documentation

## 2021-01-06 DIAGNOSIS — R300 Dysuria: Secondary | ICD-10-CM | POA: Diagnosis present

## 2021-01-06 DIAGNOSIS — Z8616 Personal history of COVID-19: Secondary | ICD-10-CM | POA: Diagnosis not present

## 2021-01-06 LAB — SARS CORONAVIRUS 2 (TAT 6-24 HRS): SARS Coronavirus 2: NEGATIVE

## 2021-01-06 NOTE — ED Triage Notes (Signed)
POV from home with cc of possible food poisoning. He states "ive been feeling nauserated and think I have an aneurism in my brain because it hurts when I cough."  Has not thrown up .  Also wants to be checked to STD  "I wanna be checked for that thing down there for an STD because ive been nauserated and it just feels like it"

## 2021-01-07 ENCOUNTER — Ambulatory Visit (HOSPITAL_COMMUNITY)
Admission: RE | Admit: 2021-01-07 | Discharge: 2021-01-07 | Disposition: A | Discharge status: Home / Self Care | Payer: Medicaid Other | Source: Ambulatory Visit | Attending: Acute Care | Admitting: Acute Care

## 2021-01-07 ENCOUNTER — Ambulatory Visit: Payer: Medicaid Other | Admitting: Gastroenterology

## 2021-01-07 ENCOUNTER — Emergency Department (HOSPITAL_COMMUNITY)
Admission: EM | Admit: 2021-01-07 | Discharge: 2021-01-07 | Disposition: A | Discharge status: Home / Self Care | Payer: Medicaid Other | Attending: Emergency Medicine | Admitting: Emergency Medicine

## 2021-01-07 DIAGNOSIS — G4733 Obstructive sleep apnea (adult) (pediatric): Secondary | ICD-10-CM

## 2021-01-07 DIAGNOSIS — Z430 Encounter for attention to tracheostomy: Secondary | ICD-10-CM | POA: Insufficient documentation

## 2021-01-07 DIAGNOSIS — J4540 Moderate persistent asthma, uncomplicated: Secondary | ICD-10-CM | POA: Insufficient documentation

## 2021-01-07 DIAGNOSIS — E662 Morbid (severe) obesity with alveolar hypoventilation: Secondary | ICD-10-CM | POA: Insufficient documentation

## 2021-01-07 DIAGNOSIS — Z930 Tracheostomy status: Secondary | ICD-10-CM | POA: Diagnosis not present

## 2021-01-07 DIAGNOSIS — R300 Dysuria: Secondary | ICD-10-CM

## 2021-01-07 LAB — URINALYSIS, ROUTINE W REFLEX MICROSCOPIC
Bilirubin Urine: NEGATIVE
Glucose, UA: NEGATIVE mg/dL
Hgb urine dipstick: NEGATIVE
Ketones, ur: 5 mg/dL — AB
Leukocytes,Ua: NEGATIVE
Nitrite: NEGATIVE
Protein, ur: NEGATIVE mg/dL
Specific Gravity, Urine: 1.018 (ref 1.005–1.030)
pH: 5 (ref 5.0–8.0)

## 2021-01-07 LAB — GC/CHLAMYDIA PROBE AMP (~~LOC~~) NOT AT ARMC
Chlamydia: NEGATIVE
Comment: NEGATIVE
Comment: NORMAL
Neisseria Gonorrhea: NEGATIVE

## 2021-01-07 NOTE — Progress Notes (Addendum)
Tracheostomy Procedure Note  Shawn Ramsey [id number] [birth year]  Pre Procedure Tracheostomy Information  Trach Brand: Shiley Size:  8.0 xltup Style: Uncuffed Secured by: Velcro   Procedure:Trach change and trach cleaning     Post Procedure Tracheostomy Information  Trach Brand: Shiley Size:  8.0 xltup Style: Uncuffed Secured by: Velcro   Post Procedure Evaluation:  ETCO2 positive color change from yellow to purple : Yes.   Vital signs:VSS Patients current condition: stable Complications: No apparent complications Trach site exam: clean, dry Wound care done: 4 x 4 gauze drain Patient did tolerate procedure well.   Education: none  Prescription needs: none    Additional needs: New PMV given to patient today

## 2021-01-07 NOTE — Progress Notes (Signed)
[place] [facility]   Reason for visit Planned trach change  HPI 40 year old male w/ OHS/OSA and untreated sleep apnea ultimately required trach. Lives at home w/ mother. Has been working on wt loss. Presents today for planned trach change  He has been doing well from trach stand-point. Happy to see he is now in the 270 lb range down from 400 when we first met.   Ros Review of Systems  Constitutional:  Positive for weight loss. Negative for chills, fever and malaise/fatigue.  HENT: Negative.    Eyes: Negative.   Respiratory: Negative.    Cardiovascular: Negative.   Gastrointestinal: Negative.   Musculoskeletal: Negative.   Neurological: Negative.     Exam  General this is a 40 year old male he is ambulatory and in no distress HENT NCAT He has 8 prox XLT which is uncuffed. The trach site is unremarkable  Pulm clear  Card rrr Abd soft  Ext warm  Neuro intact  Trach change Lurline Idol was removed. Site inspected and was unremarkable. The new size 8 was placed over obturator and placement was confirmed via ETCO2 Pt tolerated well   Impression/plan  Trach dependence  MO OSA  OHS Mod persistent asthma   Discussion Doing well from trach stand-point. Even more happy to see his weight loss. I think that when he gets < 250 I think we could consider looking into starting capping trials & work towards sleep/titration study to see about CPAP. He would have to prove he could be compliant with it. I also think given how difficult his airway was per my remote discussion w/ Dr Constance Holster I would also like him to be seen by ENT for upper airway eval if we get to point we are considering decannulation    Plan Cont routine trach care ROV 12 weeks for trach change   Erick Colace ACNP-BC [place] Pager # [phone] OR # [phone] if no answer
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
## 2021-01-11 ENCOUNTER — Telehealth: Payer: Self-pay | Admitting: Family Medicine

## 2021-01-11 NOTE — Telephone Encounter (Signed)
Pt  and mom called in about a plasma paper.  The mother states that she brought paperwork in on behalf of the patient have signed stating if he can or can not donate plasma  Mother  states that she gave the papers directly to you.

## 2021-01-12 ENCOUNTER — Telehealth: Payer: Self-pay | Admitting: Family Medicine

## 2021-01-12 NOTE — Telephone Encounter (Signed)
Dr [name] said she put the paper back in the mail/fax tray and said it would be discussed at the next appt- a thorough medical screen has to be performed to be able to donate

## 2021-01-12 NOTE — Telephone Encounter (Signed)
Pt needs a refill of viagra

## 2021-01-13 ENCOUNTER — Other Ambulatory Visit: Payer: Self-pay

## 2021-01-13 MED ORDER — SILDENAFIL CITRATE 100 MG PO TABS: 100.0000 mg | ORAL_TABLET | Freq: Every day | ORAL | 1 refills | Status: DC | PRN

## 2021-01-13 NOTE — Telephone Encounter (Signed)
Fill sent

## 2021-01-22 ENCOUNTER — Other Ambulatory Visit: Payer: Self-pay | Admitting: Family Medicine

## 2021-02-04 ENCOUNTER — Telehealth: Payer: Self-pay | Admitting: Pulmonary Disease

## 2021-02-04 NOTE — Telephone Encounter (Signed)
Sent message to [employer]. From [employer] to clarify if we need to send this info or not.

## 2021-02-04 NOTE — Telephone Encounter (Signed)
Attn:  [facility], Jeryl [place], recovery ID:  [id number].  Ph:  [phone]. Fax:  [phone].  Ms. Labelle didn't get name of person that called but stated after speaking to someone, someone else called 5 minutes later.  Please advise.

## 2021-02-14 ENCOUNTER — Other Ambulatory Visit: Payer: Self-pay

## 2021-02-14 ENCOUNTER — Emergency Department (HOSPITAL_COMMUNITY): Payer: Medicaid Other

## 2021-02-14 ENCOUNTER — Encounter (HOSPITAL_COMMUNITY): Payer: Self-pay

## 2021-02-14 ENCOUNTER — Emergency Department (HOSPITAL_COMMUNITY)
Admission: EM | Admit: 2021-02-14 | Discharge: 2021-02-15 | Disposition: A | Discharge status: Home / Self Care | Payer: Medicaid Other | Attending: Emergency Medicine | Admitting: Emergency Medicine

## 2021-02-14 DIAGNOSIS — R059 Cough, unspecified: Secondary | ICD-10-CM | POA: Insufficient documentation

## 2021-02-14 DIAGNOSIS — I5032 Chronic diastolic (congestive) heart failure: Secondary | ICD-10-CM | POA: Diagnosis not present

## 2021-02-14 DIAGNOSIS — Z87891 Personal history of nicotine dependence: Secondary | ICD-10-CM | POA: Insufficient documentation

## 2021-02-14 DIAGNOSIS — E119 Type 2 diabetes mellitus without complications: Secondary | ICD-10-CM | POA: Insufficient documentation

## 2021-02-14 DIAGNOSIS — Z7951 Long term (current) use of inhaled steroids: Secondary | ICD-10-CM | POA: Insufficient documentation

## 2021-02-14 DIAGNOSIS — Z7984 Long term (current) use of oral hypoglycemic drugs: Secondary | ICD-10-CM | POA: Insufficient documentation

## 2021-02-14 DIAGNOSIS — R0981 Nasal congestion: Secondary | ICD-10-CM | POA: Diagnosis not present

## 2021-02-14 DIAGNOSIS — J449 Chronic obstructive pulmonary disease, unspecified: Secondary | ICD-10-CM | POA: Insufficient documentation

## 2021-02-14 DIAGNOSIS — R042 Hemoptysis: Secondary | ICD-10-CM

## 2021-02-14 DIAGNOSIS — I110 Hypertensive heart disease with heart failure: Secondary | ICD-10-CM | POA: Insufficient documentation

## 2021-02-14 DIAGNOSIS — J45909 Unspecified asthma, uncomplicated: Secondary | ICD-10-CM | POA: Insufficient documentation

## 2021-02-14 DIAGNOSIS — Z79899 Other long term (current) drug therapy: Secondary | ICD-10-CM | POA: Diagnosis not present

## 2021-02-14 DIAGNOSIS — Z8616 Personal history of COVID-19: Secondary | ICD-10-CM | POA: Diagnosis not present

## 2021-02-14 LAB — CBC WITH DIFFERENTIAL/PLATELET
Abs Immature Granulocytes: 0.1 K/uL — ABNORMAL HIGH (ref 0.00–0.07)
Basophils Absolute: 0 K/uL (ref 0.0–0.1)
Basophils Relative: 0 %
Eosinophils Absolute: 0 K/uL (ref 0.0–0.5)
Eosinophils Relative: 0 %
HCT: 42.2 % (ref 39.0–52.0)
Hemoglobin: 14 g/dL (ref 13.0–17.0)
Immature Granulocytes: 1 %
Lymphocytes Relative: 27 %
Lymphs Abs: 3.1 K/uL (ref 0.7–4.0)
MCH: 29.2 pg (ref 26.0–34.0)
MCHC: 33.2 g/dL (ref 30.0–36.0)
MCV: 88.1 fL (ref 80.0–100.0)
Monocytes Absolute: 0.6 K/uL (ref 0.1–1.0)
Monocytes Relative: 5 %
Neutro Abs: 7.5 K/uL (ref 1.7–7.7)
Neutrophils Relative %: 67 %
Platelets: 435 K/uL — ABNORMAL HIGH (ref 150–400)
RBC: 4.79 MIL/uL (ref 4.22–5.81)
RDW: 15.5 % (ref 11.5–15.5)
WBC: 11.3 K/uL — ABNORMAL HIGH (ref 4.0–10.5)
nRBC: 0 % (ref 0.0–0.2)

## 2021-02-14 LAB — BASIC METABOLIC PANEL
Anion gap: 10 (ref 5–15)
BUN: 17 mg/dL (ref 6–20)
CO2: 29 mmol/L (ref 22–32)
Calcium: 9.1 mg/dL (ref 8.9–10.3)
Chloride: 100 mmol/L (ref 98–111)
Creatinine, Ser: 0.92 mg/dL (ref 0.61–1.24)
GFR, Estimated: >60 mL/min (ref >60)
Glucose, Bld: 93 mg/dL (ref 70–99)
Potassium: 3.6 mmol/L (ref 3.5–5.1)
Sodium: 139 mmol/L (ref 135–145)

## 2021-02-14 LAB — I-STAT CHEM 8, ED
BUN: 20 mg/dL (ref 6–20)
Calcium, Ion: 1.11 mmol/L — ABNORMAL LOW (ref 1.15–1.40)
Chloride: 100 mmol/L (ref 98–111)
Creatinine, Ser: 0.8 mg/dL (ref 0.61–1.24)
Glucose, Bld: 90 mg/dL (ref 70–99)
HCT: 45 % (ref 39.0–52.0)
Hemoglobin: 15.3 g/dL (ref 13.0–17.0)
Potassium: 3.7 mmol/L (ref 3.5–5.1)
Sodium: 141 mmol/L (ref 135–145)
TCO2: 29 mmol/L (ref 22–32)

## 2021-02-14 MED ORDER — IOPAMIDOL (ISOVUE-370) INJECTION 76%
75.0000 mL | Freq: Once | INTRAVENOUS | Status: AC | PRN
  Administered 2021-02-14: 75 mL via INTRAVENOUS

## 2021-02-14 NOTE — Discharge Instructions (Addendum)
You were seen in the emergency department today for blood in your sputum.  While you were here you were evaluated by a pulmonologist as well as that lab work, and imaging of your neck and chest.  This is likely all from your influenza and irritation of the airway from coughing.  I am prescribing you an antibiotic called doxycycline that you will take twice a day for the next 5 days.  Please complete this fully.  Please return to the emergency department for increasing amounts of blood coming from your tracheostomy.  Please follow-up with Dr. Halford Chessman, your pulmonologist.  Please call him tomorrow to make a follow-up appointment.
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
## 2021-02-14 NOTE — ED Notes (Signed)
RT at bedside doing trach care and suctioning/lavage

## 2021-02-14 NOTE — ED Triage Notes (Signed)
Pt c/o "blood & meat" coming out of trach x1wk, advised he was recently hospitalized for flu, discharged w onset of symptoms. Pt able to speak in complete symptoms, NAD in triage.
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
## 2021-02-15 DIAGNOSIS — R042 Hemoptysis: Secondary | ICD-10-CM

## 2021-02-15 MED ORDER — DOXYCYCLINE HYCLATE 100 MG PO CAPS: 100.0000 mg | ORAL_CAPSULE | Freq: Two times a day (BID) | ORAL | 0 refills | Status: AC

## 2021-02-26 ENCOUNTER — Ambulatory Visit: Payer: Medicaid Other | Admitting: Family Medicine

## 2021-02-26 ENCOUNTER — Encounter: Payer: Self-pay | Admitting: Cardiology

## 2021-02-26 ENCOUNTER — Ambulatory Visit: Payer: Medicaid Other | Admitting: Cardiology
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
## 2021-03-05 ENCOUNTER — Other Ambulatory Visit: Payer: Self-pay | Admitting: Family Medicine

## 2021-03-09 ENCOUNTER — Telehealth: Payer: Self-pay | Admitting: Family Medicine

## 2021-03-09 ENCOUNTER — Other Ambulatory Visit: Payer: Self-pay

## 2021-03-09 MED ORDER — SILDENAFIL CITRATE 100 MG PO TABS: 100.0000 mg | ORAL_TABLET | Freq: Every day | ORAL | 1 refills | Status: DC | PRN
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
## 2021-03-09 NOTE — Telephone Encounter (Signed)
Refills sent

## 2021-03-09 NOTE — Telephone Encounter (Signed)
Pt needs refill on   sildenafil (VIAGRA) 100 MG tablet

## 2021-03-10 ENCOUNTER — Telehealth: Payer: Self-pay

## 2021-03-10 NOTE — Telephone Encounter (Signed)
Patients mother [name] called and states [employer] is still calling her asking for information regarding OV notes and appointment notes as well as face to face encounter dates and doctors name. Advised patient that I would check with Melissa form [employer] to see if there is anything we need to send over.  Sent a community message to [place] from [employer] on this.

## 2021-03-11 ENCOUNTER — Telehealth (INDEPENDENT_AMBULATORY_CARE_PROVIDER_SITE_OTHER): Payer: Medicaid Other | Admitting: Psychiatry

## 2021-03-11 ENCOUNTER — Other Ambulatory Visit: Payer: Self-pay

## 2021-03-11 ENCOUNTER — Encounter: Payer: Self-pay | Admitting: Psychiatry

## 2021-03-11 DIAGNOSIS — F79 Unspecified intellectual disabilities: Secondary | ICD-10-CM

## 2021-03-11 DIAGNOSIS — F330 Major depressive disorder, recurrent, mild: Secondary | ICD-10-CM | POA: Diagnosis not present

## 2021-03-11 MED ORDER — SERTRALINE HCL 100 MG PO TABS: 100.0000 mg | ORAL_TABLET | Freq: Every day | ORAL | 2 refills | Status: DC

## 2021-03-11 NOTE — Patient Instructions (Signed)
Continue sertraline 100 mg daily Next appointment 4/13 at 1:20

## 2021-03-15 ENCOUNTER — Other Ambulatory Visit: Payer: Self-pay | Admitting: Internal Medicine

## 2021-03-19 ENCOUNTER — Ambulatory Visit: Payer: Medicaid Other | Admitting: Pulmonary Disease

## 2021-03-23 ENCOUNTER — Telehealth: Payer: Self-pay

## 2021-03-23 NOTE — Telephone Encounter (Signed)
If its not in the -to be faxed box, nothing is back here either pending her signature. They will need to refax it

## 2021-03-23 NOTE — Telephone Encounter (Signed)
Received the fax, in box.

## 2021-03-23 NOTE — Telephone Encounter (Signed)
Nichole from [employer] called needs the follow up from 01.26.2023 CMN faxed to [phone]

## 2021-04-05 ENCOUNTER — Other Ambulatory Visit (HOSPITAL_COMMUNITY)
Admission: RE | Admit: 2021-04-05 | Discharge: 2021-04-05 | Disposition: A | Discharge status: Home / Self Care | Payer: Medicaid Other | Source: Ambulatory Visit | Attending: Family Medicine | Admitting: Family Medicine

## 2021-04-05 DIAGNOSIS — Z01812 Encounter for preprocedural laboratory examination: Secondary | ICD-10-CM | POA: Diagnosis not present

## 2021-04-05 DIAGNOSIS — Z20822 Contact with and (suspected) exposure to covid-19: Secondary | ICD-10-CM | POA: Insufficient documentation

## 2021-04-05 DIAGNOSIS — Z01818 Encounter for other preprocedural examination: Secondary | ICD-10-CM

## 2021-04-06 LAB — SARS CORONAVIRUS 2 (TAT 6-24 HRS): SARS Coronavirus 2: NEGATIVE

## 2021-04-07 ENCOUNTER — Other Ambulatory Visit: Payer: Self-pay

## 2021-04-07 ENCOUNTER — Ambulatory Visit (HOSPITAL_COMMUNITY)
Admission: RE | Admit: 2021-04-07 | Discharge: 2021-04-07 | Disposition: A | Discharge status: Home / Self Care | Payer: Medicaid Other | Source: Ambulatory Visit | Attending: Acute Care | Admitting: Acute Care

## 2021-04-07 DIAGNOSIS — F32A Depression, unspecified: Secondary | ICD-10-CM | POA: Diagnosis not present

## 2021-04-07 DIAGNOSIS — Z8616 Personal history of COVID-19: Secondary | ICD-10-CM | POA: Diagnosis not present

## 2021-04-07 DIAGNOSIS — E662 Morbid (severe) obesity with alveolar hypoventilation: Secondary | ICD-10-CM | POA: Insufficient documentation

## 2021-04-07 DIAGNOSIS — Z930 Tracheostomy status: Secondary | ICD-10-CM

## 2021-04-07 DIAGNOSIS — E119 Type 2 diabetes mellitus without complications: Secondary | ICD-10-CM | POA: Insufficient documentation

## 2021-04-07 DIAGNOSIS — Z430 Encounter for attention to tracheostomy: Secondary | ICD-10-CM | POA: Diagnosis not present

## 2021-04-07 DIAGNOSIS — J449 Chronic obstructive pulmonary disease, unspecified: Secondary | ICD-10-CM | POA: Diagnosis not present

## 2021-04-07 DIAGNOSIS — F88 Other disorders of psychological development: Secondary | ICD-10-CM | POA: Insufficient documentation

## 2021-04-07 DIAGNOSIS — F419 Anxiety disorder, unspecified: Secondary | ICD-10-CM | POA: Diagnosis not present

## 2021-04-07 NOTE — Progress Notes (Signed)
Tracheostomy Procedure Note  Shawn Ramsey [id number] 1980/05/11  Pre Procedure Tracheostomy Information  Trach Brand: Shiley Size:  8.0 XLTUP Style: Uncuffed Secured by: Velcro   Procedure: Trach change Trach cleaning and "RED" cap trial.   Post Procedure Tracheostomy Information Post trach change / trach was capped.  Patient monitored for  ten -15 minutes and walked with red cap as well by NP Trach Brand: Shiley Size:  8..0 XLTUP Style: Uncuffed Secured by: Velcro   Post Procedure Evaluation:  ETCO2 positive color change from yellow to purple : Yes.   Vital signs:VSS pre post and during RED cap trials Patients current condition: stable Complications: No apparent complications Trach site exam: clean, dry Wound care done: 4 x 4 gauze drain Patient did tolerate procedure well.   Education: How to wear and when to wear red cap.  Prescription needs: none    Additional needs:  3 additional red caps and new PMV given to patient at this visit.
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
## 2021-04-12 ENCOUNTER — Telehealth: Payer: Self-pay | Admitting: Family Medicine

## 2021-04-12 NOTE — Telephone Encounter (Signed)
Pt is calling to get viagra refilled

## 2021-04-13 NOTE — Telephone Encounter (Signed)
Patient aware that we will discuss at his next appt and to bring meds to his next appt

## 2021-04-16 ENCOUNTER — Other Ambulatory Visit: Payer: Self-pay | Admitting: Family Medicine

## 2021-04-20 ENCOUNTER — Ambulatory Visit (INDEPENDENT_AMBULATORY_CARE_PROVIDER_SITE_OTHER): Payer: Medicaid Other | Admitting: Family Medicine

## 2021-04-20 ENCOUNTER — Other Ambulatory Visit: Payer: Self-pay

## 2021-04-20 ENCOUNTER — Encounter: Payer: Self-pay | Admitting: Family Medicine

## 2021-04-20 VITALS — BP 123/76 | HR 77 | Resp 20 | Ht 65.0 in | Wt 267.1 lb

## 2021-04-20 DIAGNOSIS — E1169 Type 2 diabetes mellitus with other specified complication: Secondary | ICD-10-CM | POA: Diagnosis not present

## 2021-04-20 DIAGNOSIS — E669 Obesity, unspecified: Secondary | ICD-10-CM

## 2021-04-20 DIAGNOSIS — E785 Hyperlipidemia, unspecified: Secondary | ICD-10-CM

## 2021-04-20 DIAGNOSIS — N529 Male erectile dysfunction, unspecified: Secondary | ICD-10-CM | POA: Diagnosis not present

## 2021-04-20 DIAGNOSIS — I10 Essential (primary) hypertension: Secondary | ICD-10-CM

## 2021-04-20 MED ORDER — SILDENAFIL CITRATE 100 MG PO TABS: 50.0000 mg | ORAL_TABLET | Freq: Every day | ORAL | 5 refills | Status: DC | PRN

## 2021-04-20 NOTE — Progress Notes (Signed)
Shawn Ramsey     MRN: [id number]      DOB: 08-12-1980   HPI Shawn Ramsey is here for follow up and re-evaluation of chronic medical conditions, medication management and review of any available recent lab and radiology data.  Preventive health is updated, specifically  Cancer screening and Immunization.   Questions or concerns regarding consultations or procedures which the PT has had in the interim are  addressed. The PT denies any adverse reactions to current medications since the last visit.  Though reports ED medication is ineffective unwilling to try alternate meds  Follows closely with Psych and denies alcohol use  ROS Denies recent fever or chills. Denies sinus pressure, nasal congestion, ear pain or sore throat. Denies chest congestion, productive cough or wheezing. Denies chest pains, palpitations and leg swelling Denies abdominal pain, nausea, vomiting,diarrhea or constipation.   Denies dysuria, frequency, hesitancy or incontinence. Denies joint pain, swelling and limitation in mobility. Denies headaches, seizures, numbness, or tingling. Denies  uncontrolled depression, anxiety or insomnia. Denies skin break down or rash.   PE  BP 123/76    Pulse 77    Resp 20    Ht 5\' 5"  (1.651 m)    Wt 267 lb 1.9 oz (121.2 kg)    SpO2 98%    BMI 44.45 kg/m   Patient alert and oriented and in no cardiopulmonary distress.  HEENT: No facial asymmetry, EOMI,     Neck supple .  Chest: Clear to auscultation bilaterally.  CVS: S1, S2 no murmurs, no S3.Regular rate.  ABD: Soft non tender.   Ext: No edema  MS: Adequate ROM spine, shoulders, hips and knees.  Skin: Intact, no ulcerations or rash noted.  Psych: Good eye contact, normal affect. Memory intact not anxious or depressed appearing.  CNS: CN 2-12 intact, power,  normal throughout.no focal deficits noted.   Assessment & Plan  Morbid obesity due to excess calories (HCC) Marked weight loss, biggest incentive is getting  trach removed  Patient re-educated about  the importance of commitment to a  minimum of 150 minutes of exercise per week as able.  The importance of healthy food choices with portion control discussed, as well as eating regularly and within a 12 hour window most days. The need to choose "clean , green" food 50 to 75% of the time is discussed, as well as to make water the primary drink and set a goal of 64 ounces water daily.    Weight /BMI 04/20/2021 01/07/2021 01/06/2021  WEIGHT 267 lb 1.9 oz - 275 lb  HEIGHT 5\' 5"  - 5\' 5"   BMI 44.45 kg/m2 45.76 kg/m2 -  Some encounter information is confidential and restricted. Go to Review Flowsheets activity to see all data.      Essential hypertension DASH diet and commitment to daily physical activity for a minimum of 30 minutes discussed and encouraged, as a part of hypertension management. The importance of attaining a healthy weight is also discussed.  BP/Weight 04/20/2021 02/15/2021 01/07/2021 01/06/2021 12/15/2020 9/32/3557 04/23/2023  Systolic BP 427 062 376 - 283 151 761  Diastolic BP 76 80 66 - 79 88 90  Wt. (Lbs) 267.12 - - 275 282.12 300.04 301  BMI 44.45 - 45.76 - 46.95 49.93 50.09  Some encounter information is confidential and restricted. Go to Review Flowsheets activity to see all data.       Dyslipidemia Hyperlipidemia:Low fat diet discussed and encouraged.   Lipid Panel  Lab Results  Component Value Date  CHOL 134 12/15/2020   HDL 31 (L) 12/15/2020   LDLCALC 84 12/15/2020   TRIG 99 12/15/2020   CHOLHDL 4.3 12/15/2020     Controlled, no change in medication nees to increase exercise  Diabetes mellitus type 2 in obese [facility]) Shawn Ramsey is reminded of the importance of commitment to daily physical activity for 30 minutes or more, as able and the need to limit carbohydrate intake to 30 to 60 grams per meal to help with blood sugar control.   The need to take medication as prescribed, test blood sugar as directed, and  to call between visits if there is a concern that blood sugar is uncontrolled is also discussed.   Shawn Ramsey is reminded of the importance of daily foot exam, annual eye examination, and good blood sugar, blood pressure and cholesterol control.  Updated lab needed at/ before next visit.  Diabetic Labs Latest Ref Rng & Units 02/14/2021 02/14/2021 12/15/2020 07/28/2020 07/24/2020  HbA1c 4.8 - 5.6 % - - 5.7(H) - -  Microalbumin Not Estab. ug/mL - - - - -  Micro/Creat Ratio 0 - 29 mg/g creat - - - - -  Chol 100 - 199 mg/dL - - 134 - -  HDL >39 mg/dL - - 31(L) - -  Calc LDL 0 - 99 mg/dL - - 84 - -  Triglycerides 0 - 149 mg/dL - - 99 - -  Creatinine 0.61 - 1.24 mg/dL 0.92 0.80 0.93 0.76 0.89   BP/Weight 04/20/2021 02/15/2021 01/07/2021 01/06/2021 12/15/2020 11/12/1939 08/24/812  Systolic BP 481 856 314 - 970 263 785  Diastolic BP 76 80 66 - 79 88 90  Wt. (Lbs) 267.12 - - 275 282.12 300.04 301  BMI 44.45 - 45.76 - 46.95 49.93 50.09  Some encounter information is confidential and restricted. Go to Review Flowsheets activity to see all data.   Foot/eye exam completion dates 04/20/2021 01/09/2018  Foot Form Completion Done Done        ED (erectile dysfunction) Difficulty attaining and maintaining erection, viagra 100 mg daily as needed

## 2021-04-20 NOTE — Assessment & Plan Note (Signed)
DASH diet and commitment to daily physical activity for a minimum of 30 minutes discussed and encouraged, as a part of hypertension management. The importance of attaining a healthy weight is also discussed.  BP/Weight 04/20/2021 02/15/2021 01/07/2021 01/06/2021 12/15/2020 5/63/8937 04/25/2874  Systolic BP 811 572 620 - 355 974 163  Diastolic BP 76 80 66 - 79 88 90  Wt. (Lbs) 267.12 - - 275 282.12 300.04 301  BMI 44.45 - 45.76 - 46.95 49.93 50.09  Some encounter information is confidential and restricted. Go to Review Flowsheets activity to see all data.

## 2021-04-20 NOTE — Assessment & Plan Note (Signed)
Marked weight loss, biggest incentive is getting trach removed  Patient re-educated about  the importance of commitment to a  minimum of 150 minutes of exercise per week as able.  The importance of healthy food choices with portion control discussed, as well as eating regularly and within a 12 hour window most days. The need to choose "clean , green" food 50 to 75% of the time is discussed, as well as to make water the primary drink and set a goal of 64 ounces water daily.    Weight /BMI 04/20/2021 01/07/2021 01/06/2021  WEIGHT 267 lb 1.9 oz - 275 lb  HEIGHT 5\' 5"  - 5\' 5"   BMI 44.45 kg/m2 45.76 kg/m2 -  Some encounter information is confidential and restricted. Go to Review Flowsheets activity to see all data.

## 2021-04-20 NOTE — Patient Instructions (Addendum)
Annual exam in September, flu vaccine at visit, call if you need me sooner  Congrats on weight loss , keep it up  Lipid, cmp and eGFr , HBA1c today  It is important that you exercise regularly at least 30 minutes 5 times a week. If you develop chest pain, have severe difficulty breathing, or feel very tired, stop exercising immediately and seek medical attention ' Think about what you will eat, plan ahead. Choose " clean, green, fresh or frozen" over canned, processed or packaged foods which are more sugary, salty and fatty. 70 to 75% of food eaten should be vegetables and fruit. Three meals at set times with snacks allowed between meals, but they must be fruit or vegetables. Aim to eat over a 12 hour period , example 7 am to 7 pm, and STOP after  your last meal of the day. Drink water,generally about 64 ounces per day, no other drink is as healthy. Fruit juice is best enjoyed in a healthy way, by EATING the fruit.  Thanks for choosing [facility], we consider it a privelige to serve you.

## 2021-04-20 NOTE — Assessment & Plan Note (Signed)
Difficulty attaining and maintaining erection, viagra 100 mg daily as needed

## 2021-04-20 NOTE — Assessment & Plan Note (Signed)
Hyperlipidemia:Low fat diet discussed and encouraged.   Lipid Panel  Lab Results  Component Value Date   CHOL 134 12/15/2020   HDL 31 (L) 12/15/2020   LDLCALC 84 12/15/2020   TRIG 99 12/15/2020   CHOLHDL 4.3 12/15/2020     Controlled, no change in medication nees to increase exercise

## 2021-04-20 NOTE — Assessment & Plan Note (Signed)
[name] is reminded of the importance of commitment to daily physical activity for 30 minutes or more, as able and the need to limit carbohydrate intake to 30 to 60 grams per meal to help with blood sugar control.   The need to take medication as prescribed, test blood sugar as directed, and to call between visits if there is a concern that blood sugar is uncontrolled is also discussed.   [name] is reminded of the importance of daily foot exam, annual eye examination, and good blood sugar, blood pressure and cholesterol control.  Updated lab needed at/ before next visit.  Diabetic Labs Latest Ref Rng & Units 02/14/2021 02/14/2021 12/15/2020 07/28/2020 07/24/2020  HbA1c 4.8 - 5.6 % - - 5.7(H) - -  Microalbumin Not Estab. ug/mL - - - - -  Micro/Creat Ratio 0 - 29 mg/g creat - - - - -  Chol 100 - 199 mg/dL - - 134 - -  HDL >39 mg/dL - - 31(L) - -  Calc LDL 0 - 99 mg/dL - - 84 - -  Triglycerides 0 - 149 mg/dL - - 99 - -  Creatinine 0.61 - 1.24 mg/dL 0.92 0.80 0.93 0.76 0.89   BP/Weight 04/20/2021 02/15/2021 01/07/2021 01/06/2021 12/15/2020 06/14/5359 05/25/3152  Systolic BP 008 676 195 - 093 267 124  Diastolic BP 76 80 66 - 79 88 90  Wt. (Lbs) 267.12 - - 275 282.12 300.04 301  BMI 44.45 - 45.76 - 46.95 49.93 50.09  Some encounter information is confidential and restricted. Go to Review Flowsheets activity to see all data.   Foot/eye exam completion dates 04/20/2021 01/09/2018  Foot Form Completion Done Done

## 2021-04-22 ENCOUNTER — Other Ambulatory Visit: Payer: Self-pay

## 2021-04-22 ENCOUNTER — Ambulatory Visit (INDEPENDENT_AMBULATORY_CARE_PROVIDER_SITE_OTHER): Payer: Medicaid Other | Admitting: Pulmonary Disease

## 2021-04-22 ENCOUNTER — Encounter: Payer: Self-pay | Admitting: Pulmonary Disease

## 2021-04-22 VITALS — BP 136/88 | HR 88 | Temp 98.4°F | Ht 65.0 in | Wt 267.0 lb

## 2021-04-22 DIAGNOSIS — E662 Morbid (severe) obesity with alveolar hypoventilation: Secondary | ICD-10-CM

## 2021-04-22 DIAGNOSIS — J9611 Chronic respiratory failure with hypoxia: Secondary | ICD-10-CM | POA: Diagnosis not present

## 2021-04-22 DIAGNOSIS — G4733 Obstructive sleep apnea (adult) (pediatric): Secondary | ICD-10-CM | POA: Diagnosis not present

## 2021-04-22 DIAGNOSIS — J9612 Chronic respiratory failure with hypercapnia: Secondary | ICD-10-CM

## 2021-04-22 DIAGNOSIS — Z930 Tracheostomy status: Secondary | ICD-10-CM | POA: Diagnosis not present

## 2021-04-22 MED ORDER — ALBUTEROL SULFATE (2.5 MG/3ML) 0.083% IN NEBU: 2.5000 mg | INHALATION_SOLUTION | Freq: Four times a day (QID) | RESPIRATORY_TRACT | 5 refills | Status: DC | PRN

## 2021-04-22 MED ORDER — PULMICORT 0.25 MG/2ML IN SUSP: 0.2500 mg | Freq: Two times a day (BID) | RESPIRATORY_TRACT | 5 refills | Status: AC
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
## 2021-04-22 NOTE — Patient Instructions (Signed)
Pulmicort one vial nebulized in the morning and one vial nebulized in the evening ? ?Albuterol one vial every 6 hours as needed for cough, wheeze, chest congestion, or shortness of breath ? ?Will arrange for in lab sleep study at [facility] ? ?Will call to arrange for follow up after sleep study reviewed ? ?

## 2021-04-23 ENCOUNTER — Other Ambulatory Visit: Payer: Self-pay | Admitting: Family Medicine

## 2021-04-23 LAB — LIPID PANEL
Chol/HDL Ratio: 4 ratio (ref 0.0–5.0)
Cholesterol, Total: 147 mg/dL (ref 100–199)
HDL: 37 mg/dL — ABNORMAL LOW (ref >39)
LDL Chol Calc (NIH): 87 mg/dL (ref 0–99)
Triglycerides: 127 mg/dL (ref 0–149)
VLDL Cholesterol Cal: 23 mg/dL (ref 5–40)

## 2021-04-23 LAB — HEMOGLOBIN A1C
Est. average glucose Bld gHb Est-mCnc: 117 mg/dL
Hgb A1c MFr Bld: 5.7 % — ABNORMAL HIGH (ref 4.8–5.6)

## 2021-04-23 LAB — CMP14+EGFR
ALT: 12 IU/L (ref 0–44)
AST: 14 IU/L (ref 0–40)
Albumin/Globulin Ratio: 1.7 (ref 1.2–2.2)
Albumin: 4.6 g/dL (ref 4.0–5.0)
Alkaline Phosphatase: 74 IU/L (ref 44–121)
BUN/Creatinine Ratio: 18 (ref 9–20)
BUN: 13 mg/dL (ref 6–24)
Bilirubin Total: 0.3 mg/dL (ref 0.0–1.2)
CO2: 24 mmol/L (ref 20–29)
Calcium: 8.8 mg/dL (ref 8.7–10.2)
Chloride: 101 mmol/L (ref 96–106)
Creatinine, Ser: 0.73 mg/dL — ABNORMAL LOW (ref 0.76–1.27)
Globulin, Total: 2.7 g/dL (ref 1.5–4.5)
Glucose: 91 mg/dL (ref 70–99)
Potassium: 3.5 mmol/L (ref 3.5–5.2)
Sodium: 143 mmol/L (ref 134–144)
Total Protein: 7.3 g/dL (ref 6.0–8.5)
eGFR: 118 mL/min/{1.73_m2} (ref >59)

## 2021-05-11 ENCOUNTER — Telehealth: Payer: Self-pay

## 2021-05-11 ENCOUNTER — Other Ambulatory Visit: Payer: Self-pay

## 2021-05-11 MED ORDER — SILDENAFIL CITRATE 100 MG PO TABS: 100.0000 mg | ORAL_TABLET | Freq: Every day | ORAL | 1 refills | Status: DC | PRN

## 2021-05-11 NOTE — Telephone Encounter (Signed)
Patient called need med refill ? ?sildenafil (VIAGRA) 100 MG tablet  ? ?Request 10 pills ? ?[facility]: [name] ?
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
## 2021-05-17 ENCOUNTER — Ambulatory Visit: Payer: Medicaid Other | Admitting: Gastroenterology

## 2021-05-17 ENCOUNTER — Encounter: Payer: Self-pay | Admitting: Internal Medicine

## 2021-05-19 ENCOUNTER — Ambulatory Visit: Payer: Medicaid Other | Admitting: Pulmonary Disease
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
## 2021-06-03 ENCOUNTER — Telehealth (INDEPENDENT_AMBULATORY_CARE_PROVIDER_SITE_OTHER): Payer: Medicaid Other | Admitting: Psychiatry

## 2021-06-03 ENCOUNTER — Encounter: Payer: Self-pay | Admitting: Psychiatry

## 2021-06-03 DIAGNOSIS — F331 Major depressive disorder, recurrent, moderate: Secondary | ICD-10-CM | POA: Diagnosis not present

## 2021-06-03 DIAGNOSIS — F79 Unspecified intellectual disabilities: Secondary | ICD-10-CM | POA: Diagnosis not present

## 2021-06-03 MED ORDER — SERTRALINE HCL 100 MG PO TABS: 150.0000 mg | ORAL_TABLET | Freq: Every day | ORAL | 1 refills | Status: DC

## 2021-06-03 NOTE — Patient Instructions (Signed)
Increase sertraline 150 mg daily ?Next appointment: 6/12 at 2 PM, in person ? ?The next visit will be in person visit. Please arrive 15 mins before the scheduled time.  ? ?[facility]  ?Address: [address], [place], [place] [address]   ?

## 2021-06-09 ENCOUNTER — Ambulatory Visit: Payer: Medicaid Other | Admitting: Gastroenterology

## 2021-06-15 ENCOUNTER — Ambulatory Visit: Payer: Medicaid Other | Attending: Pulmonary Disease | Admitting: Pulmonary Disease

## 2021-06-15 DIAGNOSIS — Z930 Tracheostomy status: Secondary | ICD-10-CM

## 2021-06-15 DIAGNOSIS — G4733 Obstructive sleep apnea (adult) (pediatric): Secondary | ICD-10-CM

## 2021-06-15 DIAGNOSIS — E662 Morbid (severe) obesity with alveolar hypoventilation: Secondary | ICD-10-CM

## 2021-06-15 DIAGNOSIS — J9612 Chronic respiratory failure with hypercapnia: Secondary | ICD-10-CM

## 2021-06-15 DIAGNOSIS — J9611 Chronic respiratory failure with hypoxia: Secondary | ICD-10-CM

## 2021-06-16 ENCOUNTER — Telehealth: Payer: Self-pay | Admitting: Pulmonary Disease

## 2021-06-16 DIAGNOSIS — G4733 Obstructive sleep apnea (adult) (pediatric): Secondary | ICD-10-CM

## 2021-06-16 MED ORDER — TRAZODONE HCL 100 MG PO TABS: 100.0000 mg | ORAL_TABLET | Freq: Every evening | ORAL | 0 refills | Status: DC | PRN

## 2021-06-16 NOTE — Telephone Encounter (Signed)
I have sent script for trazodone 100 mg.  He can use this on the night of his study.  He shouldn't need to use this otherwise and no plan to refill this. ?

## 2021-06-16 NOTE — Telephone Encounter (Signed)
Pt's mother states as of 2 a.m. he had not fallen asleep to do the sleep study.  Was told to get with Dr. Halford Chessman and see next step.  Please advise.   ?  ? ?Patient had split study last night and was not able to sleep for study. Patients mother wanted to f/u with Dr. Halford Chessman ?

## 2021-06-16 NOTE — Telephone Encounter (Signed)
Tried calling pt's mother but no answer. ? ?Please let her know that sleep lab tech reported that [place] took a nap before going to the sleep lab, and this is probably why he wasn't able to sleep during the test. ? ?He needs to get sleep study rescheduled.  If he is agreeable to this, then please place order for a split night sleep study and test to be done with tracheostomy capped.  Please emphasis that he should not nap before going for his next sleep study.  If he wants, I can send in a prescription for a sleeping pill he can use on the night of his sleep study. ?

## 2021-06-16 NOTE — Procedures (Signed)
? ? ?  Patient Name: Shawn Ramsey, Shawn Ramsey ?Study Date: 06/15/2021 ?Gender: Male ?D.O.B: [birth year] ?Age (years): 47 ?Referring Provider: Chesley Mires MD, ABSM ?Height (inches): 65 ?Interpreting Physician: Chesley Mires MD, ABSM ?Weight (lbs): 265 ?RPSGT: Rosebud Poles ?BMI: 44 ?MRN: [id number] ?Neck Size: 23.00 ? ?CLINICAL INFORMATION ?Sleep Study Type: NPSG ? ?Indication for sleep study: History of severe obstructive sleep apnea with obesity hypoventilation syndrome and chronic tracheostomy.  he has lost significant amount of weight.  He presents to have assessment of current sleep apnea status.  Study performed with tracheostomy capped. ? ?Epworth Sleepiness Score: 4 ? ?Most recent polysomnogram dated 01/12/2015 revealed an AHI of 28/h and RDI of 28/h. Most recent titration study dated 11/18/2019 revealed an AHI of 101/h. ? ?SLEEP STUDY TECHNIQUE ?As per the AASM Manual for the Scoring of Sleep and Associated Events v2.3 (April 2016) with a hypopnea requiring 4% desaturations. ? ?The channels recorded and monitored were frontal, central and occipital EEG, electrooculogram (EOG), submentalis EMG (chin), nasal and oral airflow, thoracic and abdominal wall motion, anterior tibialis EMG, snore microphone, electrocardiogram, and pulse oximetry. ? ?MEDICATIONS ?Medications self-administered by patient taken the night of the study : N/A ? ?SLEEP ARCHITECTURE ?The study was initiated at 11:00:47 PM and ended at 1:30:26 AM. ? ?Sleep onset time was N/A minutes and the sleep efficiency was 0.0%. The total sleep time was 0 minutes. ? ?Stage REM latency was N/A minutes. ? ?The patient spent N/A% of the night in stage N1 sleep, N/A% in stage N2 sleep, N/A% in stage N3 and 0% in REM. ? ?Alpha intrusion was absent. ? ?Supine sleep was N/A%. ? ?RESPIRATORY PARAMETERS ?The overall apnea/hypopnea index (AHI) was N/A per hour. There were N/A total apneas, including N/A obstructive, N/A central and N/A mixed apneas. There were N/A hypopneas  and N/A RERAs. ? ?The AHI during Stage REM sleep was N/A per hour. ? ?AHI while supine was N/A per hour. ? ?The mean oxygen saturation was N/A%. The minimum SpO2 during sleep was N/A%. ? ?snoring was noted during this study. ? ?CARDIAC DATA ?The 2 lead EKG demonstrated sinus rhythm. The mean heart rate was N/A beats per minute. Other EKG findings include: None. ? ?LEG MOVEMENT DATA ?The total PLMS were N/A with a resulting PLMS index of N/A. Associated arousal with leg movement index was N/A . ? ?IMPRESSIONS ?- The patient reported taking a nap all day prior to arriving for sleep study.  He was not able to fall asleep during the study and requested to have the study stopped early. ? ?DIAGNOSIS ?- Obstructive sleep apnea ? ?RECOMMENDATIONS ?- He will need to have the sleep study reschedule.  He should not nap prior to having his repeat sleep study performed.  He might benefit from a sleep aide medication on the night of his repeat sleep study. ? ?[Electronically signed] 06/16/2021 02:12 PM ? ?Chesley Mires MD, ABSM ?Diplomate, Tax adviser of Sleep Medicine ?NPI: [id number] ? ?[facility] ?PH: [phone]   FX: [phone] ?ACCREDITED BY THE AMERICAN ACADEMY OF SLEEP MEDICINE ? ?

## 2021-06-16 NOTE — Telephone Encounter (Signed)
Called and spoke to [place] (ok per dpr) and she voiced understanding of recs per Dr. Halford Chessman. She states it is okay to proceed with placing the order and that she believes it would be beneficial for the patient to have medication to help him sleep the night of the sleep study.  ? ?

## 2021-06-17 ENCOUNTER — Other Ambulatory Visit: Payer: Self-pay | Admitting: Family Medicine

## 2021-06-18 NOTE — Telephone Encounter (Signed)
Called and notified [name]. She voiced understanding of no refills on medication. Nothing further needed  ?
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
## 2021-06-24 ENCOUNTER — Ambulatory Visit: Payer: Medicaid Other | Admitting: Gastroenterology

## 2021-06-24 ENCOUNTER — Encounter: Payer: Self-pay | Admitting: Gastroenterology

## 2021-06-24 VITALS — BP 138/74 | HR 82 | Temp 97.9°F | Ht 65.0 in | Wt 267.0 lb

## 2021-06-24 DIAGNOSIS — D1803 Hemangioma of intra-abdominal structures: Secondary | ICD-10-CM | POA: Diagnosis not present

## 2021-06-24 DIAGNOSIS — Z1211 Encounter for screening for malignant neoplasm of colon: Secondary | ICD-10-CM

## 2021-06-24 DIAGNOSIS — K824 Cholesterolosis of gallbladder: Secondary | ICD-10-CM | POA: Insufficient documentation

## 2021-06-24 NOTE — Patient Instructions (Signed)
We will arrange for you to have an ultrasound anything hospital to follow-up on the gallbladder polyp. ? ?As we discussed, the lesion on your liver is called a hemangioma.  This is a completely benign lesion. ? ?You will be due for a colonoscopy in 5 years.  ? ?We will follow-up with you in the office as needed.  Do not hesitate to call if you have any new GI concerns. ? ?It was great meeting you today!  ? ?Aliene Altes, PA-C ?[facility] ? ?

## 2021-06-28 ENCOUNTER — Ambulatory Visit (HOSPITAL_COMMUNITY)
Admission: RE | Admit: 2021-06-28 | Discharge: 2021-06-28 | Disposition: A | Discharge status: Home / Self Care | Payer: Medicaid Other | Source: Ambulatory Visit | Attending: Acute Care | Admitting: Acute Care

## 2021-06-28 DIAGNOSIS — Z930 Tracheostomy status: Secondary | ICD-10-CM | POA: Diagnosis present

## 2021-06-28 DIAGNOSIS — G4733 Obstructive sleep apnea (adult) (pediatric): Secondary | ICD-10-CM | POA: Insufficient documentation

## 2021-06-28 NOTE — Progress Notes (Signed)
Reason for visit  ?Planned trach change ? ?HPI ?41 year old male pt w/ severe OSA, trach dependence. I have been following him for trach management. Happy to report he has made significant improvement and lost a significant amount of weight to the point that he is now a candidate for decannulation. He did just have a scheduled sleep study however he had slept the entire day prior to the study so unfortunately could not sleep and was sent home at 1:30 am  ? ?Review of Systems  ?Constitutional:  Positive for weight loss.  ?HENT: Negative.    ?Eyes: Negative.   ?Respiratory: Negative.    ?Cardiovascular: Negative.   ?Gastrointestinal: Negative.   ?Genitourinary: Negative.   ?Musculoskeletal: Negative.   ?Skin: Negative.   ?Neurological: Negative.   ?Endo/Heme/Allergies: Negative.   ?Psychiatric/Behavioral: Negative.    ? ?Exam  ?General this is a 41 year old male who is ambulatory and presented to clinic today for planned change ?HENT NCAT 6 XLT unremarkable. Stoma site w/ circumferential granulation tissue but otherwise unremarkable  ?Pulm clear; dec bases ?Card rrr ?Abd soft  ?Ext warm  ?Neuro intact ? ?Procedure  ?The size 6 trach was removed. Site inspected and as noted above in the exam. New trach was inserted over obturator and placement was confirmed via ETCO2. Pt tolerated well  ? ?Impression/plan ?Trach dependence  ?OSA  ?Obesity  ? ?Discussion  ?The pt is ready for decannulation once he has his sleep study has been completed AND he has CPAP AND has demonstrated he can tolerate CPAP therapy.  ? ?Plan ?Cont routine trach care ?PMV and capping as tolerated ?PSG scheduled for next month  ?Will plan on seeing him in 10 weeks for trach change ->if has CPAP and tolerating it we may be able to decannulate.  ? ?My time 32 min ?Erick Colace ACNP-BC ?[place] ?Pager # [phone] OR # [phone] if no answer ? ? ?

## 2021-06-28 NOTE — Progress Notes (Addendum)
Tracheostomy Procedure Note ? ?Shawn Ramsey ?[id number] ?08-21-1980 ? ?Pre Procedure Tracheostomy Information ? ?Trach BrandCristy Hilts ?Size:  6.0 6XLTUP ?Style: Uncuffed ?Secured by: Velcro ? ? ?Procedure: Trach change and Trach cleaning ? ? ? ?Post Procedure Tracheostomy Information ? ?Trach BrandCristy Hilts ?Size:  60 6LXTUP ?Style: Uncuffed ?Secured by: Velcro ? ? ?Post Procedure Evaluation: ? ?ETCO2 positive color change from yellow to purple : Yes.   ?Vital signs:VSS ?Patients current condition: stable ?Complications: No apparent complications ?Trach site exam: clean, dry ?Wound care done: 4 x 4 gauze drain ?Patient did tolerate procedure well. ? ? ?Education: ?none ? ?Prescription needs: ?none ? ? ? ?Additional needs: ?New PMV given to  patient today ? ? ? ? ? ? ?  ?

## 2021-07-02 ENCOUNTER — Ambulatory Visit (HOSPITAL_COMMUNITY)
Admission: RE | Admit: 2021-07-02 | Discharge: 2021-07-02 | Disposition: A | Discharge status: Home / Self Care | Payer: Medicaid Other | Source: Ambulatory Visit | Attending: Gastroenterology | Admitting: Gastroenterology

## 2021-07-02 DIAGNOSIS — K824 Cholesterolosis of gallbladder: Secondary | ICD-10-CM | POA: Diagnosis not present

## 2021-07-07 ENCOUNTER — Ambulatory Visit (INDEPENDENT_AMBULATORY_CARE_PROVIDER_SITE_OTHER): Payer: Medicaid Other | Admitting: Family Medicine

## 2021-07-07 ENCOUNTER — Encounter: Payer: Self-pay | Admitting: Family Medicine

## 2021-07-07 VITALS — BP 116/73 | HR 96 | Resp 16 | Ht 65.0 in | Wt 274.1 lb

## 2021-07-07 DIAGNOSIS — I10 Essential (primary) hypertension: Secondary | ICD-10-CM | POA: Diagnosis not present

## 2021-07-07 DIAGNOSIS — F5104 Psychophysiologic insomnia: Secondary | ICD-10-CM | POA: Diagnosis not present

## 2021-07-07 DIAGNOSIS — E1169 Type 2 diabetes mellitus with other specified complication: Secondary | ICD-10-CM

## 2021-07-07 DIAGNOSIS — E669 Obesity, unspecified: Secondary | ICD-10-CM

## 2021-07-07 DIAGNOSIS — F321 Major depressive disorder, single episode, moderate: Secondary | ICD-10-CM

## 2021-07-07 MED ORDER — TRAZODONE HCL 150 MG PO TABS: 150.0000 mg | ORAL_TABLET | Freq: Every day | ORAL | 0 refills | Status: DC

## 2021-07-07 MED ORDER — WEGOVY 0.25 MG/0.5ML ~~LOC~~ SOAJ: 0.2500 mg | SUBCUTANEOUS | 1 refills | Status: DC

## 2021-07-07 NOTE — Patient Instructions (Signed)
F/U in 7 weeks, call if you need me sooner ? ?New higher dose trazodone 150 mg at bedtime ? ?New for weight loss is once weekly wegovy, please take metformin every day ? ?Non fasting cmp and EGFr and HBa1C 5 days before next visit ? ? ?Thanks for choosing [facility], we consider it a privelige to serve you. ? ?

## 2021-07-08 ENCOUNTER — Telehealth: Payer: Self-pay | Admitting: Family Medicine

## 2021-07-08 ENCOUNTER — Telehealth: Payer: Self-pay

## 2021-07-08 NOTE — Telephone Encounter (Signed)
Please call her about medication called in today. - she thinks it is not the correct med

## 2021-07-08 NOTE — Telephone Encounter (Signed)
Shawn Ramsey called needs to know ozempic mg of this medicine. The pharmacy said insurance will not cover this medicine cost $1600.00. please contact Shawn Ramsey back [phone]

## 2021-07-09 ENCOUNTER — Other Ambulatory Visit: Payer: Self-pay | Admitting: Family Medicine

## 2021-07-09 MED ORDER — OZEMPIC (0.25 OR 0.5 MG/DOSE) 2 MG/1.5ML ~~LOC~~ SOPN: 0.2500 mg | PEN_INJECTOR | SUBCUTANEOUS | 2 refills | Status: DC

## 2021-07-09 NOTE — Telephone Encounter (Signed)
Shawn Ramsey wasn't covered because he has medicaid. Needs to be changed to ozempic per his case manager

## 2021-07-11 ENCOUNTER — Encounter: Payer: Self-pay | Admitting: Family Medicine

## 2021-07-11 DIAGNOSIS — F321 Major depressive disorder, single episode, moderate: Secondary | ICD-10-CM | POA: Insufficient documentation

## 2021-07-11 DIAGNOSIS — G4700 Insomnia, unspecified: Secondary | ICD-10-CM | POA: Insufficient documentation

## 2021-07-11 NOTE — Assessment & Plan Note (Addendum)
Mr. Carcamo is reminded of the importance of commitment to daily physical activity for 30 minutes or more, as able and the need to limit carbohydrate intake to 30 to 60 grams per meal to help with blood sugar control.   Mr. Baranowski is reminded of the importance of daily foot exam, annual eye examination, and good blood sugar, blood pressure and cholesterol control.     Latest Ref Rng & Units 04/22/2021   11:24 AM 02/14/2021   10:59 PM 02/14/2021   10:53 PM 12/15/2020   11:58 AM 07/28/2020   12:17 PM  Diabetic Labs  HbA1c 4.8 - 5.6 % 5.7     5.7     Chol 100 - 199 mg/dL 147     134     HDL >39 mg/dL 37     31     Calc LDL 0 - 99 mg/dL 87     84     Triglycerides 0 - 149 mg/dL 127     99     Creatinine 0.76 - 1.27 mg/dL 0.73   0.92   0.80   0.93   0.76        07/07/2021    2:09 PM 06/24/2021    2:09 PM 04/22/2021   10:46 AM 04/20/2021   10:31 AM 02/15/2021   12:27 AM 02/14/2021    6:25 PM 02/14/2021    4:55 PM  BP/Weight  Systolic BP 254 982 641 583 094 076 808  Diastolic BP 73 74 88 76 80 75 86  Wt. (Lbs) 274.12 267 267 267.12     BMI 45.62 kg/m2 44.43 kg/m2 44.43 kg/m2 44.45 kg/m2         04/20/2021   10:20 AM 01/09/2018    4:00 PM  Foot/eye exam completion dates  Foot Form Completion Done Done

## 2021-07-11 NOTE — Assessment & Plan Note (Signed)
Uncontrolled Sleep hygiene reviewed and written information offered also. Prescription sent for  medication needed. Dose increase

## 2021-07-11 NOTE — Assessment & Plan Note (Signed)
  Patient re-educated about  the importance of commitment to a  minimum of 150 minutes of exercise per week as able.  The importance of healthy food choices with portion control discussed, as well as eating regularly and within a 12 hour window most days. The need to choose "clean , green" food 50 to 75% of the time is discussed, as well as to make water the primary drink and set a goal of 64 ounces water daily.       07/07/2021    2:09 PM 06/24/2021    2:09 PM 04/22/2021   10:46 AM  Weight /BMI  Weight 274 lb 1.9 oz 267 lb 267 lb  Height '5\' 5"'$  (1.651 m) '5\' 5"'$  (1.651 m) '5\' 5"'$  (1.651 m)  BMI 45.62 kg/m2 44.43 kg/m2 44.43 kg/m2

## 2021-07-11 NOTE — Assessment & Plan Note (Signed)
Managed by psych and controlled 

## 2021-07-11 NOTE — Progress Notes (Signed)
Shawn Ramsey     MRN: [id number]      DOB: 1980-08-08   HPI Shawn Ramsey is here for follow up and re-evaluation of chronic medical conditions, medication management and review of any available recent lab and radiology data.  Preventive health is updated, specifically  Cancer screening and Immunization.   Questions or concerns regarding consultations or procedures which the PT has had in the interim are  addressed. The PT denies any adverse reactions to current medications since the last visit.  C/o poor sleep and requests med for weight loss ROS Denies recent fever or chills. Denies sinus pressure, nasal congestion, ear pain or sore throat. Denies chest congestion, productive cough or wheezing. Denies chest pains, palpitations and leg swelling Denies abdominal pain, nausea, vomiting,diarrhea or constipation.   Denies dysuria, frequency, hesitancy or incontinence. Denies joint pain, swelling and limitation in mobility. Denies headaches, seizures, numbness, or tingling. Denies depression, anxiety  Denies skin break down or rash.   PE  BP 116/73   Pulse 96   Resp 16   Ht '5\' 5"'$  (1.651 m)   Wt 274 lb 1.9 oz (124.3 kg)   SpO2 96%   BMI 45.62 kg/m   Patient alert and oriented and in no cardiopulmonary distress.  HEENT: No facial asymmetry, EOMI,     Neck supple .  Chest: Clear to auscultation bilaterally.  CVS: S1, S2 no murmurs, no S3.Regular rate.  ABD: Soft non tender.   Ext: No edema  MS: Adequate ROM spine, shoulders, hips and knees.  Skin: Intact, no ulcerations or rash noted.  Psych: Good eye contact, normal affect. Memory intact not anxious or depressed appearing.  CNS: CN 2-12 intact, power,  normal throughout.no focal deficits noted.   Assessment & Plan  Diabetes mellitus type 2 in obese [facility]) Shawn Ramsey is reminded of the importance of commitment to daily physical activity for 30 minutes or more, as able and the need to limit carbohydrate intake to 30  to 60 grams per meal to help with blood sugar control.   Shawn Ramsey is reminded of the importance of daily foot exam, annual eye examination, and good blood sugar, blood pressure and cholesterol control.     Latest Ref Rng & Units 04/22/2021   11:24 AM 02/14/2021   10:59 PM 02/14/2021   10:53 PM 12/15/2020   11:58 AM 07/28/2020   12:17 PM  Diabetic Labs  HbA1c 4.8 - 5.6 % 5.7     5.7     Chol 100 - 199 mg/dL 147     134     HDL >39 mg/dL 37     31     Calc LDL 0 - 99 mg/dL 87     84     Triglycerides 0 - 149 mg/dL 127     99     Creatinine 0.76 - 1.27 mg/dL 0.73   0.92   0.80   0.93   0.76        07/07/2021    2:09 PM 06/24/2021    2:09 PM 04/22/2021   10:46 AM 04/20/2021   10:31 AM 02/15/2021   12:27 AM 02/14/2021    6:25 PM 02/14/2021    4:55 PM  BP/Weight  Systolic BP 448 185 631 497 026 378 588  Diastolic BP 73 74 88 76 80 75 86  Wt. (Lbs) 274.12 267 267 267.12     BMI 45.62 kg/m2 44.43 kg/m2 44.43 kg/m2 44.45 kg/m2  04/20/2021   10:20 AM 01/09/2018    4:00 PM  Foot/eye exam completion dates  Foot Form Completion Done Done        Morbid obesity due to excess calories [facility])  Patient re-educated about  the importance of commitment to a  minimum of 150 minutes of exercise per week as able.  The importance of healthy food choices with portion control discussed, as well as eating regularly and within a 12 hour window most days. The need to choose "clean , green" food 50 to 75% of the time is discussed, as well as to make water the primary drink and set a goal of 64 ounces water daily.       07/07/2021    2:09 PM 06/24/2021    2:09 PM 04/22/2021   10:46 AM  Weight /BMI  Weight 274 lb 1.9 oz 267 lb 267 lb  Height '5\' 5"'$  (1.651 m) '5\' 5"'$  (1.651 m) '5\' 5"'$  (1.651 m)  BMI 45.62 kg/m2 44.43 kg/m2 44.43 kg/m2      Insomnia Uncontrolled Sleep hygiene reviewed and written information offered also. Prescription sent for  medication needed. Dose increase   Depression,  major, single episode, moderate ([place]) Managed by psych and controlled

## 2021-07-22 ENCOUNTER — Other Ambulatory Visit: Payer: Self-pay | Admitting: Family Medicine

## 2021-07-23 ENCOUNTER — Ambulatory Visit: Payer: Medicaid Other | Attending: Pulmonary Disease | Admitting: Pulmonary Disease

## 2021-07-23 DIAGNOSIS — G4733 Obstructive sleep apnea (adult) (pediatric): Secondary | ICD-10-CM | POA: Diagnosis not present

## 2021-07-26 ENCOUNTER — Telehealth: Payer: Self-pay | Admitting: Pulmonary Disease

## 2021-07-26 DIAGNOSIS — G4733 Obstructive sleep apnea (adult) (pediatric): Secondary | ICD-10-CM | POA: Diagnosis not present

## 2021-07-26 NOTE — Procedures (Signed)
Patient Name: Shawn Ramsey, Shawn Ramsey Date: 07/23/2021 Gender: Male D.O.B: 11-03-1980 Age (years): 52 Referring Provider: Chesley Mires MD, ABSM Height (inches): 34 Interpreting Physician: Chesley Mires MD, ABSM Weight (lbs): 265 RPSGT: Peak, Robert BMI: 46 MRN: [id number] Neck Size: 23.00  CLINICAL INFORMATION Sleep Study Type: NPSG  Indication for sleep study: He has history of obstructive sleep apnea, obesity hypoventilation syndrome and chronic respiratory failure with tracheostomy placement in December 2021.  He has lost 120 lbs since then.  AHI from sleep study in July 2014 was 119.  He presents to have reassessment of sleep apnea status.  Epworth Sleepiness Score: 4  SLEEP STUDY TECHNIQUE As per the AASM Manual for the Scoring of Sleep and Associated Events v2.3 (April 2016) with a hypopnea requiring 4% desaturations.  The channels recorded and monitored were frontal, central and occipital EEG, electrooculogram (EOG), submentalis EMG (chin), nasal and oral airflow, thoracic and abdominal wall motion, anterior tibialis EMG, snore microphone, electrocardiogram, and pulse oximetry.  MEDICATIONS Medications self-administered by patient taken the night of the study : N/A  SLEEP ARCHITECTURE The study was initiated at 9:33:32 PM and ended at 5:01:43 AM.  Sleep onset time was 31.4 minutes and the sleep efficiency was 53.9%. The total sleep time was 241.5 minutes.  Stage REM latency was 193.0 minutes.  The patient spent 11.80% of the night in stage N1 sleep, 83.64% in stage N2 sleep, 0.00% in stage N3 and 4.6% in REM.  Alpha intrusion was absent.  Supine sleep was 0.00%.  RESPIRATORY PARAMETERS The overall apnea/hypopnea index (AHI) was 10.2 per hour. There were 17 total apneas, including 17 obstructive, 0 central and 0 mixed apneas. There were 24 hypopneas and 0 RERAs.  The AHI during Stage REM sleep was 43.6 per hour.  AHI while supine was N/A per hour.  The  mean oxygen saturation was 94.24%. The minimum SpO2 during sleep was 79.00%.  Loud snoring was noted during this study. He didn't meet split night study criteria due to insufficient sleep and respiratory events in the first portion of the study.  CARDIAC DATA The 2 lead EKG demonstrated sinus rhythm. The mean heart rate was 59.67 beats per minute. Other EKG findings include: None.  LEG MOVEMENT DATA The total PLMS were 0 with a resulting PLMS index of 0.00. Associated arousal with leg movement index was 0.0 .  IMPRESSIONS - Study performed with tracheostomy capped.  - Mild obstructive sleep apnea occurred during this study (AHI = 10.2/h). - Moderate oxygen desaturation was noted during this study (Min O2 = 79.00%). - He did not require the use of supplemental oxygen during this study. - The patient snored with loud snoring volume.  DIAGNOSIS - Obstructive Sleep Apnea (G47.33)  RECOMMENDATIONS - He is ready to retry CPAP therapy again.  He should be scheduled for a CPAP titration study with his tracheostomy capped.  If he does well with this, then he should be a candidate for tracheostomy decannulation in the near future. - Avoid alcohol, sedatives and other CNS depressants that may worsen sleep apnea and disrupt normal sleep architecture. - Sleep hygiene should be reviewed to assess factors that may improve sleep quality. - Weight management and regular exercise should be initiated or continued if appropriate.  [Electronically signed] 07/26/2021 02:57 PM  Chesley Mires MD, ABSM Diplomate, American Board of Sleep Medicine NPI: [id number]  [place] PH: [phone]   FX: [phone] [place]

## 2021-07-26 NOTE — Telephone Encounter (Signed)
Called and spoke with patient and he voiced understanding. He states that he has discussed getting getting trach out with Dr. Laurey Arrow and he has similar plan. Order placed for cpap titration and nothing further needed at this time.

## 2021-07-26 NOTE — Telephone Encounter (Signed)
PSG 07/23/21 >> done with trach capped.  AHI 10.2, SpO2 low 79%.   Please let him know his sleep study shows he still has sleep apnea, but much better than before since he has lost weight.  He is ready to try CPAP set up again.  He needs to be scheduled for a CPAP titration study and have his tracheostomy capped during this study.  If he does well with CPAP set up, then we can work with Dr. Laurey Arrow to get his tracheostomy removed at some point in the near future.
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
## 2021-08-02 ENCOUNTER — Ambulatory Visit: Payer: Medicaid Other | Admitting: Psychiatry

## 2021-08-10 ENCOUNTER — Ambulatory Visit (INDEPENDENT_AMBULATORY_CARE_PROVIDER_SITE_OTHER): Payer: Medicaid Other | Admitting: Psychiatry

## 2021-08-10 ENCOUNTER — Other Ambulatory Visit: Payer: Self-pay | Admitting: Psychiatry

## 2021-08-10 ENCOUNTER — Encounter: Payer: Self-pay | Admitting: Psychiatry

## 2021-08-10 VITALS — BP 118/72 | HR 93 | Temp 98.7°F | Ht 64.25 in | Wt 267.0 lb

## 2021-08-10 DIAGNOSIS — F331 Major depressive disorder, recurrent, moderate: Secondary | ICD-10-CM | POA: Diagnosis not present

## 2021-08-10 DIAGNOSIS — F1010 Alcohol abuse, uncomplicated: Secondary | ICD-10-CM | POA: Diagnosis not present

## 2021-08-10 DIAGNOSIS — F79 Unspecified intellectual disabilities: Secondary | ICD-10-CM | POA: Diagnosis not present

## 2021-08-10 MED ORDER — NALTREXONE HCL 50 MG PO TABS: 25.0000 mg | ORAL_TABLET | Freq: Every day | ORAL | 1 refills | Status: DC

## 2021-08-10 MED ORDER — SERTRALINE HCL 100 MG PO TABS: 200.0000 mg | ORAL_TABLET | Freq: Every day | ORAL | 1 refills | Status: DC
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
## 2021-08-10 NOTE — Patient Instructions (Addendum)
Increase sertraline 200 mg daily Start naltrexone 25 mg at night  Next appointment: 7/27 at 3 PM, in person

## 2021-08-17 ENCOUNTER — Ambulatory Visit: Payer: Medicaid Other | Attending: Pulmonary Disease | Admitting: Pulmonary Disease

## 2021-08-17 ENCOUNTER — Ambulatory Visit: Payer: Medicaid Other | Admitting: Psychiatry

## 2021-08-17 DIAGNOSIS — G4733 Obstructive sleep apnea (adult) (pediatric): Secondary | ICD-10-CM | POA: Insufficient documentation

## 2021-08-18 ENCOUNTER — Other Ambulatory Visit: Payer: Self-pay | Admitting: Family Medicine

## 2021-08-19 ENCOUNTER — Telehealth: Payer: Self-pay | Admitting: Pulmonary Disease

## 2021-08-19 ENCOUNTER — Ambulatory Visit (INDEPENDENT_AMBULATORY_CARE_PROVIDER_SITE_OTHER): Payer: Medicaid Other | Admitting: Family Medicine

## 2021-08-19 ENCOUNTER — Encounter: Payer: Self-pay | Admitting: Family Medicine

## 2021-08-19 DIAGNOSIS — G8929 Other chronic pain: Secondary | ICD-10-CM

## 2021-08-19 DIAGNOSIS — G4733 Obstructive sleep apnea (adult) (pediatric): Secondary | ICD-10-CM

## 2021-08-19 DIAGNOSIS — M25562 Pain in left knee: Secondary | ICD-10-CM | POA: Diagnosis not present

## 2021-08-19 NOTE — Assessment & Plan Note (Signed)
Pain rated at  7, and c/o stiffness , needs Ortho eval, referred

## 2021-08-19 NOTE — Telephone Encounter (Signed)
Spoke with patient regarding  results. They verbalized understanding. Patient was made aware to cap trach while using cpap and patients mom was made aware as well. Offered to make 4 month follow up visit but patient asked that we make the appt for beginning of Nov. Will place a recall. Order for cpap placed.  No further questions.  Nothing further needed at this time.

## 2021-08-19 NOTE — Telephone Encounter (Signed)
CPAP 08/17/21 >> CPAP 8 cm H2O >> AHI 0, no R or S.  Didn't need oxygen.  Trach capped.   Please let him know he did okay with CPAP during his sleep study.  Please send order to get him started on Resmed auto CPAP 7 to 18 cm H2O with heated humidity and mask of choice.  He needs to keep his tracheostomy capped when asleep while using CPAP.  He needs follow up in 4 months.  Please let him know I will be communicating with Marni Griffon at trach clinic about plan to start CPAP, and Laurey Arrow will coordinate when he can be assessed to have his tracheostomy removed.

## 2021-08-19 NOTE — Progress Notes (Signed)
Virtual Visit via Video Note  I connected with Shawn Ramsey on 08/19/21 at  8:40 AM EDT by a video enabled telemedicine application and verified that I am speaking with the correct person using two identifiers.  Location: Patient: home Provider: office   I discussed the limitations of evaluation and management by telemedicine and the availability of in person appointments. The patient expressed understanding and agreed to proceed.  History of Present Illness: C/o pain in left knee , and bone pops, no falls, or near falls, knee is stiff, pain  keeps him up and awakens him at times   Observations/Objective: There were no vitals taken for this visit. Good communication with no confusion and intact memory. Alert and oriented x 3 No signs of respiratory distress during speech   Assessment and Plan:  Left knee pain Pain rated at  7, and c/o stiffness , needs Ortho eval, referred  Follow Up Instructions:    I discussed the assessment and treatment plan with the patient. The patient was provided an opportunity to ask questions and all were answered. The patient agreed with the plan and demonstrated an understanding of the instructions.   The patient was advised to call back or seek an in-person evaluation if the symptoms worsen or if the condition fails to improve as anticipated.  I provided 7 minutes of non-face-to-face time during this encounter.   Tula Nakayama, MD

## 2021-08-19 NOTE — Procedures (Signed)
Patient Name: Shawn Ramsey, Shawn Ramsey Date: 08/17/2021 Gender: Male D.O.B: 04-09-1980 Age (years): 33 Referring Provider: Chesley Mires MD, ABSM Height (inches): 65 Interpreting Physician: Chesley Mires MD, ABSM Weight (lbs): 265 RPSGT: Rosebud Poles BMI: 44 MRN: [id number] Neck Size: 23.00  CLINICAL INFORMATION The patient is referred for a CPAP titration to treat sleep apnea. He had history of respiratory and sleep apnea treated with tracheostomy. He has significant amount of weight. Study done with his tracheostomy capped.  Date of NPSG 09/05/12: AHI 119.  SLEEP STUDY TECHNIQUE As per the AASM Manual for the Scoring of Sleep and Associated Events v2.3 (April 2016) with a hypopnea requiring 4% desaturations.  The channels recorded and monitored were frontal, central and occipital EEG, electrooculogram (EOG), submentalis EMG (chin), nasal and oral airflow, thoracic and abdominal wall motion, anterior tibialis EMG, snore microphone, electrocardiogram, and pulse oximetry. Continuous positive airway pressure (CPAP) was initiated at the beginning of the study and titrated to treat sleep-disordered breathing.  MEDICATIONS Medications self-administered by patient taken the night of the study : N/A  TECHNICIAN COMMENTS Comments added by technician: CPAP therapy started at 5 CWP and increased to 8 CWP. Titration increased due to snoring events. Patient awakened several times to clean out his trach. Patient asked if he could take another Trazodone around 1 am. Patient requested to end study due to sleep maintence. Study D/C'd upon patients' request. Suboptimal pressure obtained due to REM- supine stages did not occur. CPAP study performed with Trach. capped Comments added by scorer: N/A  RESPIRATORY PARAMETERS Optimal PAP Pressure (cm): 8 AHI at Optimal Pressure (/hr): 0 Overall Minimal O2 (%): 90.00 Supine % at Optimal Pressure (%): 30 Minimal O2 at Optimal Pressure  (%): 90.0   SLEEP ARCHITECTURE The study was initiated at 11:17:21 PM and ended at 5:24:42 AM.  Sleep onset time was 23.8 minutes and the sleep efficiency was 42.9%. The total sleep time was 157.5 minutes.  The patient spent 8.57% of the night in stage N1 sleep, 91.43% in stage N2 sleep, 0.00% in stage N3 and 0% in REM.Stage REM latency was N/A minutes  Wake after sleep onset was 186.1. Alpha intrusion was absent. Supine sleep was 9.73%. He had trouble falling asleep, and staying asleep. He requested to have the study stopped earlier because of difficulty maintaining sleep.  CARDIAC DATA The 2 lead EKG demonstrated sinus rhythm. The mean heart rate was 60.39 beats per minute. Other EKG findings include: None.  LEG MOVEMENT DATA The total Periodic Limb Movements of Sleep (PLMS) were 0. The PLMS index was 0.00. A PLMS index of <15 is considered normal in adults.  IMPRESSIONS - He did well with CPAP 8 cm H2O.  Of note is that he did not have REM or supine sleep at this pressure setting. - He did not require supplemental oxygen during this study.  DIAGNOSIS - Obstructive Sleep Apnea (G47.33)  RECOMMENDATIONS - Trial of auto CPAP 7 to 18 cm H2O with heated humidity. - He was fitted with a Wide size [employer]kel Nasal Evora (Nasal) mask and heated humidification. - Avoid alcohol, sedatives and other CNS depressants that may worsen sleep apnea and disrupt normal sleep architecture. - Sleep hygiene should be reviewed to assess factors that may improve sleep quality. - Weight management and regular exercise should be initiated or continued.  [Electronically signed] 08/19/2021 01:43 PM  Chesley Mires MD, [place], American Board of Sleep Medicine NPI: [id number]  [place]: [phone]  [place]: [phone] [place]

## 2021-08-19 NOTE — Telephone Encounter (Signed)
ATC patient and vm was full

## 2021-08-19 NOTE — Patient Instructions (Signed)
F/u as before, call if you need me sooner  You are referred to Orthopedics for evaluation and management of your knee pain  Careful not to fall  Thanks for choosing [facility], we consider it a privelige to serve you.

## 2021-08-25 ENCOUNTER — Encounter: Payer: Self-pay | Admitting: Family Medicine

## 2021-08-25 ENCOUNTER — Ambulatory Visit (INDEPENDENT_AMBULATORY_CARE_PROVIDER_SITE_OTHER): Payer: Medicaid Other | Admitting: Family Medicine

## 2021-08-25 ENCOUNTER — Telehealth: Payer: Self-pay

## 2021-08-25 VITALS — BP 105/67 | HR 76 | Resp 18 | Ht 65.0 in | Wt 267.0 lb

## 2021-08-25 DIAGNOSIS — I5032 Chronic diastolic (congestive) heart failure: Secondary | ICD-10-CM | POA: Diagnosis not present

## 2021-08-25 DIAGNOSIS — Z125 Encounter for screening for malignant neoplasm of prostate: Secondary | ICD-10-CM

## 2021-08-25 DIAGNOSIS — E785 Hyperlipidemia, unspecified: Secondary | ICD-10-CM

## 2021-08-25 DIAGNOSIS — F321 Major depressive disorder, single episode, moderate: Secondary | ICD-10-CM

## 2021-08-25 DIAGNOSIS — R7302 Impaired glucose tolerance (oral): Secondary | ICD-10-CM

## 2021-08-25 DIAGNOSIS — G4733 Obstructive sleep apnea (adult) (pediatric): Secondary | ICD-10-CM

## 2021-08-25 DIAGNOSIS — E559 Vitamin D deficiency, unspecified: Secondary | ICD-10-CM

## 2021-08-25 DIAGNOSIS — I10 Essential (primary) hypertension: Secondary | ICD-10-CM

## 2021-08-25 DIAGNOSIS — E669 Obesity, unspecified: Secondary | ICD-10-CM

## 2021-08-25 DIAGNOSIS — F5104 Psychophysiologic insomnia: Secondary | ICD-10-CM

## 2021-08-25 MED ORDER — SEMAGLUTIDE(0.25 OR 0.5MG/DOS) 2 MG/3ML ~~LOC~~ SOPN: 0.5000 mg | PEN_INJECTOR | SUBCUTANEOUS | 3 refills | Status: AC

## 2021-08-25 NOTE — Patient Instructions (Addendum)
Annual exam as before, in Sept , call if you need me sooner  Please get fasting lipid, cmp and EGFr, TSH, HBA1C  and PSA 1 week before next appt   Need to take fluid pills every day as directed  Thanks for choosing [facility], we consider it a privelige to serve you.

## 2021-08-25 NOTE — Telephone Encounter (Signed)
Pt's mom called to request a refill of albuterol (VENTOLIN HFA) 108 (90 Base) MCG/ACT inhaler [[id number]]. Pt was seen in office today and forgot to ask for refill. Please advise.  Cb#: [phone]

## 2021-08-26 ENCOUNTER — Ambulatory Visit (INDEPENDENT_AMBULATORY_CARE_PROVIDER_SITE_OTHER): Payer: Medicaid Other

## 2021-08-26 ENCOUNTER — Ambulatory Visit (INDEPENDENT_AMBULATORY_CARE_PROVIDER_SITE_OTHER): Payer: Medicaid Other | Admitting: Orthopaedic Surgery

## 2021-08-26 ENCOUNTER — Other Ambulatory Visit: Payer: Self-pay

## 2021-08-26 DIAGNOSIS — M25562 Pain in left knee: Secondary | ICD-10-CM

## 2021-08-26 MED ORDER — ALBUTEROL SULFATE HFA 108 (90 BASE) MCG/ACT IN AERS: 1.0000 | INHALATION_SPRAY | Freq: Four times a day (QID) | RESPIRATORY_TRACT | 3 refills | Status: DC | PRN

## 2021-08-26 NOTE — Telephone Encounter (Signed)
Refill sent.
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
## 2021-08-30 ENCOUNTER — Ambulatory Visit: Payer: Medicaid Other | Admitting: Psychiatry

## 2021-09-06 ENCOUNTER — Encounter: Payer: Self-pay | Admitting: Family Medicine

## 2021-09-06 NOTE — Assessment & Plan Note (Signed)
Managed by psych and controlled 

## 2021-09-06 NOTE — Assessment & Plan Note (Signed)
  Patient re-educated about  the importance of commitment to a  minimum of 150 minutes of exercise per week as able.  The importance of healthy food choices with portion control discussed, as well as eating regularly and within a 12 hour window most days. The need to choose "clean , green" food 50 to 75% of the time is discussed, as well as to make water the primary drink and set a goal of 64 ounces water daily.       08/25/2021    2:22 PM 08/25/2021    1:54 PM 08/10/2021    3:49 PM  Weight /BMI  Weight 267 lb 0.6 oz 272 lb   Height  '5\' 5"'$  (1.651 m)   BMI 44.44 kg/m2 45.26 kg/m2      Information is confidential and restricted. Go to Review Flowsheets to unlock data.    Continue medication currently taking and reduce caloric intake

## 2021-09-06 NOTE — Assessment & Plan Note (Signed)
Controlled, no change in medication  

## 2021-09-06 NOTE — Progress Notes (Signed)
   Shawn Ramsey     MRN: [id number]      DOB: 24-Jun-1980   HPI [name] is here for follow up and re-evaluation of chronic medical conditions, medication management and review of any available recent lab and radiology data.  Preventive health is updated, specifically  Cancer screening and Immunization.   Questions or concerns regarding consultations or procedures which the PT has had in the interim are  addressed. The PT denies any adverse reactions to current medications since the last visit.  There are no new concerns.  There are no specific complaints   ROS Denies recent fever or chills. Denies sinus pressure, nasal congestion, ear pain or sore throat. Denies chest congestion, productive cough or wheezing. Denies chest pains, palpitations and leg swelling Denies abdominal pain, nausea, vomiting,diarrhea or constipation.   Denies dysuria, frequency, hesitancy or incontinence. Denies joint pain, swelling and limitation in mobility. Denies headaches, seizures, numbness, or tingling. Denies depression, anxiety or insomnia. Denies skin break down or rash.   PE  BP 105/67   Pulse 76   Resp 18   Ht '5\' 5"'$  (1.651 m)   Wt 267 lb 0.6 oz (121.1 kg)   SpO2 95%   BMI 44.44 kg/m   Patient alert and oriented and in no cardiopulmonary distress.  HEENT: No facial asymmetry, EOMI,     Neck supple .  Chest: Clear to auscultation bilaterally.  CVS: S1, S2 no murmurs, no S3.Regular rate.  ABD: Soft non tender.   GGE:ZMOQH  edema bilaterally  MS: Adequate though reduced ROM spine, , hips and knees.  Skin: Intact, no ulcerations or rash noted.  Psych: Good eye contact, normal affect. Memory intact not anxious or depressed appearing.  CNS: CN 2-12 intact, power,  normal throughout.no focal deficits noted.   Assessment & Plan  Chronic diastolic heart failure (HCC) Slight decompensation with bilateral trace edema , re educated re need to take diuretic daily as  prescribed  OSA (obstructive sleep apnea) Reports compliance with equipment  Depression, major, single episode, moderate ([place]) Managed by psych and controlled   Dyslipidemia Hyperlipidemia:Low fat diet discussed and encouraged.   Lipid Panel  Lab Results  Component Value Date   CHOL 147 04/22/2021   HDL 37 (L) 04/22/2021   LDLCALC 87 04/22/2021   TRIG 127 04/22/2021   CHOLHDL 4.0 04/22/2021     nomed change, needs to increase activity  Insomnia Controlled, no change in medication   Morbid obesity due to excess calories [facility])  Patient re-educated about  the importance of commitment to a  minimum of 150 minutes of exercise per week as able.  The importance of healthy food choices with portion control discussed, as well as eating regularly and within a 12 hour window most days. The need to choose "clean , green" food 50 to 75% of the time is discussed, as well as to make water the primary drink and set a goal of 64 ounces water daily.       08/25/2021    2:22 PM 08/25/2021    1:54 PM 08/10/2021    3:49 PM  Weight /BMI  Weight 267 lb 0.6 oz 272 lb   Height  '5\' 5"'$  (1.651 m)   BMI 44.44 kg/m2 45.26 kg/m2      Information is confidential and restricted. Go to Review Flowsheets to unlock data.    Continue medication currently taking and reduce caloric intake

## 2021-09-06 NOTE — Assessment & Plan Note (Signed)
Reports compliance with equipment

## 2021-09-06 NOTE — Assessment & Plan Note (Signed)
Hyperlipidemia:Low fat diet discussed and encouraged.   Lipid Panel  Lab Results  Component Value Date   CHOL 147 04/22/2021   HDL 37 (L) 04/22/2021   LDLCALC 87 04/22/2021   TRIG 127 04/22/2021   CHOLHDL 4.0 04/22/2021     nomed change, needs to increase activity

## 2021-09-06 NOTE — Assessment & Plan Note (Signed)
Slight decompensation with bilateral trace edema , re educated re need to take diuretic daily as prescribed

## 2021-09-07 ENCOUNTER — Other Ambulatory Visit: Payer: Self-pay | Admitting: Family Medicine

## 2021-09-09 ENCOUNTER — Other Ambulatory Visit: Payer: Self-pay | Admitting: *Deleted

## 2021-09-09 MED ORDER — POTASSIUM CHLORIDE CRYS ER 20 MEQ PO TBCR: 20.0000 meq | EXTENDED_RELEASE_TABLET | Freq: Two times a day (BID) | ORAL | 0 refills | Status: DC
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
## 2021-09-15 ENCOUNTER — Ambulatory Visit: Payer: Medicaid Other | Admitting: Family Medicine

## 2021-09-16 ENCOUNTER — Telehealth (INDEPENDENT_AMBULATORY_CARE_PROVIDER_SITE_OTHER): Payer: Medicaid Other | Admitting: Psychiatry

## 2021-09-16 ENCOUNTER — Encounter: Payer: Self-pay | Admitting: Urology

## 2021-09-16 ENCOUNTER — Ambulatory Visit (INDEPENDENT_AMBULATORY_CARE_PROVIDER_SITE_OTHER): Payer: Medicaid Other | Admitting: Urology

## 2021-09-16 ENCOUNTER — Encounter: Payer: Self-pay | Admitting: Psychiatry

## 2021-09-16 VITALS — BP 115/75 | HR 80

## 2021-09-16 DIAGNOSIS — N200 Calculus of kidney: Secondary | ICD-10-CM | POA: Diagnosis not present

## 2021-09-16 DIAGNOSIS — F79 Unspecified intellectual disabilities: Secondary | ICD-10-CM | POA: Diagnosis not present

## 2021-09-16 DIAGNOSIS — F3341 Major depressive disorder, recurrent, in partial remission: Secondary | ICD-10-CM

## 2021-09-16 DIAGNOSIS — F1010 Alcohol abuse, uncomplicated: Secondary | ICD-10-CM | POA: Diagnosis not present

## 2021-09-16 DIAGNOSIS — R1032 Left lower quadrant pain: Secondary | ICD-10-CM

## 2021-09-16 MED ORDER — SERTRALINE HCL 100 MG PO TABS: 200.0000 mg | ORAL_TABLET | Freq: Every day | ORAL | 2 refills | Status: DC

## 2021-09-16 MED ORDER — NALTREXONE HCL 50 MG PO TABS: 25.0000 mg | ORAL_TABLET | Freq: Every day | ORAL | 0 refills | Status: DC
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
## 2021-09-16 NOTE — Patient Instructions (Signed)
Continue sertraline 200 mg daily Continue naltrexone 25 mg at night  Next appointment: 9/14 at 3 PM

## 2021-09-21 ENCOUNTER — Telehealth: Payer: Self-pay

## 2021-09-21 NOTE — Telephone Encounter (Signed)
Patient mother Shawn Ramsey called said Dr [name] has not yet sent in prescription for prednisone from last visit in July.  Please return call to [phone].  Pharm[facility]: [name]

## 2021-09-22 NOTE — Telephone Encounter (Signed)
Sent a mychart message that there was no prednisone supposed to be sent at his last appt

## 2021-09-22 NOTE — Telephone Encounter (Signed)
There was no mention of any prednisone being sent in at either of the June or July visits

## 2021-09-29 ENCOUNTER — Ambulatory Visit (HOSPITAL_COMMUNITY)
Admission: RE | Admit: 2021-09-29 | Discharge: 2021-09-29 | Disposition: A | Discharge status: Home / Self Care | Payer: Medicaid Other | Source: Ambulatory Visit | Attending: Acute Care | Admitting: Acute Care

## 2021-09-29 ENCOUNTER — Emergency Department (HOSPITAL_COMMUNITY)
Admission: EM | Admit: 2021-09-29 | Discharge: 2021-09-29 | Disposition: A | Discharge status: Home / Self Care | Payer: Medicaid Other | Attending: Emergency Medicine | Admitting: Emergency Medicine

## 2021-09-29 DIAGNOSIS — Z930 Tracheostomy status: Secondary | ICD-10-CM

## 2021-09-29 DIAGNOSIS — Z430 Encounter for attention to tracheostomy: Secondary | ICD-10-CM | POA: Diagnosis present

## 2021-09-29 DIAGNOSIS — Z6841 Body Mass Index (BMI) 40.0 and over, adult: Secondary | ICD-10-CM | POA: Diagnosis not present

## 2021-09-29 DIAGNOSIS — Z9989 Dependence on other enabling machines and devices: Secondary | ICD-10-CM | POA: Insufficient documentation

## 2021-09-29 DIAGNOSIS — I5032 Chronic diastolic (congestive) heart failure: Secondary | ICD-10-CM | POA: Insufficient documentation

## 2021-09-29 DIAGNOSIS — G4733 Obstructive sleep apnea (adult) (pediatric): Secondary | ICD-10-CM | POA: Diagnosis not present

## 2021-09-29 NOTE — Progress Notes (Signed)
Reason for visit  Trach care and possible decannulation   HPI 41 year old male. Well Known to me. 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 Last seen May 2023. Since that time he has had his sleep study. Still has sleep apnea but did well w/ CPAP 8, was set up on home auto-titrate CPAP 7-18 cmH2O w/ capped trach at night. Per my d/w Dr Halford Chessman if he can stay compliant w/ this he could be considered for CPAP.  CPAP info:   Review of Systems  All other systems reviewed and are negative.   Exam General 41 year old male. Ambulated into trach clinic. In no acute distress HENT NCAT 6 prox XLT unremarkable. Good phonation w/ PMV. Tolerates red occlusive valve Pulm clear  Card rrr Abd soft  Ext warm no edema Neuro intact  Procedure   6 prox XLT removed. New trach placed over obturator w/out difficulty by RRT [place] under my direct supervision. Placement verified via ETCO2. Pt tolerated well   Impression/plan Trach dependence Morbid obesity  OSA HFpEF  Discussion  Plan ROV 12 weeks  Once he has his CPAP I have told [place] and his mom I would like to see him use it for 8 weeks. If he is compliant w/ the CPAP and tolerates it well over that time period then I think we can safely take the trach out. This is in line w/ My discussion w/ Dr Halford Chessman who follows him for sleep   Erick Colace ACNP-BC [place] Pager # [phone] OR # [phone] if no answer

## 2021-09-29 NOTE — ED Provider Notes (Signed)
[facility] EMERGENCY DEPARTMENT Provider Note   CSN: [id number] Arrival date & time: 09/29/21  1832     History {Add pertinent medical, surgical, social history, OB history to HPI:1} Chief Complaint  Patient presents with   Tracheostomy Tube Change    Shawn Ramsey is a 41 y.o. male.  HPI Patient had his trach device downsized from 8 to a 6 today.  He does not have any inner cannulas at home, for the 6 Shiley tube.  This was a routine change.    Home Medications Prior to Admission medications   Medication Sig Start Date End Date Taking? Authorizing Provider  albuterol (PROVENTIL) (2.5 MG/3ML) 0.083% nebulizer solution Take 3 mLs (2.5 mg total) by nebulization every 6 (six) hours as needed for wheezing or shortness of breath. 04/22/21   Chesley Mires, MD  albuterol (VENTOLIN HFA) 108 (90 Base) MCG/ACT inhaler Inhale 1-2 puffs into the lungs every 6 (six) hours as needed for wheezing or shortness of breath. 08/26/21   Fayrene Helper, MD  atorvastatin (LIPITOR) 40 MG tablet Take 1 tablet by mouth once daily 09/07/21   Fayrene Helper, MD  ibuprofen (ADVIL) 800 MG tablet Take 800 mg by mouth every 8 (eight) hours as needed.    [provider]  melatonin 5 MG TABS Take 1 tablet (5 mg total) by mouth at bedtime. 07/15/20   Fayrene Helper, MD  metFORMIN (GLUCOPHAGE-XR) 500 MG 24 hr tablet Take 1 tablet by mouth once daily with breakfast 08/18/21   Fayrene Helper, MD  naltrexone (DEPADE) 50 MG tablet Take 0.5 tablets (25 mg total) by mouth daily. 10/10/21 11/09/21  [name], MD  potassium chloride SA (KLOR-CON M) 20 MEQ tablet Take 1 tablet (20 mEq total) by mouth 2 (two) times daily. 09/09/21   Satira Sark, MD  PULMICORT 0.25 MG/2ML nebulizer solution Take 2 mLs (0.25 mg total) by nebulization in the morning and at bedtime. 04/22/21   Chesley Mires, MD  sertraline (ZOLOFT) 100 MG tablet Take 2 tablets (200 mg total) by mouth daily. 10/10/21 01/08/22  [name], MD  sildenafil (VIAGRA) 100 MG tablet Take 0.5-1 tablets (50-100 mg total) by mouth daily as needed for erectile dysfunction. 04/20/21   Fayrene Helper, MD  torsemide (DEMADEX) 20 MG tablet Take 1 tablet (20 mg total) by mouth 2 (two) times daily. 11/02/20 02/14/21  Satira Sark, MD  traZODone (DESYREL) 150 MG tablet Take 1 tablet (150 mg total) by mouth at bedtime. 07/07/21   Fayrene Helper, MD  temazepam (RESTORIL) 7.5 MG capsule Take 1 capsule (7.5 mg total) by mouth at bedtime as needed for sleep. Patient not taking: Reported on 07/15/2020 03/19/20 07/25/20  Fayrene Helper, MD      Allergies    Patient has no known allergies.    Review of Systems   Review of Systems  Physical Exam Updated Vital Signs BP (!) 138/91 (BP Location: Right Arm)   Pulse 86   Temp 98.4 F (36.9 C)   Resp 20   Ht '5\' 5"'$  (1.651 m)   Wt 121.1 kg   SpO2 94%   BMI 44.43 kg/m  Physical Exam Vitals and nursing note reviewed.  Constitutional:      Appearance: He is well-developed. He is not ill-appearing.  HENT:     Head: Normocephalic and atraumatic.     Right Ear: External ear normal.     Left Ear: External ear normal.  Eyes:  Conjunctiva/sclera: Conjunctivae normal.     Pupils: Pupils are equal, round, and reactive to light.  Neck:     Trachea: Phonation normal.     Comments: Tracheostomy device, appears normal.  Small amount of drainage in the inner cannula when he removes it Cardiovascular:     Rate and Rhythm: Normal rate.  Pulmonary:     Effort: Pulmonary effort is normal. No respiratory distress.     Breath sounds: No stridor.  Abdominal:     Palpations: Abdomen is soft.     Tenderness: There is no abdominal tenderness.  Musculoskeletal:        General: Normal range of motion.     Cervical back: Normal range of motion and neck supple.  Skin:    General: Skin is warm and dry.  Neurological:     Mental Status: He is alert and oriented to person, place, and time.      Cranial Nerves: No cranial nerve deficit.     Sensory: No sensory deficit.     Motor: No abnormal muscle tone.     Coordination: Coordination normal.  Psychiatric:        Behavior: Behavior normal.        Thought Content: Thought content normal.        Judgment: Judgment normal.     ED Results / Procedures / Treatments   Labs (all labs ordered are listed, but only abnormal results are displayed) Labs Reviewed - No data to display  EKG None  Radiology No results found.  Procedures Procedures  {Document cardiac monitor, telemetry assessment procedure when appropriate:1}  Medications Ordered in ED Medications - No data to display  ED Course/ Medical Decision Making/ A&P                           Medical Decision Making  ***  {Document critical care time when appropriate:1} {Document review of labs and clinical decision tools ie heart score, Chads2Vasc2 etc:1}  {Document your independent review of radiology images, and any outside records:1} {Document your discussion with family members, caretakers, and with consultants:1} {Document social determinants of health affecting pt's care:1} {Document your decision making why or why not admission, treatments were needed:1} Final Clinical Impression(s) / ED Diagnoses Final diagnoses:  None    Rx / DC Orders ED Discharge Orders     None

## 2021-09-29 NOTE — ED Triage Notes (Addendum)
Pt states that he had a trach placed today in [place]- it was too small of a size and he needs a larger one. Pt brought equipment with him.  Pt in NAD in triage.

## 2021-09-29 NOTE — Discharge Instructions (Signed)
Follow-up with your regular medical providers for ongoing management of your tracheostomy.

## 2021-09-29 NOTE — ED Notes (Signed)
Pt was given another trach kit and a couple of inner cannulas by respiratory. Is comfortable going home

## 2021-09-29 NOTE — Progress Notes (Signed)
Tracheostomy Procedure Note  QUANTEZ SCHNYDER [id number] 01/21/1981  Pre Procedure Tracheostomy Information  Trach Brand: Shiley Size:  8.0 XLTUP Style: Uncuffed Secured by: Velcro   Procedure: Trach change and Trach cleaning    Post Procedure Tracheostomy Information  Trach Brand: Shiley Size:  6.0 XLTUP Style: Uncuffed Secured by: Velcro   Post Procedure Evaluation:  ETCO2 positive color change from yellow to purple : Yes.   Vital signs:VSS Patients current condition: stable Complications: No apparent complications Trach site exam: clean, dry Wound care done: 4 x 4 gauze Patient did tolerate procedure well.   Education: none  Prescription needs: none    Additional needs: none

## 2021-09-30 ENCOUNTER — Emergency Department (HOSPITAL_COMMUNITY)
Admission: EM | Admit: 2021-09-30 | Discharge: 2021-09-30 | Disposition: A | Discharge status: Home / Self Care | Payer: Medicaid Other | Attending: Emergency Medicine | Admitting: Emergency Medicine

## 2021-09-30 DIAGNOSIS — Z7984 Long term (current) use of oral hypoglycemic drugs: Secondary | ICD-10-CM | POA: Insufficient documentation

## 2021-09-30 DIAGNOSIS — J9500 Unspecified tracheostomy complication: Secondary | ICD-10-CM | POA: Insufficient documentation

## 2021-09-30 DIAGNOSIS — E119 Type 2 diabetes mellitus without complications: Secondary | ICD-10-CM | POA: Insufficient documentation

## 2021-09-30 DIAGNOSIS — J449 Chronic obstructive pulmonary disease, unspecified: Secondary | ICD-10-CM | POA: Diagnosis not present

## 2021-09-30 DIAGNOSIS — Z430 Encounter for attention to tracheostomy: Secondary | ICD-10-CM

## 2021-09-30 NOTE — Discharge Instructions (Addendum)
You were seen in the emergency department for trach issues.  We have given you supplies for the new size of your trach. If you're still having difficulties by Monday, please call the office to get a follow up appointment.   Continue to monitor how you're doing and return to the ER for new or worsening symptoms.

## 2021-09-30 NOTE — Progress Notes (Signed)
Seen for recent downsize from 8 to Size 6 prox XLT shiley. Had some brief periods of dysepna associated with phlegm production but these resolved with cough and breathing treatment. He is in no distress and there is no stridor, talking comfortably with PMV.  I reassured him this is great step toward eventual decannulation. His body is just getting used to breathing more from the mouth rather than through tracheostomy.  Hopefully he can be decannulated in coming months provided he can tolerate CPAP.  Size 6 XLT inner cannulas and spare prox XLT 6-0 given to patient.  All questions answered.  Appreciate EDP help.  Erskine Emery MD PCCM

## 2021-09-30 NOTE — ED Provider Notes (Signed)
[facility] EMERGENCY DEPARTMENT Provider Note   CSN: [id number] Arrival date & time: 09/30/21  1227     History  No chief complaint on file.   Shawn Ramsey is a 41 y.o. male who presents emergency department complaining of issue with his tracheostomy.  Patient was seen yesterday for similar symptoms and had recently been downsized from an 8 to 6 Shiley.  Believe that he had the wrong tube put in, so he went to ER in [place] where they placed an additional tube.  Presents today with some continuing discomfort.  HPI     Home Medications Prior to Admission medications   Medication Sig Start Date End Date Taking? Authorizing Provider  albuterol (PROVENTIL) (2.5 MG/3ML) 0.083% nebulizer solution Take 3 mLs (2.5 mg total) by nebulization every 6 (six) hours as needed for wheezing or shortness of breath. 04/22/21   Chesley Mires, MD  albuterol (VENTOLIN HFA) 108 (90 Base) MCG/ACT inhaler Inhale 1-2 puffs into the lungs every 6 (six) hours as needed for wheezing or shortness of breath. 08/26/21   Fayrene Helper, MD  atorvastatin (LIPITOR) 40 MG tablet Take 1 tablet by mouth once daily 09/07/21   Fayrene Helper, MD  ibuprofen (ADVIL) 800 MG tablet Take 800 mg by mouth every 8 (eight) hours as needed.    [provider]  melatonin 5 MG TABS Take 1 tablet (5 mg total) by mouth at bedtime. 07/15/20   Fayrene Helper, MD  metFORMIN (GLUCOPHAGE-XR) 500 MG 24 hr tablet Take 1 tablet by mouth once daily with breakfast 08/18/21   Fayrene Helper, MD  naltrexone (DEPADE) 50 MG tablet Take 0.5 tablets (25 mg total) by mouth daily. 10/10/21 11/09/21  [name], MD  potassium chloride SA (KLOR-CON M) 20 MEQ tablet Take 1 tablet (20 mEq total) by mouth 2 (two) times daily. 09/09/21   Satira Sark, MD  PULMICORT 0.25 MG/2ML nebulizer solution Take 2 mLs (0.25 mg total) by nebulization in the morning and at bedtime. 04/22/21   Chesley Mires, MD  sertraline  (ZOLOFT) 100 MG tablet Take 2 tablets (200 mg total) by mouth daily. 10/10/21 01/08/22  [name], MD  sildenafil (VIAGRA) 100 MG tablet Take 0.5-1 tablets (50-100 mg total) by mouth daily as needed for erectile dysfunction. 04/20/21   Fayrene Helper, MD  torsemide (DEMADEX) 20 MG tablet Take 1 tablet (20 mg total) by mouth 2 (two) times daily. 11/02/20 02/14/21  Satira Sark, MD  traZODone (DESYREL) 150 MG tablet Take 1 tablet (150 mg total) by mouth at bedtime. 07/07/21   Fayrene Helper, MD  temazepam (RESTORIL) 7.5 MG capsule Take 1 capsule (7.5 mg total) by mouth at bedtime as needed for sleep. Patient not taking: Reported on 07/15/2020 03/19/20 07/25/20  Fayrene Helper, MD      Allergies    Patient has no known allergies.    Review of Systems   Review of Systems  HENT:         Tracheostomy discomfort  All other systems reviewed and are negative.   Physical Exam Updated Vital Signs BP (!) 150/99 (BP Location: Right Arm)   Pulse 97   Temp 98.7 F (37.1 C) (Oral)   Resp (!) 22   SpO2 97%  Physical Exam Vitals and nursing note reviewed.  Constitutional:      Appearance: Normal appearance.  HENT:     Head: Normocephalic and atraumatic.  Eyes:     Conjunctiva/sclera: Conjunctivae  normal.  Neck:     Comments: Tracheostomy in place Pulmonary:     Effort: Pulmonary effort is normal. No respiratory distress.  Skin:    General: Skin is warm and dry.  Neurological:     Mental Status: He is alert.  Psychiatric:        Mood and Affect: Mood normal.        Behavior: Behavior normal.     ED Results / Procedures / Treatments   Labs (all labs ordered are listed, but only abnormal results are displayed) Labs Reviewed - No data to display  EKG None  Radiology No results found.  Procedures Procedures    Medications Ordered in ED Medications - No data to display  ED Course/ Medical Decision Making/ A&P                           Medical Decision  Making  Patient is a 41 year old male with history of cognitive developmental delay, sleep apnea, diabetes, COPD, with a tracheostomy in place.  He presents with continued tracheostomy discomfort.  Dr Erskine Emery with critical care evaluated patient in triage and felt patient was stable for discharge.  He gave the patient additional tracheostomy supplies, and encouraged him to follow-up with his partners in clinic next week.  He is to return to the ER for worsening symptoms.  Discharged in stable condition and all questions answered.        Final Clinical Impression(s) / ED Diagnoses Final diagnoses:  None    Rx / DC Orders ED Discharge Orders     None      Portions of this report may have been transcribed using voice recognition software. Every effort was made to ensure accuracy; however, inadvertent computerized transcription errors may be present.    Estill Cotta 09/30/21 1335    Tretha Sciara, MD 09/30/21 1402

## 2021-09-30 NOTE — ED Notes (Signed)
Pt was seen by PCCM in the triage area, treated, and discharged prior to triage by the nurse or EDPA

## 2021-09-30 NOTE — Progress Notes (Signed)
Trach Clinic Note Patient  in 09/29/2021 for trach change in trach clinic.  Changed to 6.0 XLTUP.  Dr.Sood made aware of size change.  Will make sure patient has appropriate new size trach and inner cannulas.  Caregiver made aware of this change, and supplies will be given to patient.  Caregiver made aware that if the patient develops shortness of breath at any time to come to the emergency room at [facility] to be evaluated for trach upsizing.  Caregiver expressed understanding of this.

## 2021-10-01 ENCOUNTER — Encounter: Payer: Self-pay | Admitting: Family Medicine

## 2021-10-01 ENCOUNTER — Ambulatory Visit (INDEPENDENT_AMBULATORY_CARE_PROVIDER_SITE_OTHER): Payer: Medicaid Other | Admitting: Family Medicine

## 2021-10-01 ENCOUNTER — Ambulatory Visit: Payer: Medicaid Other | Admitting: Family Medicine

## 2021-10-01 VITALS — BP 120/80 | HR 84 | Resp 16 | Ht 65.0 in | Wt 265.0 lb

## 2021-10-01 DIAGNOSIS — R7302 Impaired glucose tolerance (oral): Secondary | ICD-10-CM

## 2021-10-01 DIAGNOSIS — F321 Major depressive disorder, single episode, moderate: Secondary | ICD-10-CM

## 2021-10-01 DIAGNOSIS — R1011 Right upper quadrant pain: Secondary | ICD-10-CM

## 2021-10-01 DIAGNOSIS — E669 Obesity, unspecified: Secondary | ICD-10-CM

## 2021-10-01 DIAGNOSIS — I10 Essential (primary) hypertension: Secondary | ICD-10-CM

## 2021-10-01 DIAGNOSIS — E559 Vitamin D deficiency, unspecified: Secondary | ICD-10-CM

## 2021-10-01 DIAGNOSIS — I5032 Chronic diastolic (congestive) heart failure: Secondary | ICD-10-CM

## 2021-10-01 DIAGNOSIS — E785 Hyperlipidemia, unspecified: Secondary | ICD-10-CM

## 2021-10-01 DIAGNOSIS — Z125 Encounter for screening for malignant neoplasm of prostate: Secondary | ICD-10-CM

## 2021-10-01 DIAGNOSIS — R110 Nausea: Secondary | ICD-10-CM

## 2021-10-01 DIAGNOSIS — E1169 Type 2 diabetes mellitus with other specified complication: Secondary | ICD-10-CM

## 2021-10-01 DIAGNOSIS — Z930 Tracheostomy status: Secondary | ICD-10-CM

## 2021-10-01 DIAGNOSIS — K824 Cholesterolosis of gallbladder: Secondary | ICD-10-CM

## 2021-10-01 NOTE — Patient Instructions (Addendum)
Annual exam in 3 months, flu vaccine at visit  Fasting lipid, HBA1C, TSH , PSA, vit D and microalb next week  I will refer you to surgeon in [place] again, since you are experiencing pain and bloating with spicy foods, the polyp in your gall bladder needs evaluation  Keep monitoring food intake so that you continue to lose weight  Thanks for choosing [facility], we consider it a privelige to serve you.

## 2021-10-04 ENCOUNTER — Encounter: Payer: Self-pay | Admitting: Family Medicine

## 2021-10-04 DIAGNOSIS — E1169 Type 2 diabetes mellitus with other specified complication: Secondary | ICD-10-CM | POA: Insufficient documentation

## 2021-10-04 NOTE — Assessment & Plan Note (Signed)
Trackh x 9 monts, workig on weight loss to remove same, tube size recently decreased and he continues to lose weight

## 2021-10-04 NOTE — Assessment & Plan Note (Signed)
Reports increased nausea and bloating with spicy foods, known gall baldder polyp which had increased in size in 2022, latest report in 06/2021, poly is samaller, refer to gen surgery again, pt NS previous appt

## 2021-10-04 NOTE — Assessment & Plan Note (Signed)
Managed by Psych and controlled

## 2021-10-04 NOTE — Assessment & Plan Note (Signed)
Stable , no s/s of decompensation 

## 2021-10-04 NOTE — Progress Notes (Signed)
Shawn Ramsey     MRN: [id number]      DOB: 1981/02/14   HPI Shawn Ramsey is here for follow up and re-evaluation of chronic medical conditions, medication management and review of any available recent lab and radiology data.  Preventive health is updated, specifically  Cancer screening and Immunization.   Recently has had his trach tube downsized, been to the ED several times.. Still working at weight loss so it can be removed, and continues to do v ery well The PT denies any adverse reactions to current medications since the last visit.    ROS Denies recent fever or chills. Denies sinus pressure, nasal congestion, ear pain or sore throat. Denies chest congestion, productive cough or wheezing. Denies chest pains, palpitations and leg swelling  C/o abdominal pain and blioating with spicy foods Denies dysuria, frequency, hesitancy or incontinence. Denies uncontrolled joint pain, swelling and limitation in mobility. Denies headaches, seizures, numbness, or tingling. Denies depression, anxiety or insomnia. Denies skin break down or rash.   PE  BP 120/80   Pulse 84   Resp 16   Ht '5\' 5"'$  (1.651 m)   Wt 265 lb (120.2 kg)   SpO2 94%   BMI 44.10 kg/m   Patient alert and oriented and in no cardiopulmonary distress.  HEENT: No facial asymmetry, EOMI,     Neck supple .  Chest: Clear to auscultation bilaterally.  CVS: S1, S2 no murmurs, no S3.Regular rate.  ABD: Soft superficial epigastric and LUQ tenderness  Ext: No edema  MS: Adequate  though reduced ROM spine, , hips and knees.  Skin: Intact, no ulcerations or rash noted.  Psych: Good eye contact, normal affect.  not anxious or depressed appearing.  CNS: CN 2-12 intact, power,  normal throughout.no focal deficits noted.   Assessment & Plan  Gallbladder polyp Reports increased nausea and bloating with spicy foods, known gall baldder polyp which had increased in size in 2022, latest report in 06/2021, poly is samaller,  refer to gen surgery again, pt NS previous appt  Morbid obesity due to excess calories ([name]) Improved, heis applauded onm this  Patient re-educated about  the importance of commitment to a  minimum of 150 minutes of exercise per week as able.  The importance of healthy food choices with portion control discussed, as well as eating regularly and within a 12 hour window most days. The need to choose "clean , green" food 50 to 75% of the time is discussed, as well as to make water the primary drink and set a goal of 64 ounces water daily.       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   The need to  take medication as prescribed, test blood sugar as directed, and to call between visits if there is a concern that blood sugar is uncontrolled is also discussed.   Shawn Ramsey is reminded of the importance of daily foot exam, annual eye examination, and good blood sugar, blood pressure and cholesterol control.     Latest Ref Rng & Units 04/22/2021   11:24 AM 02/14/2021   10:59 PM 02/14/2021   10:53 PM 12/15/2020   11:58 AM 07/28/2020   12:17 PM  Diabetic Labs  HbA1c 4.8 - 5.6 % 5.7    5.7    Chol 100 - 199 mg/dL 147    134    HDL >39 mg/dL 37    31    Calc LDL 0 - 99 mg/dL 87    84    Triglycerides 0 - 149 mg/dL 127     99    Creatinine 0.76 - 1.27 mg/dL 0.73  0.92  0.80  0.93  0.76       10/01/2021    3:39 PM 09/30/2021   12:46 PM 09/29/2021    6:39 PM 09/16/2021   11:03 AM 08/25/2021    2:22 PM 08/25/2021    1:54 PM 08/10/2021    3:49 PM  BP/Weight  Systolic BP 333 545 625 638  937   Diastolic BP 80 99 91 75  67   Wt. (Lbs) 265  267  267.04 272   BMI 44.1 kg/m2  44.43 kg/m2  44.44 kg/m2 45.26 kg/m2      Information is confidential and restricted. Go to Review Flowsheets to unlock data.      04/20/2021   10:20 AM 01/09/2018    4:00 PM  Foot/eye exam completion dates  Foot Form Completion Done Done      Updated lab needed at/ before next visit.   Chronic diastolic heart failure (HCC) Stable, no s/s of decompensation

## 2021-10-04 NOTE — Assessment & Plan Note (Signed)
Improved, heis applauded onm this  Patient re-educated about  the importance of commitment to a  minimum of 150 minutes of exercise per week as able.  The importance of healthy food choices with portion control discussed, as well as eating regularly and within a 12 hour window most days. The need to choose "clean , green" food 50 to 75% of the time is discussed, as well as to make water the primary drink and set a goal of 64 ounces water daily.       10/01/2021    3:39 PM 09/29/2021    6:39 PM 08/25/2021    2:22 PM  Weight /BMI  Weight 265 lb 267 lb 267 lb 0.6 oz  Height '5\' 5"'$  (1.651 m) '5\' 5"'$  (1.651 m)   BMI 44.1 kg/m2 44.43 kg/m2 44.44 kg/m2

## 2021-10-04 NOTE — Assessment & Plan Note (Signed)
Controlled, no change in medication Shawn Ramsey is reminded of the importance of commitment to daily physical activity for 30 minutes or more, as able and the need to limit carbohydrate intake to 30 to 60 grams per meal to help with blood sugar control.   The need to take medication as prescribed, test blood sugar as directed, and to call between visits if there is a concern that blood sugar is uncontrolled is also discussed.   Shawn Ramsey is reminded of the importance of daily foot exam, annual eye examination, and good blood sugar, blood pressure and cholesterol control.     Latest Ref Rng & Units 04/22/2021   11:24 AM 02/14/2021   10:59 PM 02/14/2021   10:53 PM 12/15/2020   11:58 AM 07/28/2020   12:17 PM  Diabetic Labs  HbA1c 4.8 - 5.6 % 5.7    5.7    Chol 100 - 199 mg/dL 147    134    HDL >39 mg/dL 37    31    Calc LDL 0 - 99 mg/dL 87    84    Triglycerides 0 - 149 mg/dL 127    99    Creatinine 0.76 - 1.27 mg/dL 0.73  0.92  0.80  0.93  0.76       10/01/2021    3:39 PM 09/30/2021   12:46 PM 09/29/2021    6:39 PM 09/16/2021   11:03 AM 08/25/2021    2:22 PM 08/25/2021    1:54 PM 08/10/2021    3:49 PM  BP/Weight  Systolic BP 491 791 505 697  948   Diastolic BP 80 99 91 75  67   Wt. (Lbs) 265  267  267.04 272   BMI 44.1 kg/m2  44.43 kg/m2  44.44 kg/m2 45.26 kg/m2      Information is confidential and restricted. Go to Review Flowsheets to unlock data.      04/20/2021   10:20 AM 01/09/2018    4:00 PM  Foot/eye exam completion dates  Foot Form Completion Done Done      Updated lab needed at/ before next visit.

## 2021-10-04 NOTE — Assessment & Plan Note (Signed)
Hyperlipidemia:Low fat diet discussed and encouraged.   Lipid Panel  Lab Results  Component Value Date   CHOL 147 04/22/2021   HDL 37 (L) 04/22/2021   LDLCALC 87 04/22/2021   TRIG 127 04/22/2021   CHOLHDL 4.0 04/22/2021     Updated lab needed at/ before next visit.

## 2021-10-05 ENCOUNTER — Other Ambulatory Visit: Payer: Self-pay | Admitting: Family Medicine

## 2021-10-05 MED ORDER — ERGOCALCIFEROL 1.25 MG (50000 UT) PO CAPS
50000.0000 [IU] | ORAL_CAPSULE | ORAL | 2 refills | Status: DC
Start: 2021-10-05 — End: 2022-02-16

## 2021-10-05 NOTE — Progress Notes (Signed)
Start weekly vit d

## 2021-10-06 LAB — LIPID PANEL
Chol/HDL Ratio: 3.7 ratio (ref 0.0–5.0)
Cholesterol, Total: 131 mg/dL (ref 100–199)
HDL: 35 mg/dL — ABNORMAL LOW (ref >39)
LDL Chol Calc (NIH): 79 mg/dL (ref 0–99)
Triglycerides: 91 mg/dL (ref 0–149)
VLDL Cholesterol Cal: 17 mg/dL (ref 5–40)

## 2021-10-06 LAB — TSH: TSH: 1.15 u[IU]/mL (ref 0.450–4.500)

## 2021-10-06 LAB — MICROALBUMIN / CREATININE URINE RATIO
Creatinine, Urine: 125.9 mg/dL
Microalb/Creat Ratio: 3 mg/g creat (ref 0–29)
Microalbumin, Urine: 4 ug/mL

## 2021-10-06 LAB — VITAMIN D 25 HYDROXY (VIT D DEFICIENCY, FRACTURES): Vit D, 25-Hydroxy: 22.6 ng/mL — ABNORMAL LOW (ref 30.0–100.0)

## 2021-10-06 LAB — HEMOGLOBIN A1C
Est. average glucose Bld gHb Est-mCnc: 111 mg/dL
Hgb A1c MFr Bld: 5.5 % (ref 4.8–5.6)

## 2021-10-06 LAB — PSA: Prostate Specific Ag, Serum: 0.7 ng/mL (ref 0.0–4.0)

## 2021-10-19 ENCOUNTER — Telehealth: Payer: Self-pay | Admitting: Pulmonary Disease

## 2021-10-19 DIAGNOSIS — G4733 Obstructive sleep apnea (adult) (pediatric): Secondary | ICD-10-CM

## 2021-10-19 NOTE — Telephone Encounter (Signed)
Please send order to have his DME refit his CPAP mask.

## 2021-10-19 NOTE — Telephone Encounter (Signed)
Called and spoke to patients  mother Shawn Ramsey   She states the cpap hose has a  hard plastic piece on forehead and bothering his nose. She states patient wants a full face mask but the mask he has is uncomfortable. Shawn Ramsey would like Dr.Soods advise on what to do. Please advise

## 2021-10-19 NOTE — Telephone Encounter (Signed)
Called and notified patients mother [name]. She is aware we placed an order. Nothing further needed at this time.
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
## 2021-11-04 ENCOUNTER — Encounter: Payer: Self-pay | Admitting: Psychiatry

## 2021-11-04 ENCOUNTER — Ambulatory Visit (INDEPENDENT_AMBULATORY_CARE_PROVIDER_SITE_OTHER): Payer: Medicaid Other | Admitting: Psychiatry

## 2021-11-04 ENCOUNTER — Other Ambulatory Visit
Admission: RE | Admit: 2021-11-04 | Discharge: 2021-11-04 | Disposition: A | Discharge status: Home / Self Care | Payer: Medicaid Other | Source: Ambulatory Visit | Attending: Psychiatry | Admitting: Psychiatry

## 2021-11-04 VITALS — BP 123/66 | HR 85 | Ht 65.0 in | Wt 268.0 lb

## 2021-11-04 DIAGNOSIS — F1010 Alcohol abuse, uncomplicated: Secondary | ICD-10-CM | POA: Insufficient documentation

## 2021-11-04 DIAGNOSIS — F79 Unspecified intellectual disabilities: Secondary | ICD-10-CM

## 2021-11-04 DIAGNOSIS — F3341 Major depressive disorder, recurrent, in partial remission: Secondary | ICD-10-CM

## 2021-11-04 LAB — HEPATIC FUNCTION PANEL
ALT: 14 U/L (ref 0–44)
AST: 15 U/L (ref 15–41)
Albumin: 4.3 g/dL (ref 3.5–5.0)
Alkaline Phosphatase: 56 U/L (ref 38–126)
Bilirubin, Direct: <0.1 mg/dL (ref 0.0–0.2)
Total Bilirubin: 0.5 mg/dL (ref 0.3–1.2)
Total Protein: 7.9 g/dL (ref 6.5–8.1)

## 2021-11-04 MED ORDER — NALTREXONE HCL 50 MG PO TABS: 25.0000 mg | ORAL_TABLET | Freq: Every day | ORAL | 1 refills | Status: DC

## 2021-11-17 ENCOUNTER — Encounter: Payer: Medicaid Other | Admitting: Family Medicine

## 2021-11-19 ENCOUNTER — Encounter: Payer: Self-pay | Admitting: Family Medicine

## 2021-11-19 ENCOUNTER — Ambulatory Visit (INDEPENDENT_AMBULATORY_CARE_PROVIDER_SITE_OTHER): Payer: Medicaid Other | Admitting: Family Medicine

## 2021-11-19 DIAGNOSIS — L03221 Cellulitis of neck: Secondary | ICD-10-CM

## 2021-11-19 DIAGNOSIS — J45991 Cough variant asthma: Secondary | ICD-10-CM | POA: Diagnosis not present

## 2021-11-19 DIAGNOSIS — J309 Allergic rhinitis, unspecified: Secondary | ICD-10-CM | POA: Diagnosis not present

## 2021-11-19 MED ORDER — SILDENAFIL CITRATE 100 MG PO TABS: 50.0000 mg | ORAL_TABLET | Freq: Every day | ORAL | 11 refills | Status: DC | PRN

## 2021-11-19 MED ORDER — POTASSIUM CHLORIDE CRYS ER 20 MEQ PO TBCR: 20.0000 meq | EXTENDED_RELEASE_TABLET | Freq: Two times a day (BID) | ORAL | 5 refills | Status: DC

## 2021-11-19 MED ORDER — AZELASTINE HCL 0.1 % NA SOLN: 2.0000 | Freq: Two times a day (BID) | NASAL | 2 refills | Status: DC

## 2021-11-19 MED ORDER — CEPHALEXIN 500 MG PO CAPS: 500.0000 mg | ORAL_CAPSULE | Freq: Two times a day (BID) | ORAL | 0 refills | Status: DC

## 2021-11-19 MED ORDER — PREDNISONE 5 MG PO TABS: 5.0000 mg | ORAL_TABLET | Freq: Two times a day (BID) | ORAL | 0 refills | Status: AC

## 2021-11-19 NOTE — Progress Notes (Unsigned)
Virtual Visit via Telephone Note  I connected with [name] on 11/19/21 at  4:40 PM EDT by telephone and verified that I am speaking with the correct person using two identifiers.  Location: Patient: home  Provider: office    I discussed the limitations, risks, security and privacy concerns of performing an evaluation and management service by telephone and the availability of in person appointments. I also discussed with the patient that there may be a patient responsible charge related to this service. The patient expressed understanding and agreed to proceed.   History of Present Illness:   2 day h/o excess nasal congestion and sinus drainage, denies fever, cills or productive cough. Drainage is clear and watery, he is having excess sneeing also C/o increased dry cough and wheeze x 3 days C/o redness at trach site for past 4 days, no purulent drainage noted Observations/Objective: There were no vitals taken for this visit. Good communication with no confusion and intact memory. Alert and oriented x 3 Cough and nasal congestion heard during visit Assessment and Plan:  Allergic rhinitis Increased and uncontrolled symptoms , Astelin prescribed  COUGH VARIANT ASTHMA Currently reporting increased symptoms suggesting uncontrolled , short course of prednisone prescribed  Cellulitis Mild/ early case reported at trach site, keflex is prescribed , short term, advised in office or ED visit if worsens  Follow Up Instructions:    I discussed the assessment and treatment plan with the patient. The patient was provided an opportunity to ask questions and all were answered. The patient agreed with the plan and demonstrated an understanding of the instructions.   The patient was advised to call back or seek an in-person evaluation if the symptoms worsen or if the condition fails to improve as anticipated.  I provided 12 minutes of non-face-to-face time during this  encounter.   Tula Nakayama, MD

## 2021-11-19 NOTE — Patient Instructions (Signed)
F/U as before, call if you need me sooner  Medication is prescribed for nasal congestion from allergies, astellin  Medication is prescribed for shortness of breath/ wheezing , short course of prednisone  Medication is prescribed for mild infection// redness around trach site , keflex  Contact lung specialist for breathing medications  Thanks for choosing [facility], we consider it a privelige to serve you.

## 2021-11-21 ENCOUNTER — Encounter: Payer: Self-pay | Admitting: Family Medicine

## 2021-11-21 DIAGNOSIS — L0390 Cellulitis, unspecified: Secondary | ICD-10-CM | POA: Insufficient documentation

## 2021-11-21 NOTE — Assessment & Plan Note (Signed)
Mild/ early case reported at trach site, keflex is prescribed , short term, advised in office or ED visit if worsens

## 2021-11-21 NOTE — Assessment & Plan Note (Signed)
Currently reporting increased symptoms suggesting uncontrolled , short course of prednisone prescribed

## 2021-11-21 NOTE — Assessment & Plan Note (Signed)
Increased and uncontrolled symptoms , Astelin prescribed 

## 2021-11-25 ENCOUNTER — Ambulatory Visit (HOSPITAL_COMMUNITY)
Admission: RE | Admit: 2021-11-25 | Discharge: 2021-11-25 | Disposition: A | Discharge status: Home / Self Care | Payer: Medicaid Other | Source: Ambulatory Visit | Attending: Acute Care | Admitting: Acute Care

## 2021-11-25 DIAGNOSIS — Z930 Tracheostomy status: Secondary | ICD-10-CM

## 2021-11-25 DIAGNOSIS — G4733 Obstructive sleep apnea (adult) (pediatric): Secondary | ICD-10-CM | POA: Diagnosis present

## 2021-11-25 DIAGNOSIS — E662 Morbid (severe) obesity with alveolar hypoventilation: Secondary | ICD-10-CM | POA: Diagnosis present

## 2021-11-25 DIAGNOSIS — I5032 Chronic diastolic (congestive) heart failure: Secondary | ICD-10-CM | POA: Diagnosis present

## 2021-11-25 NOTE — Progress Notes (Signed)
Tracheostomy Procedure Note  Shawn Ramsey [id number] 1981/02/05  Pre Procedure Tracheostomy Information  Trach Brand: Shiley Size:  60 6XLTUP Style: Uncuffed Secured by: Velcro   Procedure: Trach change and Trach cleaning    Post Procedure Tracheostomy Information  Trach Brand: Shiley Size:  6.0   6XLTUP Style: Uncuffed Secured by: Velcro   Post Procedure Evaluation:  ETCO2 positive color change from yellow to purple : Yes.   Vital signs:VSS Patients current condition: stable Complications: No apparent complications Trach site exam: clean, dry Wound care done: drain gauze applied Patient did tolerate procedure well.   Education: CPAP mask  education  Prescription needs: none    Additional needs: New PMV given at this visit

## 2021-11-25 NOTE — Progress Notes (Signed)
Reason for visit  Trach management/ possible decannulation   HPI Peace is well known to me. I follow him for trach management in the setting of MO w/ associated OHS and OSA. Happy to report he has had significant weight loss and recently had successful PSG and now has home CPAP. On our last visit our plan was to assess his tolerance and compliance w/ CPAP and if he is ready for decannulation . Presents today in no distress. He has been having trouble with mask fit since August. It looks like DME company was contacted but does not seem like there has been any attempt to fit the mask he has had about a week now using the CPAP and still has not gotten to a point where he feels it is comfortable or that we can say that he is using it routinely   Review of Systems  Constitutional: Negative.   HENT: Negative.         Does report difficulty w/ CPAP mask fit w/ sig discomfort at bridge of nose   Eyes: Negative.   Respiratory: Negative.    Cardiovascular: Negative.   Gastrointestinal: Negative.   Genitourinary: Negative.   Musculoskeletal: Negative.   Skin: Negative.    Exam  General 41 yom ambulatory and in no distress HENT NCAT neck large. Trach is midline phonation at baseline w/ capping and PMV. No upper airway wheezing Pulm dec bases Card rrr Abd soft  Ext warm and dry  Neuro intact  Procedure The 6 prox xlt was removed. Site inspected he does have some granulation tissue surrounding the stoma site other wise it is unremarkable. A new 6 prox xlt shiley was placed over obturator. Pt tolerated well. Placement confirmed w/ ETCO2  Impression/plan Principal Problem:   Tracheostomy status (HCC) Active Problems:   OSA (obstructive sleep apnea)   Obesity hypoventilation syndrome (HCC)   Chronic diastolic heart failure (HCC)    Tracheostomy status ([place]) Overview: Followed at [facility].  Size 6 cuffless proximal XLT shiley Last change 10/5.  Discussion Eryc is doing  well. He is in no distress and it seems like he could tolerate CPAP if we could get proper mask fit. We were able to measure him and fit him with a medium full face mask today in our visit.   Plan ROV 8 weeks  If tolerating CPAP will decannulate   Shawn Ramsey ACNP-BC [place] Pager # [phone] OR # [phone] if no answer

## 2021-11-26 ENCOUNTER — Ambulatory Visit (HOSPITAL_COMMUNITY): Payer: Medicaid Other

## 2021-11-29 ENCOUNTER — Other Ambulatory Visit: Payer: Self-pay

## 2021-11-29 ENCOUNTER — Telehealth: Payer: Self-pay | Admitting: Family Medicine

## 2021-11-29 MED ORDER — OZEMPIC (0.25 OR 0.5 MG/DOSE) 2 MG/3ML ~~LOC~~ SOPN: PEN_INJECTOR | SUBCUTANEOUS | 0 refills | Status: DC

## 2021-11-29 NOTE — Telephone Encounter (Signed)
Mother called in on patient behalf  Want know when patient will increase to next dose of OZEMPIC   Also needs refill if not time for increase   OZEMPIC, 0.25 OR 0.5 MG/DOSE, 2 MG/3ML SOPN    wants a call back

## 2021-11-29 NOTE — Telephone Encounter (Signed)
Patient aware refilled

## 2022-01-03 ENCOUNTER — Other Ambulatory Visit: Payer: Self-pay | Admitting: Psychiatry

## 2022-01-06 ENCOUNTER — Encounter: Payer: Medicaid Other | Admitting: Family Medicine

## 2022-01-06 ENCOUNTER — Encounter: Payer: Self-pay | Admitting: Pulmonary Disease

## 2022-01-06 ENCOUNTER — Ambulatory Visit: Payer: Medicaid Other | Admitting: Psychiatry

## 2022-01-06 ENCOUNTER — Ambulatory Visit (INDEPENDENT_AMBULATORY_CARE_PROVIDER_SITE_OTHER): Payer: Medicaid Other | Admitting: Pulmonary Disease

## 2022-01-06 VITALS — BP 128/80 | HR 78 | Temp 98.4°F | Ht 65.0 in | Wt 275.8 lb

## 2022-01-06 DIAGNOSIS — Z23 Encounter for immunization: Secondary | ICD-10-CM

## 2022-01-06 DIAGNOSIS — J4540 Moderate persistent asthma, uncomplicated: Secondary | ICD-10-CM

## 2022-01-06 DIAGNOSIS — Z930 Tracheostomy status: Secondary | ICD-10-CM

## 2022-01-06 DIAGNOSIS — G4700 Insomnia, unspecified: Secondary | ICD-10-CM | POA: Diagnosis not present

## 2022-01-06 DIAGNOSIS — G4733 Obstructive sleep apnea (adult) (pediatric): Secondary | ICD-10-CM | POA: Diagnosis not present

## 2022-01-06 DIAGNOSIS — G4730 Sleep apnea, unspecified: Secondary | ICD-10-CM

## 2022-01-06 MED ORDER — ZOLPIDEM TARTRATE 5 MG PO TABS: 5.0000 mg | ORAL_TABLET | Freq: Every evening | ORAL | 2 refills | Status: DC | PRN
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
## 2022-01-06 NOTE — Patient Instructions (Signed)
Zolpidem (ambien) 5 mg sleep pill nightly  Use your CPAP whenever you are sleeping  Follow up in 2 months

## 2022-01-10 ENCOUNTER — Encounter: Payer: Self-pay | Admitting: Psychiatry

## 2022-01-10 ENCOUNTER — Telehealth (INDEPENDENT_AMBULATORY_CARE_PROVIDER_SITE_OTHER): Payer: Medicaid Other | Admitting: Psychiatry

## 2022-01-10 DIAGNOSIS — F1011 Alcohol abuse, in remission: Secondary | ICD-10-CM | POA: Diagnosis not present

## 2022-01-10 DIAGNOSIS — F79 Unspecified intellectual disabilities: Secondary | ICD-10-CM

## 2022-01-10 DIAGNOSIS — F3341 Major depressive disorder, recurrent, in partial remission: Secondary | ICD-10-CM | POA: Diagnosis not present

## 2022-01-10 NOTE — Patient Instructions (Signed)
Continue sertraline 200 mg at night Continue naltrexone 25 mg at night  Next appointment: 1/22 at 2:30, in person
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
## 2022-01-20 ENCOUNTER — Telehealth: Payer: Self-pay | Admitting: Family Medicine

## 2022-01-20 ENCOUNTER — Ambulatory Visit (HOSPITAL_COMMUNITY)
Admission: RE | Admit: 2022-01-20 | Discharge: 2022-01-20 | Disposition: A | Discharge status: Home / Self Care | Payer: Medicaid Other | Source: Ambulatory Visit | Attending: Acute Care | Admitting: Acute Care

## 2022-01-20 DIAGNOSIS — Z930 Tracheostomy status: Secondary | ICD-10-CM | POA: Diagnosis not present

## 2022-01-20 DIAGNOSIS — G4733 Obstructive sleep apnea (adult) (pediatric): Secondary | ICD-10-CM | POA: Diagnosis present

## 2022-01-20 NOTE — Telephone Encounter (Signed)
Please advise 

## 2022-01-20 NOTE — Telephone Encounter (Signed)
Patient called asking can labs be ordered to have done this afternoon to go over at his appointment next week. Please contact patient and let patient know.

## 2022-01-20 NOTE — Telephone Encounter (Signed)
Pt aware.

## 2022-01-20 NOTE — Progress Notes (Signed)
Reason for visit  Trach management/ possible decannulation   HPI Randel is well known to me. I follow him for trach management in the setting of MO w/ associated OHS and OSA. Happy to report he has had significant weight loss and recently had successful PSG and now has home CPAP. We have been working on assessing for decannulation. Last time the barrier seemed to be mask fit. I see from visit not w/ Dr Halford Chessman 11/16 he is still not very compliant w/ his CPAP stating various issues from comfort to falling asleep before he can place CPAP. Presents today for routine trach care.   Review of Systems  Constitutional: Negative.   HENT: Negative.    Eyes: Negative.   Respiratory: Negative.    Cardiovascular: Negative.   Gastrointestinal: Negative.   Genitourinary: Negative.   Musculoskeletal: Negative.   Skin: Negative.   Endo/Heme/Allergies: Negative.     Exam   General 41 year old male. He is ambulatory and in no distress HENT NCAT no JVD. MMM does have sig granulation tissue that surrounds the trach stoma Pulm clear  Card rrr Abd soft Ext warm and dry  Neuro intact  Procedure The 6 trach was removed. Site inspected and as recorded above. New 6 prox XLT was placed w/out difficulty over obturator. PT tolerated well. Placement confirmed via ETCO2  Impression/plan Principal Problem:   Tracheostomy status (HCC) Active Problems:   OSA (obstructive sleep apnea)   Morbid obesity due to excess calories ([place])    Tracheostomy status ([place]) Overview: Followed at [facility].  Size 6 cuffless proximal XLT shiley Last change 11/30  Discussion Per last Note from Dr Halford Chessman. Still not doing great w/ compliance re: CPAP  His trach is stable and he is clear for decannulation IMO once he can prove he will use his CPAP regularly   Plan ROV 8 weeks to assess for decannulation   Erick Colace ACNP-BC [place] Pager # [phone] OR # [phone] if no  answer    OSA (obstructive sleep apnea) Overview: CPAP download shows his average AHI 0.7 with median CPAP 8 and 95 th percentile CPAP 10 cm H2O.  Plan Cont CPAP at HS (last seen by Dr Halford Chessman 11/16)       My time 37 min   Erick Colace ACNP-BC [place] Pager # [phone] OR # [phone] if no answer

## 2022-01-20 NOTE — Progress Notes (Signed)
Tracheostomy Procedure Note  Shawn Ramsey [id number] 1980/06/29  Pre Procedure Tracheostomy Information  Trach Brand: Shiley Size:  6.0 XLTUP Style: Uncuffed Secured by: Velcro   Procedure: Trach Cleaning and  Trach Change     Post Procedure Tracheostomy Information  Trach Brand: Shiley Size:  6.0XLTUP Style: Uncuffed Secured by: Velcro   Post Procedure Evaluation:  ETCO2 positive color change from yellow to purple : Yes.   Vital signs:VSS Patients current condition: stable Complications: No apparent complications Trach site exam: clean, dry Wound care done: dry drain gauze Patient did tolerate procedure well.   Education: none  Prescription needs: none    Additional needs: Patient given new PMV at this visit

## 2022-01-24 ENCOUNTER — Telehealth: Payer: Self-pay

## 2022-01-24 ENCOUNTER — Encounter: Payer: Medicaid Other | Admitting: Family Medicine

## 2022-01-24 NOTE — Telephone Encounter (Signed)
Opened in error

## 2022-01-26 ENCOUNTER — Telehealth: Payer: Self-pay | Admitting: Family Medicine

## 2022-01-26 ENCOUNTER — Ambulatory Visit (INDEPENDENT_AMBULATORY_CARE_PROVIDER_SITE_OTHER): Payer: Medicaid Other | Admitting: Family Medicine

## 2022-01-26 ENCOUNTER — Encounter: Payer: Self-pay | Admitting: Family Medicine

## 2022-01-26 VITALS — BP 137/75 | HR 86 | Ht 65.0 in | Wt 270.0 lb

## 2022-01-26 DIAGNOSIS — I5032 Chronic diastolic (congestive) heart failure: Secondary | ICD-10-CM | POA: Diagnosis not present

## 2022-01-26 DIAGNOSIS — E1169 Type 2 diabetes mellitus with other specified complication: Secondary | ICD-10-CM | POA: Diagnosis not present

## 2022-01-26 DIAGNOSIS — J45991 Cough variant asthma: Secondary | ICD-10-CM

## 2022-01-26 DIAGNOSIS — I10 Essential (primary) hypertension: Secondary | ICD-10-CM

## 2022-01-26 DIAGNOSIS — E669 Obesity, unspecified: Secondary | ICD-10-CM

## 2022-01-26 DIAGNOSIS — E785 Hyperlipidemia, unspecified: Secondary | ICD-10-CM

## 2022-01-26 MED ORDER — TIRZEPATIDE 5 MG/0.5ML ~~LOC~~ SOAJ: 5.0000 mg | SUBCUTANEOUS | 1 refills | Status: DC

## 2022-01-26 NOTE — Telephone Encounter (Signed)
Patient mother [name] called said his eyes are bothering him, used eye drops last night help some, maybe dry eyes, he forgot to mention to Dr [name] today at his appointment.  Not sure what is going on with his eyes.Please contact patient .

## 2022-01-26 NOTE — Patient Instructions (Addendum)
F/U as before , call if you need me sooner  I have sent in for mounjaro in place of ozempic, let me know if you cannot get this, I will increase the dose of ozempic  I am referring  you to the  weight loss clinic in [facility] (nurse please enter if able)  Thanks for choosing [facility], we consider it a privelige to serve you.

## 2022-01-28 ENCOUNTER — Other Ambulatory Visit: Payer: Self-pay | Admitting: Cardiology

## 2022-01-30 ENCOUNTER — Encounter: Payer: Self-pay | Admitting: Family Medicine

## 2022-01-30 NOTE — Assessment & Plan Note (Signed)
Hyperlipidemia:Low fat diet discussed and encouraged.   Lipid Panel  Lab Results  Component Value Date   CHOL 131 10/04/2021   HDL 35 (L) 10/04/2021   LDLCALC 79 10/04/2021   TRIG 91 10/04/2021   CHOLHDL 3.7 10/04/2021     Controlled, no change in medication Needs to increase exercise

## 2022-01-30 NOTE — Assessment & Plan Note (Signed)
Controlled, no change in medication  

## 2022-01-30 NOTE — Assessment & Plan Note (Signed)
Shawn Ramsey is reminded of the importance of commitment to daily physical activity for 30 minutes or more, as able and the need to limit carbohydrate intake to 30 to 60 grams per meal to help with blood sugar control.   The need to take medication as prescribed, test blood sugar as directed, and to call between visits if there is a concern that blood sugar is uncontrolled is also discussed.   Shawn Ramsey is reminded of the importance of daily foot exam, annual eye examination, and good blood sugar, blood pressure and cholesterol control.     Latest Ref Rng & Units 10/04/2021    2:18 PM 04/22/2021   11:24 AM 02/14/2021   10:59 PM 02/14/2021   10:53 PM 12/15/2020   11:58 AM  Diabetic Labs  HbA1c 4.8 - 5.6 % 5.5  5.7    5.7   Micro/Creat Ratio 0 - 29 mg/g creat 3       Chol 100 - 199 mg/dL 131  147    134   HDL >39 mg/dL 35  37    31   Calc LDL 0 - 99 mg/dL 79  87    84   Triglycerides 0 - 149 mg/dL 91  127    99   Creatinine 0.76 - 1.27 mg/dL  0.73  0.92  0.80  0.93       01/26/2022    8:37 AM 01/26/2022    8:06 AM 01/06/2022   11:44 AM 11/04/2021    2:55 PM 10/01/2021    3:39 PM 09/30/2021   12:46 PM 09/29/2021    6:39 PM  BP/Weight  Systolic BP  015 615  379 432 761  Diastolic BP  75 80  80 99 91  Wt. (Lbs) 270 276.04 275.8  265  267  BMI 44.93 kg/m2 45.94 kg/m2 45.9 kg/m2  44.1 kg/m2  44.43 kg/m2     Information is confidential and restricted. Go to Review Flowsheets to unlock data.      04/20/2021   10:20 AM 01/09/2018    4:00 PM  Foot/eye exam completion dates  Foot Form Completion Done Done   Rx sent formounjaro

## 2022-01-30 NOTE — Progress Notes (Signed)
Shawn Ramsey     MRN: [id number]      DOB: 12-23-1980   HPI Shawn Ramsey is here for follow up and re-evaluation of chronic medical conditions, medication management and review of any available recent lab and radiology data.  Preventive health is updated, specifically  Cancer screening and Immunization.   Questions or concerns regarding consultations or procedures which the PT has had in the interim are  addressed. The PT is requesting mounjaro foe weight loss as feels this will be more beneficial  ROS Denies recent fever or chills. Denies sinus pressure, nasal congestion, ear pain or sore throat. Denies chest congestion, productive cough or wheezing. Denies chest pains, palpitations and leg swelling Denies abdominal pain, nausea, vomiting,diarrhea or constipation.   Denies dysuria, frequency, hesitancy or incontinence. Denies uncontrolled  joint pain, swelling and limitation in mobility. Denies headaches, seizures, numbness, or tingling. Denies depression, anxiety or insomnia. Denies skin break down or rash.   PE  BP 137/75 (BP Location: Right Arm, Patient Position: Sitting, Cuff Size: Large)   Pulse 86   Ht '5\' 5"'$  (1.651 m)   Wt 270 lb (122.5 kg)   SpO2 93%   BMI 44.93 kg/m   Patient alert and oriented and in no cardiopulmonary distress.  HEENT: No facial asymmetry, EOMI,     Neck supple .  Chest: Clear to auscultation bilaterally.  CVS: S1, S2 no murmurs, no S3.Regular rate.  ABD: Soft non tender.   Ext: No edema  MS: Adequate ROM spine, shoulders, hips and knees.  Skin: Intact, no ulcerations or rash noted.  Psych: Good eye contact, normal affect. Memory intact not anxious or depressed appearing.  CNS: CN 2-12 intact, power,  normal throughout.no focal deficits noted.   Assessment & Plan  Chronic diastolic heart failure (HCC) Stable no s/s of decompensation, no med change  Essential hypertension Controlled, no change in medication DASH diet and  commitment to daily physical activity for a minimum of 30 minutes discussed and encouraged, as a part of hypertension management. The importance of attaining a healthy weight is also discussed.     01/26/2022    8:37 AM 01/26/2022    8:06 AM 01/06/2022   11:44 AM 11/04/2021    2:55 PM 10/01/2021    3:39 PM 09/30/2021   12:46 PM 09/29/2021    6:39 PM  BP/Weight  Systolic BP  956 387  564 332 951  Diastolic BP  75 80  80 99 91  Wt. (Lbs) 270 276.04 275.8  265  267  BMI 44.93 kg/m2 45.94 kg/m2 45.9 kg/m2  44.1 kg/m2  44.43 kg/m2     Information is confidential and restricted. Go to Review Flowsheets to unlock data.       Cough variant asthma Controlled, no change in medication   Hyperlipidemia associated with type 2 diabetes mellitus ([place]) Hyperlipidemia:Low fat diet discussed and encouraged.   Lipid Panel  Lab Results  Component Value Date   CHOL 131 10/04/2021   HDL 35 (L) 10/04/2021   LDLCALC 79 10/04/2021   TRIG 91 10/04/2021   CHOLHDL 3.7 10/04/2021     Controlled, no change in medication Needs to increase exercise  Diabetes mellitus type 2 in obese [facility]) Shawn Ramsey is reminded of the importance of commitment to daily physical activity for 30 minutes or more, as able and the need to limit carbohydrate intake to 30 to 60 grams per meal to help with blood sugar control.   The need to take  medication as prescribed, test blood sugar as directed, and to call between visits if there is a concern that blood sugar is uncontrolled is also discussed.   Shawn Ramsey is reminded of the importance of daily foot exam, annual eye examination, and good blood sugar, blood pressure and cholesterol control.     Latest Ref Rng & Units 10/04/2021    2:18 PM 04/22/2021   11:24 AM 02/14/2021   10:59 PM 02/14/2021   10:53 PM 12/15/2020   11:58 AM  Diabetic Labs  HbA1c 4.8 - 5.6 % 5.5  5.7    5.7   Micro/Creat Ratio 0 - 29 mg/g creat 3       Chol 100 - 199 mg/dL 131  147    134   HDL >39  mg/dL 35  37    31   Calc LDL 0 - 99 mg/dL 79  87    84   Triglycerides 0 - 149 mg/dL 91  127    99   Creatinine 0.76 - 1.27 mg/dL  0.73  0.92  0.80  0.93       01/26/2022    8:37 AM 01/26/2022    8:06 AM 01/06/2022   11:44 AM 11/04/2021    2:55 PM 10/01/2021    3:39 PM 09/30/2021   12:46 PM 09/29/2021    6:39 PM  BP/Weight  Systolic BP  846 962  952 841 324  Diastolic BP  75 80  80 99 91  Wt. (Lbs) 270 276.04 275.8  265  267  BMI 44.93 kg/m2 45.94 kg/m2 45.9 kg/m2  44.1 kg/m2  44.43 kg/m2     Information is confidential and restricted. Go to Review Flowsheets to unlock data.      04/20/2021   10:20 AM 01/09/2018    4:00 PM  Foot/eye exam completion dates  Foot Form Completion Done Done   Rx sent formounjaro     Morbid obesity due to excess calories [facility])  Patient re-educated about  the importance of commitment to a  minimum of 150 minutes of exercise per week as able.  The importance of healthy food choices with portion control discussed, as well as eating regularly and within a 12 hour window most days. The need to choose "clean , green" food 50 to 75% of the time is discussed, as well as to make water the primary drink and set a goal of 64 ounces water daily.       01/26/2022    8:37 AM 01/26/2022    8:06 AM 01/06/2022   11:44 AM  Weight /BMI  Weight 270 lb 276 lb 0.6 oz 275 lb 12.8 oz  Height  '5\' 5"'$  (1.651 m) '5\' 5"'$  (1.651 m)  BMI 44.93 kg/m2 45.94 kg/m2 45.9 kg/m2

## 2022-01-30 NOTE — Assessment & Plan Note (Signed)
Controlled, no change in medication DASH diet and commitment to daily physical activity for a minimum of 30 minutes discussed and encouraged, as a part of hypertension management. The importance of attaining a healthy weight is also discussed.     01/26/2022    8:37 AM 01/26/2022    8:06 AM 01/06/2022   11:44 AM 11/04/2021    2:55 PM 10/01/2021    3:39 PM 09/30/2021   12:46 PM 09/29/2021    6:39 PM  BP/Weight  Systolic BP  591 638  466 599 357  Diastolic BP  75 80  80 99 91  Wt. (Lbs) 270 276.04 275.8  265  267  BMI 44.93 kg/m2 45.94 kg/m2 45.9 kg/m2  44.1 kg/m2  44.43 kg/m2     Information is confidential and restricted. Go to Review Flowsheets to unlock data.

## 2022-01-30 NOTE — Assessment & Plan Note (Signed)
Stable no s/s of decompensation, no med change

## 2022-01-30 NOTE — Assessment & Plan Note (Signed)
  Patient re-educated about  the importance of commitment to a  minimum of 150 minutes of exercise per week as able.  The importance of healthy food choices with portion control discussed, as well as eating regularly and within a 12 hour window most days. The need to choose "clean , green" food 50 to 75% of the time is discussed, as well as to make water the primary drink and set a goal of 64 ounces water daily.       01/26/2022    8:37 AM 01/26/2022    8:06 AM 01/06/2022   11:44 AM  Weight /BMI  Weight 270 lb 276 lb 0.6 oz 275 lb 12.8 oz  Height  '5\' 5"'$  (1.651 m) '5\' 5"'$  (1.651 m)  BMI 44.93 kg/m2 45.94 kg/m2 45.9 kg/m2

## 2022-01-31 MED ORDER — AZELASTINE HCL 0.05 % OP SOLN: 1.0000 [drp] | Freq: Two times a day (BID) | OPHTHALMIC | 2 refills | Status: DC

## 2022-01-31 NOTE — Telephone Encounter (Signed)
Med sent to pharmacy.

## 2022-02-16 ENCOUNTER — Ambulatory Visit (INDEPENDENT_AMBULATORY_CARE_PROVIDER_SITE_OTHER): Payer: Medicaid Other | Admitting: Family Medicine

## 2022-02-16 ENCOUNTER — Encounter: Payer: Self-pay | Admitting: Family Medicine

## 2022-02-16 VITALS — BP 110/68 | HR 94 | Ht 65.0 in | Wt 267.1 lb

## 2022-02-16 DIAGNOSIS — Z0001 Encounter for general adult medical examination with abnormal findings: Secondary | ICD-10-CM | POA: Insufficient documentation

## 2022-02-16 DIAGNOSIS — G8929 Other chronic pain: Secondary | ICD-10-CM

## 2022-02-16 DIAGNOSIS — E669 Obesity, unspecified: Secondary | ICD-10-CM

## 2022-02-16 DIAGNOSIS — E785 Hyperlipidemia, unspecified: Secondary | ICD-10-CM | POA: Diagnosis not present

## 2022-02-16 DIAGNOSIS — M25562 Pain in left knee: Secondary | ICD-10-CM | POA: Diagnosis not present

## 2022-02-16 DIAGNOSIS — I10 Essential (primary) hypertension: Secondary | ICD-10-CM

## 2022-02-16 DIAGNOSIS — E1169 Type 2 diabetes mellitus with other specified complication: Secondary | ICD-10-CM

## 2022-02-16 MED ORDER — TORSEMIDE 20 MG PO TABS: 20.0000 mg | ORAL_TABLET | Freq: Two times a day (BID) | ORAL | 3 refills | Status: DC

## 2022-02-16 MED ORDER — TIRZEPATIDE 7.5 MG/0.5ML ~~LOC~~ SOAJ: 7.5000 mg | SUBCUTANEOUS | 2 refills | Status: DC

## 2022-02-16 MED ORDER — VITAMIN D (ERGOCALCIFEROL) 1.25 MG (50000 UNIT) PO CAPS: 50000.0000 [IU] | ORAL_CAPSULE | ORAL | 6 refills | Status: DC

## 2022-02-16 NOTE — Assessment & Plan Note (Signed)

## 2022-02-16 NOTE — Patient Instructions (Addendum)
F/U in 8 weeks, call if you need me sooner  Weight; loss goal of 8 to 12 pounds  Higher dose mounjaro next week is 7.5 mg weekly  Need to commit to exercise 30 mins 5 days per week or more  Stay away from white starchy foods , and sweets and sweet drinks  Fasting lipid, cmp and EGFr and hBA1C 3 to 5 days before next appt  Thanks for choosing [facility], we consider it a privelige to serve you.  Best for 2024!

## 2022-02-16 NOTE — Assessment & Plan Note (Signed)
  Patient re-educated about  the importance of commitment to a  minimum of 150 minutes of exercise per week as able.  The importance of healthy food choices with portion control discussed, as well as eating regularly and within a 12 hour window most days. The need to choose "clean , green" food 50 to 75% of the time is discussed, as well as to make water the primary drink and set a goal of 64 ounces water daily.       02/16/2022    4:27 PM 01/26/2022    8:37 AM 01/26/2022    8:06 AM  Weight /BMI  Weight 267 lb 1.9 oz 270 lb 276 lb 0.6 oz  Height '5\' 5"'$  (1.651 m)  '5\' 5"'$  (1.651 m)  BMI 44.45 kg/m2 44.93 kg/m2 45.94 kg/m2   Dose incrin mounjaro,  reval in 8 weeks, increase exercise

## 2022-02-16 NOTE — Assessment & Plan Note (Signed)
Increased , no recent trama and able to weight bear, arthritis flare , recommend tylenol 500 mg twice daily, and weight loss

## 2022-02-16 NOTE — Progress Notes (Signed)
   Shawn Ramsey     MRN: [id number]      DOB: 1980/02/28   HPI: Patient is in for annual physical exam. C/o left knee pain, npo inciting trauma, thinks related to arthritis flare with cold weather, Mounjaro dose increased to assist with weight loss. advised use of tylenol for knee pain.    PE; BP 110/68 (BP Location: Right Arm, Patient Position: Sitting, Cuff Size: Large)   Pulse 94   Ht '5\' 5"'$  (1.651 m)   Wt 267 lb 1.9 oz (121.2 kg)   SpO2 96%   BMI 44.45 kg/m   Pleasant male, alert and oriented x 3, in no cardio-pulmonary distress. Afebrile. HEENT No facial trauma or asymetry. Sinuses non tender. EOMI External ears normal,  Neck: supple, no adenopathy,JVD or thyromegaly.No bruits.  Chest: Clear to ascultation bilaterally.No crackles or wheezes. Non tender to palpation  Cardiovascular system; Heart sounds normal,  S1 and  S2 ,no S3.  No murmur, or thrill. .  Abdomen: Soft, non tender,  Musculoskeletal exam: Full ROM of spine, hips , shoulders and  reduced in left knees. No deformity ,swelling or crepitus noted. No muscle wasting or atrophy.   Neurologic: Cranial nerves 2 to 12 intact. Power, tone ,sensation and reflexes normal throughout. No disturbance in gait. No tremor.  Skin: Intact, no ulceration, erythema , scaling or rash noted. Pigmentation normal throughout  Psych; Normal mood and affect. Judgement and concentration normal   Assessment & Plan:  Annual visit for general adult medical examination with abnormal findings Annual exam as documented. Counseling done  re healthy lifestyle involving commitment to 150 minutes exercise per week, heart healthy diet, and attaining healthy weight.The importance of adequate sleep also discussed. Regular seat belt use and home safety, is also discussed. Changes in health habits are decided on by the patient with goals and time frames  set for achieving them. Immunization and cancer screening needs are  specifically addressed at this visit.   Morbid obesity due to excess calories [facility])  Patient re-educated about  the importance of commitment to a  minimum of 150 minutes of exercise per week as able.  The importance of healthy food choices with portion control discussed, as well as eating regularly and within a 12 hour window most days. The need to choose "clean , green" food 50 to 75% of the time is discussed, as well as to make water the primary drink and set a goal of 64 ounces water daily.       02/16/2022    4:27 PM 01/26/2022    8:37 AM 01/26/2022    8:06 AM  Weight /BMI  Weight 267 lb 1.9 oz 270 lb 276 lb 0.6 oz  Height '5\' 5"'$  (1.651 m)  '5\' 5"'$  (1.651 m)  BMI 44.45 kg/m2 44.93 kg/m2 45.94 kg/m2   Dose incrin mounjaro,  reval in 8 weeks, increase exercise   Left knee pain Increased , no recent trama and able to weight bear, arthritis flare , recommend tylenol 500 mg twice daily, and weight loss

## 2022-03-03 ENCOUNTER — Ambulatory Visit: Payer: Medicaid Other | Admitting: Pulmonary Disease

## 2022-03-08 ENCOUNTER — Encounter (INDEPENDENT_AMBULATORY_CARE_PROVIDER_SITE_OTHER): Payer: Self-pay
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
## 2022-03-10 ENCOUNTER — Telehealth: Payer: Self-pay | Admitting: Family Medicine

## 2022-03-10 ENCOUNTER — Inpatient Hospital Stay (HOSPITAL_COMMUNITY): Admission: RE | Admit: 2022-03-10 | Payer: Medicaid Other | Source: Ambulatory Visit

## 2022-03-10 NOTE — Telephone Encounter (Signed)
Prescription Request  03/10/2022  Is this a "Controlled Substance" medicine? No  LOV: 02/16/2022  What is the name of the medication or equipment? torsemide (DEMADEX) 20 MG tablet  sildenafil (VIAGRA) 100 MG tablet   Have you contacted your pharmacy to request a refill? No   Which pharmacy would you like this sent to?  [facility] [address], [place] [address] Phone: [phone] Fax: [phone]    Patient notified that their request is being sent to the clinical staff for review and that they should receive a response within 2 business days.   Please advise at [place]

## 2022-03-11 ENCOUNTER — Other Ambulatory Visit: Payer: Self-pay

## 2022-03-11 MED ORDER — SILDENAFIL CITRATE 100 MG PO TABS: 50.0000 mg | ORAL_TABLET | Freq: Every day | ORAL | 11 refills | Status: DC | PRN

## 2022-03-11 MED ORDER — TORSEMIDE 20 MG PO TABS: 20.0000 mg | ORAL_TABLET | Freq: Two times a day (BID) | ORAL | 3 refills | Status: DC

## 2022-03-11 NOTE — Telephone Encounter (Signed)
Refills sent

## 2022-03-12 ENCOUNTER — Other Ambulatory Visit: Payer: Self-pay

## 2022-03-12 ENCOUNTER — Emergency Department (HOSPITAL_COMMUNITY)
Admission: EM | Admit: 2022-03-12 | Discharge: 2022-03-13 | Disposition: A | Discharge status: Home / Self Care | Payer: Medicaid Other | Attending: Emergency Medicine | Admitting: Emergency Medicine

## 2022-03-12 DIAGNOSIS — Z430 Encounter for attention to tracheostomy: Secondary | ICD-10-CM | POA: Diagnosis present

## 2022-03-12 NOTE — ED Triage Notes (Signed)
Pt via POV states needs trach changed. Pt has supplies with him at this time. Last changed December. No SOB. Airway intact.

## 2022-03-13 NOTE — Progress Notes (Signed)
RT explained to pt. That we would not be able to change out his Trache. Pt. Brought in the wrong trache and we do not have access to the trache he needs. Pt. Is not in any distress, sats 96% and just wanted a routine change. Pt. States he will wait til his next appointment to get a new trache. MD aware.

## 2022-03-13 NOTE — ED Provider Notes (Signed)
[facility] Provider Note   CSN: [id number] Arrival date & time: 03/12/22  2129     History  Chief Complaint  Patient presents with   Tracheostomy Tube Change    Shawn Ramsey is a 42 y.o. male.  42 yo M here for trach change. No discharge. No cough. No dyspnea. Missed an appointment and has another appt on Jan 24 but would like it changed today. No fever        Home Medications Prior to Admission medications   Medication Sig Start Date End Date Taking? Authorizing Provider  albuterol (PROVENTIL) (2.5 MG/3ML) 0.083% nebulizer solution Take 3 mLs (2.5 mg total) by nebulization every 6 (six) hours as needed for wheezing or shortness of breath. 04/22/21   Chesley Mires, MD  albuterol (VENTOLIN HFA) 108 (90 Base) MCG/ACT inhaler Inhale 1-2 puffs into the lungs every 6 (six) hours as needed for wheezing or shortness of breath. 08/26/21   Fayrene Helper, MD  atorvastatin (LIPITOR) 40 MG tablet Take 1 tablet by mouth once daily 09/07/21   Fayrene Helper, MD  azelastine (ASTELIN) 0.1 % nasal spray Place 2 sprays into both nostrils 2 (two) times daily. Use in each nostril as directed 11/19/21   Fayrene Helper, MD  azelastine (OPTIVAR) 0.05 % ophthalmic solution Place 1 drop into both eyes 2 (two) times daily. 01/31/22   Fayrene Helper, MD  ibuprofen (ADVIL) 800 MG tablet Take 800 mg by mouth every 8 (eight) hours as needed.    [provider]  melatonin 5 MG TABS Take 1 tablet (5 mg total) by mouth at bedtime. 07/15/20   Fayrene Helper, MD  naltrexone (DEPADE) 50 MG tablet Take 25 mg by mouth daily.    [provider]  potassium chloride SA (KLOR-CON M) 20 MEQ tablet Take 1 tablet (20 mEq total) by mouth 2 (two) times daily. 11/19/21   Fayrene Helper, MD  PULMICORT 0.25 MG/2ML nebulizer solution Take 2 mLs (0.25 mg total) by nebulization in the morning and at bedtime. 04/22/21   Chesley Mires, MD   sertraline (ZOLOFT) 100 MG tablet Take 2 tablets (200 mg total) by mouth daily. 01/08/22 04/08/22  [name], MD  sildenafil (VIAGRA) 100 MG tablet Take 0.5-1 tablets (50-100 mg total) by mouth daily as needed for erectile dysfunction. 03/11/22   Fayrene Helper, MD  tirzepatide [facility]) 7.5 MG/0.5ML Pen Inject 7.5 mg into the skin once a week. 02/23/22   Fayrene Helper, MD  torsemide (DEMADEX) 20 MG tablet Take 1 tablet (20 mg total) by mouth 2 (two) times daily. 03/11/22   Fayrene Helper, MD  traZODone (DESYREL) 150 MG tablet Take 1 tablet (150 mg total) by mouth at bedtime. 07/07/21   Fayrene Helper, MD  Vitamin D, Ergocalciferol, (DRISDOL) 1.25 MG (50000 UNIT) CAPS capsule Take 1 capsule (50,000 Units total) by mouth every 7 (seven) days. 02/16/22   Fayrene Helper, MD  zolpidem (AMBIEN) 5 MG tablet Take 1 tablet (5 mg total) by mouth at bedtime as needed for sleep. 01/06/22   Chesley Mires, MD      Allergies    Patient has no known allergies.    Review of Systems   Review of Systems  Physical Exam Updated Vital Signs BP 116/71   Pulse 83   Temp 97.7 F (36.5 C) (Oral)   Resp 16   Ht '5\' 5"'$  (1.651 m)   Wt 121.6 kg  SpO2 96%   BMI 44.60 kg/m  Physical Exam Vitals and nursing note reviewed.  Constitutional:      Appearance: He is well-developed.  HENT:     Head: Normocephalic and atraumatic.  Eyes:     Pupils: Pupils are equal, round, and reactive to light.  Cardiovascular:     Rate and Rhythm: Normal rate.  Pulmonary:     Effort: Pulmonary effort is normal. No respiratory distress.  Abdominal:     General: Abdomen is flat. There is no distension.  Musculoskeletal:        General: Normal range of motion.     Cervical back: Normal range of motion.  Skin:    General: Skin is warm and dry.  Neurological:     General: No focal deficit present.     Mental Status: He is alert.     ED Results / Procedures / Treatments   Labs (all labs ordered  are listed, but only abnormal results are displayed) Labs Reviewed - No data to display  EKG None  Radiology No results found.  Procedures Procedures    Medications Ordered in ED Medications - No data to display  ED Course/ Medical Decision Making/ A&P                             Medical Decision Making  Discussed with RT. Not the correct trach. Not in distress. No indication for trach change. Can fu in clinic. Patient understanding.    Final Clinical Impression(s) / ED Diagnoses Final diagnoses:  Tracheostomy care [facility])    Rx / [place] Orders ED Discharge Orders     None         Dante Roudebush, Corene Cornea, MD 03/13/22 [phone]

## 2022-03-14 ENCOUNTER — Encounter: Payer: Self-pay | Admitting: Psychiatry

## 2022-03-14 ENCOUNTER — Telehealth (INDEPENDENT_AMBULATORY_CARE_PROVIDER_SITE_OTHER): Payer: Medicaid Other | Admitting: Psychiatry

## 2022-03-14 ENCOUNTER — Telehealth: Payer: Self-pay | Admitting: Family Medicine

## 2022-03-14 DIAGNOSIS — F1011 Alcohol abuse, in remission: Secondary | ICD-10-CM | POA: Diagnosis not present

## 2022-03-14 DIAGNOSIS — F79 Unspecified intellectual disabilities: Secondary | ICD-10-CM | POA: Diagnosis not present

## 2022-03-14 DIAGNOSIS — F3341 Major depressive disorder, recurrent, in partial remission: Secondary | ICD-10-CM | POA: Diagnosis not present

## 2022-03-14 MED ORDER — NALTREXONE HCL 50 MG PO TABS: 25.0000 mg | ORAL_TABLET | Freq: Every day | ORAL | 1 refills | Status: DC

## 2022-03-14 MED ORDER — SERTRALINE HCL 100 MG PO TABS: 200.0000 mg | ORAL_TABLET | Freq: Every day | ORAL | 1 refills | Status: DC

## 2022-03-14 NOTE — Telephone Encounter (Signed)
Called in on patient  behalf.  Wants to see if can get psych referral to [facility] on second floor. Current office moved to [place] and is too far  for patient. Wants a call back.

## 2022-03-15 ENCOUNTER — Telehealth: Payer: Self-pay

## 2022-03-15 NOTE — Telephone Encounter (Signed)
Transition Care Management Follow-up Telephone Call Date of discharge and from where: 1/21 [facility] How have you been since you were released from the hospital? fine Any questions or concerns? No  Items Reviewed: Did the pt receive and understand the discharge instructions provided? Yes  Medications obtained and verified? No  Other? Yes  Any new allergies since your discharge? No  Dietary orders reviewed? No Do you have support at home? Yes  Functional Questionnaire: (I = Independent and D = Dependent) ADLs: I  Bathing/Dressing- I  Meal Prep- I  Eating- I  Maintaining continence- I  Transferring/Ambulation- I  Managing Meds- I  Follow up appointments reviewed:  PCP Hospital f/u appt confirmed? No  . [facility] f/u appt confirmed? Yes  Scheduled to be seen 03/16/22. Are transportation arrangements needed? No  If their condition worsens, is the pt aware to call PCP or go to the Emergency Dept.? Yes Was the patient provided with contact information for the PCP's office or ED? Yes Was to pt encouraged to call back with questions or concerns? Yes

## 2022-03-16 ENCOUNTER — Ambulatory Visit (HOSPITAL_COMMUNITY)
Admission: RE | Admit: 2022-03-16 | Discharge: 2022-03-16 | Disposition: A | Discharge status: Home / Self Care | Payer: Medicaid Other | Source: Ambulatory Visit | Attending: Acute Care | Admitting: Acute Care

## 2022-03-16 ENCOUNTER — Ambulatory Visit (HOSPITAL_COMMUNITY): Payer: Medicaid Other

## 2022-03-16 DIAGNOSIS — J9611 Chronic respiratory failure with hypoxia: Secondary | ICD-10-CM

## 2022-03-16 DIAGNOSIS — G4733 Obstructive sleep apnea (adult) (pediatric): Secondary | ICD-10-CM | POA: Diagnosis not present

## 2022-03-16 DIAGNOSIS — J9610 Chronic respiratory failure, unspecified whether with hypoxia or hypercapnia: Secondary | ICD-10-CM | POA: Diagnosis present

## 2022-03-16 DIAGNOSIS — Z930 Tracheostomy status: Secondary | ICD-10-CM | POA: Diagnosis not present

## 2022-03-16 NOTE — Progress Notes (Addendum)
Tracheostomy Procedure Note  Shawn Ramsey [id number] Jan 18, 1981  Pre Procedure Tracheostomy Information  Trach Brand: Shiley Size: 6.0 6XLTUP Style: Uncuffed Secured by: Velcro   Procedure: Trach Cleaning and Trach Change    Post Procedure Tracheostomy Information  Trach Brand: Shiley Size:  6.0  6XLTUP Style: Uncuffed Secured by: Velcro   Post Procedure Evaluation:  ETCO2 positive color change from yellow to purple : Yes.   Vital signs:VSS Patients current condition: stable Complications: No apparent complications Trach site exam: clean, dry Wound care done: 4 x 4 gauze Patient did tolerate procedure well.   Education: none  Prescription needs: none    Additional needs: New PMV given to patient at this visit

## 2022-03-16 NOTE — Progress Notes (Signed)
Reason for visit  Trach management/ possible decannulation   HPI Shawn Ramsey is well known to me. I follow him for trach management in the setting of MO w/ associated OHS and OSA. Happy to report he has had significant weight loss and recently had successful PSG and now has home CPAP. We have been working on assessing for decannulation. Still not really compliant w/ his CPAP. "When I fall asleep I forget to put it on". Started on [place] for weight loss. Feels like his weight is dropping. Here today for planned trach change   Review of Systems  Constitutional: Negative.   HENT: Negative.    Eyes: Negative.   Respiratory: Negative.    Cardiovascular: Negative.   Gastrointestinal: Negative.   Genitourinary: Negative.   Musculoskeletal: Negative.   Skin: Negative.   Endo/Heme/Allergies: Negative.     Exam   General 42 year old male. Ambulatory no distress HENT NCAT no JVD trach is mid line. Neck circumference seems to be getting smaller Pulm clear  Card rrr Abd soft Ext warm and dry  Neuro intact   Procedure The 6 trach was removed. Site inspected and as recorded above. New 6 prox XLT was placed w/out difficulty over obturator. PT tolerated well. Placement confirmed via ETCO2, new trach ties affixed.   Impression/plan Principal Problem:   Tracheostomy status ([place]) Active Problems:   OSA (obstructive sleep apnea)   Morbid obesity due to excess calories (HCC)   Chronic respiratory failure (HCC)    Tracheostomy status ([place]) Overview: Followed at [facility].  Size 6 cuffless proximal XLT shiley Last change 1/24  Discussion Still not doing great w/ compliance re: CPAP  His trach is stable and he is clear for decannulation IMO once he can prove he will use his CPAP regularly   Plan ROV 8 weeks to assess for decannulation vs routine trach change   Erick Colace ACNP-BC [place] Pager # [phone] OR # [phone] if no answer        My time 17 minutes  Erick Colace ACNP-BC [place] Pager # [phone] OR # [phone] if no answer

## 2022-03-17 ENCOUNTER — Emergency Department (HOSPITAL_COMMUNITY)
Admission: EM | Admit: 2022-03-17 | Discharge: 2022-03-17 | Disposition: A | Discharge status: Home / Self Care | Payer: Medicaid Other | Source: Home / Self Care | Attending: Emergency Medicine | Admitting: Emergency Medicine

## 2022-03-17 ENCOUNTER — Encounter (HOSPITAL_COMMUNITY): Payer: Self-pay | Admitting: *Deleted

## 2022-03-17 ENCOUNTER — Other Ambulatory Visit: Payer: Self-pay

## 2022-03-17 ENCOUNTER — Encounter (HOSPITAL_COMMUNITY): Payer: Self-pay

## 2022-03-17 ENCOUNTER — Emergency Department (HOSPITAL_COMMUNITY)
Admission: EM | Admit: 2022-03-17 | Discharge: 2022-03-17 | Discharge status: Against Medical Advice | Payer: Medicaid Other | Source: Home / Self Care

## 2022-03-17 ENCOUNTER — Emergency Department (HOSPITAL_COMMUNITY)
Admission: EM | Admit: 2022-03-17 | Discharge: 2022-03-17 | Disposition: A | Discharge status: Home / Self Care | Payer: Medicaid Other | Attending: Emergency Medicine | Admitting: Emergency Medicine

## 2022-03-17 ENCOUNTER — Emergency Department (HOSPITAL_COMMUNITY): Payer: Medicaid Other

## 2022-03-17 ENCOUNTER — Encounter (HOSPITAL_COMMUNITY): Payer: Self-pay | Admitting: Emergency Medicine

## 2022-03-17 DIAGNOSIS — Z1152 Encounter for screening for COVID-19: Secondary | ICD-10-CM | POA: Insufficient documentation

## 2022-03-17 DIAGNOSIS — Z5321 Procedure and treatment not carried out due to patient leaving prior to being seen by health care provider: Secondary | ICD-10-CM | POA: Insufficient documentation

## 2022-03-17 DIAGNOSIS — J189 Pneumonia, unspecified organism: Secondary | ICD-10-CM | POA: Insufficient documentation

## 2022-03-17 DIAGNOSIS — E876 Hypokalemia: Secondary | ICD-10-CM | POA: Insufficient documentation

## 2022-03-17 DIAGNOSIS — J4489 Other specified chronic obstructive pulmonary disease: Secondary | ICD-10-CM | POA: Insufficient documentation

## 2022-03-17 DIAGNOSIS — R0602 Shortness of breath: Secondary | ICD-10-CM | POA: Insufficient documentation

## 2022-03-17 DIAGNOSIS — E119 Type 2 diabetes mellitus without complications: Secondary | ICD-10-CM | POA: Insufficient documentation

## 2022-03-17 DIAGNOSIS — J45909 Unspecified asthma, uncomplicated: Secondary | ICD-10-CM | POA: Insufficient documentation

## 2022-03-17 DIAGNOSIS — Z930 Tracheostomy status: Secondary | ICD-10-CM | POA: Insufficient documentation

## 2022-03-17 DIAGNOSIS — Z7985 Long-term (current) use of injectable non-insulin antidiabetic drugs: Secondary | ICD-10-CM | POA: Insufficient documentation

## 2022-03-17 DIAGNOSIS — Z7951 Long term (current) use of inhaled steroids: Secondary | ICD-10-CM | POA: Insufficient documentation

## 2022-03-17 DIAGNOSIS — J449 Chronic obstructive pulmonary disease, unspecified: Secondary | ICD-10-CM | POA: Insufficient documentation

## 2022-03-17 DIAGNOSIS — R059 Cough, unspecified: Secondary | ICD-10-CM | POA: Diagnosis present

## 2022-03-17 DIAGNOSIS — Z8616 Personal history of COVID-19: Secondary | ICD-10-CM | POA: Insufficient documentation

## 2022-03-17 DIAGNOSIS — F321 Major depressive disorder, single episode, moderate: Secondary | ICD-10-CM

## 2022-03-17 DIAGNOSIS — I509 Heart failure, unspecified: Secondary | ICD-10-CM | POA: Insufficient documentation

## 2022-03-17 LAB — CBC WITH DIFFERENTIAL/PLATELET
Abs Immature Granulocytes: 0.03 10*3/uL (ref 0.00–0.07)
Abs Immature Granulocytes: 0.04 10*3/uL (ref 0.00–0.07)
Basophils Absolute: 0 10*3/uL (ref 0.0–0.1)
Basophils Absolute: 0 10*3/uL (ref 0.0–0.1)
Basophils Relative: 0 %
Basophils Relative: 0 %
Eosinophils Absolute: 0.2 10*3/uL (ref 0.0–0.5)
Eosinophils Absolute: 0.2 10*3/uL (ref 0.0–0.5)
Eosinophils Relative: 2 %
Eosinophils Relative: 2 %
HCT: 38.3 % — ABNORMAL LOW (ref 39.0–52.0)
HCT: 38.4 % — ABNORMAL LOW (ref 39.0–52.0)
Hemoglobin: 13.1 g/dL (ref 13.0–17.0)
Hemoglobin: 13.1 g/dL (ref 13.0–17.0)
Immature Granulocytes: 0 %
Immature Granulocytes: 0 %
Lymphocytes Relative: 21 %
Lymphocytes Relative: 25 %
Lymphs Abs: 2 10*3/uL (ref 0.7–4.0)
Lymphs Abs: 2.4 10*3/uL (ref 0.7–4.0)
MCH: 29.6 pg (ref 26.0–34.0)
MCH: 30 pg (ref 26.0–34.0)
MCHC: 34.2 g/dL (ref 30.0–36.0)
MCHC: 34.1 g/dL (ref 30.0–36.0)
MCV: 86.7 fL (ref 80.0–100.0)
MCV: 87.9 fL (ref 80.0–100.0)
Monocytes Absolute: 0.9 10*3/uL (ref 0.1–1.0)
Monocytes Absolute: 1 10*3/uL (ref 0.1–1.0)
Monocytes Relative: 9 %
Monocytes Relative: 10 %
Neutro Abs: 6.5 10*3/uL (ref 1.7–7.7)
Neutro Abs: 5.8 10*3/uL (ref 1.7–7.7)
Neutrophils Relative %: 68 %
Neutrophils Relative %: 63 %
Platelets: 321 10*3/uL (ref 150–400)
Platelets: 307 10*3/uL (ref 150–400)
RBC: 4.42 MIL/uL (ref 4.22–5.81)
RBC: 4.37 MIL/uL (ref 4.22–5.81)
RDW: 14.7 % (ref 11.5–15.5)
RDW: 14.6 % (ref 11.5–15.5)
WBC: 9.6 10*3/uL (ref 4.0–10.5)
WBC: 9.4 10*3/uL (ref 4.0–10.5)
nRBC: 0 % (ref 0.0–0.2)
nRBC: 0 % (ref 0.0–0.2)

## 2022-03-17 LAB — BASIC METABOLIC PANEL
Anion gap: 10 (ref 5–15)
Anion gap: 10 (ref 5–15)
BUN: 11 mg/dL (ref 6–20)
BUN: 13 mg/dL (ref 6–20)
CO2: 25 mmol/L (ref 22–32)
CO2: 23 mmol/L (ref 22–32)
Calcium: 8.8 mg/dL — ABNORMAL LOW (ref 8.9–10.3)
Calcium: 8.6 mg/dL — ABNORMAL LOW (ref 8.9–10.3)
Chloride: 104 mmol/L (ref 98–111)
Chloride: 104 mmol/L (ref 98–111)
Creatinine, Ser: 0.85 mg/dL (ref 0.61–1.24)
Creatinine, Ser: 0.76 mg/dL (ref 0.61–1.24)
GFR, Estimated: >60 mL/min (ref >60)
GFR, Estimated: >60 mL/min (ref >60)
Glucose, Bld: 98 mg/dL (ref 70–99)
Glucose, Bld: 92 mg/dL (ref 70–99)
Potassium: 3.1 mmol/L — ABNORMAL LOW (ref 3.5–5.1)
Potassium: 2.8 mmol/L — ABNORMAL LOW (ref 3.5–5.1)
Sodium: 139 mmol/L (ref 135–145)
Sodium: 137 mmol/L (ref 135–145)

## 2022-03-17 LAB — RESP PANEL BY RT-PCR (RSV, FLU A&B, COVID)  RVPGX2
Influenza A by PCR: NEGATIVE
Influenza B by PCR: NEGATIVE
Resp Syncytial Virus by PCR: NEGATIVE
SARS Coronavirus 2 by RT PCR: NEGATIVE

## 2022-03-17 MED ORDER — METHYLPREDNISOLONE SODIUM SUCC 125 MG IJ SOLR
125.0000 mg | Freq: Once | INTRAMUSCULAR | Status: AC
  Administered 2022-03-17: 125 mg via INTRAVENOUS
  Filled 2022-03-17: qty 2

## 2022-03-17 MED ORDER — AMOXICILLIN-POT CLAVULANATE 875-125 MG PO TABS: 1.0000 | ORAL_TABLET | Freq: Two times a day (BID) | ORAL | 0 refills | Status: DC

## 2022-03-17 MED ORDER — ALBUTEROL SULFATE (2.5 MG/3ML) 0.083% IN NEBU
5.0000 mg | INHALATION_SOLUTION | Freq: Once | RESPIRATORY_TRACT | Status: AC
  Administered 2022-03-17: 5 mg via RESPIRATORY_TRACT
  Filled 2022-03-17: qty 6

## 2022-03-17 MED ORDER — POTASSIUM CHLORIDE CRYS ER 20 MEQ PO TBCR: 20.0000 meq | EXTENDED_RELEASE_TABLET | Freq: Two times a day (BID) | ORAL | 0 refills | Status: DC

## 2022-03-17 MED ORDER — AZITHROMYCIN 250 MG PO TABS: 250.0000 mg | ORAL_TABLET | Freq: Every day | ORAL | 0 refills | Status: DC

## 2022-03-17 MED ORDER — POTASSIUM CHLORIDE 20 MEQ PO PACK
40.0000 meq | PACK | Freq: Two times a day (BID) | ORAL | Status: DC
  Administered 2022-03-17: 40 meq via ORAL
  Filled 2022-03-17: qty 2

## 2022-03-17 MED ORDER — PREDNISONE 20 MG PO TABS: ORAL_TABLET | ORAL | 0 refills | Status: DC

## 2022-03-17 MED ORDER — IPRATROPIUM-ALBUTEROL 0.5-2.5 (3) MG/3ML IN SOLN: 3.0000 mL | Freq: Once | RESPIRATORY_TRACT | Status: DC

## 2022-03-17 NOTE — ED Notes (Signed)
Respiratory at bedside suctioning trach

## 2022-03-17 NOTE — ED Notes (Signed)
Dr. Yao at bedside. 

## 2022-03-17 NOTE — ED Provider Notes (Signed)
[place] EMERGENCY DEPARTMENT AT [facility] Provider Note   CSN: [id number] Arrival date & time: 03/17/22  2025     History  Chief Complaint  Patient presents with   Shortness of Breath    Shawn Ramsey is a 42 y.o. male here with shortness of breath.  Patient has a trach placed about 2 years ago and was replaced yesterday by pulmonology.  Patient went to [facility] today and was found to have possible pneumonia.  Patient was prescribed antibiotics.  Patient states that he has some secretion and his nebulizer is broken at home.  He states that he could not get his secretion out.  He went to [facility] and left without being seen.  Patient states that he does not usually require any oxygen during the day.  Denies any fevers or chills  The history is provided by the patient.       Home Medications Prior to Admission medications   Medication Sig Start Date End Date Taking? Authorizing Provider  albuterol (PROVENTIL) (2.5 MG/3ML) 0.083% nebulizer solution Take 3 mLs (2.5 mg total) by nebulization every 6 (six) hours as needed for wheezing or shortness of breath. 04/22/21   Chesley Mires, MD  albuterol (VENTOLIN HFA) 108 (90 Base) MCG/ACT inhaler Inhale 1-2 puffs into the lungs every 6 (six) hours as needed for wheezing or shortness of breath. 08/26/21   Fayrene Helper, MD  amoxicillin-clavulanate (AUGMENTIN) 875-125 MG tablet Take 1 tablet by mouth every 12 (twelve) hours. 03/17/22   Wilnette Kales, PA  atorvastatin (LIPITOR) 40 MG tablet Take 1 tablet by mouth once daily 09/07/21   Fayrene Helper, MD  azelastine (ASTELIN) 0.1 % nasal spray Place 2 sprays into both nostrils 2 (two) times daily. Use in each nostril as directed Patient not taking: Reported on 03/17/2022 11/19/21   Fayrene Helper, MD  azelastine (OPTIVAR) 0.05 % ophthalmic solution Place 1 drop into both eyes 2 (two) times daily. Patient not taking: Reported on 03/17/2022 01/31/22   Fayrene Helper, MD   azithromycin (ZITHROMAX) 250 MG tablet Take 1 tablet (250 mg total) by mouth daily. Take first 2 tablets together, then 1 every day until finished. 03/17/22   Dion Saucier A, PA  melatonin 5 MG TABS Take 1 tablet (5 mg total) by mouth at bedtime. 07/15/20   Fayrene Helper, MD  naltrexone (DEPADE) 50 MG tablet Take 0.5 tablets (25 mg total) by mouth daily. Patient not taking: Reported on 03/17/2022 03/14/22 05/13/22  [name], MD  potassium chloride SA (KLOR-CON M) 20 MEQ tablet Take 1 tablet (20 mEq total) by mouth 2 (two) times daily. 11/19/21   Fayrene Helper, MD  PULMICORT 0.25 MG/2ML nebulizer solution Take 2 mLs (0.25 mg total) by nebulization in the morning and at bedtime. 04/22/21   Chesley Mires, MD  sertraline (ZOLOFT) 100 MG tablet Take 2 tablets (200 mg total) by mouth daily. 04/09/22 06/08/22  [name], MD  sildenafil (VIAGRA) 100 MG tablet Take 0.5-1 tablets (50-100 mg total) by mouth daily as needed for erectile dysfunction. 03/11/22   Fayrene Helper, MD  tirzepatide [facility]) 7.5 MG/0.5ML Pen Inject 7.5 mg into the skin once a week. 02/23/22   Fayrene Helper, MD  torsemide (DEMADEX) 20 MG tablet Take 1 tablet (20 mg total) by mouth 2 (two) times daily. 03/11/22   Fayrene Helper, MD  traZODone (DESYREL) 150 MG tablet Take 1 tablet (150 mg total) by mouth at bedtime.  Patient not taking: Reported on 03/17/2022 07/07/21   Fayrene Helper, MD  Vitamin D, Ergocalciferol, (DRISDOL) 1.25 MG (50000 UNIT) CAPS capsule Take 1 capsule (50,000 Units total) by mouth every 7 (seven) days. 02/16/22   Fayrene Helper, MD  zolpidem (AMBIEN) 5 MG tablet Take 1 tablet (5 mg total) by mouth at bedtime as needed for sleep. 01/06/22   Chesley Mires, MD      Allergies    Patient has no known allergies.    Review of Systems   Review of Systems  Respiratory:  Positive for cough and shortness of breath.   All other systems reviewed and are negative.   Physical  Exam Updated Vital Signs BP (!) 149/85 (BP Location: Left Arm)   Pulse 93   Temp 99.2 F (37.3 C)   Resp 18   Ht '5\' 5"'$  (1.651 m)   Wt 114 kg   SpO2 99%   BMI 41.82 kg/m  Physical Exam Vitals and nursing note reviewed.  HENT:     Head: Normocephalic.  Eyes:     Pupils: Pupils are equal, round, and reactive to light.  Neck:     Comments: Trach in place with some secretions.  No evidence of subcutaneous air Cardiovascular:     Rate and Rhythm: Normal rate and regular rhythm.  Pulmonary:     Comments: Tachypneic and mild diffuse wheezing Abdominal:     General: Bowel sounds are normal.     Palpations: Abdomen is soft.  Musculoskeletal:     Cervical back: Normal range of motion and neck supple.  Skin:    Capillary Refill: Capillary refill takes less than 2 seconds.  Neurological:     General: No focal deficit present.     Mental Status: He is alert and oriented to person, place, and time.  Psychiatric:        Mood and Affect: Mood normal.        Behavior: Behavior normal.     ED Results / Procedures / Treatments   Labs (all labs ordered are listed, but only abnormal results are displayed) Labs Reviewed  RESP PANEL BY RT-PCR (RSV, FLU A&B, COVID)  [id number]  CBC WITH DIFFERENTIAL/PLATELET  BASIC METABOLIC PANEL    EKG None  Radiology DG Chest 2 View  Result Date: 03/17/2022 CLINICAL DATA:  shortness of breath EXAM: CHEST - 2 VIEW COMPARISON:  Same day study FINDINGS: Tracheostomy tube remains in place. Heart size within normal limits. Similar streaky right basilar opacity. No new airspace consolidation. No pleural effusion or pneumothorax. IMPRESSION: Similar streaky right basilar opacity, likely atelectasis. Electronically Signed   By: Davina Poke D.O.   On: 03/17/2022 15:37   DG Chest 2 View  Result Date: 03/17/2022 CLINICAL DATA:  Cough EXAM: CHEST - 2 VIEW COMPARISON:  CXR 02/14/21 FINDINGS: Tracheostomy in place. No pleural effusion. No pneumothorax.  Bibasilar reticular opacities are new from prior exam, and could represent atelectasis or atypical infection. Normal cardiac and mediastinal contours. No displaced rib fractures. Visualized upper abdomen is unremarkable. Vertebral body heights are maintained. IMPRESSION: Bibasilar reticular opacities are new from prior exam, and could represent atelectasis or atypical infection. Electronically Signed   By: Marin Roberts M.D.   On: 03/17/2022 12:03    Procedures Procedures    Medications Ordered in ED Medications  albuterol (PROVENTIL) (2.5 MG/3ML) 0.083% nebulizer solution 5 mg (has no administration in time range)  methylPREDNISolone sodium succinate (SOLU-MEDROL) 125 mg/2 mL injection 125 mg (has no administration  in time range)    ED Course/ Medical Decision Making/ A&P                             Medical Decision Making Mickal CONLEE SLITER is a 42 y.o. male here presenting with cough and wheezing and increased secretions through the trach.  Respiratory is at bedside and will suction the trach.  Will also try to loosen secretions with albuterol neb through the trach.  Patient had chest x-ray done earlier today that showed right atelectasis versus pneumonia.  He also had lab work done earlier today that showed normal white count.  I think his main issue likely is bronchitis versus early pneumonia.  Will recheck CBC and will give Solu-Medrol and albuterol nebs.  10:06 PM I reviewed patient's lab's and RSV and flu test.  Potassium was 3.1 earlier today and now it is down to 2.8.  Potassium was supplemented.  Patient is feeling much better after Solu-Medrol and albuterol.  Patient does have a nebulizer at home and he states that he can replace the tubing.  He states that he has some neck swelling after the trach replacement yesterday.  Will discharge home with a course of steroids and potassium.  Problems Addressed: Hypokalemia: acute illness or injury Shortness of breath: acute illness or  injury Tracheostomy in place [facility]): acute illness or injury  Amount and/or Complexity of Data Reviewed Labs: ordered. Decision-making details documented in ED Course.  Risk Prescription drug management.    Final Clinical Impression(s) / ED Diagnoses Final diagnoses:  None    Rx / DC Orders ED Discharge Orders     None         Drenda Freeze, MD 03/17/22 2208

## 2022-03-17 NOTE — Discharge Instructions (Signed)
Your potassium is low and I refilled your potassium tablets  Take prednisone as prescribed.  Use albuterol every 4 hours as needed  See your doctor for follow-up.  Return to ER if you have worse shortness of breath, trouble breathing, worse neck swelling

## 2022-03-17 NOTE — ED Provider Notes (Signed)
[place] [facility] Provider Note   CSN: [id number] Arrival date & time: 03/17/22  1036     History  No chief complaint on file.   Shawn Ramsey is a 42 y.o. male.  HPI   42 year old male presents emergency department with complaints of trach malfunction.  Patient recently had trach exchanged yesterday by respiratory therapy.  Prior to HPI, patient had trach irrigated and suctioned by respiratory therapy of which patient states his symptoms have improved some.  States acute onset feelings of trach site clogging this morning after awakening.  Reports cough/nasal congestion over the past 2 to 3 days but denies fever, chest pain, abdominal pain, nausea, vomiting, urinary symptoms, change in bowel habits.  Past medical history significant for COVID-19, COPD, morbid obesity, OSA, diabetes mellitus type 2, hyperlipidemia, tracheostomy, metabolic syndrome, CHF  Home Medications Prior to Admission medications   Medication Sig Start Date End Date Taking? Authorizing Provider  albuterol (PROVENTIL) (2.5 MG/3ML) 0.083% nebulizer solution Take 3 mLs (2.5 mg total) by nebulization every 6 (six) hours as needed for wheezing or shortness of breath. 04/22/21  Yes Chesley Mires, MD  amoxicillin-clavulanate (AUGMENTIN) 875-125 MG tablet Take 1 tablet by mouth every 12 (twelve) hours. 03/17/22  Yes Dion Saucier A, PA  atorvastatin (LIPITOR) 40 MG tablet Take 1 tablet by mouth once daily 09/07/21  Yes Fayrene Helper, MD  azithromycin (ZITHROMAX) 250 MG tablet Take 1 tablet (250 mg total) by mouth daily. Take first 2 tablets together, then 1 every day until finished. 03/17/22  Yes Dion Saucier A, PA  melatonin 5 MG TABS Take 1 tablet (5 mg total) by mouth at bedtime. 07/15/20  Yes Fayrene Helper, MD  potassium chloride SA (KLOR-CON M) 20 MEQ tablet Take 1 tablet (20 mEq total) by mouth 2 (two) times daily. 11/19/21  Yes Fayrene Helper, MD  PULMICORT 0.25  MG/2ML nebulizer solution Take 2 mLs (0.25 mg total) by nebulization in the morning and at bedtime. 04/22/21  Yes Chesley Mires, MD  sertraline (ZOLOFT) 100 MG tablet Take 2 tablets (200 mg total) by mouth daily. 04/09/22 06/08/22 Yes Hisada, Elie Goody, MD  tirzepatide [facility]) 7.5 MG/0.5ML Pen Inject 7.5 mg into the skin once a week. 02/23/22  Yes Fayrene Helper, MD  torsemide (DEMADEX) 20 MG tablet Take 1 tablet (20 mg total) by mouth 2 (two) times daily. 03/11/22  Yes Fayrene Helper, MD  Vitamin D, Ergocalciferol, (DRISDOL) 1.25 MG (50000 UNIT) CAPS capsule Take 1 capsule (50,000 Units total) by mouth every 7 (seven) days. 02/16/22  Yes Fayrene Helper, MD  zolpidem (AMBIEN) 5 MG tablet Take 1 tablet (5 mg total) by mouth at bedtime as needed for sleep. 01/06/22  Yes Chesley Mires, MD  albuterol (VENTOLIN HFA) 108 (90 Base) MCG/ACT inhaler Inhale 1-2 puffs into the lungs every 6 (six) hours as needed for wheezing or shortness of breath. 08/26/21   Fayrene Helper, MD  azelastine (ASTELIN) 0.1 % nasal spray Place 2 sprays into both nostrils 2 (two) times daily. Use in each nostril as directed Patient not taking: Reported on 03/17/2022 11/19/21   Fayrene Helper, MD  azelastine (OPTIVAR) 0.05 % ophthalmic solution Place 1 drop into both eyes 2 (two) times daily. Patient not taking: Reported on 03/17/2022 01/31/22   Fayrene Helper, MD  naltrexone (DEPADE) 50 MG tablet Take 0.5 tablets (25 mg total) by mouth daily. Patient not taking: Reported on 03/17/2022 03/14/22 05/13/22  Hisada,  Elie Goody, MD  sildenafil (VIAGRA) 100 MG tablet Take 0.5-1 tablets (50-100 mg total) by mouth daily as needed for erectile dysfunction. 03/11/22   Fayrene Helper, MD  traZODone (DESYREL) 150 MG tablet Take 1 tablet (150 mg total) by mouth at bedtime. Patient not taking: Reported on 03/17/2022 07/07/21   Fayrene Helper, MD      Allergies    Patient has no known allergies.    Review of Systems   Review of  Systems  All other systems reviewed and are negative.   Physical Exam Updated Vital Signs BP 116/67   Pulse 83   Temp 98.7 F (37.1 C) (Oral)   Resp 20   Ht '5\' 5"'$  (1.651 m)   Wt 114.8 kg   SpO2 98%   BMI 42.10 kg/m  Physical Exam Vitals and nursing note reviewed.  Constitutional:      General: He is not in acute distress.    Appearance: He is well-developed.  HENT:     Head: Normocephalic and atraumatic.  Eyes:     Conjunctiva/sclera: Conjunctivae normal.  Neck:     Comments: Patient with tracheostomy site intact.  No surrounding erythema, indurated skin, discharge appreciated.  No active drainage appreciated from tracheostomy tube itself.  Tube not occluded.  Patient no acute respiratory distress. Cardiovascular:     Rate and Rhythm: Normal rate and regular rhythm.     Heart sounds: No murmur heard. Pulmonary:     Effort: Pulmonary effort is normal. No respiratory distress.     Breath sounds: Normal breath sounds. No stridor. No wheezing, rhonchi or rales.  Abdominal:     Palpations: Abdomen is soft.     Tenderness: There is no abdominal tenderness.  Musculoskeletal:        General: No swelling.     Cervical back: Neck supple.  Skin:    General: Skin is warm and dry.     Capillary Refill: Capillary refill takes less than 2 seconds.  Neurological:     Mental Status: He is alert.  Psychiatric:        Mood and Affect: Mood normal.     ED Results / Procedures / Treatments   Labs (all labs ordered are listed, but only abnormal results are displayed) Labs Reviewed - No data to display  EKG None  Radiology DG Chest 2 View  Result Date: 03/17/2022 CLINICAL DATA:  Cough EXAM: CHEST - 2 VIEW COMPARISON:  CXR 02/14/21 FINDINGS: Tracheostomy in place. No pleural effusion. No pneumothorax. Bibasilar reticular opacities are new from prior exam, and could represent atelectasis or atypical infection. Normal cardiac and mediastinal contours. No displaced rib fractures.  Visualized upper abdomen is unremarkable. Vertebral body heights are maintained. IMPRESSION: Bibasilar reticular opacities are new from prior exam, and could represent atelectasis or atypical infection. Electronically Signed   By: Marin Roberts M.D.   On: 03/17/2022 12:03    Procedures Procedures    Medications Ordered in ED Medications - No data to display  ED Course/ Medical Decision Making/ A&P                             Medical Decision Making Amount and/or Complexity of Data Reviewed Radiology: ordered.  Risk Prescription drug management.   This patient presents to the ED for concern of tracheostomy problem, this involves an extensive number of treatment options, and is a complaint that carries with it a high risk of complications and  morbidity.  The differential diagnosis includes tracheostomy occlusion, pneumonia, COPD/asthma exacerbation, cellulitis, erysipelas, aspiration pneumonia   Co morbidities that complicate the patient evaluation  See HPI   Additional history obtained:  Additional history obtained from EMR External records from outside source obtained and reviewed including hospital records   Lab Tests:  N/a   Imaging Studies ordered:  I ordered imaging studies including chest x-ray I independently visualized and interpreted imaging which showed bibasilar reticular opacities. I agree with the radiologist interpretation  Cardiac Monitoring: / EKG:  The patient was maintained on a cardiac monitor.  I personally viewed and interpreted the cardiac monitored which showed an underlying rhythm of: Sinus rhythm   Consultations Obtained:  N/a   Problem List / ED Course / Critical interventions / Medication management  Atypical respiratory infection Reevaluation of the patient showed that the patient stayed the same I have reviewed the patients home medicines and have made adjustments as needed   Social Determinants of Health:  Former cigarette  use.  Denies illicit drug use.   Test / Admission - Considered:  Atypical respiratory infection Vitals signs within normal range and stable throughout visit. Laboratory/imaging studies significant for: See above Patient coming in with complaints of tracheostomy issue with tracheostomy patient it was just changed yesterday.  Patient reports cough over the past 2 to 3 days.  Concern for possible pneumonia versus aspiration pneumonia so chest x-ray was performed.  Showed evidence of bibasilar opacities on chest x-ray concerning for atypical infection.  Given tracheostomy placement, patient complaining of cough and feelings of shortness of breath, empiric antibiotic coverage will be given.  Patient describes history of possible aspiration so some concern for developing aspiration pneumonia.  Patient not hypoxic with stable vital signs within normal range so further workup via laboratory studies deemed unnecessary at this time.  Recommend follow-up with primary care in 2 to 3 days for reassessment of symptoms.  Treatment plan discussed at length with patient he acknowledged understanding was agreeable to said plan. Worrisome signs and symptoms were discussed with the patient, and the patient acknowledged understanding to return to the ED if noticed. Patient was stable upon discharge.          Final Clinical Impression(s) / ED Diagnoses Final diagnoses:  Atypical pneumonia    Rx / DC Orders ED Discharge Orders          Ordered    amoxicillin-clavulanate (AUGMENTIN) 875-125 MG tablet  Every 12 hours        03/17/22 1313    azithromycin (ZITHROMAX) 250 MG tablet  Daily        03/17/22 1313              Wilnette Kales, [place] 03/17/22 1447    Noemi Chapel, MD 03/18/22 [phone]

## 2022-03-17 NOTE — ED Triage Notes (Signed)
Pt reports SOB since last night. Pt has trach, O2 98% after ambulating to triage. Endorses throat pain. Pt seen at [facility] today for same and sent home with abx for possible PNA.

## 2022-03-17 NOTE — Progress Notes (Signed)
RT called to assess pt. Gave pt a new inner cannula. No signs of any distress at this time. Vitals stable on Room Air at 98%

## 2022-03-17 NOTE — ED Notes (Signed)
Pt's family stated that he could not wait ay longer and "he is dying". Pt's family stated they are going to another hospital. Pt seen leaving the ED with family.

## 2022-03-17 NOTE — ED Notes (Signed)
Fluids provided for pt. Pt trach suctioned

## 2022-03-17 NOTE — Discharge Instructions (Addendum)
Your workup today was overall consistent with atypical infection and pneumonia.  Will treat this antibiotics as discussed.  Recommend follow-up with primary care for reassessment of your symptoms.  Please not hesitate to return to emergency room if the worrisome signs and symptoms we discussed become apparent.

## 2022-03-17 NOTE — ED Triage Notes (Addendum)
Pt with trach, clogged since this morning per pt. Pt able to ambulate back to room. States has been around cigarette smoke recently.

## 2022-03-17 NOTE — Telephone Encounter (Signed)
Referral entered  

## 2022-03-17 NOTE — ED Provider Triage Note (Addendum)
Emergency Medicine Provider Triage Evaluation Note  Shawn Ramsey , a 42 y.o. male  was evaluated in triage.  Pt complains of sore throat and shortness of breath.  Patient has a trach on and has increased shortness of breath for the last few days.  History of asthma and COPD.  Denies fever, chest pain, abdominal pain, nausea, vomiting, urinary symptoms or bowel changes.  Review of Systems  Positive: As above Negative: As above  Physical Exam  BP (!) 152/100 (BP Location: Right Wrist)   Pulse 89   Temp 99.1 F (37.3 C) (Oral)   Resp 20   Ht '5\' 5"'$  (1.651 m)   Wt 114 kg   SpO2 99%   BMI 41.82 kg/m  Gen:   Awake, no distress   Resp:  Normal effort  MSK:   Moves extremities without difficulty  Other:  Wheezing present.  Medical Decision Making  Medically screening exam initiated at 3:15 PM.  Appropriate orders placed.  Shawn Ramsey was informed that the remainder of the evaluation will be completed by another provider, this initial triage assessment does not replace that evaluation, and the importance of remaining in the ED until their evaluation is complete.    Rex Kras, [place] 03/17/22 1518    Rex Kras, [place] 03/17/22 [phone]

## 2022-03-17 NOTE — ED Triage Notes (Signed)
C/o SOB, pt has trach in place. No respiratory distress noted. Pt states he was diagnosed with PNA today, but abx were not at pharmacy when he went to pick them up. Also states his nebulizer is messed up at home so he is unable to perform breathing tx

## 2022-03-17 NOTE — Progress Notes (Signed)
RT called to assess patient due to possible clogged trach.  Patient just had trach changed yesterday.  RT able to pass suction catheter with no issues and minimal to no secretions noted.  Positive CO2 color change and equal BBS breath sounds.  RN made aware of assessment.

## 2022-03-18 ENCOUNTER — Telehealth: Payer: Self-pay

## 2022-03-18 NOTE — Telephone Encounter (Signed)
Transition Care Management Follow-up Telephone Call Date of discharge and from where: 03/17/22 [facility] How have you been since you were released from the hospital? Doing better;no more SOB;picked up medications/ABT ordered and taking Any questions or concerns? No  Items Reviewed: Did the pt receive and understand the discharge instructions provided? Yes  Medications obtained and verified? Yes  Other? No  Any new allergies since your discharge? No  Dietary orders reviewed? No Do you have support at home? Yes  lives with mother   Functional Questionnaire: (I = Independent and D = Dependent) ADLs: i  Bathing/Dressing- i  Meal Prep- i  Eating- i  Maintaining continence- i  Transferring/Ambulation- i  Managing Meds- i  Follow up appointments reviewed:  PCP Hospital f/u appt confirmed? Yes  Scheduled to see Dr [name] on 04/13/22 @ 4:20pm. [facility] f/u appt confirmed? No  Are transportation arrangements needed? No  If their condition worsens, is the pt aware to call PCP or go to the Emergency Dept.? Yes Was the patient provided with contact information for the PCP's office or ED? Yes Was to pt encouraged to call back with questions or concerns? Yes

## 2022-03-23 ENCOUNTER — Encounter: Payer: Self-pay | Admitting: Family Medicine

## 2022-03-23 ENCOUNTER — Ambulatory Visit (INDEPENDENT_AMBULATORY_CARE_PROVIDER_SITE_OTHER): Payer: Medicaid Other | Admitting: Family Medicine

## 2022-03-23 VITALS — BP 106/76 | HR 83 | Ht 65.0 in | Wt 275.1 lb

## 2022-03-23 DIAGNOSIS — E669 Obesity, unspecified: Secondary | ICD-10-CM

## 2022-03-23 DIAGNOSIS — J45991 Cough variant asthma: Secondary | ICD-10-CM | POA: Diagnosis not present

## 2022-03-23 DIAGNOSIS — E785 Hyperlipidemia, unspecified: Secondary | ICD-10-CM

## 2022-03-23 DIAGNOSIS — Z09 Encounter for follow-up examination after completed treatment for conditions other than malignant neoplasm: Secondary | ICD-10-CM | POA: Diagnosis not present

## 2022-03-23 DIAGNOSIS — I10 Essential (primary) hypertension: Secondary | ICD-10-CM

## 2022-03-23 DIAGNOSIS — E1169 Type 2 diabetes mellitus with other specified complication: Secondary | ICD-10-CM

## 2022-03-23 MED ORDER — POTASSIUM CHLORIDE CRYS ER 20 MEQ PO TBCR: 20.0000 meq | EXTENDED_RELEASE_TABLET | Freq: Two times a day (BID) | ORAL | 4 refills | Status: DC

## 2022-03-23 NOTE — Patient Instructions (Addendum)
F/U  as before, call if you need me sooner  Vital that you commit to two potassium tablets daily  Letter will be prepared and faxed today re safe living environment  Chem 7 today  Fasting  lipid, hepatic, HBa1C 3 to  5 days before next appt  Nurse pls cancel referral to new Psychiatrist per pt request  Thanks for choosing [facility], we consider it a privelige to serve you.

## 2022-03-24 LAB — BMP8+EGFR
BUN/Creatinine Ratio: 23 — ABNORMAL HIGH (ref 9–20)
BUN: 18 mg/dL (ref 6–24)
CO2: 25 mmol/L (ref 20–29)
Calcium: 8.8 mg/dL (ref 8.7–10.2)
Chloride: 103 mmol/L (ref 96–106)
Creatinine, Ser: 0.79 mg/dL (ref 0.76–1.27)
Glucose: 84 mg/dL (ref 70–99)
Potassium: 3.9 mmol/L (ref 3.5–5.2)
Sodium: 144 mmol/L (ref 134–144)
eGFR: 114 mL/min/{1.73_m2} (ref >59)

## 2022-03-27 ENCOUNTER — Encounter: Payer: Self-pay | Admitting: Family Medicine

## 2022-03-27 DIAGNOSIS — Z09 Encounter for follow-up examination after completed treatment for conditions other than malignant neoplasm: Secondary | ICD-10-CM | POA: Insufficient documentation

## 2022-03-27 NOTE — Assessment & Plan Note (Signed)
Recent flare , treated in the ED , currently on high dose steroids, needs letter recommending removal from current living conditions

## 2022-03-27 NOTE — Assessment & Plan Note (Signed)
Controlled, no change in medication DASH diet and commitment to daily physical activity for a minimum of 30 minutes discussed and encouraged, as a part of hypertension management. The importance of attaining a healthy weight is also discussed.     03/23/2022    1:38 PM 03/17/2022    9:45 PM 03/17/2022    9:30 PM 03/17/2022    9:15 PM 03/17/2022    8:45 PM 03/17/2022    8:43 PM 03/17/2022    8:42 PM  BP/Weight  Systolic BP 962 229 97 798 921 194 174  Diastolic BP 76 60 73 90 91 85 85  Wt. (Lbs) 275.12      251.32  BMI 45.78 kg/m2      41.82 kg/m2

## 2022-03-27 NOTE — Assessment & Plan Note (Signed)
Patient in for follow up of recent ED visits. Discharge summary, and laboratory and radiology data are reviewed, and any questions or concerns  are discussed. Specific issues requiring follow up are specifically addressed.

## 2022-03-27 NOTE — Assessment & Plan Note (Signed)
Hyperlipidemia:Low fat diet discussed and encouraged.   Lipid Panel  Lab Results  Component Value Date   CHOL 131 10/04/2021   HDL 35 (L) 10/04/2021   LDLCALC 79 10/04/2021   TRIG 91 10/04/2021   CHOLHDL 3.7 10/04/2021

## 2022-03-27 NOTE — Progress Notes (Signed)
Shawn Ramsey     MRN: [id number]      DOB: [birth year]   HPI Shawn Ramsey is here for follow up of recent Ed visit on 03/16/2022 and 03/17/2022 when he was diagnosed with atypical pneumonia and discharged on steroid course Requests a letter be provided to present to his landlord that he vacate current residence which has a lot of mold, mother has photos to show, he dos have chronic lung disease and a mold infested home environment is unhealthy for anyone and in particular for him. He uses a tracheostomy for breathing ROS Denies recent fever or chills. Denies sinus pressure, nasal congestion, ear pain or sore throat. C/o chest congestion and wheezing Denies chest pains, palpitations and leg swelling Denies abdominal pain, nausea, vomiting,diarrhea or constipation.   Denies dysuria, frequency, hesitancy or incontinence. Chronic joint pain, swelling and limitation in mobility. Denies headaches, seizures, numbness, or tingling. Denies uncontrolled depression, anxiety or insomnia.and has decided to stay with current Psychiatrist Denies skin break down or rash.   PE  BP 106/76 (BP Location: Right Arm, Patient Position: Sitting, Cuff Size: Large)   Pulse 83   Ht '5\' 5"'$  (1.651 m)   Wt 275 lb 1.9 oz (124.8 kg)   SpO2 95%   BMI 45.78 kg/m   Patient alert and oriented and in no cardiopulmonary distress.  HEENT: No facial asymmetry, EOMI,     Neck supple .  Chest: Clear to auscultation bilaterally.  CVS: S1, S2 no murmurs, no S3.Regular rate.  ABD: Soft non tender.   Ext: No edema  MS: Adequate ROM spine, shoulders, hips and knees.  Skin: Intact, no ulcerations or rash noted.  Psych: Good eye contact, normal affect. Memory intact not anxious or depressed appearing.  CNS: CN 2-12 intact, power,  normal throughout.no focal deficits noted.   Assessment & Plan  Cough variant asthma Recent flare , treated in the ED , currently on high dose steroids, needs letter recommending  removal from current living conditions  Essential hypertension Controlled, no change in medication DASH diet and commitment to daily physical activity for a minimum of 30 minutes discussed and encouraged, as a part of hypertension management. The importance of attaining a healthy weight is also discussed.     03/23/2022    1:38 PM 03/17/2022    9:45 PM 03/17/2022    9:30 PM 03/17/2022    9:15 PM 03/17/2022    8:45 PM 03/17/2022    8:43 PM 03/17/2022    8:42 PM  BP/Weight  Systolic BP 086 761 97 950 932 671 245  Diastolic BP 76 60 73 90 91 85 85  Wt. (Lbs) 275.12      251.32  BMI 45.78 kg/m2      41.82 kg/m2       Diabetes mellitus type 2 in obese (HCC) Controlled, no change in medication DASH diet and commitment to daily physical activity for a minimum of 30 minutes discussed and encouraged, as a part of hypertension management. The importance of attaining a healthy weight is also discussed.     03/23/2022    1:38 PM 03/17/2022    9:45 PM 03/17/2022    9:30 PM 03/17/2022    9:15 PM 03/17/2022    8:45 PM 03/17/2022    8:43 PM 03/17/2022    8:42 PM  BP/Weight  Systolic BP 809 983 97 382 505 397 673  Diastolic BP 76 60 73 90 91 85 85  Wt. (Lbs) 275.12  251.32  BMI 45.78 kg/m2      41.82 kg/m2       Hyperlipidemia associated with type 2 diabetes mellitus ([place]) Hyperlipidemia:Low fat diet discussed and encouraged.   Lipid Panel  Lab Results  Component Value Date   CHOL 131 10/04/2021   HDL 35 (L) 10/04/2021   LDLCALC 79 10/04/2021   TRIG 91 10/04/2021   CHOLHDL 3.7 10/04/2021       Morbid obesity due to excess calories [facility])  Patient re-educated about  the importance of commitment to a  minimum of 150 minutes of exercise per week as able.  The importance of healthy food choices with portion control discussed, as well as eating regularly and within a 12 hour window most days. The need to choose "clean , green" food 50 to 75% of the time is discussed, as well as  to make water the primary drink and set a goal of 64 ounces water daily.       03/23/2022    1:38 PM 03/17/2022    8:42 PM 03/17/2022    2:48 PM  Weight /BMI  Weight 275 lb 1.9 oz 251 lb 5.2 oz 251 lb 5.2 oz  Height '5\' 5"'$  (1.651 m) '5\' 5"'$  (1.651 m) '5\' 5"'$  (1.651 m)  BMI 45.78 kg/m2 41.82 kg/m2 41.82 kg/m2      Dyslipidemia Hyperlipidemia:Low fat diet discussed and encouraged.   Lipid Panel  Lab Results  Component Value Date   CHOL 131 10/04/2021   HDL 35 (L) 10/04/2021   LDLCALC 79 10/04/2021   TRIG 91 10/04/2021   CHOLHDL 3.7 10/04/2021     Updated lab needed at/ before next visit.   Encounter for examination following treatment at hospital Patient in for follow up of recent ED visits. Discharge summary, and laboratory and radiology data are reviewed, and any questions or concerns  are discussed. Specific issues requiring follow up are specifically addressed.

## 2022-03-27 NOTE — Assessment & Plan Note (Signed)
Hyperlipidemia:Low fat diet discussed and encouraged.   Lipid Panel  Lab Results  Component Value Date   CHOL 131 10/04/2021   HDL 35 (L) 10/04/2021   LDLCALC 79 10/04/2021   TRIG 91 10/04/2021   CHOLHDL 3.7 10/04/2021     Updated lab needed at/ before next visit.

## 2022-03-27 NOTE — Assessment & Plan Note (Signed)
  Patient re-educated about  the importance of commitment to a  minimum of 150 minutes of exercise per week as able.  The importance of healthy food choices with portion control discussed, as well as eating regularly and within a 12 hour window most days. The need to choose "clean , green" food 50 to 75% of the time is discussed, as well as to make water the primary drink and set a goal of 64 ounces water daily.       03/23/2022    1:38 PM 03/17/2022    8:42 PM 03/17/2022    2:48 PM  Weight /BMI  Weight 275 lb 1.9 oz 251 lb 5.2 oz 251 lb 5.2 oz  Height '5\' 5"'$  (1.651 m) '5\' 5"'$  (1.651 m) '5\' 5"'$  (1.651 m)  BMI 45.78 kg/m2 41.82 kg/m2 41.82 kg/m2

## 2022-03-27 NOTE — Assessment & Plan Note (Signed)
Controlled, no change in medication DASH diet and commitment to daily physical activity for a minimum of 30 minutes discussed and encouraged, as a part of hypertension management. The importance of attaining a healthy weight is also discussed.     03/23/2022    1:38 PM 03/17/2022    9:45 PM 03/17/2022    9:30 PM 03/17/2022    9:15 PM 03/17/2022    8:45 PM 03/17/2022    8:43 PM 03/17/2022    8:42 PM  BP/Weight  Systolic BP 833 582 97 518 984 210 312  Diastolic BP 76 60 73 90 91 85 85  Wt. (Lbs) 275.12      251.32  BMI 45.78 kg/m2      41.82 kg/m2

## 2022-04-11 ENCOUNTER — Telehealth: Payer: Self-pay

## 2022-04-11 NOTE — Telephone Encounter (Signed)
Transition Care Management Unsuccessful Follow-up Telephone Call  Date of discharge and from where:  [facility] 04/10/22  Attempts:  1st Attempt  Reason for unsuccessful TCM follow-up call:  No answer/busy

## 2022-04-13 ENCOUNTER — Ambulatory Visit (INDEPENDENT_AMBULATORY_CARE_PROVIDER_SITE_OTHER): Payer: Medicaid Other | Admitting: Family Medicine

## 2022-04-13 ENCOUNTER — Encounter: Payer: Self-pay | Admitting: Family Medicine

## 2022-04-13 VITALS — BP 107/70 | HR 88 | Resp 16 | Ht 65.0 in | Wt 268.0 lb

## 2022-04-13 DIAGNOSIS — J45991 Cough variant asthma: Secondary | ICD-10-CM

## 2022-04-13 DIAGNOSIS — Z09 Encounter for follow-up examination after completed treatment for conditions other than malignant neoplasm: Secondary | ICD-10-CM

## 2022-04-13 DIAGNOSIS — E669 Obesity, unspecified: Secondary | ICD-10-CM

## 2022-04-13 DIAGNOSIS — L987 Excessive and redundant skin and subcutaneous tissue: Secondary | ICD-10-CM

## 2022-04-13 DIAGNOSIS — E1169 Type 2 diabetes mellitus with other specified complication: Secondary | ICD-10-CM

## 2022-04-13 DIAGNOSIS — E785 Hyperlipidemia, unspecified: Secondary | ICD-10-CM

## 2022-04-13 DIAGNOSIS — F321 Major depressive disorder, single episode, moderate: Secondary | ICD-10-CM

## 2022-04-13 LAB — HEPATIC FUNCTION PANEL
ALT: 12 IU/L (ref 0–44)
AST: 11 IU/L (ref 0–40)
Albumin: 4.7 g/dL (ref 4.1–5.1)
Alkaline Phosphatase: 78 IU/L (ref 44–121)
Bilirubin Total: 0.4 mg/dL (ref 0.0–1.2)
Bilirubin, Direct: 0.11 mg/dL (ref 0.00–0.40)
Total Protein: 7.4 g/dL (ref 6.0–8.5)

## 2022-04-13 LAB — LIPID PANEL
Chol/HDL Ratio: 3.1 ratio (ref 0.0–5.0)
Cholesterol, Total: 125 mg/dL (ref 100–199)
HDL: 40 mg/dL (ref >39)
LDL Chol Calc (NIH): 70 mg/dL (ref 0–99)
Triglycerides: 72 mg/dL (ref 0–149)
VLDL Cholesterol Cal: 15 mg/dL (ref 5–40)

## 2022-04-13 LAB — HEMOGLOBIN A1C
Est. average glucose Bld gHb Est-mCnc: 120 mg/dL
Hgb A1c MFr Bld: 5.8 % — ABNORMAL HIGH (ref 4.8–5.6)

## 2022-04-13 MED ORDER — TIRZEPATIDE 10 MG/0.5ML ~~LOC~~ SOAJ: 10.0000 mg | SUBCUTANEOUS | 2 refills | Status: DC

## 2022-04-13 NOTE — Patient Instructions (Signed)
F?U in 10 weeks, call if you need me sooner  Increased dose of mounjaro when next due is 10 mg   Need to change food choice to improve blood sugar and lose weight  I will refer you to [facility] for ssessment for surgery to remove excess skin  Blood sugar has increased so NEED to change food choice

## 2022-04-17 ENCOUNTER — Encounter: Payer: Self-pay | Admitting: Family Medicine

## 2022-04-17 NOTE — Assessment & Plan Note (Signed)
Controlled no med change, followed by Pulmonary

## 2022-04-17 NOTE — Assessment & Plan Note (Signed)
Shawn Ramsey is reminded of the importance of commitment to daily physical activity for 30 minutes or more, as able and the need to limit carbohydrate intake to 30 to 60 grams per meal to help with blood sugar control.   The need to take medication as prescribed, test blood sugar as directed, and to call between visits if there is a concern that blood sugar is uncontrolled is also discussed.   Shawn Ramsey is reminded of the importance of daily foot exam, annual eye examination, and good blood sugar, blood pressure and cholesterol control.     Latest Ref Rng & Units 04/12/2022    3:36 PM 03/23/2022    2:34 PM 03/17/2022    9:06 PM 03/17/2022    3:15 PM 10/04/2021    2:18 PM  Diabetic Labs  HbA1c 4.8 - 5.6 % 5.8     5.5   Micro/Creat Ratio 0 - 29 mg/g creat     3   Chol 100 - 199 mg/dL 125     131   HDL >39 mg/dL 40     35   Calc LDL 0 - 99 mg/dL 70     79   Triglycerides 0 - 149 mg/dL 72     91   Creatinine 0.76 - 1.27 mg/dL  0.79  0.76  0.85        04/13/2022    4:22 PM 03/23/2022    1:38 PM 03/17/2022    9:45 PM 03/17/2022    9:30 PM 03/17/2022    9:15 PM 03/17/2022    8:45 PM 03/17/2022    8:43 PM  BP/Weight  Systolic BP XX123456 A999333 123456 97 Q000111Q Q000111Q 123456  Diastolic BP 70 76 60 73 90 91 85  Wt. (Lbs) 268 275.12       BMI 44.6 kg/m2 45.78 kg/m2           04/20/2021   10:20 AM 01/09/2018    4:00 PM  Foot/eye exam completion dates  Foot Form Completion Done Done      Slight deterioration , inc mounjaro dose

## 2022-04-17 NOTE — Progress Notes (Signed)
Shawn Ramsey     MRN: [id number]      DOB: [birth year]   HPI Shawn Ramsey is here for follow up and re-evaluation of chronic medical conditions, medication management and review of any available recent lab and radiology data.  Preventive health is updated, specifically  Cancer screening and Immunization.   Was in the ED on 04/10/2022 for STD screening, safe sex behavior with condom use is again discussed neg Gc and chlamydia result Continues to work on weight  loss and states he has been told that he is at a weight wear the trach may be removed Requests consultation with Plastics for removal of excess skin due to an over 100 pound weight loss, 380 l b  in 12/202 , current is 110 lb less The PT denies any adverse reactions to current medications since the last visit.  Requests dose inc in mounjaro as weight is at a standstill, and justifiably also his HBA1C has also increased slightly, needs to reduce carbs a in diet   ROS Denies recent fever or chills. Denies sinus pressure, nasal congestion, ear pain or sore throat. Denies chest congestion, productive cough or wheezing. Denies chest pains, palpitations and leg swelling Denies abdominal pain, nausea, vomiting,diarrhea or constipation.   Denies dysuria, frequency, hesitancy or incontinence. Chronic  limitation in mobility. Denies headaches, seizures, numbness, or tingling. Denies uncontrolled depression, anxiety or insomnia. Denies skin break down or rash.   PE  BP 107/70   Pulse 88   Resp 16   Ht '5\' 5"'$  (1.651 m)   Wt 268 lb (121.6 kg)   SpO2 93%   BMI 44.60 kg/m   Patient alert and oriented and in no cardiopulmonary distress.  HEENT: No facial asymmetry, EOMI,     Neck supple .  Chest: decrease air entry , scattered crackles and wheezes CVS: S1, S2 no murmurs, no S3.Regular rate.  ABD: Soft non tender.   Ext: No edema  MS: decreased  ROM spine, shoulders, hips and knees.  Skin: Intact, no ulcerations or rash  noted.  Psych: Good eye contact, normal affect. not anxious or depressed appearing.  CNS: CN 2-12 intact, power,  normal throughout.no focal deficits noted.   Assessment & Plan  Cough variant asthma Controlled no med change, followed by Pulmonary  Hyperlipidemia associated with type 2 diabetes mellitus ([place]) Hyperlipidemia:Low fat diet discussed and encouraged.   Lipid Panel  Lab Results  Component Value Date   CHOL 125 04/12/2022   HDL 40 04/12/2022   LDLCALC 70 04/12/2022   TRIG 72 04/12/2022   CHOLHDL 3.1 04/12/2022     Excellent , no med change  Diabetes mellitus type 2 in obese [facility]) Shawn Ramsey is reminded of the importance of commitment to daily physical activity for 30 minutes or more, as able and the need to limit carbohydrate intake to 30 to 60 grams per meal to help with blood sugar control.   The need to take medication as prescribed, test blood sugar as directed, and to call between visits if there is a concern that blood sugar is uncontrolled is also discussed.   Shawn Ramsey is reminded of the importance of daily foot exam, annual eye examination, and good blood sugar, blood pressure and cholesterol control.     Latest Ref Rng & Units 04/12/2022    3:36 PM 03/23/2022    2:34 PM 03/17/2022    9:06 PM 03/17/2022    3:15 PM 10/04/2021    2:18 PM  Diabetic  Labs  HbA1c 4.8 - 5.6 % 5.8     5.5   Micro/Creat Ratio 0 - 29 mg/g creat     3   Chol 100 - 199 mg/dL 125     131   HDL >39 mg/dL 40     35   Calc LDL 0 - 99 mg/dL 70     79   Triglycerides 0 - 149 mg/dL 72     91   Creatinine 0.76 - 1.27 mg/dL  0.79  0.76  0.85        04/13/2022    4:22 PM 03/23/2022    1:38 PM 03/17/2022    9:45 PM 03/17/2022    9:30 PM 03/17/2022    9:15 PM 03/17/2022    8:45 PM 03/17/2022    8:43 PM  BP/Weight  Systolic BP XX123456 A999333 123456 97 Q000111Q Q000111Q 123456  Diastolic BP 70 76 60 73 90 91 85  Wt. (Lbs) 268 275.12       BMI 44.6 kg/m2 45.78 kg/m2           04/20/2021   10:20 AM 01/09/2018     4:00 PM  Foot/eye exam completion dates  Foot Form Completion Done Done      Slight deterioration , inc mounjaro dose  Morbid obesity due to excess calories [facility])  Patient re-educated about  the importance of commitment to a  minimum of 150 minutes of exercise per week as able.  The importance of healthy food choices with portion control discussed, as well as eating regularly and within a 12 hour window most days. The need to choose "clean , green" food 50 to 75% of the time is discussed, as well as to make water the primary drink and set a goal of 64 ounces water daily.       04/13/2022    4:22 PM 03/23/2022    1:38 PM 03/17/2022    8:42 PM  Weight /BMI  Weight 268 lb 275 lb 1.9 oz 251 lb 5.2 oz  Height '5\' 5"'$  (1.651 m) '5\' 5"'$  (1.651 m) '5\' 5"'$  (1.651 m)  BMI 44.6 kg/m2 45.78 kg/m2 41.82 kg/m2    Inc mounjaro dose when next due  Encounter for examination following treatment at hospital Patient in for follow up of recent ED visit. Discharge summary, and laboratory and radiology data are reviewed, and any questions or concerns  are discussed. Specific issues requiring follow up are specifically addressed.   Depression, major, single episode, moderate (HCC) Stable and controlled, managed by Psych  Excess skin C/o excess skin following 110 pound weight loss, Plastic consultation for intervention

## 2022-04-17 NOTE — Assessment & Plan Note (Signed)
Hyperlipidemia:Low fat diet discussed and encouraged.   Lipid Panel  Lab Results  Component Value Date   CHOL 125 04/12/2022   HDL 40 04/12/2022   LDLCALC 70 04/12/2022   TRIG 72 04/12/2022   CHOLHDL 3.1 04/12/2022     Excellent , no med change

## 2022-04-17 NOTE — Assessment & Plan Note (Signed)
  Patient re-educated about  the importance of commitment to a  minimum of 150 minutes of exercise per week as able.  The importance of healthy food choices with portion control discussed, as well as eating regularly and within a 12 hour window most days. The need to choose "clean , green" food 50 to 75% of the time is discussed, as well as to make water the primary drink and set a goal of 64 ounces water daily.       04/13/2022    4:22 PM 03/23/2022    1:38 PM 03/17/2022    8:42 PM  Weight /BMI  Weight 268 lb 275 lb 1.9 oz 251 lb 5.2 oz  Height 5' 5"$  (1.651 m) 5' 5"$  (1.651 m) 5' 5"$  (1.651 m)  BMI 44.6 kg/m2 45.78 kg/m2 41.82 kg/m2    Inc mounjaro dose when next due

## 2022-04-18 DIAGNOSIS — L987 Excessive and redundant skin and subcutaneous tissue: Secondary | ICD-10-CM | POA: Insufficient documentation

## 2022-04-18 NOTE — Assessment & Plan Note (Signed)
Stable and controlled, managed by Psych

## 2022-04-18 NOTE — Assessment & Plan Note (Signed)
Patient in for follow up of recent ED visit. Discharge summary, and laboratory and radiology data are reviewed, and any questions or concerns  are discussed. Specific issues requiring follow up are specifically addressed.

## 2022-04-18 NOTE — Assessment & Plan Note (Signed)
C/o excess skin following 110 pound weight loss, Plastic consultation for intervention

## 2022-05-04 ENCOUNTER — Ambulatory Visit (INDEPENDENT_AMBULATORY_CARE_PROVIDER_SITE_OTHER): Payer: Medicaid Other | Admitting: Family Medicine

## 2022-05-04 ENCOUNTER — Encounter: Payer: Self-pay | Admitting: Family Medicine

## 2022-05-04 VITALS — BP 116/81 | HR 87 | Ht 65.0 in | Wt 268.1 lb

## 2022-05-04 DIAGNOSIS — E1169 Type 2 diabetes mellitus with other specified complication: Secondary | ICD-10-CM

## 2022-05-04 DIAGNOSIS — F321 Major depressive disorder, single episode, moderate: Secondary | ICD-10-CM | POA: Diagnosis not present

## 2022-05-04 DIAGNOSIS — E669 Obesity, unspecified: Secondary | ICD-10-CM

## 2022-05-04 DIAGNOSIS — E785 Hyperlipidemia, unspecified: Secondary | ICD-10-CM

## 2022-05-04 DIAGNOSIS — M1732 Unilateral post-traumatic osteoarthritis, left knee: Secondary | ICD-10-CM

## 2022-05-04 MED ORDER — DICLOFENAC SODIUM 3 % EX GEL: CUTANEOUS | 1 refills | Status: DC

## 2022-05-04 NOTE — Patient Instructions (Addendum)
F/u as before, call iof you need me sooner, foot exam at  visit  & 7  pound weight loss since January,iam looking for another 7 pounds  Use gel to left knee when needed for pain  Keep referral appts  Thanks for choosing [facility], we consider it a privelige to serve you.
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
## 2022-05-09 ENCOUNTER — Encounter: Payer: Self-pay | Admitting: Family Medicine

## 2022-05-09 DIAGNOSIS — M1712 Unilateral primary osteoarthritis, left knee: Secondary | ICD-10-CM | POA: Insufficient documentation

## 2022-05-09 NOTE — Assessment & Plan Note (Signed)
Controlled, no change in medication  

## 2022-05-09 NOTE — Assessment & Plan Note (Signed)
  Patient re-educated about  the importance of commitment to a  minimum of 150 minutes of exercise per week as able.  The importance of healthy food choices with portion control discussed, as well as eating regularly and within a 12 hour window most days. The need to choose "clean , green" food 50 to 75% of the time is discussed, as well as to make water the primary drink and set a goal of 64 ounces water daily.       05/04/2022    2:10 PM 05/04/2022    1:33 PM 04/13/2022    4:22 PM  Weight /BMI  Weight 268 lb 1.3 oz 274 lb 0.6 oz 268 lb  Height  5\' 5"  (1.651 m) 5\' 5"  (1.651 m)  BMI 44.61 kg/m2 45.6 kg/m2 44.6 kg/m2    Increase dose mounjaro

## 2022-05-09 NOTE — Assessment & Plan Note (Signed)
[name] is reminded of the importance of commitment to daily physical activity for 30 minutes or more, as able and the need to limit carbohydrate intake to 30 to 60 grams per meal to help with blood sugar control.   The need to take medication as prescribed, test blood sugar as directed, and to call between visits if there is a concern that blood sugar is uncontrolled is also discussed.   Shawn Ramsey is reminded of the importance of daily foot exam, annual eye examination, and good blood sugar, blood pressure and cholesterol control.     Latest Ref Rng & Units 04/12/2022    3:36 PM 03/23/2022    2:34 PM 03/17/2022    9:06 PM 03/17/2022    3:15 PM 10/04/2021    2:18 PM  Diabetic Labs  HbA1c 4.8 - 5.6 % 5.8     5.5   Micro/Creat Ratio 0 - 29 mg/g creat     3   Chol 100 - 199 mg/dL 125     131   HDL >39 mg/dL 40     35   Calc LDL 0 - 99 mg/dL 70     79   Triglycerides 0 - 149 mg/dL 72     91   Creatinine 0.76 - 1.27 mg/dL  0.79  0.76  0.85        05/04/2022    2:10 PM 05/04/2022    1:33 PM 04/13/2022    4:22 PM 03/23/2022    1:38 PM 03/17/2022    9:45 PM 03/17/2022    9:30 PM 03/17/2022    9:15 PM  BP/Weight  Systolic BP  99991111 [id number] 97 [id number]  Diastolic BP  81 70 76 60 73 90  Wt. (Lbs) 268.08 274.04 268 275.12     BMI 44.61 kg/m2 45.6 kg/m2 44.6 kg/m2 45.78 kg/m2         04/20/2021   10:20 AM 01/09/2018    4:00 PM  Foot/eye exam completion dates  Foot Form Completion Done Done

## 2022-05-09 NOTE — Assessment & Plan Note (Signed)
Topical diclofenac twice daily as needed, and avoid trauma

## 2022-05-09 NOTE — Assessment & Plan Note (Signed)
Hyperlipidemia:Low fat diet discussed and encouraged.   Lipid Panel  Lab Results  Component Value Date   CHOL 125 04/12/2022   HDL 40 04/12/2022   LDLCALC 70 04/12/2022   TRIG 72 04/12/2022   CHOLHDL 3.1 04/12/2022     Controlled, no change in medication

## 2022-05-09 NOTE — Progress Notes (Signed)
Shawn Ramsey     MRN: [id number]      DOB: [birth year]   HPI Shawn Ramsey is here for follow up and re-evaluation of chronic medical conditions, medication management and review of any available recent lab and radiology data.  Preventive health is updated, specifically  Cancer screening and Immunization.   Questions or concerns regarding consultations or procedures which the PT has had in the interim are  addressed. The PT denies any adverse reactions to current medications since the last visit.  C/o left knee pain and stiffness has started in the gym and thinks he overdid it ROS Denies recent fever or chills. Denies sinus pressure, nasal congestion, ear pain or sore throat. Denies chest congestion, productive cough or wheezing. Denies chest pains, palpitations and leg swelling Denies abdominal pain, nausea, vomiting,diarrhea or constipation.   Denies dysuria, frequency, hesitancy or incontinence. . Denies headaches, seizures, numbness, or tingling. Denies depression, anxiety or insomnia. Denies skin break down or rash.   PE  BP 116/81 (BP Location: Right Arm, Patient Position: Sitting, Cuff Size: Large)   Pulse 87   Ht 5\' 5"  (1.651 m)   Wt 268 lb 1.3 oz (121.6 kg)   SpO2 95%   BMI 44.61 kg/m   Patient alert and oriented and in no cardiopulmonary distress.  HEENT: No facial asymmetry, EOMI,     Neck supple .  Chest: Clear to auscultation bilaterally.  CVS: S1, S2 no murmurs, no S3.Regular rate.  ABD: Soft non tender.   Ext: No edema  MS: decreased ROM  spine,  and  left knee.  Skin: Intact, no ulcerations or rash noted.  Psych: Good eye contact, normal affect. Memory intact not anxious or depressed appearing.  CNS: CN 2-12 intact, power,  normal throughout.no focal deficits noted.   Assessment & Plan  Osteoarthritis of left knee Topical diclofenac twice daily as needed, and avoid trauma  Diabetes mellitus type 2 in obese Shawn Ramsey [facility]) Shawn Ramsey is reminded of  the importance of commitment to daily physical activity for 30 minutes or more, as able and the need to limit carbohydrate intake to 30 to 60 grams per meal to help with blood sugar control.   The need to take medication as prescribed, test blood sugar as directed, and to call between visits if there is a concern that blood sugar is uncontrolled is also discussed.   Shawn Ramsey is reminded of the importance of daily foot exam, annual eye examination, and good blood sugar, blood pressure and cholesterol control.     Latest Ref Rng & Units 04/12/2022    3:36 PM 03/23/2022    2:34 PM 03/17/2022    9:06 PM 03/17/2022    3:15 PM 10/04/2021    2:18 PM  Diabetic Labs  HbA1c 4.8 - 5.6 % 5.8     5.5   Micro/Creat Ratio 0 - 29 mg/g creat     3   Chol 100 - 199 mg/dL 125     131   HDL >39 mg/dL 40     35   Calc LDL 0 - 99 mg/dL 70     79   Triglycerides 0 - 149 mg/dL 72     91   Creatinine 0.76 - 1.27 mg/dL  0.79  0.76  0.85        05/04/2022    2:10 PM 05/04/2022    1:33 PM 04/13/2022    4:22 PM 03/23/2022    1:38 PM 03/17/2022  9:45 PM 03/17/2022    9:30 PM 03/17/2022    9:15 PM  BP/Weight  Systolic BP  [id number] 97 [id number]  Diastolic BP  81 70 76 60 73 90  Wt. (Lbs) 268.08 274.04 268 275.12     BMI 44.61 kg/m2 45.6 kg/m2 44.6 kg/m2 45.78 kg/m2         04/20/2021   10:20 AM 01/09/2018    4:00 PM  Foot/eye exam completion dates  Foot Form Completion Done Done        Hyperlipidemia associated with type 2 diabetes mellitus ([place]) Hyperlipidemia:Low fat diet discussed and encouraged.   Lipid Panel  Lab Results  Component Value Date   CHOL 125 04/12/2022   HDL 40 04/12/2022   LDLCALC 70 04/12/2022   TRIG 72 04/12/2022   CHOLHDL 3.1 04/12/2022     Controlled, no change in medication   Depression, major, single episode, moderate (HCC) Controlled, no change in medication   Morbid obesity due to excess calories [facility])  Patient re-educated about  the importance of commitment  to a  minimum of 150 minutes of exercise per week as able.  The importance of healthy food choices with portion control discussed, as well as eating regularly and within a 12 hour window most days. The need to choose "clean , green" food 50 to 75% of the time is discussed, as well as to make water the primary drink and set a goal of 64 ounces water daily.       05/04/2022    2:10 PM 05/04/2022    1:33 PM 04/13/2022    4:22 PM  Weight /BMI  Weight 268 lb 1.3 oz 274 lb 0.6 oz 268 lb  Height  5\' 5"  (1.651 m) 5\' 5"  (1.651 m)  BMI 44.61 kg/m2 45.6 kg/m2 44.6 kg/m2    Increase dose mounjaro

## 2022-05-10 ENCOUNTER — Encounter: Payer: Self-pay | Admitting: Psychiatry

## 2022-05-10 ENCOUNTER — Ambulatory Visit (INDEPENDENT_AMBULATORY_CARE_PROVIDER_SITE_OTHER): Payer: No Typology Code available for payment source | Admitting: Psychiatry

## 2022-05-10 VITALS — BP 116/67 | HR 90 | Temp 97.9°F | Ht 65.0 in | Wt 276.8 lb

## 2022-05-10 DIAGNOSIS — F1011 Alcohol abuse, in remission: Secondary | ICD-10-CM

## 2022-05-10 DIAGNOSIS — F3341 Major depressive disorder, recurrent, in partial remission: Secondary | ICD-10-CM

## 2022-05-10 DIAGNOSIS — F79 Unspecified intellectual disabilities: Secondary | ICD-10-CM | POA: Diagnosis not present

## 2022-05-10 MED ORDER — SERTRALINE HCL 100 MG PO TABS: 200.0000 mg | ORAL_TABLET | Freq: Every day | ORAL | 1 refills | Status: DC

## 2022-05-10 MED ORDER — NALTREXONE HCL 50 MG PO TABS: 25.0000 mg | ORAL_TABLET | Freq: Every day | ORAL | 1 refills | Status: AC

## 2022-05-10 NOTE — Patient Instructions (Signed)
Continue sertraline 200 mg at night  Continue naltrexone 25 mg at night  Next appointment: 5/13 at 3 PM, in person

## 2022-05-11 ENCOUNTER — Ambulatory Visit: Payer: Medicaid Other | Admitting: Pulmonary Disease

## 2022-05-18 ENCOUNTER — Telehealth: Payer: Self-pay

## 2022-05-18 NOTE — Telephone Encounter (Signed)
Pt mother notified.

## 2022-05-18 NOTE — Telephone Encounter (Signed)
That medication is ordered by Dr. Chesley Mires. Advise him to contact that provider for refills.

## 2022-05-18 NOTE — Telephone Encounter (Signed)
Pt last seen on 05-10-22 next appt 08-04-22   zolpidem (AMBIEN) 5 MG tablet Medication Date: 01/06/2022 Department: [facility] Ordering/Authorizing: Chesley Mires, MD   Order Providers  Prescribing Provider Encounter Provider  Chesley Mires, MD Chesley Mires, MD   Outpatient Medication Detail   Disp Refills Start End   zolpidem (AMBIEN) 5 MG tablet 30 tablet 2 01/06/2022    Sig - Route: Take 1 tablet (5 mg total) by mouth at bedtime as needed for sleep. - Oral   Sent to pharmacy as: zolpidem (AMBIEN) 5 MG tablet   E-Prescribing Status: Receipt confirmed by pharmacy (01/06/2022 12:09 PM EST)

## 2022-05-20 ENCOUNTER — Other Ambulatory Visit: Payer: Self-pay

## 2022-05-20 ENCOUNTER — Telehealth: Payer: Self-pay | Admitting: Family Medicine

## 2022-05-20 MED ORDER — ATORVASTATIN CALCIUM 40 MG PO TABS: 40.0000 mg | ORAL_TABLET | Freq: Every day | ORAL | 0 refills | Status: DC

## 2022-05-20 NOTE — Telephone Encounter (Signed)
Prescription Request  05/20/2022  LOV: 05/04/2022  What is the name of the medication or equipment? atorvastatin (LIPITOR) 40 MG tablet   Have you contacted your pharmacy to request a refill? Yes   Which pharmacy would you like this sent to?  [facility] [address], [place] [address] Phone: [phone] Fax: [phone]    Patient notified that their request is being sent to the clinical staff for review and that they should receive a response within 2 business days.   Please advise at [place]

## 2022-05-20 NOTE — Telephone Encounter (Signed)
Refills sent to pharmacy. 
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
## 2022-06-01 ENCOUNTER — Institutional Professional Consult (permissible substitution): Payer: Medicaid Other | Admitting: Plastic Surgery

## 2022-06-02 ENCOUNTER — Telehealth: Payer: Self-pay | Admitting: Acute Care

## 2022-06-02 ENCOUNTER — Ambulatory Visit (HOSPITAL_COMMUNITY)
Admission: RE | Admit: 2022-06-02 | Discharge: 2022-06-02 | Disposition: A | Discharge status: Home / Self Care | Payer: Medicaid Other | Source: Ambulatory Visit | Attending: Acute Care | Admitting: Acute Care

## 2022-06-02 DIAGNOSIS — J9610 Chronic respiratory failure, unspecified whether with hypoxia or hypercapnia: Secondary | ICD-10-CM | POA: Diagnosis present

## 2022-06-02 DIAGNOSIS — G4733 Obstructive sleep apnea (adult) (pediatric): Secondary | ICD-10-CM | POA: Diagnosis present

## 2022-06-02 DIAGNOSIS — Z930 Tracheostomy status: Secondary | ICD-10-CM | POA: Diagnosis present

## 2022-06-02 DIAGNOSIS — E662 Morbid (severe) obesity with alveolar hypoventilation: Secondary | ICD-10-CM | POA: Diagnosis present

## 2022-06-02 NOTE — Progress Notes (Signed)
Reason for visit  Trach management/ possible decannulation   HPI Andre is well known to me. I follow him for trach management in the setting of MO w/ associated OHS and OSA. Happy to report he has had significant weight loss and recently had successful PSG and now has home CPAP. We have been working on assessing for decannulation. On last visit in January he was not compliant w/ his CPAP. "When I fall asleep I forget to put it on". Started on [place] for weight loss. I told him once he can demonstrate that he can tolerate the CPAP and use it regularly we could removed the trach. He returns today for trach change.  Continues to do well with weight loss, however still having trouble with compliance in regards to his CPAP.  He says the mask is uncomfortable   Review of Systems  Constitutional: Negative.   HENT: Negative.    Eyes: Negative.   Respiratory: Negative.    Cardiovascular: Negative.   Gastrointestinal: Negative.   Genitourinary: Negative.   Musculoskeletal: Negative.   Skin: Negative.   Endo/Heme/Allergies: Negative.     Exam  General this is a pleasant 42 year old male patient well-known to me I follow him for tracheostomy care HEENT normocephalic atraumatic size 6 tracheostomy is midline, phonation quality is excellent with PMV in place tracheostomy stoma is unremarkable Pulmonary: Diminished bases no accessory use Cardiac: Regular rate and rhythm Abdomen: Soft nontender no organomegaly Extremities: Trace lower extremity edema, good pulses. Neuro: At baseline awake and oriented.   Procedure The 6 trach was removed. Site inspected. Was unremarkable. New trach placed over obturator. Pt tolerated well. Placement confirmed via ETCO2   Impression/plan Active Problems:   OSA (obstructive sleep apnea)   Chronic respiratory failure   Obesity hypoventilation syndrome    OSA (obstructive sleep apnea) Overview: CPAP download shows his average AHI 0.7 with median  CPAP 8 and 95 th percentile CPAP 10 cm H2O.  Plan Cont CPAP at HS (last seen by Dr Craige Cotta 11/16)  Once he can be compliant with this the trach can come out.    Tracheostomy status Overview: Followed at [facility].  Size 6 cuffless proximal XLT shiley Last change 4/11 (previously 1/24)  Discussion Still not doing great w/ compliance re: CPAP ** His trach is stable and he is clear for decannulation IMO once he can prove he will use his CPAP regularly   Plan ROV 8 weeks to assess for decannulation vs routine trach change    Simonne Martinet ACNP-BC [facility] Pager # [phone] OR # [phone] if no answer

## 2022-06-02 NOTE — Progress Notes (Signed)
Tracheostomy Procedure Note  Shawn Ramsey [id number] Mar 29, 1980  Pre Procedure Tracheostomy Information  Trach Brand: Shiley Size:  6.0  6.XLTUP Style: Uncuffed Secured by: Velcro   Procedure: Trach Change and Trach Cleaning    Post Procedure Tracheostomy Information  Trach Brand: Shiley Size:  6.0  6XLTUP Style: Uncuffed Secured by: Velcro   Post Procedure Evaluation:  ETCO2 positive color change from yellow to purple : Yes.   Vital signs: Patients current condition: stable Complications: No apparent complications Trach site exam: clean, dry Wound care done: 4 x 4 gauze drain Patient did tolerate procedure well.   Education: none  Prescription needs: none    Additional needs: New PMV given to patient at this visit.

## 2022-06-05 IMAGING — DX DG CHEST 1V PORT
1 series · 1 of 1 positions shown · non-contrast
Comparison: February 10, 2020

CLINICAL DATA: Shortness of breath.

EXAM:
PORTABLE CHEST 1 VIEW

[chest]
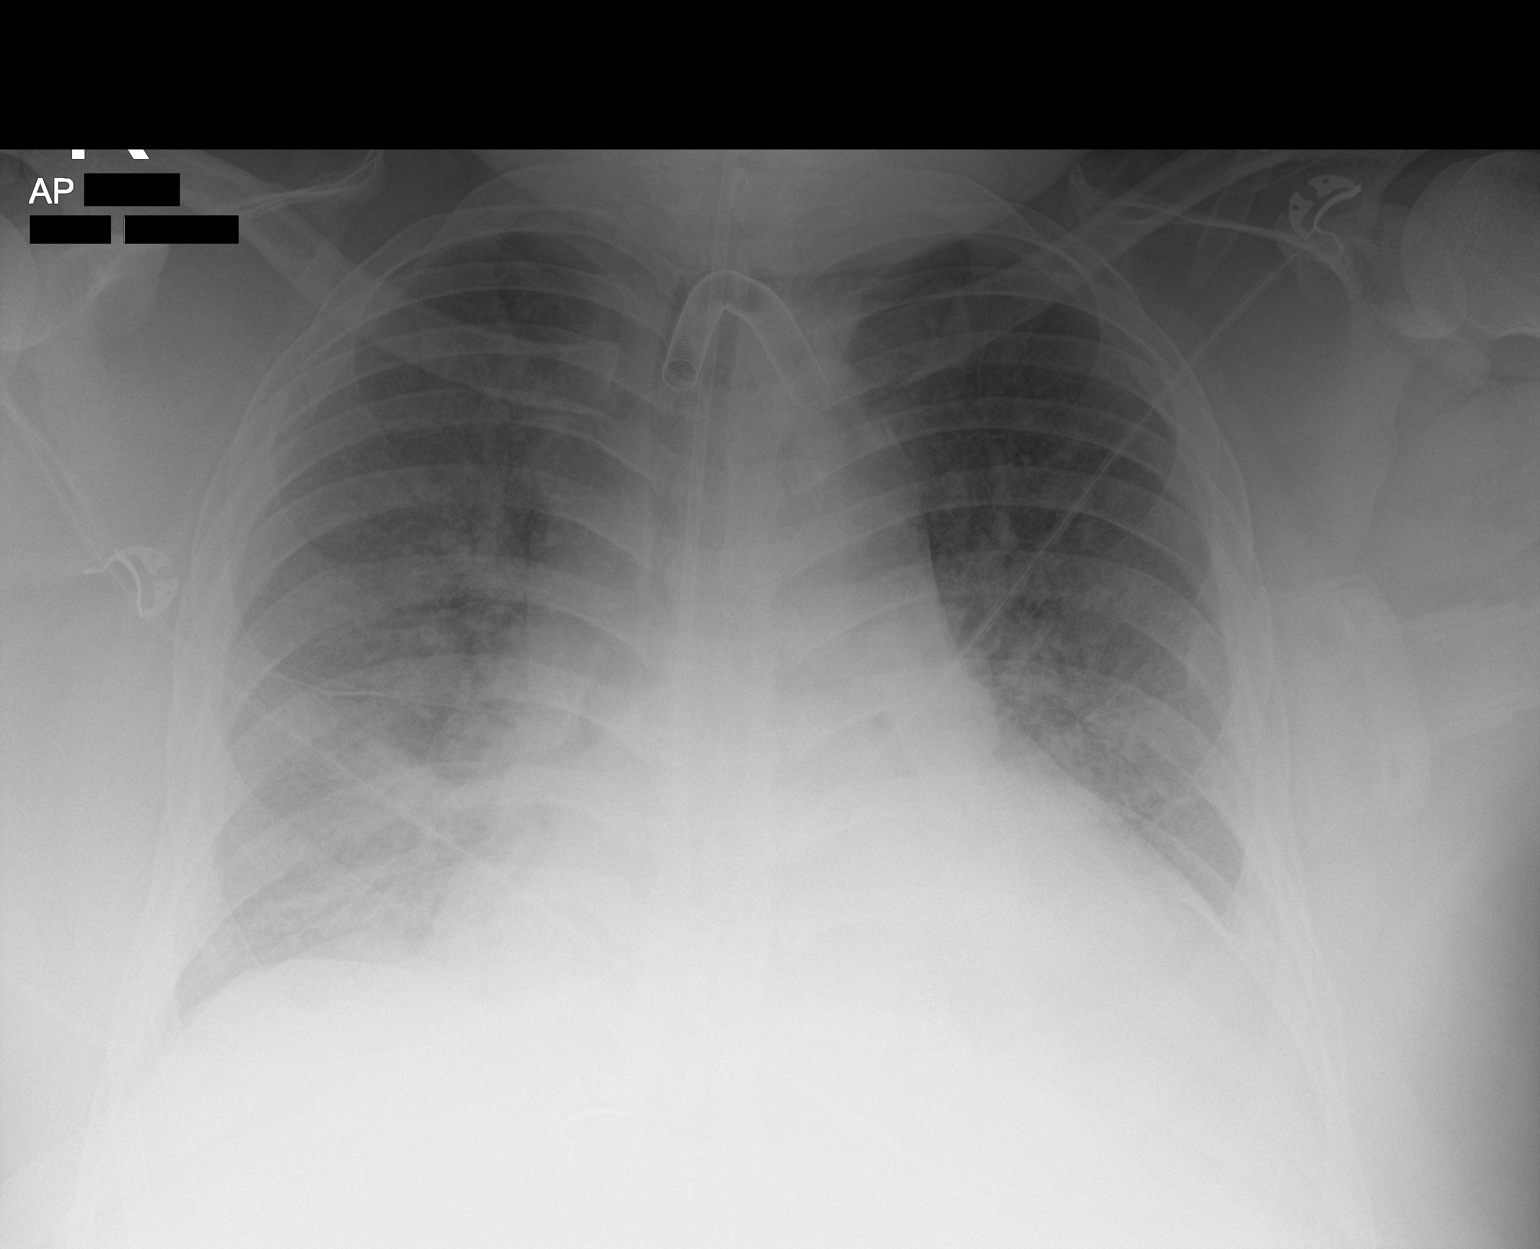

[1 of 1 positions shown; findings below may reference images not displayed]

FINDINGS: The endotracheal tube seen on the prior study has been removed. A
tracheostomy tube is in place. A Dobbhoff feeding tube is seen with
its distal end extending below the level of the diaphragm. Mild,
stable interstitial edema is seen with areas of mild atelectasis
and/or infiltrate noted within the bilateral lung bases. A small
left pleural effusion is suspected. No pneumothorax is identified.
The cardiac silhouette is moderately enlarged and unchanged in size.
The visualized skeletal structures are unremarkable.
IMPRESSION: 1. Stable interstitial edema with mild bibasilar atelectasis and/or
infiltrate.
2. Small left pleural effusion.

## 2022-06-06 ENCOUNTER — Encounter: Payer: Self-pay | Admitting: Internal Medicine

## 2022-06-06 NOTE — Telephone Encounter (Signed)
Order has been faxed to [facility] on 4/11 - confirmation was received. [facility] stated that someone will be reaching out to him shortly.

## 2022-06-21 ENCOUNTER — Other Ambulatory Visit: Payer: Self-pay | Admitting: Family Medicine

## 2022-06-22 ENCOUNTER — Ambulatory Visit: Payer: Medicaid Other | Admitting: Family Medicine

## 2022-07-13 ENCOUNTER — Institutional Professional Consult (permissible substitution): Payer: Medicaid Other | Admitting: Plastic Surgery

## 2022-07-15 ENCOUNTER — Telehealth: Payer: Self-pay | Admitting: Family Medicine

## 2022-07-15 NOTE — Telephone Encounter (Signed)
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

## 2022-07-15 NOTE — Telephone Encounter (Signed)
Sent teams message also, patient mother is asking for a call back ASAP

## 2022-07-26 ENCOUNTER — Other Ambulatory Visit: Payer: Self-pay | Admitting: Internal Medicine

## 2022-07-26 ENCOUNTER — Telehealth: Payer: Self-pay | Admitting: Family Medicine

## 2022-07-26 DIAGNOSIS — E1169 Type 2 diabetes mellitus with other specified complication: Secondary | ICD-10-CM

## 2022-07-26 MED ORDER — TIRZEPATIDE 7.5 MG/0.5ML ~~LOC~~ SOAJ: 7.5000 mg | SUBCUTANEOUS | 0 refills | Status: DC

## 2022-07-26 NOTE — Telephone Encounter (Signed)
Mother called in on patient behalf   [facility] [address] [place] - [address], [place] [address] Phone: [phone]  Fax: [phone]   Is out of tirzepatide [facility]) 10 MG/0.5ML Pen   They have 7.5mg  or 12.5mg  in stock.  Mom Wants a call back pt has been without med for 3weeks.

## 2022-07-26 NOTE — Telephone Encounter (Signed)
Left message for mom to call back 

## 2022-07-28 NOTE — Telephone Encounter (Signed)
Wanda (mom) LVM returning call

## 2022-08-01 ENCOUNTER — Inpatient Hospital Stay (HOSPITAL_COMMUNITY): Admission: RE | Admit: 2022-08-01 | Payer: Medicaid Other | Source: Ambulatory Visit

## 2022-08-04 ENCOUNTER — Ambulatory Visit: Payer: No Typology Code available for payment source | Admitting: Psychiatry

## 2022-08-04 NOTE — Telephone Encounter (Signed)
I SPOKE WITH MOTHER WANDA ON 08/04/2022, NO CURRENT CONCERNS RE Mrsa, STATES HE PUT VASELINE IN HIS NOSE, REVIEW OF ed VISIT ON DATE OF CONCERN IS FOR HEADACHE, no mention OF mrsa, MOTHER REASSURED AND HE IS DOING WELL

## 2022-08-15 ENCOUNTER — Telehealth: Payer: Self-pay | Admitting: Pulmonary Disease

## 2022-08-15 NOTE — Telephone Encounter (Signed)
[employer] calling for Cert of Medical Ness . She has sent fax's. Her name is Shawn Ramsey. Fax # is [phone]   PS- She will get office notes from Epic but I don't think she can get Cert. TY.

## 2022-08-22 ENCOUNTER — Other Ambulatory Visit: Payer: Self-pay | Admitting: Family Medicine

## 2022-08-29 NOTE — Telephone Encounter (Signed)
44 weeks old. Please resolve. Thank you.

## 2022-08-30 ENCOUNTER — Telehealth: Payer: Self-pay

## 2022-08-30 NOTE — Telephone Encounter (Signed)
This is Dr Evlyn Courier so this would have been sent to Lillia Abed to have him sign and fax back they can fax it over to my fax if they need to [phone]

## 2022-08-30 NOTE — Telephone Encounter (Signed)
pt called left a message could not make out what the patient was saying. I got the phone number and called that is how i was able to get patient name.

## 2022-08-30 NOTE — Telephone Encounter (Signed)
PCC's please advise.  

## 2022-08-30 NOTE — Telephone Encounter (Signed)
tried to call patient to see what the problem was and his mother states that he wasn't there and she tell him to call back.  She states that she didn't know why he called

## 2022-08-31 NOTE — Telephone Encounter (Signed)
Mardella Layman faxed this to [facility] yesterday for Dr. Craige Cotta to sign. Dr. Craige Cotta has signed and I have faxed it to [facility] at [phone]

## 2022-09-08 ENCOUNTER — Telehealth: Payer: Self-pay | Admitting: *Deleted

## 2022-09-08 NOTE — Telephone Encounter (Signed)
He should be scheduled for next available follow up with Dr. Vassie Loll.

## 2022-09-08 NOTE — Telephone Encounter (Signed)
I called and spoke with patient's mother [name]), I advised that Dr. Craige Cotta recommended that he see Dr. Vassie Loll in [place] first available.  I let her know that his first available for a new patient slot would be tomorrow at 8:30 am, however, she stated she would not be able to bring him then.  I advised her that the next available would be into the 2nd week of October when the new schedule comes out and I was unsure when that would be.  I advised her to call us back the first of August to see if the schedule is available so we can get him in.  As far as supplies, I advised that she let [facility] know that he is having to change providers and we are working on getting him an appointment, however, it would likely be mid October before we could get him seen.  I asked her to call back if she had any problems getting supplies as he is getting low with his supplies.  he verified understanding.
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
## 2022-09-08 NOTE — Telephone Encounter (Signed)
Dr. Craige Cotta, I spoke with the patient's mother, [name], she would like to know who you recommend her son see in [place] after you leave the practice in September.  Please advise so we can schedule follow up in the [place] office.  Thank you.

## 2022-09-08 NOTE — Telephone Encounter (Signed)
Patient's mother [name] (OK per [facility]) would like to discuss trach supplies and an order for oxygen.   Patient's mother states that [employer] needed an order for trach supplies, but is confused about the need for oxygen. Please call and advise patient. [phone]

## 2022-09-08 NOTE — Telephone Encounter (Signed)
Called [employer] [facility] to find out what they need from our office for patient to continue getting trach supplies.  I was advised that he needed new oxygen testing and documentation as his last documentation is 42 days old (from December 2023) and insurance is requiring follow testing/documentation.  I advised her that he currently does not have a f/u appointment and I am in the process of getting that scheduled.   Message sent to Dr. Craige Cotta to see who he advises patient see in [place] after Dr. Darryll Capers the practice.  Awaiting return message.

## 2022-09-12 NOTE — Telephone Encounter (Signed)
Added patient to wait list for Dr. Vassie Loll per Dr. Craige Cotta for soonest available opening. Closing encounter at this time.

## 2022-09-15 ENCOUNTER — Encounter: Payer: Self-pay | Admitting: Family Medicine

## 2022-09-15 ENCOUNTER — Ambulatory Visit (INDEPENDENT_AMBULATORY_CARE_PROVIDER_SITE_OTHER): Payer: Medicaid Other | Admitting: Family Medicine

## 2022-09-15 ENCOUNTER — Other Ambulatory Visit: Payer: Self-pay | Admitting: Family Medicine

## 2022-09-15 VITALS — BP 118/76 | HR 96 | Ht 65.0 in | Wt 263.0 lb

## 2022-09-15 DIAGNOSIS — J069 Acute upper respiratory infection, unspecified: Secondary | ICD-10-CM | POA: Diagnosis not present

## 2022-09-15 MED ORDER — AZITHROMYCIN 250 MG PO TABS: ORAL_TABLET | ORAL | 0 refills | Status: DC

## 2022-09-15 MED ORDER — PREDNISONE 20 MG PO TABS: 20.0000 mg | ORAL_TABLET | Freq: Two times a day (BID) | ORAL | 0 refills | Status: AC
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
## 2022-09-15 NOTE — Patient Instructions (Addendum)
        Great to see you today.   - Please take medications as prescribed. - Follow up with your primary health provider if any health concerns arises. - If symptoms worsen please contact your primary care provider and/or visit the emergency department.  

## 2022-09-15 NOTE — Assessment & Plan Note (Addendum)
Recent visit to the ED viral test negative for Covid/ RSV Chest xray negative Azithromycin 250 mg x 5 days Prednisone 20 mg x 5 days Discussed symptomatic treatment, rest, increase oral fluid intake. Take OTC tylenol for fever or pain medications. Follow-up for worsening or persistent symptoms. Patient verbalizes understanding regarding plan of care and all questions answered

## 2022-09-20 ENCOUNTER — Other Ambulatory Visit: Payer: Self-pay

## 2022-09-20 DIAGNOSIS — Z79899 Other long term (current) drug therapy: Secondary | ICD-10-CM | POA: Diagnosis not present

## 2022-09-20 DIAGNOSIS — R300 Dysuria: Secondary | ICD-10-CM | POA: Diagnosis present

## 2022-09-20 DIAGNOSIS — I10 Essential (primary) hypertension: Secondary | ICD-10-CM | POA: Insufficient documentation

## 2022-09-20 DIAGNOSIS — Z202 Contact with and (suspected) exposure to infections with a predominantly sexual mode of transmission: Secondary | ICD-10-CM | POA: Insufficient documentation

## 2022-09-20 NOTE — ED Triage Notes (Signed)
Pt states sexual partner tested positive for Chlamydia and would like to be tested.

## 2022-09-21 ENCOUNTER — Emergency Department (HOSPITAL_COMMUNITY)
Admission: EM | Admit: 2022-09-21 | Discharge: 2022-09-21 | Disposition: A | Discharge status: Home / Self Care | Payer: Medicaid Other | Attending: Emergency Medicine | Admitting: Emergency Medicine

## 2022-09-21 DIAGNOSIS — Z202 Contact with and (suspected) exposure to infections with a predominantly sexual mode of transmission: Secondary | ICD-10-CM

## 2022-09-21 MED ORDER — DOXYCYCLINE HYCLATE 100 MG PO CAPS: 100.0000 mg | ORAL_CAPSULE | Freq: Two times a day (BID) | ORAL | 0 refills | Status: DC

## 2022-09-21 MED ORDER — DOXYCYCLINE HYCLATE 100 MG PO TABS
100.0000 mg | ORAL_TABLET | Freq: Once | ORAL | Status: AC
  Administered 2022-09-21: 100 mg via ORAL
  Filled 2022-09-21: qty 1

## 2022-09-21 MED ORDER — CEFTRIAXONE SODIUM 500 MG IJ SOLR
500.0000 mg | Freq: Once | INTRAMUSCULAR | Status: AC
  Administered 2022-09-21: 500 mg via INTRAMUSCULAR
  Filled 2022-09-21: qty 500

## 2022-09-21 MED ORDER — STERILE WATER FOR INJECTION IJ SOLN
INTRAMUSCULAR | Status: AC
  Administered 2022-09-21: 10 mL
  Filled 2022-09-21: qty 10
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
## 2022-09-21 NOTE — Discharge Instructions (Addendum)
You are seen and treated today for gonorrhea and chlamydia.  Follow-up with your primary care doctor or the health department for more complete STI testing.  Come back to the ER if you have new or worsening symptoms.  Make sure you use condoms when having sexual intercourse.  Make sure yourself and your partner have both been fully treated before resuming sexual activity

## 2022-09-21 NOTE — ED Provider Notes (Signed)
[place] EMERGENCY DEPARTMENT AT [facility] Provider Note   CSN: [id number] Arrival date & time: 09/20/22  2336     History  Chief Complaint  Patient presents with   Exposure to STD    Shawn Ramsey is a 42 y.o. male.  History of tracheostomy, morbid obesity, hypertension.  He reports his partner was recently diagnosed with chlamydia and he would like to be tested.  Admits to some occasional mild dysuria.  No fever or chills, no nausea or vomiting.  No testicular pain or rash.   Exposure to STD       Home Medications Prior to Admission medications   Medication Sig Start Date End Date Taking? Authorizing Provider  doxycycline (VIBRAMYCIN) 100 MG capsule Take 1 capsule (100 mg total) by mouth 2 (two) times daily. 09/21/22  Yes Kentarius Partington A, PA-C  albuterol (PROVENTIL) (2.5 MG/3ML) 0.083% nebulizer solution Take 3 mLs (2.5 mg total) by nebulization every 6 (six) hours as needed for wheezing or shortness of breath. 04/22/21   Coralyn Helling, MD  albuterol (VENTOLIN HFA) 108 (90 Base) MCG/ACT inhaler Inhale 1-2 puffs into the lungs every 6 (six) hours as needed for wheezing or shortness of breath. 08/26/21   Kerri Perches, MD  atorvastatin (LIPITOR) 40 MG tablet Take 1 tablet by mouth once daily 09/15/22   Kerri Perches, MD  azithromycin (ZITHROMAX) 250 MG tablet Take 2 tablets on day 1, then 1 tablet daily on days 2 through 5 09/15/22   Del Newman Nip, [place], FNP  Diclofenac Sodium 3 % GEL Apply to left knee three times daily, as needed, for pain 05/04/22   Kerri Perches, MD  melatonin 5 MG TABS Take 1 tablet (5 mg total) by mouth at bedtime. 07/15/20   Kerri Perches, MD  potassium chloride SA (KLOR-CON M) 20 MEQ tablet Take 1 tablet (20 mEq total) by mouth 2 (two) times daily. 03/23/22   Kerri Perches, MD  PULMICORT 0.25 MG/2ML nebulizer solution Take 2 mLs (0.25 mg total) by nebulization in the morning and at bedtime. 04/22/21   Coralyn Helling,  MD  sertraline (ZOLOFT) 100 MG tablet Take 2 tablets (200 mg total) by mouth daily. 06/08/22 08/07/22  Neysa Hotter, MD  sildenafil (VIAGRA) 100 MG tablet TAKE 1/2 TO 1 (ONE-HALF TO ONE) TABLET BY MOUTH ONCE DAILY AS NEEDED FOR  ERECTILE  DYSFUNCTION 08/22/22   Kerri Perches, MD  tirzepatide [facility]) 7.5 MG/0.5ML Pen Inject 7.5 mg into the skin once a week. 07/26/22   Anabel Halon, MD  torsemide (DEMADEX) 20 MG tablet Take 1 tablet (20 mg total) by mouth 2 (two) times daily. 03/11/22   Kerri Perches, MD  Vitamin D, Ergocalciferol, (DRISDOL) 1.25 MG (50000 UNIT) CAPS capsule Take 1 capsule (50,000 Units total) by mouth every 7 (seven) days. 02/16/22   Kerri Perches, MD  zolpidem (AMBIEN) 5 MG tablet Take 1 tablet (5 mg total) by mouth at bedtime as needed for sleep. 01/06/22   Coralyn Helling, MD      Allergies    Patient has no known allergies.    Review of Systems   Review of Systems  Physical Exam Updated Vital Signs Ht 5\' 5"  (1.651 m)   Wt 119.9 kg   BMI 43.99 kg/m  Physical Exam Vitals and nursing note reviewed.  Constitutional:      General: He is not in acute distress.    Appearance: He is well-developed.  HENT:  Head: Normocephalic and atraumatic.     Nose: Nose normal.     Mouth/Throat:     Mouth: Mucous membranes are moist.  Eyes:     Conjunctiva/sclera: Conjunctivae normal.  Cardiovascular:     Rate and Rhythm: Normal rate and regular rhythm.     Heart sounds: No murmur heard. Pulmonary:     Effort: Pulmonary effort is normal. No respiratory distress.     Breath sounds: Normal breath sounds.  Abdominal:     Palpations: Abdomen is soft.     Tenderness: There is no abdominal tenderness.  Musculoskeletal:        General: No swelling.     Cervical back: Neck supple.  Skin:    General: Skin is warm and dry.     Capillary Refill: Capillary refill takes less than 2 seconds.  Neurological:     General: No focal deficit present.     Mental Status:  He is alert and oriented to person, place, and time.  Psychiatric:        Mood and Affect: Mood normal.     ED Results / Procedures / Treatments   Labs (all labs ordered are listed, but only abnormal results are displayed) Labs Reviewed  GC/CHLAMYDIA PROBE AMP ([place]) NOT AT [facility]    EKG None  Radiology No results found.  Procedures Procedures    Medications Ordered in ED Medications  cefTRIAXone (ROCEPHIN) injection 500 mg (has no administration in time range)  doxycycline (VIBRA-TABS) tablet 100 mg (has no administration in time range)    ED Course/ Medical Decision Making/ A&P                                 Medical Decision Making DDx: STI, UTI, other ED course: Patient exposed to chlamydia and wants testing and treatment.  Will treat empirically.  Discussed the need to follow-up with his PCP for more complete STI testing.   Risk Prescription drug management.           Final Clinical Impression(s) / ED Diagnoses Final diagnoses:  STD exposure    Rx / DC Orders ED Discharge Orders          Ordered    doxycycline (VIBRAMYCIN) 100 MG capsule  2 times daily        09/21/22 0054              Ma Rings, PA-C 09/21/22 0059    Gilda Crease, MD 09/21/22 [phone]

## 2022-09-22 ENCOUNTER — Telehealth: Payer: Self-pay

## 2022-09-22 NOTE — Transitions of Care (Post Inpatient/ED Visit) (Signed)
   09/22/2022  Name: Shawn Ramsey MRN: [id number] DOB: 1980-11-08  Today's TOC FU Call Status: Today's TOC FU Call Status:: Unsuccessful Call (2nd Attempt) Unsuccessful Call (1st Attempt) Date: 09/22/22 Unsuccessful Call (2nd Attempt) Date: 09/22/22  Attempted to reach the patient regarding the most recent Inpatient/ED visit.  Follow Up Plan: No further outreach attempts will be made at this time. We have been unable to contact the patient.  Signature   Woodfin Ganja LPN [facility] Nurse Health Advisor Direct Dial [phone]

## 2022-09-22 NOTE — Transitions of Care (Post Inpatient/ED Visit) (Signed)
   09/22/2022  Name: Shawn Ramsey MRN: [id number] DOB: Feb 18, 1981  Today's TOC FU Call Status: Today's TOC FU Call Status:: Unsuccessful Call (1st Attempt) Unsuccessful Call (1st Attempt) Date: 09/22/22  Attempted to reach the patient regarding the most recent Inpatient/ED visit.  Follow Up Plan: Additional outreach attempts will be made to reach the patient to complete the Transitions of Care (Post Inpatient/ED visit) call.   Signature   Woodfin Ganja LPN [facility] Nurse Health Advisor Direct Dial [phone]

## 2022-09-26 ENCOUNTER — Institutional Professional Consult (permissible substitution): Payer: Medicaid Other | Admitting: Plastic Surgery

## 2022-09-26 ENCOUNTER — Telehealth (INDEPENDENT_AMBULATORY_CARE_PROVIDER_SITE_OTHER): Payer: No Typology Code available for payment source | Admitting: Psychiatry

## 2022-09-26 ENCOUNTER — Encounter: Payer: Self-pay | Admitting: Psychiatry

## 2022-09-26 DIAGNOSIS — F1011 Alcohol abuse, in remission: Secondary | ICD-10-CM

## 2022-09-26 DIAGNOSIS — F3341 Major depressive disorder, recurrent, in partial remission: Secondary | ICD-10-CM

## 2022-09-26 MED ORDER — SERTRALINE HCL 100 MG PO TABS: 200.0000 mg | ORAL_TABLET | Freq: Every day | ORAL | 2 refills | Status: DC

## 2022-09-26 MED ORDER — NALTREXONE HCL 50 MG PO TABS: 25.0000 mg | ORAL_TABLET | Freq: Every day | ORAL | 2 refills | Status: AC

## 2022-09-26 NOTE — Patient Instructions (Signed)
Continue sertraline 200 mg at night  Continue naltrexone 25 mg at night  Next appointment: 10/28 at 3 PM,

## 2022-09-29 ENCOUNTER — Inpatient Hospital Stay (HOSPITAL_COMMUNITY)
Admission: RE | Admit: 2022-09-29 | Discharge: 2022-09-29 | Disposition: A | Discharge status: Home / Self Care | Payer: Medicaid Other | Source: Ambulatory Visit

## 2022-09-29 DIAGNOSIS — Z930 Tracheostomy status: Secondary | ICD-10-CM

## 2022-09-29 DIAGNOSIS — G4733 Obstructive sleep apnea (adult) (pediatric): Secondary | ICD-10-CM | POA: Diagnosis present

## 2022-09-30 ENCOUNTER — Inpatient Hospital Stay (HOSPITAL_COMMUNITY): Admission: RE | Admit: 2022-09-30 | Payer: Medicaid Other | Source: Ambulatory Visit

## 2022-09-30 NOTE — Progress Notes (Incomplete)
Reason for visit  Trach management/ possible decannulation   HPI Shawn Ramsey is well known to me. I follow him for trach management in the setting of MO w/ associated OHS and OSA. Happy to report he has had significant weight loss and recently had successful PSG and now has home CPAP. We have been working on assessing for decannulation. Over last two visits still having compliance issues w/ his CPAP. "When I fall asleep I forget to put it on". Started on [place] for weight loss.   ROS  Exam     Procedure The 6 trach was removed. Site inspected. Was unremarkable. New trach placed over obturator. Pt tolerated well. Placement confirmed via ETCO2   Impression/plan Principal Problem:   Tracheostomy status (HCC) Active Problems:   OSA (obstructive sleep apnea)   Morbid obesity due to excess calories (HCC)    Tracheostomy status (HCC) Overview: Followed at [facility]  Size 6 cuffless proximal XLT shiley Last change ** (previously 4/11)  Discussion Still not doing great w/ compliance re: CPAP ** His trach is stable and he is clear for decannulation IMO once he can prove he will use his CPAP regularly   Plan ROV 8 weeks to assess for decannulation vs routine trach change

## 2022-10-11 ENCOUNTER — Telehealth: Payer: Self-pay | Admitting: Family Medicine

## 2022-10-11 NOTE — Telephone Encounter (Signed)
Shawn Ramsey called asking does patient have COPD.

## 2022-10-12 ENCOUNTER — Telehealth: Payer: Self-pay | Admitting: Family Medicine

## 2022-10-12 NOTE — Telephone Encounter (Signed)
Disability update   Noted  Copied Sleeved  Original in PCP box Copy front desk folder

## 2022-10-19 ENCOUNTER — Telehealth: Payer: Self-pay | Admitting: Internal Medicine

## 2022-10-19 NOTE — Telephone Encounter (Signed)
He cannot see [facility] for OSA. Will need to see another sleep provider. Also, was to follow up in Jan 24, so needs OV before new order can be placed. If problem with machine they can contact [employer].

## 2022-10-19 NOTE — Telephone Encounter (Signed)
PT's mom states he needs a new CPAP and [employer] is requesting [place] to send in a RX for one.  Her # is [phone]  Please call to advise. Thanks.   FAX # at [employer]: [phone]

## 2022-10-20 ENCOUNTER — Telehealth: Payer: Self-pay | Admitting: Family Medicine

## 2022-10-20 ENCOUNTER — Ambulatory Visit (HOSPITAL_COMMUNITY)
Admission: RE | Admit: 2022-10-20 | Discharge: 2022-10-20 | Disposition: A | Discharge status: Home / Self Care | Payer: Medicaid Other | Source: Ambulatory Visit | Attending: Family Medicine | Admitting: Family Medicine

## 2022-10-20 DIAGNOSIS — G4733 Obstructive sleep apnea (adult) (pediatric): Secondary | ICD-10-CM | POA: Diagnosis not present

## 2022-10-20 DIAGNOSIS — F17210 Nicotine dependence, cigarettes, uncomplicated: Secondary | ICD-10-CM | POA: Diagnosis present

## 2022-10-20 DIAGNOSIS — F17218 Nicotine dependence, cigarettes, with other nicotine-induced disorders: Secondary | ICD-10-CM | POA: Diagnosis present

## 2022-10-20 DIAGNOSIS — Z930 Tracheostomy status: Secondary | ICD-10-CM

## 2022-10-20 NOTE — Progress Notes (Signed)
Tracheostomy Procedure Note  Shawn Ramsey [id number] [birth year]  Pre Procedure Tracheostomy Information  Trach Brand: Shiley Size:  6.0 6XLTUP Style: Uncuffed Secured by: Velcro   Procedure: Trach Change and Trach Cleaning    Post Procedure Tracheostomy Information  Trach Brand: Shiley Size:  6.0 6XLTUP Style: Uncuffed Secured by: Velcro   Post Procedure Evaluation:  ETCO2 positive color change from yellow to purple : Yes.   Vital signs:VSS Patients current condition: stable Complications: No apparent complications Trach site exam: clean, dry Wound care done: 4 x 4 gauze drain Patient did tolerate procedure well.   Education: none  Prescription needs: none    Additional needs: New PMV given to patient at this visit

## 2022-10-20 NOTE — Progress Notes (Signed)
Reason for visit  Trach management/ possible decannulation   HPI Shelia is well known to me. I follow him for trach management in the setting of MO w/ associated OHS and OSA. Happy to report he has had significant weight loss and recently had successful PSG and now has home CPAP. We have been working on assessing for decannulation. Still not really compliant w/ his CPAP. "When I fall asleep I forget to put it on". Started on [place] for weight loss. Feels like his weight is dropping. Here today for planned trach change   Review of Systems  Constitutional: Negative.   HENT: Negative.    Eyes: Negative.   Respiratory: Negative.    Cardiovascular: Negative.   Gastrointestinal: Negative.   Genitourinary: Negative.   Musculoskeletal: Negative.   Skin: Negative.   Endo/Heme/Allergies: Negative.     Exam   General 42 year old male. No distress HENT NCAT no JVD trach is mid line. Phonation quality good. He did have sig biofilm and nicotine staining on his trach  Pulm clear, no accessory use  Card rrr, no MRG Abd soft Ext warm and dry  Neuro intact   Procedure The 6 trach was removed. Site inspected and as recorded above. New 6 prox XLT was placed w/out difficulty over obturator. PT tolerated well. Placement confirmed via ETCO2, new trach ties affixed. Tolerated well   Impression/plan Principal Problem:   Tracheostomy status (HCC) Active Problems:   OSA (obstructive sleep apnea)   Morbid obesity due to excess calories (HCC)   Cigarette nicotine dependence    Tracheostomy status (HCC) Overview: Followed at [facility].  Size 6 cuffless proximal XLT shiley Last change today 8/29 (previously 4/11)  Discussion Still not doing great w/ compliance re: CPAP he says he still can't tolerate mask His trach is stable and he is clear for decannulation IMO once he can prove he will use his CPAP regularly   Plan ROV 12 weeks to assess compliance w/ CPAP and readiness  for decannulation (I am doubtful that he will be) Cont routine trach care Have reached out to [facility] re: seeing about mask fitting      Cigarette nicotine dependence with other nicotine-induced disorder Assessment & Plan: Still smokes Encouraged him to stop        My time 16 minutes  Simonne Martinet ACNP-BC [facility] Pulmonary/Critical Care Pager # [phone] OR # [phone] if no answer

## 2022-10-20 NOTE — Telephone Encounter (Signed)
Mother called in on patient behalf . Patient has had recent trach changer and now has infection , wants to see if provider can send in Prednisone.  Wants a call back

## 2022-10-20 NOTE — Assessment & Plan Note (Signed)
Still smokes Encouraged him to stop

## 2022-10-21 NOTE — Telephone Encounter (Signed)
Patients mother aware  

## 2022-10-25 ENCOUNTER — Telehealth: Payer: Self-pay | Admitting: Acute Care

## 2022-10-25 ENCOUNTER — Other Ambulatory Visit: Payer: Self-pay | Admitting: Family Medicine

## 2022-10-25 ENCOUNTER — Encounter: Payer: Self-pay | Admitting: Acute Care

## 2022-10-25 DIAGNOSIS — G4733 Obstructive sleep apnea (adult) (pediatric): Secondary | ICD-10-CM

## 2022-10-25 NOTE — Telephone Encounter (Signed)
Lmtrc-kg

## 2022-10-25 NOTE — Telephone Encounter (Signed)
Can someone put in a order to be sent to [facility] to get them to help him out with a mask

## 2022-10-26 ENCOUNTER — Encounter: Payer: Self-pay | Admitting: Pulmonary Disease

## 2022-10-26 ENCOUNTER — Ambulatory Visit (INDEPENDENT_AMBULATORY_CARE_PROVIDER_SITE_OTHER): Payer: Medicaid Other | Admitting: Pulmonary Disease

## 2022-10-26 VITALS — BP 118/76 | HR 91 | Ht 65.0 in | Wt 268.0 lb

## 2022-10-26 DIAGNOSIS — G4733 Obstructive sleep apnea (adult) (pediatric): Secondary | ICD-10-CM | POA: Diagnosis not present

## 2022-10-26 DIAGNOSIS — J9612 Chronic respiratory failure with hypercapnia: Secondary | ICD-10-CM

## 2022-10-26 DIAGNOSIS — E1169 Type 2 diabetes mellitus with other specified complication: Secondary | ICD-10-CM | POA: Insufficient documentation

## 2022-10-26 DIAGNOSIS — Z930 Tracheostomy status: Secondary | ICD-10-CM

## 2022-10-26 DIAGNOSIS — J9611 Chronic respiratory failure with hypoxia: Secondary | ICD-10-CM | POA: Diagnosis not present

## 2022-10-26 DIAGNOSIS — E119 Type 2 diabetes mellitus without complications: Secondary | ICD-10-CM | POA: Insufficient documentation

## 2022-10-26 NOTE — Telephone Encounter (Signed)
Order placed
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
## 2022-10-26 NOTE — Assessment & Plan Note (Signed)
He will need to be compliant with PAP before considering decannulation

## 2022-10-26 NOTE — Assessment & Plan Note (Signed)
He qualifies for oxygen on ambulation test today

## 2022-10-26 NOTE — Telephone Encounter (Signed)
Patient seen in office today. [facility] has attempted to contact patient to schedule mask fitting and he has not returned their calls. He is aware to contact them to schedule.

## 2022-10-26 NOTE — Patient Instructions (Signed)
X Amb sat to requalify for O2  X Mask fitting to DME Get back on your CPAP machine - you have to use it for at at least 6h every night if you want the tracheostomy to come out

## 2022-10-26 NOTE — Assessment & Plan Note (Signed)
His CPAP compliance has dropped off. We will try to arrange another mask fitting session for him with DME/[facility]-mom says she will call to make the appointment. He has obesity hypoventilation syndrome and previously needed BiPAP.  Instead of providing him with replacement PAP I will schedule repeat titration study when he is close to decannulation  Weight loss encouraged, compliance with goal of at least 4-6 hrs every night is the expectation. Advised against medications with sedative side effects Cautioned against driving when sleepy - understanding that sleepiness will vary on a day to day basis

## 2022-11-01 ENCOUNTER — Other Ambulatory Visit: Payer: Self-pay | Admitting: Internal Medicine

## 2022-11-01 ENCOUNTER — Telehealth: Payer: Self-pay | Admitting: Pulmonary Disease

## 2022-11-01 DIAGNOSIS — E1169 Type 2 diabetes mellitus with other specified complication: Secondary | ICD-10-CM

## 2022-11-01 NOTE — Telephone Encounter (Signed)
Patient 's mother called and states the original order for the oxygen concentrator was sent to [facility].  Insurance will not cover this,  Please sent order to [facility] instead.  Patient requests call back when completed.

## 2022-11-03 NOTE — Telephone Encounter (Signed)
Synetta Fail - please follow up on this.  Thanks!

## 2022-11-04 ENCOUNTER — Ambulatory Visit: Payer: Medicaid Other | Admitting: Internal Medicine

## 2022-11-04 NOTE — Telephone Encounter (Signed)
I called [facility] to see who was in network with the patient's plan. Spero Geralds stated I needed to call [facility]. I called [employer] and had to leave a message asking them to call me back

## 2022-11-08 NOTE — Telephone Encounter (Signed)
Referral was sent to [facility] on 11/04/22

## 2022-11-11 NOTE — Telephone Encounter (Signed)
I received confirmation from [place] with [facility] that she has this 02 order

## 2022-11-11 NOTE — Telephone Encounter (Signed)
Pt was told to send order to [facility]

## 2022-11-11 NOTE — Telephone Encounter (Signed)
Told mom to have him call us for an appt needed to get a new CPAP. NFN

## 2022-11-11 NOTE — Telephone Encounter (Signed)
02 order has been sent to [facility] per patient's requeste

## 2022-11-15 IMAGING — DX DG CHEST 2V
2 series · 2 of 2 positions shown · non-contrast
Comparison: CT 06/13/2020.  Chest x-ray 06/13/2020.

CLINICAL DATA: Chest pain.

EXAM:
CHEST - 2 VIEW

[chest pa]
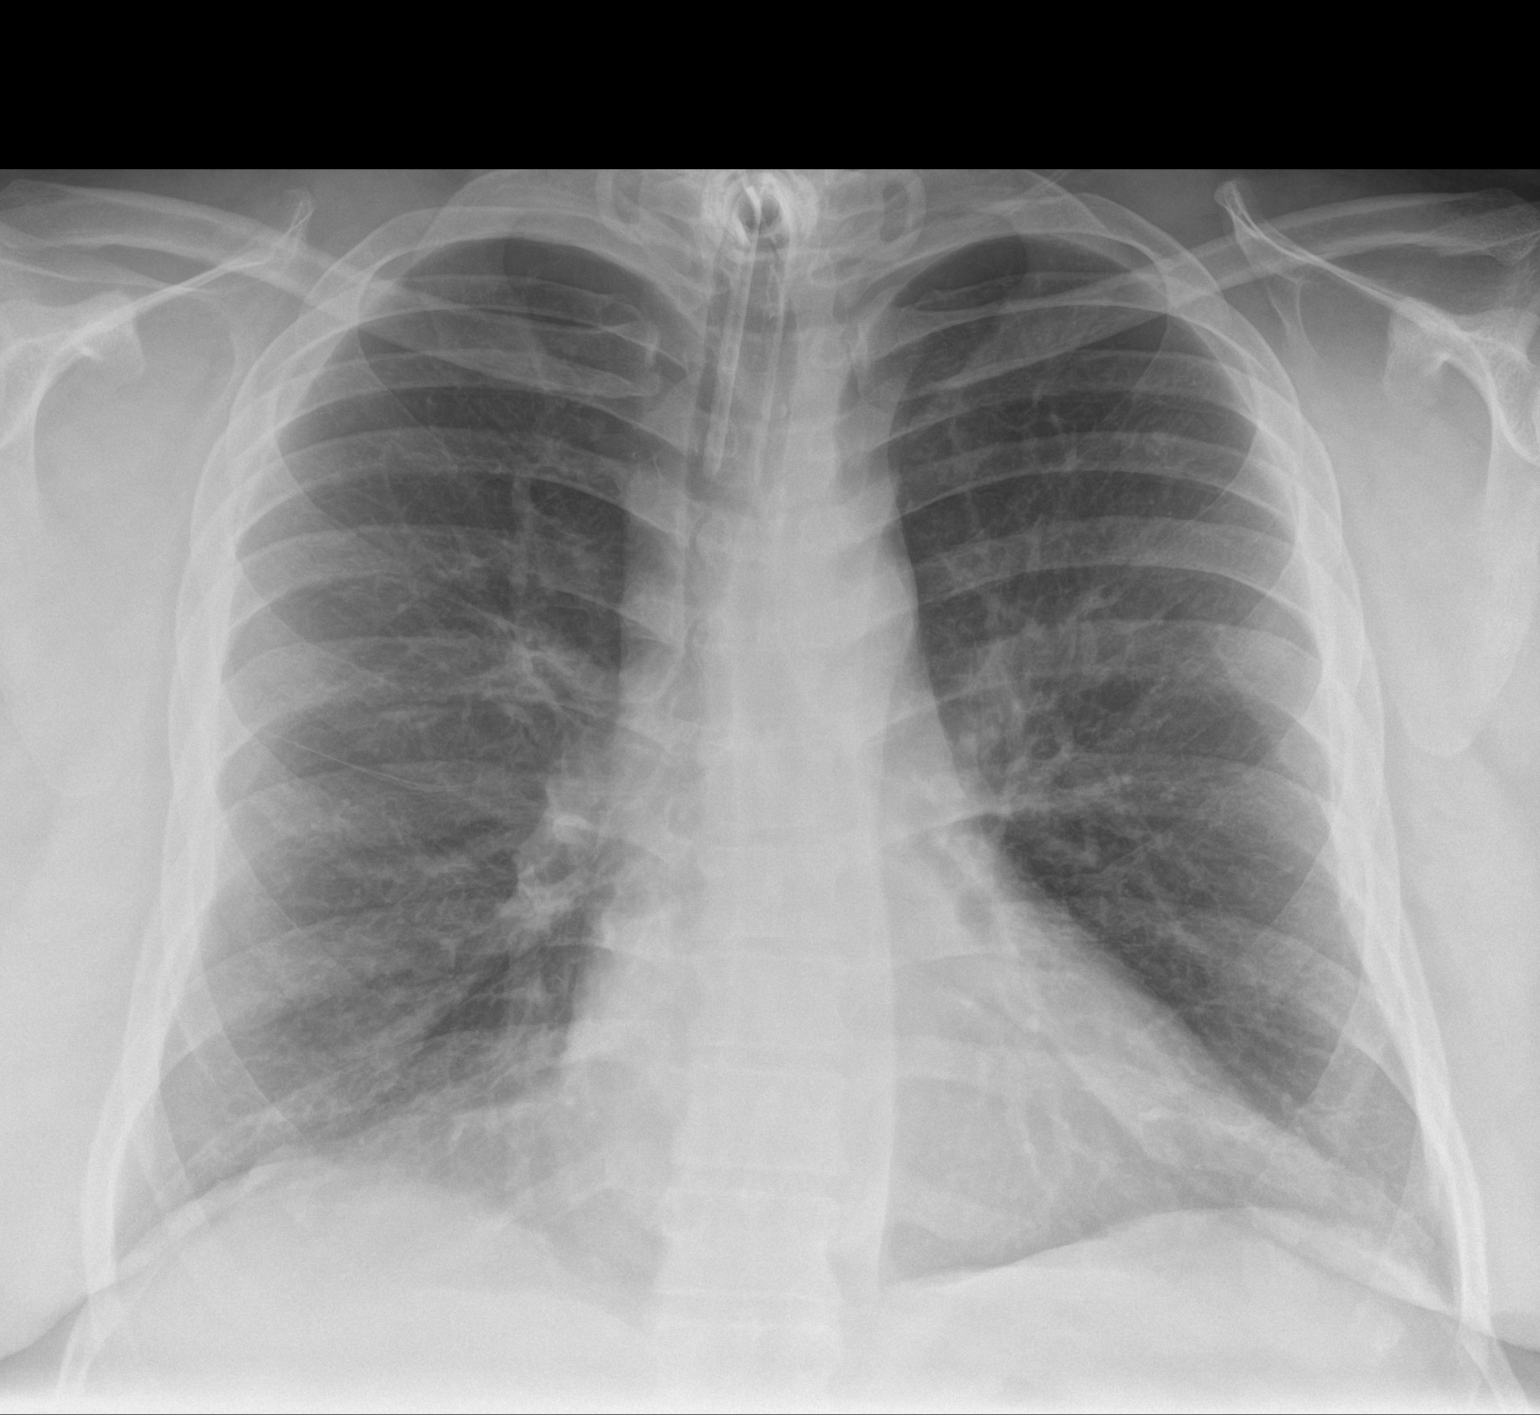

[chest lat]
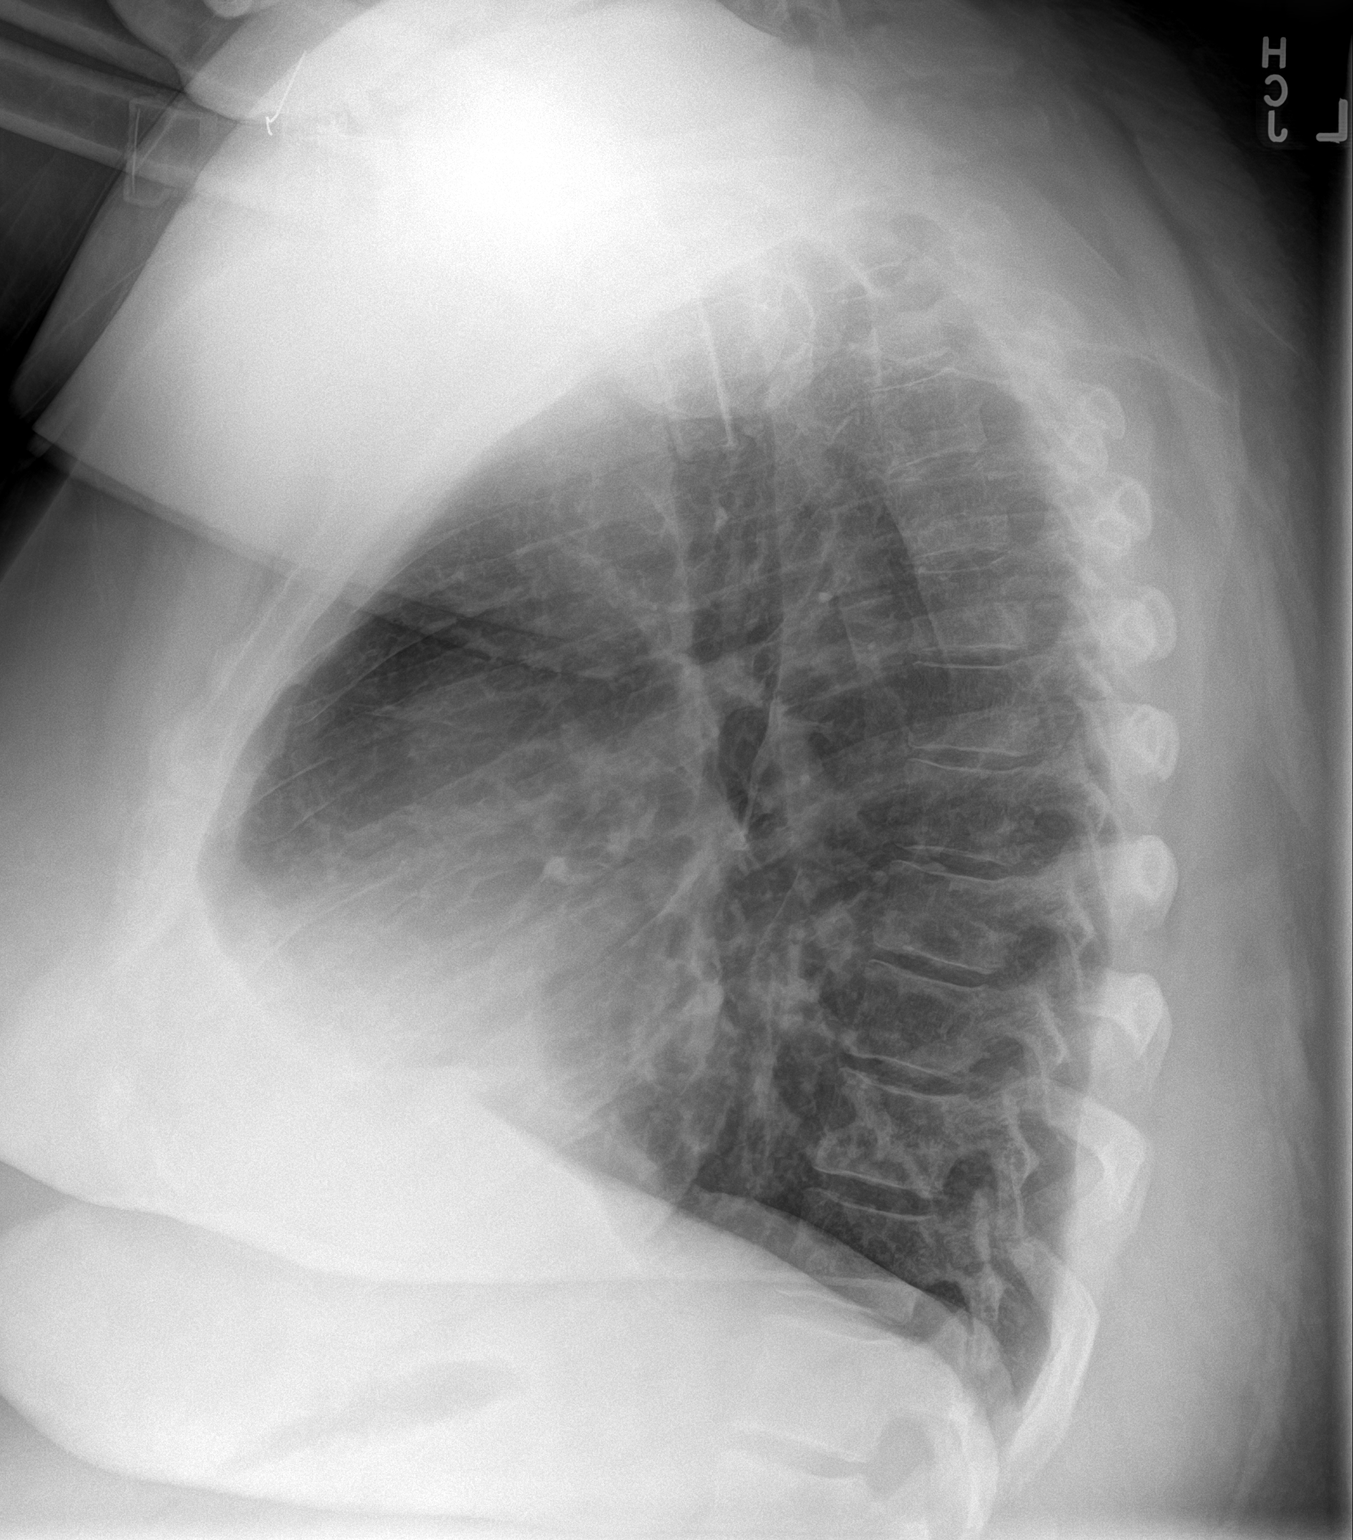

[2 of 2 positions shown; findings below may reference images not displayed]

FINDINGS: Tracheostomy tube noted in stable position. Cardiomegaly. No
pulmonary venous congestion. No acute infiltrate. Stable bilateral
pleural thickening. No pleural effusion or pneumothorax.
IMPRESSION: 1.  Tracheostomy tube noted stable position.

2.  Cardiomegaly.  No pulmonary venous congestion.

3.  No acute infiltrate noted.

## 2022-12-07 ENCOUNTER — Ambulatory Visit: Payer: Medicaid Other | Admitting: Family Medicine

## 2022-12-08 ENCOUNTER — Encounter: Payer: Self-pay | Admitting: Family Medicine

## 2022-12-12 NOTE — Progress Notes (Signed)
His mother answered the phone (it was preferred number) when I called to do the appointment. She states that he might be a irritable, although she denies any concern about aggression. Rogue is hoping to go into some residential treatment, where he can see a therapist. She advised to contact [phone] for the appointment.   I called his cell phone twice [phone]. He did not answer the phone. No option to leave a voice message.

## 2022-12-14 ENCOUNTER — Telehealth: Payer: Self-pay | Admitting: Family Medicine

## 2022-12-14 NOTE — Telephone Encounter (Signed)
Patient mother calling requesting a call from MD or somebody says it's urgent would not specify. Thanks

## 2022-12-14 NOTE — Telephone Encounter (Signed)
Spoke with patients mother appointment changed to video visit due to patient being out of town

## 2022-12-19 ENCOUNTER — Telehealth (INDEPENDENT_AMBULATORY_CARE_PROVIDER_SITE_OTHER): Payer: No Typology Code available for payment source | Admitting: Psychiatry

## 2022-12-19 DIAGNOSIS — Z91199 Patient's noncompliance with other medical treatment and regimen due to unspecified reason: Secondary | ICD-10-CM

## 2022-12-20 ENCOUNTER — Telehealth: Payer: Medicaid Other | Admitting: Family Medicine

## 2022-12-27 ENCOUNTER — Other Ambulatory Visit: Payer: Self-pay

## 2022-12-27 ENCOUNTER — Emergency Department (HOSPITAL_COMMUNITY): Payer: Medicaid Other

## 2022-12-27 ENCOUNTER — Emergency Department (HOSPITAL_COMMUNITY)
Admission: EM | Admit: 2022-12-27 | Discharge: 2022-12-28 | Disposition: A | Discharge status: Home / Self Care | Payer: Medicaid Other | Attending: Emergency Medicine | Admitting: Emergency Medicine

## 2022-12-27 DIAGNOSIS — R1032 Left lower quadrant pain: Secondary | ICD-10-CM | POA: Diagnosis not present

## 2022-12-27 DIAGNOSIS — K2900 Acute gastritis without bleeding: Secondary | ICD-10-CM

## 2022-12-27 DIAGNOSIS — R109 Unspecified abdominal pain: Secondary | ICD-10-CM | POA: Diagnosis present

## 2022-12-27 LAB — CBC
HCT: 42 % (ref 39.0–52.0)
Hemoglobin: 14.3 g/dL (ref 13.0–17.0)
MCH: 30.9 pg (ref 26.0–34.0)
MCHC: 34 g/dL (ref 30.0–36.0)
MCV: 90.7 fL (ref 80.0–100.0)
Platelets: 325 10*3/uL (ref 150–400)
RBC: 4.63 MIL/uL (ref 4.22–5.81)
RDW: 16.2 % — ABNORMAL HIGH (ref 11.5–15.5)
WBC: 6.4 10*3/uL (ref 4.0–10.5)
nRBC: 0 % (ref 0.0–0.2)

## 2022-12-27 LAB — BASIC METABOLIC PANEL
Anion gap: 8 (ref 5–15)
BUN: 15 mg/dL (ref 6–20)
CO2: 25 mmol/L (ref 22–32)
Calcium: 8.6 mg/dL — ABNORMAL LOW (ref 8.9–10.3)
Chloride: 110 mmol/L (ref 98–111)
Creatinine, Ser: 0.66 mg/dL (ref 0.61–1.24)
GFR, Estimated: >60 mL/min (ref >60)
Glucose, Bld: 111 mg/dL — ABNORMAL HIGH (ref 70–99)
Potassium: 3.6 mmol/L (ref 3.5–5.1)
Sodium: 143 mmol/L (ref 135–145)

## 2022-12-27 LAB — LIPASE, BLOOD: Lipase: 51 U/L (ref 11–51)

## 2022-12-27 LAB — TROPONIN I (HIGH SENSITIVITY): Troponin I (High Sensitivity): 5 ng/L (ref <18)

## 2022-12-27 NOTE — ED Triage Notes (Signed)
Pt c/o abdominal and centralized chest pain x3 days. Endorses SOB with exertion. Denies any other symptoms

## 2022-12-28 ENCOUNTER — Emergency Department (HOSPITAL_COMMUNITY): Payer: Medicaid Other

## 2022-12-28 LAB — URINALYSIS, ROUTINE W REFLEX MICROSCOPIC
Bacteria, UA: NONE SEEN
Bilirubin Urine: NEGATIVE
Glucose, UA: NEGATIVE mg/dL
Hgb urine dipstick: NEGATIVE
Ketones, ur: NEGATIVE mg/dL
Nitrite: NEGATIVE
Protein, ur: 100 mg/dL — AB
Specific Gravity, Urine: 1.028 (ref 1.005–1.030)
pH: 8 (ref 5.0–8.0)

## 2022-12-28 LAB — TROPONIN I (HIGH SENSITIVITY): Troponin I (High Sensitivity): 5 ng/L (ref <18)

## 2022-12-28 MED ORDER — SUCRALFATE 1 GM/10ML PO SUSP: 1.0000 g | Freq: Three times a day (TID) | ORAL | 0 refills | Status: DC

## 2022-12-28 MED ORDER — PANTOPRAZOLE SODIUM 40 MG PO TBEC: 40.0000 mg | DELAYED_RELEASE_TABLET | Freq: Every day | ORAL | 3 refills | Status: DC

## 2022-12-28 MED ORDER — IOHEXOL 300 MG/ML  SOLN
100.0000 mL | Freq: Once | INTRAMUSCULAR | Status: AC | PRN
  Administered 2022-12-28: 100 mL via INTRAVENOUS

## 2022-12-28 NOTE — ED Provider Notes (Signed)
[place] EMERGENCY DEPARTMENT AT [facility] Provider Note   CSN: [id number] Arrival date & time: 12/27/22  2216     History  Chief Complaint  Patient presents with   Abdominal Pain        Chest Pain    Shawn Ramsey is a 42 y.o. male.  Presents to the emergency department with complaints of abdominal and chest pain.  Symptoms come and go for the last 3 days.  He thinks spicy foods make the pain worse.       Home Medications Prior to Admission medications   Medication Sig Start Date End Date Taking? Authorizing Provider  pantoprazole (PROTONIX) 40 MG tablet Take 1 tablet (40 mg total) by mouth daily. 12/28/22  Yes Mihir Flanigan, Canary Brim, MD  sucralfate (CARAFATE) 1 GM/10ML suspension Take 10 mLs (1 g total) by mouth 4 (four) times daily -  with meals and at bedtime. 12/28/22  Yes Kristine Chahal, Canary Brim, MD  albuterol (PROVENTIL) (2.5 MG/3ML) 0.083% nebulizer solution Take 3 mLs (2.5 mg total) by nebulization every 6 (six) hours as needed for wheezing or shortness of breath. 04/22/21   Coralyn Helling, MD  albuterol (VENTOLIN HFA) 108 (90 Base) MCG/ACT inhaler Inhale 1-2 puffs into the lungs every 6 (six) hours as needed for wheezing or shortness of breath. 08/26/21   Kerri Perches, MD  atorvastatin (LIPITOR) 40 MG tablet Take 1 tablet by mouth once daily 09/15/22   Kerri Perches, MD  Diclofenac Sodium 3 % GEL Apply to left knee three times daily, as needed, for pain 05/04/22   Kerri Perches, MD  melatonin 5 MG TABS Take 1 tablet (5 mg total) by mouth at bedtime. 07/15/20   Kerri Perches, MD  MOUNJARO 7.5 MG/0.5ML Pen INJECT 7.5 MG INTO THE SKIN  ONCE A WEEK 11/01/22   Kerri Perches, MD  potassium chloride SA (KLOR-CON M) 20 MEQ tablet Take 1 tablet (20 mEq total) by mouth 2 (two) times daily. 03/23/22   Kerri Perches, MD  PULMICORT 0.25 MG/2ML nebulizer solution Take 2 mLs (0.25 mg total) by nebulization in the morning and at bedtime. 04/22/21    Coralyn Helling, MD  sertraline (ZOLOFT) 100 MG tablet Take 2 tablets (200 mg total) by mouth daily. 09/26/22 12/25/22  Neysa Hotter, MD  sildenafil (VIAGRA) 100 MG tablet TAKE 1/2 TO 1 (ONE-HALF TO ONE) TABLET BY MOUTH ONCE DAILY AS NEEDED FOR  ERECTILE  DYSFUNCTION 10/25/22   Kerri Perches, MD  torsemide (DEMADEX) 20 MG tablet Take 1 tablet (20 mg total) by mouth 2 (two) times daily. 03/11/22   Kerri Perches, MD  Vitamin D, Ergocalciferol, (DRISDOL) 1.25 MG (50000 UNIT) CAPS capsule Take 1 capsule (50,000 Units total) by mouth every 7 (seven) days. 02/16/22   Kerri Perches, MD  zolpidem (AMBIEN) 5 MG tablet Take 1 tablet (5 mg total) by mouth at bedtime as needed for sleep. 01/06/22   Coralyn Helling, MD      Allergies    Patient has no known allergies.    Review of Systems   Review of Systems  Physical Exam Updated Vital Signs BP 123/71   Pulse 81   Temp 98.6 F (37 C) (Oral)   Resp (!) 21   Ht 5\' 5"  (1.651 m)   Wt 120.2 kg   SpO2 96%   BMI 44.10 kg/m  Physical Exam Vitals and nursing note reviewed.  Constitutional:      General: He is  not in acute distress.    Appearance: He is well-developed.  HENT:     Head: Normocephalic and atraumatic.     Mouth/Throat:     Mouth: Mucous membranes are moist.  Eyes:     General: Vision grossly intact. Gaze aligned appropriately.     Extraocular Movements: Extraocular movements intact.     Conjunctiva/sclera: Conjunctivae normal.  Cardiovascular:     Rate and Rhythm: Normal rate and regular rhythm.     Pulses: Normal pulses.     Heart sounds: Normal heart sounds, S1 normal and S2 normal. No murmur heard.    No friction rub. No gallop.  Pulmonary:     Effort: Pulmonary effort is normal. No respiratory distress.     Breath sounds: Normal breath sounds.  Abdominal:     Palpations: Abdomen is soft.     Tenderness: There is abdominal tenderness in the left lower quadrant. There is no guarding or rebound.     Hernia: No  hernia is present.  Musculoskeletal:        General: No swelling.     Cervical back: Full passive range of motion without pain, normal range of motion and neck supple. No pain with movement, spinous process tenderness or muscular tenderness. Normal range of motion.     Right lower leg: No edema.     Left lower leg: No edema.  Skin:    General: Skin is warm and dry.     Capillary Refill: Capillary refill takes less than 2 seconds.     Findings: No ecchymosis, erythema, lesion or wound.  Neurological:     Mental Status: He is alert and oriented to person, place, and time.     GCS: GCS eye subscore is 4. GCS verbal subscore is 5. GCS motor subscore is 6.     Cranial Nerves: Cranial nerves 2-12 are intact.     Sensory: Sensation is intact.     Motor: Motor function is intact. No weakness or abnormal muscle tone.     Coordination: Coordination is intact.  Psychiatric:        Mood and Affect: Mood normal.        Speech: Speech normal.        Behavior: Behavior normal.     ED Results / Procedures / Treatments   Labs (all labs ordered are listed, but only abnormal results are displayed) Labs Reviewed  BASIC METABOLIC PANEL - Abnormal; Notable for the following components:      Result Value   Glucose, Bld 111 (*)    Calcium 8.6 (*)    All other components within normal limits  CBC - Abnormal; Notable for the following components:   RDW 16.2 (*)    All other components within normal limits  URINALYSIS, ROUTINE W REFLEX MICROSCOPIC - Abnormal; Notable for the following components:   Color, Urine AMBER (*)    Protein, ur 100 (*)    Leukocytes,Ua TRACE (*)    All other components within normal limits  LIPASE, BLOOD  TROPONIN I (HIGH SENSITIVITY)  TROPONIN I (HIGH SENSITIVITY)    EKG EKG Interpretation Date/Time:  Tuesday December 27 2022 22:27:36 EST Ventricular Rate:  88 PR Interval:  140 QRS Duration:  92 QT Interval:  378 QTC Calculation: 457 R Axis:   -31  Text  Interpretation: Normal sinus rhythm Left axis deviation Nonspecific T wave abnormality Abnormal ECG When compared with ECG of 17-Mar-2022 14:47, No significant change was found Confirmed by Gilda Crease [phone])  on 12/27/2022 11:58:08 PM  Radiology CT ABDOMEN PELVIS W CONTRAST  Result Date: 12/28/2022 CLINICAL DATA:  Left lower quadrant pain, chest pain EXAM: CT ABDOMEN AND PELVIS WITH CONTRAST TECHNIQUE: Multidetector CT imaging of the abdomen and pelvis was performed using the standard protocol following bolus administration of intravenous contrast. RADIATION DOSE REDUCTION: This exam was performed according to the departmental dose-optimization program which includes automated exposure control, adjustment of the mA and/or kV according to patient size and/or use of iterative reconstruction technique. CONTRAST:  OMNIPAQUE IOHEXOL 300 MG/ML  SOLN COMPARISON:  Ultrasound 07/02/2021 FINDINGS: Lower chest: No acute abnormality Hepatobiliary: Several hypodensities in the liver with apparent peripheral puddling of contrast suggesting hemangiomas. This was noted on prior ultrasound also compatible with hemangioma. Small gallstone within the gallbladder which is contracted. No biliary ductal dilatation. Pancreas: No focal abnormality or ductal dilatation. Spleen: No focal abnormality.  Normal size. Adrenals/Urinary Tract: No adrenal abnormality. No focal renal abnormality. No stones or hydronephrosis. Urinary bladder is unremarkable. Stomach/Bowel: Stomach, large and small bowel grossly unremarkable. Vascular/Lymphatic: No evidence of aneurysm or adenopathy. Reproductive: No visible focal abnormality. Other: No free fluid or free air. Musculoskeletal: No acute bony abnormality. IMPRESSION: No acute findings in the abdomen or pelvis. Small gallstone within the gallbladder. No evidence of acute cholecystitis. Electronically Signed   By: Charlett Nose M.D.   On: 12/28/2022 01:19   DG Chest 2 View  Result  Date: 12/27/2022 CLINICAL DATA:  Chest pain. EXAM: CHEST - 2 VIEW COMPARISON:  September 15, 2022 FINDINGS: There is stable tracheostomy tube positioning. The heart size and mediastinal contours are within normal limits. Mild, chronic appearing increased lung markings are seen with mild atelectasis noted within the bilateral lung bases. There is no evidence of focal consolidation, pleural effusion or pneumothorax. The visualized skeletal structures are unremarkable. IMPRESSION: Mild bibasilar atelectasis. Electronically Signed   By: Aram Candela M.D.   On: 12/27/2022 23:24    Procedures Procedures    Medications Ordered in ED Medications  iohexol (OMNIPAQUE) 300 MG/ML solution 100 mL (100 mLs Intravenous Contrast Given 12/28/22 0029)    ED Course/ Medical Decision Making/ A&P                                 Medical Decision Making Amount and/or Complexity of Data Reviewed Labs: ordered. Radiology: ordered.  Risk Prescription drug management.   Presents with left-sided abdominal pain.  Patient reports that pain comes and goes.  It gets worse with spicy foods.  Currently, however, he indicates more left lower quadrant pain and tenderness.  No signs of peritonitis on exam.  Pain is also in the chest, possibly referred from the abdomen.  Cardiac evaluation is negative.  Patient underwent CT abdomen and pelvis without acute changes.  Workup has been reassuring.  Will discharge with treatment for possible gastritis.        Final Clinical Impression(s) / ED Diagnoses Final diagnoses:  None    Rx / DC Orders ED Discharge Orders          Ordered    pantoprazole (PROTONIX) 40 MG tablet  Daily        12/28/22 0148    sucralfate (CARAFATE) 1 GM/10ML suspension  3 times daily with meals & bedtime        12/28/22 0148              Gilda Crease, MD 12/28/22 0148

## 2022-12-28 NOTE — ED Notes (Signed)
Patient transported to CT 

## 2023-01-04 ENCOUNTER — Other Ambulatory Visit: Payer: Self-pay | Admitting: Family Medicine
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
## 2023-01-10 ENCOUNTER — Telehealth: Payer: Medicaid Other | Admitting: Family Medicine

## 2023-01-11 ENCOUNTER — Telehealth (INDEPENDENT_AMBULATORY_CARE_PROVIDER_SITE_OTHER): Payer: No Typology Code available for payment source | Admitting: Psychiatry

## 2023-01-11 ENCOUNTER — Encounter: Payer: Self-pay | Admitting: Psychiatry

## 2023-01-11 DIAGNOSIS — F3341 Major depressive disorder, recurrent, in partial remission: Secondary | ICD-10-CM | POA: Diagnosis not present

## 2023-01-11 DIAGNOSIS — Z79899 Other long term (current) drug therapy: Secondary | ICD-10-CM

## 2023-01-11 DIAGNOSIS — F79 Unspecified intellectual disabilities: Secondary | ICD-10-CM | POA: Diagnosis not present

## 2023-01-11 DIAGNOSIS — F1020 Alcohol dependence, uncomplicated: Secondary | ICD-10-CM | POA: Diagnosis not present

## 2023-01-11 MED ORDER — SERTRALINE HCL 100 MG PO TABS: 200.0000 mg | ORAL_TABLET | Freq: Every day | ORAL | 2 refills | Status: DC

## 2023-01-13 ENCOUNTER — Ambulatory Visit (HOSPITAL_COMMUNITY)
Admission: EM | Admit: 2023-01-13 | Discharge: 2023-01-13 | Disposition: A | Discharge status: Home / Self Care | Payer: No Typology Code available for payment source | Attending: Behavioral Health | Admitting: Behavioral Health

## 2023-01-13 DIAGNOSIS — Z79899 Other long term (current) drug therapy: Secondary | ICD-10-CM | POA: Insufficient documentation

## 2023-01-13 DIAGNOSIS — F1090 Alcohol use, unspecified, uncomplicated: Secondary | ICD-10-CM | POA: Insufficient documentation

## 2023-01-13 DIAGNOSIS — J449 Chronic obstructive pulmonary disease, unspecified: Secondary | ICD-10-CM | POA: Insufficient documentation

## 2023-01-13 DIAGNOSIS — Z560 Unemployment, unspecified: Secondary | ICD-10-CM | POA: Insufficient documentation

## 2023-01-13 DIAGNOSIS — E119 Type 2 diabetes mellitus without complications: Secondary | ICD-10-CM | POA: Insufficient documentation

## 2023-01-13 DIAGNOSIS — Z91148 Patient's other noncompliance with medication regimen for other reason: Secondary | ICD-10-CM | POA: Insufficient documentation

## 2023-01-13 DIAGNOSIS — F1910 Other psychoactive substance abuse, uncomplicated: Secondary | ICD-10-CM

## 2023-01-13 NOTE — Discharge Instructions (Addendum)
Discharge recommendations:   Substance abuse: Please review list of outpatient and residential treatment facilities. Please contact [facility] on Monday,  to let them know that you were evaluated at the [facility] and recommended for residential substance abuse treatment. If you must have a referral, you may represent on Monday 01/16/23 for an evaluation for the clinical social worker make a referral to [facility].    Safety:   The following safety precautions should be taken:   No sharp objects. This includes scissors, razors, scrapers, and putty knives.   Chemicals should be removed and locked up.   Medications should be removed and locked up.   Weapons should be removed and locked up. This includes firearms, knives and instruments that can be used to cause injury.   The patient should abstain from use of illicit substances/drugs and abuse of any medications.  If symptoms worsen or do not continue to improve or if the patient becomes actively suicidal or homicidal then it is recommended that the patient return to the closest hospital emergency department, the [facility], or call 911 for further evaluation and treatment. National Suicide Prevention Lifeline 1-800-SUICIDE or (260)705-7473.  About 988 988 offers 24/7 access to trained crisis counselors who can help people experiencing mental health-related distress. People can call or text 988 or chat 988lifeline.org for themselves or if they are worried about a loved one who may need crisis support.

## 2023-01-13 NOTE — Progress Notes (Signed)
   01/13/23 1536  [facility] Triage Screening (Walk-ins at [facility] only)  How Did You Hear About [place]? Self  What Is the Reason for Your Visit/Call Today? Pt presents to [facility] voluntarily unaccompanied. Pt states that he started drinking because he was stressed out. Pt states that he hasn't really been sleeping and hasn't had anything to drink since Saturday. Pt denies SI, HI, AVH and alcohol/drug at this presents time. Pt states that he got approved for detox at [facility] in [place], but they said he needs a referral.  How Long Has This Been Causing You Problems? 1-6 months  Have You Recently Had Any Thoughts About Hurting Yourself? No  Are You Planning to Commit Suicide/Harm Yourself At This time? No  Have you Recently Had Thoughts About Hurting Someone Shawn Ramsey? No  Are You Planning To Harm Someone At This Time? No  Are you currently experiencing any auditory, visual or other hallucinations? No  Have You Used Any Alcohol or Drugs in the Past 24 Hours? No  Do you have any current medical co-morbidities that require immediate attention? No  Clinician description of patient physical appearance/behavior: casually dressed, calm, cooperative  What Do You Feel Would Help You the Most Today? Social Support;Alcohol or Drug Use Treatment  If access to [facility] was not available, would you have sought care in the Emergency Department? No  Determination of Need Routine (7 days)  Options For Referral Other: Comment (Referral for detox program)

## 2023-01-13 NOTE — ED Provider Notes (Signed)
Behavioral Health Urgent Care Medical Screening Exam  Patient Name: Shawn Ramsey MRN: [id number] Date of Evaluation: 01/13/23 Chief Complaint: referral to [facility] Diagnosis:  Final diagnoses:  Substance abuse (HCC)    History of Present illness: Shawn Ramsey is a 42 y.o. male patient with a past psychiatric history significant for MDD, insomnia, alcohol use disorder and a medical history of tracheostomy, diabetes, and COPD, who presents to the [facility] voluntary unaccompanied requesting substance abuse treatment and a referral to [facility].  Patient states that he was approved to go to [facility] for treatment but was told that he would need to detox and get a referral.   Patient reports drinking alcohol heavily every day for the past 3 months, on average every other day a half gallon of liquor. His last reported drink was on Saturday or Sunday. He denies alcohol withdrawal symptoms.  He denies a history of alcohol withdrawal seizures or DT's. He report past substance abuse treatment/rehab between 2004 or 2005.  He reports cocaine use. He states that he does not use much cocaine. He reports last using cocaine on "Saturday maybe." He is unable to recall how long he's been using cocaine.   He denies SI/HI/AVH. There is no objective evidence that the patient is responding to internal or external stimuli. He reports poor sleep. He reports good appetite. He denies feeling depressed and reports feeling stressed about his kids and different things.   He states that he resides alone. He states that he is unemployed and is on disability. He reports outpatient psychiatry at [facility]. He states that he is prescribed Zoloft but hasn't taken it in a couple days.   This provider consulted with Ava, care manager to discuss referral process to [facility]. Ava, contacted [facility] with this provider present, no answer and it appears to be after business hours. Furthermore,  patient does not meet criteria for inpatient detox as his last reported drink was 5 days ago. Patient denies withdrawal symptoms and does not appear to be in significant withdrawal. Patient provided with an AVS with recommendations for residential treatment.   Flowsheet Row ED from 01/13/2023 in [facility] ED from 12/27/2022 in [facility] Emergency Department at [facility] ED from 09/21/2022 in [facility] Emergency Department at [facility]  C-SSRS RISK CATEGORY No Risk No Risk No Risk       Psychiatric Specialty Exam  Presentation  General Appearance:Appropriate for Environment  Eye Contact:Fair  Speech:Clear and Coherent  Speech Volume:Normal  Handedness:Right   Mood and Affect  Mood:Euthymic  Affect:Congruent   Thought Process  Thought Processes:Coherent; Goal Directed  Descriptions of Associations:Intact  Orientation:None  Thought Content:WDL    Hallucinations:None  Ideas of Reference:None  Suicidal Thoughts:No  Homicidal Thoughts:No   Sensorium  Memory:Immediate Fair; Recent Fair; Remote Fair  Judgment:Fair  Insight:Fair   Executive Functions  Concentration:Fair  Attention Span:Fair  Recall:Fair  Fund of Knowledge:Fair  Language:Fair   Psychomotor Activity  Psychomotor Activity:Normal   Assets  Assets:Communication Skills; Desire for Improvement; Financial Resources/Insurance; Housing; Leisure Time; Physical Health; Social Support   Sleep  Sleep:Poor   Physical Exam: Physical Exam Cardiovascular:     Rate and Rhythm: Normal rate.  Pulmonary:     Effort: Pulmonary effort is normal.  Musculoskeletal:        General: Normal range of motion.  Neurological:     Mental Status: He is alert and oriented to person, place, and  time.    Review of Systems  Constitutional: Negative.   HENT: Negative.    Eyes: Negative.   Respiratory:         "Trachea"   Cardiovascular: Negative.    Gastrointestinal: Negative.   Genitourinary: Negative.   Musculoskeletal: Negative.   Neurological: Negative.   Endo/Heme/Allergies: Negative.    Blood pressure 123/74, pulse 74, temperature 98.6 F (37 C), temperature source Oral, resp. rate 17, SpO2 99%. There is no height or weight on file to calculate BMI.  Musculoskeletal: Strength & Muscle Tone: within normal limits Gait & Station: normal Patient leans: N/A   [facility] MSE Discharge Disposition for Follow up and Recommendations: Based on my evaluation the patient does not appear to have an emergency medical condition and can be discharged with resources and follow up care in outpatient services for substance abuse treatment   Discharge recommendations:   Substance abuse: Please review list of outpatient and residential treatment facilities. Please contact [facility] on Monday, to let them know that you were evaluated at the [facility] and recommended for residential substance abuse treatment. If you must have a referral, you may represent on Monday 01/16/23 for an evaluation for the clinical social worker make a referral to [facility].   Safety:   The following safety precautions should be taken:   No sharp objects. This includes scissors, razors, scrapers, and putty knives.   Chemicals should be removed and locked up.   Medications should be removed and locked up.   Weapons should be removed and locked up. This includes firearms, knives and instruments that can be used to cause injury.   The patient should abstain from use of illicit substances/drugs and abuse of any medications.  If symptoms worsen or do not continue to improve or if the patient becomes actively suicidal or homicidal then it is recommended that the patient return to the closest hospital emergency department, the [facility], or call 911 for further evaluation and treatment. National Suicide Prevention  Lifeline 1-800-SUICIDE or 725-601-2904.  About 988 988 offers 24/7 access to trained crisis counselors who can help people experiencing mental health-related distress. People can call or text 988 or chat 988lifeline.org for themselves or if they are worried about a loved one who may need crisis support.     Layla Barter, NP 01/13/2023, 5:29 PM

## 2023-01-13 NOTE — ED Notes (Signed)
Patient discharged home by provider. AVS given.

## 2023-01-21 ENCOUNTER — Other Ambulatory Visit: Payer: Self-pay | Admitting: Family Medicine

## 2023-01-21 DIAGNOSIS — E1169 Type 2 diabetes mellitus with other specified complication: Secondary | ICD-10-CM

## 2023-01-24 ENCOUNTER — Ambulatory Visit (HOSPITAL_BASED_OUTPATIENT_CLINIC_OR_DEPARTMENT_OTHER): Payer: Medicaid Other | Attending: Pulmonary Disease | Admitting: Pulmonary Disease

## 2023-01-24 DIAGNOSIS — G4733 Obstructive sleep apnea (adult) (pediatric): Secondary | ICD-10-CM | POA: Diagnosis present

## 2023-01-26 ENCOUNTER — Ambulatory Visit (HOSPITAL_COMMUNITY): Payer: Medicaid Other

## 2023-01-27 ENCOUNTER — Telehealth: Payer: Self-pay | Admitting: Pulmonary Disease

## 2023-01-27 DIAGNOSIS — G4733 Obstructive sleep apnea (adult) (pediatric): Secondary | ICD-10-CM

## 2023-01-27 NOTE — Telephone Encounter (Signed)
He did well on CPAP of 15 cm with tracheostomy capped during the titration study.  If he wants a replacement machine, we can send in a prescription for CPAP of 15 cm, with EPR 3 and large fullface mask  Please arrange routine follow-up visit in 8 weeks

## 2023-01-27 NOTE — Procedures (Signed)
Patient Name: Shawn Ramsey, Shawn Ramsey Date: 01/24/2023 Gender: Male D.O.B: [birth year] Age (years): 71 Referring Provider: Cyril Mourning MD, ABSM Height (inches): 65 Interpreting Physician: Cyril Mourning MD, ABSM Weight (lbs): 258 RPSGT: Armen Pickup BMI: 43 MRN: [id number] Neck Size: 19.00 <br> <br> CLINICAL INFORMATION The patient is referred for a CPAP titration to treat sleep apnea. s/p tracheostomy 01/2020, Study was performed with tracheostomy capped    PSG 09/05/12 >> AHI 119 Bipap titration 11/18/19 >> Bipap 24/19 with 3 liters oxygen.  Sub-optimal titration.  PSG with trach capped 07/23/21 >> AHI 10.2, SpO2 low 79%  SLEEP STUDY TECHNIQUE As per the AASM Manual for the Scoring of Sleep and Associated Events v2.3 (April 2016) with a hypopnea requiring 4% desaturations.  The channels recorded and monitored were frontal, central and occipital EEG, electrooculogram (EOG), submentalis EMG (chin), nasal and oral airflow, thoracic and abdominal wall motion, anterior tibialis EMG, snore microphone, electrocardiogram, and pulse oximetry. Continuous positive airway pressure (CPAP) was initiated at the beginning of the study and titrated to treat sleep-disordered breathing.  MEDICATIONS Medications self-administered by patient taken the night of the study : N/A  RESPIRATORY PARAMETERS Optimal PAP Pressure (cm): 15 AHI at Optimal Pressure (/hr): 0 Overall Minimal O2 (%): 86.0 Supine % at Optimal Pressure (%): 100 Minimal O2 at Optimal Pressure (%): 93.0   SLEEP ARCHITECTURE The study was initiated at 10:37:25 PM and ended at 4:51:33 AM.  Sleep onset time was 22.4 minutes and the sleep efficiency was 90.1%. The total sleep time was 337.2 minutes.  The patient spent 3.5% of the night in stage N1 sleep, 59.6% in stage N2 sleep, 0.0% in stage N3 and 36.9% in REM.Stage REM latency was 49.0 minutes  Wake after sleep onset was 14.5. Alpha intrusion was absent. Supine sleep was  56.13%.  CARDIAC DATA The 2 lead EKG demonstrated sinus rhythm. The mean heart rate was 76.0 beats per minute. Other EKG findings include: None.   LEG MOVEMENT DATA The total Periodic Limb Movements of Sleep (PLMS) were 0. The PLMS index was 0.0. A PLMS index of <15 is considered normal in adults.  IMPRESSIONS - The optimal PAP pressure was 15 cm of water. - Central sleep apnea was not noted during this titration (CAI = 0.4/h). - Moderate oxygen desaturations were observed during this titration (min O2 = 86.0%). - No snoring was audible during this study. - No cardiac abnormalities were observed during this study. - Clinically significant periodic limb movements were not noted during this study. Arousals associated with PLMs were rare.  DIAGNOSIS - Obstructive Sleep Apnea (G47.33)   ECOMMENDATIONS - Trial of CPAP therapy on 15 cm H2O with a Large size Resmed Full Face AirFit F20 mask and heated humidification. - Avoid alcohol, sedatives and other CNS depressants that may worsen sleep apnea and disrupt normal sleep architecture. - Sleep hygiene should be reviewed to assess factors that may improve sleep quality. - Weight management and regular exercise should be initiated or continued. - Return to Sleep Center for re-evaluation after 4 weeks of therapy  [Electronically signed] 01/27/2023 05:58 PM  Cyril Mourning MD, ABSM Diplomate, American Board of Sleep Medicine NPI: [id number]

## 2023-01-30 NOTE — Telephone Encounter (Signed)
Order placed and appt made for 2/5.

## 2023-02-08 ENCOUNTER — Telehealth: Payer: Self-pay | Admitting: Pulmonary Disease

## 2023-02-08 ENCOUNTER — Ambulatory Visit (HOSPITAL_COMMUNITY)
Admission: RE | Admit: 2023-02-08 | Discharge: 2023-02-08 | Disposition: A | Discharge status: Home / Self Care | Payer: Medicaid Other | Source: Ambulatory Visit | Attending: Acute Care | Admitting: Acute Care

## 2023-02-08 ENCOUNTER — Other Ambulatory Visit: Payer: Self-pay | Admitting: Acute Care

## 2023-02-08 DIAGNOSIS — J9611 Chronic respiratory failure with hypoxia: Secondary | ICD-10-CM | POA: Insufficient documentation

## 2023-02-08 DIAGNOSIS — J9610 Chronic respiratory failure, unspecified whether with hypoxia or hypercapnia: Secondary | ICD-10-CM | POA: Diagnosis present

## 2023-02-08 DIAGNOSIS — G4733 Obstructive sleep apnea (adult) (pediatric): Secondary | ICD-10-CM | POA: Diagnosis present

## 2023-02-08 DIAGNOSIS — Z930 Tracheostomy status: Secondary | ICD-10-CM | POA: Diagnosis present

## 2023-02-08 DIAGNOSIS — J209 Acute bronchitis, unspecified: Secondary | ICD-10-CM | POA: Diagnosis present

## 2023-02-08 DIAGNOSIS — E662 Morbid (severe) obesity with alveolar hypoventilation: Secondary | ICD-10-CM | POA: Diagnosis present

## 2023-02-08 MED ORDER — AZITHROMYCIN 250 MG PO TABS: ORAL_TABLET | ORAL | 0 refills | Status: AC

## 2023-02-08 NOTE — Progress Notes (Signed)
Reason for visit  Trach management/ possible decannulation   HPI Shawn Ramsey is well known to me. I follow him for trach management in the setting of MO w/ associated OHS and OSA. I saw him last in Aug. At that time he was still struggling w/ CPAP compliance. Had visit w/ Dr Vassie Loll in sept. Findings: still not compliant w/ CPAP (stating mask was leaking) Also note that his last walking oximetry sats dropped to 83%, oxygen was ordered thru [employer].  Had another CPAP titration study on 12/3 thru [facility] study showing optimal positive pressure 15cmH2O on 12/3 (resmed full face airfit mask w/ heated humidification). Presents today for trach change but also sick visit.  Reports developed scratchy sore throat ~3d ago, this has been associated w/ hot and cold flashes, fatigue, productive cough w/ clear colored mucus and increased shortness of breath. He said he new he was getting sick when he could no longer taste his black and mild cigarettes.  No obvious sick exposure.  Did have thick yellow tracheal secretions around trach cannula      Review of Systems  Constitutional:  Positive for chills, fever and malaise/fatigue.  HENT:  Positive for congestion, sinus pain and sore throat.   Eyes: Negative.   Respiratory:  Positive for cough, sputum production, shortness of breath and wheezing.   Cardiovascular: Negative.   Gastrointestinal: Negative.   Genitourinary: Negative.   Musculoskeletal: Negative.   Skin: Negative.   Neurological: Negative.   Endo/Heme/Allergies: Negative.     Exam  General obese 42 year old male who presents today reporting increased cough and SOB but also here for trach change HENT # 6 prox XLT stoma unremarkable w/ exception of yellow tracheal secretions around trach when removed from stoma. He does sound more congested than usual and actually looks like he feels poorly.  Pulm clear no wheezing does have rhonchus cough  Card rrr Abd soft Ext warm  Neuro baseline and  intact   Procedure Trach removed. Site inspected. Granulation tissue around stoma but otherwise unremarkable. New trach placed over obturator w/out difficulty. Placement verified via ETCO2   Impression/plan Principal Problem:   Tracheostomy status (HCC) Active Problems:   Morbid obesity due to excess calories (HCC)   OSA (obstructive sleep apnea)   Chronic respiratory failure (HCC)   Obesity hypoventilation syndrome (HCC)    OSA (obstructive sleep apnea) Overview: CPAP download shows his average AHI 0.7 with median CPAP 8 and 95 th percentile CPAP 10 cm H2O. Had repeat home titration study 12/3. Dr Vassie Loll recommending 15 cmH2O, heated w/ full facemask. Has it at home but has not started it  Plan [facility] at [facility]  He has f/u in Feb at sleep clinic. Will be sure to review Once he can be compliant with this the trach can come out.     Tracheostomy status (HCC) Overview: Followed at [facility].  Size 6 cuffless proximal XLT shiley Last change today 12/18 (previously 8/3)  Discussion His trach is stable and he is clear for decannulation IMO once he can prove he will use his CPAP regularly. He has not started using it again since he received his new CPAP mask  Plan ROV 12 weeks to assess compliance w/ CPAP and readiness for decannulation (I am doubtful that he will be) Cont routine trach care Cont routine trach change       Chronic respiratory failure with hypoxia (HCC) Overview: Had walking oximetry in Sept. Desaturated to low 80s or room  air. Oxygen ordered. Interestingly he is not wearing it today because he says says he does not have the portable device Plan I have instructed his mom to Call Dr Reginia Naas office     Acute purulent bronchitis Overview: Plan  Zpack sent to pharmacy      My time 32 min   Simonne Martinet ACNP-BC [facility] Pulmonary/Critical Care Pager # [phone] OR # [phone] if no answer

## 2023-02-08 NOTE — Progress Notes (Signed)
Tracheostomy Procedure Note  Shawn Ramsey [id number] 1980-10-31  Pre Procedure Tracheostomy Information  Trach Brand: Shiley Size:  #60XLTUP Style: Uncuffed Secured by: Sutures   Procedure: Trach change.    Post Procedure Tracheostomy Information  Trach Brand: Shiley Size:  #60XLTUP Style: Uncuffed Secured by: Sutures   Post Procedure Evaluation:  ETCO2 positive color change from yellow to purple : Yes.   Vital signs: stable. Patients current condition: stable Complications: No apparent complications Trach site exam: clean, dry Wound care done: dry, sterile, and 4 x 4 gauze Patient did tolerate procedure well.   Education: None  Prescription needs: None    Additional needs: New PMV given.

## 2023-02-08 NOTE — Telephone Encounter (Signed)
[name] mother states patient needs a portable oxygen concentrator. Patient would like to use [employer] [facility]. [name] phone number is [phone].

## 2023-02-16 NOTE — Telephone Encounter (Signed)
 I have sent an urgent message to [employer] to check on this issue

## 2023-02-16 NOTE — Telephone Encounter (Signed)
Called and spoke to pt's mother, Shawn Ramsey(DPR). She stated that pt is wanting POC. He currently has E tanks. Order was placed to [employer] 10/2022 for POC.  PCC's can you guys help with this?

## 2023-02-22 ENCOUNTER — Emergency Department (HOSPITAL_COMMUNITY)
Admission: EM | Admit: 2023-02-22 | Discharge: 2023-02-23 | Disposition: A | Discharge status: Home / Self Care | Payer: Medicaid Other | Attending: Emergency Medicine | Admitting: Emergency Medicine

## 2023-02-22 ENCOUNTER — Encounter (HOSPITAL_COMMUNITY): Payer: Self-pay

## 2023-02-22 ENCOUNTER — Other Ambulatory Visit: Payer: Self-pay

## 2023-02-22 DIAGNOSIS — N342 Other urethritis: Secondary | ICD-10-CM

## 2023-02-22 DIAGNOSIS — N341 Nonspecific urethritis: Secondary | ICD-10-CM | POA: Insufficient documentation

## 2023-02-22 DIAGNOSIS — R369 Urethral discharge, unspecified: Secondary | ICD-10-CM | POA: Diagnosis present

## 2023-02-22 DIAGNOSIS — Z794 Long term (current) use of insulin: Secondary | ICD-10-CM | POA: Diagnosis not present

## 2023-02-22 NOTE — ED Triage Notes (Signed)
 Pt reports he was told he was exposed to chlamydia.

## 2023-02-23 LAB — GC/CHLAMYDIA PROBE AMP (~~LOC~~) NOT AT ARMC
Chlamydia: NEGATIVE
Comment: NEGATIVE
Comment: NORMAL
Neisseria Gonorrhea: NEGATIVE

## 2023-02-23 MED ORDER — METRONIDAZOLE 500 MG PO TABS
500.0000 mg | ORAL_TABLET | Freq: Once | ORAL | Status: AC
  Administered 2023-02-23: 500 mg via ORAL
  Filled 2023-02-23: qty 1

## 2023-02-23 MED ORDER — DOXYCYCLINE HYCLATE 100 MG PO CAPS
100.0000 mg | ORAL_CAPSULE | Freq: Two times a day (BID) | ORAL | 0 refills | Status: DC
Start: 2023-02-23 — End: 2023-03-17

## 2023-02-23 MED ORDER — CEFTRIAXONE SODIUM 500 MG IJ SOLR
500.0000 mg | Freq: Once | INTRAMUSCULAR | Status: AC
Start: 2023-02-23 — End: 2023-02-23
  Administered 2023-02-23: 500 mg via INTRAMUSCULAR
  Filled 2023-02-23: qty 500

## 2023-02-23 MED ORDER — STERILE WATER FOR INJECTION IJ SOLN
INTRAMUSCULAR | Status: AC
  Administered 2023-02-23: 1 mL
  Filled 2023-02-23: qty 10

## 2023-02-23 MED ORDER — METRONIDAZOLE 500 MG PO TABS: 500.0000 mg | ORAL_TABLET | Freq: Two times a day (BID) | ORAL | 0 refills | Status: DC

## 2023-02-23 MED ORDER — DOXYCYCLINE HYCLATE 100 MG PO TABS
100.0000 mg | ORAL_TABLET | Freq: Once | ORAL | Status: AC
  Administered 2023-02-23: 100 mg via ORAL
  Filled 2023-02-23: qty 1

## 2023-02-23 NOTE — ED Notes (Signed)
 ED Provider at bedside.

## 2023-02-23 NOTE — ED Provider Notes (Signed)
 [place] EMERGENCY DEPARTMENT AT [facility] Provider Note   CSN: [id number] Arrival date & time: 02/22/23  1909     History  Chief Complaint  Patient presents with   Exposure to STD    Shawn Ramsey is a 43 y.o. male.  The history is provided by the patient.  Patient presents for concern for STD.  Patient reports that he was exposed to chlamydia and trichomonas.  Last sexual intercourse was around 4 days ago and it was unprotected.  He reports small amount of discharge, but no other complaints, no penile lesions.     Home Medications Prior to Admission medications   Medication Sig Start Date End Date Taking? Authorizing Provider  doxycycline  (VIBRAMYCIN ) 100 MG capsule Take 1 capsule (100 mg total) by mouth 2 (two) times daily. 02/23/23  Yes Midge Golas, MD  metroNIDAZOLE  (FLAGYL ) 500 MG tablet Take 1 tablet (500 mg total) by mouth 2 (two) times daily. One po bid x 7 days 02/23/23  Yes Midge Golas, MD  albuterol  (PROVENTIL ) (2.5 MG/3ML) 0.083% nebulizer solution Take 3 mLs (2.5 mg total) by nebulization every 6 (six) hours as needed for wheezing or shortness of breath. 04/22/21   Sood, Vineet, MD  albuterol  (VENTOLIN  HFA) 108 (90 Base) MCG/ACT inhaler Inhale 1-2 puffs into the lungs every 6 (six) hours as needed for wheezing or shortness of breath. 08/26/21   Antonetta Rollene BRAVO, MD  atorvastatin  (LIPITOR) 40 MG tablet Take 1 tablet by mouth once daily 01/04/23   Antonetta Rollene BRAVO, MD  Diclofenac  Sodium 3 % GEL Apply to left knee three times daily, as needed, for pain 05/04/22   Antonetta Rollene BRAVO, MD  melatonin 5 MG TABS Take 1 tablet (5 mg total) by mouth at bedtime. 07/15/20   Antonetta Rollene BRAVO, MD  MOUNJARO  7.5 MG/0.5ML Pen INJECT 7.5 MG  SUBCUTANEOUSLY ONCE A WEEK 01/23/23   Antonetta Rollene BRAVO, MD  pantoprazole  (PROTONIX ) 40 MG tablet Take 1 tablet (40 mg total) by mouth daily. 12/28/22   Haze Lonni PARAS, MD  potassium chloride  SA (KLOR-CON  M) 20 MEQ  tablet Take 1 tablet (20 mEq total) by mouth 2 (two) times daily. 03/23/22   Antonetta Rollene BRAVO, MD  PULMICORT  0.25 MG/2ML nebulizer solution Take 2 mLs (0.25 mg total) by nebulization in the morning and at bedtime. 04/22/21   Sood, Vineet, MD  sertraline  (ZOLOFT ) 100 MG tablet Take 2 tablets (200 mg total) by mouth daily. 01/11/23 04/11/23  Vickey Mettle, MD  sildenafil  (VIAGRA ) 100 MG tablet TAKE 1/2 TO 1 (ONE-HALF TO ONE) TABLET BY MOUTH ONCE DAILY AS NEEDED FOR  ERECTILE  DYSFUNCTION 10/25/22   Antonetta Rollene BRAVO, MD  sucralfate  (CARAFATE ) 1 GM/10ML suspension Take 10 mLs (1 g total) by mouth 4 (four) times daily -  with meals and at bedtime. 12/28/22   Haze Lonni PARAS, MD  torsemide  (DEMADEX ) 20 MG tablet Take 1 tablet (20 mg total) by mouth 2 (two) times daily. 03/11/22   Antonetta Rollene BRAVO, MD  Vitamin D , Ergocalciferol , (DRISDOL ) 1.25 MG (50000 UNIT) CAPS capsule Take 1 capsule (50,000 Units total) by mouth every 7 (seven) days. 02/16/22   Antonetta Rollene BRAVO, MD  zolpidem  (AMBIEN ) 5 MG tablet Take 1 tablet (5 mg total) by mouth at bedtime as needed for sleep. 01/06/22   Sood, Vineet, MD      Allergies    Patient has no known allergies.    Review of Systems   Review of Systems  Constitutional:  Negative for fever.  Genitourinary:  Positive for penile discharge. Negative for dysuria and genital sores.    Physical Exam Updated Vital Signs BP 138/80 (BP Location: Right Arm)   Pulse 100   Temp 99 F (37.2 C) (Oral)   Resp 18   Ht 1.651 m (5' 5)   Wt 117 kg   SpO2 97%   BMI 42.93 kg/m  Physical Exam CONSTITUTIONAL: Well developed/well nourished, chronically ill-appearing ABDOMEN: soft, obese GU no penile discharge, no penile lesions NEURO: Pt is awake/alert/appropriate, moves all extremitiesx4.  No facial droop.     ED Results / Procedures / Treatments   Labs (all labs ordered are listed, but only abnormal results are displayed) Labs Reviewed  GC/CHLAMYDIA PROBE AMP  ([place]) NOT AT [facility]    EKG None  Radiology No results found.  Procedures Procedures    Medications Ordered in ED Medications  cefTRIAXone  (ROCEPHIN ) injection 500 mg (500 mg Intramuscular Given 02/23/23 0112)  doxycycline  (VIBRA -TABS) tablet 100 mg (100 mg Oral Given 02/23/23 0112)  metroNIDAZOLE  (FLAGYL ) tablet 500 mg (500 mg Oral Given 02/23/23 0112)  sterile water  (preservative free) injection (1 mL  Given 02/23/23 0112)    ED Course/ Medical Decision Making/ A&P                                 Medical Decision Making Risk Prescription drug management.   Counseled on safe sex practices.  Antibiotics prescribed for exposure of chlamydia and trichomonas        Final Clinical Impression(s) / ED Diagnoses Final diagnoses:  Urethritis    Rx / DC Orders ED Discharge Orders          Ordered    doxycycline  (VIBRAMYCIN ) 100 MG capsule  2 times daily        02/23/23 0105    metroNIDAZOLE  (FLAGYL ) 500 MG tablet  2 times daily        02/23/23 0105              Midge Golas, MD 02/23/23 0124
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
## 2023-03-07 ENCOUNTER — Other Ambulatory Visit (HOSPITAL_COMMUNITY)
Admission: EM | Admit: 2023-03-07 | Discharge: 2023-03-07 | Discharge status: Against Medical Advice | Payer: No Typology Code available for payment source | Attending: Addiction Medicine | Admitting: Addiction Medicine

## 2023-03-07 ENCOUNTER — Ambulatory Visit (HOSPITAL_COMMUNITY)
Admission: EM | Admit: 2023-03-07 | Discharge: 2023-03-07 | Disposition: A | Discharge status: Home / Self Care | Payer: No Typology Code available for payment source

## 2023-03-07 DIAGNOSIS — R4589 Other symptoms and signs involving emotional state: Secondary | ICD-10-CM

## 2023-03-07 DIAGNOSIS — Z560 Unemployment, unspecified: Secondary | ICD-10-CM | POA: Insufficient documentation

## 2023-03-07 DIAGNOSIS — F418 Other specified anxiety disorders: Secondary | ICD-10-CM | POA: Insufficient documentation

## 2023-03-07 DIAGNOSIS — F1910 Other psychoactive substance abuse, uncomplicated: Secondary | ICD-10-CM | POA: Diagnosis present

## 2023-03-07 DIAGNOSIS — G4733 Obstructive sleep apnea (adult) (pediatric): Secondary | ICD-10-CM | POA: Insufficient documentation

## 2023-03-07 DIAGNOSIS — F1010 Alcohol abuse, uncomplicated: Secondary | ICD-10-CM | POA: Diagnosis not present

## 2023-03-07 DIAGNOSIS — Z930 Tracheostomy status: Secondary | ICD-10-CM | POA: Insufficient documentation

## 2023-03-07 DIAGNOSIS — F1410 Cocaine abuse, uncomplicated: Secondary | ICD-10-CM | POA: Insufficient documentation

## 2023-03-07 MED ORDER — OLANZAPINE 5 MG PO TBDP: 5.0000 mg | ORAL_TABLET | Freq: Three times a day (TID) | ORAL | Status: DC | PRN

## 2023-03-07 MED ORDER — OLANZAPINE 10 MG IM SOLR: 5.0000 mg | Freq: Three times a day (TID) | INTRAMUSCULAR | Status: DC | PRN

## 2023-03-07 MED ORDER — ACETAMINOPHEN 325 MG PO TABS: 650.0000 mg | ORAL_TABLET | Freq: Four times a day (QID) | ORAL | Status: DC | PRN

## 2023-03-07 MED ORDER — MAGNESIUM HYDROXIDE 400 MG/5ML PO SUSP: 30.0000 mL | Freq: Every day | ORAL | Status: DC | PRN

## 2023-03-07 MED ORDER — ALUM & MAG HYDROXIDE-SIMETH 200-200-20 MG/5ML PO SUSP: 30.0000 mL | ORAL | Status: DC | PRN

## 2023-03-07 MED ORDER — OLANZAPINE 10 MG IM SOLR: 10.0000 mg | Freq: Three times a day (TID) | INTRAMUSCULAR | Status: DC | PRN

## 2023-03-07 NOTE — BH Assessment (Addendum)
 Comprehensive Clinical Assessment (CCA) Note  03/07/2023 Shawn Ramsey [id number]  Disposition: Gaither Pouch, NP, recommends [facility] admission.   The patient demonstrates the following risk factors for suicide: Chronic risk factors for suicide include: psychiatric disorder of depression and anxiety, substance use disorder, and medical illness unknown . Acute risk factors for suicide include: family or marital conflict and social withdrawal/isolation. Protective factors for this patient include: responsibility to others (children, family) and hope for the future. Considering these factors, the overall suicide risk at this point appears to be moderate. Patient is not appropriate for outpatient follow up.  Shawn Ramsey is a 43 year old male presenting as a voluntary walk-in to [facility] due to alcohol detox. Patient denied SI, HI and psychosis. Patient reports history of anxiety and depression. When asked, what is the reason you came in tonight, patient stated I needed to get away for awhile. Patient reports drinking half gallon of alcohol daily. Patient started drinking alcohol at the age of 57. Patient reports using cocaine 4-5x weekly, amounts unknown. Patient reports worsening depressive symptoms. Patient reports poor sleep and normal appetite.   Patient is currently being seen by Shawn Ramsey for medication management and is prescribed Zoloft  as needed. Patient denied prior psych hospitalizations, suicide attempts and self-harming behaviors.   Patient resides with parents. Patient reports having 5 children (23, 17, 16, 8 and 1). Patient is on disability for mental health since 2008. Patient denied access to guns. Patient was calm and cooperative during assessment. Patient seeking detox treatment.   Chief Complaint:  Chief Complaint  Patient presents with   Addiction Problem   Visit Diagnosis:  Alcohol Abuse    CCA Screening, Triage and Referral (STR)  Patient Reported Information How did  you hear about us ? Family/Friend  What Is the Reason for Your Visit/Call Today? Shawn Ramsey is a 43 year old male presenting as a voluntary walk-in to [facility] due to alcohol detox. Patient denied SI, HI and psychosis. Patient reports drinking half gallon of alcohol daily. Patient started drinking alcohol at the age of 74. Patient reports using cocaine 4-5x weekly, amounts unknown. Patient is currently being seen by Shawn Ramsey for medication management and is prescribed Zoloft  as needed.  How Long Has This Been Causing You Problems? 1-6 months  What Do You Feel Would Help You the Most Today? Treatment for Depression or other mood problem   Have You Recently Had Any Thoughts About Hurting Yourself? No  Are You Planning to Commit Suicide/Harm Yourself At This time? No   Flowsheet Row ED from 03/07/2023 in [facility] ED from 02/22/2023 in [facility] Emergency Department at [facility] ED from 01/13/2023 in [facility]  C-SSRS RISK CATEGORY No Risk No Risk No Risk       Have you Recently Had Thoughts About Hurting Someone Shawn Ramsey? No  Are You Planning to Harm Someone at This Time? No  Explanation: n/a   Have You Used Any Alcohol or Drugs in the Past 24 Hours? Yes  How Long Ago Did You Use Drugs or Alcohol? yesterday morning  What Did You Use and How Much? alcohol and cocaine, aounts unknown   Do You Currently Have a Therapist/Psychiatrist? Yes  Name of Therapist/Psychiatrist: Name of Therapist/Psychiatrist: Dr. Lisette   Have You Been Recently Discharged From Any Office Practice or Programs? No  Explanation of Discharge From Practice/Program: n/a    CCA Screening Triage Referral Assessment Type of Contact: Face-to-Face  Telemedicine  Service Delivery:  n/a Is this Initial or Reassessment?  N/a Date Telepsych consult ordered in [facility]:  N/a  Time Telepsych consult ordered in [facility]:   N/a Location of Assessment: [facility] Assessment Services  Provider Location: [facility] Assessment Services   Collateral Involvement: none reported   Does Patient Have a Automotive Engineer Guardian? No  Legal Guardian Contact Information: n/a  Copy of Legal Guardianship Form: -- (n/a)  Legal Guardian Notified of Arrival: -- (n/a)  Legal Guardian Notified of Pending Discharge: -- (n/a)  If Minor and Not Living with Parent(s), Who has Custody? n/a  Is CPS involved or ever been involved? Never  Is APS involved or ever been involved? Never   Patient Determined To Be At Risk for Harm To Self or Others Based on Review of Patient Reported Information or Presenting Complaint? No  Method: No Plan  Availability of Means: No access or NA  Intent: Vague intent or NA  Notification Required: No need or identified person  Additional Information for Danger to Others Potential: -- (completed)  Additional Comments for Danger to Others Potential: n/a  Are There Guns or Other Weapons in Your Home? No  Types of Guns/Weapons: n/a  Are These Weapons Safely Secured?                            -- (n/a)  Who Could Verify You Are Able To Have These Secured: n/a  Do You Have any Outstanding Charges, Pending Court Dates, Parole/Probation? none reported  Contacted To Inform of Risk of Harm To Self or Others: Family/Significant Other:    Does Patient Present under Involuntary Commitment? No    [place] of Residence: [place]   Patient Currently Receiving the Following Services: Medication Management   Determination of Need: Urgent (48 hours)   Options For Referral: Facility-Based Crisis; Medication Management; Outpatient Therapy     CCA Biopsychosocial Patient Reported Schizophrenia/Schizoaffective Diagnosis in Past: No   Strengths: self-awareness   Mental Health Symptoms Depression:  Hopelessness; Sleep (too much or little); Difficulty Concentrating; Fatigue   Duration of Depressive symptoms: Duration  of Depressive Symptoms: Less than two weeks   Mania:  None   Anxiety:   Worrying; Tension; Sleep   Psychosis:  None   Duration of Psychotic symptoms:    Trauma:  None   Obsessions:  None   Compulsions:  None   Inattention:  None   Hyperactivity/Impulsivity:  None   Oppositional/Defiant Behaviors:  None   Emotional Irregularity:  None   Other Mood/Personality Symptoms:  n/a    Mental Status Exam Appearance and self-care  Stature:  Average   Weight:  Average weight   Clothing:  Age-appropriate   Grooming:  Normal   Cosmetic use:  None   Posture/gait:  Normal   Motor activity:  Not Remarkable   Sensorium  Attention:  Normal   Concentration:  Normal   Orientation:  X5   Recall/memory:  Normal   Affect and Mood  Affect:  Appropriate   Mood:  Depressed   Relating  Eye contact:  Normal   Facial expression:  Sad   Attitude toward examiner:  Cooperative   Thought and Language  Speech flow: Normal   Thought content:  Appropriate to Mood and Circumstances   Preoccupation:  None   Hallucinations:  None   Organization:  Coherent   [employer] of Knowledge:  Average   Intelligence:  Average  Abstraction:  Normal   Judgement:  Normal   Reality Testing:  Adequate   Insight:  Fair   Decision Making:  Normal   Social Functioning  Social Maturity:  Isolates   Social Judgement:  Heedless   Stress  Stressors:  Relationship   Coping Ability:  Human Resources Officer Deficits:  Chief Operating Officer; Communication   Supports:  Family     Religion: Religion/Spirituality Are You A Religious Person?: Yes How Might This Affect Treatment?: none  Leisure/Recreation: Leisure / Recreation Do You Have Hobbies?: Yes Leisure and Hobbies: watching tv  Exercise/Diet: Exercise/Diet Do You Exercise?: No Have You Gained or Lost A Significant Amount of Weight in the Past Six Months?: No Do You Follow a Special Diet?:  No Do You Have Any Trouble Sleeping?: Yes Explanation of Sleeping Difficulties: poor   CCA Employment/Education Employment/Work Situation: Employment / Work Situation Employment Situation: On disability Why is Patient on Disability: mental health How Long has Patient Been on Disability: 2008 Patient's Job has Been Impacted by Current Illness: No Has Patient ever Been in the [employer]?: No  Education: Education Is Patient Currently Attending School?: No Last Grade Completed: 11 Did You Attend College?: No Did You Have An Individualized Education Program (IIEP): No Did You Have Any Difficulty At School?: No Patient's Education Has Been Impacted by Current Illness: No   CCA Family/Childhood History Family and Relationship History: Family history Marital status: Single Does patient have children?: Yes How many children?: 5 How is patient's relationship with their children?: poor I don't see them  Childhood History:  Childhood History By whom was/is the patient raised?: Both parents Did patient suffer any verbal/emotional/physical/sexual abuse as a child?: No Did patient suffer from severe childhood neglect?: No Has patient ever been sexually abused/assaulted/raped as an adolescent or adult?: No Was the patient ever a victim of a crime or a disaster?: No Witnessed domestic violence?: No Has patient been affected by domestic violence as an adult?: No       CCA Substance Use Alcohol/Drug Use: Alcohol / Drug Use Pain Medications: see MAR Prescriptions: see MAR Over the Counter: see MAR History of alcohol / drug use?: Yes Longest period of sobriety (when/how long): n/a Negative Consequences of Use: Personal relationships, Work / Programmer, Multimedia Withdrawal Symptoms: None Substance #1 Name of Substance 1: alcohol 1 - Age of First Use: 14 1 - Amount (size/oz): half gallon 1 - Frequency: daily 1 - Method of Aquiring: store purchase Substance #2 Name of Substance 2:  cocaine 2 - Age of First Use: unknown 2 - Amount (size/oz): unknown 2 - Frequency: 4-5x weekly 2 - Method of Aquiring: unable to assess      ASAM's:  Six Dimensions of Multidimensional Assessment  Dimension 1:  Acute Intoxication and/or Withdrawal Potential:   Dimension 1:  Description of individual's past and current experiences of substance use and withdrawal: Current usage  Dimension 2:  Biomedical Conditions and Complications:   Dimension 2:  Description of patient's biomedical conditions and  complications: Medical history.  Dimension 3:  Emotional, Behavioral, or Cognitive Conditions and Complications:  Dimension 3:  Description of emotional, behavioral, or cognitive conditions and complications: Anxiety and depression.  Dimension 4:  Readiness to Change:  Dimension 4:  Description of Readiness to Change criteria: Seeking detox treatment.  Dimension 5:  Relapse, Continued use, or Continued Problem Potential:  Dimension 5:  Relapse, continued use, or continued problem potential critiera description: Continued usage.  Dimension 6:  Recovery/Living Environment:  Dimension 6:  Recovery/Iiving environment criteria description: Resides with parents, positive environment.  ASAM Severity Score:    ASAM Recommended Level of Treatment: ASAM Recommended Level of Treatment: Level III Residential Treatment   Substance use Disorder (SUD) Substance Use Disorder (SUD)  Checklist Symptoms of Substance Use: Evidence of tolerance, Continued use despite having a persistent/recurrent physical/psychological problem caused/exacerbated by use, Continued use despite persistent or recurrent social, interpersonal problems, caused or exacerbated by use, Substance(s) often taken in larger amounts or over longer times than was intended, Social, occupational, recreational activities given up or reduced due to use, Recurrent use that results in a failure to fulfill major role obligations (work, school, home),  Persistent desire or unsuccessful efforts to cut down or control use, Presence of craving or strong urge to use, Large amounts of time spent to obtain, use or recover from the substance(s)  Recommendations for Services/Supports/Treatments: Recommendations for Services/Supports/Treatments Recommendations For Services/Supports/Treatments: Medication Management, Detox, Facility Based Crisis, Individual Therapy  Disposition Recommendation per psychiatric provider:  [facility] inpatient   DSM5 Diagnoses: Patient Active Problem List   Diagnosis Date Noted   Substance abuse (HCC) 03/07/2023   Acute purulent bronchitis 02/08/2023   Diabetes mellitus (HCC) 10/26/2022   Upper respiratory infection 09/15/2022   Osteoarthritis of left knee 05/09/2022   Excess skin 04/18/2022   Encounter for examination following treatment at hospital 03/27/2022   Annual visit for general adult medical examination with abnormal findings 02/16/2022   Cellulitis 11/21/2021   Hyperlipidemia associated with type 2 diabetes mellitus (HCC) 10/04/2021   Insomnia 07/11/2021   Depression, major, single episode, moderate (HCC) 07/11/2021   Gallbladder polyp 06/24/2021   Hepatic hemangioma 06/24/2021   ED (erectile dysfunction) 04/20/2021   Low back pain with left-sided sciatica 12/15/2020   Abnormal liver ultrasound 08/16/2020   Abnormal gallbladder ultrasound 08/16/2020   Migraine 08/05/2020   Hepatic lesion 07/15/2020   Chronic diastolic heart failure (HCC) 07/15/2020   Obesity hypoventilation syndrome (HCC)    Difficulty demonstrating health literacy    Colon cancer screening 03/23/2020   Leg edema 03/23/2020   Copious oral secretions    Tracheostomy status (HCC)    Tracheomalacia    Alcohol consumption binge drinking 04/10/2019   Chronic respiratory failure (HCC) 11/14/2018   Hypoxia 09/24/2018   Dyspnea and respiratory abnormalities 09/24/2018   Nocturnal hypoxia 04/15/2018   Vitamin D  deficiency 09/15/2017    Cigarette nicotine  dependence 05/15/2016   Essential hypertension 02/04/2016   Chronic pain 12/08/2015   Chronic venous insufficiency 04/17/2015   Left knee pain 11/03/2014   Type 2 diabetes mellitus with obesity (HCC) 11/03/2014   Dyslipidemia 11/03/2014   Tracheobronchitis 06/26/2014   OSA (obstructive sleep apnea) 03/15/2014   Metabolic syndrome X 09/16/2012   Allergic rhinitis 09/13/2012   Morbid obesity due to excess calories (HCC) 09/08/2008   Cough variant asthma 09/08/2008     Referrals to Alternative Service(s): Referred to Alternative Service(s):   Place:   Date:   Time:    Referred to Alternative Service(s):   Place:   Date:   Time:    Referred to Alternative Service(s):   Place:   Date:   Time:    Referred to Alternative Service(s):   Place:   Date:   Time:     [place] JONETTA Childes, [facility]
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
## 2023-03-07 NOTE — ED Provider Notes (Signed)
 Facility Based Crisis Admission H&P  Date: 03/07/23 Patient Name: Shawn Ramsey MRN: [id number] Chief Complaint: need rehab from cocaine  Diagnoses:  Final diagnoses:  Substance abuse (HCC)  Alcohol abuse  Anxious appearance    HPI: Shawn Ramsey, 43 y/o male with a history of substance abuse, MDD and alcohol abuse, OSA with trach.  Presented to [facility] voluntarily.  Per the patient he is trying to get detox from alcohol and cocaine abuse.  According to the patient he drink half a gallon of alcohol every other day and he used cocaine daily.  Patient reports he is currently unemployed and is on disability, patient does appear to be nonchalant even when talking to him does not really seem to take things seriously giggles and laughs when you ask him a question.  Face-to-face observation of patient, patient is alert and oriented x 4, speech is clear, maintaining eye contact.  Patient dressed appropriate for the weather.  Patient denies SI, HI, AVH or paranoia.  Reports he drinks half a gallon of alcohol every other day and also reports he has been using cocaine daily.  Patient denies access to guns did not want to hurt himself or others.  I review of patient records show patient has been here for similar situations before.   PGQ9 completed pt score 3 (minimal depression). Recommend FBC  PHQ 2-9:  Flowsheet Row ED from 03/07/2023 in [facility] Office Visit from 09/15/2022 in [facility] Office Visit from 11/19/2021 in [facility]  Thoughts that you would be better off dead, or of hurting yourself in some way Not at all Not at all Not at all  PHQ-9 Total Score 3 8 0       Flowsheet Row ED from 03/07/2023 in [facility] ED from 02/22/2023 in [facility] Emergency Department at [facility] ED from 01/13/2023 in [facility]  C-SSRS RISK CATEGORY No Risk No  Risk No Risk         Total Time spent with patient: 15 minutes  Musculoskeletal  Strength & Muscle Tone: within normal limits Gait & Station: normal Patient leans: N/A  Psychiatric Specialty Exam  Presentation General Appearance:  Casual  Eye Contact: Good  Speech: Clear and Coherent  Speech Volume: Normal  Handedness: Right   Mood and Affect  Mood: Euthymic  Affect: Congruent   Thought Process  Thought Processes: Coherent  Descriptions of Associations:Intact  Orientation:Full (Time, Place and Person)  Thought Content:WDL    Hallucinations:Hallucinations: None  Ideas of Reference:None  Suicidal Thoughts:Suicidal Thoughts: No  Homicidal Thoughts:Homicidal Thoughts: No   Sensorium  Memory: Immediate Fair  Judgment: Fair  Insight: Fair   Art Therapist  Concentration: Fair  Attention Span: Fair  Recall: Fair  Fund of Knowledge: Fair  Language: Fair   Psychomotor Activity  Psychomotor Activity: Psychomotor Activity: Normal   Assets  Assets: Desire for Improvement; Resilience   Sleep  Sleep: Sleep: Fair Number of Hours of Sleep: 6   Nutritional Assessment (For OBS and FBC admissions only) Has the patient had a weight loss or gain of 10 pounds or more in the last 3 months?: No Has the patient had a decrease in food intake/or appetite?: No Does the patient have dental problems?: No Does the patient have eating habits or behaviors that may be indicators of an eating disorder including binging or inducing vomiting?: No Has the patient recently lost weight without  trying?: 0 Has the patient been eating poorly because of a decreased appetite?: 0 Malnutrition Screening Tool Score: 0    Physical Exam HENT:     Head: Normocephalic.     Nose: Nose normal.  Eyes:     Pupils: Pupils are equal, round, and reactive to light.  Cardiovascular:     Rate and Rhythm: Normal rate.  Pulmonary:     Effort: Pulmonary  effort is normal.  Musculoskeletal:        General: Normal range of motion.     Cervical back: Normal range of motion.  Neurological:     General: No focal deficit present.     Mental Status: He is alert.  Psychiatric:        Mood and Affect: Mood normal.        Behavior: Behavior normal.        Thought Content: Thought content normal.        Judgment: Judgment normal.    Review of Systems  Constitutional: Negative.   HENT: Negative.    Eyes: Negative.   Respiratory: Negative.    Cardiovascular: Negative.   Gastrointestinal: Negative.   Genitourinary: Negative.   Musculoskeletal: Negative.   Skin: Negative.   Neurological: Negative.   Psychiatric/Behavioral:  Positive for depression and substance abuse. The patient is nervous/anxious.     There were no vitals taken for this visit. There is no height or weight on file to calculate BMI.  Past Psychiatric History: alcohol abuse, substance abuse,  mDD   Is the patient at risk to self? No  Has the patient been a risk to self in the past 6 months? No .    Has the patient been a risk to self within the distant past? No   Is the patient a risk to others? No   Has the patient been a risk to others in the past 6 months? No   Has the patient been a risk to others within the distant past? No   Past Medical History: see chart   Family History: See H&P Social History: cocaine and alcohol   Last Labs:  Admission on 02/22/2023, Discharged on 02/23/2023  Component Date Value Ref Range Status   Neisseria Gonorrhea 02/22/2023 Negative   Final   Chlamydia 02/22/2023 Negative   Final   Comment 02/22/2023 Normal Reference Ranger Chlamydia - Negative   Final   Comment 02/22/2023 Normal Reference Range Neisseria Gonorrhea - Negative   Final  Admission on 12/27/2022, Discharged on 12/28/2022  Component Date Value Ref Range Status   Sodium 12/27/2022 143  135 - 145 mmol/L Final   Potassium 12/27/2022 3.6  3.5 - 5.1 mmol/L Final   Chloride  12/27/2022 110  98 - 111 mmol/L Final   CO2 12/27/2022 25  22 - 32 mmol/L Final   Glucose, Bld 12/27/2022 111 (H)  70 - 99 mg/dL Final   Glucose reference range applies only to samples taken after fasting for at least 8 hours.   BUN 12/27/2022 15  6 - 20 mg/dL Final   Creatinine, Ser 12/27/2022 0.66  0.61 - 1.24 mg/dL Final   Calcium  12/27/2022 8.6 (L)  8.9 - 10.3 mg/dL Final   GFR, Estimated 12/27/2022 >60  >60 mL/min Final   Comment: (NOTE) Calculated using the CKD-EPI Creatinine Equation (2021)    Anion gap 12/27/2022 8  5 - 15 Final   Performed at [facility], [address] [place], [place] [address]   WBC 12/27/2022 6.4  4.0 -  10.5 K/uL Final   RBC 12/27/2022 4.63  4.22 - 5.81 MIL/uL Final   Hemoglobin 12/27/2022 14.3  13.0 - 17.0 g/dL Final   HCT 88/94/7975 42.0  39.0 - 52.0 % Final   MCV 12/27/2022 90.7  80.0 - 100.0 fL Final   MCH 12/27/2022 30.9  26.0 - 34.0 pg Final   MCHC 12/27/2022 34.0  30.0 - 36.0 g/dL Final   RDW 88/94/7975 16.2 (H)  11.5 - 15.5 % Final   Platelets 12/27/2022 325  150 - 400 K/uL Final   nRBC 12/27/2022 0.0  0.0 - 0.2 % Final   Performed at [facility], [address] [place], [place] [address]   Troponin I (High Sensitivity) 12/27/2022 5  <18 ng/L Final   Comment: (NOTE) Elevated high sensitivity troponin I (hsTnI) values and significant  changes across serial measurements may suggest ACS but many other  chronic and acute conditions are known to elevate hsTnI results.  Refer to the Links section for chest pain algorithms and additional  guidance. Performed at [facility], [address], [place], [place] [address]    Lipase 12/27/2022 51  11 - 51 U/L Final   Performed at [facility], [address], [place], [place] [address]   Color, Urine 12/27/2022 AMBER (A)  YELLOW Final   BIOCHEMICALS MAY BE AFFECTED BY COLOR   APPearance 12/27/2022 CLEAR  CLEAR Final   Specific Gravity, Urine 12/27/2022 1.028  1.005 - 1.030 Final   pH 12/27/2022 8.0   5.0 - 8.0 Final   Glucose, UA 12/27/2022 NEGATIVE  NEGATIVE mg/dL Final   Hgb urine dipstick 12/27/2022 NEGATIVE  NEGATIVE Final   Bilirubin Urine 12/27/2022 NEGATIVE  NEGATIVE Final   Ketones, ur 12/27/2022 NEGATIVE  NEGATIVE mg/dL Final   Protein, ur 88/94/7975 100 (A)  NEGATIVE mg/dL Final   Nitrite 88/94/7975 NEGATIVE  NEGATIVE Final   Leukocytes,Ua 12/27/2022 TRACE (A)  NEGATIVE Final   RBC / HPF 12/27/2022 0-5  0 - 5 RBC/hpf Final   WBC, UA 12/27/2022 0-5  0 - 5 WBC/hpf Final   Bacteria, UA 12/27/2022 NONE SEEN  NONE SEEN Final   Squamous Epithelial / HPF 12/27/2022 0-5  0 - 5 /HPF Final   Mucus 12/27/2022 PRESENT   Final   Performed at [facility], [address], [place], [place] [address]   Troponin I (High Sensitivity) 12/28/2022 5  <18 ng/L Final   Comment: (NOTE) Elevated high sensitivity troponin I (hsTnI) values and significant  changes across serial measurements may suggest ACS but many other  chronic and acute conditions are known to elevate hsTnI results.  Refer to the Links section for chest pain algorithms and additional  guidance. Performed at [facility], [address] [place], [place] [address]   Admission on 09/21/2022, Discharged on 09/21/2022  Component Date Value Ref Range Status   Neisseria Gonorrhea 09/21/2022 Negative   Final   Chlamydia 09/21/2022 Negative   Final   Comment 09/21/2022 Normal Reference Ranger Chlamydia - Negative   Final   Comment 09/21/2022 Normal Reference Range Neisseria Gonorrhea - Negative   Final    Allergies: Patient has no known allergies.  Medications:  PTA Medications  Medication Sig   melatonin 5 MG TABS Take 1 tablet (5 mg total) by mouth at bedtime.   albuterol  (PROVENTIL ) (2.5 MG/3ML) 0.083% nebulizer solution Take 3 mLs (2.5 mg total) by nebulization every 6 (six) hours as needed for wheezing or shortness of breath.   PULMICORT  0.25 MG/2ML nebulizer solution  Take 2 mLs (0.25 mg total) by nebulization in the morning  and at bedtime.   albuterol  (VENTOLIN  HFA) 108 (90 Base) MCG/ACT inhaler Inhale 1-2 puffs into the lungs every 6 (six) hours as needed for wheezing or shortness of breath.   zolpidem  (AMBIEN ) 5 MG tablet Take 1 tablet (5 mg total) by mouth at bedtime as needed for sleep.   Vitamin D , Ergocalciferol , (DRISDOL ) 1.25 MG (50000 UNIT) CAPS capsule Take 1 capsule (50,000 Units total) by mouth every 7 (seven) days.   torsemide  (DEMADEX ) 20 MG tablet Take 1 tablet (20 mg total) by mouth 2 (two) times daily.   potassium chloride  SA (KLOR-CON  M) 20 MEQ tablet Take 1 tablet (20 mEq total) by mouth 2 (two) times daily.   Diclofenac  Sodium 3 % GEL Apply to left knee three times daily, as needed, for pain   sildenafil  (VIAGRA ) 100 MG tablet TAKE 1/2 TO 1 (ONE-HALF TO ONE) TABLET BY MOUTH ONCE DAILY AS NEEDED FOR  ERECTILE  DYSFUNCTION   pantoprazole  (PROTONIX ) 40 MG tablet Take 1 tablet (40 mg total) by mouth daily.   sucralfate  (CARAFATE ) 1 GM/10ML suspension Take 10 mLs (1 g total) by mouth 4 (four) times daily -  with meals and at bedtime.   atorvastatin  (LIPITOR) 40 MG tablet Take 1 tablet by mouth once daily   sertraline  (ZOLOFT ) 100 MG tablet Take 2 tablets (200 mg total) by mouth daily.   MOUNJARO  7.5 MG/0.5ML Pen INJECT 7.5 MG  SUBCUTANEOUSLY ONCE A WEEK   doxycycline  (VIBRAMYCIN ) 100 MG capsule Take 1 capsule (100 mg total) by mouth 2 (two) times daily.   metroNIDAZOLE  (FLAGYL ) 500 MG tablet Take 1 tablet (500 mg total) by mouth 2 (two) times daily. One po bid x 7 days    Long Term Goals: Improvement in symptoms so as ready for discharge  Short Term Goals: Patient will verbalize feelings in meetings with treatment team members., Patient will attend at least of 50% of the groups daily., Pt will complete the PHQ9 on admission, day 3 and discharge., Patient will participate in completing the [place] Suicide Severity Rating Scale, Patient will score a low risk of violence for 24 hours prior to discharge,  and Patient will take medications as prescribed daily.  Medical Decision Making  Recommend FBC Lab Orders         SARS Coronavirus 2 by RT PCR (hospital order, performed in [facility] hospital lab) *cepheid single result test* Anterior Nasal Swab         CBC with Differential/Platelet         Comprehensive metabolic panel         Ethanol         TSH         POCT Urine Drug Screen - (I-Screen)      Meds ordered this encounter  Medications   acetaminophen  (TYLENOL ) tablet 650 mg   alum & mag hydroxide-simeth (MAALOX/MYLANTA) 200-200-20 MG/5ML suspension 30 mL   magnesium  hydroxide (MILK OF MAGNESIA) suspension 30 mL   OLANZapine  zydis (ZYPREXA ) disintegrating tablet 5 mg   OLANZapine  (ZYPREXA ) injection 5 mg   OLANZapine  (ZYPREXA ) injection 10 mg       Recommendations  Based on my evaluation the patient does not appear to have an emergency medical condition.  Gaither Pouch, NP 03/07/23  3:07 AM

## 2023-03-08 ENCOUNTER — Ambulatory Visit (HOSPITAL_COMMUNITY)
Admission: EM | Admit: 2023-03-08 | Discharge: 2023-03-08 | Disposition: A | Discharge status: Home / Self Care | Payer: No Typology Code available for payment source | Attending: Nurse Practitioner | Admitting: Nurse Practitioner

## 2023-03-08 DIAGNOSIS — E119 Type 2 diabetes mellitus without complications: Secondary | ICD-10-CM | POA: Insufficient documentation

## 2023-03-08 DIAGNOSIS — J449 Chronic obstructive pulmonary disease, unspecified: Secondary | ICD-10-CM | POA: Insufficient documentation

## 2023-03-08 DIAGNOSIS — Z5900 Homelessness unspecified: Secondary | ICD-10-CM | POA: Insufficient documentation

## 2023-03-08 DIAGNOSIS — F1910 Other psychoactive substance abuse, uncomplicated: Secondary | ICD-10-CM | POA: Insufficient documentation

## 2023-03-08 DIAGNOSIS — Z765 Malingerer [conscious simulation]: Secondary | ICD-10-CM | POA: Insufficient documentation

## 2023-03-08 DIAGNOSIS — F1090 Alcohol use, unspecified, uncomplicated: Secondary | ICD-10-CM | POA: Insufficient documentation

## 2023-03-08 DIAGNOSIS — F332 Major depressive disorder, recurrent severe without psychotic features: Secondary | ICD-10-CM | POA: Insufficient documentation
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
## 2023-03-08 NOTE — ED Provider Notes (Signed)
 Behavioral Health Urgent Care Medical Screening Exam  Patient Name: Shawn Ramsey MRN: [id number] Date of Evaluation: 03/08/23 Chief Complaint:  " Detox from alcohol and cocaine" Diagnosis:  Final diagnoses:  Malingering  Homelessness unspecified  Substance abuse (HCC)    History of Present illness: Shawn Ramsey is a 43 y.o. male. Shawn Ramsey is a 43 y.o. male patient with a past psychiatric history significant for MDD, insomnia, alcohol use disorder and a medical history of tracheostomy, diabetes, and COPD, who presents voluntarily to the [facility] unaccompanied requesting substance abuse treatment.    Patient was seen face to face by this provider, chart reviewed and consulted with Dr Cambronne. Per chart review, patient reported past substance abuse treatment/rehab between 2004 or 2005.  On evaluation, patient is alert, oriented x 4, and cooperative. Speech is clear, normal rate and coherent. Pt appears appropriately dressed. Eye contact is good. Mood is euthymic, affect is congruent with mood. Thought process is goal directed and coherent, and thought content is WDL. Pt denies SI/HI/AVH. There is no objective indication that the patient is responding to internal stimuli. No delusions elicited during this assessment.    On approach, patient appears calm, smiling and cooperative. Patient reports he is seeking detox from alcohol and some cocaine use. He reports drinking 1/2 gallon of alcohol every other day, and stated his last drink and cocaine use was a several days ago.  When asked for specifics, patient reports "the other morning". He denies alcohol/cocaine withdrawal symptoms. He denies a history of alcohol withdrawal seizures or DT's. Patient has no visible withdrawal symptoms, speech is clear, coherent and no evidence of impairment.  He is on disability and reports he has no permanent housing.  He is established with outpatient psychiatry at [facility] outpatient and prescribed  Zoloft .  Discussed recommendation for outpatient substance abuse treatment. Patient does not meet inpatient substance abuse treatment criteria. Patient is provided with several community resources for SAIOP and encouraged to follow up. Patient verbalizes his understanding and is in agreement.   Flowsheet Row ED from 03/08/2023 in [facility] ED from 03/07/2023 in [facility] ED from 02/22/2023 in [facility] Emergency Department at [facility]  C-SSRS RISK CATEGORY No Risk No Risk No Risk       Psychiatric Specialty Exam  Presentation  General Appearance:Appropriate for Environment  Eye Contact:Good  Speech:Clear and Coherent  Speech Volume:Normal  Handedness:Right   Mood and Affect  Mood: Euthymic  Affect: Congruent   Thought Process  Thought Processes: Goal Directed  Descriptions of Associations:Intact  Orientation:Full (Time, Place and Person)  Thought Content:WDL  Diagnosis of Schizophrenia or Schizoaffective disorder in past: No   Hallucinations:None  Ideas of Reference:None  Suicidal Thoughts:No  Homicidal Thoughts:No   Sensorium  Memory: Immediate Good  Judgment: Intact  Insight: Present   Executive Functions  Concentration: Good  Attention Span: Good  Recall: Fair  Fund of Knowledge: Fair  Language: Fair   Psychomotor Activity  Psychomotor Activity: Normal   Assets  Assets: Communication Skills; Desire for Improvement   Sleep  Sleep: Good  Number of hours:  6   Physical Exam: Physical Exam Constitutional:      Appearance: He is not toxic-appearing or diaphoretic.  HENT:     Nose: No congestion.  Eyes:     General:        Right eye: No discharge.        Left eye: No discharge.  Pulmonary:     Effort: No respiratory distress.  Chest:     Chest wall: No tenderness.  Neurological:     Mental Status: He is alert and oriented to person, place,  and time.  Psychiatric:        Attention and Perception: Attention and perception normal.        Mood and Affect: Mood and affect normal.        Speech: Speech normal.        Behavior: Behavior is cooperative.        Thought Content: Thought content normal.        Cognition and Memory: Cognition and memory normal.    Review of Systems  Constitutional:  Negative for chills, diaphoresis and fever.  HENT:  Negative for congestion.   Eyes:  Negative for discharge.  Respiratory:  Negative for cough, shortness of breath and wheezing.   Cardiovascular:  Negative for chest pain and palpitations.  Gastrointestinal:  Negative for diarrhea, nausea and vomiting.  Neurological:  Negative for dizziness, tingling, tremors, seizures, loss of consciousness and weakness.  Psychiatric/Behavioral:  Positive for substance abuse.    Blood pressure 121/79, pulse 100, temperature 98.1 F (36.7 C), temperature source Oral, resp. rate 20, SpO2 98%. There is no height or weight on file to calculate BMI.  Musculoskeletal: Strength & Muscle Tone: within normal limits Gait & Station: normal Patient leans: N/A   [facility] MSE Discharge Disposition for Follow up and Recommendations: Based on my evaluation the patient does not appear to have an emergency medical condition and can be discharged with resources and follow up care in outpatient services for Substance Abuse Intensive Outpatient Program  Recommend discharge and follow up with SAIOP. Resources provided.  Patient does not meet inpatient psychiatric admission criteria for substance abuse treatment at this time. Last drink as reported was several days ago There is no evidence of imminent risk of harm to self or others.  Discharge recommendations:  Please follow up with your primary care provider for all medical related needs.   Therapy: We recommend that patient participate in individual therapy to address mental health concerns.  Medications: The patient or  guardian is to contact a medical professional and/or outpatient provider to address any new side effects that develop. The patient or guardian should update outpatient providers of any new medications and/or medication changes.   Safety:  The patient should abstain from use of illicit substances/drugs and abuse of any medications. If symptoms worsen or do not continue to improve or if the patient becomes actively suicidal or homicidal then it is recommended that the patient return to the closest hospital emergency department, the [facility], or call 911 for further evaluation and treatment. National Suicide Prevention Lifeline 1-800-SUICIDE or 774-788-9139.  About 988 988 offers 24/7 access to trained crisis counselors who can help people experiencing mental health-related distress. People can call or text 988 or chat 988lifeline.org for themselves or if they are worried about a loved one who may need crisis support.  Crisis Mobile: [facility]:                     5741916460 (for crisis response 24 hours a day) [facility] Hotline:  503 668 4148   Patient is discharged in stable condition.   Sandralee Crow, NP 03/08/2023, 2:04 AM

## 2023-03-08 NOTE — Discharge Instructions (Signed)
   Discharge recommendations:  Please follow up with your primary care provider for all medical related needs.   Therapy: We recommend that patient participate in individual therapy to address mental health concerns.  Safety:  The patient should abstain from use of illicit substances/drugs and abuse of any medications. If symptoms worsen or do not continue to improve or if the patient becomes actively suicidal or homicidal then it is recommended that the patient return to the closest hospital emergency department, the [facility], or call 911 for further evaluation and treatment. National Suicide Prevention Lifeline 1-800-SUICIDE or 228-124-7274.  About 988 988 offers 24/7 access to trained crisis counselors who can help people experiencing mental health-related distress. People can call or text 988 or chat 988lifeline.org for themselves or if they are worried about a loved one who may need crisis support.  Crisis Mobile: [facility]:                     704 873 9258 (for crisis response 24 hours a day) [facility] Hotline:                                            (626)743-4063

## 2023-03-10 ENCOUNTER — Telehealth (INDEPENDENT_AMBULATORY_CARE_PROVIDER_SITE_OTHER): Payer: No Typology Code available for payment source | Admitting: Psychiatry

## 2023-03-10 ENCOUNTER — Encounter: Payer: Self-pay | Admitting: Psychiatry

## 2023-03-10 DIAGNOSIS — F330 Major depressive disorder, recurrent, mild: Secondary | ICD-10-CM

## 2023-03-10 DIAGNOSIS — F1490 Cocaine use, unspecified, uncomplicated: Secondary | ICD-10-CM

## 2023-03-10 DIAGNOSIS — F1020 Alcohol dependence, uncomplicated: Secondary | ICD-10-CM

## 2023-03-10 NOTE — Telephone Encounter (Signed)
I received a message from Shawn Ramsey with [employer]   Looks like this has already been taken care of i'm showing he received poc on 02/17/2023

## 2023-03-13 ENCOUNTER — Ambulatory Visit (INDEPENDENT_AMBULATORY_CARE_PROVIDER_SITE_OTHER): Payer: Medicaid Other | Admitting: Primary Care

## 2023-03-13 ENCOUNTER — Encounter: Payer: Self-pay | Admitting: Primary Care

## 2023-03-13 ENCOUNTER — Ambulatory Visit: Payer: Medicaid Other | Admitting: Primary Care

## 2023-03-13 VITALS — BP 129/85 | HR 81 | Ht 65.0 in | Wt 280.2 lb

## 2023-03-13 DIAGNOSIS — G4733 Obstructive sleep apnea (adult) (pediatric): Secondary | ICD-10-CM
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
## 2023-03-13 NOTE — Patient Instructions (Addendum)
  During your visit today, we discussed your severe sleep apnea, tracheostomy, weight management, and oxygen therapy. We reviewed your recent CPAP titration study and addressed your concerns about mask discomfort. We also talked about your desire to have your tracheostomy removed and your goals for weight loss.  YOUR PLAN:  -SEVERE SLEEP APNEA: Severe sleep apnea is a condition where you stop breathing multiple times during sleep. Your recent study showed that a CPAP pressure of 15 is optimal for you, but you have had trouble using the mask due to discomfort. We will order a new [employer] full face mask that goes under the nose and over the mouth to improve comfort. Additionally, you should purchase nasal pads to prevent irritation. It is crucial to use the CPAP every night to manage your sleep apnea and to consider removing your tracheostomy in the future.  -TRACHEOSTOMY: A tracheostomy is an opening created in your neck to help you breathe. You expressed a desire to have it removed, but this will only be considered if you consistently use your CPAP machine. We will continue with your current management plan and reassess at your next visit.  -WEIGHT MANAGEMENT: You are currently on Mounjaro injections to help with weight loss and have expressed a desire to lose more weight. Continue with your current weight management efforts and Mounjaro injections as prescribed.  -OXYGEN THERAPY: Walk test today showed you did not need supplemental oxygen. Use as needed to maintain O2 >88-90%  INSTRUCTIONS: - Please ensure you use your CPAP machine every night and follow the recommendations to improve comfort. Continue with your weight management plan and Mounjaro injections.   Follow-up Reschedule apt with Dr. Vassie Loll in February for sometime in March/April at [facility] location in [place] to reassess your progress and discuss the potential removal of your tracheostomy.
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
## 2023-03-16 ENCOUNTER — Encounter: Payer: Self-pay | Admitting: Primary Care

## 2023-03-17 ENCOUNTER — Encounter: Payer: Self-pay | Admitting: Family Medicine

## 2023-03-17 ENCOUNTER — Ambulatory Visit (INDEPENDENT_AMBULATORY_CARE_PROVIDER_SITE_OTHER): Payer: Medicaid Other | Admitting: Family Medicine

## 2023-03-17 VITALS — BP 120/70 | HR 88 | Ht 65.0 in | Wt 269.0 lb

## 2023-03-17 DIAGNOSIS — F1010 Alcohol abuse, uncomplicated: Secondary | ICD-10-CM

## 2023-03-17 DIAGNOSIS — N529 Male erectile dysfunction, unspecified: Secondary | ICD-10-CM

## 2023-03-17 DIAGNOSIS — F1990 Other psychoactive substance use, unspecified, uncomplicated: Secondary | ICD-10-CM

## 2023-03-17 DIAGNOSIS — E1169 Type 2 diabetes mellitus with other specified complication: Secondary | ICD-10-CM

## 2023-03-17 DIAGNOSIS — Z23 Encounter for immunization: Secondary | ICD-10-CM | POA: Diagnosis not present

## 2023-03-17 DIAGNOSIS — E559 Vitamin D deficiency, unspecified: Secondary | ICD-10-CM

## 2023-03-17 DIAGNOSIS — I5032 Chronic diastolic (congestive) heart failure: Secondary | ICD-10-CM

## 2023-03-17 DIAGNOSIS — I10 Essential (primary) hypertension: Secondary | ICD-10-CM

## 2023-03-17 DIAGNOSIS — R7302 Impaired glucose tolerance (oral): Secondary | ICD-10-CM

## 2023-03-17 DIAGNOSIS — E785 Hyperlipidemia, unspecified: Secondary | ICD-10-CM

## 2023-03-17 MED ORDER — TADALAFIL 20 MG PO TABS: 10.0000 mg | ORAL_TABLET | ORAL | 2 refills | Status: DC | PRN

## 2023-03-17 NOTE — Patient Instructions (Addendum)
Annual exam in office in 6 weeks, call if you need me sooner.  Flu vaccine in office today.  You are referred for diabetic footcare the office will call you to [facility] in [place].  Nurse will work on finding a facility for inpatient rehab for alcohol and drug addiction that will accept you with a tracheostomy.  It is important that you follow through with this as you are definitely aware that the addiction is getting the better of you now and you need to change behavior.  She will contact you via MyChart next week with updated information.  Labs today  CMP and EGFR HbA1c TSH vitamin D urine ACR. Lipid panel and vit d   Cialis is sent to your pharmacy.  It is important that you exercise regularly at least 30 minutes 5 times a week. If you develop chest pain, have severe difficulty breathing, or feel very tired, stop exercising immediately and seek medical attention    Thanks for choosing [facility], we consider it a privelige to serve you.

## 2023-03-17 NOTE — Assessment & Plan Note (Signed)
Diabetes associated with hyperlipidemia, obesity, vascular disease, and depression  Shawn Ramsey is reminded of the importance of commitment to daily physical activity for 30 minutes or more, as able and the need to limit carbohydrate intake to 30 to 60 grams per meal to help with blood sugar control.   The need to take medication as prescribed, test blood sugar as directed, and to call between visits if there is a concern that blood sugar is uncontrolled is also discussed.   Shawn Ramsey is reminded of the importance of daily foot exam, annual eye examination, and good blood sugar, blood pressure and cholesterol control.     Latest Ref Rng & Units 12/27/2022   10:39 PM 04/12/2022    3:36 PM 03/23/2022    2:34 PM 03/17/2022    9:06 PM 03/17/2022    3:15 PM  Diabetic Labs  HbA1c 4.8 - 5.6 %  5.8      Chol 100 - 199 mg/dL  147      HDL >82 mg/dL  40      Calc LDL 0 - 99 mg/dL  70      Triglycerides 0 - 149 mg/dL  72      Creatinine 9.56 - 1.24 mg/dL 2.13   0.86  5.78  4.69       03/17/2023    3:24 PM 03/13/2023    3:37 PM 03/08/2023    1:32 AM 03/07/2023    2:17 AM 02/23/2023    1:17 AM 02/22/2023    7:18 PM 02/22/2023    7:16 PM  BP/Weight  Systolic BP 120 129   138 130   Diastolic BP 70 85   80 88   Wt. (Lbs) 269.04 280.2     258  BMI 44.77 kg/m2 46.63 kg/m2     42.93 kg/m2     Information is confidential and restricted. Go to Review Flowsheets to unlock data.      03/17/2023    3:20 PM 04/20/2021   10:20 AM  Foot/eye exam completion dates  Foot Form Completion Done Done      Updated lab needed at/ before next visit.

## 2023-03-19 ENCOUNTER — Encounter: Payer: Self-pay | Admitting: Family Medicine

## 2023-03-19 LAB — CMP14+EGFR
ALT: 32 [IU]/L (ref 0–44)
AST: 18 [IU]/L (ref 0–40)
Albumin: 4.8 g/dL (ref 4.1–5.1)
Alkaline Phosphatase: 85 [IU]/L (ref 44–121)
BUN/Creatinine Ratio: 21 — ABNORMAL HIGH (ref 9–20)
BUN: 19 mg/dL (ref 6–24)
Bilirubin Total: <0.2 mg/dL (ref 0.0–1.2)
CO2: 26 mmol/L (ref 20–29)
Calcium: 9.5 mg/dL (ref 8.7–10.2)
Chloride: 100 mmol/L (ref 96–106)
Creatinine, Ser: 0.92 mg/dL (ref 0.76–1.27)
Globulin, Total: 2.8 g/dL (ref 1.5–4.5)
Glucose: 84 mg/dL (ref 70–99)
Potassium: 4.3 mmol/L (ref 3.5–5.2)
Sodium: 142 mmol/L (ref 134–144)
Total Protein: 7.6 g/dL (ref 6.0–8.5)
eGFR: 107 mL/min/{1.73_m2} (ref >59)

## 2023-03-19 LAB — LIPID PANEL
Chol/HDL Ratio: 4 {ratio} (ref 0.0–5.0)
Cholesterol, Total: 145 mg/dL (ref 100–199)
HDL: 36 mg/dL — ABNORMAL LOW (ref >39)
LDL Chol Calc (NIH): 86 mg/dL (ref 0–99)
Triglycerides: 130 mg/dL (ref 0–149)
VLDL Cholesterol Cal: 23 mg/dL (ref 5–40)

## 2023-03-19 LAB — HEMOGLOBIN A1C
Est. average glucose Bld gHb Est-mCnc: 108 mg/dL
Hgb A1c MFr Bld: 5.4 % (ref 4.8–5.6)

## 2023-03-19 LAB — TSH: TSH: 1.69 u[IU]/mL (ref 0.450–4.500)

## 2023-03-19 LAB — MICROALBUMIN / CREATININE URINE RATIO
Creatinine, Urine: 85.6 mg/dL
Microalb/Creat Ratio: 5 mg/g{creat} (ref 0–29)
Microalbumin, Urine: 4.1 ug/mL

## 2023-03-19 LAB — VITAMIN D 25 HYDROXY (VIT D DEFICIENCY, FRACTURES): Vit D, 25-Hydroxy: 56.9 ng/mL (ref 30.0–100.0)

## 2023-03-27 ENCOUNTER — Emergency Department (HOSPITAL_COMMUNITY)
Admission: EM | Admit: 2023-03-27 | Discharge: 2023-03-28 | Discharge status: Against Medical Advice | Payer: No Typology Code available for payment source | Attending: Emergency Medicine | Admitting: Emergency Medicine

## 2023-03-27 DIAGNOSIS — Z5321 Procedure and treatment not carried out due to patient leaving prior to being seen by health care provider: Secondary | ICD-10-CM | POA: Insufficient documentation

## 2023-03-27 DIAGNOSIS — F1423 Cocaine dependence with withdrawal: Secondary | ICD-10-CM | POA: Insufficient documentation

## 2023-03-28 NOTE — ED Triage Notes (Addendum)
Pt reports wants to detox from ETOH and cocaine use, last use was Sunday. Pt denies SI/ HI, but does endorse depression. Pt denies any other medical s/s at this time  Pt denies adverse effects with prior detoxs.

## 2023-03-29 ENCOUNTER — Encounter (HOSPITAL_COMMUNITY): Payer: Self-pay

## 2023-03-29 ENCOUNTER — Emergency Department (HOSPITAL_COMMUNITY)
Admission: EM | Admit: 2023-03-29 | Discharge: 2023-03-30 | Disposition: A | Discharge status: Home / Self Care | Payer: Medicaid Other | Attending: Emergency Medicine | Admitting: Emergency Medicine

## 2023-03-29 ENCOUNTER — Ambulatory Visit (HOSPITAL_BASED_OUTPATIENT_CLINIC_OR_DEPARTMENT_OTHER): Payer: Medicaid Other | Admitting: Pulmonary Disease

## 2023-03-29 ENCOUNTER — Other Ambulatory Visit: Payer: Self-pay

## 2023-03-29 DIAGNOSIS — J449 Chronic obstructive pulmonary disease, unspecified: Secondary | ICD-10-CM | POA: Diagnosis not present

## 2023-03-29 DIAGNOSIS — J45909 Unspecified asthma, uncomplicated: Secondary | ICD-10-CM | POA: Insufficient documentation

## 2023-03-29 DIAGNOSIS — E119 Type 2 diabetes mellitus without complications: Secondary | ICD-10-CM | POA: Diagnosis not present

## 2023-03-29 DIAGNOSIS — F17290 Nicotine dependence, other tobacco product, uncomplicated: Secondary | ICD-10-CM | POA: Insufficient documentation

## 2023-03-29 DIAGNOSIS — R109 Unspecified abdominal pain: Secondary | ICD-10-CM | POA: Diagnosis present

## 2023-03-29 DIAGNOSIS — Z8616 Personal history of COVID-19: Secondary | ICD-10-CM | POA: Insufficient documentation

## 2023-03-29 LAB — COMPREHENSIVE METABOLIC PANEL
ALT: 16 U/L (ref 0–44)
AST: 16 U/L (ref 15–41)
Albumin: 4 g/dL (ref 3.5–5.0)
Alkaline Phosphatase: 56 U/L (ref 38–126)
Anion gap: 10 (ref 5–15)
BUN: 14 mg/dL (ref 6–20)
CO2: 24 mmol/L (ref 22–32)
Calcium: 8.6 mg/dL — ABNORMAL LOW (ref 8.9–10.3)
Chloride: 106 mmol/L (ref 98–111)
Creatinine, Ser: 0.82 mg/dL (ref 0.61–1.24)
GFR, Estimated: >60 mL/min (ref >60)
Glucose, Bld: 110 mg/dL — ABNORMAL HIGH (ref 70–99)
Potassium: 3.1 mmol/L — ABNORMAL LOW (ref 3.5–5.1)
Sodium: 140 mmol/L (ref 135–145)
Total Bilirubin: 0.4 mg/dL (ref 0.0–1.2)
Total Protein: 7.1 g/dL (ref 6.5–8.1)

## 2023-03-29 LAB — CBC
HCT: 43.7 % (ref 39.0–52.0)
Hemoglobin: 14.9 g/dL (ref 13.0–17.0)
MCH: 30.5 pg (ref 26.0–34.0)
MCHC: 34.1 g/dL (ref 30.0–36.0)
MCV: 89.4 fL (ref 80.0–100.0)
Platelets: 320 10*3/uL (ref 150–400)
RBC: 4.89 MIL/uL (ref 4.22–5.81)
RDW: 13.9 % (ref 11.5–15.5)
WBC: 6.9 10*3/uL (ref 4.0–10.5)
nRBC: 0 % (ref 0.0–0.2)

## 2023-03-29 LAB — LIPASE, BLOOD: Lipase: 48 U/L (ref 11–51)

## 2023-03-29 NOTE — ED Triage Notes (Signed)
 Pt presents to ED from home with c/o abd pain that started 3 or 4 days ago.

## 2023-03-29 NOTE — Telephone Encounter (Unsigned)
 Copied from CRM [id number]. Topic: Referral - Question >> Mar 29, 2023 11:26 AM Nestora J wrote: Reason for CRM: Pts mother states he was referred to a foot doctor that is too far away and wants to know if he could referred to a foot doctor in [place] or [place] # [phone]

## 2023-03-30 LAB — URINALYSIS, ROUTINE W REFLEX MICROSCOPIC
Bilirubin Urine: NEGATIVE
Glucose, UA: NEGATIVE mg/dL
Hgb urine dipstick: NEGATIVE
Ketones, ur: NEGATIVE mg/dL
Leukocytes,Ua: NEGATIVE
Nitrite: NEGATIVE
Protein, ur: NEGATIVE mg/dL
Specific Gravity, Urine: 1.025 (ref 1.005–1.030)
pH: 5 (ref 5.0–8.0)

## 2023-03-30 MED ORDER — ONDANSETRON 4 MG PO TBDP: 4.0000 mg | ORAL_TABLET | Freq: Three times a day (TID) | ORAL | 0 refills | Status: DC | PRN

## 2023-03-30 MED ORDER — ALUM & MAG HYDROXIDE-SIMETH 200-200-20 MG/5ML PO SUSP
30.0000 mL | Freq: Once | ORAL | Status: AC
Start: 2023-03-30 — End: 2023-03-30
  Administered 2023-03-30: 30 mL via ORAL
  Filled 2023-03-30: qty 30

## 2023-03-30 MED ORDER — CEFTRIAXONE SODIUM 500 MG IJ SOLR
500.0000 mg | Freq: Once | INTRAMUSCULAR | Status: AC
  Administered 2023-03-30: 500 mg via INTRAMUSCULAR
  Filled 2023-03-30: qty 500

## 2023-03-30 MED ORDER — HYDROCORTISONE 1 % EX CREA: TOPICAL_CREAM | CUTANEOUS | 0 refills | Status: DC

## 2023-03-30 MED ORDER — ONDANSETRON 4 MG PO TBDP
4.0000 mg | ORAL_TABLET | Freq: Once | ORAL | Status: AC
  Administered 2023-03-30: 4 mg via ORAL
  Filled 2023-03-30: qty 1

## 2023-03-30 MED ORDER — DOXYCYCLINE HYCLATE 100 MG PO CAPS: 100.0000 mg | ORAL_CAPSULE | Freq: Two times a day (BID) | ORAL | 0 refills | Status: AC

## 2023-03-30 NOTE — Discharge Instructions (Signed)
 You were evaluated in the Emergency Department and after careful evaluation, we did not find any emergent condition requiring admission or further testing in the hospital.  Your exam/testing today is overall reassuring.  Take the Zofran  as needed for nausea, use the cream as needed for rectal pain, use the doxycycline  to treat for any possible STD and avoid sexual contact while on this medicine.  Please return to the Emergency Department if you experience any worsening of your condition.   Thank you for allowing us  to be a part of your care.
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
## 2023-03-31 ENCOUNTER — Ambulatory Visit: Payer: Medicaid Other | Admitting: Podiatry

## 2023-04-07 ENCOUNTER — Encounter (HOSPITAL_COMMUNITY): Payer: Self-pay | Admitting: Emergency Medicine

## 2023-04-07 ENCOUNTER — Emergency Department (HOSPITAL_COMMUNITY): Payer: Medicaid Other

## 2023-04-07 ENCOUNTER — Emergency Department (HOSPITAL_BASED_OUTPATIENT_CLINIC_OR_DEPARTMENT_OTHER): Payer: Medicaid Other

## 2023-04-07 ENCOUNTER — Other Ambulatory Visit: Payer: Self-pay

## 2023-04-07 ENCOUNTER — Emergency Department (HOSPITAL_COMMUNITY)
Admission: EM | Admit: 2023-04-07 | Discharge: 2023-04-07 | Disposition: A | Discharge status: Home / Self Care | Payer: Medicaid Other | Attending: Emergency Medicine | Admitting: Emergency Medicine

## 2023-04-07 DIAGNOSIS — I110 Hypertensive heart disease with heart failure: Secondary | ICD-10-CM | POA: Diagnosis not present

## 2023-04-07 DIAGNOSIS — E119 Type 2 diabetes mellitus without complications: Secondary | ICD-10-CM | POA: Insufficient documentation

## 2023-04-07 DIAGNOSIS — M25562 Pain in left knee: Secondary | ICD-10-CM | POA: Insufficient documentation

## 2023-04-07 DIAGNOSIS — Z8616 Personal history of COVID-19: Secondary | ICD-10-CM | POA: Insufficient documentation

## 2023-04-07 DIAGNOSIS — F17290 Nicotine dependence, other tobacco product, uncomplicated: Secondary | ICD-10-CM | POA: Diagnosis not present

## 2023-04-07 DIAGNOSIS — Z79899 Other long term (current) drug therapy: Secondary | ICD-10-CM | POA: Insufficient documentation

## 2023-04-07 DIAGNOSIS — M79605 Pain in left leg: Secondary | ICD-10-CM | POA: Insufficient documentation

## 2023-04-07 DIAGNOSIS — M79662 Pain in left lower leg: Secondary | ICD-10-CM

## 2023-04-07 DIAGNOSIS — Y900 Blood alcohol level of less than 20 mg/100 ml: Secondary | ICD-10-CM | POA: Insufficient documentation

## 2023-04-07 DIAGNOSIS — I5032 Chronic diastolic (congestive) heart failure: Secondary | ICD-10-CM | POA: Insufficient documentation

## 2023-04-07 DIAGNOSIS — Z85038 Personal history of other malignant neoplasm of large intestine: Secondary | ICD-10-CM | POA: Diagnosis not present

## 2023-04-07 DIAGNOSIS — Z7951 Long term (current) use of inhaled steroids: Secondary | ICD-10-CM | POA: Insufficient documentation

## 2023-04-07 DIAGNOSIS — F1910 Other psychoactive substance abuse, uncomplicated: Secondary | ICD-10-CM | POA: Diagnosis not present

## 2023-04-07 DIAGNOSIS — J449 Chronic obstructive pulmonary disease, unspecified: Secondary | ICD-10-CM | POA: Insufficient documentation

## 2023-04-07 LAB — RAPID URINE DRUG SCREEN, HOSP PERFORMED
Amphetamines: NOT DETECTED
Barbiturates: NOT DETECTED
Benzodiazepines: NOT DETECTED
Cocaine: POSITIVE — AB
Opiates: NOT DETECTED
Tetrahydrocannabinol: NOT DETECTED

## 2023-04-07 LAB — CBC
HCT: 38.6 % — ABNORMAL LOW (ref 39.0–52.0)
Hemoglobin: 12.8 g/dL — ABNORMAL LOW (ref 13.0–17.0)
MCH: 30.4 pg (ref 26.0–34.0)
MCHC: 33.2 g/dL (ref 30.0–36.0)
MCV: 91.7 fL (ref 80.0–100.0)
Platelets: 306 10*3/uL (ref 150–400)
RBC: 4.21 MIL/uL — ABNORMAL LOW (ref 4.22–5.81)
RDW: 14.9 % (ref 11.5–15.5)
WBC: 6.1 10*3/uL (ref 4.0–10.5)
nRBC: 0 % (ref 0.0–0.2)

## 2023-04-07 LAB — COMPREHENSIVE METABOLIC PANEL
ALT: 15 U/L (ref 0–44)
AST: 15 U/L (ref 15–41)
Albumin: 3.5 g/dL (ref 3.5–5.0)
Alkaline Phosphatase: 52 U/L (ref 38–126)
Anion gap: 10 (ref 5–15)
BUN: 11 mg/dL (ref 6–20)
CO2: 23 mmol/L (ref 22–32)
Calcium: 8.7 mg/dL — ABNORMAL LOW (ref 8.9–10.3)
Chloride: 107 mmol/L (ref 98–111)
Creatinine, Ser: 0.67 mg/dL (ref 0.61–1.24)
GFR, Estimated: >60 mL/min (ref >60)
Glucose, Bld: 92 mg/dL (ref 70–99)
Potassium: 3.9 mmol/L (ref 3.5–5.1)
Sodium: 140 mmol/L (ref 135–145)
Total Bilirubin: 0.4 mg/dL (ref 0.0–1.2)
Total Protein: 6.5 g/dL (ref 6.5–8.1)

## 2023-04-07 LAB — ETHANOL: Alcohol, Ethyl (B): <10 mg/dL (ref <10)

## 2023-04-07 NOTE — ED Triage Notes (Signed)
Patient requesting detox from cocaine and alcohol. Last used a couple hours ago. Patient states [facility] to [facility] but they stated they could not take him because he has trach. Denies SI/HI.

## 2023-04-07 NOTE — Discharge Instructions (Signed)
We evaluated you for your leg pain and your request for detox.We evaluated you for your leg pain and request for detox. Your X-ray and ultrasound are negative. We didn't see any dangerous cause of your leg pain. Please return if it worsens.   We have attached outpatient and residential substance abuse resources. Please call to schedule treatment. You can also follow up at the [facility] urgent care.
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
## 2023-04-11 ENCOUNTER — Inpatient Hospital Stay (HOSPITAL_COMMUNITY)
Admission: RE | Admit: 2023-04-11 | Discharge: 2023-04-11 | Disposition: A | Discharge status: Home / Self Care | Payer: Medicaid Other | Source: Ambulatory Visit

## 2023-04-11 DIAGNOSIS — Z556 Problems related to health literacy: Secondary | ICD-10-CM | POA: Diagnosis present

## 2023-04-11 DIAGNOSIS — Z930 Tracheostomy status: Secondary | ICD-10-CM

## 2023-04-11 DIAGNOSIS — E662 Morbid (severe) obesity with alveolar hypoventilation: Secondary | ICD-10-CM | POA: Diagnosis present

## 2023-04-11 DIAGNOSIS — G4733 Obstructive sleep apnea (adult) (pediatric): Secondary | ICD-10-CM | POA: Diagnosis present

## 2023-04-11 NOTE — Progress Notes (Incomplete)
Reason for visit  Trach management/ possible decannulation   HPI Byrne is well known to me. I follow him for trach management in the setting of MO w/ associated OHS and OSA. CPAP titration study on 12/3 thru [facility] study showing optimal positive pressure 15cmH2O on 12/3 but has really struggled w/ compliance w/ multiple reasons..mask fit, forgets etc..He has been working on losing wt. His PCP has him on [facility] and he has had some significant success w/ this. I saw him last on 12/18. At that time still working on CPAP compliance.  Review of medical records show 8 ER visits since I have seen him last.  Note the most recent visit was in effort to obtain assistance w/ detox from cocaine and ETOH. Looks like most of his visits in January have revolved around his substance abuse.  Presents today for planned trach change    ROS  Exam    Procedure Trach removed. Site inspected. Granulation tissue around stoma but otherwise unremarkable. New trach placed over obturator w/out difficulty. Placement verified via ET[employer]   Impression/plan Active Problems:   * No active hospital problems. *    There are no diagnoses linked to this encounter.   My time 65 min   Simonne Martinet ACNP-BC [facility] Pulmonary/Critical Care Pager # [phone] OR # [phone] if no answer

## 2023-04-13 ENCOUNTER — Ambulatory Visit: Payer: Medicaid Other | Admitting: Podiatry

## 2023-04-14 ENCOUNTER — Telehealth: Payer: Self-pay | Admitting: *Deleted

## 2023-04-14 ENCOUNTER — Encounter: Payer: Self-pay | Admitting: *Deleted

## 2023-04-14 ENCOUNTER — Ambulatory Visit: Payer: Self-pay | Admitting: *Deleted

## 2023-04-14 DIAGNOSIS — Z23 Encounter for immunization: Secondary | ICD-10-CM | POA: Insufficient documentation

## 2023-04-14 NOTE — Assessment & Plan Note (Signed)
  Patient re-educated about  the importance of commitment to a  minimum of 150 minutes of exercise per week as able.  The importance of healthy food choices with portion control discussed, as well as eating regularly and within a 12 hour window most days. The need to choose "clean , green" food 50 to 75% of the time is discussed, as well as to make water the primary drink and set a goal of 64 ounces water daily.       04/07/2023    2:22 AM 03/29/2023   10:25 PM 03/17/2023    3:24 PM  Weight /BMI  Weight 262 lb 263 lb 269 lb 0.6 oz  Height 5\' 5"  (1.651 m) 5\' 5"  (1.651 m) 5\' 5"  (1.651 m)  BMI 43.6 kg/m2 43.77 kg/m2 44.77 kg/m2

## 2023-04-14 NOTE — Assessment & Plan Note (Signed)
Currently stable and asymptomatic

## 2023-04-14 NOTE — Assessment & Plan Note (Signed)
Reports need for and desire to get in patient treatment will assist as able in locating facility will refer to social worker also

## 2023-04-14 NOTE — Assessment & Plan Note (Signed)
 After obtaining informed consent, the influenza vaccine is  administered , with no adverse effect noted at the time of administration.

## 2023-04-14 NOTE — Assessment & Plan Note (Signed)
Cialis prescribed. 

## 2023-04-14 NOTE — Assessment & Plan Note (Signed)
Hyperlipidemia:Low fat diet discussed and encouraged.   Lipid Panel  Lab Results  Component Value Date   CHOL 145 03/17/2023   HDL 36 (L) 03/17/2023   LDLCALC 86 03/17/2023   TRIG 130 03/17/2023   CHOLHDL 4.0 03/17/2023     Controlled, no change in medication

## 2023-04-14 NOTE — Progress Notes (Signed)
Shawn Ramsey     MRN: [id number]      DOB: 29-Sep-1980  Chief Complaint  Patient presents with   Follow-up    Follow up    HPI [name] is here for follow up and re-evaluation of chronic medical conditions, medication management and review of any available recent lab and radiology data.  Preventive health is updated, specifically  Cancer screening and Immunization.   Questions or concerns regarding consultations or procedures which the PT has had in the interim are  addressed. The PT denies any adverse reactions to current medications since the last visit.  There are no new concerns.  There are no specific complaints   ROS Denies recent fever or chills. Denies sinus pressure, nasal congestion, ear pain or sore throat. Denies chest congestion, productive cough or wheezing. Denies chest pains, palpitations and leg swelling Denies abdominal pain, nausea, vomiting,diarrhea or constipation.   Denies dysuria, frequency, hesitancy or incontinence. Denies joint pain, swelling and limitation in mobility. Denies headaches, seizures, numbness, or tingling. Denies depression, anxiety or insomnia. Denies skin break down or rash.   PE  BP 120/70 (BP Location: Right Arm, Patient Position: Sitting, Cuff Size: Large)   Pulse 88   Ht 5\' 5"  (1.651 m)   Wt 269 lb 0.6 oz (122 kg)   SpO2 95%   BMI 44.77 kg/m   Patient alert and oriented and in no cardiopulmonary distress.  HEENT: No facial asymmetry, EOMI,     Neck supple .  Chest: Clear to auscultation bilaterally.  CVS: S1, S2 no murmurs, no S3.Regular rate.  ABD: Soft non tender.   Ext: No edema  MS: Adequate ROM spine, shoulders, hips and knees.  Skin: Intact, no ulcerations or rash noted.  Psych: Good eye contact, normal affect. Memory intact not anxious or depressed appearing.  CNS: CN 2-12 intact, power,  normal throughout.no focal deficits noted.   Assessment & Plan  Type 2 diabetes mellitus with other specified  complication (HCC) Diabetes associated with hyperlipidemia, obesity, vascular disease, and depression  [name] is reminded of the importance of commitment to daily physical activity for 30 minutes or more, as able and the need to limit carbohydrate intake to 30 to 60 grams per meal to help with blood sugar control.   The need to take medication as prescribed, test blood sugar as directed, and to call between visits if there is a concern that blood sugar is uncontrolled is also discussed.   [name] is reminded of the importance of daily foot exam, annual eye examination, and good blood sugar, blood pressure and cholesterol control.     Latest Ref Rng & Units 12/27/2022   10:39 PM 04/12/2022    3:36 PM 03/23/2022    2:34 PM 03/17/2022    9:06 PM 03/17/2022    3:15 PM  Diabetic Labs  HbA1c 4.8 - 5.6 %  5.8      Chol 100 - 199 mg/dL  782      HDL >95 mg/dL  40      Calc LDL 0 - 99 mg/dL  70      Triglycerides 0 - 149 mg/dL  72      Creatinine 6.21 - 1.24 mg/dL 3.08   6.57  8.46  9.62       03/17/2023    3:24 PM 03/13/2023    3:37 PM 03/08/2023    1:32 AM 03/07/2023    2:17 AM 02/23/2023    1:17 AM 02/22/2023  7:18 PM 02/22/2023    7:16 PM  BP/Weight  Systolic BP 120 129   138 130   Diastolic BP 70 85   80 88   Wt. (Lbs) 269.04 280.2     258  BMI 44.77 kg/m2 46.63 kg/m2     42.93 kg/m2     Information is confidential and restricted. Go to Review Flowsheets to unlock data.      03/17/2023    3:20 PM 04/20/2021   10:20 AM  Foot/eye exam completion dates  Foot Form Completion Done Done      Updated lab needed at/ before next visit.   Hyperlipidemia associated with type 2 diabetes mellitus (HCC) Hyperlipidemia:Low fat diet discussed and encouraged.   Lipid Panel  Lab Results  Component Value Date   CHOL 145 03/17/2023   HDL 36 (L) 03/17/2023   LDLCALC 86 03/17/2023   TRIG 130 03/17/2023   CHOLHDL 4.0 03/17/2023     Controlled, no change in medication   ED  (erectile dysfunction) Cialis prescribed  Alcohol consumption binge drinking Reports need for and desire to get in patient treatment will assist as able in locating facility will refer to social worker also  Chronic diastolic heart failure (HCC) Currently stable and asymptomatic  Encounter for immunization After obtaining informed consent, the influenza vaccine is  administered , with no adverse effect noted at the time of administration.   Morbid obesity due to excess calories [facility])  Patient re-educated about  the importance of commitment to a  minimum of 150 minutes of exercise per week as able.  The importance of healthy food choices with portion control discussed, as well as eating regularly and within a 12 hour window most days. The need to choose "clean , green" food 50 to 75% of the time is discussed, as well as to make water the primary drink and set a goal of 64 ounces water daily.       04/07/2023    2:22 AM 03/29/2023   10:25 PM 03/17/2023    3:24 PM  Weight /BMI  Weight 262 lb 263 lb 269 lb 0.6 oz  Height 5\' 5"  (1.651 m) 5\' 5"  (1.651 m) 5\' 5"  (1.651 m)  BMI 43.6 kg/m2 43.77 kg/m2 44.77 kg/m2

## 2023-04-14 NOTE — Patient Instructions (Signed)
Visit Information  Thank you for taking time to visit with me today. Please don't hesitate to contact me if I can be of assistance to you.   Following are the goals we discussed today:   Goals Addressed               This Visit's Progress     Receive Assistance Pursuing Inpatient Alcohol & Drug Rehabilitation Treatment Services. (pt-stated)   On track     Care Coordination Interventions:  Interventions Today    Flowsheet Row Most Recent Value  Chronic Disease   Chronic disease during today's visit Hypertension (HTN), Diabetes, Other  [Tracheostomy, Alcohol Consumpion Binge Drinking, Chronic Pain, Cigarette Nicotine Dependence, Depression, Insomnia, Metabolic Syndrome X, Morbid Obesity, Substance Abuse, Vitamin D Deficiency, Hyperlipidemia, Requesting Inpatient Alcohol & Drug Treatment]  General Interventions   General Interventions Discussed/Reviewed General Interventions Discussed, General Interventions Reviewed, Annual Eye Exam, Durable Medical Equipment (DME), Lipid Profile, Annual Foot Exam, Labs, Vaccines, Doctor Visits, Level of Care, [employer], Communication with, Health Screening  [Encouraged Routine Engagement with Care Team Members & Providers.]  Labs Hgb A1c every 3 months, Kidney Function, Hgb A1c annually  [Encouraged Routine Lab Work.]  Vaccines COVID-19, Flu, Pneumonia, RSV, Shingles, Tetanus/Pertussis/Diphtheria  [Encouraged Routine Vaccinations.]  Doctor Visits Discussed/Reviewed Doctor Visits Discussed, Specialist, Doctor Visits Reviewed, Annual Wellness Visits, PCP  [Encouraged Routine Engagement with Care Team Members & Providers.]  Health Screening Bone Density, Prostate, Colonoscopy  [Encouraged Routine Health Screenings.]  Durable Medical Equipment (DME) Other, Glucomoter, BP Cuff, Walker, Public librarian, Physiological scientist, Scales.]  Wheelchair Standard  PCP/Specialist Visits Compliance with follow-up visit  [Encouraged Routine Engagement  with Care Team Members & Providers.]  Communication with PCP/Specialists, Charity fundraiser, Pharmacists, Social Work  [employer] Routine Engagement with Care Team Members & Providers.]  Level of Care Adult Daycare, Air traffic controller, Assisted Living, Skilled Nursing Facility  [Confirmed Disinterest in Enrollment in Adult Day Care Program. Confirmed Disinterest in Higher Level of Care Placement Options. Confirmed Interest in Pursuing Inpatient Alcohol & Drug Treatment Services.]  Applications Medicaid, Personal Care Services  [Confirmed Active Adult Medicaid Status. Confirmed Disinterest in Applying for Personal Care Services.]  Exercise Interventions   Exercise Discussed/Reviewed Exercise Discussed, Assistive device use and maintanence, Weight Managment, Physical Activity, Exercise Reviewed  [Encouraged Daily Exercise Regimen, as Tolerated.]  Physical Activity Discussed/Reviewed Physical Activity Discussed, Home Exercise Program (HEP), PREP, Physical Activity Reviewed, Gym, Types of exercise  [Encouraged Increased Level of Activity & Exercise, Inside & Outside the Home.]  Weight Management Weight loss  [Encouraged Healthy Diet.]  Education Interventions   Education Provided Provided Therapist, sports, Provided Web-based Education, Provided Education  [employer] Reviewed Educational Material to [employer] Entertain Questions.]  Provided Verbal Education On Nutrition, Mental Health/Coping with Illness, When to see the doctor, Foot Care, Eye Care, Applications, Labs, Blood Sugar Monitoring, Exercise, Medication, [employer], Development worker, community  [Encouraged Continued Independent Review of Educational Material Provided.]  Ship broker, Personal Care Services  [Confirmed Active Adult Medicaid Status. Confirmed Disinterest in Applying for Personal Care Services.]  Mental Health Interventions   Mental Health Discussed/Reviewed Mental Health Discussed, Anxiety, Depression, Mental Health Reviewed, Grief  and Loss, Substance Abuse, Coping Strategies, Suicide, Crisis, Other  [Assessed Mental Health & Cognitive Status. Offered Counseling & Supportive Services.]  Nutrition Interventions   Nutrition Discussed/Reviewed Nutrition Discussed, Adding fruits and vegetables, Increasing proteins, Decreasing fats, Nutrition Reviewed, Fluid intake, Decreasing salt, Carbohydrate meal planning, Portion sizes, Decreasing sugar intake  [Encouraged Healthy Diet.]  Pharmacy Interventions  Pharmacy Dicussed/Reviewed Pharmacy Topics Discussed, Medications and their functions, Medication Adherence, Pharmacy Topics Reviewed, Affording Medications  [Confirmed Ability to Afford Prescription Medications.]  Medication Adherence --  [Confirmed Compliance with Prescription Medications.]  Safety Interventions   Safety Discussed/Reviewed Safety Discussed, Safety Reviewed  [Encouraged Routine Use of Assistive Devices & Durable Medical Equipment.]  Advanced Directive Interventions   Advanced Directives Discussed/Reviewed Advanced Directives Discussed, Advanced Directives Reviewed  [Encouraged Initiation of Advanced Directives (Living Will & Healthcare Power of Attorney Documents), Offering to [employer], Assist with Completion, Make Copies, & Scan into Electronic Medical Record in Epic.]      Assessed Social Determinant of Health Barriers. Discussed Plans for Ongoing Care Management Follow Up. Provided Careers information officer Information for Care Management Team Members. Screened for Signs & Symptoms of Depression, Related to Chronic Disease State.  PHQ2 & PHQ9 Depression Screen Completed & Results Reviewed.  Suicidal Ideation & Homicidal Ideation Assessed - None Present.   Domestic Violence Assessed - None Present. Access to Weapons Assessed - None Present.   Active Listening & Reflection Utilized.  Verbalization of Feelings Encouraged.  Emotional Support Provided. Feelings of Frustration Validated. Need for Inpatient Alcohol &  Drug Treatment Services Acknowledged. Alcohol & Drug Treatment Services Reviewed. Self-Enrollment in Alcohol & Drug Treatment Program of Interest Emphasized, from List Provided. Crisis Support Information, Agencies, Services, & Resources Discussed. Problem Solving Interventions Identified. Task-Centered Solutions Implemented.   Solution-Focused Strategies Developed. Acceptance & Commitment Therapy Introduced. Brief Cognitive Behavioral Therapy Initiated. Client-Centered Therapy Enacted. Reviewed Prescription Medications & Discussed Importance of Compliance. Quality of Sleep Assessed & Sleep Hygiene Techniques Promoted. Encouraged Routine Engagement with Danford Bad, Licensed Clinical Social Worker with [facility], [facility] [phone]), if You Have Questions, Need Assistance, or If Additional Social Work Needs Are Identified Between Now & Our Next Follow-Up Outreach Call, Scheduled on 04/18/2023 at 11:15 AM.      Our next appointment is by telephone on 04/18/2023 at 11:15 am.  Please call the care guide team at [phone] if you need to cancel or reschedule your appointment.   If you are experiencing a Mental Health or Behavioral Health Crisis or need someone to talk to, please call the Suicide and Crisis Lifeline: 988 call the [place] National Suicide Prevention Lifeline: 647-517-9583 or TTY: 5417353487 TTY 225 238 9555) to talk to a trained counselor call 1-800-273-TALK (toll free, 24 hour hotline) go to [facility] [address], [place] [phone]) call the [facility] Crisis Line: [phone] call 911  Patient verbalizes understanding of instructions and care plan provided today and agrees to view in MyChart. Active MyChart status and patient understanding of how to access instructions and care plan via MyChart confirmed with patient.     Telephone follow up appointment with care  management team member scheduled for:   04/18/2023 at 11:15 am.   Danford Bad, BSW, MSW, LCSW [place]  [facility], [facility] Clinical Social Worker II Direct Dial: [phone]  Fax: [phone] Website: [email or web address]

## 2023-04-14 NOTE — Progress Notes (Signed)
Complex Care Management Note  Care Guide Note 04/14/2023 Name: MUADH CREASY MRN: [id number] DOB: March 12, 1980  Joneen Roach Pullin is a 43 y.o. year old male who sees Kerri Perches, MD for primary care. I reached out to [name] by phone today to offer complex care management services.  Mr. Vandervelden was given information about Complex Care Management services today including:   The Complex Care Management services include support from the care team which includes your Nurse Care Manager, Clinical Social Worker, or Pharmacist.  The Complex Care Management team is here to help remove barriers to the health concerns and goals most important to you. Complex Care Management services are voluntary, and the patient may decline or stop services at any time by request to their care team member.   Complex Care Management Consent Status: Patient agreed to services and verbal consent obtained.   Follow up plan:  Telephone appointment with complex care management team member scheduled for:  04/14/23  Encounter Outcome:  Patient Scheduled  Gwenevere Ghazi  [facility]  [facility], [facility] Guide  Direct Dial: [phone]  Fax [phone]

## 2023-04-14 NOTE — Patient Outreach (Signed)
Care Coordination   Initial Visit Note   04/14/2023  Name: Shawn Ramsey MRN: [id number] DOB: 1980-05-26  Shawn Ramsey is a 43 y.o. year old male who sees Kerri Perches, MD for primary care. I spoke with [name] by phone today.  What matters to the patients health and wellness today?  Receive Assistance Pursuing Inpatient Alcohol & Drug Rehabilitation Treatment Services.    Goals Addressed               This Visit's Progress     Receive Assistance Pursuing Inpatient Alcohol & Drug Rehabilitation Treatment Services. (pt-stated)   On track     Care Coordination Interventions:  Interventions Today    Flowsheet Row Most Recent Value  Chronic Disease   Chronic disease during today's visit Hypertension (HTN), Diabetes, Other  [Tracheostomy, Alcohol Consumpion Binge Drinking, Chronic Pain, Cigarette Nicotine Dependence, Depression, Insomnia, Metabolic Syndrome X, Morbid Obesity, Substance Abuse, Vitamin D Deficiency, Hyperlipidemia, Requesting Inpatient Alcohol & Drug Treatment]  General Interventions   General Interventions Discussed/Reviewed General Interventions Discussed, General Interventions Reviewed, Annual Eye Exam, Durable Medical Equipment (DME), Lipid Profile, Annual Foot Exam, Labs, Vaccines, Doctor Visits, Level of Care, [employer], Communication with, Health Screening  [Encouraged Routine Engagement with Care Team Members & Providers.]  Labs Hgb A1c every 3 months, Kidney Function, Hgb A1c annually  [Encouraged Routine Lab Work.]  Vaccines COVID-19, Flu, Pneumonia, RSV, Shingles, Tetanus/Pertussis/Diphtheria  [Encouraged Routine Vaccinations.]  Doctor Visits Discussed/Reviewed Doctor Visits Discussed, Specialist, Doctor Visits Reviewed, Annual Wellness Visits, PCP  [Encouraged Routine Engagement with Care Team Members & Providers.]  Health Screening Bone Density, Prostate, Colonoscopy  [Encouraged Routine Health Screenings.]  Durable Medical  Equipment (DME) Other, Glucomoter, BP Cuff, Walker, Public librarian, Physiological scientist, Scales.]  Wheelchair Standard  PCP/Specialist Visits Compliance with follow-up visit  [Encouraged Routine Engagement with Care Team Members & Providers.]  Communication with PCP/Specialists, Charity fundraiser, Pharmacists, Social Work  [employer] Routine Engagement with Care Team Members & Providers.]  Level of Care Adult Daycare, Air traffic controller, Assisted Living, Skilled Nursing Facility  [Confirmed Disinterest in Enrollment in Adult Day Care Program. Confirmed Disinterest in Higher Level of Care Placement Options. Confirmed Interest in Pursuing Inpatient Alcohol & Drug Treatment Services.]  Applications Medicaid, Personal Care Services  [Confirmed Active Adult Medicaid Status. Confirmed Disinterest in Applying for Personal Care Services.]  Exercise Interventions   Exercise Discussed/Reviewed Exercise Discussed, Assistive device use and maintanence, Weight Managment, Physical Activity, Exercise Reviewed  [Encouraged Daily Exercise Regimen, as Tolerated.]  Physical Activity Discussed/Reviewed Physical Activity Discussed, Home Exercise Program (HEP), PREP, Physical Activity Reviewed, Gym, Types of exercise  [Encouraged Increased Level of Activity & Exercise, Inside & Outside the Home.]  Weight Management Weight loss  [Encouraged Healthy Diet.]  Education Interventions   Education Provided Provided Therapist, sports, Provided Web-based Education, Provided Education  [employer] Reviewed Educational Material to [employer] Questions.]  Provided Verbal Education On Nutrition, Mental Health/Coping with Illness, When to see the doctor, Foot Care, Eye Care, Applications, Labs, Blood Sugar Monitoring, Exercise, Medication, [employer], Development worker, community  [Encouraged Continued Independent Review of Educational Material Provided.]  Ship broker, Personal Care Services  [Confirmed Active Adult  Medicaid Status. Confirmed Disinterest in Applying for Personal Care Services.]  Mental Health Interventions   Mental Health Discussed/Reviewed Mental Health Discussed, Anxiety, Depression, Mental Health Reviewed, Grief and Loss, Substance Abuse, Coping Strategies, Suicide, Crisis, Other  [Assessed Mental Health & Cognitive Status. Offered Counseling & Supportive Services.]  Nutrition Interventions  Nutrition Discussed/Reviewed Nutrition Discussed, Adding fruits and vegetables, Increasing proteins, Decreasing fats, Nutrition Reviewed, Fluid intake, Decreasing salt, Carbohydrate meal planning, Portion sizes, Decreasing sugar intake  [Encouraged Healthy Diet.]  Pharmacy Interventions   Pharmacy Dicussed/Reviewed Pharmacy Topics Discussed, Medications and their functions, Medication Adherence, Pharmacy Topics Reviewed, Affording Medications  [Confirmed Ability to [employer] Prescription Medications.]  Medication Adherence --  [Confirmed Compliance with Prescription Medications.]  Safety Interventions   Safety Discussed/Reviewed Safety Discussed, Safety Reviewed  [Encouraged Routine Use of Assistive Devices & Durable Medical Equipment.]  Advanced Directive Interventions   Advanced Directives Discussed/Reviewed Advanced Directives Discussed, Advanced Directives Reviewed  [Encouraged Initiation of Advanced Directives (Living Will & Healthcare Power of Attorney Documents), Offering to [employer], Assist with Completion, Make Copies, & Scan into Electronic Medical Record in Epic.]      Assessed Social Determinant of Health Barriers. Discussed Plans for Ongoing Care Management Follow Up. Provided Careers information officer Information for Care Management Team Members. Screened for Signs & Symptoms of Depression, Related to Chronic Disease State.  PHQ2 & PHQ9 Depression Screen Completed & Results Reviewed.  Suicidal Ideation & Homicidal Ideation Assessed - None Present.   Domestic Violence Assessed - None  Present. Access to Weapons Assessed - None Present.   Active Listening & Reflection Utilized.  Verbalization of Feelings Encouraged.  Emotional Support Provided. Feelings of Frustration Validated. Need for Inpatient Alcohol & Drug Treatment Services Acknowledged. Alcohol & Drug Treatment Services Reviewed. Self-Enrollment in Alcohol & Drug Treatment Program of Interest Emphasized, from List Provided. Crisis Support Information, Agencies, Services, & Resources Discussed. Problem Solving Interventions Identified. Task-Centered Solutions Implemented.   Solution-Focused Strategies Developed. Acceptance & Commitment Therapy Introduced. Brief Cognitive Behavioral Therapy Initiated. Client-Centered Therapy Enacted. Reviewed Prescription Medications & Discussed Importance of Compliance. Quality of Sleep Assessed & Sleep Hygiene Techniques Promoted. Encouraged Routine Engagement with Danford Bad, Licensed Clinical Social Worker with [facility], [facility] [phone]), if You Have Questions, Need Assistance, or If Additional Social Work Needs Are Identified Between Now & Our Next Follow-Up Outreach Call, Scheduled on 04/18/2023 at 11:15 AM.        SDOH assessments and interventions completed:  Yes.  SDOH Interventions Today    Flowsheet Row Most Recent Value  SDOH Interventions   Food Insecurity Interventions Intervention Not Indicated  Housing Interventions Intervention Not Indicated  Transportation Interventions Intervention Not Indicated, Patient Resources (Friends/Family), Payor Benefit, Community Resources Provided  Utilities Interventions Intervention Not Indicated  Alcohol Usage Interventions Community Resources Provided, Alcohol Education/Brief Advice, Other (Comment)  [Pursuing Inpatient Rehabilitation Program.]  Financial Strain Interventions Intervention Not Indicated  Physical Activity Interventions Patient Declined  Stress  Interventions Community Resources Provided, Provide Counseling, Offered Community Wellness Resources  Social Connections Interventions Intervention Not Indicated  Health Literacy Interventions Intervention Not Indicated     Care Coordination Interventions:  Yes, provided.   Follow up plan: Follow up call scheduled for 04/18/2023 at 11:15 am.  Encounter Outcome:  Patient Visit Completed.    Danford Bad, BSW, MSW, LCSW [facility], [facility] Clinical Social Worker II Direct Dial: [phone]  Fax: [phone] Website: [email or web address]

## 2023-04-18 ENCOUNTER — Ambulatory Visit: Payer: Self-pay | Admitting: *Deleted

## 2023-04-18 NOTE — Patient Outreach (Addendum)
 Care Coordination   Follow Up Visit Note   04/18/2023  Name: Shawn Ramsey MRN: [id number] DOB: 03/09/1980  Shawn Ramsey is a 43 y.o. year old male who sees Kerri Perches, MD for primary care. I spoke with [name] by phone today.  What matters to the patients health and wellness today?  Receive Assistance Pursuing Inpatient Alcohol & Drug Rehabilitation Treatment Services.    Goals Addressed               This Visit's Progress     Receive Assistance Pursuing Inpatient Alcohol & Drug Rehabilitation Treatment Services. (pt-stated)   On track     Care Coordination Interventions:  Interventions Today    Flowsheet Row Most Recent Value  Chronic Disease   Chronic disease during today's visit Hypertension (HTN), Diabetes, Other  [Tracheostomy, Alcohol Consumpion Binge Drinking, Chronic Pain, Cigarette Nicotine Dependence, Depression, Insomnia, Metabolic Syndrome X, Morbid Obesity, Substance Abuse, Vitamin D Deficiency, Hyperlipidemia, Requesting Inpatient Alcohol & Drug Treatment]  General Interventions   General Interventions Discussed/Reviewed General Interventions Discussed, General Interventions Reviewed, Annual Eye Exam, Durable Medical Equipment (DME), Lipid Profile, Annual Foot Exam, Labs, Vaccines, Doctor Visits, Level of Care, [employer], Communication with, Health Screening  [Encouraged Routine Engagement with Care Team Members & Providers.]  Labs Hgb A1c every 3 months, Kidney Function, Hgb A1c annually  [Encouraged Routine Lab Work.]  Vaccines COVID-19, Flu, Pneumonia, RSV, Shingles, Tetanus/Pertussis/Diphtheria  [Encouraged Routine Vaccinations.]  Doctor Visits Discussed/Reviewed Doctor Visits Discussed, Specialist, Doctor Visits Reviewed, Annual Wellness Visits, PCP  [Encouraged Routine Engagement with Care Team Members & Providers.]  Health Screening Bone Density, Prostate, Colonoscopy  [Encouraged Routine Health Screenings.]  Durable Medical  Equipment (DME) Other, Glucomoter, BP Cuff, Walker, Public librarian, Physiological scientist, Scales.]  Wheelchair Standard  PCP/Specialist Visits Compliance with follow-up visit  [Encouraged Routine Engagement with Care Team Members & Providers.]  Communication with PCP/Specialists, Charity fundraiser, Pharmacists, Social Work  [employer] Routine Engagement with Care Team Members & Providers.]  Level of Care Adult Daycare, Air traffic controller, Assisted Living, Skilled Nursing Facility  [Confirmed Disinterest in Enrollment in Adult Day Care Program. Confirmed Disinterest in Higher Level of Care Placement Options. Confirmed Interest in Pursuing Inpatient Alcohol & Drug Treatment Services.]  Applications Medicaid, Personal Care Services  [Confirmed Active Adult Medicaid Status. Confirmed Disinterest in Applying for Personal Care Services.]  Exercise Interventions   Exercise Discussed/Reviewed Exercise Discussed, Assistive device use and maintanence, Weight Managment, Physical Activity, Exercise Reviewed  [Encouraged Daily Exercise Regimen, as Tolerated.]  Physical Activity Discussed/Reviewed Physical Activity Discussed, Home Exercise Program (HEP), PREP, Physical Activity Reviewed, Gym, Types of exercise  [Encouraged Increased Level of Activity & Exercise, Inside & Outside the Home.]  Weight Management Weight loss  [Encouraged Healthy Diet.]  Education Interventions   Education Provided Provided Therapist, sports, Provided Web-based Education, Provided Education  [employer] Reviewed Educational Material to [employer] & Entertain Questions.]  Provided Verbal Education On Nutrition, Mental Health/Coping with Illness, When to see the doctor, Foot Care, Eye Care, Applications, Labs, Blood Sugar Monitoring, Exercise, Medication, [employer], Development worker, community  [Encouraged Continued Independent Review of Educational Material Provided.]  Ship broker, Personal Care Services  [Confirmed Active Adult  Medicaid Status. Confirmed Disinterest in Applying for Personal Care Services.]  Mental Health Interventions   Mental Health Discussed/Reviewed Mental Health Discussed, Anxiety, Depression, Mental Health Reviewed, Grief and Loss, Substance Abuse, Coping Strategies, Suicide, Crisis, Other  [Assessed Mental Health & Cognitive Status. Offered Counseling & Supportive Services.]  Nutrition Interventions  Nutrition Discussed/Reviewed Nutrition Discussed, Adding fruits and vegetables, Increasing proteins, Decreasing fats, Nutrition Reviewed, Fluid intake, Decreasing salt, Carbohydrate meal planning, Portion sizes, Decreasing sugar intake  [Encouraged Healthy Diet.]  Pharmacy Interventions   Pharmacy Dicussed/Reviewed Pharmacy Topics Discussed, Medications and their functions, Medication Adherence, Pharmacy Topics Reviewed, Affording Medications  [Confirmed Ability to [employer] Prescription Medications.]  Medication Adherence --  [Confirmed Compliance with Prescription Medications.]  Safety Interventions   Safety Discussed/Reviewed Safety Discussed, Safety Reviewed  [Encouraged Routine Use of Assistive Devices & Durable Medical Equipment.]  Advanced Directive Interventions   Advanced Directives Discussed/Reviewed Advanced Directives Discussed, Advanced Directives Reviewed  [Encouraged Initiation of Advanced Directives (Living Will & Healthcare Power of Corporate treasurer), Offering to [employer], Assist with Completion, Make Copies, & Scan into Electronic Medical Record in Epic.]      Active Listening & Reflection Utilized.  Verbalization of Feelings Encouraged.  Emotional Support Provided. Problem Solving Interventions Activated. Task-Centered Solutions Employed.   Solution-Focused Strategies Implemented. Acceptance & Commitment Therapy Indicated. Cognitive Behavioral Therapy Initiated. Client-Centered Therapy Performed. Confirmed Receipt & Thoroughly Reviewed List of Alcohol & Drug Treatment  Programs Within [address], to Ensure Understanding & Entertain Questions. Encouraged Engagement with Alcohol & Drug Treatment Programs of Interest, from List Provided. Encouraged Routine Engagement with Danford Bad, Licensed Clinical Social Worker with [facility], [facility] [phone]), if You Have Questions, Need Assistance, or If Additional Social Work Needs Are Identified Between Now & Our Next Follow-Up Outreach Call, Scheduled on 05/11/2023 at 2:15 PM.      SDOH assessments and interventions completed:  Yes.  Care Coordination Interventions:  Yes, provided.   Follow up plan: Follow up call scheduled for 05/11/2023 at 2:15 pm.  Encounter Outcome:  Patient Visit Completed.   Danford Bad, BSW, MSW, LCSW [facility], [facility] Clinical Social Worker II Direct Dial: [phone]  Fax: [phone] Website: [email or web address]

## 2023-04-18 NOTE — Patient Instructions (Addendum)
 Visit Information  Thank you for taking time to visit with me today. Please don't hesitate to contact me if I can be of assistance to you.   Following are the goals we discussed today:   Goals Addressed               This Visit's Progress     Receive Assistance Pursuing Inpatient Alcohol & Drug Rehabilitation Treatment Services. (pt-stated)   On track     Care Coordination Interventions:  Interventions Today    Flowsheet Row Most Recent Value  Chronic Disease   Chronic disease during today's visit Hypertension (HTN), Diabetes, Other  [Tracheostomy, Alcohol Consumpion Binge Drinking, Chronic Pain, Cigarette Nicotine Dependence, Depression, Insomnia, Metabolic Syndrome X, Morbid Obesity, Substance Abuse, Vitamin D Deficiency, Hyperlipidemia, Requesting Inpatient Alcohol & Drug Treatment]  General Interventions   General Interventions Discussed/Reviewed General Interventions Discussed, General Interventions Reviewed, Annual Eye Exam, Durable Medical Equipment (DME), Lipid Profile, Annual Foot Exam, Labs, Vaccines, Doctor Visits, Level of Care, [employer], Communication with, Health Screening  [Encouraged Routine Engagement with Care Team Members & Providers.]  Labs Hgb A1c every 3 months, Kidney Function, Hgb A1c annually  [Encouraged Routine Lab Work.]  Vaccines COVID-19, Flu, Pneumonia, RSV, Shingles, Tetanus/Pertussis/Diphtheria  [Encouraged Routine Vaccinations.]  Doctor Visits Discussed/Reviewed Doctor Visits Discussed, Specialist, Doctor Visits Reviewed, Annual Wellness Visits, PCP  [Encouraged Routine Engagement with Care Team Members & Providers.]  Health Screening Bone Density, Prostate, Colonoscopy  [Encouraged Routine Health Screenings.]  Durable Medical Equipment (DME) Other, Glucomoter, BP Cuff, Walker, Public librarian, Physiological scientist, Scales.]  Wheelchair Standard  PCP/Specialist Visits Compliance with follow-up visit  [Encouraged Routine Engagement  with Care Team Members & Providers.]  Communication with PCP/Specialists, Charity fundraiser, Pharmacists, Social Work  [employer] Routine Engagement with Care Team Members & Providers.]  Level of Care Adult Daycare, Air traffic controller, Assisted Living, Skilled Nursing Facility  [Confirmed Disinterest in Enrollment in Adult Day Care Program. Confirmed Disinterest in Higher Level of Care Placement Options. Confirmed Interest in Pursuing Inpatient Alcohol & Drug Treatment Services.]  Applications Medicaid, Personal Care Services  [Confirmed Active Adult Medicaid Status. Confirmed Disinterest in Applying for Personal Care Services.]  Exercise Interventions   Exercise Discussed/Reviewed Exercise Discussed, Assistive device use and maintanence, Weight Managment, Physical Activity, Exercise Reviewed  [Encouraged Daily Exercise Regimen, as Tolerated.]  Physical Activity Discussed/Reviewed Physical Activity Discussed, Home Exercise Program (HEP), PREP, Physical Activity Reviewed, Gym, Types of exercise  [Encouraged Increased Level of Activity & Exercise, Inside & Outside the Home.]  Weight Management Weight loss  [Encouraged Healthy Diet.]  Education Interventions   Education Provided Provided Therapist, sports, Provided Web-based Education, Provided Education  [employer] Reviewed Educational Material to [employer] & Entertain Questions.]  Provided Verbal Education On Nutrition, Mental Health/Coping with Illness, When to see the doctor, Foot Care, Eye Care, Applications, Labs, Blood Sugar Monitoring, Exercise, Medication, [employer], Development worker, community  [Encouraged Continued Independent Review of Educational Material Provided.]  Ship broker, Personal Care Services  [Confirmed Active Adult Medicaid Status. Confirmed Disinterest in Applying for Personal Care Services.]  Mental Health Interventions   Mental Health Discussed/Reviewed Mental Health Discussed, Anxiety, Depression, Mental Health Reviewed, Grief  and Loss, Substance Abuse, Coping Strategies, Suicide, Crisis, Other  [Assessed Mental Health & Cognitive Status. Offered Counseling & Supportive Services.]  Nutrition Interventions   Nutrition Discussed/Reviewed Nutrition Discussed, Adding fruits and vegetables, Increasing proteins, Decreasing fats, Nutrition Reviewed, Fluid intake, Decreasing salt, Carbohydrate meal planning, Portion sizes, Decreasing sugar intake  [Encouraged Healthy Diet.]  Pharmacy Interventions  Pharmacy Dicussed/Reviewed Pharmacy Topics Discussed, Medications and their functions, Medication Adherence, Pharmacy Topics Reviewed, Affording Medications  [Confirmed Ability to Afford Prescription Medications.]  Medication Adherence --  [Confirmed Compliance with Prescription Medications.]  Safety Interventions   Safety Discussed/Reviewed Safety Discussed, Safety Reviewed  [Encouraged Routine Use of Assistive Devices & Durable Medical Equipment.]  Advanced Directive Interventions   Advanced Directives Discussed/Reviewed Advanced Directives Discussed, Advanced Directives Reviewed  [Encouraged Initiation of Advanced Directives (Living Will & Healthcare Power of Corporate treasurer), Offering to [employer], Assist with Completion, Make Copies, & Scan into Electronic Medical Record in Epic.]      Active Listening & Reflection Utilized.  Verbalization of Feelings Encouraged.  Emotional Support Provided. Problem Solving Interventions Activated. Task-Centered Solutions Employed.   Solution-Focused Strategies Implemented. Acceptance & Commitment Therapy Indicated. Cognitive Behavioral Therapy Initiated. Client-Centered Therapy Performed. Confirmed Receipt & Thoroughly Reviewed List of Alcohol & Drug Treatment Programs Within [address] to Ensure Understanding & Entertain Questions. Encouraged Engagement with Alcohol & Drug Treatment Programs of Interest, from List Provided. Encouraged Routine Engagement with Danford Bad,  Licensed Clinical Social Worker with [facility], [facility] [phone]), if You Have Questions, Need Assistance, or If Additional Social Work Needs Are Identified Between Now & Our Next Follow-Up Outreach Call, Scheduled on 05/11/2023 at 2:15 PM.      Our next appointment is by telephone on 05/11/2023 at 2:15 pm.  Please call the care guide team at [phone] if you need to cancel or reschedule your appointment.   If you are experiencing a Mental Health or Behavioral Health Crisis or need someone to talk to, please call the Suicide and Crisis Lifeline: 988 call the [place] National Suicide Prevention Lifeline: 952 396 9552 or TTY: (757)703-5510 TTY 308-699-1829) to talk to a trained counselor call 1-800-273-TALK (toll free, 24 hour hotline) go to [facility] [address] [place] [phone]) call the [facility] Crisis Line: ([phone] call 911  Patient verbalizes understanding of instructions and care plan provided today and agrees to view in MyChart. Active MyChart status and patient understanding of how to access instructions and care plan via MyChart confirmed with patient.     Telephone follow up appointment with care management team member scheduled for:  05/11/2023 at 2:15 pm.  Danford Bad, BSW, MSW, LCSW [place]  [facility], [facility] Clinical Social Worker II Direct Dial: [phone]  Fax: [phone] Website: [email or web address]

## 2023-04-20 ENCOUNTER — Ambulatory Visit: Payer: Medicaid Other | Admitting: Podiatry

## 2023-04-21 ENCOUNTER — Ambulatory Visit (HOSPITAL_COMMUNITY)
Admission: RE | Admit: 2023-04-21 | Discharge: 2023-04-21 | Disposition: A | Discharge status: Home / Self Care | Payer: Medicaid Other | Source: Ambulatory Visit | Attending: Family Medicine | Admitting: Family Medicine

## 2023-04-21 DIAGNOSIS — J45991 Cough variant asthma: Secondary | ICD-10-CM | POA: Diagnosis not present

## 2023-04-21 DIAGNOSIS — G4733 Obstructive sleep apnea (adult) (pediatric): Secondary | ICD-10-CM | POA: Diagnosis not present

## 2023-04-21 DIAGNOSIS — Z930 Tracheostomy status: Secondary | ICD-10-CM | POA: Diagnosis present

## 2023-04-21 NOTE — Progress Notes (Signed)
 Tracheostomy Procedure Note  Shawn Ramsey [id number] [birth year]  Pre Procedure Tracheostomy Information  Trach Brand: Shiley Size:  6.0 60XLTUP Style: Uncuffed Secured by: Velcro   Procedure: Trach Change and Trach Cleaning    Post Procedure Tracheostomy Information  Trach Brand: Shiley Size:  6.0  60XLTUP Style: Uncuffed Secured by: Velcro   Post Procedure Evaluation:  ETCO2 positive color change from yellow to purple : Yes.   Vital signs:VSS Patients current condition: stable Complications: No apparent complications Trach site exam: clean, dry Wound care done: 4 x 4 gauze  drain Patient did tolerate procedure well.   Education: none  Prescription needs: none    Additional needs: New PMV given to patient at this visit

## 2023-04-21 NOTE — Progress Notes (Signed)
 Reason for visit  Trach management/ possible decannulation   HPI Shawn Ramsey is well known to me. I follow him for trach management in the setting of MO w/ associated OHS and OSA. CPAP titration study on 12/3 thru [facility] study showing optimal positive pressure 15cmH2O on 12/3 but has really struggled w/ compliance w/ multiple reasons..mask fit, forgets etc..He has been working on losing wt. His PCP has him on [facility] and he has had some significant success w/ this. I saw him last on 12/18. At that time still working on CPAP compliance.  Review of medical records show 9 ER visits since I have seen him last.  Note the most recent visit was in effort to obtain assistance w/ detox from cocaine and ETOH. Looks like most of his visits in January have revolved around his substance abuse.  Presents today for planned trach change    Review of Systems  Constitutional:  Negative for fever and weight loss.  HENT:  Negative for congestion, hearing loss and tinnitus.   Eyes: Negative.   Respiratory: Negative.    Cardiovascular:  Negative for chest pain and orthopnea.  Gastrointestinal: Negative.   Genitourinary: Negative.   Skin: Negative.     Exam  General no distress ambulatory  HENT sp quality at baseline. Trach midline. Has 5 prox xlt. Phonation quality at baseline Pulm clear Card rrr Abd soft Neuro intact   Procedure Trach removed. Site inspected. Granulation tissue around stoma but otherwise unremarkable. New trach placed over obturator w/out difficulty. Placement verified via ETCO2   Impression/plan Active Problems:   Tracheostomy status (HCC)   Morbid obesity due to excess calories (HCC)   OSA (obstructive sleep apnea)   Cough variant asthma    Tracheostomy status (HCC) Overview: Followed at [facility].  Size 6 cuffless proximal XLT shiley Last change today 2/28 (previously 12/18)   Discussion His trach is stable and he is clear for decannulation IMO  once he can prove he will use his CPAP regularly. His compliance continues being the primary barrier to decannulation   Plan ROV 12 weeks to assess compliance w/ CPAP and readiness for decannulation (I am doubtful that he will be) Cont routine trach care Cont routine trach change           My time 22 min   Simonne Martinet ACNP-BC [facility] Pulmonary/Critical Care Pager # [phone] OR # [phone] if no answer

## 2023-04-27 ENCOUNTER — Ambulatory Visit: Payer: Medicaid Other | Admitting: Podiatry

## 2023-04-27 ENCOUNTER — Emergency Department (HOSPITAL_COMMUNITY)
Admission: EM | Admit: 2023-04-27 | Discharge: 2023-04-27 | Disposition: A | Discharge status: Home / Self Care | Attending: Emergency Medicine | Admitting: Emergency Medicine

## 2023-04-27 ENCOUNTER — Other Ambulatory Visit: Payer: Self-pay

## 2023-04-27 DIAGNOSIS — Z79899 Other long term (current) drug therapy: Secondary | ICD-10-CM | POA: Diagnosis not present

## 2023-04-27 DIAGNOSIS — J449 Chronic obstructive pulmonary disease, unspecified: Secondary | ICD-10-CM | POA: Diagnosis not present

## 2023-04-27 DIAGNOSIS — E119 Type 2 diabetes mellitus without complications: Secondary | ICD-10-CM | POA: Insufficient documentation

## 2023-04-27 DIAGNOSIS — R600 Localized edema: Secondary | ICD-10-CM | POA: Diagnosis not present

## 2023-04-27 DIAGNOSIS — F1994 Other psychoactive substance use, unspecified with psychoactive substance-induced mood disorder: Secondary | ICD-10-CM | POA: Diagnosis not present

## 2023-04-27 DIAGNOSIS — F1010 Alcohol abuse, uncomplicated: Secondary | ICD-10-CM | POA: Diagnosis not present

## 2023-04-27 DIAGNOSIS — F29 Unspecified psychosis not due to a substance or known physiological condition: Secondary | ICD-10-CM | POA: Insufficient documentation

## 2023-04-27 DIAGNOSIS — Z8616 Personal history of COVID-19: Secondary | ICD-10-CM | POA: Diagnosis not present

## 2023-04-27 DIAGNOSIS — F1914 Other psychoactive substance abuse with psychoactive substance-induced mood disorder: Secondary | ICD-10-CM | POA: Insufficient documentation

## 2023-04-27 DIAGNOSIS — F1410 Cocaine abuse, uncomplicated: Secondary | ICD-10-CM | POA: Insufficient documentation

## 2023-04-27 DIAGNOSIS — J45909 Unspecified asthma, uncomplicated: Secondary | ICD-10-CM | POA: Insufficient documentation

## 2023-04-27 DIAGNOSIS — F32A Depression, unspecified: Secondary | ICD-10-CM

## 2023-04-27 DIAGNOSIS — F17290 Nicotine dependence, other tobacco product, uncomplicated: Secondary | ICD-10-CM | POA: Insufficient documentation

## 2023-04-27 LAB — COMPREHENSIVE METABOLIC PANEL
ALT: 13 U/L (ref 0–44)
AST: 17 U/L (ref 15–41)
Albumin: 3.8 g/dL (ref 3.5–5.0)
Alkaline Phosphatase: 58 U/L (ref 38–126)
Anion gap: 8 (ref 5–15)
BUN: 15 mg/dL (ref 6–20)
CO2: 23 mmol/L (ref 22–32)
Calcium: 8.4 mg/dL — ABNORMAL LOW (ref 8.9–10.3)
Chloride: 109 mmol/L (ref 98–111)
Creatinine, Ser: 0.78 mg/dL (ref 0.61–1.24)
GFR, Estimated: >60 mL/min (ref >60)
Glucose, Bld: 105 mg/dL — ABNORMAL HIGH (ref 70–99)
Potassium: 3.7 mmol/L (ref 3.5–5.1)
Sodium: 140 mmol/L (ref 135–145)
Total Bilirubin: 0.5 mg/dL (ref 0.0–1.2)
Total Protein: 6.9 g/dL (ref 6.5–8.1)

## 2023-04-27 LAB — CBC WITH DIFFERENTIAL/PLATELET
Abs Immature Granulocytes: 0.02 10*3/uL (ref 0.00–0.07)
Basophils Absolute: 0 10*3/uL (ref 0.0–0.1)
Basophils Relative: 1 %
Eosinophils Absolute: 0.1 10*3/uL (ref 0.0–0.5)
Eosinophils Relative: 2 %
HCT: 41 % (ref 39.0–52.0)
Hemoglobin: 13.9 g/dL (ref 13.0–17.0)
Immature Granulocytes: 0 %
Lymphocytes Relative: 35 %
Lymphs Abs: 2 10*3/uL (ref 0.7–4.0)
MCH: 31 pg (ref 26.0–34.0)
MCHC: 33.9 g/dL (ref 30.0–36.0)
MCV: 91.3 fL (ref 80.0–100.0)
Monocytes Absolute: 0.4 10*3/uL (ref 0.1–1.0)
Monocytes Relative: 7 %
Neutro Abs: 3.1 10*3/uL (ref 1.7–7.7)
Neutrophils Relative %: 55 %
Platelets: 305 10*3/uL (ref 150–400)
RBC: 4.49 MIL/uL (ref 4.22–5.81)
RDW: 15.5 % (ref 11.5–15.5)
WBC: 5.6 10*3/uL (ref 4.0–10.5)
nRBC: 0 % (ref 0.0–0.2)

## 2023-04-27 LAB — RAPID URINE DRUG SCREEN, HOSP PERFORMED
Amphetamines: NOT DETECTED
Barbiturates: NOT DETECTED
Benzodiazepines: NOT DETECTED
Cocaine: POSITIVE — AB
Opiates: NOT DETECTED
Tetrahydrocannabinol: NOT DETECTED

## 2023-04-27 LAB — URINALYSIS, ROUTINE W REFLEX MICROSCOPIC
Bilirubin Urine: NEGATIVE
Glucose, UA: NEGATIVE mg/dL
Hgb urine dipstick: NEGATIVE
Ketones, ur: NEGATIVE mg/dL
Leukocytes,Ua: NEGATIVE
Nitrite: NEGATIVE
Protein, ur: NEGATIVE mg/dL
Specific Gravity, Urine: 1.026 (ref 1.005–1.030)
pH: 6 (ref 5.0–8.0)

## 2023-04-27 LAB — ETHANOL: Alcohol, Ethyl (B): <10 mg/dL (ref <10)

## 2023-04-27 MED ORDER — SERTRALINE HCL 100 MG PO TABS: 200.0000 mg | ORAL_TABLET | Freq: Every day | ORAL | 1 refills | Status: DC
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
## 2023-04-27 NOTE — ED Notes (Signed)
 Pts clothes, phone, and keys are in locker number 1.

## 2023-04-27 NOTE — ED Triage Notes (Signed)
 Patient from home for detox from alcohol and cocaine. Last alcoholic beverage was 3/5 morning; last cocaine use was 3/5 morning. Patient also states his face is swelling because he thinks he has fluid build up. Patient also reports being depressed. Patient denies SI/HI. Upon arrival to ER, patient is alert and oriented, ambu

## 2023-04-27 NOTE — BH Assessment (Signed)
 Referred this patient for Initial Assessment to [facility] @ 9:30 am. First available appointment time is 10:15 per Coordinator.

## 2023-04-27 NOTE — ED Notes (Signed)
Pt being TTS at this time  

## 2023-04-27 NOTE — Discharge Instructions (Signed)
Follow-up as instructed by behavioral health 
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
## 2023-04-28 ENCOUNTER — Encounter: Payer: Self-pay | Admitting: Family Medicine

## 2023-04-28 ENCOUNTER — Ambulatory Visit (INDEPENDENT_AMBULATORY_CARE_PROVIDER_SITE_OTHER): Payer: Medicaid Other | Admitting: Family Medicine

## 2023-04-28 VITALS — BP 123/74 | HR 80 | Temp 98.5°F | Ht 65.0 in | Wt 268.0 lb

## 2023-04-28 DIAGNOSIS — F322 Major depressive disorder, single episode, severe without psychotic features: Secondary | ICD-10-CM | POA: Diagnosis not present

## 2023-04-28 DIAGNOSIS — F1410 Cocaine abuse, uncomplicated: Secondary | ICD-10-CM | POA: Diagnosis not present

## 2023-04-28 DIAGNOSIS — G4733 Obstructive sleep apnea (adult) (pediatric): Secondary | ICD-10-CM

## 2023-04-28 DIAGNOSIS — E559 Vitamin D deficiency, unspecified: Secondary | ICD-10-CM | POA: Diagnosis not present

## 2023-04-28 DIAGNOSIS — F10288 Alcohol dependence with other alcohol-induced disorder: Secondary | ICD-10-CM

## 2023-04-28 MED ORDER — SEMAGLUTIDE-WEIGHT MANAGEMENT 1 MG/0.5ML ~~LOC~~ SOAJ
1.0000 mg | SUBCUTANEOUS | 2 refills | Status: DC
Start: 2023-04-28 — End: 2023-05-01

## 2023-04-28 NOTE — Patient Instructions (Addendum)
 F/u in  8 weeks, call if yu nee me sooner  I have referred you to behavioral health urgently, very important you follow upon this  Start taking zoloft eery day  Reginal Lutes is prescribed for weight management  Thanks for choosing [facility], we consider it a privelige to serve you.

## 2023-05-01 ENCOUNTER — Telehealth: Payer: Self-pay

## 2023-05-01 ENCOUNTER — Encounter: Payer: Self-pay | Admitting: Family Medicine

## 2023-05-01 DIAGNOSIS — F1410 Cocaine abuse, uncomplicated: Secondary | ICD-10-CM | POA: Insufficient documentation

## 2023-05-01 DIAGNOSIS — F322 Major depressive disorder, single episode, severe without psychotic features: Secondary | ICD-10-CM | POA: Insufficient documentation

## 2023-05-01 DIAGNOSIS — F10288 Alcohol dependence with other alcohol-induced disorder: Secondary | ICD-10-CM | POA: Insufficient documentation

## 2023-05-01 MED ORDER — TIRZEPATIDE-WEIGHT MANAGEMENT 5 MG/0.5ML ~~LOC~~ SOAJ: 5.0000 mg | SUBCUTANEOUS | 0 refills | Status: DC

## 2023-05-01 NOTE — Assessment & Plan Note (Signed)
 Presented to ED with c/o severe depression, went throiugh fromal psych eval and deemed safe, no risk to  self or to anyone

## 2023-05-01 NOTE — Assessment & Plan Note (Signed)
  Patient re-educated about  the importance of commitment to a  minimum of 150 minutes of exercise per week as able.  The importance of healthy food choices with portion control discussed, as well as eating regularly and within a 12 hour window most days. The need to choose "clean , green" food 50 to 75% of the time is discussed, as well as to make water the primary drink and set a goal of 64 ounces water daily.       04/28/2023    3:30 PM 04/27/2023    2:04 AM 04/07/2023    2:22 AM  Weight /BMI  Weight 268 lb 274 lb 6.4 oz 262 lb  Height 5\' 5"  (1.651 m) 5\' 5"  (1.651 m) 5\' 5"  (1.651 m)  BMI 44.6 kg/m2 45.66 kg/m2 43.6 kg/m2   With co morbidity of OSA will try to get zepbound authorized

## 2023-05-01 NOTE — Telephone Encounter (Signed)
 Copied from CRM [id number]. Topic: Clinical - Prescription Issue >> Apr 28, 2023  4:48 PM Shelah Lewandowsky wrote: Reason for CRM: Semaglutide-Weight Management 1 MG/0.5ML Hadley Pen with [facility], need to clarify the dosage, [phone]

## 2023-05-01 NOTE — Assessment & Plan Note (Signed)
 Ready for and needs rehab/ttreatment program, will try to ensure follow throug with out pt conact info provised

## 2023-05-01 NOTE — Assessment & Plan Note (Signed)
 Importance of compliance is discussed , he is followed by pulmonary

## 2023-05-01 NOTE — Assessment & Plan Note (Signed)
 Wants out pt addiction treatment , needs this,  multiple ED visits in past 4 months, looking for help

## 2023-05-01 NOTE — Progress Notes (Signed)
 Shawn Ramsey     MRN: [id number]      DOB: 1980-11-24  Chief Complaint  Patient presents with   Follow-up    6 week follow up    HPI Shawn Ramsey is here for follow up and re-evaluation of chronic medical conditions, medication management and review of any available recent lab and radiology data.  Was in the ED 1 day ago, with c/o depression, not suicidal or homicidal, did not meet criteria for hospitalization, multiple attempts by pt to get inpatient rehab for cocaine and alcohol addiction, has a trach and this is a barrier, atates now wantin out pt rehab Advised to f/u here with PCP, we do have integrative behavioral health in the office for depression and may be able to facilitate out pt rehab ROS Denies recent fever or chills. Denies sinus pressure, nasal congestion, ear pain or sore throat. Denies chest congestion, productive cough or wheezing. Denies chest pains, palpitations and leg swelling Denies abdominal pain, nausea, vomiting,diarrhea or constipation.   Denies dysuria, frequency, hesitancy or incontinence. Denies headaches, seizures, numbness, or tingling. . Denies skin break down or rash.   PE  BP 123/74   Pulse 80   Temp 98.5 F (36.9 C)   Ht 5\' 5"  (1.651 m)   Wt 268 lb (121.6 kg)   SpO2 96%   BMI 44.60 kg/m   Patient alert and oriented and in no cardiopulmonary distress.  HEENT: No facial asymmetry, EOMI,     Neck supple .  Chest: Clear to auscultation bilaterally.  CVS: S1, S2 no murmurs, no S3.Regular rate.  ABD: Soft non tender.   Ext: No edema  MS: Decreased though Adequate ROM spine, shoulders, hips and knees.  Skin: Intact, no ulcerations or rash noted.  Psych: Good eye contact, normal affect. Memory intact not anxious or depressed appearing.  CNS: CN 2-12 intact, power,  normal throughout.no focal deficits noted.   Assessment & Plan  Alcohol dependence with other alcohol-induced disorder (HCC) Wants out pt addiction treatment ,  needs this,  multiple ED visits in past 4 months, looking for help  Cocaine abuse (HCC) Ready for and needs rehab/ttreatment program, will try to ensure follow throug with out pt conact info provised   Depression, major, single episode, moderate (HCC) Presented to ED with c/o severe depression, went throiugh fromal psych eval and deemed safe, no risk to  self or to anyone  Vitamin D deficiency Corrected , normal level in 02/2023  OSA (obstructive sleep apnea) Importance of compliance is discussed , he is followed by pulmonary  Morbid obesity due to excess calories [facility])  Patient re-educated about  the importance of commitment to a  minimum of 150 minutes of exercise per week as able.  The importance of healthy food choices with portion control discussed, as well as eating regularly and within a 12 hour window most days. The need to choose "clean , green" food 50 to 75% of the time is discussed, as well as to make water the primary drink and set a goal of 64 ounces water daily.       04/28/2023    3:30 PM 04/27/2023    2:04 AM 04/07/2023    2:22 AM  Weight /BMI  Weight 268 lb 274 lb 6.4 oz 262 lb  Height 5\' 5"  (1.651 m) 5\' 5"  (1.651 m) 5\' 5"  (1.651 m)  BMI 44.6 kg/m2 45.66 kg/m2 43.6 kg/m2   With co morbidity of OSA will try to get zepbound  authorized

## 2023-05-01 NOTE — Assessment & Plan Note (Signed)
 Corrected , normal level in 02/2023

## 2023-05-02 ENCOUNTER — Other Ambulatory Visit (HOSPITAL_COMMUNITY): Payer: Self-pay

## 2023-05-03 ENCOUNTER — Telehealth: Payer: Self-pay | Admitting: Family Medicine

## 2023-05-03 ENCOUNTER — Telehealth: Payer: Self-pay | Admitting: Pharmacy Technician

## 2023-05-03 NOTE — Telephone Encounter (Signed)
 Copied from CRM [id number]. Topic: Referral - Status >> May 03, 2023  3:29 PM Nyra Capes wrote: Reason for CRM: Patient is calling in asking about referral for Alcohol and drugs rehab patient would like inpatient care

## 2023-05-03 NOTE — Telephone Encounter (Signed)
 Pharmacy Patient Advocate Encounter   Received notification from Patient Pharmacy that prior authorization for ZEPBOUND 5MG /0.5ML PEN is required/requested.   Insurance verification completed.   The patient is insured through [facility] MEDICAID .   Per test claim:  WEGOVY is preferred by the insurance.  If suggested medication is appropriate, Please send in a new RX and discontinue this one. If not, please advise as to why it's not appropriate so that we may request a Prior Authorization. Please note, some preferred medications may still require a PA.  If the suggested medications have not been trialed and there are no contraindications to their use, the PA will not be submitted, as it will not be approved.  Pt will meet the coverage criteria for [facility] and he has to try/fail Wegovy before Zepbound can be approved. Can order to be changed and then I will submit PA for [facility]?

## 2023-05-04 NOTE — Telephone Encounter (Signed)
 Called and spoke with pt and gave him contact info for a local outpt drug rehab

## 2023-05-04 NOTE — Telephone Encounter (Signed)
 Called pt and with his permission, gave the clinic rehab info to his mother. She has the name, address and phone number and was told they take walk ins until 3 on the weekdays. She states she will reach out

## 2023-05-05 NOTE — Telephone Encounter (Signed)
 Can he be prescribed wegovy- its preferred on his plan per the PA team

## 2023-05-05 NOTE — Telephone Encounter (Signed)
 Does patient still need the weight loss medication? If so can he be prescribed Wegovy?

## 2023-05-11 ENCOUNTER — Ambulatory Visit: Payer: Self-pay | Admitting: *Deleted

## 2023-05-11 MED ORDER — WEGOVY 0.5 MG/0.5ML ~~LOC~~ SOAJ: 0.5000 mg | SUBCUTANEOUS | 1 refills | Status: DC

## 2023-05-11 MED ORDER — WEGOVY 0.25 MG/0.5ML ~~LOC~~ SOAJ: 0.2500 mg | SUBCUTANEOUS | 0 refills | Status: DC

## 2023-05-11 NOTE — Patient Instructions (Signed)
 Visit Information  Thank you for taking time to visit with me today. Please don't hesitate to contact me if I can be of assistance to you.   Following are the goals we discussed today:   Goals Addressed               This Visit's Progress     COMPLETED: Receive Assistance Pursuing Inpatient Alcohol & Drug Rehabilitation Treatment Services. (pt-stated)   On track     Care Coordination Interventions:  Interventions Today    Flowsheet Row Most Recent Value  Chronic Disease   Chronic disease during today's visit Hypertension (HTN), Diabetes, Other  [Tracheostomy, Alcohol Consumpion Binge Drinking, Chronic Pain, Cigarette Nicotine Dependence, Depression, Insomnia, Metabolic Syndrome X, Morbid Obesity, Substance Abuse, Vitamin D Deficiency, Hyperlipidemia, Requesting Inpatient Alcohol & Drug Treatment]  General Interventions   General Interventions Discussed/Reviewed General Interventions Discussed, General Interventions Reviewed, Annual Eye Exam, Durable Medical Equipment (DME), Lipid Profile, Annual Foot Exam, Labs, Vaccines, Doctor Visits, Level of Care, [employer], Communication with, Health Screening  [Encouraged Routine Engagement with Care Team Members & Providers.]  Labs Hgb A1c every 3 months, Kidney Function, Hgb A1c annually  [Encouraged Routine Lab Work.]  Vaccines COVID-19, Flu, Pneumonia, RSV, Shingles, Tetanus/Pertussis/Diphtheria  [Encouraged Routine Vaccinations.]  Doctor Visits Discussed/Reviewed Doctor Visits Discussed, Specialist, Doctor Visits Reviewed, Annual Wellness Visits, PCP  [Encouraged Routine Engagement with Care Team Members & Providers.]  Health Screening Bone Density, Prostate, Colonoscopy  [Encouraged Routine Health Screenings.]  Durable Medical Equipment (DME) Other, Glucomoter, BP Cuff, Walker, Public librarian, Physiological scientist, Scales.]  Wheelchair Standard  PCP/Specialist Visits Compliance with follow-up visit  [Encouraged Routine  Engagement with Care Team Members & Providers.]  Communication with PCP/Specialists, Charity fundraiser, Pharmacists, Social Work  [employer] Routine Engagement with Care Team Members & Providers.]  Level of Care Adult Daycare, Air traffic controller, Assisted Living, Skilled Nursing Facility  [Confirmed Disinterest in Enrollment in Adult Day Care Program. Confirmed Disinterest in Higher Level of Care Placement Options. Confirmed Interest in Pursuing Inpatient Alcohol & Drug Treatment Services.]  Applications Medicaid, Personal Care Services  [Confirmed Active Adult Medicaid Status. Confirmed Disinterest in Applying for Personal Care Services.]  Exercise Interventions   Exercise Discussed/Reviewed Exercise Discussed, Assistive device use and maintanence, Weight Managment, Physical Activity, Exercise Reviewed  [Encouraged Daily Exercise Regimen, as Tolerated.]  Physical Activity Discussed/Reviewed Physical Activity Discussed, Home Exercise Program (HEP), PREP, Physical Activity Reviewed, Gym, Types of exercise  [Encouraged Increased Level of Activity & Exercise, Inside & Outside the Home.]  Weight Management Weight loss  [Encouraged Healthy Diet.]  Education Interventions   Education Provided Provided Therapist, sports, Provided Web-based Education, Provided Education  [employer] Reviewed Educational Material to [employer] Questions.]  Provided Verbal Education On Nutrition, Mental Health/Coping with Illness, When to see the doctor, Foot Care, Eye Care, Applications, Labs, Blood Sugar Monitoring, Exercise, Medication, [employer], Development worker, community  [Encouraged Continued Independent Review of Educational Material Provided.]  Ship broker, Personal Care Services  [Confirmed Active Adult Medicaid Status. Confirmed Disinterest in Applying for Personal Care Services.]  Mental Health Interventions   Mental Health Discussed/Reviewed Mental Health Discussed, Anxiety, Depression, Mental Health  Reviewed, Grief and Loss, Substance Abuse, Coping Strategies, Suicide, Crisis, Other  [Assessed Mental Health & Cognitive Status. Offered Counseling & Supportive Services.]  Nutrition Interventions   Nutrition Discussed/Reviewed Nutrition Discussed, Adding fruits and vegetables, Increasing proteins, Decreasing fats, Nutrition Reviewed, Fluid intake, Decreasing salt, Carbohydrate meal planning, Portion sizes, Decreasing sugar intake  [Encouraged Healthy Diet.]  Pharmacy  Interventions   Pharmacy Dicussed/Reviewed Pharmacy Topics Discussed, Medications and their functions, Medication Adherence, Pharmacy Topics Reviewed, Affording Medications  [Confirmed Ability to Afford Prescription Medications.]  Medication Adherence --  [Confirmed Compliance with Prescription Medications.]  Safety Interventions   Safety Discussed/Reviewed Safety Discussed, Safety Reviewed  [Encouraged Routine Use of Assistive Devices & Durable Medical Equipment.]  Advanced Directive Interventions   Advanced Directives Discussed/Reviewed Advanced Directives Discussed, Advanced Directives Reviewed  [Encouraged Initiation of Advanced Directives (Living Will & Healthcare Power of Corporate treasurer), Offering to [employer], Assist with Completion, Make Copies, & Scan into Electronic Medical Record in Epic.]      Active Listening & Reflection Utilized.  Verbalization of Feelings Encouraged.  Emotional Support Provided. Acceptance & Commitment Therapy Initiated. Cognitive Behavioral Therapy Performed. Client-Centered Therapy Implemented. Encouraged Engagement with Danford Bad, Licensed Clinical Social Worker with [facility], [facility] [phone]), if You Have Questions, Need Assistance, Additional Social Work Needs Are Identified in The Near Future, or If You Change Your Mind About Wanting to Receive Social Work Services.      Please call the care guide team at [phone] if you need  to cancel or reschedule your appointment.   If you are experiencing a Mental Health or Behavioral Health Crisis or need someone to talk to, please call the Suicide and Crisis Lifeline: 988 call the [place] National Suicide Prevention Lifeline: 575-674-7782 or TTY: 2263064598 TTY (306)650-4520) to talk to a trained counselor call 1-800-273-TALK (toll free, 24 hour hotline) go to [facility] [address], [place] [phone]) call the [facility] Crisis Line: [phone] call 911  Patient verbalizes understanding of instructions and care plan provided today and agrees to view in MyChart. Active MyChart status and patient understanding of how to access instructions and care plan via MyChart confirmed with patient.     No further follow up required.   Danford Bad, BSW, MSW, LCSW [facility], [facility] Clinical Social Worker II Direct Dial: [phone]  Fax: [phone] Website: [email or web address]

## 2023-05-11 NOTE — Telephone Encounter (Signed)
 Shawn Ramsey has been prescribed for the  patient  for weight loss please let him know

## 2023-05-11 NOTE — Patient Outreach (Signed)
 Care Coordination   Follow Up Visit Note   05/11/2023  Name: Shawn Ramsey MRN: [id number] DOB: [birth year]  Shawn Ramsey is a 43 y.o. year old male who sees Kerri Perches, MD for primary care. I spoke with patient's mother, Shawn Ramsey by phone today.  What matters to the patients health and wellness today?  Receive Assistance Pursuing Inpatient Alcohol & Drug Rehabilitation Treatment Services.    Goals Addressed               This Visit's Progress     COMPLETED: Receive Assistance Pursuing Inpatient Alcohol & Drug Rehabilitation Treatment Services. (pt-stated)   On track     Care Coordination Interventions:  Interventions Today    Flowsheet Row Most Recent Value  Chronic Disease   Chronic disease during today's visit Hypertension (HTN), Diabetes, Other  [Tracheostomy, Alcohol Consumpion Binge Drinking, Chronic Pain, Cigarette Nicotine Dependence, Depression, Insomnia, Metabolic Syndrome X, Morbid Obesity, Substance Abuse, Vitamin D Deficiency, Hyperlipidemia, Requesting Inpatient Alcohol & Drug Treatment]  General Interventions   General Interventions Discussed/Reviewed General Interventions Discussed, General Interventions Reviewed, Annual Eye Exam, Durable Medical Equipment (DME), Lipid Profile, Annual Foot Exam, Labs, Vaccines, Doctor Visits, Level of Care, [employer], Communication with, Health Screening  [Encouraged Routine Engagement with Care Team Members & Providers.]  Labs Hgb A1c every 3 months, Kidney Function, Hgb A1c annually  [Encouraged Routine Lab Work.]  Vaccines COVID-19, Flu, Pneumonia, RSV, Shingles, Tetanus/Pertussis/Diphtheria  [Encouraged Routine Vaccinations.]  Doctor Visits Discussed/Reviewed Doctor Visits Discussed, Specialist, Doctor Visits Reviewed, Annual Wellness Visits, PCP  [Encouraged Routine Engagement with Care Team Members & Providers.]  Health Screening Bone Density, Prostate, Colonoscopy  [Encouraged Routine Health  Screenings.]  Durable Medical Equipment (DME) Other, Glucomoter, BP Cuff, Walker, Public librarian, Physiological scientist, Scales.]  Wheelchair Standard  PCP/Specialist Visits Compliance with follow-up visit  [Encouraged Routine Engagement with Care Team Members & Providers.]  Communication with PCP/Specialists, Charity fundraiser, Pharmacists, Social Work  [employer] Routine Engagement with Care Team Members & Providers.]  Level of Care Adult Daycare, Air traffic controller, Assisted Living, Skilled Nursing Facility  [Confirmed Disinterest in Enrollment in Adult Day Care Program. Confirmed Disinterest in Higher Level of Care Placement Options. Confirmed Interest in Pursuing Inpatient Alcohol & Drug Treatment Services.]  Applications Medicaid, Personal Care Services  [Confirmed Active Adult Medicaid Status. Confirmed Disinterest in Applying for Personal Care Services.]  Exercise Interventions   Exercise Discussed/Reviewed Exercise Discussed, Assistive device use and maintanence, Weight Managment, Physical Activity, Exercise Reviewed  [Encouraged Daily Exercise Regimen, as Tolerated.]  Physical Activity Discussed/Reviewed Physical Activity Discussed, Home Exercise Program (HEP), PREP, Physical Activity Reviewed, Gym, Types of exercise  [Encouraged Increased Level of Activity & Exercise, Inside & Outside the Home.]  Weight Management Weight loss  [Encouraged Healthy Diet.]  Education Interventions   Education Provided Provided Therapist, sports, Provided Web-based Education, Provided Education  [employer] Reviewed Educational Material to [employer] Questions.]  Provided Verbal Education On Nutrition, Mental Health/Coping with Illness, When to see the doctor, Foot Care, Eye Care, Applications, Labs, Blood Sugar Monitoring, Exercise, Medication, [employer], Development worker, community  [Encouraged Continued Independent Review of Educational Material Provided.]  Ship broker, Personal Care  Services  [Confirmed Active Adult Medicaid Status. Confirmed Disinterest in Applying for Personal Care Services.]  Mental Health Interventions   Mental Health Discussed/Reviewed Mental Health Discussed, Anxiety, Depression, Mental Health Reviewed, Grief and Loss, Substance Abuse, Coping Strategies, Suicide, Crisis, Other  [Assessed Mental Health & Cognitive Status. Offered Counseling & Supportive Services.]  Nutrition Interventions   Nutrition Discussed/Reviewed Nutrition Discussed, Adding fruits and vegetables, Increasing proteins, Decreasing fats, Nutrition Reviewed, Fluid intake, Decreasing salt, Carbohydrate meal planning, Portion sizes, Decreasing sugar intake  [Encouraged Healthy Diet.]  Pharmacy Interventions   Pharmacy Dicussed/Reviewed Pharmacy Topics Discussed, Medications and their functions, Medication Adherence, Pharmacy Topics Reviewed, Affording Medications  [Confirmed Ability to [employer] Prescription Medications.]  Medication Adherence --  [Confirmed Compliance with Prescription Medications.]  Safety Interventions   Safety Discussed/Reviewed Safety Discussed, Safety Reviewed  [Encouraged Routine Use of Assistive Devices & Durable Medical Equipment.]  Advanced Directive Interventions   Advanced Directives Discussed/Reviewed Advanced Directives Discussed, Advanced Directives Reviewed  [Encouraged Initiation of Advanced Directives (Living Will & Healthcare Power of Corporate treasurer), Offering to [employer], Assist with Completion, Make Copies, & Scan into Electronic Medical Record in Epic.]      Active Listening & Reflection Utilized.  Verbalization of Feelings Encouraged.  Emotional Support Provided. Acceptance & Commitment Therapy Initiated. Cognitive Behavioral Therapy Performed. Client-Centered Therapy Implemented. Encouraged Engagement with Danford Bad, Licensed Clinical Social Worker with [facility], [facility] [phone]), if You  Have Questions, Need Assistance, Additional Social Work Needs Are Identified in The Near Future, or If You Change Your Mind About Wanting to Receive Social Work Services.      SDOH assessments and interventions completed:  Yes.  Care Coordination Interventions:  Yes, provided.   Follow up plan: No further intervention required.   Encounter Outcome:  Patient Visit Completed.

## 2023-05-12 ENCOUNTER — Telehealth: Payer: Self-pay | Admitting: Pharmacy Technician

## 2023-05-12 ENCOUNTER — Other Ambulatory Visit (HOSPITAL_COMMUNITY): Payer: Self-pay

## 2023-05-12 NOTE — Telephone Encounter (Signed)
 Pharmacy Patient Advocate Encounter   Received notification from CoverMyMeds that prior authorization for [facility] 2 0.25MG /0.5ML AUTO-INJECTORS is required/requested.   Insurance verification completed.   The patient is insured through [facility] MEDICAID .   Per test claim: PA required; PA submitted to above mentioned insurance via Fax Key/confirmation #/EOC   Status is pending

## 2023-05-15 ENCOUNTER — Other Ambulatory Visit: Payer: Self-pay | Admitting: Family Medicine

## 2023-05-15 ENCOUNTER — Other Ambulatory Visit (HOSPITAL_COMMUNITY): Payer: Self-pay

## 2023-05-15 ENCOUNTER — Telehealth: Payer: Self-pay

## 2023-05-15 NOTE — Telephone Encounter (Signed)
 Pharmacy Patient Advocate Encounter  Received notification from [facility] MEDICAID that Prior Authorization for [employer] 0.25MG /0.5ML AUTO-INJECTORS has been APPROVED from 03/21/205 to 11/08/2023.   PA #/Case ID/Reference #: [id number]

## 2023-05-22 ENCOUNTER — Ambulatory Visit (HOSPITAL_BASED_OUTPATIENT_CLINIC_OR_DEPARTMENT_OTHER): Payer: Medicaid Other | Admitting: Pulmonary Disease

## 2023-05-23 NOTE — Telephone Encounter (Signed)
error 

## 2023-05-29 ENCOUNTER — Other Ambulatory Visit: Payer: Self-pay | Admitting: Family Medicine

## 2023-06-08 ENCOUNTER — Ambulatory Visit (INDEPENDENT_AMBULATORY_CARE_PROVIDER_SITE_OTHER): Admitting: Podiatry

## 2023-06-08 DIAGNOSIS — Z91199 Patient's noncompliance with other medical treatment and regimen due to unspecified reason: Secondary | ICD-10-CM

## 2023-06-09 NOTE — Progress Notes (Signed)
   Complete physical exam  Patient: Shawn Ramsey   DOB: 12/11/1998   43 y.o. Male  MRN: [id number]  Subjective:    No chief complaint on file.   Shawn Ramsey is a 43 y.o. male who presents today for a complete physical exam. She reports consuming a {diet types:17450} diet. {types:19826} She generally feels {DESC; WELL/FAIRLY WELL/POORLY:18703}. She reports sleeping {DESC; WELL/FAIRLY WELL/POORLY:18703}. She {does/does not:200015} have additional problems to discuss today.    Most recent fall risk assessment:    08/18/2021   10:42 AM  Fall Risk   Falls in the past year? 0  Number falls in past yr: 0  Injury with Fall? 0  Risk for fall due to : No Fall Risks  Follow up Falls evaluation completed     Most recent depression screenings:    08/18/2021   10:42 AM 07/09/2020   10:46 AM  PHQ 2/9 Scores  PHQ - 2 Score 0 0  PHQ- 9 Score 5     {VISON DENTAL STD PSA (Optional):27386}  {History (Optional):23778}  Patient Care Team: Christen Butter, NP as PCP - General (Nurse Practitioner)   Outpatient Medications Prior to Visit  Medication Sig   fluticasone (FLONASE) 50 MCG/ACT nasal spray Place 2 sprays into both nostrils in the morning and at bedtime. After 7 days, reduce to once daily.   norgestimate-ethinyl estradiol (SPRINTEC 28) 0.25-35 MG-MCG tablet Take 1 tablet by mouth daily.   Nystatin POWD Apply liberally to affected area 2 times per day   spironolactone (ALDACTONE) 100 MG tablet Take 1 tablet (100 mg total) by mouth daily.   No facility-administered medications prior to visit.    ROS        Objective:     There were no vitals taken for this visit. {Vitals History (Optional):23777}  Physical Exam   No results found for any visits on 09/23/21. {Show previous labs (optional):23779}    Assessment & Plan:    Routine Health Maintenance and Physical Exam  Immunization History  Administered Date(s) Administered   DTaP 02/24/1999, 04/22/1999,  07/01/1999, 03/16/2000, 09/30/2003   Hepatitis A 07/27/2007, 08/01/2008   Hepatitis B 12/12/1998, 01/19/1999, 07/01/1999   HiB (PRP-OMP) 02/24/1999, 04/22/1999, 07/01/1999, 03/16/2000   IPV 02/24/1999, 04/22/1999, 12/20/1999, 09/30/2003   Influenza,inj,Quad PF,6+ Mos 11/01/2013   Influenza-Unspecified 02/01/2012   MMR 12/19/2000, 09/30/2003   Meningococcal Polysaccharide 08/01/2011   Pneumococcal Conjugate-13 03/16/2000   Pneumococcal-Unspecified 07/01/1999, 09/14/1999   Tdap 08/01/2011   Varicella 12/20/1999, 07/27/2007    Health Maintenance  Topic Date Due   HIV Screening  Never done   Hepatitis C Screening  Never done   INFLUENZA VACCINE  09/21/2021   PAP-Cervical Cytology Screening  09/23/2021 (Originally 12/11/2019)   PAP SMEAR-Modifier  09/23/2021 (Originally 12/11/2019)   TETANUS/TDAP  09/23/2021 (Originally 07/31/2021)   HPV VACCINES  Discontinued   COVID-19 Vaccine  Discontinued    Discussed health benefits of physical activity, and encouraged her to engage in regular exercise appropriate for her age and condition.  Problem List Items Addressed This Visit   None Visit Diagnoses     Annual physical exam    -  Primary   Cervical cancer screening       Need for Tdap vaccination          No follow-ups on file.     Christen Butter, NP

## 2023-06-19 ENCOUNTER — Ambulatory Visit (HOSPITAL_COMMUNITY)
Admission: RE | Admit: 2023-06-19 | Discharge: 2023-06-19 | Disposition: A | Discharge status: Home / Self Care | Source: Ambulatory Visit | Attending: Acute Care | Admitting: Acute Care

## 2023-06-19 DIAGNOSIS — Z430 Encounter for attention to tracheostomy: Secondary | ICD-10-CM | POA: Diagnosis present

## 2023-06-19 DIAGNOSIS — Z930 Tracheostomy status: Secondary | ICD-10-CM

## 2023-06-19 DIAGNOSIS — J45991 Cough variant asthma: Secondary | ICD-10-CM | POA: Diagnosis present

## 2023-06-19 DIAGNOSIS — G4733 Obstructive sleep apnea (adult) (pediatric): Secondary | ICD-10-CM | POA: Diagnosis present

## 2023-06-19 NOTE — Progress Notes (Signed)
 Reason for visit  Trach management/ possible decannulation   HPI Shawn Ramsey is well known to me. I follow him for trach management in the setting of MO w/ associated OHS and OSA. CPAP titration study on 12/3 thru [facility] study showing optimal positive pressure 15cmH2O on 12/3 but has really struggled w/ compliance w/ multiple reasons..mask fit, forgets etc..He has been working on losing wt. Just changed his medication for this. No distress. Having trouble still w/ mask fit and compliance w/ NIPPV   Review of Systems  All other systems reviewed and are negative.   Exam  General ambulatory no distress HENT 6 cuffless trach removed. Site unchanged Pulm clear  Card rrr Abd soft Ext warm  Neuro intact  Procedure Trach removed. Site inspected. Granulation tissue around stoma but otherwise unremarkable & unchanged  New trach placed over obturator w/out difficulty. Placement verified via ETCO2   Impression/plan Active Problems:   Tracheostomy status (HCC)   OSA (obstructive sleep apnea)   Cough variant asthma    Tracheostomy status (HCC) Overview: Followed at [facility].  Size 6 cuffless proximal XLT shiley Last change today 4/28 (previously 2/28)   Discussion His trach is stable and he is clear for decannulation IMO once he can prove he will use his CPAP regularly. His compliance continues being the primary barrier to decannulation again saying mask fit the issue   Plan ROV 8 weeks to assess compliance w/ CPAP and readiness for decannulation (I am doubtful that he will be) Cont routine trach care Cont routine trach change            My time 22 min   Hadley Leu ACNP-BC [facility] Pulmonary/Critical Care Pager # [phone] OR # [phone] if no answer

## 2023-06-19 NOTE — Progress Notes (Signed)
 Tracheostomy Procedure Note  Shawn Ramsey [id number] 1980/06/23  Pre Procedure Tracheostomy Information  Trach Brand: Shiley Size:  #6 [id number] Style: Uncuffed Secured by: Velcro   Procedure: Trach change/cleaning    Post Procedure Tracheostomy Information  Trach Brand: Shiley Size:  #6 [id number] Style: Uncuffed Secured by: Velcro   Post Procedure Evaluation:  ETCO2 positive color change from yellow to purple : Yes.   Vital signs: Stable throughout. Patients current condition: stable Complications: No apparent complications Trach site exam: clean, dry, no drainage Wound care done: dry, saline soaked, sterile, clean, and 4 x 4 gauze Patient did tolerate procedure well.   Education: Given by Burna Carrier, NP.  Prescription needs: None    Additional needs: New PMV sent home with pt.

## 2023-06-20 ENCOUNTER — Telehealth: Payer: Self-pay | Admitting: Acute Care

## 2023-06-20 DIAGNOSIS — G4733 Obstructive sleep apnea (adult) (pediatric): Secondary | ICD-10-CM

## 2023-06-21 ENCOUNTER — Other Ambulatory Visit (HOSPITAL_COMMUNITY): Payer: Self-pay

## 2023-06-21 ENCOUNTER — Telehealth: Payer: Self-pay

## 2023-06-21 NOTE — Telephone Encounter (Signed)
 Pharmacy Patient Advocate Encounter   Received notification from Pt Calls Messages that prior authorization for [employer]  0.5 is required/requested.   Insurance verification completed.   The patient is insured through [facility] MEDICAID .   Per test claim: Refill too soon. PA is not needed at this time. Medication was filled 06/05/23. Next eligible fill date is 06/25/23.

## 2023-06-22 NOTE — Telephone Encounter (Signed)
 Spoke with patient regarding .Advised patient I have placed a order for mask fitting. Patient's voice was understanding.Nothing else further needed.

## 2023-06-23 ENCOUNTER — Other Ambulatory Visit: Payer: Self-pay | Admitting: Family Medicine

## 2023-06-26 ENCOUNTER — Other Ambulatory Visit: Payer: Self-pay | Admitting: Family Medicine

## 2023-06-27 ENCOUNTER — Other Ambulatory Visit: Payer: Self-pay | Admitting: Family Medicine

## 2023-06-28 ENCOUNTER — Encounter: Payer: Self-pay | Admitting: Family Medicine

## 2023-06-28 ENCOUNTER — Ambulatory Visit (INDEPENDENT_AMBULATORY_CARE_PROVIDER_SITE_OTHER): Admitting: Family Medicine

## 2023-06-28 VITALS — BP 126/82 | HR 95 | Ht 65.0 in | Wt 275.0 lb

## 2023-06-28 DIAGNOSIS — I10 Essential (primary) hypertension: Secondary | ICD-10-CM

## 2023-06-28 DIAGNOSIS — R7303 Prediabetes: Secondary | ICD-10-CM

## 2023-06-28 DIAGNOSIS — E785 Hyperlipidemia, unspecified: Secondary | ICD-10-CM

## 2023-06-28 DIAGNOSIS — F1410 Cocaine abuse, uncomplicated: Secondary | ICD-10-CM | POA: Diagnosis not present

## 2023-06-28 DIAGNOSIS — F321 Major depressive disorder, single episode, moderate: Secondary | ICD-10-CM

## 2023-06-28 DIAGNOSIS — E1169 Type 2 diabetes mellitus with other specified complication: Secondary | ICD-10-CM

## 2023-06-28 DIAGNOSIS — F419 Anxiety disorder, unspecified: Secondary | ICD-10-CM

## 2023-06-28 DIAGNOSIS — Z131 Encounter for screening for diabetes mellitus: Secondary | ICD-10-CM

## 2023-06-28 MED ORDER — ONDANSETRON 4 MG PO TBDP: 4.0000 mg | ORAL_TABLET | Freq: Three times a day (TID) | ORAL | 0 refills | Status: DC | PRN

## 2023-06-28 MED ORDER — POTASSIUM CHLORIDE CRYS ER 20 MEQ PO TBCR: 20.0000 meq | EXTENDED_RELEASE_TABLET | Freq: Two times a day (BID) | ORAL | 3 refills | Status: AC

## 2023-06-28 MED ORDER — TORSEMIDE 20 MG PO TABS: 20.0000 mg | ORAL_TABLET | Freq: Two times a day (BID) | ORAL | 3 refills | Status: DC

## 2023-06-28 MED ORDER — SERTRALINE HCL 100 MG PO TABS: 200.0000 mg | ORAL_TABLET | Freq: Every day | ORAL | 3 refills | Status: AC

## 2023-06-28 NOTE — Patient Instructions (Addendum)
 F/U in 8 weeks, call if you need me sooner  Nurse pls score and enter depression and anxiety score,you are being referred to Psych  Nurse pls check and document bP, hR and pulse ox  Lipid , cmp and EGFR hBA1C fasting 3 to 5 days before next visit  Thanks for choosing [facility], we consider it a privelige to serve you.

## 2023-06-29 ENCOUNTER — Telehealth: Payer: Self-pay

## 2023-06-29 NOTE — Telephone Encounter (Signed)
 Copied from CRM [id number]. Topic: General - Other >> Jun 29, 2023  8:59 AM Emylou G wrote: Reason for CRM: Patient called.. needs referral for his trachea to [facility]?  Pls call for questions

## 2023-06-30 ENCOUNTER — Encounter (HOSPITAL_COMMUNITY): Payer: Self-pay | Admitting: *Deleted

## 2023-06-30 ENCOUNTER — Emergency Department (HOSPITAL_COMMUNITY)
Admission: EM | Admit: 2023-06-30 | Discharge: 2023-07-01 | Disposition: A | Discharge status: Home / Self Care | Attending: Emergency Medicine | Admitting: Emergency Medicine

## 2023-06-30 ENCOUNTER — Other Ambulatory Visit: Payer: Self-pay

## 2023-06-30 DIAGNOSIS — Z8616 Personal history of COVID-19: Secondary | ICD-10-CM | POA: Insufficient documentation

## 2023-06-30 DIAGNOSIS — F1410 Cocaine abuse, uncomplicated: Secondary | ICD-10-CM | POA: Insufficient documentation

## 2023-06-30 DIAGNOSIS — F1990 Other psychoactive substance use, unspecified, uncomplicated: Secondary | ICD-10-CM

## 2023-06-30 DIAGNOSIS — F1010 Alcohol abuse, uncomplicated: Secondary | ICD-10-CM | POA: Insufficient documentation

## 2023-06-30 DIAGNOSIS — J4489 Other specified chronic obstructive pulmonary disease: Secondary | ICD-10-CM | POA: Insufficient documentation

## 2023-06-30 DIAGNOSIS — E119 Type 2 diabetes mellitus without complications: Secondary | ICD-10-CM | POA: Diagnosis not present

## 2023-06-30 LAB — CBC
HCT: 41.9 % (ref 39.0–52.0)
Hemoglobin: 14.2 g/dL (ref 13.0–17.0)
MCH: 30.9 pg (ref 26.0–34.0)
MCHC: 33.9 g/dL (ref 30.0–36.0)
MCV: 91.1 fL (ref 80.0–100.0)
Platelets: 284 10*3/uL (ref 150–400)
RBC: 4.6 MIL/uL (ref 4.22–5.81)
RDW: 14.4 % (ref 11.5–15.5)
WBC: 4.9 10*3/uL (ref 4.0–10.5)
nRBC: 0 % (ref 0.0–0.2)

## 2023-06-30 LAB — URINALYSIS, ROUTINE W REFLEX MICROSCOPIC
Bilirubin Urine: NEGATIVE
Glucose, UA: NEGATIVE mg/dL
Hgb urine dipstick: NEGATIVE
Ketones, ur: NEGATIVE mg/dL
Leukocytes,Ua: NEGATIVE
Nitrite: NEGATIVE
Protein, ur: NEGATIVE mg/dL
Specific Gravity, Urine: 1.026 (ref 1.005–1.030)
pH: 7 (ref 5.0–8.0)

## 2023-06-30 LAB — RAPID URINE DRUG SCREEN, HOSP PERFORMED
Amphetamines: NOT DETECTED
Barbiturates: NOT DETECTED
Benzodiazepines: NOT DETECTED
Cocaine: POSITIVE — AB
Opiates: NOT DETECTED
Tetrahydrocannabinol: NOT DETECTED

## 2023-06-30 NOTE — ED Triage Notes (Signed)
 The pt wants to  be detoxed  from alcohol  and drugs   using cocaine  earlier today alcohol was last used 2 days

## 2023-07-01 LAB — COMPREHENSIVE METABOLIC PANEL WITH GFR
ALT: 47 U/L — ABNORMAL HIGH (ref 0–44)
AST: 29 U/L (ref 15–41)
Albumin: 3.5 g/dL (ref 3.5–5.0)
Alkaline Phosphatase: 57 U/L (ref 38–126)
Anion gap: 8 (ref 5–15)
BUN: 19 mg/dL (ref 6–20)
CO2: 24 mmol/L (ref 22–32)
Calcium: 9.1 mg/dL (ref 8.9–10.3)
Chloride: 110 mmol/L (ref 98–111)
Creatinine, Ser: 0.78 mg/dL (ref 0.61–1.24)
GFR, Estimated: >60 mL/min (ref >60)
Glucose, Bld: 94 mg/dL (ref 70–99)
Potassium: 4.2 mmol/L (ref 3.5–5.1)
Sodium: 142 mmol/L (ref 135–145)
Total Bilirubin: 0.3 mg/dL (ref 0.0–1.2)
Total Protein: 7.2 g/dL (ref 6.5–8.1)

## 2023-07-01 LAB — LIPASE, BLOOD: Lipase: 49 U/L (ref 11–51)

## 2023-07-01 LAB — ETHANOL: Alcohol, Ethyl (B): <15 mg/dL (ref <15)

## 2023-07-01 NOTE — ED Notes (Signed)
 Pt care taken, resting, wants something to eat. Said that his legs are swelling and he has not taken his meds in over a week.

## 2023-07-01 NOTE — Discharge Instructions (Signed)
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
## 2023-07-03 ENCOUNTER — Other Ambulatory Visit (HOSPITAL_BASED_OUTPATIENT_CLINIC_OR_DEPARTMENT_OTHER)

## 2023-07-05 ENCOUNTER — Encounter: Payer: Self-pay | Admitting: Family Medicine

## 2023-07-05 DIAGNOSIS — R7303 Prediabetes: Secondary | ICD-10-CM | POA: Insufficient documentation

## 2023-07-05 DIAGNOSIS — F419 Anxiety disorder, unspecified: Secondary | ICD-10-CM | POA: Insufficient documentation

## 2023-07-05 NOTE — Assessment & Plan Note (Signed)
 Uncontrolled and reports current Psych has advised he needs a new MD, , not suicidal or homicdal, continue meds as before

## 2023-07-05 NOTE — Progress Notes (Signed)
 [name]     MRN: [id number]      DOB: [birth year]  No chief complaint on file.   HPI [name] is here for follow up and re-evaluation of chronic medical conditions, medication management and review of any available recent lab and radiology data.  Preventive health is updated, specifically  Cancer screening and Immunization.   Accompanied by Mother and he has an appointment to enter out pt rehab in [place] so does not need / will not establish with clinic in [place] Has multiple ED visits with positive cocaine screen Needs to establish with new Psych reports has been d/c from clinic in [place] due to distance  ROS Denies recent fever or chills. Denies sinus pressure, nasal congestion, ear pain or sore throat. Denies chest congestion, productive cough or wheezing. Denies chest pains, palpitations and leg swelling Denies abdominal pain, nausea, vomiting,diarrhea or constipation.   Denies dysuria, frequency, hesitancy or incontinence. Denies skin break down or rash.   PE  BP 126/82   Pulse 95   Ht 5\' 5"  (1.651 m)   Wt 275 lb 0.6 oz (124.8 kg)   SpO2 95%   BMI 45.77 kg/m   Patient alert and oriented and in no cardiopulmonary distress.  HEENT: No facial asymmetry, EOMI,     Neck supple .  Chest: Clear to auscultation bilaterally.  CVS: S1, S2 no murmurs, no S3.Regular rate.  ABD: Soft non tender.   Ext: No edema  MS: Adequate ROM spine, shoulders, hips and knees.  Skin: Intact, no ulcerations or rash noted.  Psych: Good eye contact, normal affect. Memory intact not anxious or depressed appearing.  CNS: CN 2-12 intact, power,  normal throughout.no focal deficits noted.   Assessment & Plan  Cocaine abuse (HCC) Reports that he has appt in out pt rehab in am  Depression, major, single episode, moderate (HCC) Uncontrolled and reports current Psych has advised he needs a new MD, , not suicidal or homicdal, continue meds as before  Anxiety Refer  to psych for management  Hyperlipidemia associated with type 2 diabetes mellitus (HCC) Hyperlipidemia:Low fat diet discussed and encouraged.   Lipid Panel  Lab Results  Component Value Date   CHOL 145 03/17/2023   HDL 36 (L) 03/17/2023   LDLCALC 86 03/17/2023   TRIG 130 03/17/2023   CHOLHDL 4.0 03/17/2023     Controlled, no change in medication Updated lab needed at/ before next visit.   Essential hypertension Normotensive off medication with weight loss  Prediabetes Patient educated about the importance of limiting  Carbohydrate intake , the need to commit to daily physical activity for a minimum of 30 minutes , and to commit weight loss. The fact that changes in all these areas will reduce or eliminate all together the development of diabetes is stressed.      Latest Ref Rng & Units 06/30/2023   11:18 PM 04/27/2023    3:01 AM 04/07/2023    2:27 AM 03/29/2023   10:40 PM 03/17/2023    4:14 PM  Diabetic Labs  HbA1c 4.8 - 5.6 %     5.4   Micro/Creat Ratio 0 - 29 mg/g creat     5   Chol 100 - 199 mg/dL     782   HDL >95 mg/dL     36   Calc LDL 0 - 99 mg/dL     86   Triglycerides 0 - 149 mg/dL     621   Creatinine 3.08 -  1.24 mg/dL 1.61  0.96  0.45  4.09  0.92       07/01/2023    3:56 AM 06/30/2023   11:11 PM 06/30/2023   10:46 PM 06/28/2023    3:45 PM 04/28/2023    3:30 PM 04/27/2023   12:25 PM 04/27/2023   12:03 PM  BP/Weight  Systolic BP 138  811 126 123 143 138  Diastolic BP 88  92 82 74 92 68  Wt. (Lbs)  275.13  275.04 268    BMI  45.78 kg/m2  45.77 kg/m2 44.6 kg/m2        03/17/2023    3:20 PM 04/20/2021   10:20 AM  Foot/eye exam completion dates  Foot Form Completion Done Done    Updated lab needed at/ before next visit.   Morbid obesity due to excess calories [facility])  Patient re-educated about  the importance of commitment to a  minimum of 150 minutes of exercise per week as able.  The importance of healthy food choices with portion control discussed, as well as  eating regularly and within a 12 hour window most days. The need to choose "clean , green" food 50 to 75% of the time is discussed, as well as to make water  the primary drink and set a goal of 64 ounces water  daily.       06/30/2023   11:11 PM 06/28/2023    3:45 PM 04/28/2023    3:30 PM  Weight /BMI  Weight 275 lb 2.2 oz 275 lb 0.6 oz 268 lb  Height 5\' 5"  (1.651 m) 5\' 5"  (1.651 m) 5\' 5"  (1.651 m)  BMI 45.78 kg/m2 45.77 kg/m2 44.6 kg/m2    unchanged

## 2023-07-05 NOTE — Assessment & Plan Note (Signed)
 Normotensive off medication with weight loss

## 2023-07-05 NOTE — Telephone Encounter (Signed)
 Spoke to pt mother. States pt does not want to switch to [facility] as far as she is aware. She will speak to pt about it to confirm and will cb if any changes need to be made

## 2023-07-05 NOTE — Assessment & Plan Note (Signed)
 Hyperlipidemia:Low fat diet discussed and encouraged.   Lipid Panel  Lab Results  Component Value Date   CHOL 145 03/17/2023   HDL 36 (L) 03/17/2023   LDLCALC 86 03/17/2023   TRIG 130 03/17/2023   CHOLHDL 4.0 03/17/2023     Controlled, no change in medication Updated lab needed at/ before next visit.

## 2023-07-05 NOTE — Assessment & Plan Note (Signed)
  Patient re-educated about  the importance of commitment to a  minimum of 150 minutes of exercise per week as able.  The importance of healthy food choices with portion control discussed, as well as eating regularly and within a 12 hour window most days. The need to choose "clean , green" food 50 to 75% of the time is discussed, as well as to make water  the primary drink and set a goal of 64 ounces water  daily.       06/30/2023   11:11 PM 06/28/2023    3:45 PM 04/28/2023    3:30 PM  Weight /BMI  Weight 275 lb 2.2 oz 275 lb 0.6 oz 268 lb  Height 5\' 5"  (1.651 m) 5\' 5"  (1.651 m) 5\' 5"  (1.651 m)  BMI 45.78 kg/m2 45.77 kg/m2 44.6 kg/m2    unchanged

## 2023-07-05 NOTE — Assessment & Plan Note (Signed)
 Reports that he has appt in out pt rehab in am

## 2023-07-05 NOTE — Assessment & Plan Note (Signed)
 Refer to psych for management

## 2023-07-05 NOTE — Assessment & Plan Note (Signed)
 Patient educated about the importance of limiting  Carbohydrate intake , the need to commit to daily physical activity for a minimum of 30 minutes , and to commit weight loss. The fact that changes in all these areas will reduce or eliminate all together the development of diabetes is stressed.      Latest Ref Rng & Units 06/30/2023   11:18 PM 04/27/2023    3:01 AM 04/07/2023    2:27 AM 03/29/2023   10:40 PM 03/17/2023    4:14 PM  Diabetic Labs  HbA1c 4.8 - 5.6 %     5.4   Micro/Creat Ratio 0 - 29 mg/g creat     5   Chol 100 - 199 mg/dL     161   HDL >09 mg/dL     36   Calc LDL 0 - 99 mg/dL     86   Triglycerides 0 - 149 mg/dL     604   Creatinine 5.40 - 1.24 mg/dL 9.81  1.91  4.78  2.95  0.92       07/01/2023    3:56 AM 06/30/2023   11:11 PM 06/30/2023   10:46 PM 06/28/2023    3:45 PM 04/28/2023    3:30 PM 04/27/2023   12:25 PM 04/27/2023   12:03 PM  BP/Weight  Systolic BP 138  621 126 123 143 138  Diastolic BP 88  92 82 74 92 68  Wt. (Lbs)  275.13  275.04 268    BMI  45.78 kg/m2  45.77 kg/m2 44.6 kg/m2        03/17/2023    3:20 PM 04/20/2021   10:20 AM  Foot/eye exam completion dates  Foot Form Completion Done Done    Updated lab needed at/ before next visit.

## 2023-07-18 ENCOUNTER — Ambulatory Visit (HOSPITAL_BASED_OUTPATIENT_CLINIC_OR_DEPARTMENT_OTHER): Attending: Pulmonary Disease

## 2023-07-18 DIAGNOSIS — G4733 Obstructive sleep apnea (adult) (pediatric): Secondary | ICD-10-CM

## 2023-07-25 ENCOUNTER — Other Ambulatory Visit: Payer: Self-pay | Admitting: Family Medicine

## 2023-07-31 ENCOUNTER — Emergency Department (HOSPITAL_COMMUNITY)

## 2023-07-31 ENCOUNTER — Other Ambulatory Visit: Payer: Self-pay

## 2023-07-31 ENCOUNTER — Emergency Department (HOSPITAL_COMMUNITY)
Admission: EM | Admit: 2023-07-31 | Discharge: 2023-07-31 | Disposition: A | Discharge status: Home / Self Care | Attending: Emergency Medicine | Admitting: Emergency Medicine

## 2023-07-31 ENCOUNTER — Encounter (HOSPITAL_COMMUNITY): Payer: Self-pay | Admitting: Emergency Medicine

## 2023-07-31 DIAGNOSIS — J020 Streptococcal pharyngitis: Secondary | ICD-10-CM | POA: Insufficient documentation

## 2023-07-31 DIAGNOSIS — R600 Localized edema: Secondary | ICD-10-CM | POA: Diagnosis not present

## 2023-07-31 LAB — BASIC METABOLIC PANEL WITH GFR
Anion gap: 8 (ref 5–15)
BUN: 16 mg/dL (ref 6–20)
CO2: 23 mmol/L (ref 22–32)
Calcium: 8.7 mg/dL — ABNORMAL LOW (ref 8.9–10.3)
Chloride: 111 mmol/L (ref 98–111)
Creatinine, Ser: 0.76 mg/dL (ref 0.61–1.24)
GFR, Estimated: >60 mL/min (ref >60)
Glucose, Bld: 85 mg/dL (ref 70–99)
Potassium: 3.9 mmol/L (ref 3.5–5.1)
Sodium: 142 mmol/L (ref 135–145)

## 2023-07-31 LAB — CBC
HCT: 38.5 % — ABNORMAL LOW (ref 39.0–52.0)
Hemoglobin: 12.9 g/dL — ABNORMAL LOW (ref 13.0–17.0)
MCH: 30 pg (ref 26.0–34.0)
MCHC: 33.5 g/dL (ref 30.0–36.0)
MCV: 89.5 fL (ref 80.0–100.0)
Platelets: 262 10*3/uL (ref 150–400)
RBC: 4.3 MIL/uL (ref 4.22–5.81)
RDW: 14.2 % (ref 11.5–15.5)
WBC: 10.4 10*3/uL (ref 4.0–10.5)
nRBC: 0 % (ref 0.0–0.2)

## 2023-07-31 LAB — GROUP A STREP BY PCR: Group A Strep by PCR: DETECTED — AB

## 2023-07-31 MED ORDER — PENICILLIN V POTASSIUM 500 MG PO TABS: 500.0000 mg | ORAL_TABLET | Freq: Four times a day (QID) | ORAL | 0 refills | Status: DC

## 2023-07-31 MED ORDER — FUROSEMIDE 20 MG PO TABS: 20.0000 mg | ORAL_TABLET | Freq: Every day | ORAL | 0 refills | Status: DC

## 2023-07-31 MED ORDER — FUROSEMIDE 40 MG PO TABS
40.0000 mg | ORAL_TABLET | Freq: Once | ORAL | Status: AC
  Administered 2023-07-31: 40 mg via ORAL
  Filled 2023-07-31: qty 1

## 2023-07-31 MED ORDER — DOXYCYCLINE HYCLATE 100 MG PO TABS
100.0000 mg | ORAL_TABLET | Freq: Once | ORAL | Status: AC
  Administered 2023-07-31: 100 mg via ORAL
  Filled 2023-07-31: qty 1

## 2023-07-31 NOTE — Discharge Instructions (Signed)
 Your testing today shows that you have strep throat, there is no pneumonia, take penicillin  4 times daily, complete all the medication, consider self contagious to others  I have refilled your Lasix , take this once a day for the next 2 weeks but you will need to have your family doctor fill this for in an ongoing fashion

## 2023-07-31 NOTE — ED Provider Notes (Signed)
 [place] [facility] DEPARTMENT AT [facility] Provider Note   CSN: [id number] Arrival date & time: 07/31/23  2028     History  Chief Complaint  Patient presents with   Shortness of Breath    Shawn Ramsey is a 43 y.o. male.   Shortness of Breath  This patient is a 43 year old male, he unfortunately was morbidly obese and went into respiratory distress and failure requiring a prolonged ventilatory stay in the hospital in the ICU and subsequently required a tracheostomy.  He does follow with his tracheostomy team and is hopeful that he can be weaned off of the trach but at this time still has it.  He states that over the course of the last day or 2 he has developed a bit of a sore throat, increasing coughing and some phlegm also noticed that he has had some increasing swelling of his legs.  The patient is lost over 100 pounds and is attempt to get off of some of his medications and was told by his doctor that he longer has the need for treatment for diabetes.  Unfortunately he has started to have some self-destructive behavior and is drinking heavily with alcohol smoking heavily with cigarettes and starting to use drugs all of which she does have a history of.    Home Medications Prior to Admission medications   Medication Sig Start Date End Date Taking? Authorizing Provider  furosemide  (LASIX ) 20 MG tablet Take 1 tablet (20 mg total) by mouth daily for 14 days. 07/31/23 08/14/23 Yes Early Glisson, MD  penicillin  v potassium (VEETID) 500 MG tablet Take 1 tablet (500 mg total) by mouth 4 (four) times daily. 07/31/23  Yes Early Glisson, MD  albuterol  (PROVENTIL ) (2.5 MG/3ML) 0.083% nebulizer solution Take 3 mLs (2.5 mg total) by nebulization every 6 (six) hours as needed for wheezing or shortness of breath. 04/22/21   Sood, Vineet, MD  albuterol  (VENTOLIN  HFA) 108 (90 Base) MCG/ACT inhaler Inhale 1-2 puffs into the lungs every 6 (six) hours as needed for wheezing or shortness of  breath. 08/26/21   Towanda Fret, MD  atorvastatin  (LIPITOR) 40 MG tablet Take 1 tablet by mouth once daily 05/15/23   Simpson, Margaret E, MD  Diclofenac  Sodium 3 % GEL Apply to left knee three times daily, as needed, for pain 05/04/22   Towanda Fret, MD  hydrocortisone  cream 1 % Apply to affected area 2 times daily Patient not taking: Reported on 04/27/2023 03/30/23   Edson Graces, MD  melatonin 5 MG TABS Take 1 tablet (5 mg total) by mouth at bedtime. Patient not taking: Reported on 04/28/2023 07/15/20   Towanda Fret, MD  ondansetron  (ZOFRAN -ODT) 4 MG disintegrating tablet Take 1 tablet (4 mg total) by mouth every 8 (eight) hours as needed for nausea or vomiting. 06/28/23   Towanda Fret, MD  pantoprazole  (PROTONIX ) 40 MG tablet Take 1 tablet (40 mg total) by mouth daily. 12/28/22   Ballard Bongo, MD  potassium chloride  SA (KLOR-CON  M) 20 MEQ tablet Take 1 tablet (20 mEq total) by mouth 2 (two) times daily. 06/28/23   Towanda Fret, MD  PULMICORT  0.25 MG/2ML nebulizer solution Take 2 mLs (0.25 mg total) by nebulization in the morning and at bedtime. Patient not taking: Reported on 04/28/2023 04/22/21   Sood, Vineet, MD  sertraline  (ZOLOFT ) 100 MG tablet Take 2 tablets (200 mg total) by mouth daily. 06/28/23   Towanda Fret, MD  sucralfate  (CARAFATE ) 1  GM/10ML suspension Take 10 mLs (1 g total) by mouth 4 (four) times daily -  with meals and at bedtime. 12/28/22   Ballard Bongo, MD  tadalafil  (CIALIS ) 20 MG tablet TAKE 1/2 TO 1 (ONE-HALF TO ONE) TABLET BY MOUTH EVERY OTHER DAY AS NEEDED FOR ERECTILE DYSFUNCTION 06/26/23   Towanda Fret, MD  torsemide  (DEMADEX ) 20 MG tablet Take 1 tablet (20 mg total) by mouth 2 (two) times daily. 06/28/23   Towanda Fret, MD  WEGOVY  0.25 MG/0.5ML SOAJ Inject 0.25 mg into the skin once a week. 05/11/23   Towanda Fret, MD  WEGOVY  0.5 MG/0.5ML SOAJ Inject 0.5 mg into the skin once a week. 06/08/23   Towanda Fret, MD      Allergies    Patient has no known allergies.    Review of Systems   Review of Systems  Respiratory:  Positive for shortness of breath.   All other systems reviewed and are negative.   Physical Exam Updated Vital Signs BP 122/84   Pulse 87   Temp 98.9 F (37.2 C)   Resp 19   Ht 1.651 m (5\' 5" )   Wt 125 kg   SpO2 96%   BMI 45.86 kg/m  Physical Exam Vitals and nursing note reviewed.  Constitutional:      General: He is not in acute distress.    Appearance: He is well-developed.  HENT:     Head: Normocephalic and atraumatic.     Mouth/Throat:     Pharynx: Posterior oropharyngeal erythema present. No oropharyngeal exudate.  Eyes:     General: No scleral icterus.       Right eye: No discharge.        Left eye: No discharge.     Conjunctiva/sclera: Conjunctivae normal.     Pupils: Pupils are equal, round, and reactive to light.  Neck:     Thyroid : No thyromegaly.     Vascular: No JVD.  Cardiovascular:     Rate and Rhythm: Normal rate and regular rhythm.     Heart sounds: Normal heart sounds. No murmur heard.    No friction rub. No gallop.  Pulmonary:     Effort: Pulmonary effort is normal. No respiratory distress.     Breath sounds: Normal breath sounds. No wheezing or rales.  Abdominal:     General: Bowel sounds are normal. There is no distension.     Palpations: Abdomen is soft. There is no mass.     Tenderness: There is no abdominal tenderness.  Musculoskeletal:        General: No tenderness. Normal range of motion.     Cervical back: Normal range of motion and neck supple.     Right lower leg: Edema present.     Left lower leg: Edema present.  Lymphadenopathy:     Cervical: No cervical adenopathy.  Skin:    General: Skin is warm and dry.     Findings: No erythema or rash.  Neurological:     Mental Status: He is alert.     Coordination: Coordination normal.  Psychiatric:        Behavior: Behavior normal.     ED Results / Procedures /  Treatments   Labs (all labs ordered are listed, but only abnormal results are displayed) Labs Reviewed  GROUP A STREP BY PCR - Abnormal; Notable for the following components:      Result Value   Group A Strep by PCR DETECTED (*)  All other components within normal limits  BASIC METABOLIC PANEL WITH GFR - Abnormal; Notable for the following components:   Calcium  8.7 (*)    All other components within normal limits  CBC - Abnormal; Notable for the following components:   Hemoglobin 12.9 (*)    HCT 38.5 (*)    All other components within normal limits    EKG None  Radiology DG Chest Portable 1 View Result Date: 07/31/2023 CLINICAL DATA:  Shortness of breath EXAM: PORTABLE CHEST 1 VIEW COMPARISON:  12/27/2022 FINDINGS: Tracheostomy tube tip about 5.3 cm superior to carina. No acute airspace disease, pleural effusion or pneumothorax. Stable cardiomediastinal silhouette IMPRESSION: No active disease. Electronically Signed   By: Esmeralda Hedge M.D.   On: 07/31/2023 20:55    Procedures Procedures    Medications Ordered in ED Medications  furosemide  (LASIX ) tablet 40 mg (40 mg Oral Given 07/31/23 2103)  doxycycline  (VIBRA -TABS) tablet 100 mg (100 mg Oral Given 07/31/23 2103)    ED Course/ Medical Decision Making/ A&P                                 Medical Decision Making Amount and/or Complexity of Data Reviewed Labs: ordered. Radiology: ordered.  Risk Prescription drug management.    This patient presents to the ED for concern of increasing cough, tracheostomy soreness, sore throat differential diagnosis includes potential infection, strep throat, there is some erythema of the posterior pharynx, x-ray to rule out pneumonia, will give him some Lasix     Additional history obtained   Additional history obtained from Electronic Medical Record External records from outside source obtained and reviewed including medical record, this includes his history of substance abuse for  which she was seen about a month ago, it is noted that he has been noncompliant with his medical regimen based on prior ED visits including about 1 to 2 months ago, he does follow with a tracheostomy clinic, no recent admissions to the hospital   Lab Tests:  I Ordered, and personally interpreted labs.  The pertinent results include: Positive for strep, metabolic panel is reassuring, CBC without leukocytosis, chest x-ray without any acute disease   Imaging Studies ordered:  I ordered imaging studies including chest x-ray I independently visualized and interpreted imaging which showed no infiltrates or edema I agree with the radiologist interpretation   Medicines ordered and prescription drug management:  I ordered medication including doxycycline  initially but changed to penicillin     I have reviewed the patients home medicines and have made adjustments as needed   Problem List / ED Course:  Home with strep throat coverage with penicillin    Social Determinants of Health:  Substance abuse, counseling given           Final Clinical Impression(s) / ED Diagnoses Final diagnoses:  Strep throat    Rx / DC Orders ED Discharge Orders          Ordered    penicillin  v potassium (VEETID) 500 MG tablet  4 times daily        07/31/23 2152    furosemide  (LASIX ) 20 MG tablet  Daily        07/31/23 2152              Early Glisson, MD 07/31/23 2153

## 2023-07-31 NOTE — ED Triage Notes (Signed)
 Pt here with initial complaints of pain at trach site. Denies noticing any increase in secretions. Pt also c/o SOB and states he hasn't taken his "fluid pills" today.

## 2023-08-23 ENCOUNTER — Other Ambulatory Visit: Payer: Self-pay | Admitting: Family Medicine

## 2023-08-23 ENCOUNTER — Ambulatory Visit: Admitting: Family Medicine

## 2023-08-24 ENCOUNTER — Telehealth: Payer: Self-pay | Admitting: Family Medicine

## 2023-08-24 ENCOUNTER — Encounter (HOSPITAL_BASED_OUTPATIENT_CLINIC_OR_DEPARTMENT_OTHER): Payer: Self-pay | Admitting: Pulmonary Disease

## 2023-08-24 ENCOUNTER — Ambulatory Visit (HOSPITAL_BASED_OUTPATIENT_CLINIC_OR_DEPARTMENT_OTHER): Admitting: Pulmonary Disease

## 2023-08-24 NOTE — Telephone Encounter (Signed)
 Copied from CRM [id number]. Topic: Referral - Request for Referral >> Aug 24, 2023  1:12 PM Powell HERO wrote: Did the patient discuss referral with their provider in the last year? Yes  Appointment offered? No  Type of order/referral and detailed reason for visit: Patient is wanting a referral to Alcohol Treatment/ Mental Health   Preference of office, provider, location: [place]  If referral order, have you been seen by this specialty before? No  Can we respond through MyChart? No

## 2023-08-29 ENCOUNTER — Encounter: Payer: Self-pay | Admitting: Family Medicine

## 2023-08-31 ENCOUNTER — Telehealth: Payer: Self-pay

## 2023-08-31 NOTE — Telephone Encounter (Signed)
 Copied from CRM [id number]. Topic: Referral - Request for Referral >> Aug 31, 2023  3:05 PM Silvana PARAS wrote: Pt calling to f/u on rehab referral. Callback number is [phone].

## 2023-09-05 ENCOUNTER — Other Ambulatory Visit: Payer: Self-pay | Admitting: Family Medicine

## 2023-09-07 ENCOUNTER — Other Ambulatory Visit: Payer: Self-pay

## 2023-09-07 DIAGNOSIS — F1410 Cocaine abuse, uncomplicated: Secondary | ICD-10-CM

## 2023-09-07 DIAGNOSIS — F1910 Other psychoactive substance abuse, uncomplicated: Secondary | ICD-10-CM

## 2023-09-07 NOTE — Telephone Encounter (Signed)
 N/A, vm not set up. Sent referral for nurse case management

## 2023-09-08 ENCOUNTER — Telehealth: Payer: Self-pay

## 2023-09-08 NOTE — Progress Notes (Signed)
 Complex Care Management Note Care Guide Note  09/08/2023 Name: Shawn Ramsey MRN: [id number] DOB: [birth year]   Complex Care Management Outreach Attempts: An unsuccessful telephone outreach was attempted today to offer the patient information about available complex care management services.  Follow Up Plan:  Additional outreach attempts will be made to offer the patient complex care management information and services.   Encounter Outcome:  No Answer  Jeoffrey Buffalo , RMA     [place]  [facility], [facility] Guide  Direct Dial: [phone]  Website: [email or web address]

## 2023-09-11 NOTE — Telephone Encounter (Signed)
 Sent letter

## 2023-09-13 ENCOUNTER — Other Ambulatory Visit: Payer: Self-pay

## 2023-09-13 ENCOUNTER — Ambulatory Visit (HOSPITAL_COMMUNITY)
Admission: EM | Admit: 2023-09-13 | Discharge: 2023-09-14 | Disposition: A | Discharge status: Home / Self Care | Source: Intra-hospital

## 2023-09-13 ENCOUNTER — Ambulatory Visit (HOSPITAL_COMMUNITY)
Admission: EM | Admit: 2023-09-13 | Discharge: 2023-09-13 | Disposition: A | Discharge status: Other Acute Inpatient Hospital | Attending: Psychiatry | Admitting: Psychiatry

## 2023-09-13 ENCOUNTER — Emergency Department (HOSPITAL_COMMUNITY)

## 2023-09-13 ENCOUNTER — Emergency Department (HOSPITAL_COMMUNITY)
Admission: EM | Admit: 2023-09-13 | Discharge: 2023-09-13 | Disposition: A | Discharge status: Other Acute Inpatient Hospital | Source: Ambulatory Visit | Attending: Emergency Medicine | Admitting: Emergency Medicine

## 2023-09-13 ENCOUNTER — Encounter (HOSPITAL_COMMUNITY): Payer: Self-pay

## 2023-09-13 DIAGNOSIS — R440 Auditory hallucinations: Secondary | ICD-10-CM | POA: Insufficient documentation

## 2023-09-13 DIAGNOSIS — I509 Heart failure, unspecified: Secondary | ICD-10-CM | POA: Insufficient documentation

## 2023-09-13 DIAGNOSIS — Z930 Tracheostomy status: Secondary | ICD-10-CM | POA: Insufficient documentation

## 2023-09-13 DIAGNOSIS — Z7985 Long-term (current) use of injectable non-insulin antidiabetic drugs: Secondary | ICD-10-CM | POA: Insufficient documentation

## 2023-09-13 DIAGNOSIS — G4730 Sleep apnea, unspecified: Secondary | ICD-10-CM | POA: Insufficient documentation

## 2023-09-13 DIAGNOSIS — R600 Localized edema: Secondary | ICD-10-CM | POA: Diagnosis not present

## 2023-09-13 DIAGNOSIS — F1910 Other psychoactive substance abuse, uncomplicated: Secondary | ICD-10-CM | POA: Insufficient documentation

## 2023-09-13 DIAGNOSIS — Z7951 Long term (current) use of inhaled steroids: Secondary | ICD-10-CM | POA: Insufficient documentation

## 2023-09-13 DIAGNOSIS — F17290 Nicotine dependence, other tobacco product, uncomplicated: Secondary | ICD-10-CM | POA: Diagnosis not present

## 2023-09-13 DIAGNOSIS — F339 Major depressive disorder, recurrent, unspecified: Secondary | ICD-10-CM | POA: Insufficient documentation

## 2023-09-13 DIAGNOSIS — F88 Other disorders of psychological development: Secondary | ICD-10-CM | POA: Insufficient documentation

## 2023-09-13 DIAGNOSIS — E119 Type 2 diabetes mellitus without complications: Secondary | ICD-10-CM | POA: Insufficient documentation

## 2023-09-13 DIAGNOSIS — Z79899 Other long term (current) drug therapy: Secondary | ICD-10-CM | POA: Insufficient documentation

## 2023-09-13 DIAGNOSIS — F1410 Cocaine abuse, uncomplicated: Secondary | ICD-10-CM | POA: Insufficient documentation

## 2023-09-13 DIAGNOSIS — Y900 Blood alcohol level of less than 20 mg/100 ml: Secondary | ICD-10-CM | POA: Diagnosis not present

## 2023-09-13 DIAGNOSIS — Z87891 Personal history of nicotine dependence: Secondary | ICD-10-CM | POA: Insufficient documentation

## 2023-09-13 DIAGNOSIS — I110 Hypertensive heart disease with heart failure: Secondary | ICD-10-CM | POA: Insufficient documentation

## 2023-09-13 DIAGNOSIS — F411 Generalized anxiety disorder: Secondary | ICD-10-CM | POA: Insufficient documentation

## 2023-09-13 DIAGNOSIS — J449 Chronic obstructive pulmonary disease, unspecified: Secondary | ICD-10-CM | POA: Insufficient documentation

## 2023-09-13 LAB — CBC WITH DIFFERENTIAL/PLATELET
Abs Immature Granulocytes: 0.03 K/uL (ref 0.00–0.07)
Abs Immature Granulocytes: 0.02 K/uL (ref 0.00–0.07)
Basophils Absolute: 0.1 K/uL (ref 0.0–0.1)
Basophils Absolute: 0 K/uL (ref 0.0–0.1)
Basophils Relative: 1 %
Basophils Relative: 0 %
Eosinophils Absolute: 0.1 K/uL (ref 0.0–0.5)
Eosinophils Absolute: 0.1 K/uL (ref 0.0–0.5)
Eosinophils Relative: 1 %
Eosinophils Relative: 1 %
HCT: 41.9 % (ref 39.0–52.0)
HCT: 39.8 % (ref 39.0–52.0)
Hemoglobin: 14.6 g/dL (ref 13.0–17.0)
Hemoglobin: 13.5 g/dL (ref 13.0–17.0)
Immature Granulocytes: 0 %
Immature Granulocytes: 0 %
Lymphocytes Relative: 37 %
Lymphocytes Relative: 34 %
Lymphs Abs: 2.8 K/uL (ref 0.7–4.0)
Lymphs Abs: 1.7 K/uL (ref 0.7–4.0)
MCH: 31.1 pg (ref 26.0–34.0)
MCH: 30.5 pg (ref 26.0–34.0)
MCHC: 34.8 g/dL (ref 30.0–36.0)
MCHC: 33.9 g/dL (ref 30.0–36.0)
MCV: 89.1 fL (ref 80.0–100.0)
MCV: 90 fL (ref 80.0–100.0)
Monocytes Absolute: 0.5 K/uL (ref 0.1–1.0)
Monocytes Absolute: 0.5 K/uL (ref 0.1–1.0)
Monocytes Relative: 7 %
Monocytes Relative: 10 %
Neutro Abs: 4.1 K/uL (ref 1.7–7.7)
Neutro Abs: 2.7 K/uL (ref 1.7–7.7)
Neutrophils Relative %: 54 %
Neutrophils Relative %: 55 %
Platelets: 301 K/uL (ref 150–400)
Platelets: 290 K/uL (ref 150–400)
RBC: 4.7 MIL/uL (ref 4.22–5.81)
RBC: 4.42 MIL/uL (ref 4.22–5.81)
RDW: 15.5 % (ref 11.5–15.5)
RDW: 15.8 % — ABNORMAL HIGH (ref 11.5–15.5)
WBC: 7.6 K/uL (ref 4.0–10.5)
WBC: 4.9 K/uL (ref 4.0–10.5)
nRBC: 0 % (ref 0.0–0.2)
nRBC: 0 % (ref 0.0–0.2)

## 2023-09-13 LAB — COMPREHENSIVE METABOLIC PANEL WITH GFR
ALT: 17 U/L (ref 0–44)
ALT: 16 U/L (ref 0–44)
AST: 15 U/L (ref 15–41)
AST: 14 U/L — ABNORMAL LOW (ref 15–41)
Albumin: 3.9 g/dL (ref 3.5–5.0)
Albumin: 3.7 g/dL (ref 3.5–5.0)
Alkaline Phosphatase: 69 U/L (ref 38–126)
Alkaline Phosphatase: 64 U/L (ref 38–126)
Anion gap: 13 (ref 5–15)
Anion gap: 11 (ref 5–15)
BUN: 15 mg/dL (ref 6–20)
BUN: 13 mg/dL (ref 6–20)
CO2: 20 mmol/L — ABNORMAL LOW (ref 22–32)
CO2: 21 mmol/L — ABNORMAL LOW (ref 22–32)
Calcium: 9.1 mg/dL (ref 8.9–10.3)
Calcium: 8.7 mg/dL — ABNORMAL LOW (ref 8.9–10.3)
Chloride: 107 mmol/L (ref 98–111)
Chloride: 109 mmol/L (ref 98–111)
Creatinine, Ser: 0.78 mg/dL (ref 0.61–1.24)
Creatinine, Ser: 0.58 mg/dL — ABNORMAL LOW (ref 0.61–1.24)
GFR, Estimated: >60 mL/min (ref >60)
GFR, Estimated: >60 mL/min (ref >60)
Glucose, Bld: 95 mg/dL (ref 70–99)
Glucose, Bld: 91 mg/dL (ref 70–99)
Potassium: 3.9 mmol/L (ref 3.5–5.1)
Potassium: 3.7 mmol/L (ref 3.5–5.1)
Sodium: 140 mmol/L (ref 135–145)
Sodium: 141 mmol/L (ref 135–145)
Total Bilirubin: 0.5 mg/dL (ref 0.0–1.2)
Total Bilirubin: 0.8 mg/dL (ref 0.0–1.2)
Total Protein: 6.8 g/dL (ref 6.5–8.1)
Total Protein: 7.2 g/dL (ref 6.5–8.1)

## 2023-09-13 LAB — BRAIN NATRIURETIC PEPTIDE: B Natriuretic Peptide: 33.1 pg/mL (ref 0.0–100.0)

## 2023-09-13 LAB — POCT URINE DRUG SCREEN - MANUAL ENTRY (I-SCREEN)
POC Amphetamine UR: NOT DETECTED
POC Buprenorphine (BUP): NOT DETECTED
POC Cocaine UR: POSITIVE — AB
POC Marijuana UR: NOT DETECTED
POC Methadone UR: NOT DETECTED
POC Methamphetamine UR: NOT DETECTED
POC Morphine: NOT DETECTED
POC Oxazepam (BZO): NOT DETECTED
POC Oxycodone UR: NOT DETECTED
POC Secobarbital (BAR): NOT DETECTED

## 2023-09-13 LAB — TSH: TSH: 3.67 u[IU]/mL (ref 0.350–4.500)

## 2023-09-13 LAB — ETHANOL
Alcohol, Ethyl (B): <15 mg/dL (ref <15)
Alcohol, Ethyl (B): <15 mg/dL (ref <15)

## 2023-09-13 MED ORDER — ALUM & MAG HYDROXIDE-SIMETH 200-200-20 MG/5ML PO SUSP: 30.0000 mL | ORAL | Status: DC | PRN

## 2023-09-13 MED ORDER — MAGNESIUM HYDROXIDE 400 MG/5ML PO SUSP: 30.0000 mL | Freq: Every day | ORAL | Status: DC | PRN

## 2023-09-13 MED ORDER — ACETAMINOPHEN 325 MG PO TABS: 650.0000 mg | ORAL_TABLET | Freq: Four times a day (QID) | ORAL | Status: DC | PRN

## 2023-09-13 MED ORDER — OLANZAPINE 10 MG IM SOLR: 10.0000 mg | Freq: Three times a day (TID) | INTRAMUSCULAR | Status: DC | PRN

## 2023-09-13 MED ORDER — OLANZAPINE 10 MG IM SOLR: 5.0000 mg | Freq: Three times a day (TID) | INTRAMUSCULAR | Status: DC | PRN

## 2023-09-13 MED ORDER — OLANZAPINE 5 MG PO TBDP: 5.0000 mg | ORAL_TABLET | Freq: Three times a day (TID) | ORAL | Status: DC | PRN

## 2023-09-13 NOTE — BH Assessment (Signed)
 Comprehensive Clinical Assessment (CCA) Note  09/13/2023 Shawn Ramsey [id number]  Disposition: Gaither Pouch, NP, recommends observation unit for safety and stabilization with psych reassessment in the AM.   The patient demonstrates the following risk factors for suicide: Chronic risk factors for suicide include: psychiatric disorder of MDD and substance use disorder. Acute risk factors for suicide include: family or marital conflict and social withdrawal/isolation. Protective factors for this patient include: positive therapeutic relationship, responsibility to others (children, family), coping skills, and hope for the future. Considering these factors, the overall suicide risk at this point appears to be moderate. Patient is not appropriate for outpatient follow up.  Shawn Ramsey is a a 43 year old male presenting as a voluntary walk-in to [facility] requesting detox. Patient history of cocaine abuse, alcohol abuse and MDD. Patient denied SI, HI, psychosis and drug usage.   Patient reports alcohol and cocaine usage. Patient last used cocaine prior to arrival. Patient last used alcohol 2 days ago. Patient reported worsening depressive symptoms. Patient reports poor sleep and normal appetite.   Patient does not have a Veterinary surgeon.  Patient reports receiving medication management for anxiety.  Patient reports feeling the best medication is working. Patient denies prior psychiatric hospitalizations, suicide attempts and self-harm behaviors.  Patient lives with mother.  Patient reports having 5 children with good relationships with them.  Patient has been on disability for mental health reasons, timeframe unknown.  Patient denies access to gun.  Patient was calm and cooperative during assessment.  Patient seeking detox treatment.  Chief Complaint:  Chief Complaint  Patient presents with   Addiction Problem   Visit Diagnosis:  Major Depressive Disorder Alcohol Abuse    CCA Screening, Triage  and Referral (STR)  Patient Reported Information How did you hear about us ? Self  What Is the Reason for Your Visit/Call Today? Shawn Ramsey arrived to the [facility] seeking help finding a long term facility for detox. He states that he does cocaine, and relapsed about 2-3 months ago due to his depression. He is diagnosed with depression and he currently takes zoloft . He states that he is primarily looking to detox.  How Long Has This Been Causing You Problems? 1-6 months  What Do You Feel Would Help You the Most Today? Medication(s); Social Support; Stress Management   Have You Recently Had Any Thoughts About Hurting Yourself? No  Are You Planning to Commit Suicide/Harm Yourself At This time? No   Flowsheet Row ED from 09/13/2023 in [facility] ED from 07/31/2023 in [facility] Emergency Department at [facility] ED from 06/30/2023 in [facility] Emergency Department at [facility]  C-SSRS RISK CATEGORY No Risk No Risk No Risk    Have you Recently Had Thoughts About Hurting Someone Shawn Ramsey? No  Are You Planning to Harm Someone at This Time? No  Explanation: n/a   Have You Used Any Alcohol or Drugs in the Past 24 Hours? Yes  How Long Ago Did You Use Drugs or Alcohol? yesterday morning  What Did You Use and How Much? Cocaine over a gram   Do You Currently Have a Therapist/Psychiatrist? Yes  Name of Therapist/Psychiatrist: Name of Therapist/Psychiatrist: anxiety medications, provider unable to recall name   Have You Been Recently Discharged From Any Office Practice or Programs? No  Explanation of Discharge From Practice/Program: n/a    CCA Screening Triage Referral Assessment Type of Contact: Face-to-Face  Telemedicine Service Delivery:  n/a Is this Initial or Reassessment?  N/a Date  Telepsych consult ordered in [facility]:   N/a Time Telepsych consult ordered in [facility]:   N/a Location of Assessment: [facility] Assessment Services  Provider  Location: [facility] Assessment Services   Collateral Involvement: none   Does Patient Have a Automotive engineer Guardian? No  Legal Guardian Contact Information: n/a  Copy of Legal Guardianship Form: -- (n/a)  Legal Guardian Notified of Arrival: -- (n/a)  Legal Guardian Notified of Pending Discharge: -- (n/a)  If Minor and Not Living with Parent(s), Who has Custody? n/a  Is CPS involved or ever been involved? Never  Is APS involved or ever been involved? Never   Patient Determined To Be At Risk for Harm To Self or Others Based on Review of Patient Reported Information or Presenting Complaint? No  Method: No Plan  Availability of Means: No access or NA  Intent: Vague intent or NA  Notification Required: No need or identified person  Additional Information for Danger to Others Potential: -- (n/a)  Additional Comments for Danger to Others Potential: n/a  Are There Guns or Other Weapons in Your Home? No  Types of Guns/Weapons: n/a  Are These Weapons Safely Secured?                            -- (n/a)  Who Could Verify You Are Able To Have These Secured: n/a  Do You Have any Outstanding Charges, Pending Court Dates, Parole/Probation? none reported  Contacted To Inform of Risk of Harm To Self or Others: Other: Comment    Does Patient Present under Involuntary Commitment? No    [place] of Residence: [place]   Patient Currently Receiving the Following Services: Medication Management   Determination of Need: Urgent (48 hours)   Options For Referral: [facility]; Other: Comment; Therapeutic Triage Services; Facility-Based Crisis; Medication Management; Outpatient Therapy     CCA Biopsychosocial Patient Reported Schizophrenia/Schizoaffective Diagnosis in Past: No   Strengths: self-awareness   Mental Health Symptoms Depression:  Hopelessness; Sleep (too much or little); Difficulty Concentrating; Fatigue   Duration of Depressive symptoms: Duration  of Depressive Symptoms: Greater than two weeks   Mania:  None   Anxiety:   Worrying; Tension; Sleep   Psychosis:  None   Duration of Psychotic symptoms:    Trauma:  None   Obsessions:  None   Compulsions:  None   Inattention:  None   Hyperactivity/Impulsivity:  None   Oppositional/Defiant Behaviors:  None   Emotional Irregularity:  None   Other Mood/Personality Symptoms:  n/a    Mental Status Exam Appearance and self-care  Stature:  Average   Weight:  Average weight   Clothing:  Age-appropriate   Grooming:  Normal   Cosmetic use:  None   Posture/gait:  Normal   Motor activity:  Not Remarkable   Sensorium  Attention:  Normal   Concentration:  Normal   Orientation:  X5   Recall/memory:  Normal   Affect and Mood  Affect:  Appropriate   Mood:  Depressed   Relating  Eye contact:  Normal   Facial expression:  Sad   Attitude toward examiner:  Cooperative   Thought and Language  Speech flow: Normal   Thought content:  Appropriate to Mood and Circumstances   Preoccupation:  None   Hallucinations:  None   Organization:  Coherent   [employer] of Knowledge:  Average   Intelligence:  Average   Abstraction:  Normal  Judgement:  Normal   Reality Testing:  Adequate   Insight:  Fair   Decision Making:  Normal   Social Functioning  Social Maturity:  Isolates   Social Judgement:  Heedless   Stress  Stressors:  Relationship   Coping Ability:  Human resources officer Deficits:  Chief Operating Officer; Communication   Supports:  Family     Religion: Religion/Spirituality Are You A Religious Person?: Yes How Might This Affect Treatment?: none  Leisure/Recreation: Leisure / Recreation Do You Have Hobbies?: Yes Leisure and Hobbies: watching tv  Exercise/Diet: Exercise/Diet Do You Exercise?: No Have You Gained or Lost A Significant Amount of Weight in the Past Six Months?: No Do You Follow a Special Diet?:  No Do You Have Any Trouble Sleeping?: Yes Explanation of Sleeping Difficulties: 3 hours   CCA Employment/Education Employment/Work Situation: Employment / Work Situation Employment Situation: On disability Why is Patient on Disability: mental health How Long has Patient Been on Disability: unable to recall Patient's Job has Been Impacted by Current Illness: No Has Patient ever Been in the [employer]?: No  Education: Education Is Patient Currently Attending School?: No Last Grade Completed: 11 Did You Attend College?: No Did You Have An Individualized Education Program (IIEP): No Did You Have Any Difficulty At School?: No Patient's Education Has Been Impacted by Current Illness: No   CCA Family/Childhood History Family and Relationship History: Family history Marital status: Single Does patient have children?: Yes How many children?: 5 How is patient's relationship with their children?: good  Childhood History:  Childhood History By whom was/is the patient raised?: Both parents Did patient suffer any verbal/emotional/physical/sexual abuse as a child?: No Did patient suffer from severe childhood neglect?: No Has patient ever been sexually abused/assaulted/raped as an adolescent or adult?: No Was the patient ever a victim of a crime or a disaster?: No Witnessed domestic violence?: No Has patient been affected by domestic violence as an adult?: No       CCA Substance Use Alcohol/Drug Use: Alcohol / Drug Use Pain Medications: see MAR Prescriptions: see MAR Over the Counter: see MAR History of alcohol / drug use?: Yes Longest period of sobriety (when/how long): n/a Negative Consequences of Use: Personal relationships, Work / Programmer, multimedia Withdrawal Symptoms: None                         ASAM's:  Six Dimensions of Multidimensional Assessment  Dimension 1:  Acute Intoxication and/or Withdrawal Potential:   Dimension 1:  Description of individual's past and  current experiences of substance use and withdrawal: Current usage  Dimension 2:  Biomedical Conditions and Complications:   Dimension 2:  Description of patient's biomedical conditions and  complications: Medical history.  Dimension 3:  Emotional, Behavioral, or Cognitive Conditions and Complications:  Dimension 3:  Description of emotional, behavioral, or cognitive conditions and complications: Anxiety and depression.  Dimension 4:  Readiness to Change:  Dimension 4:  Description of Readiness to Change criteria: Seeking detox treatment.  Dimension 5:  Relapse, Continued use, or Continued Problem Potential:  Dimension 5:  Relapse, continued use, or continued problem potential critiera description: Continued usage.  Dimension 6:  Recovery/Living Environment:  Dimension 6:  Recovery/Iiving environment criteria description: Resides alone  ASAM Severity Score:    ASAM Recommended Level of Treatment: ASAM Recommended Level of Treatment: Level III Residential Treatment   Substance use Disorder (SUD) Substance Use Disorder (SUD)  Checklist Symptoms of Substance Use: Evidence of tolerance, Continued  use despite having a persistent/recurrent physical/psychological problem caused/exacerbated by use, Continued use despite persistent or recurrent social, interpersonal problems, caused or exacerbated by use, Substance(s) often taken in larger amounts or over longer times than was intended, Social, occupational, recreational activities given up or reduced due to use, Recurrent use that results in a failure to fulfill major role obligations (work, school, home), Persistent desire or unsuccessful efforts to cut down or control use, Presence of craving or strong urge to use, Large amounts of time spent to obtain, use or recover from the substance(s)  Recommendations for Services/Supports/Treatments: Recommendations for Services/Supports/Treatments Recommendations For Services/Supports/Treatments: Medication  Management, Detox, Facility Based Crisis, Individual Therapy  Disposition Recommendation per psychiatric provider:  Recommends observation unit.   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) 05/01/2023   Cocaine abuse (HCC) 05/01/2023   Depression, major, single episode, severe (HCC) 05/01/2023   Encounter for immunization 04/14/2023   Substance abuse (HCC) 03/07/2023   Acute purulent bronchitis 02/08/2023   Upper respiratory infection 09/15/2022   Osteoarthritis of left knee 05/09/2022   Excess skin 04/18/2022   Encounter for examination following treatment at hospital 03/27/2022   Annual visit for general adult medical examination with abnormal findings 02/16/2022   Hyperlipidemia associated with type 2 diabetes mellitus (HCC) 10/04/2021   Insomnia 07/11/2021   Depression, major, single episode, moderate (HCC) 07/11/2021   Gallbladder polyp 06/24/2021   Hepatic hemangioma 06/24/2021   ED (erectile dysfunction) 04/20/2021   Low back pain with left-sided sciatica 12/15/2020   Abnormal liver ultrasound 08/16/2020   Abnormal gallbladder ultrasound 08/16/2020   Migraine 08/05/2020   Hepatic lesion 07/15/2020   Chronic diastolic heart failure (HCC) 07/15/2020   Obesity hypoventilation syndrome (HCC)    Difficulty demonstrating health literacy    Colon cancer screening 03/23/2020   Leg edema 03/23/2020   Copious oral secretions    Tracheostomy status (HCC)    Tracheomalacia    Alcohol consumption binge drinking 04/10/2019   Chronic respiratory failure (HCC) 11/14/2018   Hypoxia 09/24/2018   Dyspnea and respiratory abnormalities 09/24/2018   Nocturnal hypoxia 04/15/2018   Vitamin D  deficiency 09/15/2017   Cigarette nicotine  dependence 05/15/2016   Essential hypertension 02/04/2016   Chronic pain 12/08/2015   Chronic venous insufficiency  04/17/2015   Left knee pain 11/03/2014   Dyslipidemia 11/03/2014   Tracheobronchitis 06/26/2014   OSA (obstructive sleep apnea) 03/15/2014   Metabolic syndrome X 09/16/2012   Allergic rhinitis 09/13/2012   Morbid obesity due to excess calories (HCC) 09/08/2008   Cough variant asthma 09/08/2008     Referrals to Alternative Service(s): Referred to Alternative Service(s):   Place:   Date:   Time:    Referred to Alternative Service(s):   Place:   Date:   Time:    Referred to Alternative Service(s):   Place:   Date:   Time:    Referred to Alternative Service(s):   Place:   Date:   Time:     [place] JONETTA Childes, [facility]

## 2023-09-13 NOTE — ED Provider Notes (Addendum)
 Behavioral Health Urgent Care Medical Screening Exam  Patient Name: Shawn Ramsey MRN: [id number] Date of Evaluation: 09/13/23 Chief Complaint:   Diagnosis:  Final diagnoses:  Cocaine abuse (HCC)  Recurrent major depressive disorder, remission status unspecified (HCC)  GAD (generalized anxiety disorder)    History of Present illness: Shawn Ramsey is a 43 y.o. male.  On evaluation this a.m., patient said that he was depressed and looking for detox from cocaine.  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  Patient says that he has supplies for suctioning with him.  Denied suicidal and homicidal ideation.  Patient endorses auditory hallucinations but is extraordinarily vague concerning the content of these voices.  Denies that they are command auditory hallucinations.  Hears them once in a while and they say different things.  Endorses having 3 DWIs some years ago and previous in-person rehab.  On interview, patient noted some difficulty breathing although and feelings of swelling up to his abdomen.  On physical exam, patient appeared to have significant nonpitting edema into the upper extremities. In the setting of continuous cocaine use, shortness of breath and nonpitting edema, it was determined by this Clinical research associate and Dr. Corean Potters that he would require medical clearance.  Also notes that he has not been taking his PO diuretics in some time.  Patient has CHF with retained ejection fraction but has not had an echocardiogram since 2021.  Patient said that he takes his Zoloft  200 mg as needed.  Additionally, patient exhibited a blood pressure of 167/106.  Patient was accepted to [facility] for evaluation, IV Lasix  with plan to return to facility base crisis afterwards.  Flowsheet Row ED from 09/13/2023 in [facility] Emergency Department at [facility] Most recent reading at 09/13/2023 12:10 PM ED from 09/13/2023 in [facility] Most recent reading at 09/13/2023  4:02 AM ED from 07/31/2023 in [facility] Emergency Department at [facility] Most recent reading at 07/31/2023  8:38 PM  C-SSRS RISK CATEGORY No Risk No Risk No Risk    Psychiatric Specialty Exam  Presentation  General Appearance:Casual  Eye Contact:Good  Speech:Clear and Coherent  Speech Volume:Normal  Handedness:Right   Mood and Affect  Mood: Euthymic  Affect: Congruent   Thought Process  Thought Processes: Coherent; Goal Directed; Linear  Descriptions of Associations:Intact  Orientation:Full (Time, Place and Person)  Thought Content:WDL  Diagnosis of Schizophrenia or Schizoaffective disorder in past: No   Hallucinations:Auditory voices saying different things. did not elaborate. denies command  Ideas of Reference:None  Suicidal Thoughts:No  Homicidal Thoughts:No   Sensorium  Memory: Immediate Good; Recent Good; Remote Good  Judgment: Fair  Insight: Poor   Executive Functions  Concentration: Fair  Attention Span: Fair  Recall: Fair  Fund of Knowledge: Fair  Language: Fair   Psychomotor Activity  Psychomotor Activity: Normal   Assets  Assets: Desire for Improvement; Resilience   Sleep  Sleep: Fair  Number of hours:  6   Physical Exam: Physical Exam Vitals reviewed.  Constitutional:      General: He is not in acute distress.    Appearance: He is obese. He is ill-appearing.  Pulmonary:     Effort: Pulmonary effort is normal. No respiratory distress.     Comments: Patient has a trach tube in place, appears clear, no obvious evidence of infection noted Musculoskeletal:        General: Swelling present.  Comments: Nonpitting edema in the lower extremities, abdomen and upper extremities.  Neurological:     Mental Status: He is alert.    Review of Systems  Respiratory:  Positive for shortness of breath.        Patient endorses being short of  breath, although he can maintain a conversation without too much difficulty.  All other systems reviewed and are negative.  Blood pressure (!) 145/88, pulse 73, temperature 97.8 F (36.6 C), resp. rate 17, SpO2 97%. There is no height or weight on file to calculate BMI.  Musculoskeletal: Strength & Muscle Tone: within normal limits Gait & Station: normal Patient leans: N/A   [facility] MSE Discharge Disposition for Follow up and Recommendations: Based on my evaluation the patient appears to have an urgent medical condition for which I recommend the patient be transferred to the emergency department for further evaluation.    Amreen Raczkowski, MD 09/13/2023, 2:09 PM

## 2023-09-13 NOTE — Progress Notes (Signed)
   09/13/23 0217  [facility] Triage Screening (Walk-ins at [facility] only)  What Is the Reason for Your Visit/Call Today? Mr. Mullens arrived to the [facility] seeking help finding a long term facility for detox. He states that he does cocaine, and relapsed about 2-3 months ago due to his depression. He is diagnosed with depression and he currently takes zoloft .  How Long Has This Been Causing You Problems? 1-6 months  Have You Recently Had Any Thoughts About Hurting Yourself? No  Are You Planning to Commit Suicide/Harm Yourself At This time? No  Have you Recently Had Thoughts About Hurting Someone [place]? No  Are You Planning To Harm Someone At This Time? No  Physical Abuse Denies  Verbal Abuse Denies  Sexual Abuse Denies  Exploitation of patient/patient's resources Denies  Self-Neglect Denies  Are you currently experiencing any auditory, visual or other hallucinations? Yes  Please explain the hallucinations you are currently experiencing: Hearing voices and random noises  Have You Used Any Alcohol or Drugs in the Past 24 Hours? Yes  What Did You Use and How Much? Cocaine over a gram  Do you have any current medical co-morbidities that require immediate attention? Yes  Please describe current medical co-morbidities that require immediate attention: Trach in his neck, Potassium levels, cholesterol.  What Do You Feel Would Help You the Most Today? Medication(s);Social Support;Stress Management  Determination of Need Routine (7 days)  Options For Referral [facility];Other: Comment;Therapeutic Triage Services;Facility-Based Crisis

## 2023-09-13 NOTE — Progress Notes (Signed)
 Pt's BP was elevated and documented in flowsheet. Pt is asymptomatic. Dr. Rollene  was notified in person, no new orders. Pt' BP was rechecked.

## 2023-09-13 NOTE — ED Notes (Addendum)
 Patient belongings ( 1 bag) placed in the 1-4 nurses station.

## 2023-09-13 NOTE — Progress Notes (Signed)
 Report called to Patty, CN [facility].

## 2023-09-13 NOTE — ED Triage Notes (Signed)
 Pt BIB safe transport from [facility] for a medical clearance. Pt has been having HTN and swelling in his lower legs. Pt reports not taking his fluid pills x 1 month.

## 2023-09-13 NOTE — Discharge Instructions (Addendum)

## 2023-09-13 NOTE — ED Provider Notes (Signed)
 [place] EMERGENCY DEPARTMENT AT [facility] Provider Note   CSN: [id number] Arrival date & time: 09/13/23  1156     Patient presents with: Medical Clearance   Shawn Ramsey is a 43 y.o. male.   Patient sent in from behavioral health urgent care.  For medical clearance.  Patient supposedly was having hypertension and swelling in lower legs.  They thought that he had a history of congestive heart failure.  Apparently's been taking fluid pills for a month.  They were concerned that he had too many complicating factors.  Patient's blood pressure here 142/89 temp 98.3 pulse 83 respirations 14 oxygen  saturation of the room air 97%.  Past medical history significant for psychotic disorder in 2009.  Patient has a longstanding trach.  As well.  Cognitive developmental delay learning disability morbid obesity sleep apnea type 2 diabetes depression history of migraines COPD patient smokes cigars some days patient being evaluated by behavioral health for cocaine abuse.       Prior to Admission medications   Medication Sig Start Date End Date Taking? Authorizing Provider  albuterol  (VENTOLIN  HFA) 108 (90 Base) MCG/ACT inhaler Inhale 1-2 puffs into the lungs every 6 (six) hours as needed for wheezing or shortness of breath. 08/26/21   Antonetta Rollene BRAVO, MD  atorvastatin  (LIPITOR) 40 MG tablet Take 1 tablet by mouth once daily 05/15/23   Antonetta Rollene BRAVO, MD  Cholecalciferol (VITAMIN D3 PO) Take 1 tablet by mouth daily.    [provider]  potassium chloride  SA (KLOR-CON  M) 20 MEQ tablet Take 1 tablet (20 mEq total) by mouth 2 (two) times daily. 06/28/23   Antonetta Rollene BRAVO, MD  PULMICORT  0.25 MG/2ML nebulizer solution Take 2 mLs (0.25 mg total) by nebulization in the morning and at bedtime. Patient not taking: Reported on 09/13/2023 04/22/21   Sood, Vineet, MD  sertraline  (ZOLOFT ) 100 MG tablet Take 2 tablets (200 mg total) by mouth daily. 06/28/23   Antonetta Rollene BRAVO, MD   tadalafil  (CIALIS ) 20 MG tablet TAKE 1/2 TO 1 (ONE-HALF TO ONE) TABLET BY MOUTH EVERY OTHER DAY AS NEEDED FOR ERECTILE DYSFUNCTION Patient taking differently: Take 10-20 mg by mouth as directed. Take every other day as needed for erectile dysfunction 06/26/23   Antonetta Rollene BRAVO, MD  torsemide  (DEMADEX ) 20 MG tablet Take 1 tablet (20 mg total) by mouth 2 (two) times daily. 06/28/23   Antonetta Rollene BRAVO, MD  WEGOVY  0.5 MG/0.5ML SOAJ Inject 0.5 mg into the skin once a week. Patient taking differently: Inject 0.5 mg into the skin every Wednesday. 06/08/23   Antonetta Rollene BRAVO, MD    Allergies: Patient has no known allergies.    Review of Systems  Constitutional:  Negative for chills and fever.  HENT:  Negative for ear pain and sore throat.   Eyes:  Negative for pain and visual disturbance.  Respiratory:  Negative for cough and shortness of breath.   Cardiovascular:  Positive for leg swelling. Negative for chest pain and palpitations.  Gastrointestinal:  Negative for abdominal pain and vomiting.  Genitourinary:  Negative for dysuria and hematuria.  Musculoskeletal:  Negative for arthralgias and back pain.  Skin:  Negative for color change and rash.  Neurological:  Negative for seizures and syncope.  All other systems reviewed and are negative.   Updated Vital Signs BP 123/78   Pulse 69   Temp 98.2 F (36.8 C)   Resp 20   SpO2 95%   Physical Exam Vitals and nursing  note reviewed.  Constitutional:      General: He is not in acute distress.    Appearance: Normal appearance. He is well-developed.  HENT:     Head: Normocephalic and atraumatic.  Eyes:     Extraocular Movements: Extraocular movements intact.     Conjunctiva/sclera: Conjunctivae normal.     Pupils: Pupils are equal, round, and reactive to light.  Cardiovascular:     Rate and Rhythm: Normal rate and regular rhythm.     Heart sounds: No murmur heard. Pulmonary:     Effort: Pulmonary effort is normal. No respiratory  distress.     Breath sounds: Normal breath sounds. No wheezing, rhonchi or rales.  Abdominal:     Palpations: Abdomen is soft.     Tenderness: There is no abdominal tenderness.  Musculoskeletal:        General: No swelling.     Cervical back: Normal range of motion and neck supple.     Right lower leg: Edema present.     Left lower leg: Edema present.  Skin:    General: Skin is warm and dry.     Capillary Refill: Capillary refill takes less than 2 seconds.  Neurological:     General: No focal deficit present.     Mental Status: He is alert. Mental status is at baseline.  Psychiatric:        Mood and Affect: Mood normal.     (all labs ordered are listed, but only abnormal results are displayed) Labs Reviewed  CBC WITH DIFFERENTIAL/PLATELET - Abnormal; Notable for the following components:      Result Value   RDW 15.8 (*)    All other components within normal limits  COMPREHENSIVE METABOLIC PANEL WITH GFR - Abnormal; Notable for the following components:   CO2 21 (*)    Creatinine, Ser 0.58 (*)    Calcium  8.7 (*)    AST 14 (*)    All other components within normal limits  BRAIN NATRIURETIC PEPTIDE  URINALYSIS, ROUTINE W REFLEX MICROSCOPIC  RAPID URINE DRUG SCREEN, HOSP PERFORMED  ETHANOL    EKG: EKG Interpretation Date/Time:  Wednesday September 13 2023 13:40:36 EDT Ventricular Rate:  70 PR Interval:  145 QRS Duration:  146 QT Interval:  440 QTC Calculation: 475 R Axis:   -65  Text Interpretation: Sinus rhythm RBBB and LAFB No significant change since last tracing Confirmed by Sadao Weyer [phone]) on 09/13/2023 4:20:40 PM  Radiology: [facility] Chest Port 1 View Result Date: 09/13/2023 CLINICAL DATA:  Congestive heart failure. Hypertension and swelling in the lower extremities. EXAM: PORTABLE CHEST 1 VIEW COMPARISON:  07/31/2023 FINDINGS: Tracheostomy tube projects over the tracheal air column. The patient is rotated to the right on today's radiograph, reducing diagnostic  sensitivity and specificity. The lungs appear clear. Borderline enlargement of the cardiopericardial silhouette, without edema. No blunting of the costophrenic angles. No significant bony findings. IMPRESSION: 1. Borderline enlargement of the cardiopericardial silhouette, without edema. 2. Tracheostomy tube projects over the tracheal air column. Electronically Signed   By: Ryan Salvage M.D.   On: 09/13/2023 13:12     Procedures   Medications Ordered in the ED - No data to display                                  Medical Decision Making Amount and/or Complexity of Data Reviewed Labs: ordered. Radiology: ordered.   CBC white count 4.9 hemoglobin 13.5.  Platelets 290.  Complete metabolic panel CO2 down a little bit at 21 blood sugars 91 on GFR is greater than 60.  LFTs are normal.  Anion gap normal.  BNP 33.1 so no evidence of any significant CHF.  Not sure patient has a history of CHF.  Chest x-ray borderline enlargement of the cardio pericardial silhouette without any edema.  Tracheostomy tube projects over the tracheal air column so it is in the right place.  Patient's EKG showed bundle branch block no change from previous.  Patient's alcohol level urinalysis urine drug screen still pending.  Will review the behavioral health urgent care labs.  May not need these repeated.  Patient's alcohol level was done at 3 in the morning.  It was less than 15.  And urine drug screen was positive for cocaine.  Patient clinically doing very well.  Patient medically cleared and can be discharged back to the behavioral unit urgent care.     Final diagnoses:  Cocaine abuse [facility])    ED Discharge Orders     None          Geraldene Hamilton, MD 09/13/23 1642

## 2023-09-13 NOTE — Progress Notes (Signed)
 Pt is awake, alert and oriented X3. Pt did not voice any complaints of pain or discomforts. No signs of acute distress noted. Pt denies current SI/HI/AVH, plan or intent. Staff will monitor for pt's safety.

## 2023-09-13 NOTE — ED Provider Notes (Signed)
 [facility] Continuous Assessment Admission H&P  Date: 09/13/23 Patient Name: Shawn Ramsey MRN: [id number] Chief Complaint: detox  Diagnoses:  Final diagnoses:  Cocaine abuse (HCC)  Recurrent major depressive disorder, remission status unspecified (HCC)    HPI: Shawn Ramsey, 43 y/o male with a history of cocaine abuse, MDD, presented to [facility], he was sent out to the ED for medical clearance.  Patient was medically cleared.  According to patient he wants to leave because he was going to talk to his outpatient provider who will get him into a long-term rehab center.  Discussed with patient that he should discuss with the doctor tomorrow his concern of wanting to leave.  But for tonight he should just staying observation and talk with a doctor in the a.m.  Copied from ED notes: Patient sent in from behavioral health urgent care.  For medical clearance.  Patient supposedly was having hypertension and swelling in lower legs.  They thought that he had a history of congestive heart failure.  Apparently's been taking fluid pills for a month.  They were concerned that he had too many complicating factors.  Patient's blood pressure here 142/89 temp 98.3 pulse 83 respirations 14 oxygen  saturation of the room air 97%.  Past medical history significant for psychotic disorder in 2009.  Patient has a longstanding trach.  As well.   Cognitive developmental delay learning disability morbid obesity sleep apnea type 2 diabetes depression history of migraines COPD patient smokes cigars some days patient being evaluated by behavioral health for cocaine abuse.   The face evaluation of patient patient is alert and oriented x 4, speech is clear maintain eye contact.  Patient currently denies SI, HI, AVH or paranoia. Patient expressed desire to be discharged stating that he did not want to stay anymore he wanted to talk with his outpatient provider who will get him into a rehab facility.  Total Time spent  with patient: 15 minutes  Musculoskeletal  Strength & Muscle Tone: within normal limits Gait & Station: normal Patient leans: N/A  Psychiatric Specialty Exam  Presentation General Appearance:  Appropriate for Environment  Eye Contact: Good  Speech: Clear and Coherent  Speech Volume: Normal  Handedness: Right   Mood and Affect  Mood: Depressed  Affect: Congruent   Thought Process  Thought Processes: Coherent  Descriptions of Associations:Intact  Orientation:Full (Time, Place and Person)  Thought Content:WDL  Diagnosis of Schizophrenia or Schizoaffective disorder in past: No   Hallucinations:Hallucinations: None Description of Auditory Hallucinations: voices saying different things. did not elaborate. denies command  Ideas of Reference:None  Suicidal Thoughts:Suicidal Thoughts: No  Homicidal Thoughts:Homicidal Thoughts: No   Sensorium  Memory: Immediate Fair  Judgment: Fair  Insight: Fair   Art therapist  Concentration: Fair  Attention Span: Fair  Recall: Fair  Fund of Knowledge: Fair  Language: Fair   Psychomotor Activity  Psychomotor Activity: Psychomotor Activity: Normal   Assets  Assets: Desire for Improvement   Sleep  Sleep: Sleep: Fair Number of Hours of Sleep: 6   Nutritional Assessment (For OBS and FBC admissions only) Has the patient had a weight loss or gain of 10 pounds or more in the last 3 months?: No Has the patient had a decrease in food intake/or appetite?: No Does the patient have dental problems?: No Does the patient have eating habits or behaviors that may be indicators of an eating disorder including binging or inducing vomiting?: No Has the patient recently lost weight without trying?: 0 Has the patient  been eating poorly because of a decreased appetite?: 0 Malnutrition Screening Tool Score: 0    Physical Exam HENT:     Head: Normocephalic.     Nose: Nose normal.  Eyes:      Pupils: Pupils are equal, round, and reactive to light.  Cardiovascular:     Rate and Rhythm: Normal rate.  Pulmonary:     Effort: Pulmonary effort is normal.  Musculoskeletal:        General: Normal range of motion.     Cervical back: Normal range of motion.  Neurological:     General: No focal deficit present.     Mental Status: He is alert.  Psychiatric:        Mood and Affect: Mood normal.        Thought Content: Thought content normal.    Review of Systems  Constitutional: Negative.   HENT: Negative.    Eyes: Negative.   Respiratory: Negative.    Cardiovascular: Negative.   Gastrointestinal: Negative.   Genitourinary: Negative.   Musculoskeletal: Negative.   Skin: Negative.   Neurological: Negative.   Psychiatric/Behavioral:  Positive for substance abuse. The patient is nervous/anxious.     Blood pressure 135/79, pulse 78, temperature 98.9 F (37.2 C), temperature source Oral, resp. rate 19, SpO2 99%. There is no height or weight on file to calculate BMI.  Past Psychiatric History: MDD, substance abuse, alcohol abuse  Is the patient at risk to self? No  Has the patient been a risk to self in the past 6 months? Yes .    Has the patient been a risk to self within the distant past? No   Is the patient a risk to others? No   Has the patient been a risk to others in the past 6 months? No   Has the patient been a risk to others within the distant past? No   Past Medical History: See chart  Family History: Unknown  Social History: Cocaine, alcohol  Last Labs:  Admission on 09/13/2023, Discharged on 09/13/2023  Component Date Value Ref Range Status   WBC 09/13/2023 4.9  4.0 - 10.5 K/uL Final   RBC 09/13/2023 4.42  4.22 - 5.81 MIL/uL Final   Hemoglobin 09/13/2023 13.5  13.0 - 17.0 g/dL Final   HCT 92/76/7974 39.8  39.0 - 52.0 % Final   MCV 09/13/2023 90.0  80.0 - 100.0 fL Final   MCH 09/13/2023 30.5  26.0 - 34.0 pg Final   MCHC 09/13/2023 33.9  30.0 - 36.0 g/dL  Final   RDW 92/76/7974 15.8 (H)  11.5 - 15.5 % Final   Platelets 09/13/2023 290  150 - 400 K/uL Final   nRBC 09/13/2023 0.0  0.0 - 0.2 % Final   Neutrophils Relative % 09/13/2023 55  % Final   Neutro Abs 09/13/2023 2.7  1.7 - 7.7 K/uL Final   Lymphocytes Relative 09/13/2023 34  % Final   Lymphs Abs 09/13/2023 1.7  0.7 - 4.0 K/uL Final   Monocytes Relative 09/13/2023 10  % Final   Monocytes Absolute 09/13/2023 0.5  0.1 - 1.0 K/uL Final   Eosinophils Relative 09/13/2023 1  % Final   Eosinophils Absolute 09/13/2023 0.1  0.0 - 0.5 K/uL Final   Basophils Relative 09/13/2023 0  % Final   Basophils Absolute 09/13/2023 0.0  0.0 - 0.1 K/uL Final   Immature Granulocytes 09/13/2023 0  % Final   Abs Immature Granulocytes 09/13/2023 0.02  0.00 - 0.07 K/uL Final   Performed  at [facility], [address] [place], [place] [address]   Sodium 09/13/2023 141  135 - 145 mmol/L Final   Potassium 09/13/2023 3.7  3.5 - 5.1 mmol/L Final   Chloride 09/13/2023 109  98 - 111 mmol/L Final   CO2 09/13/2023 21 (L)  22 - 32 mmol/L Final   Glucose, Bld 09/13/2023 91  70 - 99 mg/dL Final   Glucose reference range applies only to samples taken after fasting for at least 8 hours.   BUN 09/13/2023 13  6 - 20 mg/dL Final   Creatinine, Ser 09/13/2023 0.58 (L)  0.61 - 1.24 mg/dL Final   Calcium  09/13/2023 8.7 (L)  8.9 - 10.3 mg/dL Final   Total Protein 92/76/7974 7.2  6.5 - 8.1 g/dL Final   Albumin  09/13/2023 3.7  3.5 - 5.0 g/dL Final   AST 92/76/7974 14 (L)  15 - 41 U/L Final   ALT 09/13/2023 16  0 - 44 U/L Final   Alkaline Phosphatase 09/13/2023 64  38 - 126 U/L Final   Total Bilirubin 09/13/2023 0.8  0.0 - 1.2 mg/dL Final   GFR, Estimated 09/13/2023 >60  >60 mL/min Final   Comment: (NOTE) Calculated using the CKD-EPI Creatinine Equation (2021)    Anion gap 09/13/2023 11  5 - 15 Final   Performed at [facility], [address] [place], [place] [address]   B Natriuretic  Peptide 09/13/2023 33.1  0.0 - 100.0 pg/mL Final   Performed at [facility], [address], [place], [place] [address]   Alcohol, Ethyl (B) 09/13/2023 <15  <15 mg/dL Final   Comment: (NOTE) For medical purposes only. Performed at [facility], [address] [place], [place] [address]   Admission on 09/13/2023, Discharged on 09/13/2023  Component Date Value Ref Range Status   WBC 09/13/2023 7.6  4.0 - 10.5 K/uL Final   RBC 09/13/2023 4.70  4.22 - 5.81 MIL/uL Final   Hemoglobin 09/13/2023 14.6  13.0 - 17.0 g/dL Final   HCT 92/76/7974 41.9  39.0 - 52.0 % Final   MCV 09/13/2023 89.1  80.0 - 100.0 fL Final   MCH 09/13/2023 31.1  26.0 - 34.0 pg Final   MCHC 09/13/2023 34.8  30.0 - 36.0 g/dL Final   RDW 92/76/7974 15.5  11.5 - 15.5 % Final   Platelets 09/13/2023 301  150 - 400 K/uL Final   nRBC 09/13/2023 0.0  0.0 - 0.2 % Final   Neutrophils Relative % 09/13/2023 54  % Final   Neutro Abs 09/13/2023 4.1  1.7 - 7.7 K/uL Final   Lymphocytes Relative 09/13/2023 37  % Final   Lymphs Abs 09/13/2023 2.8  0.7 - 4.0 K/uL Final   Monocytes Relative 09/13/2023 7  % Final   Monocytes Absolute 09/13/2023 0.5  0.1 - 1.0 K/uL Final   Eosinophils Relative 09/13/2023 1  % Final   Eosinophils Absolute 09/13/2023 0.1  0.0 - 0.5 K/uL Final   Basophils Relative 09/13/2023 1  % Final   Basophils Absolute 09/13/2023 0.1  0.0 - 0.1 K/uL Final   Immature Granulocytes 09/13/2023 0  % Final   Abs Immature Granulocytes 09/13/2023 0.03  0.00 - 0.07 K/uL Final   Performed at [facility], [address], [place], [place] [address]   Sodium 09/13/2023 140  135 - 145 mmol/L Final   Potassium 09/13/2023 3.9  3.5 - 5.1 mmol/L Final   Chloride 09/13/2023 107  98 - 111  mmol/L Final   CO2 09/13/2023 20 (L)  22 - 32 mmol/L Final   Glucose, Bld 09/13/2023 95  70 - 99 mg/dL Final   Glucose reference range applies only to samples taken after fasting for at least 8 hours.   BUN  09/13/2023 15  6 - 20 mg/dL Final   Creatinine, Ser 09/13/2023 0.78  0.61 - 1.24 mg/dL Final   Calcium  09/13/2023 9.1  8.9 - 10.3 mg/dL Final   Total Protein 92/76/7974 6.8  6.5 - 8.1 g/dL Final   Albumin  09/13/2023 3.9  3.5 - 5.0 g/dL Final   AST 92/76/7974 15  15 - 41 U/L Final   ALT 09/13/2023 17  0 - 44 U/L Final   Alkaline Phosphatase 09/13/2023 69  38 - 126 U/L Final   Total Bilirubin 09/13/2023 0.5  0.0 - 1.2 mg/dL Final   GFR, Estimated 09/13/2023 >60  >60 mL/min Final   Comment: (NOTE) Calculated using the CKD-EPI Creatinine Equation (2021)    Anion gap 09/13/2023 13  5 - 15 Final   Performed at [facility], [address], [place], [place] [address]   Alcohol, Ethyl (B) 09/13/2023 <15  <15 mg/dL Final   Comment: (NOTE) For medical purposes only. Performed at [facility], [address] [place], [place] [address]    TSH 09/13/2023 3.670  0.350 - 4.500 uIU/mL Final   Comment: Performed by a 3rd Generation assay with a functional sensitivity of <=0.01 uIU/mL. Performed at [facility], [address], [place], [place] [address]    POC Amphetamine UR 09/13/2023 None Detected  NONE DETECTED (Cut Off Level 1000 ng/mL) Final   POC Secobarbital (BAR) 09/13/2023 None Detected  NONE DETECTED (Cut Off Level 300 ng/mL) Final   POC Buprenorphine (BUP) 09/13/2023 None Detected  NONE DETECTED (Cut Off Level 10 ng/mL) Final   POC Oxazepam (BZO) 09/13/2023 None Detected  NONE DETECTED (Cut Off Level 300 ng/mL) Final   POC Cocaine UR 09/13/2023 Positive (A)  NONE DETECTED (Cut Off Level 300 ng/mL) Final   POC Methamphetamine UR 09/13/2023 None Detected  NONE DETECTED (Cut Off Level 1000 ng/mL) Final   POC Morphine 09/13/2023 None Detected  NONE DETECTED (Cut Off Level 300 ng/mL) Final   POC Methadone UR 09/13/2023 None Detected  NONE DETECTED (Cut Off Level 300 ng/mL) Final   POC Oxycodone  UR 09/13/2023 None Detected  NONE DETECTED (Cut Off Level 100 ng/mL) Final   POC  Marijuana UR 09/13/2023 None Detected  NONE DETECTED (Cut Off Level 50 ng/mL) Final  Admission on 07/31/2023, Discharged on 07/31/2023  Component Date Value Ref Range Status   Sodium 07/31/2023 142  135 - 145 mmol/L Final   Potassium 07/31/2023 3.9  3.5 - 5.1 mmol/L Final   Chloride 07/31/2023 111  98 - 111 mmol/L Final   CO2 07/31/2023 23  22 - 32 mmol/L Final   Glucose, Bld 07/31/2023 85  70 - 99 mg/dL Final   Glucose reference range applies only to samples taken after fasting for at least 8 hours.   BUN 07/31/2023 16  6 - 20 mg/dL Final   Creatinine, Ser 07/31/2023 0.76  0.61 - 1.24 mg/dL Final   Calcium  07/31/2023 8.7 (L)  8.9 - 10.3 mg/dL Final   GFR, Estimated 07/31/2023 >60  >60 mL/min Final   Comment: (NOTE) Calculated using the CKD-EPI Creatinine Equation (2021)    Anion gap 07/31/2023 8  5 - 15 Final   Performed at [facility], [address] [place], [place] [address]   WBC 07/31/2023 10.4  4.0 - 10.5 K/uL Final   RBC 07/31/2023 4.30  4.22 - 5.81 MIL/uL Final   Hemoglobin 07/31/2023 12.9 (L)  13.0 - 17.0 g/dL Final   HCT 93/90/7974 38.5 (L)  39.0 - 52.0 % Final   MCV 07/31/2023 89.5  80.0 - 100.0 fL Final   MCH 07/31/2023 30.0  26.0 - 34.0 pg Final   MCHC 07/31/2023 33.5  30.0 - 36.0 g/dL Final   RDW 93/90/7974 14.2  11.5 - 15.5 % Final   Platelets 07/31/2023 262  150 - 400 K/uL Final   nRBC 07/31/2023 0.0  0.0 - 0.2 % Final   Performed at [facility], [address] [place], [place] [address]   Group A Strep by PCR 07/31/2023 DETECTED (A)  NOT DETECTED Final   Performed at [facility], [address] [place], [place] [address]  Admission on 06/30/2023, Discharged on 07/01/2023  Component Date Value Ref Range Status   Lipase 06/30/2023 49  11 - 51 U/L Final   Performed at [facility], [address], [place], [place] [address]   Sodium 06/30/2023 142  135 - 145 mmol/L Final   Potassium 06/30/2023 4.2  3.5 - 5.1 mmol/L Final   Chloride 06/30/2023 110  98 - 111  mmol/L Final   CO2 06/30/2023 24  22 - 32 mmol/L Final   Glucose, Bld 06/30/2023 94  70 - 99 mg/dL Final   Glucose reference range applies only to samples taken after fasting for at least 8 hours.   BUN 06/30/2023 19  6 - 20 mg/dL Final   Creatinine, Ser 06/30/2023 0.78  0.61 - 1.24 mg/dL Final   Calcium  06/30/2023 9.1  8.9 - 10.3 mg/dL Final   Total Protein 94/90/7974 7.2  6.5 - 8.1 g/dL Final   Albumin  06/30/2023 3.5  3.5 - 5.0 g/dL Final   AST 94/90/7974 29  15 - 41 U/L Final   ALT 06/30/2023 47 (H)  0 - 44 U/L Final   Alkaline Phosphatase 06/30/2023 57  38 - 126 U/L Final   Total Bilirubin 06/30/2023 0.3  0.0 - 1.2 mg/dL Final   GFR, Estimated 06/30/2023 >60  >60 mL/min Final   Comment: (NOTE) Calculated using the CKD-EPI Creatinine Equation (2021)    Anion gap 06/30/2023 8  5 - 15 Final   Performed at [facility], [address] [place], [place] [address]   WBC 06/30/2023 4.9  4.0 - 10.5 K/uL Final   RBC 06/30/2023 4.60  4.22 - 5.81 MIL/uL Final   Hemoglobin 06/30/2023 14.2  13.0 - 17.0 g/dL Final   HCT 94/90/7974 41.9  39.0 - 52.0 % Final   MCV 06/30/2023 91.1  80.0 - 100.0 fL Final   MCH 06/30/2023 30.9  26.0 - 34.0 pg Final   MCHC 06/30/2023 33.9  30.0 - 36.0 g/dL Final   RDW 94/90/7974 14.4  11.5 - 15.5 % Final   Platelets 06/30/2023 284  150 - 400 K/uL Final   nRBC 06/30/2023 0.0  0.0 - 0.2 % Final   Performed at [facility], [address], [place], [place] [address]   Color, Urine 06/30/2023 YELLOW  YELLOW Final   APPearance 06/30/2023 CLEAR  CLEAR Final   Specific Gravity, Urine 06/30/2023 1.026  1.005 - 1.030 Final   pH 06/30/2023 7.0  5.0 - 8.0 Final   Glucose, UA 06/30/2023 NEGATIVE  NEGATIVE mg/dL Final  Hgb urine dipstick 06/30/2023 NEGATIVE  NEGATIVE Final   Bilirubin Urine 06/30/2023 NEGATIVE  NEGATIVE Final   Ketones, ur 06/30/2023 NEGATIVE  NEGATIVE mg/dL Final   Protein, ur 94/90/7974 NEGATIVE  NEGATIVE mg/dL Final   Nitrite 94/90/7974  NEGATIVE  NEGATIVE Final   Leukocytes,Ua 06/30/2023 NEGATIVE  NEGATIVE Final   Performed at [facility], [address] [place], [place] [address]   Alcohol, Ethyl (B) 06/30/2023 <15  <15 mg/dL Final   Comment: Please note change in reference range. (NOTE) For medical purposes only. Performed at [facility], [address], [place], [place] [address]    Opiates 06/30/2023 NONE DETECTED  NONE DETECTED Final   Cocaine 06/30/2023 POSITIVE (A)  NONE DETECTED Final   Benzodiazepines 06/30/2023 NONE DETECTED  NONE DETECTED Final   Amphetamines 06/30/2023 NONE DETECTED  NONE DETECTED Final   Tetrahydrocannabinol 06/30/2023 NONE DETECTED  NONE DETECTED Final   Barbiturates 06/30/2023 NONE DETECTED  NONE DETECTED Final   Comment: (NOTE) DRUG SCREEN FOR MEDICAL PURPOSES ONLY.  IF CONFIRMATION IS NEEDED FOR ANY PURPOSE, NOTIFY LAB WITHIN 5 DAYS.  LOWEST DETECTABLE LIMITS FOR URINE DRUG SCREEN Drug Class                     Cutoff (ng/mL) Amphetamine and metabolites    1000 Barbiturate and metabolites    200 Benzodiazepine                 200 Opiates and metabolites        300 Cocaine and metabolites        300 THC                            50 Performed at [facility], [address], [place], [place] [address]   Admission on 04/27/2023, Discharged on 04/27/2023  Component Date Value Ref Range Status   Sodium 04/27/2023 140  135 - 145 mmol/L Final   Potassium 04/27/2023 3.7  3.5 - 5.1 mmol/L Final   Chloride 04/27/2023 109  98 - 111 mmol/L Final   CO2 04/27/2023 23  22 - 32 mmol/L Final   Glucose, Bld 04/27/2023 105 (H)  70 - 99 mg/dL Final   Glucose reference range applies only to samples taken after fasting for at least 8 hours.   BUN 04/27/2023 15  6 - 20 mg/dL Final   Creatinine, Ser 04/27/2023 0.78  0.61 - 1.24 mg/dL Final   Calcium  04/27/2023 8.4 (L)  8.9 - 10.3 mg/dL Final   Total Protein 96/93/7974 6.9  6.5 - 8.1 g/dL Final   Albumin  04/27/2023 3.8   3.5 - 5.0 g/dL Final   AST 96/93/7974 17  15 - 41 U/L Final   ALT 04/27/2023 13  0 - 44 U/L Final   Alkaline Phosphatase 04/27/2023 58  38 - 126 U/L Final   Total Bilirubin 04/27/2023 0.5  0.0 - 1.2 mg/dL Final   GFR, Estimated 04/27/2023 >60  >60 mL/min Final   Comment: (NOTE) Calculated using the CKD-EPI Creatinine Equation (2021)    Anion gap 04/27/2023 8  5 - 15 Final   Performed at [facility], [address], [place], [place] [address]   Alcohol, Ethyl (B) 04/27/2023 <10  <10 mg/dL Final   Comment: (NOTE) Lowest detectable limit for serum alcohol is 10 mg/dL.  For medical purposes only. Performed at [facility], [address] [place], [place] [address]  Opiates 04/27/2023 NONE DETECTED  NONE DETECTED Final   Cocaine 04/27/2023 POSITIVE (A)  NONE DETECTED Final   Benzodiazepines 04/27/2023 NONE DETECTED  NONE DETECTED Final   Amphetamines 04/27/2023 NONE DETECTED  NONE DETECTED Final   Tetrahydrocannabinol 04/27/2023 NONE DETECTED  NONE DETECTED Final   Barbiturates 04/27/2023 NONE DETECTED  NONE DETECTED Final   Comment: (NOTE) DRUG SCREEN FOR MEDICAL PURPOSES ONLY.  IF CONFIRMATION IS NEEDED FOR ANY PURPOSE, NOTIFY LAB WITHIN 5 DAYS.  LOWEST DETECTABLE LIMITS FOR URINE DRUG SCREEN Drug Class                     Cutoff (ng/mL) Amphetamine and metabolites    1000 Barbiturate and metabolites    200 Benzodiazepine                 200 Opiates and metabolites        300 Cocaine and metabolites        300 THC                            50 Performed at [facility], [address], [place], [place] [address]    WBC 04/27/2023 5.6  4.0 - 10.5 K/uL Final   RBC 04/27/2023 4.49  4.22 - 5.81 MIL/uL Final   Hemoglobin 04/27/2023 13.9  13.0 - 17.0 g/dL Final   HCT 96/93/7974 41.0  39.0 - 52.0 % Final   MCV 04/27/2023 91.3  80.0 - 100.0 fL Final   MCH 04/27/2023 31.0  26.0 - 34.0 pg Final   MCHC 04/27/2023 33.9  30.0 - 36.0 g/dL Final   RDW 96/93/7974 15.5  11.5 - 15.5 %  Final   Platelets 04/27/2023 305  150 - 400 K/uL Final   nRBC 04/27/2023 0.0  0.0 - 0.2 % Final   Neutrophils Relative % 04/27/2023 55  % Final   Neutro Abs 04/27/2023 3.1  1.7 - 7.7 K/uL Final   Lymphocytes Relative 04/27/2023 35  % Final   Lymphs Abs 04/27/2023 2.0  0.7 - 4.0 K/uL Final   Monocytes Relative 04/27/2023 7  % Final   Monocytes Absolute 04/27/2023 0.4  0.1 - 1.0 K/uL Final   Eosinophils Relative 04/27/2023 2  % Final   Eosinophils Absolute 04/27/2023 0.1  0.0 - 0.5 K/uL Final   Basophils Relative 04/27/2023 1  % Final   Basophils Absolute 04/27/2023 0.0  0.0 - 0.1 K/uL Final   Immature Granulocytes 04/27/2023 0  % Final   Abs Immature Granulocytes 04/27/2023 0.02  0.00 - 0.07 K/uL Final   Performed at [facility], [address], [place], [place] [address]   Color, Urine 04/27/2023 YELLOW  YELLOW Final   APPearance 04/27/2023 CLEAR  CLEAR Final   Specific Gravity, Urine 04/27/2023 1.026  1.005 - 1.030 Final   pH 04/27/2023 6.0  5.0 - 8.0 Final   Glucose, UA 04/27/2023 NEGATIVE  NEGATIVE mg/dL Final   Hgb urine dipstick 04/27/2023 NEGATIVE  NEGATIVE Final   Bilirubin Urine 04/27/2023 NEGATIVE  NEGATIVE Final   Ketones, ur 04/27/2023 NEGATIVE  NEGATIVE mg/dL Final   Protein, ur 96/93/7974 NEGATIVE  NEGATIVE mg/dL Final   Nitrite 96/93/7974 NEGATIVE  NEGATIVE Final   Leukocytes,Ua 04/27/2023 NEGATIVE  NEGATIVE Final   Performed at [facility], [address] [place], [place] [address]  Admission on 04/07/2023, Discharged on 04/07/2023  Component Date Value Ref Range Status   Sodium 04/07/2023 140  135 - 145  mmol/L Final   Potassium 04/07/2023 3.9  3.5 - 5.1 mmol/L Final   Chloride 04/07/2023 107  98 - 111 mmol/L Final   CO2 04/07/2023 23  22 - 32 mmol/L Final   Glucose, Bld 04/07/2023 92  70 - 99 mg/dL Final   Glucose reference range applies only to samples taken after fasting for at least 8 hours.   BUN 04/07/2023 11  6 - 20 mg/dL Final   Creatinine, Ser 04/07/2023  0.67  0.61 - 1.24 mg/dL Final   Calcium  04/07/2023 8.7 (L)  8.9 - 10.3 mg/dL Final   Total Protein 97/85/7974 6.5  6.5 - 8.1 g/dL Final   Albumin  04/07/2023 3.5  3.5 - 5.0 g/dL Final   AST 97/85/7974 15  15 - 41 U/L Final   ALT 04/07/2023 15  0 - 44 U/L Final   Alkaline Phosphatase 04/07/2023 52  38 - 126 U/L Final   Total Bilirubin 04/07/2023 0.4  0.0 - 1.2 mg/dL Final   GFR, Estimated 04/07/2023 >60  >60 mL/min Final   Comment: (NOTE) Calculated using the CKD-EPI Creatinine Equation (2021)    Anion gap 04/07/2023 10  5 - 15 Final   Performed at [facility], [address], [place], [place] [address]   Alcohol, Ethyl (B) 04/07/2023 <10  <10 mg/dL Final   Comment: (NOTE) Lowest detectable limit for serum alcohol is 10 mg/dL.  For medical purposes only. Performed at [facility], [address] [place], [place] [address]    WBC 04/07/2023 6.1  4.0 - 10.5 K/uL Final   RBC 04/07/2023 4.21 (L)  4.22 - 5.81 MIL/uL Final   Hemoglobin 04/07/2023 12.8 (L)  13.0 - 17.0 g/dL Final   HCT 97/85/7974 38.6 (L)  39.0 - 52.0 % Final   MCV 04/07/2023 91.7  80.0 - 100.0 fL Final   MCH 04/07/2023 30.4  26.0 - 34.0 pg Final   MCHC 04/07/2023 33.2  30.0 - 36.0 g/dL Final   RDW 97/85/7974 14.9  11.5 - 15.5 % Final   Platelets 04/07/2023 306  150 - 400 K/uL Final   nRBC 04/07/2023 0.0  0.0 - 0.2 % Final   Performed at [facility], [address], [place], [place] [address]   Opiates 04/07/2023 NONE DETECTED  NONE DETECTED Final   Cocaine 04/07/2023 POSITIVE (A)  NONE DETECTED Final   Benzodiazepines 04/07/2023 NONE DETECTED  NONE DETECTED Final   Amphetamines 04/07/2023 NONE DETECTED  NONE DETECTED Final   Tetrahydrocannabinol 04/07/2023 NONE DETECTED  NONE DETECTED Final   Barbiturates 04/07/2023 NONE DETECTED  NONE DETECTED Final   Comment: (NOTE) DRUG SCREEN FOR MEDICAL PURPOSES ONLY.  IF CONFIRMATION IS NEEDED FOR ANY PURPOSE, NOTIFY LAB WITHIN 5 DAYS.  LOWEST  DETECTABLE LIMITS FOR URINE DRUG SCREEN Drug Class                     Cutoff (ng/mL) Amphetamine and metabolites    1000 Barbiturate and metabolites    200 Benzodiazepine                 200 Opiates and metabolites        300 Cocaine and metabolites        300 THC                            50 Performed at [facility], [address] [place], [place] [address]  Admission on 03/29/2023, Discharged on 03/30/2023  Component Date Value Ref Range Status   Lipase 03/29/2023 48  11 - 51 U/L Final   Performed at [facility], [address], [place], [place] [address]   Sodium 03/29/2023 140  135 - 145 mmol/L Final   Potassium 03/29/2023 3.1 (L)  3.5 - 5.1 mmol/L Final   Chloride 03/29/2023 106  98 - 111 mmol/L Final   CO2 03/29/2023 24  22 - 32 mmol/L Final   Glucose, Bld 03/29/2023 110 (H)  70 - 99 mg/dL Final   Glucose reference range applies only to samples taken after fasting for at least 8 hours.   BUN 03/29/2023 14  6 - 20 mg/dL Final   Creatinine, Ser 03/29/2023 0.82  0.61 - 1.24 mg/dL Final   Calcium  03/29/2023 8.6 (L)  8.9 - 10.3 mg/dL Final   Total Protein 97/94/7974 7.1  6.5 - 8.1 g/dL Final   Albumin  03/29/2023 4.0  3.5 - 5.0 g/dL Final   AST 97/94/7974 16  15 - 41 U/L Final   ALT 03/29/2023 16  0 - 44 U/L Final   Alkaline Phosphatase 03/29/2023 56  38 - 126 U/L Final   Total Bilirubin 03/29/2023 0.4  0.0 - 1.2 mg/dL Final   GFR, Estimated 03/29/2023 >60  >60 mL/min Final   Comment: (NOTE) Calculated using the CKD-EPI Creatinine Equation (2021)    Anion gap 03/29/2023 10  5 - 15 Final   Performed at [facility], [address], [place], [place] [address]   WBC 03/29/2023 6.9  4.0 - 10.5 K/uL Final   RBC 03/29/2023 4.89  4.22 - 5.81 MIL/uL Final   Hemoglobin 03/29/2023 14.9  13.0 - 17.0 g/dL Final   HCT 97/94/7974 43.7  39.0 - 52.0 % Final   MCV 03/29/2023 89.4  80.0 - 100.0 fL Final   MCH 03/29/2023 30.5  26.0 - 34.0 pg Final   MCHC 03/29/2023 34.1  30.0 -  36.0 g/dL Final   RDW 97/94/7974 13.9  11.5 - 15.5 % Final   Platelets 03/29/2023 320  150 - 400 K/uL Final   nRBC 03/29/2023 0.0  0.0 - 0.2 % Final   Performed at [facility], [address] [place], [place] [address]   Color, Urine 03/30/2023 YELLOW  YELLOW Final   APPearance 03/30/2023 CLEAR  CLEAR Final   Specific Gravity, Urine 03/30/2023 1.025  1.005 - 1.030 Final   pH 03/30/2023 5.0  5.0 - 8.0 Final   Glucose, UA 03/30/2023 NEGATIVE  NEGATIVE mg/dL Final   Hgb urine dipstick 03/30/2023 NEGATIVE  NEGATIVE Final   Bilirubin Urine 03/30/2023 NEGATIVE  NEGATIVE Final   Ketones, ur 03/30/2023 NEGATIVE  NEGATIVE mg/dL Final   Protein, ur 97/93/7974 NEGATIVE  NEGATIVE mg/dL Final   Nitrite 97/93/7974 NEGATIVE  NEGATIVE Final   Leukocytes,Ua 03/30/2023 NEGATIVE  NEGATIVE Final   Performed at [facility], [address] [place], [place] [address]  Office Visit on 03/17/2023  Component Date Value Ref Range Status   Glucose 03/17/2023 84  70 - 99 mg/dL Final   BUN 98/75/7974 19  6 - 24 mg/dL Final   Creatinine, Ser 03/17/2023 0.92  0.76 - 1.27 mg/dL Final   eGFR 98/75/7974 107  >59 mL/min/1.73 Final   BUN/Creatinine Ratio 03/17/2023 21 (H)  9 - 20 Final   Sodium 03/17/2023 142  134 - 144 mmol/L Final   Potassium 03/17/2023 4.3  3.5 - 5.2 mmol/L Final   Chloride 03/17/2023 100  96 -  106 mmol/L Final   CO2 03/17/2023 26  20 - 29 mmol/L Final   Calcium  03/17/2023 9.5  8.7 - 10.2 mg/dL Final   Total Protein 98/75/7974 7.6  6.0 - 8.5 g/dL Final   Albumin  03/17/2023 4.8  4.1 - 5.1 g/dL Final   Globulin, Total 03/17/2023 2.8  1.5 - 4.5 g/dL Final   Bilirubin Total 03/17/2023 <0.2  0.0 - 1.2 mg/dL Final   Alkaline Phosphatase 03/17/2023 85  44 - 121 IU/L Final   AST 03/17/2023 18  0 - 40 IU/L Final   ALT 03/17/2023 32  0 - 44 IU/L Final   Hgb A1c MFr Bld 03/17/2023 5.4  4.8 - 5.6 % Final   Comment:          Prediabetes: 5.7 - 6.4          Diabetes: >6.4          Glycemic control for  adults with diabetes: <7.0    Est. average glucose Bld gHb Est-m* 03/17/2023 108  mg/dL Final   TSH 98/75/7974 1.690  0.450 - 4.500 uIU/mL Final   Vit D, 25-Hydroxy 03/17/2023 56.9  30.0 - 100.0 ng/mL Final   Comment: Vitamin D  deficiency has been defined by the Institute of Medicine and an Endocrine Society practice guideline as a level of serum 25-OH vitamin D  less than 20 ng/mL (1,2). The Endocrine Society went on to further define vitamin D  insufficiency as a level between 21 and 29 ng/mL (2). 1. IOM (Institute of Medicine). 2010. Dietary reference    intakes for calcium  and D. [place]: The    [employer]. 2. Holick MF, Binkley [place], Bischoff-Ferrari HA, et al.    Evaluation, treatment, and prevention of vitamin D     deficiency: an Endocrine Society clinical practice    guideline. JCEM. 2011 Jul; 96(7):1911-30.    Cholesterol, Total 03/17/2023 145  100 - 199 mg/dL Final   Triglycerides 98/75/7974 130  0 - 149 mg/dL Final   HDL 98/75/7974 36 (L)  >39 mg/dL Final   VLDL Cholesterol Cal 03/17/2023 23  5 - 40 mg/dL Final   LDL Chol Calc (NIH) 03/17/2023 86  0 - 99 mg/dL Final   Chol/HDL Ratio 03/17/2023 4.0  0.0 - 5.0 ratio Final   Comment:                                   T. Chol/HDL Ratio                                             Men  Women                               1/2 Avg.Risk  3.4    3.3                                   Avg.Risk  5.0    4.4                                2X Avg.Risk  9.6    7.1  3X Avg.Risk 23.4   11.0    Creatinine, Urine 03/17/2023 85.6  Not Estab. mg/dL Final   Microalbumin, Urine 03/17/2023 4.1  Not Estab. ug/mL Final   Microalb/Creat Ratio 03/17/2023 5  0 - 29 mg/g creat Final   Comment:                        Normal:                0 -  29                        Moderately increased: 30 - 300                        Severely increased:       >300     Allergies: Patient has no known  allergies.  Medications:  PTA Medications  Medication Sig   PULMICORT  0.25 MG/2ML nebulizer solution Take 2 mLs (0.25 mg total) by nebulization in the morning and at bedtime. (Patient not taking: Reported on 09/13/2023)   albuterol  (VENTOLIN  HFA) 108 (90 Base) MCG/ACT inhaler Inhale 1-2 puffs into the lungs every 6 (six) hours as needed for wheezing or shortness of breath.   WEGOVY  0.5 MG/0.5ML SOAJ Inject 0.5 mg into the skin once a week. (Patient taking differently: Inject 0.5 mg into the skin every Wednesday.)   atorvastatin  (LIPITOR) 40 MG tablet Take 1 tablet by mouth once daily   tadalafil  (CIALIS ) 20 MG tablet TAKE 1/2 TO 1 (ONE-HALF TO ONE) TABLET BY MOUTH EVERY OTHER DAY AS NEEDED FOR ERECTILE DYSFUNCTION (Patient taking differently: Take 10-20 mg by mouth as directed. Take every other day as needed for erectile dysfunction)   sertraline  (ZOLOFT ) 100 MG tablet Take 2 tablets (200 mg total) by mouth daily.   torsemide  (DEMADEX ) 20 MG tablet Take 1 tablet (20 mg total) by mouth 2 (two) times daily.   potassium chloride  SA (KLOR-CON  M) 20 MEQ tablet Take 1 tablet (20 mEq total) by mouth 2 (two) times daily.      Medical Decision Making  Observation unit    Recommendations  Based on my evaluation the patient does not appear to have an emergency medical condition.  Gaither Pouch, NP 09/13/23  8:35 PM

## 2023-09-13 NOTE — ED Notes (Signed)
Safe transport called 

## 2023-09-13 NOTE — ED Provider Notes (Signed)
 [facility] Continuous Assessment Admission H&P  Date: 09/13/23 Patient Name: Shawn Ramsey MRN: [id number] Chief Complaint: wanting 30 day detox from cocaine  Diagnoses:  Final diagnoses:  Cocaine abuse (HCC)  Recurrent major depressive disorder, remission status unspecified (HCC)  GAD (generalized anxiety disorder)    HPI: Shawn Ramsey, 43 y/o male with a history of cocaine abuse,  alcohol abuse,  MDD,  presented to [facility] seeking long-term detox program.  According to the patient he relapsed on crack cocaine when asked when the last time he used cocaine patient stated right before coming into the night.  Patient also reports that he use alcohol a couple days ago, which he stated was Saturday of last week.  Patient reports he currently lives with his mother.  Currently unemployed stating he is on disability.  Stated that he also has increased depression.  Patient also stated that sometimes he will hear voices when asked if he hears the voices when he used crack cocaine patient stated yes.  Patient was advised to stay away from crack because that could be contributing to his hallucination.   Face-to-face observation of patient, patient is alert and oriented x 4, speech is clear, maintain eye contact.  Patient denies SI, HI, or paranoia.  Reports hearing sporadic voices especially when he used crack cocaine.  Patient reported he last drink alcohol last Saturday.  Patient denies any other illicit drug use.  Patient denied wanting to hurt himself or others.  At this present moment patient does not seem to be influenced by internal stimuli.  Given patient past history and current presentation.  Discussed with patient the need for admission.  With further evaluation.   Recommend observation unit  Total Time spent with patient: 20 minutes  Musculoskeletal  Strength & Muscle Tone: within normal limits Gait & Station: normal Patient leans: N/A  Psychiatric Specialty Exam   Presentation General Appearance:  Casual  Eye Contact: Good  Speech: Clear and Coherent  Speech Volume: Normal  Handedness: Right   Mood and Affect  Mood: Euthymic  Affect: Congruent   Thought Process  Thought Processes: Coherent  Descriptions of Associations:Intact  Orientation:Full (Time, Place and Person)  Thought Content:WDL  Diagnosis of Schizophrenia or Schizoaffective disorder in past: No   Hallucinations:Hallucinations: None  Ideas of Reference:None  Suicidal Thoughts:Suicidal Thoughts: No  Homicidal Thoughts:Homicidal Thoughts: No   Sensorium  Memory: Immediate Fair  Judgment: Intact  Insight: None   Executive Functions  Concentration: Good  Attention Span: Good  Recall: Good  Fund of Knowledge: Fair  Language: Fair   Psychomotor Activity  Psychomotor Activity: Psychomotor Activity: Normal   Assets  Assets: Desire for Improvement; Resilience   Sleep  Sleep: Sleep: Fair Number of Hours of Sleep: 6   Nutritional Assessment (For OBS and FBC admissions only) Has the patient had a weight loss or gain of 10 pounds or more in the last 3 months?: No Has the patient had a decrease in food intake/or appetite?: No Does the patient have dental problems?: No Does the patient have eating habits or behaviors that may be indicators of an eating disorder including binging or inducing vomiting?: No Has the patient recently lost weight without trying?: 0 Has the patient been eating poorly because of a decreased appetite?: 0 Malnutrition Screening Tool Score: 0    Physical Exam HENT:     Head: Normocephalic.     Nose: Nose normal.  Eyes:     Pupils: Pupils are equal, round, and reactive  to light.  Cardiovascular:     Rate and Rhythm: Normal rate.  Pulmonary:     Effort: Pulmonary effort is normal.  Musculoskeletal:        General: Normal range of motion.     Cervical back: Normal range of motion.  Neurological:      General: No focal deficit present.     Mental Status: He is alert.  Psychiatric:        Mood and Affect: Mood normal.        Behavior: Behavior normal.        Thought Content: Thought content normal.        Judgment: Judgment normal.    Review of Systems  Constitutional: Negative.   HENT: Negative.    Eyes: Negative.   Respiratory: Negative.    Cardiovascular: Negative.   Gastrointestinal: Negative.   Genitourinary: Negative.   Musculoskeletal: Negative.   Skin: Negative.   Neurological: Negative.   Psychiatric/Behavioral:  Positive for depression and substance abuse. The patient is nervous/anxious.     Blood pressure (!) 145/76, pulse 88, temperature 98.3 F (36.8 C), temperature source Oral, resp. rate 16, SpO2 95%. There is no height or weight on file to calculate BMI.  Past Psychiatric History: Substance abuse, MDD, GAD, alcohol abuse  Is the patient at risk to self? No  Has the patient been a risk to self in the past 6 months? No .    Has the patient been a risk to self within the distant past? No   Is the patient a risk to others? No   Has the patient been a risk to others in the past 6 months? No   Has the patient been a risk to others within the distant past? No   Past Medical History: See chart  Family History: Unknown  Social History: Cocaine, alcohol  Last Labs:  Admission on 07/31/2023, Discharged on 07/31/2023  Component Date Value Ref Range Status   Sodium 07/31/2023 142  135 - 145 mmol/L Final   Potassium 07/31/2023 3.9  3.5 - 5.1 mmol/L Final   Chloride 07/31/2023 111  98 - 111 mmol/L Final   CO2 07/31/2023 23  22 - 32 mmol/L Final   Glucose, Bld 07/31/2023 85  70 - 99 mg/dL Final   Glucose reference range applies only to samples taken after fasting for at least 8 hours.   BUN 07/31/2023 16  6 - 20 mg/dL Final   Creatinine, Ser 07/31/2023 0.76  0.61 - 1.24 mg/dL Final   Calcium  07/31/2023 8.7 (L)  8.9 - 10.3 mg/dL Final   GFR, Estimated  07/31/2023 >60  >60 mL/min Final   Comment: (NOTE) Calculated using the CKD-EPI Creatinine Equation (2021)    Anion gap 07/31/2023 8  5 - 15 Final   Performed at [facility], [address], [place], [place] [address]   WBC 07/31/2023 10.4  4.0 - 10.5 K/uL Final   RBC 07/31/2023 4.30  4.22 - 5.81 MIL/uL Final   Hemoglobin 07/31/2023 12.9 (L)  13.0 - 17.0 g/dL Final   HCT 93/90/7974 38.5 (L)  39.0 - 52.0 % Final   MCV 07/31/2023 89.5  80.0 - 100.0 fL Final   MCH 07/31/2023 30.0  26.0 - 34.0 pg Final   MCHC 07/31/2023 33.5  30.0 - 36.0 g/dL Final   RDW 93/90/7974 14.2  11.5 - 15.5 % Final   Platelets 07/31/2023 262  150 - 400 K/uL Final   nRBC 07/31/2023 0.0  0.0 - 0.2 %  Final   Performed at [facility], [address] [place], [place] [address]   Group A Strep by PCR 07/31/2023 DETECTED (A)  NOT DETECTED Final   Performed at [facility], [address], [place], [place] [address]  Admission on 06/30/2023, Discharged on 07/01/2023  Component Date Value Ref Range Status   Lipase 06/30/2023 49  11 - 51 U/L Final   Performed at [facility], [address] [place], [place] [address]   Sodium 06/30/2023 142  135 - 145 mmol/L Final   Potassium 06/30/2023 4.2  3.5 - 5.1 mmol/L Final   Chloride 06/30/2023 110  98 - 111 mmol/L Final   CO2 06/30/2023 24  22 - 32 mmol/L Final   Glucose, Bld 06/30/2023 94  70 - 99 mg/dL Final   Glucose reference range applies only to samples taken after fasting for at least 8 hours.   BUN 06/30/2023 19  6 - 20 mg/dL Final   Creatinine, Ser 06/30/2023 0.78  0.61 - 1.24 mg/dL Final   Calcium  06/30/2023 9.1  8.9 - 10.3 mg/dL Final   Total Protein 94/90/7974 7.2  6.5 - 8.1 g/dL Final   Albumin  06/30/2023 3.5  3.5 - 5.0 g/dL Final   AST 94/90/7974 29  15 - 41 U/L Final   ALT 06/30/2023 47 (H)  0 - 44 U/L Final   Alkaline Phosphatase 06/30/2023 57  38 - 126 U/L Final   Total Bilirubin 06/30/2023 0.3  0.0 - 1.2 mg/dL Final   GFR, Estimated 06/30/2023 >60   >60 mL/min Final   Comment: (NOTE) Calculated using the CKD-EPI Creatinine Equation (2021)    Anion gap 06/30/2023 8  5 - 15 Final   Performed at [facility], [address], [place], [place] [address]   WBC 06/30/2023 4.9  4.0 - 10.5 K/uL Final   RBC 06/30/2023 4.60  4.22 - 5.81 MIL/uL Final   Hemoglobin 06/30/2023 14.2  13.0 - 17.0 g/dL Final   HCT 94/90/7974 41.9  39.0 - 52.0 % Final   MCV 06/30/2023 91.1  80.0 - 100.0 fL Final   MCH 06/30/2023 30.9  26.0 - 34.0 pg Final   MCHC 06/30/2023 33.9  30.0 - 36.0 g/dL Final   RDW 94/90/7974 14.4  11.5 - 15.5 % Final   Platelets 06/30/2023 284  150 - 400 K/uL Final   nRBC 06/30/2023 0.0  0.0 - 0.2 % Final   Performed at [facility], [address], [place], [place] [address]   Color, Urine 06/30/2023 YELLOW  YELLOW Final   APPearance 06/30/2023 CLEAR  CLEAR Final   Specific Gravity, Urine 06/30/2023 1.026  1.005 - 1.030 Final   pH 06/30/2023 7.0  5.0 - 8.0 Final   Glucose, UA 06/30/2023 NEGATIVE  NEGATIVE mg/dL Final   Hgb urine dipstick 06/30/2023 NEGATIVE  NEGATIVE Final   Bilirubin Urine 06/30/2023 NEGATIVE  NEGATIVE Final   Ketones, ur 06/30/2023 NEGATIVE  NEGATIVE mg/dL Final   Protein, ur 94/90/7974 NEGATIVE  NEGATIVE mg/dL Final   Nitrite 94/90/7974 NEGATIVE  NEGATIVE Final   Leukocytes,Ua 06/30/2023 NEGATIVE  NEGATIVE Final   Performed at [facility], [address] [place], [place] [address]   Alcohol, Ethyl (B) 06/30/2023 <15  <15 mg/dL Final   Comment: Please note change in reference range. (NOTE) For medical purposes only. Performed at [facility], [address] [place], [place] [address]    Opiates 06/30/2023 NONE DETECTED  NONE DETECTED Final  Cocaine 06/30/2023 POSITIVE (A)  NONE DETECTED Final   Benzodiazepines 06/30/2023 NONE DETECTED  NONE DETECTED Final   Amphetamines 06/30/2023 NONE DETECTED  NONE DETECTED Final   Tetrahydrocannabinol 06/30/2023 NONE DETECTED  NONE DETECTED Final    Barbiturates 06/30/2023 NONE DETECTED  NONE DETECTED Final   Comment: (NOTE) DRUG SCREEN FOR MEDICAL PURPOSES ONLY.  IF CONFIRMATION IS NEEDED FOR ANY PURPOSE, NOTIFY LAB WITHIN 5 DAYS.  LOWEST DETECTABLE LIMITS FOR URINE DRUG SCREEN Drug Class                     Cutoff (ng/mL) Amphetamine and metabolites    1000 Barbiturate and metabolites    200 Benzodiazepine                 200 Opiates and metabolites        300 Cocaine and metabolites        300 THC                            50 Performed at [facility], [address], [place], [place] [address]   Admission on 04/27/2023, Discharged on 04/27/2023  Component Date Value Ref Range Status   Sodium 04/27/2023 140  135 - 145 mmol/L Final   Potassium 04/27/2023 3.7  3.5 - 5.1 mmol/L Final   Chloride 04/27/2023 109  98 - 111 mmol/L Final   CO2 04/27/2023 23  22 - 32 mmol/L Final   Glucose, Bld 04/27/2023 105 (H)  70 - 99 mg/dL Final   Glucose reference range applies only to samples taken after fasting for at least 8 hours.   BUN 04/27/2023 15  6 - 20 mg/dL Final   Creatinine, Ser 04/27/2023 0.78  0.61 - 1.24 mg/dL Final   Calcium  04/27/2023 8.4 (L)  8.9 - 10.3 mg/dL Final   Total Protein 96/93/7974 6.9  6.5 - 8.1 g/dL Final   Albumin  04/27/2023 3.8  3.5 - 5.0 g/dL Final   AST 96/93/7974 17  15 - 41 U/L Final   ALT 04/27/2023 13  0 - 44 U/L Final   Alkaline Phosphatase 04/27/2023 58  38 - 126 U/L Final   Total Bilirubin 04/27/2023 0.5  0.0 - 1.2 mg/dL Final   GFR, Estimated 04/27/2023 >60  >60 mL/min Final   Comment: (NOTE) Calculated using the CKD-EPI Creatinine Equation (2021)    Anion gap 04/27/2023 8  5 - 15 Final   Performed at [facility], [address] [place], [place] [address]   Alcohol, Ethyl (B) 04/27/2023 <10  <10 mg/dL Final   Comment: (NOTE) Lowest detectable limit for serum alcohol is 10 mg/dL.  For medical purposes only. Performed at [facility], [address], [place], [place] [address]     Opiates 04/27/2023 NONE DETECTED  NONE DETECTED Final   Cocaine 04/27/2023 POSITIVE (A)  NONE DETECTED Final   Benzodiazepines 04/27/2023 NONE DETECTED  NONE DETECTED Final   Amphetamines 04/27/2023 NONE DETECTED  NONE DETECTED Final   Tetrahydrocannabinol 04/27/2023 NONE DETECTED  NONE DETECTED Final   Barbiturates 04/27/2023 NONE DETECTED  NONE DETECTED Final   Comment: (NOTE) DRUG SCREEN FOR MEDICAL PURPOSES ONLY.  IF CONFIRMATION IS NEEDED FOR ANY PURPOSE, NOTIFY LAB WITHIN 5 DAYS.  LOWEST DETECTABLE LIMITS FOR URINE DRUG SCREEN Drug Class                     Cutoff (ng/mL) Amphetamine and metabolites  1000 Barbiturate and metabolites    200 Benzodiazepine                 200 Opiates and metabolites        300 Cocaine and metabolites        300 THC                            50 Performed at [facility], [address], [place], [place] [address]    WBC 04/27/2023 5.6  4.0 - 10.5 K/uL Final   RBC 04/27/2023 4.49  4.22 - 5.81 MIL/uL Final   Hemoglobin 04/27/2023 13.9  13.0 - 17.0 g/dL Final   HCT 96/93/7974 41.0  39.0 - 52.0 % Final   MCV 04/27/2023 91.3  80.0 - 100.0 fL Final   MCH 04/27/2023 31.0  26.0 - 34.0 pg Final   MCHC 04/27/2023 33.9  30.0 - 36.0 g/dL Final   RDW 96/93/7974 15.5  11.5 - 15.5 % Final   Platelets 04/27/2023 305  150 - 400 K/uL Final   nRBC 04/27/2023 0.0  0.0 - 0.2 % Final   Neutrophils Relative % 04/27/2023 55  % Final   Neutro Abs 04/27/2023 3.1  1.7 - 7.7 K/uL Final   Lymphocytes Relative 04/27/2023 35  % Final   Lymphs Abs 04/27/2023 2.0  0.7 - 4.0 K/uL Final   Monocytes Relative 04/27/2023 7  % Final   Monocytes Absolute 04/27/2023 0.4  0.1 - 1.0 K/uL Final   Eosinophils Relative 04/27/2023 2  % Final   Eosinophils Absolute 04/27/2023 0.1  0.0 - 0.5 K/uL Final   Basophils Relative 04/27/2023 1  % Final   Basophils Absolute 04/27/2023 0.0  0.0 - 0.1 K/uL Final   Immature Granulocytes 04/27/2023 0  % Final   Abs Immature Granulocytes  04/27/2023 0.02  0.00 - 0.07 K/uL Final   Performed at [facility], [address], [place], [place] [address]   Color, Urine 04/27/2023 YELLOW  YELLOW Final   APPearance 04/27/2023 CLEAR  CLEAR Final   Specific Gravity, Urine 04/27/2023 1.026  1.005 - 1.030 Final   pH 04/27/2023 6.0  5.0 - 8.0 Final   Glucose, UA 04/27/2023 NEGATIVE  NEGATIVE mg/dL Final   Hgb urine dipstick 04/27/2023 NEGATIVE  NEGATIVE Final   Bilirubin Urine 04/27/2023 NEGATIVE  NEGATIVE Final   Ketones, ur 04/27/2023 NEGATIVE  NEGATIVE mg/dL Final   Protein, ur 96/93/7974 NEGATIVE  NEGATIVE mg/dL Final   Nitrite 96/93/7974 NEGATIVE  NEGATIVE Final   Leukocytes,Ua 04/27/2023 NEGATIVE  NEGATIVE Final   Performed at [facility], [address], [place], [place] [address]  Admission on 04/07/2023, Discharged on 04/07/2023  Component Date Value Ref Range Status   Sodium 04/07/2023 140  135 - 145 mmol/L Final   Potassium 04/07/2023 3.9  3.5 - 5.1 mmol/L Final   Chloride 04/07/2023 107  98 - 111 mmol/L Final   CO2 04/07/2023 23  22 - 32 mmol/L Final   Glucose, Bld 04/07/2023 92  70 - 99 mg/dL Final   Glucose reference range applies only to samples taken after fasting for at least 8 hours.   BUN 04/07/2023 11  6 - 20 mg/dL Final   Creatinine, Ser 04/07/2023 0.67  0.61 - 1.24 mg/dL Final   Calcium  04/07/2023 8.7 (L)  8.9 - 10.3 mg/dL Final   Total Protein 97/85/7974 6.5  6.5 - 8.1 g/dL Final   Albumin  04/07/2023 3.5  3.5 -  5.0 g/dL Final   AST 97/85/7974 15  15 - 41 U/L Final   ALT 04/07/2023 15  0 - 44 U/L Final   Alkaline Phosphatase 04/07/2023 52  38 - 126 U/L Final   Total Bilirubin 04/07/2023 0.4  0.0 - 1.2 mg/dL Final   GFR, Estimated 04/07/2023 >60  >60 mL/min Final   Comment: (NOTE) Calculated using the CKD-EPI Creatinine Equation (2021)    Anion gap 04/07/2023 10  5 - 15 Final   Performed at [facility], [address], [place], [place] [address]   Alcohol, Ethyl (B) 04/07/2023 <10  <10 mg/dL Final    Comment: (NOTE) Lowest detectable limit for serum alcohol is 10 mg/dL.  For medical purposes only. Performed at [facility], [address], [place], [place] [address]    WBC 04/07/2023 6.1  4.0 - 10.5 K/uL Final   RBC 04/07/2023 4.21 (L)  4.22 - 5.81 MIL/uL Final   Hemoglobin 04/07/2023 12.8 (L)  13.0 - 17.0 g/dL Final   HCT 97/85/7974 38.6 (L)  39.0 - 52.0 % Final   MCV 04/07/2023 91.7  80.0 - 100.0 fL Final   MCH 04/07/2023 30.4  26.0 - 34.0 pg Final   MCHC 04/07/2023 33.2  30.0 - 36.0 g/dL Final   RDW 97/85/7974 14.9  11.5 - 15.5 % Final   Platelets 04/07/2023 306  150 - 400 K/uL Final   nRBC 04/07/2023 0.0  0.0 - 0.2 % Final   Performed at [facility], [address], [place], [place] [address]   Opiates 04/07/2023 NONE DETECTED  NONE DETECTED Final   Cocaine 04/07/2023 POSITIVE (A)  NONE DETECTED Final   Benzodiazepines 04/07/2023 NONE DETECTED  NONE DETECTED Final   Amphetamines 04/07/2023 NONE DETECTED  NONE DETECTED Final   Tetrahydrocannabinol 04/07/2023 NONE DETECTED  NONE DETECTED Final   Barbiturates 04/07/2023 NONE DETECTED  NONE DETECTED Final   Comment: (NOTE) DRUG SCREEN FOR MEDICAL PURPOSES ONLY.  IF CONFIRMATION IS NEEDED FOR ANY PURPOSE, NOTIFY LAB WITHIN 5 DAYS.  LOWEST DETECTABLE LIMITS FOR URINE DRUG SCREEN Drug Class                     Cutoff (ng/mL) Amphetamine and metabolites    1000 Barbiturate and metabolites    200 Benzodiazepine                 200 Opiates and metabolites        300 Cocaine and metabolites        300 THC                            50 Performed at [facility], [address] [place] [address] [place], [place] [address]   Admission on 03/29/2023, Discharged on 03/30/2023  Component Date Value Ref Range Status   Lipase 03/29/2023 48  11 - 51 U/L Final   Performed at [facility], [address] [place], [place] [address]   Sodium 03/29/2023 140  135 - 145 mmol/L Final   Potassium 03/29/2023 3.1 (L)  3.5 - 5.1 mmol/L Final    Chloride 03/29/2023 106  98 - 111 mmol/L Final   CO2 03/29/2023 24  22 - 32 mmol/L Final   Glucose, Bld 03/29/2023 110 (H)  70 - 99 mg/dL Final   Glucose reference range applies only to samples taken after fasting for at least 8 hours.   BUN 03/29/2023 14  6 - 20 mg/dL Final   Creatinine, Ser 03/29/2023 0.82  0.61 - 1.24 mg/dL Final   Calcium  03/29/2023 8.6 (L)  8.9 - 10.3 mg/dL Final   Total Protein 97/94/7974 7.1  6.5 - 8.1 g/dL Final   Albumin  03/29/2023 4.0  3.5 - 5.0 g/dL Final   AST 97/94/7974 16  15 - 41 U/L Final   ALT 03/29/2023 16  0 - 44 U/L Final   Alkaline Phosphatase 03/29/2023 56  38 - 126 U/L Final   Total Bilirubin 03/29/2023 0.4  0.0 - 1.2 mg/dL Final   GFR, Estimated 03/29/2023 >60  >60 mL/min Final   Comment: (NOTE) Calculated using the CKD-EPI Creatinine Equation (2021)    Anion gap 03/29/2023 10  5 - 15 Final   Performed at [facility], [address] [place], [place] [address]   WBC 03/29/2023 6.9  4.0 - 10.5 K/uL Final   RBC 03/29/2023 4.89  4.22 - 5.81 MIL/uL Final   Hemoglobin 03/29/2023 14.9  13.0 - 17.0 g/dL Final   HCT 97/94/7974 43.7  39.0 - 52.0 % Final   MCV 03/29/2023 89.4  80.0 - 100.0 fL Final   MCH 03/29/2023 30.5  26.0 - 34.0 pg Final   MCHC 03/29/2023 34.1  30.0 - 36.0 g/dL Final   RDW 97/94/7974 13.9  11.5 - 15.5 % Final   Platelets 03/29/2023 320  150 - 400 K/uL Final   nRBC 03/29/2023 0.0  0.0 - 0.2 % Final   Performed at [facility], [address], [place], [place] [address]   Color, Urine 03/30/2023 YELLOW  YELLOW Final   APPearance 03/30/2023 CLEAR  CLEAR Final   Specific Gravity, Urine 03/30/2023 1.025  1.005 - 1.030 Final   pH 03/30/2023 5.0  5.0 - 8.0 Final   Glucose, UA 03/30/2023 NEGATIVE  NEGATIVE mg/dL Final   Hgb urine dipstick 03/30/2023 NEGATIVE  NEGATIVE Final   Bilirubin Urine 03/30/2023 NEGATIVE  NEGATIVE Final   Ketones, ur 03/30/2023 NEGATIVE  NEGATIVE mg/dL Final   Protein, ur 97/93/7974 NEGATIVE  NEGATIVE mg/dL  Final   Nitrite 97/93/7974 NEGATIVE  NEGATIVE Final   Leukocytes,Ua 03/30/2023 NEGATIVE  NEGATIVE Final   Performed at [facility] [address] [place], [place] [address]  Office Visit on 03/17/2023  Component Date Value Ref Range Status   Glucose 03/17/2023 84  70 - 99 mg/dL Final   BUN 98/75/7974 19  6 - 24 mg/dL Final   Creatinine, Ser 03/17/2023 0.92  0.76 - 1.27 mg/dL Final   eGFR 98/75/7974 107  >59 mL/min/1.73 Final   BUN/Creatinine Ratio 03/17/2023 21 (H)  9 - 20 Final   Sodium 03/17/2023 142  134 - 144 mmol/L Final   Potassium 03/17/2023 4.3  3.5 - 5.2 mmol/L Final   Chloride 03/17/2023 100  96 - 106 mmol/L Final   CO2 03/17/2023 26  20 - 29 mmol/L Final   Calcium  03/17/2023 9.5  8.7 - 10.2 mg/dL Final   Total Protein 98/75/7974 7.6  6.0 - 8.5 g/dL Final   Albumin  03/17/2023 4.8  4.1 - 5.1 g/dL Final   Globulin, Total 03/17/2023 2.8  1.5 - 4.5 g/dL Final   Bilirubin Total 03/17/2023 <0.2  0.0 - 1.2 mg/dL Final   Alkaline Phosphatase 03/17/2023 85  44 - 121 IU/L Final   AST 03/17/2023 18  0 - 40 IU/L Final   ALT 03/17/2023 32  0 - 44 IU/L Final   Hgb A1c MFr Bld 03/17/2023 5.4  4.8 - 5.6 %  Final   Comment:          Prediabetes: 5.7 - 6.4          Diabetes: >6.4          Glycemic control for adults with diabetes: <7.0    Est. average glucose Bld gHb Est-m* 03/17/2023 108  mg/dL Final   TSH 98/75/7974 1.690  0.450 - 4.500 uIU/mL Final   Vit D, 25-Hydroxy 03/17/2023 56.9  30.0 - 100.0 ng/mL Final   Comment: Vitamin D  deficiency has been defined by the Institute of Medicine and an Endocrine Society practice guideline as a level of serum 25-OH vitamin D  less than 20 ng/mL (1,2). The Endocrine Society went on to further define vitamin D  insufficiency as a level between 21 and 29 ng/mL (2). 1. IOM (Institute of Medicine). 2010. Dietary reference    intakes for calcium  and D. [place]: The    [employer]. 2. Holick MF, Binkley Stockham, Bischoff-Ferrari HA, et  al.    Evaluation, treatment, and prevention of vitamin D     deficiency: an Endocrine Society clinical practice    guideline. JCEM. 2011 Jul; 96(7):1911-30.    Cholesterol, Total 03/17/2023 145  100 - 199 mg/dL Final   Triglycerides 98/75/7974 130  0 - 149 mg/dL Final   HDL 98/75/7974 36 (L)  >39 mg/dL Final   VLDL Cholesterol Cal 03/17/2023 23  5 - 40 mg/dL Final   LDL Chol Calc (NIH) 03/17/2023 86  0 - 99 mg/dL Final   Chol/HDL Ratio 03/17/2023 4.0  0.0 - 5.0 ratio Final   Comment:                                   T. Chol/HDL Ratio                                             Men  Women                               1/2 Avg.Risk  3.4    3.3                                   Avg.Risk  5.0    4.4                                2X Avg.Risk  9.6    7.1                                3X Avg.Risk 23.4   11.0    Creatinine, Urine 03/17/2023 85.6  Not Estab. mg/dL Final   Microalbumin, Urine 03/17/2023 4.1  Not Estab. ug/mL Final   Microalb/Creat Ratio 03/17/2023 5  0 - 29 mg/g creat Final   Comment:                        Normal:                0 -  29  Moderately increased: 30 - 300                        Severely increased:       >300     Allergies: Patient has no known allergies.  Medications:  PTA Medications  Medication Sig   melatonin 5 MG TABS Take 1 tablet (5 mg total) by mouth at bedtime. (Patient not taking: Reported on 04/28/2023)   albuterol  (PROVENTIL ) (2.5 MG/3ML) 0.083% nebulizer solution Take 3 mLs (2.5 mg total) by nebulization every 6 (six) hours as needed for wheezing or shortness of breath.   PULMICORT  0.25 MG/2ML nebulizer solution Take 2 mLs (0.25 mg total) by nebulization in the morning and at bedtime. (Patient not taking: Reported on 04/28/2023)   albuterol  (VENTOLIN  HFA) 108 (90 Base) MCG/ACT inhaler Inhale 1-2 puffs into the lungs every 6 (six) hours as needed for wheezing or shortness of breath.   Diclofenac  Sodium 3 % GEL Apply to left knee  three times daily, as needed, for pain   pantoprazole  (PROTONIX ) 40 MG tablet Take 1 tablet (40 mg total) by mouth daily.   sucralfate  (CARAFATE ) 1 GM/10ML suspension Take 10 mLs (1 g total) by mouth 4 (four) times daily -  with meals and at bedtime.   hydrocortisone  cream 1 % Apply to affected area 2 times daily (Patient not taking: Reported on 04/27/2023)   WEGOVY  0.25 MG/0.5ML SOAJ Inject 0.25 mg into the skin once a week.   WEGOVY  0.5 MG/0.5ML SOAJ Inject 0.5 mg into the skin once a week.   atorvastatin  (LIPITOR) 40 MG tablet Take 1 tablet by mouth once daily   tadalafil  (CIALIS ) 20 MG tablet TAKE 1/2 TO 1 (ONE-HALF TO ONE) TABLET BY MOUTH EVERY OTHER DAY AS NEEDED FOR ERECTILE DYSFUNCTION   sertraline  (ZOLOFT ) 100 MG tablet Take 2 tablets (200 mg total) by mouth daily.   torsemide  (DEMADEX ) 20 MG tablet Take 1 tablet (20 mg total) by mouth 2 (two) times daily.   potassium chloride  SA (KLOR-CON  M) 20 MEQ tablet Take 1 tablet (20 mEq total) by mouth 2 (two) times daily.   ondansetron  (ZOFRAN -ODT) 4 MG disintegrating tablet Take 1 tablet (4 mg total) by mouth every 8 (eight) hours as needed for nausea or vomiting.   penicillin  v potassium (VEETID) 500 MG tablet Take 1 tablet (500 mg total) by mouth 4 (four) times daily.   furosemide  (LASIX ) 20 MG tablet Take 1 tablet (20 mg total) by mouth daily for 14 days.      Medical Decision Making  Observation unit    Recommendations  Based on my evaluation the patient does not appear to have an emergency medical condition.  Gaither Pouch, NP 09/13/23  3:00 AM

## 2023-09-13 NOTE — ED Notes (Signed)
 Report given to [facility] nurse Leita.

## 2023-09-13 NOTE — Progress Notes (Addendum)
 Pt transferred to [facility] for medical clearance. All belongings returned. Pt was transported via safe transport.

## 2023-09-13 NOTE — ED Notes (Signed)
 Pt was calm, cooperative on arrival to the unit. Pt said he came in because I need to be away from the environment. I need to be away from some stuff. Pt said he has been using cocaine and want to stop. Pt denied SI/HI/VH, said he hears random voices at times. 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 During skin assessment, pt was noted to have some swelling on legs, non - pitting. Pt said Yeah. I have not taken my water  pills in a while. Pt's belongings were secured per hospital protocol and he was encouraged to make his needs known to staff.. Staff will continue to monitor pt for safety.

## 2023-09-13 NOTE — Progress Notes (Signed)
 Shawn Ramsey Rule arrived to the [facility] for observation. Patient is A&Ox4. Patient admitted for substance abuse. Patient denies any suicidal ideations or homicidal ideations. Pt contracts for safety. Patient denies any auditory hallucinations, visual hallucinations, or CAH. Patient oriented to the unit and provided food, beverage, and snack. Patient verbalized understanding. Patient currently resting in bed. No distress noted.

## 2023-09-14 NOTE — ED Notes (Signed)
 Patient alert & oriented x4. Denies intent to harm self or others when asked. Denies A/VH. Patient denies any physical complaints when asked. No acute distress noted. Support and encouragement provided. Patient observed in milieu. No inappropriate behaviors seen or reported. Routine safety checks conducted per facility protocol. Encouraged patient to notify staff if any thoughts of harm towards self or others arise. Patient verbalizes understanding and agreement.

## 2023-09-14 NOTE — Discharge Instructions (Signed)
 Mr Shawn Ramsey,  It was very nice getting to know you.  Please follow up with altered medical appointments and seek outpatient substance use treatment as soon as possible.  Have listed Medicaid options as below.  If there is any medical or mental health emergency, please call 911 or present your nearest emergency department.  All my best, Odis Cleveland, MD [facility] Psychiatry Residency, PGY 2  Follow-up recommendations:  Activity:  Normal, as tolerated Diet:  Per PCP recommendation  Patient is instructed prior to discharge to: Take all medications as prescribed by his mental healthcare provider. Report any adverse effects and/or reactions from the medicines to his outpatient provider promptly. Patient has been instructed & cautioned: To not engage in alcohol and or illegal drug use while on prescription medicines.  In the event of worsening symptoms, patient is instructed to call the crisis hotline at 988, 911 and or go to the nearest ED for appropriate evaluation and treatment of symptoms. To follow-up with his primary care provider for your other medical issues, concerns and or health care needs.   Medicaid  and Non-Insured   SUBSTANCE USE TREATMENT FACILITIES   [facility] [address], [place], [address] [phone] phone NOTE: [facility] is no longer offering IOP services.  Serves those who are low-income or have no insurance.  [facility] [address], [place], [place], [address] [phone] phone [phone] fax NOTE: Does have Substance Abuse-Intensive Outpatient Program [facility]) as well as transitional housing if eligible.  [facility] [address] [place], [place], [address] [phone] phone [phone] fax  [facility] [id number] [place], [place], [address] [phone] phone [phone] fax     Outpatient Services for Therapy and Medication Management for Medicaid  Based on what you have shared, a  list of resources for outpatient therapy and psychiatry is provided below to get you started back on treatment.  It is imperative that you follow through with treatment within 5-7 days from the day of discharge to prevent any further risk to your safety or mental well-being.  You are not limited to the list provided.  In case of an urgent crisis, you may contact the Mobile Crisis Unit with [facility] at [phone].          [facility] [address] SECOND FLOOR [place], [place] [address] [phone] OUTPATIENT Walk-in information: Please note, all walk-ins are first come & first serve, with limited number of availability.  Please note that to be eligible for services you must bring: ID or a piece of mail with your name [facility] address  Therapist for therapy:  Monday & Wednesdays: Please ARRIVE at 7:15 AM for registration Will START at 8:00 AM Every 1st & 2nd Friday of the month: Please ARRIVE at 10:15 AM for registration Will START at 1 PM - 5 PM  Psychiatrist for medication management: Monday - Friday:  Please ARRIVE at 7:15 AM for registration Will START at 8:00 AM  Regretfully, due to limited availability, please be aware that you may not been seen on the same day as walk-in. Please consider making an appoint or try again. Thank you for your patience and understanding.    [facility] [address] [place], [place], [address] [phone] phone  [facility] [address] [place], [place], [address] [phone] phone [phone] phone ([address] AmeriHealth, Anthem/Elevance, BCBS, Centivo, [place], ComPsych, [place], [place],  [address], Partners [address], The [place], [place], UHC, American Financial, [place], Out of Network)  [employer], [place] [address] [place], [place], [address] [phone] phone ([place],  Anthem/Elevance, Sanmina-SCI Options/Carelon, BCBS, [address], [place], [place], [place], [place], [employer], [place], [place], [place], [facility])  [employer] [phone] [address] [place], [place], [address] [phone] phone (Medicaid, ask about other insurance)  [facility] [address] [place], [place], [address] [phone] phone [phone] fax ([address] [place] , [place], [place], [place] Health Choice, UHC, General Electric, Self-Pay)  Sarah Lempka [address] [place], [place], [address] [phone] phone ([address], Anthem/Elevance, [address], [address], [place], CSX Corporation, [place], [place], [place], Har[employer], [place], [place], [place], [facility])  [facility] - 6-8 MONTH WAIT FOR THERAPY; SOONER FOR MEDICATION MANAGEMENT [address] [place], [place], [address] [phone] phone ([address] AmeriHealth [address] - [place], [address], [place], [place], Friday Health Plans, [address], BCBS Healthy [place], [place], [address], [place], [place], [place], [place], Tricare, UHC, Safeco Corporation, [place])  [facility] [address], [address] [place], [place], [address] [phone] phone  [facility] [address] [place], [place], [address] [phone] phone  [facility] [address], [address] [place], [place], [address] [phone] phone  [facility] - THERAPY ONLY [address]  [place], [place], [address] [phone] phone  [facility], [place] [address], [place], [address] [phone] phone  [facility] [address] [place], [place], [address] [phone] phone [phone] fax  [facility] [address] [place], [place], [address] [phone] phone [phone] fax  [facility] [phone] [address] [place], [place]  [address] [phone] phone  Janit Griffins [address] [place], [place], [address]  [phone] phone  [facility]  (Adults Only) [address] [place], [place], [address]  [phone] phone [phone] fax

## 2023-09-14 NOTE — ED Notes (Signed)
 Patient discharged home. AVS reviewed, patient belongings returned complete and intact. Staff escorted to lobby. Safety maintained.

## 2023-09-14 NOTE — ED Notes (Signed)
 Patient observed/assessed in bed/chair resting quietly appearing in no distress and verbalizing no complaints at this time. Will continue to monitor.

## 2023-09-14 NOTE — ED Provider Notes (Signed)
 FBC/OBS ASAP Discharge Summary  Date and Time: 09/14/2023 11:20 AM  Name: Shawn Ramsey  MRN:  [id number]   Discharge Diagnoses:  Final diagnoses:  Cocaine abuse (HCC)  Recurrent major depressive disorder, remission status unspecified (HCC)    Subjective: On interview, patient repeated desire to go home.  Has multiple medication appointments and needs to keep.  We will discharge home with mother.  Gave verbal permission to reach out to mother to confirm.  Slept well.  Needs to meet outpatient medical appointments.  Sleep study soon to hopefully remove trach.  We will follow with outpatient psychiatry and resources.  Denied suicidal and homicidal ideation.  Denied auditory and visual hallucinations.  On interview with mother [name] [phone]) with verbal permission from patient: He's all right to come home. Mother is his representative for SSI. Works for [employer]. Mother will take care of him. This was a sudden thing. He does have appointments to go to. [facility] lung specialist, needs an appointment. Want to take the trach out. No safety concerns or firearms at home. He drove here. Can discharge him.    Stay Summary: Patient presented early a.m. of 7/23 seeking voluntary inpatient substance use treatment for cocaine.  Endorsed nonspecific auditory hallucinations and increased depression.  Was amenable to [facility] admission, however there were no beds available.  Patient noted some difficulty breathing later in a.m., edema was noted to upper extremities.  Given patient's congestive heart failure diagnosis (last echo 2021) as well as elevated blood pressure (167/106) was transferred to [facility], ED for medical clearance.  Patient was medically cleared at that time and returned.  On a.m. 7/24 he requested discharge.  Patient did not meet IVC criteria at that time.  Denied suicidal or homicidal ideation.  Denied new auditory or visual hallucinations.  Contracted for safety.  Total Time spent with  patient: 30 minutes  Past Psychiatric History: Remote diagnosis of schizophrenia, however presentations thus far have largely been related to substance use including cocaine and alcohol use.  Also has previous diagnoses of MDD and GAD.  Takes Zoloft  PRN for depression.  Discharged with information pertaining to [facility] outpatient psychiatric services. Past Medical History: Patient required prolonged ICU admission 2023 for COPD/acute hypercapnic respiratory failure.  Needed a tracheostomy at that time.  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  Does have a previous history of cognitive developmental delay. Family History: Unknown Family Psychiatric History: Unknown Social History: Substance use as above  Current Medications:  Current Facility-Administered Medications  Medication Dose Route Frequency Provider Last Rate Last Admin   acetaminophen  (TYLENOL ) tablet 650 mg  650 mg Oral Q6H PRN Trudy Carwin, NP       alum & mag hydroxide-simeth (MAALOX/MYLANTA) 200-200-20 MG/5ML suspension 30 mL  30 mL Oral Q4H PRN Trudy Carwin, NP       magnesium  hydroxide (MILK OF MAGNESIA) suspension 30 mL  30 mL Oral Daily PRN Trudy Carwin, NP       OLANZapine  (ZYPREXA ) injection 10 mg  10 mg Intramuscular TID PRN Trudy Carwin, NP       OLANZapine  (ZYPREXA ) injection 5 mg  5 mg Intramuscular TID PRN Trudy Carwin, NP       OLANZapine  zydis (ZYPREXA ) disintegrating tablet 5 mg  5 mg Oral TID PRN Trudy Carwin, NP       Current Outpatient Medications  Medication Sig Dispense Refill   albuterol  (VENTOLIN  HFA) 108 (90 Base) MCG/ACT inhaler Inhale 1-2 puffs into the lungs every 6 (six)  hours as needed for wheezing or shortness of breath. 8 g 3   atorvastatin  (LIPITOR) 40 MG tablet Take 1 tablet by mouth once daily 90 tablet 0   Cholecalciferol (VITAMIN D3 PO) Take 1 tablet by mouth daily.     potassium chloride  SA (KLOR-CON  M) 20 MEQ tablet Take 1 tablet (20 mEq total) by mouth 2 (two) times daily. 60 tablet 3    PULMICORT  0.25 MG/2ML nebulizer solution Take 2 mLs (0.25 mg total) by nebulization in the morning and at bedtime. (Patient not taking: Reported on 09/13/2023) 60 mL 5   sertraline  (ZOLOFT ) 100 MG tablet Take 2 tablets (200 mg total) by mouth daily. 60 tablet 3   tadalafil  (CIALIS ) 20 MG tablet TAKE 1/2 TO 1 (ONE-HALF TO ONE) TABLET BY MOUTH EVERY OTHER DAY AS NEEDED FOR ERECTILE DYSFUNCTION (Patient taking differently: Take 10-20 mg by mouth as directed. Take every other day as needed for erectile dysfunction) 10 tablet 0   torsemide  (DEMADEX ) 20 MG tablet Take 1 tablet (20 mg total) by mouth 2 (two) times daily. 60 tablet 3   WEGOVY  0.5 MG/0.5ML SOAJ Inject 0.5 mg into the skin once a week. (Patient taking differently: Inject 0.5 mg into the skin every Wednesday.) 2 mL 1    PTA Medications:  PTA Medications  Medication Sig   PULMICORT  0.25 MG/2ML nebulizer solution Take 2 mLs (0.25 mg total) by nebulization in the morning and at bedtime. (Patient not taking: Reported on 09/13/2023)   albuterol  (VENTOLIN  HFA) 108 (90 Base) MCG/ACT inhaler Inhale 1-2 puffs into the lungs every 6 (six) hours as needed for wheezing or shortness of breath.   WEGOVY  0.5 MG/0.5ML SOAJ Inject 0.5 mg into the skin once a week. (Patient taking differently: Inject 0.5 mg into the skin every Wednesday.)   atorvastatin  (LIPITOR) 40 MG tablet Take 1 tablet by mouth once daily   tadalafil  (CIALIS ) 20 MG tablet TAKE 1/2 TO 1 (ONE-HALF TO ONE) TABLET BY MOUTH EVERY OTHER DAY AS NEEDED FOR ERECTILE DYSFUNCTION (Patient taking differently: Take 10-20 mg by mouth as directed. Take every other day as needed for erectile dysfunction)   sertraline  (ZOLOFT ) 100 MG tablet Take 2 tablets (200 mg total) by mouth daily.   torsemide  (DEMADEX ) 20 MG tablet Take 1 tablet (20 mg total) by mouth 2 (two) times daily.   potassium chloride  SA (KLOR-CON  M) 20 MEQ tablet Take 1 tablet (20 mEq total) by mouth 2 (two) times daily.   Facility Ordered  Medications  Medication   acetaminophen  (TYLENOL ) tablet 650 mg   alum & mag hydroxide-simeth (MAALOX/MYLANTA) 200-200-20 MG/5ML suspension 30 mL   magnesium  hydroxide (MILK OF MAGNESIA) suspension 30 mL   OLANZapine  zydis (ZYPREXA ) disintegrating tablet 5 mg   OLANZapine  (ZYPREXA ) injection 5 mg   OLANZapine  (ZYPREXA ) injection 10 mg       06/28/2023    4:09 PM 04/14/2023    2:12 PM 03/17/2023    3:24 PM  Depression screen PHQ 2/9  Decreased Interest 2 0 0  Down, Depressed, Hopeless 1 0 0  PHQ - 2 Score 3 0 0  Altered sleeping 3    Tired, decreased energy 1    Change in appetite 1    Feeling bad or failure about yourself  2    Trouble concentrating 1    Moving slowly or fidgety/restless 0    Suicidal thoughts 0    PHQ-9 Score 11    Difficult doing work/chores Somewhat difficult  Flowsheet Row ED from 09/13/2023 in [facility] Most recent reading at 09/13/2023 10:23 PM ED from 09/13/2023 in [facility] Department at [facility] Most recent reading at 09/13/2023 12:10 PM ED from 09/13/2023 in [facility] Most recent reading at 09/13/2023  4:02 AM  C-SSRS RISK CATEGORY No Risk No Risk No Risk    Musculoskeletal  Strength & Muscle Tone: within normal limits Gait & Station: normal Patient leans: N/A  Psychiatric Specialty Exam  Presentation  General Appearance:  Appropriate for Environment  Eye Contact: Good  Speech: Garbled (soft, 2/2 trach)  Speech Volume: Normal  Handedness: Right   Mood and Affect  Mood: Euthymic  Affect: Congruent   Thought Process  Thought Processes: Coherent  Descriptions of Associations:Intact  Orientation:Full (Time, Place and Person)  Thought Content:WDL  Diagnosis of Schizophrenia or Schizoaffective disorder in past: No    Hallucinations:Hallucinations: None Description of Auditory Hallucinations: voices saying different things. did not  elaborate. denies command  Ideas of Reference:None  Suicidal Thoughts:Suicidal Thoughts: No  Homicidal Thoughts:Homicidal Thoughts: No   Sensorium  Memory: Immediate Good; Recent Good; Remote Good  Judgment: Fair  Insight: Fair   Chartered certified accountant: Fair  Attention Span: Fair  Recall: Fair  Fund of Knowledge: Fair  Language: Fair   Psychomotor Activity  Psychomotor Activity: Psychomotor Activity: Normal   Assets  Assets: Desire for Improvement   Sleep  Sleep: Sleep: Fair  No Safety Checks orders active in given range  Nutritional Assessment (For OBS and FBC admissions only) Has the patient had a weight loss or gain of 10 pounds or more in the last 3 months?: No Has the patient had a decrease in food intake/or appetite?: No Does the patient have dental problems?: No Does the patient have eating habits or behaviors that may be indicators of an eating disorder including binging or inducing vomiting?: No Has the patient recently lost weight without trying?: 0 Has the patient been eating poorly because of a decreased appetite?: 0 Malnutrition Screening Tool Score: 0    Physical Exam  Physical Exam Vitals reviewed.  Constitutional:      General: He is not in acute distress.    Appearance: He is not diaphoretic.  Neck:     Comments: Trach in place Neurological:     Mental Status: He is alert.    Review of Systems  Constitutional:  Negative for chills and fever.  Gastrointestinal:  Negative for nausea.  All other systems reviewed and are negative.  Blood pressure (!) 150/99, pulse 60, temperature 98.4 F (36.9 C), temperature source Oral, resp. rate 18, SpO2 100%. There is no height or weight on file to calculate BMI.  Demographic Factors:  Male  Loss Factors: Decline in physical health  Historical Factors: NA  Risk Reduction Factors:   Living with another person, especially a relative, Positive social support, and  Positive therapeutic relationship  Continued Clinical Symptoms:  Alcohol/Substance Abuse/Dependencies Schizophrenia:   Depressive state More than one psychiatric diagnosis Unstable or Poor Therapeutic Relationship  Cognitive Features That Contribute To Risk:  None    Suicide Risk:  Minimal: No identifiable suicidal ideation.  Patients presenting with few risk factors; may be classified as minimal risk based on the severity of the depressive symptoms  Plan Of Care/Follow-up recommendations:  Follow-up recommendations:  Activity:  Normal, as tolerated Diet:  Per PCP recommendation  Patient is instructed prior to discharge to: Take all medications as prescribed by  his mental healthcare provider. Report any adverse effects and/or reactions from the medicines to his outpatient provider promptly. Patient has been instructed & cautioned: To not engage in alcohol and or illegal drug use while on prescription medicines.  In the event of worsening symptoms, patient is instructed to call the crisis hotline at 988, 911 and or go to the nearest ED for appropriate evaluation and treatment of symptoms. To follow-up with his primary care provider for your other medical issues, concerns and or health care needs.   Disposition: Home with mother  Laterria Lasota, MD 09/14/2023, 11:20 AM

## 2023-09-14 NOTE — Progress Notes (Signed)
 Complex Care Management Note  Care Guide Note 09/14/2023 Name: REFAEL FULOP MRN: [id number] DOB: 03-07-1980  Reyna GORMAN Moroney is a 43 y.o. year old male who sees Antonetta Rollene BRAVO, MD for primary care. I reached out to Reyna GORMAN Rule by phone today to offer complex care management services.  Mr. Batty was given information about Complex Care Management services today including:   The Complex Care Management services include support from the care team which includes your Nurse Care Manager, Clinical Social Worker, or Pharmacist.  The Complex Care Management team is here to help remove barriers to the health concerns and goals most important to you. Complex Care Management services are voluntary, and the patient may decline or stop services at any time by request to their care team member.   Complex Care Management Consent Status: Patient agreed to services and verbal consent obtained.   Follow up plan:  Telephone appointment with complex care management team member scheduled for:  09/18/2023  Encounter Outcome:  Patient Scheduled  Jeoffrey Buffalo , RMA     [place]  [facility], [facility] Guide  Direct Dial: [phone]  Website: [email or web address]

## 2023-09-14 NOTE — ED Notes (Signed)
 Patient resting in lounger with eyes closed, respirations even and unlabored. Patient in no apparent acute distress. Environment secured. Safety checks in place per facility protocol.

## 2023-09-18 ENCOUNTER — Other Ambulatory Visit: Payer: Self-pay | Admitting: Licensed Clinical Social Worker
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
## 2023-09-18 NOTE — Patient Instructions (Signed)
 Visit Information  Thank you for taking time to visit with me today. Please don't hesitate to contact me if I can be of assistance to you before our next scheduled appointment.  Our next appointment is by telephone on 09/27/23 at 11 am Please call the care guide team at [phone] if you need to cancel or reschedule your appointment.   Following is a copy of your care plan:   Goals Addressed             This Visit's Progress    LCSW VBCI Social Work Care Plan       Problems:   Disease Management support and education needs related to Anxiety with depression, Stress, and Substance Misuse: narcotics and alcohol-recent ED visit and behavioral health inpatient stay.   CSW Clinical Goal(s):   Over the next 90 days, patient will work with SW to reduce or manage symptoms of depression, anxiety, alcohol and substance abuse until connected with long term behavioral health providers. Over the next 30 days, patient will gain inpatient treatment for substance abuse. Over the next 90 days, patient will gain BH support to help his symptom relief. Over the next 120 days, demonstrate ongoing self health care management ability by decrease in hospitalizations, getting connected with long term providers, attending NA meetings, and engaging in local behavioral health supportive resources that have been provided to both patient/family.   Interventions:  Assessed patient's previous treatment, needs, coping skills, current treatment, support system and barriers to care.  Crisis resource education provided to [phone] and Mobile Crisis Unit. Patient was encouraged to consider walk in options at [facility] and [facility] as well in times of urgent matters.  Suicidal Ideation/Homicidal Ideation: No Provided basic mental health support, education and interventions. Family is interested in inpatient substance abuse treatment for patient. Informed both family and patient that we could contact [facility] in [facility] to start the enrollment process but patient declined. Resources were sent to both patient and patient's mother email for consideration and review. Mother and patient were both educated extensively on payment options. Patient understands that he must complete detox first before he can gain inpatient treatment. Patient can go to [facility], [facility] or [facility] to detox and then can go to Inpatient Treatment at his preferred facility, [facility] [employer] pay, [place], [place] [address] [place], [place] [address]     [phone]   Discussed several options for long term counseling based on need and insurance   [facility] LCSW educated patient on medication therapy that can help with alcohol triggers. Patient admits use to other substances. Crisis support resources reviewed as well.  Other interventions include: Motivational Interviewing  Solution-Focused Strategies  Mindfulness or Management consultant  Inter-disciplinary care team collaboration (see longitudinal plan of care)  VBCI LCSW educated patient on healthy coping skills to implement into is daily routine to combat cravings and depressive symptoms once they arise.   Patient Goals/Self-Care Activities:  Increase coping skills, healthy habits, self-management skills, and stress reduction  Implement interventions discussed today to decreases alcohol usage, symptoms of anxiety and depression and increase knowledge and/or ability of: coping skills, healthy habits, self-management skills, and stress reduction.  Plan:   The care management team will reach out to the patient again over the next 30 days.          Please call the Suicide and Crisis Lifeline: 988 call the [place]  National Suicide  Prevention Lifeline: 418-160-1508 or TTY: 3078630947 TTY 540-082-8242) to talk to a trained counselor call 1-800-273-TALK (toll free, 24 hour hotline) go to  [facility] [address], [place] [phone]) call the [facility] Crisis Line: [phone] call 911 if you are experiencing a Mental Health or Behavioral Health Crisis or need someone to talk to.  Patient verbalizes understanding of instructions and care plan provided today and agrees to view in MyChart. Active MyChart status and patient understanding of how to access instructions and care plan via MyChart confirmed with patient.     Lyle Rung, BSW, MSW, LCSW Licensed Clinical Social Worker [employer]   [email or web address] Direct Dial: [phone]

## 2023-09-27 ENCOUNTER — Ambulatory Visit (HOSPITAL_COMMUNITY)
Admission: RE | Admit: 2023-09-27 | Discharge: 2023-09-27 | Disposition: A | Discharge status: Home / Self Care | Source: Ambulatory Visit

## 2023-09-27 ENCOUNTER — Other Ambulatory Visit: Payer: Self-pay | Admitting: Licensed Clinical Social Worker

## 2023-09-27 DIAGNOSIS — J45909 Unspecified asthma, uncomplicated: Secondary | ICD-10-CM | POA: Insufficient documentation

## 2023-09-27 DIAGNOSIS — G4733 Obstructive sleep apnea (adult) (pediatric): Secondary | ICD-10-CM | POA: Diagnosis not present

## 2023-09-27 DIAGNOSIS — J45991 Cough variant asthma: Secondary | ICD-10-CM | POA: Diagnosis not present

## 2023-09-27 DIAGNOSIS — Z930 Tracheostomy status: Secondary | ICD-10-CM | POA: Insufficient documentation

## 2023-09-27 DIAGNOSIS — F17210 Nicotine dependence, cigarettes, uncomplicated: Secondary | ICD-10-CM | POA: Insufficient documentation

## 2023-09-27 NOTE — Progress Notes (Signed)
 Tracheostomy Procedure Note  Shawn Ramsey [id number] Aug 04, 1980  Pre Procedure Tracheostomy Information  Trach Brand: Shiley Size: 6.0 60XLTUP Style: Uncuffed Secured by: Velcro   Procedure: Trach Change and Trach Cleaning    Post Procedure Tracheostomy Information  Trach Brand: Shiley Size: 6.0 60XLTUP Style: Uncuffed Secured by: Velcro   Post Procedure Evaluation:  ETCO2 positive color change from yellow to purple : Yes.   Vital signs:VSS Patients current condition: stable Complications: No apparent complications Trach site exam: clean, dry Wound care done: 4 x 4 gauze  drain Patient did tolerate procedure well.   Education: none  Prescription needs: none    Additional needs: New PMV given to patient during this visit

## 2023-09-27 NOTE — Progress Notes (Signed)
 Reason for visit  Trach management/ possible decannulation   HPI Shawn Ramsey is well known to me. I follow him for trach management in the setting of MO w/ associated OHS and OSA. CPAP titration study on 12/3 thru [facility] study showing optimal positive pressure 15cmH2O on 12/3. He is still struggling w/ cpap compliance. Here today for planned change.   Review of Systems  All other systems reviewed and are negative.   Exam  General ambulatory no distress HENT NCAT 6 prox xlt trach stable Pulm cl Card rrr Abd soft Ext warm   Procedure Trach removed. Site inspected. Granulation tissue around stoma but otherwise unremarkable & unchanged  New trach placed over obturator w/out difficulty. Placement verified via ETCO2. Has fair amount of granulation tissue at stoma    Impression/plan Active Problems:   Tracheostomy status (HCC)   Morbid obesity due to excess calories (HCC)   OSA (obstructive sleep apnea)   Cough variant asthma   Cigarette nicotine  dependence    Tracheostomy status (HCC) Overview: Followed at [facility].  Size 6 cuffless proximal XLT shiley Last change today 8/6 (previously 4/28)   Discussion His trach is stable and he is clear for decannulation IMO once he can prove he will use his CPAP regularly. His compliance continues being the primary barrier to decannulation & I'm feeling less confident that he will meet this goal   Plan ROV 12 weeks to assess compliance w/ CPAP and readiness for decannulation (I am doubtful that he will be) Cont routine trach care Cont routine trach change             My time 16 min   Maude FORBES Banner ACNP-BC [facility] Pulmonary/Critical Care Pager # [phone] OR # [phone] if no answer
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
## 2023-10-03 ENCOUNTER — Other Ambulatory Visit: Payer: Self-pay | Admitting: Licensed Clinical Social Worker
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
## 2023-10-03 NOTE — Patient Instructions (Signed)
 Visit Information  Thank you for taking time to visit with me today. Please don't hesitate to contact me if I can be of assistance to you before our next scheduled appointment.  Our next appointment is by telephone on 10/17/23 at 10 am Please call the care guide team at [phone] if you need to cancel or reschedule your appointment.   Following is a copy of your care plan:   Goals Addressed             This Visit's Progress    LCSW VBCI Social Work Care Plan       Problems:   Disease Management support and education needs related to Anxiety with depression, Stress, and Substance Misuse: narcotics and alcohol-recent ED visit and behavioral health inpatient stay.   CSW Clinical Goal(s):   Over the next 90 days, patient will work with SW to reduce or manage symptoms of depression, anxiety, alcohol and substance abuse until connected with long term behavioral health providers. Over the next 30 days, patient will gain inpatient treatment for substance abuse. Over the next 90 days, patient will gain BH support to help his symptom relief. Over the next 120 days, demonstrate ongoing self health care management ability by decrease in hospitalizations, getting connected with long term providers, attending NA meetings, and engaging in local behavioral health supportive resources that have been provided to both patient/family.   Interventions:  Assessed patient's previous treatment, needs, coping skills, current treatment, support system and barriers to care.  Crisis resource education provided to [phone] and Mobile Crisis Unit. Patient was encouraged to consider walk in options such as [facility] and [facility] in times of urgent matters or to contact 988/Mobile Crisis  Suicidal Ideation/Homicidal Ideation: No Provided basic mental health support, education and interventions. Family is interested in inpatient substance abuse treatment for patient. Mother and patient were both educated extensively on  payment options. Patient understands that he must complete detox first (for most inpatient facilities) before he can gain inpatient treatment. Patient can go to [facility], [facility] or [facility] to detox and then can go to Inpatient Treatment at his preferred facility, [facility]. VBCI LCSW contacted [facility] and was informed that patient must call himself to do a screening call over the phone to initiate services. Patient and his mother was advised of this today and were offered the opportunity to call together but patient reports he will contact them himself today. 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 VBCI RNCM referral placed as she has specific experience with trach and it is her expertise. Email sent to patient with directions and resources specific to substance abuse support.  Discussed several options for long term counseling based on need and insurance LCSW educated patient on medication therapy that can help with alcohol triggers Patient admits use to other substances. Relapse prevention education provided. Crisis support resources reviewed as well. Other interventions include: Motivational Interviewing Solution-Focused Strategies Mindfulness or Management consultant  Inter-disciplinary care team collaboration (see longitudinal plan of care) VBCI LCSW educated patient on healthy coping skills to implement into is daily routine to combat cravings and depressive symptoms once they arise.   Patient Goals/Self-Care Activities:  Increase coping skills, healthy habits, self-management skills, and stress reduction  Implement interventions discussed today to decreases alcohol usage, symptoms of anxiety and  depression and increase knowledge and/or ability of: coping skills, healthy habits, self-management skills, and stress reduction.  Plan:    The care management team will reach out to the patient again over the next 30 days.          Please call the Suicide and Crisis Lifeline: 988 call the [place]  National Suicide Prevention Lifeline: (534) 426-3559 or TTY: (240) 052-9629 TTY 548-024-1966) to talk to a trained counselor go to [facility] [address], [place] [phone]) call the [facility] Crisis Line: [phone] if you are experiencing a Mental Health or Behavioral Health Crisis or need someone to talk to.  Patient verbalizes understanding of instructions and care plan provided today and agrees to view in MyChart. Active MyChart status and patient understanding of how to access instructions and care plan via MyChart confirmed with patient.     Lyle Rung, BSW, MSW, LCSW Licensed Clinical Social Worker [employer]   [email or web address] Direct Dial: [phone]

## 2023-10-13 ENCOUNTER — Other Ambulatory Visit: Payer: Self-pay | Admitting: *Deleted

## 2023-10-13 DIAGNOSIS — J9610 Chronic respiratory failure, unspecified whether with hypoxia or hypercapnia: Secondary | ICD-10-CM

## 2023-10-13 DIAGNOSIS — F1910 Other psychoactive substance abuse, uncomplicated: Secondary | ICD-10-CM

## 2023-10-13 DIAGNOSIS — F322 Major depressive disorder, single episode, severe without psychotic features: Secondary | ICD-10-CM
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
## 2023-10-13 NOTE — Patient Instructions (Signed)
 Visit Information  Thank you for taking time to visit with me today.   Tailored Plan Medicaid On August 22, 2022 some people on [place] Medicaid will move to a new kind of Medicaid health plan called a Tailored Plan. Tailored Plans cover your doctor visits, prescription drugs, and health care services.    If your [place] Medicaid will move to a Tailored Plan, you should have gotten a letter and welcome packet. If you're not sure, call your [place] Medicaid Enrollment Broker at [phone] and ask.  Check out these free materials, in [place] and [place], to learn more about your Tailored Plan: Medicaid.[email or web address].Gov/Tailored-Plans/Toolkit  Tailored Care Management Services  TCM services are available to you now. If you are a Tailored Plan member or will be and want information about Tailored Care Management Services including rides to appointments and community and home services, call the Care Management provider for your county of residence:    [facility] [place], [place])  Member Services: [phone] Behavioral Health Crisis Line: 4231751067, [place], [place], [place], [place])  Member Services: [phone] Behavioral Health Crisis Line: [phone]     Please call the Suicide and Crisis Lifeline: 988 call the [place]  National Suicide Prevention Lifeline: 616-110-0982 or TTY: 606-820-0230 TTY 3524233328) to talk to a trained counselor call 1-800-273-TALK (toll free, 24 hour hotline) call the [facility]: [phone] call 911 if you are experiencing a Mental Health or Behavioral Health Crisis or need someone to talk to.  Patient verbalizes understanding of instructions and care plan provided today and agrees to view in MyChart. Active MyChart status and patient understanding of how to access instructions and care plan via MyChart confirmed with patient.     Rosina Forte, BSN RN [facility], [facility]  Health RN Care Manager Direct Dial: [phone]  Fax: [phone]

## 2023-10-17 ENCOUNTER — Ambulatory Visit: Admitting: Family Medicine

## 2023-10-17 ENCOUNTER — Encounter: Payer: Self-pay | Admitting: Family Medicine

## 2023-10-17 VITALS — BP 128/70 | HR 93 | Resp 16 | Ht 65.0 in | Wt 286.0 lb

## 2023-10-17 DIAGNOSIS — E1169 Type 2 diabetes mellitus with other specified complication: Secondary | ICD-10-CM | POA: Diagnosis not present

## 2023-10-17 DIAGNOSIS — F1910 Other psychoactive substance abuse, uncomplicated: Secondary | ICD-10-CM | POA: Diagnosis not present

## 2023-10-17 DIAGNOSIS — G4733 Obstructive sleep apnea (adult) (pediatric): Secondary | ICD-10-CM

## 2023-10-17 DIAGNOSIS — Z23 Encounter for immunization: Secondary | ICD-10-CM

## 2023-10-17 DIAGNOSIS — E785 Hyperlipidemia, unspecified: Secondary | ICD-10-CM

## 2023-10-17 DIAGNOSIS — R7303 Prediabetes: Secondary | ICD-10-CM

## 2023-10-17 MED ORDER — WEGOVY 1 MG/0.5ML ~~LOC~~ SOAJ: 1.0000 mg | SUBCUTANEOUS | 3 refills | Status: AC

## 2023-10-17 NOTE — Progress Notes (Signed)
 Shawn Ramsey     MRN: [id number]      DOB: 19-Oct-1980  Chief Complaint  Patient presents with   Medical Management of Chronic Issues    3 month follow up     HPI Mr. Rathert is here for follow up and re-evaluation of chronic medical conditions, medication management and review of any available recent lab and radiology data.  Preventive health is updated, specifically  Cancer screening and Immunization.   Reports non compliance with cpap Unable to go to inpatient rehab he has been trying to get to due to trach, nurse checked this during the visit Ongoing nicotine  and cocaine use Reports uncontrolled depression and anxiety, needs to be trearted by psychiatry at high risk  Takes torsemide   inconsistently esp when going out due to excess urinariation ROS Denies recent fever or chills. Denies sinus pressure, nasal congestion, ear pain or sore throat. Denies chest congestion, productive cough or wheezing. Denies chest pains, palpitations and leg swelling Denies abdominal pain, nausea, vomiting,diarrhea or constipation.   Denies dysuria, frequency, hesitancy or incontinence. Denies joint pain, swelling and limitation in mobility. Denies headaches, seizures, numbness, or tingling. Uncontrolled depression and anxiety Denies skin break down or rash.   PE  BP 128/70   Pulse 93   Resp 16   Ht 5' 5 (1.651 m)   Wt 286 lb (129.7 kg)   SpO2 94%   BMI 47.59 kg/m   Patient alert and oriented and in no cardiopulmonary distress.  HEENT: No facial asymmetry, EOMI,     Neck supple .  Chest: Clear to auscultation bilaterally.Decreased air entry   CVS: S1, S2 no murmurs, no S3.Regular rate.  ABD: Soft non tender.   Ext:  one plus edema  MS: decreasded  ROM spine, shoulders, hips and knees.  Skin: Intact, no ulcerations or rash noted.  Psych: Good eye contact,  anxious and  depressed appearing.  CNS: CN 2-12 intact, power,  normal throughout.no focal deficits  noted.   Assessment & Plan  Substance abuse (HCC) Ongoing challenge needs to go  to out pt facility, will need to call back o n this as to where he will go, current facility he is asking about will not accept himbecause he has a trach and also identify a case worker/ Child psychotherapist who will work with him/ is working with him  Prediabetes Patient educated about the importance of limiting  Carbohydrate intake , the need to commit to daily physical activity for a minimum of 30 minutes , and to commit weight loss. The fact that changes in all these areas will reduce or eliminate all together the development of diabetes is stressed.      Latest Ref Rng & Units 10/17/2023    3:16 PM 09/13/2023    1:42 PM 09/13/2023    3:20 AM 07/31/2023    8:55 PM 06/30/2023   11:18 PM  Diabetic Labs  HbA1c 4.8 - 5.6 % 5.7       Chol 100 - 199 mg/dL 862       HDL >60 mg/dL 39       Calc LDL 0 - 99 mg/dL 83       Triglycerides 0 - 149 mg/dL 78       Creatinine 9.38 - 1.24 mg/dL  9.41  9.21  9.23  9.21       10/17/2023    3:01 PM 10/17/2023    2:38 PM 09/14/2023    9:00 AM 09/13/2023  6:54 PM 09/13/2023    6:31 PM 09/13/2023    5:30 PM 09/13/2023    4:18 PM  BP/Weight  Systolic BP 128 144   161 149 876  Diastolic BP 70 90   108 81 78  Wt. (Lbs)  286       BMI  47.59 kg/m2          Information is confidential and restricted. Go to Review Flowsheets to unlock data.      03/17/2023    3:20 PM 04/20/2021   10:20 AM  Foot/eye exam completion dates  Foot Form Completion Done Done      Morbid obesity due to excess calories [facility])  Patient re-educated about  the importance of commitment to a  minimum of 150 minutes of exercise per week as able.  The importance of healthy food choices with portion control discussed, as well as eating regularly and within a 12 hour window most days. The need to choose clean , green food 50 to 75% of the time is discussed, as well as to make water  the primary drink and set  a goal of 64 ounces water  daily.       10/17/2023    2:38 PM 07/31/2023    8:36 PM 06/30/2023   11:11 PM  Weight /BMI  Weight 286 lb 275 lb 9.2 oz 275 lb 2.2 oz  Height 5' 5 (1.651 m) 5' 5 (1.651 m) 5' 5 (1.651 m)  BMI 47.59 kg/m2 45.86 kg/m2 45.78 kg/m2    Inc wegovy  dose  OSA (obstructive sleep apnea) Reports inconsistent use of CPAP encouraged to be diligent so his health is protected  Immunization due After obtaining informed consent, the hPV and Hep B vaccines are administered , with no adverse effect noted at the time of administration.

## 2023-10-17 NOTE — Assessment & Plan Note (Addendum)
 Ongoing challenge needs to go  to out pt facility, will need to call back o n this as to where he will go, current facility he is asking about will not accept himbecause he has a trach and also identify a case worker/ Child psychotherapist who will work with him/ is working with him

## 2023-10-17 NOTE — Patient Instructions (Addendum)
 Hep B#1 and HPV #1 today, and needs appt for the additional 2 scheduled at checkout F/U with MD in 12 to 13 weeks Flu vaccine in 2 months  [facility] [place] [facility] in patient [phone], pt states we need to refer him to the facility, currently on the phone with the facility, nurse pls call and see if there is anything he needs for us  to do , this is an inpt facility for rehab , which he needs  HBA1C, lipid,  today   Higher dose wegovy  for next 3 months  Need to take medications every day as prescribed  Need to use cPAP   Thanks for choosing [facility], we consider it a privelige to serve you.

## 2023-10-18 ENCOUNTER — Ambulatory Visit: Payer: Self-pay | Admitting: Family Medicine

## 2023-10-18 LAB — HEMOGLOBIN A1C
Est. average glucose Bld gHb Est-mCnc: 117 mg/dL
Hgb A1c MFr Bld: 5.7 % — ABNORMAL HIGH (ref 4.8–5.6)

## 2023-10-18 LAB — LIPID PANEL
Chol/HDL Ratio: 3.5 ratio (ref 0.0–5.0)
Cholesterol, Total: 137 mg/dL (ref 100–199)
HDL: 39 mg/dL — ABNORMAL LOW (ref >39)
LDL Chol Calc (NIH): 83 mg/dL (ref 0–99)
Triglycerides: 78 mg/dL (ref 0–149)
VLDL Cholesterol Cal: 15 mg/dL (ref 5–40)

## 2023-10-19 ENCOUNTER — Telehealth: Payer: Self-pay | Admitting: Family Medicine

## 2023-10-19 ENCOUNTER — Telehealth: Payer: Self-pay

## 2023-10-19 MED ORDER — ALBUTEROL SULFATE HFA 108 (90 BASE) MCG/ACT IN AERS: 1.0000 | INHALATION_SPRAY | Freq: Four times a day (QID) | RESPIRATORY_TRACT | 3 refills | Status: AC | PRN

## 2023-10-19 NOTE — Telephone Encounter (Signed)
 Copied from CRM #[id number]. Topic: Clinical - Medication Refill >> Oct 19, 2023  1:40 PM Dawna HERO wrote: Medication: albuterol  (VENTOLIN  HFA) 108 (90 Base) MCG/ACT inhaler  Has the patient contacted their pharmacy? No,been a while since seen the doctor  (Agent: If no, request that the patient contact the pharmacy for the refill. If patient does not wish to contact the pharmacy document the reason why and proceed with request.) (Agent: If yes, when and what did the pharmacy advise?)  This is the patient's preferred pharmacy:  [facility] [address], [place] - [address] [place] [address] Phone: [phone] Fax: [phone]  Is this the correct pharmacy for this prescription? Yes If no, delete pharmacy and type the correct one.   Has the prescription been filled recently? No  Is the patient out of the medication? Yes  Has the patient been seen for an appointment in the last year OR does the patient have an upcoming appointment? Yes  Can we respond through MyChart? Yes  Agent: Please be advised that Rx refills may take up to 3 business days. We ask that you follow-up with your pharmacy.

## 2023-10-20 ENCOUNTER — Telehealth: Payer: Self-pay

## 2023-10-20 NOTE — Telephone Encounter (Signed)
 He is overdue due for a visit, sees Dr. Jude at [facility]. He would need a visit before prescribing medication or pcp can send in RX

## 2023-10-20 NOTE — Telephone Encounter (Signed)
 Copied from CRM [id number]. Topic: Clinical - Medical Advice >> Oct 20, 2023  3:44 PM Shawn Ramsey wrote: Reason for CRM: Patients mother states the patient blood pressure was high 151/91 and she wanted to inform Dr Antonetta his blood pressure was high today   Pt num [phone] (H)

## 2023-10-20 NOTE — Telephone Encounter (Signed)
 Lvm informing

## 2023-10-20 NOTE — Telephone Encounter (Signed)
 Copied from CRM #[id number]. Topic: Clinical - Prescription Issue >> Oct 19, 2023  1:24 PM Whitney O wrote: Reason for CRM: patient mom is calling because his primary care doctor wants patient to start taking the PULMICORT  0.25 MG/2ML nebulizer solution [[id number]] again but its showing dr shellia was the one  who prescribed this medication but he has seen almarie ferrari 03/13/2023 please refill medication for patient . His primary care doctor is wanting his to start this again  Is this okay for me to refill?

## 2023-10-20 NOTE — Telephone Encounter (Signed)
 Copied from CRM [id number]. Topic: Clinical - Medication Question >> Oct 20, 2023  1:25 PM Santiya F wrote: Reason for CRM: Patient's mother is calling in checking on the status of the PULMICORT  0.25 MG/2ML nebulizer solution [[id number]] she requested. Please follow up with patient's mom.

## 2023-10-20 NOTE — Telephone Encounter (Signed)
 Tried to call PT was unavailable   Spoke w/ corlis IDA - will try and reach out to PCP for Rx  -NFN

## 2023-10-24 NOTE — Telephone Encounter (Signed)
 Lvm informing Mother to make appt

## 2023-10-25 ENCOUNTER — Telehealth: Payer: Self-pay | Admitting: Pharmacy Technician

## 2023-10-25 ENCOUNTER — Other Ambulatory Visit (HOSPITAL_COMMUNITY): Payer: Self-pay

## 2023-10-25 NOTE — Telephone Encounter (Signed)
 Pharmacy Patient Advocate Encounter   Received notification from Onbase that prior authorization for Albuterol  Sulfate HFA 108 (90 Base)MCG/ACT aerosol  is required/requested.   Insurance verification completed.   The patient is insured through [facility] MEDICAID .   Per test claim:  Brand name Ventolin  HFA is preferred by the insurance.  If suggested medication is appropriate, Please send in a new RX and discontinue this one. If not, please advise as to why it's not appropriate so that we may request a Prior Authorization. Please note, some preferred medications may still require a PA.  If the suggested medications have not been trialed and there are no contraindications to their use, the PA will not be submitted, as it will not be approved.  New prescription not required. Medications are interchangable at the pharmacy. Called [facility] in [place] and spoke with Mitzie. She advised medication was dispensed as brand name and patient has already picked it up.

## 2023-10-26 ENCOUNTER — Telehealth: Admitting: *Deleted

## 2023-10-30 ENCOUNTER — Telehealth: Payer: Self-pay

## 2023-10-30 ENCOUNTER — Other Ambulatory Visit: Payer: Self-pay | Admitting: Licensed Clinical Social Worker

## 2023-10-30 NOTE — Telephone Encounter (Signed)
 Copied from CRM [id number]. Topic: General - Other >> Oct 30, 2023  3:52 PM Delon HERO wrote: Reason for CRM: Patient is calling to let Dr. Kit nurse know that someone contacted him about a detox program. He is unsure who. And he is calling back to let her know that he did not contact the detox program.
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
## 2023-11-01 DIAGNOSIS — Z23 Encounter for immunization: Secondary | ICD-10-CM | POA: Insufficient documentation

## 2023-11-01 NOTE — Assessment & Plan Note (Signed)
  Patient re-educated about  the importance of commitment to a  minimum of 150 minutes of exercise per week as able.  The importance of healthy food choices with portion control discussed, as well as eating regularly and within a 12 hour window most days. The need to choose clean , green food 50 to 75% of the time is discussed, as well as to make water  the primary drink and set a goal of 64 ounces water  daily.       10/17/2023    2:38 PM 07/31/2023    8:36 PM 06/30/2023   11:11 PM  Weight /BMI  Weight 286 lb 275 lb 9.2 oz 275 lb 2.2 oz  Height 5' 5 (1.651 m) 5' 5 (1.651 m) 5' 5 (1.651 m)  BMI 47.59 kg/m2 45.86 kg/m2 45.78 kg/m2    Inc wegovy  dose

## 2023-11-01 NOTE — Assessment & Plan Note (Signed)
 Reports inconsistent use of CPAP encouraged to be diligent so his health is protected

## 2023-11-01 NOTE — Assessment & Plan Note (Signed)
 After obtaining informed consent, the hPV and Hep B vaccines are administered , with no adverse effect noted at the time of administration.

## 2023-11-01 NOTE — Assessment & Plan Note (Signed)
 Patient educated about the importance of limiting  Carbohydrate intake , the need to commit to daily physical activity for a minimum of 30 minutes , and to commit weight loss. The fact that changes in all these areas will reduce or eliminate all together the development of diabetes is stressed.      Latest Ref Rng & Units 10/17/2023    3:16 PM 09/13/2023    1:42 PM 09/13/2023    3:20 AM 07/31/2023    8:55 PM 06/30/2023   11:18 PM  Diabetic Labs  HbA1c 4.8 - 5.6 % 5.7       Chol 100 - 199 mg/dL 862       HDL >60 mg/dL 39       Calc LDL 0 - 99 mg/dL 83       Triglycerides 0 - 149 mg/dL 78       Creatinine 9.38 - 1.24 mg/dL  9.41  9.21  9.23  9.21       10/17/2023    3:01 PM 10/17/2023    2:38 PM 09/14/2023    9:00 AM 09/13/2023    6:54 PM 09/13/2023    6:31 PM 09/13/2023    5:30 PM 09/13/2023    4:18 PM  BP/Weight  Systolic BP 128 144   161 149 876  Diastolic BP 70 90   108 81 78  Wt. (Lbs)  286       BMI  47.59 kg/m2          Information is confidential and restricted. Go to Review Flowsheets to unlock data.      03/17/2023    3:20 PM 04/20/2021   10:20 AM  Foot/eye exam completion dates  Foot Form Completion Done Done

## 2023-11-06 ENCOUNTER — Telehealth: Payer: Self-pay | Admitting: Licensed Clinical Social Worker

## 2023-11-07 ENCOUNTER — Other Ambulatory Visit: Payer: Self-pay | Admitting: Family Medicine

## 2023-11-10 ENCOUNTER — Telehealth: Payer: Self-pay | Admitting: Licensed Clinical Social Worker

## 2023-11-16 ENCOUNTER — Telehealth: Payer: Self-pay | Admitting: Pharmacy Technician

## 2023-11-16 ENCOUNTER — Other Ambulatory Visit (HOSPITAL_COMMUNITY): Payer: Self-pay

## 2023-11-16 NOTE — Telephone Encounter (Signed)
 Pharmacy Patient Advocate Encounter   Received notification from CoverMyMeds that prior authorization for Wegovy  1MG /0.5ML auto-injectors is required/requested.   Insurance verification completed.   The patient is insured through [facility] MEDICAID .   Per test claim: Effective October 1st, Medicaid will discontinue coverage of GLP1 medications for weight loss (such as Wegovy  and Zepbound ), unless the patient has a documented history of a heart attack or stroke. Zepbound  will continue to be covered only for patients with moderate to severe sleep apnea (AHI 15-30). Because of this change, the prior authorization team will not be submitting new PA requests for GLP1 medications prescribed for weight loss between now and October 1st, as patients will be unable to continue therapy under Medicaid coverage.

## 2023-11-27 ENCOUNTER — Telehealth: Admitting: Licensed Clinical Social Worker

## 2023-11-29 ENCOUNTER — Telehealth: Payer: Self-pay

## 2023-11-30 NOTE — Telephone Encounter (Signed)
 noted

## 2023-12-05 ENCOUNTER — Other Ambulatory Visit: Payer: Self-pay | Admitting: Licensed Clinical Social Worker

## 2023-12-07 NOTE — Patient Instructions (Signed)
 Reyna GORMAN Rule - I am sorry I was unable to reach you today for our scheduled appointment. I work with Antonetta Rollene BRAVO, MD and am calling to support your healthcare needs. Please contact me at [phone] at your earliest convenience. I look forward to speaking with you soon.   Thank you,  Lyle Rung, BSW, MSW, LCSW Licensed Clinical Social Worker [employer]   [email or web address] Direct Dial: [phone]

## 2023-12-08 NOTE — Patient Outreach (Signed)
 Complex Care Management Care Guide Note  12/08/2023 Name: Shawn Ramsey MRN: [id number] DOB: 1980-04-28  Shawn Ramsey is a 43 y.o. year old male who is a primary care patient of Antonetta Rollene BRAVO, MD and is actively engaged with the care management team. Outreach to  Shawn GORMAN Rule by phone today was no longer need to assist with re-scheduling  with the Licensed Clinical Child psychotherapist.  Follow up plan: Already has a Telephone appointment with complex care management team member scheduled for:  12/13/23  Shereen Gin [facility]  Population Health VBCI Assistant Direct Dial: [phone]  Fax: [phone] Website: [email or web address]

## 2023-12-13 ENCOUNTER — Other Ambulatory Visit: Payer: Self-pay | Admitting: Licensed Clinical Social Worker

## 2023-12-13 NOTE — Patient Instructions (Signed)
 Visit Information  Thank you for taking time to visit with me today. Please don't hesitate to contact me if I can be of assistance to you in the future!  Please call the care guide team at [phone] if you need to cancel, schedule, or reschedule an appointment.   Please call the Suicide and Crisis Lifeline: 988 call the [place]  National Suicide Prevention Lifeline: 272-562-3382 or TTY: 312-094-4571 TTY (331)360-2008) to talk to a trained counselor call 1-800-273-TALK (toll free, 24 hour hotline) go to [facility] [address], [place] [phone]) call the [facility] Crisis Line: 2101673825 call 911 if you are experiencing a Mental Health or Behavioral Health Crisis or need someone to talk to.  Lyle Rung, BSW, MSW, LCSW Licensed Clinical Social Worker [employer]   [email or web address] Direct Dial: [phone]
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
## 2023-12-18 ENCOUNTER — Ambulatory Visit

## 2023-12-20 ENCOUNTER — Other Ambulatory Visit (HOSPITAL_COMMUNITY): Payer: Self-pay

## 2023-12-29 ENCOUNTER — Ambulatory Visit (HOSPITAL_COMMUNITY)

## 2024-01-05 ENCOUNTER — Inpatient Hospital Stay (HOSPITAL_COMMUNITY): Admission: RE | Admit: 2024-01-05 | Source: Ambulatory Visit

## 2024-01-09 ENCOUNTER — Other Ambulatory Visit (HOSPITAL_COMMUNITY): Payer: Self-pay

## 2024-01-10 ENCOUNTER — Other Ambulatory Visit: Payer: Self-pay

## 2024-01-10 DIAGNOSIS — F1090 Alcohol use, unspecified, uncomplicated: Secondary | ICD-10-CM | POA: Diagnosis not present

## 2024-01-10 DIAGNOSIS — R0602 Shortness of breath: Secondary | ICD-10-CM | POA: Diagnosis present

## 2024-01-10 DIAGNOSIS — F1490 Cocaine use, unspecified, uncomplicated: Secondary | ICD-10-CM | POA: Diagnosis not present

## 2024-01-10 DIAGNOSIS — Z91148 Patient's other noncompliance with medication regimen for other reason: Secondary | ICD-10-CM | POA: Diagnosis not present

## 2024-01-10 DIAGNOSIS — J449 Chronic obstructive pulmonary disease, unspecified: Secondary | ICD-10-CM | POA: Diagnosis not present

## 2024-01-10 DIAGNOSIS — I509 Heart failure, unspecified: Secondary | ICD-10-CM | POA: Diagnosis not present

## 2024-01-10 NOTE — ED Triage Notes (Signed)
 Pt is requesting detox from alcohol and cocaine, pts last drink was yesterday morning and last used cocaine pta. Pt reports he normally drinks a gallon of liquor daily. Pt has no medical complaints/withdraw symptoms at this time. 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

## 2024-01-11 ENCOUNTER — Emergency Department
Admission: EM | Admit: 2024-01-11 | Discharge: 2024-01-11 | Disposition: A | Attending: Emergency Medicine | Admitting: Emergency Medicine

## 2024-01-11 DIAGNOSIS — F1490 Cocaine use, unspecified, uncomplicated: Secondary | ICD-10-CM

## 2024-01-11 DIAGNOSIS — Z91148 Patient's other noncompliance with medication regimen for other reason: Secondary | ICD-10-CM

## 2024-01-11 DIAGNOSIS — R0602 Shortness of breath: Secondary | ICD-10-CM

## 2024-01-11 DIAGNOSIS — F1090 Alcohol use, unspecified, uncomplicated: Secondary | ICD-10-CM

## 2024-01-11 DIAGNOSIS — I10 Essential (primary) hypertension: Secondary | ICD-10-CM

## 2024-01-11 MED ORDER — TORSEMIDE 20 MG PO TABS: 20.0000 mg | ORAL_TABLET | Freq: Two times a day (BID) | ORAL | 3 refills | Status: AC

## 2024-01-11 NOTE — Discharge Instructions (Addendum)
 Take your torsemide  daily as prescribed which can help with your fluid retention and shortness of breath.  See the list of attached resources to help with drug and alcohol use detoxification programs.   Thank you for choosing us  for your health care today!  Please see your primary doctor this week for a follow up appointment.   If you have any new, worsening, or unexpected symptoms call your doctor right away or come back to the emergency department for reevaluation.  It was my pleasure to care for you today.   Ginnie EDISON Cyrena, MD

## 2024-01-11 NOTE — ED Provider Notes (Signed)
 [facility] Provider Note    Event Date/Time   First MD Initiated Contact with Patient 01/11/24 0121     (approximate)   History   Drug / Alcohol Assessment   HPI  Akshith ANTRELL TIPLER is a 43 y.o. male   Past medical history of alcohol and drug use, COPD, CHF, cognitive delay, tracheostomy, here with request for detoxification after using cocaine and drinking alcohol.  He reports not being able to go to a detox facility due to his tracheostomy status.  He reports last alcohol use within the last 24 hours.  He reports no other acute medical complaints.   External Medical Documents Reviewed: Prior outpatient notes      Physical Exam   Triage Vital Signs: ED Triage Vitals  Encounter Vitals Group     BP 01/10/24 2048 (!) 161/104     Girls Systolic BP Percentile --      Girls Diastolic BP Percentile --      Boys Systolic BP Percentile --      Boys Diastolic BP Percentile --      Pulse Rate 01/10/24 2048 84     Resp 01/10/24 2048 18     Temp 01/10/24 2048 98.2 F (36.8 C)     Temp src --      SpO2 01/10/24 2048 97 %     Weight --      Height --      Head Circumference --      Peak Flow --      Pain Score 01/10/24 2046 0     Pain Loc --      Pain Education --      Exclude from Growth Chart --     Most recent vital signs: Vitals:   01/10/24 2048  BP: (!) 161/104  Pulse: 84  Resp: 18  Temp: 98.2 F (36.8 C)  SpO2: 97%    General: Awake, no distress.  CV:  Good peripheral perfusion.  Resp:  Normal effort.  Abd:  No distention.  Other:  Awake alert comfortable appearing in no acute distress hypertensive otherwise vital signs normal breathing comfortably on room air, clear lungs without focality or wheezing.  No tremors or tongue fasciculation.  Awake alert oriented pleasant gentleman.   ED Results / Procedures / Treatments   Labs (all labs ordered are listed, but only abnormal results are displayed) Labs Reviewed - No data to  display      PROCEDURES:  Critical Care performed: No  Procedures   MEDICATIONS ORDERED IN ED: Medications - No data to display   IMPRESSION / MDM / ASSESSMENT AND PLAN / ED COURSE  I reviewed the triage vital signs and the nursing notes.                                Patient's presentation is most consistent with acute presentation with potential threat to life or bodily function.  Differential diagnosis includes, but is not limited to, withdrawal, intoxication, heart failure exacerbation, respiratory infection, COPD.   MDM:    Here for request for detox that does not appear acutely intoxicated nor in withdrawal at this time.  I will give him resources for detox programs as outpatient.  Later in our conversation he also notes that he has been feeling that he has been holding onto fluid and has not been taking his torsemide  because of forgetting in the context of  several different life stressors in the moment.  He has been feeling short of breath especially with exertion and orthopnea as well.  He denies any respiratory infectious symptoms or chest pain.  I will give him a prescription for his torsemide  and encouraged him to use as prescribed.     I considered hospitalization for admission or observation however I think outpatient management at this time is appropriate given no signs of acute withdrawal, and he does not appear to have acute heart failure exacerbation and his clinical exam, respiratory status, normal hemodynamics no hypoxemia.        FINAL CLINICAL IMPRESSION(S) / ED DIAGNOSES   Final diagnoses:  Alcohol use  Cocaine use  Shortness of breath  Noncompliance with medication regimen     Rx / DC Orders   ED Discharge Orders          Ordered    torsemide  (DEMADEX ) 20 MG tablet  2 times daily        01/11/24 0225             Note:  This document was prepared using Dragon voice recognition software and may include unintentional  dictation errors.    Cyrena Mylar, MD 01/11/24 0230

## 2024-02-03 ENCOUNTER — Other Ambulatory Visit: Payer: Self-pay | Admitting: Family Medicine

## 2024-02-09 ENCOUNTER — Ambulatory Visit (HOSPITAL_COMMUNITY)
Admission: RE | Admit: 2024-02-09 | Discharge: 2024-02-09 | Disposition: A | Discharge status: Home / Self Care | Source: Ambulatory Visit | Attending: Acute Care | Admitting: Acute Care

## 2024-02-09 DIAGNOSIS — G4733 Obstructive sleep apnea (adult) (pediatric): Secondary | ICD-10-CM

## 2024-02-09 DIAGNOSIS — Z930 Tracheostomy status: Secondary | ICD-10-CM

## 2024-02-09 DIAGNOSIS — Z556 Problems related to health literacy: Secondary | ICD-10-CM | POA: Diagnosis present

## 2024-02-09 DIAGNOSIS — E662 Morbid (severe) obesity with alveolar hypoventilation: Secondary | ICD-10-CM | POA: Diagnosis present

## 2024-02-09 NOTE — Progress Notes (Signed)
 Reason for visit  Trach management/ possible decannulation   HPI Shawn Ramsey is well known to me. I follow him for trach management in the setting of MO w/ associated OHS and OSA. CPAP titration study on 12/3 thru [facility] study showing optimal positive pressure 15cmH2O on 12/3. He is still struggling w/ cpap compliance but has been trying more consistently as of late. Also reports that he more recently had increased his diuretic because of increased LE swelling  Here today for planned change.    Review of Systems  Constitutional: Negative.   HENT: Negative.    Eyes: Negative.   Respiratory: Negative.    Cardiovascular: Negative.   Gastrointestinal: Negative.   Genitourinary: Negative.   Skin: Negative.   All other systems reviewed and are negative.   Exam   Physical Exam Constitutional:      General: He is not in acute distress.    Appearance: Normal appearance. He is not ill-appearing.  HENT:     Head: Normocephalic.     Nose: Nose normal.     Mouth/Throat:     Pharynx: No oropharyngeal exudate.  Eyes:     Pupils: Pupils are equal, round, and reactive to light.  Neck:     Trachea: Tracheostomy present.     Comments: Trach stoma is intact w/out discharge but does have significant peristomal granulation tissue surrounding the stoma  Cardiovascular:     Rate and Rhythm: Normal rate.     Pulses: Normal pulses.  Pulmonary:     Effort: Pulmonary effort is normal.     Breath sounds: Normal breath sounds. No stridor or decreased air movement.  Abdominal:     General: Bowel sounds are normal.     Palpations: Abdomen is soft.  Musculoskeletal:     Cervical back: Normal range of motion and neck supple.  Neurological:     General: No focal deficit present.     Mental Status: He is alert and oriented to person, place, and time.     Procedure The trach was removed. The site was inspected and as noted in exam  New 6 proximal XLT cuffless trach placed w/out difficulty  over obturator. Pt tolerated well. Placement verified via ETCO2   Impression/plan Active Problems:   Tracheostomy status (HCC)   Morbid obesity due to excess calories (HCC)   OSA (obstructive sleep apnea)   Obesity hypoventilation syndrome (HCC)   Difficulty demonstrating health literacy    Tracheostomy status (HCC) Overview: Followed at [facility].  Size 6 cuffless proximal XLT shiley Last change today 12/19   Discussion His trach is stable and he is clear for decannulation IMO once he can prove he will use his CPAP regularly. His compliance continues being the primary barrier to decannulation & I'm feeling less confident that he will meet this goal (continues to be his largest barrier)   Plan ROV 12 weeks  Cont routine trach care Would only consider decannulation after he demonstrates he can be compliant w/ CPAP (has been almost a year and still struggling)             This visit: review of most recent records, direct face to face time obtaining history, performing physical exam, developing and documenting plan as well as discussing this plan with the patient and/or care givers.  My billing time 16 level 2:  99212 (>10min)

## 2024-02-09 NOTE — Progress Notes (Signed)
Tracheostomy Procedure Note  Shawn Ramsey [id number] [birth year]  Pre Procedure Tracheostomy Information  Trach Brand: Shiley Size:  6.0 6XLTUP Style: Uncuffed Secured by: Velcro   Procedure: Trach Change and Trach Cleaning    Post Procedure Tracheostomy Information  Trach Brand: Shiley Size:  6.0 6XLTUP Style: Uncuffed Secured by: Velcro   Post Procedure Evaluation:  ETCO2 positive color change from yellow to purple : Yes.   Vital signs:VSS Patients current condition: stable Complications: No apparent complications Trach site exam: clean, dry Wound care done: 4 x 4 gauze drain Patient did tolerate procedure well.   Education: none  Prescription needs: none    Additional needs: New PMV given to patient at this visit

## 2024-02-19 ENCOUNTER — Encounter (HOSPITAL_COMMUNITY): Payer: Self-pay

## 2024-02-19 ENCOUNTER — Emergency Department (HOSPITAL_COMMUNITY): Admission: EM | Admit: 2024-02-19 | Discharge: 2024-02-19 | Discharge status: Against Medical Advice

## 2024-02-19 ENCOUNTER — Other Ambulatory Visit: Payer: Self-pay

## 2024-02-19 ENCOUNTER — Emergency Department (HOSPITAL_COMMUNITY): Admission: EM | Admit: 2024-02-19 | Discharge: 2024-02-19 | Disposition: A | Discharge status: Home / Self Care

## 2024-02-19 DIAGNOSIS — J189 Pneumonia, unspecified organism: Secondary | ICD-10-CM | POA: Insufficient documentation

## 2024-02-19 DIAGNOSIS — R059 Cough, unspecified: Secondary | ICD-10-CM | POA: Diagnosis present

## 2024-02-19 MED ORDER — AMOXICILLIN-POT CLAVULANATE 875-125 MG PO TABS
1.0000 | ORAL_TABLET | Freq: Once | ORAL | Status: AC
  Administered 2024-02-19: 1 via ORAL
  Filled 2024-02-19: qty 1

## 2024-02-19 MED ORDER — AZITHROMYCIN 250 MG PO TABS
500.0000 mg | ORAL_TABLET | Freq: Once | ORAL | Status: AC
  Administered 2024-02-19: 500 mg via ORAL
  Filled 2024-02-19: qty 2

## 2024-02-19 MED ORDER — AMOXICILLIN-POT CLAVULANATE 875-125 MG PO TABS: 1.0000 | ORAL_TABLET | Freq: Two times a day (BID) | ORAL | 0 refills | Status: AC

## 2024-02-19 MED ORDER — ALBUTEROL SULFATE HFA 108 (90 BASE) MCG/ACT IN AERS
2.0000 | INHALATION_SPRAY | Freq: Once | RESPIRATORY_TRACT | Status: AC
  Administered 2024-02-19: 2 via RESPIRATORY_TRACT
  Filled 2024-02-19: qty 6.7

## 2024-02-19 MED ORDER — AZITHROMYCIN 250 MG PO TABS: 500.0000 mg | ORAL_TABLET | Freq: Every day | ORAL | 0 refills | Status: AC

## 2024-02-19 NOTE — ED Triage Notes (Signed)
 Pt reports he was diagnosed with pneumonia at [facility] last night and he was given doxycycline .  Pt reports whenever he takes it his vision gets impaired for a little while so he wants the abx changed.  Pt is also concerned that he should have been admitted since he has pneumonia so he would like a second opinion.

## 2024-02-19 NOTE — ED Provider Notes (Signed)
 " [place] EMERGENCY DEPARTMENT AT [facility] Provider Note   CSN: [id number] Arrival date & time: 02/19/24  2141     Patient presents with: Medication Reaction   Shawn Ramsey is a 43 y.o. male.   43 year old male presents for evaluation of pneumonia.  He states he was prescribed doxycycline  after being diagnosed with pneumonia and does not like the way it makes him feel.  States it gives him blurry vision.  He would like an antibiotic.  He admits that he is still coughing but denies any other symptoms or concerns at this time.        Prior to Admission medications  Medication Sig Start Date End Date Taking? Authorizing Provider  amoxicillin -clavulanate (AUGMENTIN ) 875-125 MG tablet Take 1 tablet by mouth every 12 (twelve) hours. 02/19/24  Yes Zarahi Fuerst L, DO  azithromycin  (ZITHROMAX ) 250 MG tablet Take 2 tablets (500 mg total) by mouth daily for 3 days. Take first 2 tablets together, then 1 every day until finished. 02/19/24 02/22/24 Yes Gerda Yin L, DO  albuterol  (VENTOLIN  HFA) 108 (90 Base) MCG/ACT inhaler Inhale 1-2 puffs into the lungs every 6 (six) hours as needed for wheezing or shortness of breath. 10/19/23   Antonetta Rollene BRAVO, MD  atorvastatin  (LIPITOR) 40 MG tablet Take 1 tablet by mouth once daily 02/05/24   Antonetta Rollene BRAVO, MD  Cholecalciferol (VITAMIN D3 PO) Take 1 tablet by mouth daily.    [provider]  potassium chloride  SA (KLOR-CON  M) 20 MEQ tablet Take 1 tablet (20 mEq total) by mouth 2 (two) times daily. 06/28/23   Antonetta Rollene BRAVO, MD  PULMICORT  0.25 MG/2ML nebulizer solution Take 2 mLs (0.25 mg total) by nebulization in the morning and at bedtime. Patient not taking: Reported on 10/17/2023 04/22/21   Sood, Vineet, MD  sertraline  (ZOLOFT ) 100 MG tablet Take 2 tablets (200 mg total) by mouth daily. 06/28/23   Antonetta Rollene BRAVO, MD  tadalafil  (CIALIS ) 20 MG tablet TAKE 1/2 TO 1 (ONE-HALF TO ONE) TABLET BY MOUTH EVERY OTHER  DAY AS NEEDED FOR ERECTILE DYSFUNCTION Patient taking differently: Take 10-20 mg by mouth as directed. Take every other day as needed for erectile dysfunction 06/26/23   Antonetta Rollene BRAVO, MD  torsemide  (DEMADEX ) 20 MG tablet Take 1 tablet (20 mg total) by mouth 2 (two) times daily. 01/11/24 04/10/24  Cyrena Mylar, MD  WEGOVY  1 MG/0.5ML SOAJ SQ injection Inject 1 mg into the skin once a week. 10/17/23   Antonetta Rollene BRAVO, MD    Allergies: Patient has no known allergies.    Review of Systems  Constitutional:  Negative for chills and fever.  HENT:  Negative for ear pain and sore throat.   Eyes:  Negative for pain and visual disturbance.  Respiratory:  Positive for cough. Negative for shortness of breath.   Cardiovascular:  Negative for chest pain and palpitations.  Gastrointestinal:  Negative for abdominal pain and vomiting.  Genitourinary:  Negative for dysuria and hematuria.  Musculoskeletal:  Negative for arthralgias and back pain.  Skin:  Negative for color change and rash.  Neurological:  Negative for seizures and syncope.  All other systems reviewed and are negative.   Updated Vital Signs BP 135/88 (BP Location: Right Arm)   Pulse 90   Temp 98.9 F (37.2 C)   Resp 18   Wt 129.7 kg   SpO2 93%   BMI 47.58 kg/m   Physical Exam Vitals and nursing note reviewed.  Constitutional:  General: He is not in acute distress.    Appearance: Normal appearance. He is well-developed. He is not ill-appearing.  HENT:     Head: Normocephalic and atraumatic.  Eyes:     Conjunctiva/sclera: Conjunctivae normal.  Cardiovascular:     Rate and Rhythm: Normal rate and regular rhythm.     Heart sounds: No murmur heard. Pulmonary:     Effort: Pulmonary effort is normal. No respiratory distress.     Breath sounds: Normal breath sounds.  Abdominal:     Palpations: Abdomen is soft.     Tenderness: There is no abdominal tenderness.  Musculoskeletal:        General: No swelling.      Cervical back: Neck supple.  Skin:    General: Skin is warm and dry.     Capillary Refill: Capillary refill takes less than 2 seconds.  Neurological:     General: No focal deficit present.     Mental Status: He is alert.  Psychiatric:        Mood and Affect: Mood normal.     (all labs ordered are listed, but only abnormal results are displayed) Labs Reviewed - No data to display  EKG: None  Radiology: No results found.   Procedures   Medications Ordered in the ED  albuterol  (VENTOLIN  HFA) 108 (90 Base) MCG/ACT inhaler 2 puff (has no administration in time range)  azithromycin  (ZITHROMAX ) tablet 500 mg (has no administration in time range)  amoxicillin -clavulanate (AUGMENTIN ) 875-125 MG per tablet 1 tablet (has no administration in time range)                                    Medical Decision Making Patient here for medication switch.  Will start him on Augmentin  and azithromycin  he does not like doxycycline .  He has run out of his albuterol  inhaler and I will give him 1 failure to go home with as well.  Gave him his first dose of antibiotics here.  He otherwise appears well with stable vital signs and appropriate oxygen  saturation.  Has no other new or worsening symptoms from his pneumonia.  Advise close up with primary care and otherwise return to the ER should he get worse.  He feels comfortable being discharged home.  Problems Addressed: Pneumonia due to infectious organism, unspecified laterality, unspecified part of lung: acute illness or injury  Amount and/or Complexity of Data Reviewed External Data Reviewed: notes.    Details: Prior ED records reviewed and patient seen yesterday in the ER and diagnosed with pneumonia, prescribed doxycycline   Risk OTC drugs. Prescription drug management.     Final diagnoses:  Pneumonia due to infectious organism, unspecified laterality, unspecified part of lung    ED Discharge Orders          Ordered     amoxicillin -clavulanate (AUGMENTIN ) 875-125 MG tablet  Every 12 hours        02/19/24 2233    azithromycin  (ZITHROMAX ) 250 MG tablet  Daily        02/19/24 2233               Gennaro Duwaine CROME, DO 02/19/24 2253  "

## 2024-02-19 NOTE — Discharge Instructions (Signed)
 Take your azithromycin  and your Augmentin  as prescribed.  You can discontinue doxycycline .  Use your inhaler as needed.  Follow-up with your primary care doctor in 1 to 2 weeks.  Return to the ER for new or worsening symptoms.

## 2024-02-25 NOTE — ED Provider Notes (Signed)
 [facility] Emergency Department Provider Note   ED Course, Assessment, and Plan   ED Course as of 02/25/24 9360  [place] Feb 25, 2024  602 44 year old male presenting to the ED seeking resources for alcohol and drug rehab as well as for evaluation of shortness of breath and chest pain in the setting of recent pneumonia diagnosis.  He was diagnosed with pneumonia on 12/28 and started on doxycycline  but had some side effects from the medication so he presented back to the ED and was given Augmentin  with azithromycin  which he states he is still taking.  He reports continued shortness of breath and chest discomfort.  He does state that he has been seeking a treatment facility for alcohol and drug use as he states he uses cocaine and crack regularly and was also drinking alcohol and using cocaine earlier today.  He denies fevers or chills but has had some coughing.  Vitals grossly reassuring.  On exam patient is tired appearing but easily arousable, somewhat slurring his speech during conversation and actively falling asleep, lungs clear bilaterally without wheezing or crackles, normal work of breathing and no dyspnea during conversation.  He had labs EKG, and chest x-ray ordered from triage that we will follow-up on.  0122 EKG shows normal sinus rhythm with right bundle branch block, no acute ischemic changes, no STEMI.  Labs so far unremarkable with no leukocytosis and no troponin elevation.  Chest x-ray shows subtle peribronchial thickening bilaterally improved from prior RLL pneumonia. Serial troponins, EKG, ethanol, and BNP pending.  9377 Serial EKG and troponins without significant changes. He is more alert and awake and we discussed his results and recommended he try a cough syrup to help with his cough symptoms. Also provided information for [facility] in [place] which he expressed understanding and agreement with. Return precautions discussed and provided.    _____________________________________________________________________  Additional Medical Decision Making   - Any discussion of this patient's case/presentation between myself and consultants, admitting teams, or other team members has been documented above. - Imaging and other studies, if performed, that were available during my care of the patient were independently reviewed and interpreted by me and considered in my medical decision making as documented above.  History   Chief Complaint:  Chief Complaint  Patient presents with   Chest Pain    History of Present Illness:  Shawn Ramsey is a 44 y.o. male with history as above.  Past Medical History: Past Medical History[1]  Allergies:  Patient has no known allergies.  Past Surgical History:  Past Surgical History[2]  Social History:  Social History   Tobacco Use   Smoking status: Some Days    Types: Cigarettes   Smokeless tobacco: Never  Substance Use Topics   Alcohol use: Yes    Family History: Family History[3]   Physical Exam   Vital Signs:   BP (!) 152/95   Pulse 86   Temp 36.8 C (98.2 F) (Oral)   Resp 21   Ht 165.1 cm (5' 5)   Wt (!) 132.6 kg (292 lb 6.4 oz)   SpO2 97%   BMI 48.66 kg/m   General: Awake, in no distress. Skin: Warm, dry. Heart: Regular rhythm, normal heart sounds. Lungs: Normal work of breathing. Abdomen: Soft, non-tender. Neurological: No gross focal neurologic deficits are appreciated. Psychiatric: Calm and cooperative. _____________________________________________________________________  Please note - This documentation was generated using dictation and/or voice recognition software, and as such, may contain spelling or other transcription errors.  Any questions regarding the content of this documentation should be directed to the individual who electronically signed.       [1] Past Medical History: Diagnosis Date   CHF (congestive heart failure)  (CMS-HCC)    Diabetes mellitus (CMS-HCC)    Drug abuse (CMS-HCC)    Hyperlipemia    Tobacco abuse disorder   [2] Past Surgical History: Procedure Laterality Date   TRACHEOSTOMY    [3] No family history on file.  Claiborne Donnajean CROME, MD 02/25/24 [phone]

## 2024-03-05 ENCOUNTER — Encounter: Payer: Self-pay | Admitting: Family Medicine

## 2024-03-05 ENCOUNTER — Ambulatory Visit: Admitting: Family Medicine

## 2024-03-05 VITALS — BP 110/58 | HR 82 | Resp 16 | Ht 65.0 in | Wt 292.0 lb

## 2024-03-05 DIAGNOSIS — E559 Vitamin D deficiency, unspecified: Secondary | ICD-10-CM | POA: Diagnosis not present

## 2024-03-05 DIAGNOSIS — R7303 Prediabetes: Secondary | ICD-10-CM

## 2024-03-05 DIAGNOSIS — Z09 Encounter for follow-up examination after completed treatment for conditions other than malignant neoplasm: Secondary | ICD-10-CM | POA: Diagnosis not present

## 2024-03-05 DIAGNOSIS — J3089 Other allergic rhinitis: Secondary | ICD-10-CM | POA: Diagnosis not present

## 2024-03-05 DIAGNOSIS — Z23 Encounter for immunization: Secondary | ICD-10-CM

## 2024-03-05 DIAGNOSIS — I10 Essential (primary) hypertension: Secondary | ICD-10-CM | POA: Diagnosis not present

## 2024-03-05 DIAGNOSIS — E785 Hyperlipidemia, unspecified: Secondary | ICD-10-CM

## 2024-03-05 DIAGNOSIS — F1910 Other psychoactive substance abuse, uncomplicated: Secondary | ICD-10-CM

## 2024-03-05 DIAGNOSIS — Z0001 Encounter for general adult medical examination with abnormal findings: Secondary | ICD-10-CM

## 2024-03-05 MED ORDER — MONTELUKAST SODIUM 10 MG PO TABS: 10.0000 mg | ORAL_TABLET | Freq: Every day | ORAL | 3 refills | Status: AC

## 2024-03-05 MED ORDER — FLUTICASONE PROPIONATE 50 MCG/ACT NA SUSP: 2.0000 | Freq: Every day | NASAL | 6 refills | Status: AC

## 2024-03-05 NOTE — Patient Instructions (Addendum)
 F/U in 4 months  Hep B#2, HPV and flu vaccine today  hBA1c, lipid, tSH and vit d today  Foot exam today  Singulair  and flonase  are prescribed for your alergies  With the medical history that you have I do nOT recommend you donate plasma, so form is returned and not completed or signed by me

## 2024-03-06 ENCOUNTER — Ambulatory Visit: Payer: Self-pay | Admitting: Family Medicine

## 2024-03-06 LAB — VITAMIN D 25 HYDROXY (VIT D DEFICIENCY, FRACTURES): Vit D, 25-Hydroxy: 17.3 ng/mL — ABNORMAL LOW (ref 30.0–100.0)

## 2024-03-06 LAB — HEMOGLOBIN A1C
Est. average glucose Bld gHb Est-mCnc: 114 mg/dL
Hgb A1c MFr Bld: 5.6 % (ref 4.8–5.6)

## 2024-03-06 LAB — LIPID PANEL
Chol/HDL Ratio: 4.4 ratio (ref 0.0–5.0)
Cholesterol, Total: 129 mg/dL (ref 100–199)
HDL: 29 mg/dL — ABNORMAL LOW
LDL Chol Calc (NIH): 73 mg/dL (ref 0–99)
Triglycerides: 157 mg/dL — ABNORMAL HIGH (ref 0–149)
VLDL Cholesterol Cal: 27 mg/dL (ref 5–40)

## 2024-03-06 LAB — TSH: TSH: 1.88 u[IU]/mL (ref 0.450–4.500)

## 2024-03-06 MED ORDER — VITAMIN D (ERGOCALCIFEROL) 1.25 MG (50000 UNIT) PO CAPS: 50000.0000 [IU] | ORAL_CAPSULE | ORAL | 8 refills | Status: AC

## 2024-03-10 ENCOUNTER — Encounter: Payer: Self-pay | Admitting: Family Medicine

## 2024-03-10 DIAGNOSIS — Z23 Encounter for immunization: Secondary | ICD-10-CM | POA: Insufficient documentation

## 2024-03-10 NOTE — Assessment & Plan Note (Signed)
 Uncontrolled, flonase  and singulair  prescribed

## 2024-03-10 NOTE — Assessment & Plan Note (Signed)
 After obtaining informed consent, the influenza, hep Band HPV  vaccines are   administered , with no adverse effect noted at the time of administration.

## 2024-03-10 NOTE — Assessment & Plan Note (Signed)
 Patient in for follow up of recent ED visit at [facility] on 02/25/2024 wirh dx of pneumonia Reports symptom resolution , with nmo breathing difficulties Discharge summary, and laboratory and radiology data are reviewed, and any questions or concerns  are discussed. Specific issues requiring follow up are specifically addressed.

## 2024-03-10 NOTE — Progress Notes (Signed)
" ° ° °  Shawn Ramsey     MRN: [id number]      DOB: Mar 21, 1980  Chief Complaint  Patient presents with   Annual Exam    HPI: Patient is in for annual physical exam. F/u from recent ED visit x 2 with diagnosis of  pneumonia.  Immunization is reviewed , and  updated if needed.    PE; BP (!) 110/58   Pulse 82   Resp 16   Ht 5' 5 (1.651 m)   Wt 292 lb (132.5 kg)   SpO2 93%   BMI 48.59 kg/m   Pleasant male, alert and oriented x 3, in no cardio-pulmonary distress. Afebrile. HEENT No facial trauma or asymetry. Sinuses non tender. EOMI External ears normal,  Neck: supple, no adenopathy,JVD or thyromegaly.No bruits.  Chest: Clear to ascultation bilaterally.No crackles or wheezes. Non tender to palpation  Cardiovascular system; Heart sounds normal,  S1 and  S2 ,no S3.  No murmur, or thrill.  Abdomen: Soft, non tender   Musculoskeletal exam: Decreased though adequate  ROM of spine, hips , shoulders and knees.  No muscle wasting or atrophy.   Neurologic: Cranial nerves 2 to 12 intact. Power, tone ,sensation and  normal throughout.  disturbance in gait. No tremor.  Skin: Intact, no ulceration, erythema , scaling or rash noted. Pigmentation normal throughout  Psych; Normal mood and affect. Judgement and concentration normal   Assessment & Plan:  Annual visit for general adult medical examination with abnormal findings Annual exam as documented. iImmunization and cancer screening needs are specifically addressed at this visit.   Immunization due After obtaining informed consent, the influenza, hep Band HPV  vaccines are   administered , with no adverse effect noted at the time of administration.   Substance abuse (HCC) Ongoing, plans to go fior in patient rehab if he will be accepted, challenged because of trach  Encounter for examination following treatment at hospital Patient in for follow up of recent ED visit at [facility] on 02/25/2024 wirh dx of  pneumonia Reports symptom resolution , with nmo breathing difficulties Discharge summary, and laboratory and radiology data are reviewed, and any questions or concerns  are discussed. Specific issues requiring follow up are specifically addressed.   Allergic rhinitis Uncontrolled, flonase  and singulair  prescribed  "

## 2024-03-10 NOTE — Assessment & Plan Note (Signed)
 Annual exam as documented. iImmunization and cancer screening needs are specifically addressed at this visit.

## 2024-03-10 NOTE — Assessment & Plan Note (Signed)
 Ongoing, plans to go fior in patient rehab if he will be accepted, challenged because of trach

## 2024-03-13 ENCOUNTER — Telehealth: Payer: Self-pay

## 2024-03-13 ENCOUNTER — Other Ambulatory Visit (HOSPITAL_COMMUNITY): Payer: Self-pay

## 2024-03-13 NOTE — ED Provider Notes (Signed)
 " [facility]  ED Provider Note Medical screening exam initiated in triage. HPI reviewed. Vital signs reviewed. No signs of distress in triage. Appropriate orders, if needed, have been initiated based on my screening examination.   Detox  Shawn Chow, PA-C 8:37 PM   Shawn Ramsey 44 y.o. male DOB: 08-Apr-1980 MRN: [id number] History   Chief Complaint  Patient presents with   Detox Evaluation    Patient reports that he wants detox from Alcohol and Crack Cocaine    HPI     Past Medical History:  Diagnosis Date   COPD (chronic obstructive pulmonary disease) (*)     No past surgical history on file.  Social History   Substance and Sexual Activity  Alcohol Use Yes   Comment: drinks every other day wine and liquor   Tobacco Use History[1] E-Cigarettes   Vaping Use Never User    Start Date     Cartridges/Day     Quit Date     Social History   Substance and Sexual Activity  Drug Use Yes   Types: Crack cocaine         Allergies[2]  Home Medications   FUROSEMIDE  (LASIX ) 40 MG TABLET    Take one tablet (40 mg dose) by mouth 2 (two) times daily.    Primary Survey  Primary Survey  Review of Systems   Review of Systems  Physical Exam   ED Triage Vitals [03/13/24 2029]  BP (!) 139/99  Heart Rate 100  Resp 18  SpO2 96 %  Temp 98.8 F (37.1 C)    Physical Exam   ED Course   Lab results: No data to display  Imaging: No data to display   ECG: ECG Results   None                                                                        Pre-Sedation Procedures    Medical Decision Making Labs:  CBC AND DIFFERENTIAL - Abnormal    WBC                           7.9                   RBC                           4.94                  HGB                           14.5                  HCT                           44.2                  MCV  89.5                   MCH                           29.4                  MCHC                          32.8                  Plt Ct                        327                   RDW SD                        50.5 (*)              MPV                           8.7 (*)               NRBC%                         0.0                   Absolute NRBC Count           0.00                  NEUTROPHIL %                  53.7                  LYMPHOCYTE %                  37.1                  MONOCYTE %                    7.1                   Eosinophil %                  1.0                   BASOPHIL %                    0.5                   IG%                           0.6                   ABSOLUTE NEUTROPHIL COUNT     4.24                  ABSOLUTE LYMPHOCYTE COUNT     2.93                  Absolute Monocyte  Count       0.56                  Absolute Eosinophil Count     0.08                  Absolute Basophil Count       0.04                  Absolute Immature Granulocyte Count   0.05 (*)           COMPREHENSIVE METABOLIC PANEL - Abnormal    Na                            144                   Potassium                     3.8                   Cl                            107                   CO2                           25                    AGAP                          12                    Glucose                       98                    BUN                           17                    Creatinine                    0.74 (*)              Ca                            8.8                   ALK PHOS                      69                    T Bili                        0.2                   Total Protein  6.6                   Alb                           3.9                   GLOBULIN                      2.7                   ALBUMIN /GLOBULIN RATIO        1.4                   BUN/CREAT RATIO               23                    ALT                            17                    AST                           12                    eGFR                          115                                       Normal GFR (glomerular filtration rate) > 60 mL/min/1.* SALICYLATE LEVEL - Abnormal    Salicylate                    <3 (*)             ACETAMINOPHEN  LEVEL - Abnormal    Acetaminophen                  <5.0 (*)           ETHANOL - Normal    Ethanol                       <10.00                                    Blood Alcohol Level is for Medical Purposes Only. URINALYSIS ONLY - NO SYMPTOMS URINE DRUGS OF ABUSE SCRN  Imaging: No results found for this visit on 03/13/24.  ECG: No results found for this visit on 03/13/24.  Patient left without being seen. Test results that were initiated for this visit have been reviewed: No critical values noted on diagnostic testing..     Amount and/or Complexity of Data Reviewed Labs: ordered.          Provider Communication  New Prescriptions   No medications on file    Modified Medications   No medications on file    Discontinued Medications   No medications on file    Clinical  Impression Final diagnoses:  None    ED Disposition     None                 Electronically signed by:       [1] Social History Tobacco Use  Smoking Status Every Day   Types: Cigars  Smokeless Tobacco Never  [2] No Known Allergies  Shawn GORMAN Desanctis, MD 03/14/24 [phone]  "

## 2024-03-13 NOTE — Telephone Encounter (Signed)
 Pharmacy Patient Advocate Encounter   Received notification from CoverMyMeds that prior authorization for Wegovy  1mg /0.60ml is required/requested.   Insurance verification completed.   The patient is insured through [employer] MEDICAID.   Per test claim: PA required; PA submitted to above mentioned insurance via Fax Key/confirmation #/EOC * Status is pending   Fax# [phone] Pharmacy PA call center # [phone]

## 2024-03-14 NOTE — ED Provider Notes (Signed)
 " [facility]ALTH [facility]  ED Provider Note  Shawn Ramsey 44 y.o. male DOB: 11/26/1980 MRN: [id number] History   Chief Complaint  Patient presents with   Shortness of Breath   Flank Pain   Detox Evaluation    Pt presents with multiple complaints.  Pt c/o flank pain since yesterday, SHOB x few days and is requesting detox from drugs and alcohol   44 year old male with prior medical history as detailed below presents for evaluation.  Patient with longstanding, well-documented history of polysubstance abuse.  Patient was triaged at [facility] earlier this evening for detox evaluation.  Apparently he left prior to full evaluation.  Charge nurse at this facility received a phone call from this patient earlier this evening asking if [facility] ED provided inpatient detox.  Patient now presents with initial complaint of recovering from pneumonia.  He also complains of right hip pain.  Patient reports that he finished the course of antibiotics - doxycycline  (?) -2 days ago.  He did not report interest or concern regarding detox until I asked why he had left [place] to come to [place].  He reports that the wait at [facility] was too long.  He decided to come see  whether he can get some help here in [place].  He denies fever.  He denies nausea or vomiting.  He denies chest pain.  He denies cough.  He appears to be in no distress.  He is not inebriated.  Chart review suggest that he was most likely on Augmentin  and azithromycin  versus doxycycline .  Chart review also suggest that he has been given outpatient resources for polysubstance abuse treatment on multiple occasions.  When I discussed whether or not he knew of outpatient resources for polysubstance abuse treatment he confirmed that he knew of these resources.   History provided by:  Patient and medical records Flank Pain Detox Evaluation       Past Medical History:  Diagnosis Date    COPD (chronic obstructive pulmonary disease) (*)     No past surgical history on file.  Social History   Substance and Sexual Activity  Alcohol Use Yes   Comment: drinks every other day wine and liquor   Tobacco Use History[1] E-Cigarettes   Vaping Use Never User    Start Date     Cartridges/Day     Quit Date     Social History   Substance and Sexual Activity  Drug Use Yes   Types: Crack cocaine         Allergies[2]  Home Medications   FUROSEMIDE  (LASIX ) 40 MG TABLET    Take one tablet (40 mg dose) by mouth 2 (two) times daily.    Primary Survey   Exposure    No visible abdominal trauma.  No visible trauma noted on back exam.      Review of Systems   Review of Systems  Genitourinary:  Positive for flank pain.  All other systems reviewed and are negative.   Physical Exam   ED Triage Vitals [03/14/24 0439]  BP 110/69  Heart Rate 91  Resp 20  SpO2 97 %  Temp 97.7 F (36.5 C)    Physical Exam  Nursing note and vitals reviewed. Constitutional: He appears well-developed and well-nourished.  Alert, oriented, speaking in full sentences, no respiratory distress, no apparent distress noted.  He is not intoxicated or inebriated  HENT:  Head: Normocephalic and atraumatic.  Eyes: EOM are intact. Pupils are equal, round, and reactive  to light.  Neck: Normal range of motion. Neck supple.  Trach in place  Cardiovascular: Normal rate and regular rhythm.  Pulmonary/Chest: Respiratory effort normal and breath sounds normal.  Abdominal: Soft. Bowel sounds are normal. No visible abdominal trauma.  Musculoskeletal: Normal range of motion. No visible trauma noted on back exam.     Cervical back: Normal range of motion and neck supple.   Neurological: He is alert and oriented to person, place, and time.  Skin: Skin is warm. Skin is dry.     ED Course   Lab results: No data to display  Imaging: No data to display   ECG: ECG Results    None                                                                        Pre-Sedation Procedures    Medical Decision Making Patient extensively presented with complaint of recovering from pneumonia.  However when pressed, it appears that he initially presented at [facility] earlier this evening with a request for outpatient detox assistance.  He has been provided resources for outpatient detox on multiple prior occasions.  He is well aware of the available outpatient resources.  He is not inebriated or intoxicated.  He is without any evidence of distress.  His pulse ox is 97 to 98%.  His lung sounds are clear.  His other vitals obtained are without acute abnormality.  He is without indication for emergent workup at this time.  He is already aware of the outpatient resources available to him for substance abuse rehab/treatment.  Patient offered nonnarcotic medication for his chronic right hip pain.  He declines Toradol  injection.  He is okay with a p.o. dose of Tylenol .  Importance of close follow-up stressed.  Strict return precautions given and understood.  Risk OTC drugs.          Provider Communication  New Prescriptions   No medications on file    Modified Medications   No medications on file    Discontinued Medications   No medications on file    Clinical Impression Final diagnoses:  Substance abuse (*)    ED Disposition     ED Disposition  Discharge   Condition  Stable   Comment  --                   Electronically signed by:       [1] Social History Tobacco Use  Smoking Status Every Day   Types: Cigars  Smokeless Tobacco Never  [2] No Known Allergies  Maude Johnetta Galloway, MD 03/14/24 [phone]  "

## 2024-03-15 ENCOUNTER — Emergency Department (HOSPITAL_COMMUNITY)

## 2024-03-15 ENCOUNTER — Emergency Department (HOSPITAL_COMMUNITY)
Admission: EM | Admit: 2024-03-15 | Discharge: 2024-03-15 | Disposition: A | Discharge status: Home / Self Care | Attending: Emergency Medicine | Admitting: Emergency Medicine

## 2024-03-15 DIAGNOSIS — E119 Type 2 diabetes mellitus without complications: Secondary | ICD-10-CM | POA: Insufficient documentation

## 2024-03-15 DIAGNOSIS — J449 Chronic obstructive pulmonary disease, unspecified: Secondary | ICD-10-CM | POA: Insufficient documentation

## 2024-03-15 DIAGNOSIS — M25551 Pain in right hip: Secondary | ICD-10-CM | POA: Insufficient documentation

## 2024-03-15 MED ORDER — NAPROXEN 500 MG PO TABS: 500.0000 mg | ORAL_TABLET | Freq: Two times a day (BID) | ORAL | 0 refills | Status: AC

## 2024-03-15 MED ORDER — KETOROLAC TROMETHAMINE 15 MG/ML IJ SOLN
15.0000 mg | Freq: Once | INTRAMUSCULAR | Status: AC
  Administered 2024-03-15: 15 mg via INTRAMUSCULAR
  Filled 2024-03-15: qty 1

## 2024-03-15 MED ORDER — LIDOCAINE 5 % EX PTCH
1.0000 | MEDICATED_PATCH | Freq: Once | CUTANEOUS | Status: DC
  Administered 2024-03-15: 1 via TRANSDERMAL
  Filled 2024-03-15: qty 1
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
## 2024-03-15 NOTE — ED Triage Notes (Signed)
 Pt reports sharp pain in right leg that started a few days ago, pain continues to increase. Taken OTC meds without relief.

## 2024-03-15 NOTE — ED Provider Notes (Incomplete)
 Patient here presenting with pain in his back and hip.  Pain is worse with certain movements and twisting.  X-ray shows evidence of arthritis but low suspicion for septic arthritis or fracture.  Discussed the findings with the patient.  At this time feel that he is stable for discharge home and can follow-up with his PCP for physical therapy but also orthopedics if he continues to have issues

## 2024-03-15 NOTE — Discharge Instructions (Signed)
 Thank you for letting us  take care of you today.  You came in today for evaluation of right hip pain.  We did an x-ray that showed evidence of arthritis in your right hip.  We think this was causing her symptoms.  Please follow-up with your primary care doctor for further evaluation.  We will send you home with naproxen  which is an anti-inflammatory medication to use for the next 1 to 2 weeks.

## 2024-03-15 NOTE — ED Provider Notes (Signed)
 " [facility] Provider Note   CSN: [id number] Arrival date & time: 03/15/24  1452     Patient presents with: No chief complaint on file.   Shawn Ramsey is a 44 y.o. male with a past medical history notable for COPD, diabetes, OSA who presents today for evaluation of right hip pain.  Patient reports that for the past week he has had worsening pain in his right hip and denies any peripheral weakness or numbness.  No recent trauma.  Due to ongoing symptoms presenting today for further evaluation.  HPI     Prior to Admission medications  Medication Sig Start Date End Date Taking? Authorizing Provider  albuterol  (VENTOLIN  HFA) 108 (90 Base) MCG/ACT inhaler Inhale 1-2 puffs into the lungs every 6 (six) hours as needed for wheezing or shortness of breath. 10/19/23   Antonetta Rollene BRAVO, MD  amoxicillin -clavulanate (AUGMENTIN ) 875-125 MG tablet Take 1 tablet by mouth every 12 (twelve) hours. Patient not taking: Reported on 03/05/2024 02/19/24   Kammerer, Megan L, DO  atorvastatin  (LIPITOR) 40 MG tablet Take 1 tablet by mouth once daily 02/05/24   Simpson, Margaret E, MD  Cholecalciferol (VITAMIN D3 PO) Take 1 tablet by mouth daily.    [provider]  fluticasone  (FLONASE ) 50 MCG/ACT nasal spray Place 2 sprays into both nostrils daily. 03/05/24   Antonetta Rollene BRAVO, MD  montelukast  (SINGULAIR ) 10 MG tablet Take 1 tablet (10 mg total) by mouth at bedtime. 03/05/24   Antonetta Rollene BRAVO, MD  potassium chloride  SA (KLOR-CON  M) 20 MEQ tablet Take 1 tablet (20 mEq total) by mouth 2 (two) times daily. 06/28/23   Antonetta Rollene BRAVO, MD  PULMICORT  0.25 MG/2ML nebulizer solution Take 2 mLs (0.25 mg total) by nebulization in the morning and at bedtime. 04/22/21   Sood, Vineet, MD  sertraline  (ZOLOFT ) 100 MG tablet Take 2 tablets (200 mg total) by mouth daily. 06/28/23   Antonetta Rollene BRAVO, MD  tadalafil  (CIALIS ) 20 MG tablet TAKE 1/2 TO 1 (ONE-HALF TO ONE)  TABLET BY MOUTH EVERY OTHER DAY AS NEEDED FOR ERECTILE DYSFUNCTION Patient taking differently: Take 10-20 mg by mouth as directed. Take every other day as needed for erectile dysfunction 06/26/23   Antonetta Rollene BRAVO, MD  torsemide  (DEMADEX ) 20 MG tablet Take 1 tablet (20 mg total) by mouth 2 (two) times daily. 01/11/24 04/10/24  Cyrena Mylar, MD  Vitamin D , Ergocalciferol , (DRISDOL ) 1.25 MG (50000 UNIT) CAPS capsule Take 1 capsule (50,000 Units total) by mouth every 7 (seven) days. 03/06/24   Antonetta Rollene BRAVO, MD  WEGOVY  1 MG/0.5ML SOAJ SQ injection Inject 1 mg into the skin once a week. Patient not taking: Reported on 03/05/2024 10/17/23   Antonetta Rollene BRAVO, MD    Allergies: Patient has no known allergies.    Review of Systems  Updated Vital Signs BP 138/77 (BP Location: Right Arm)   Pulse 81   Temp 97.9 F (36.6 C) (Oral)   Resp 20   SpO2 100%   Physical Exam  (all labs ordered are listed, but only abnormal results are displayed) Labs Reviewed - No data to display  EKG: None  Radiology: DG Hip Unilat With Pelvis 2-3 Views Right Result Date: 03/15/2024 CLINICAL DATA:  Right hip pain after injury EXAM: DG HIP (WITH OR WITHOUT PELVIS) 2-3V RIGHT COMPARISON:  None Available. FINDINGS: There is no evidence of hip fracture or dislocation. Mild narrowing of right hip joint is noted with mild osteophyte  formation. IMPRESSION: Mild degenerative joint disease of right hip. No acute abnormality seen. Electronically Signed   By: Lynwood Landy Raddle M.D.   On: 03/15/2024 15:49    Procedures   Medications Ordered in the ED - No data to display                              Medical Decision Making Risk Prescription drug management.   Patient is a 44 year old male who presents today for evaluation of right hip pain.  On initial assessment patient was noted to be hemodynamically stable and afebrile.  On bedside assessment patient was noted resting comfortable without acute distress.   Physical examination with tenderness to palpation to the right hip.  Lower extremity without any additional evidence of trauma.  Patient is warm and well-perfused with no evidence of lower extremity edema.  Patient had an x-ray done here in the emergency department that showed degenerative changes of his right hip.  I suspect that his symptoms may be related to arthritis.  There is no evidence on my examination or based on clinical history for septic arthritis and no imaging findings concerning for dislocation or fracture.  Exam and history also not consistent with acute limb ischemia, acute aortic pathology, shingles.  Will provide Toradol  here as well as lidocaine  patch.  Will send home with a course of naproxen .  Return precautions discussed.  Patient was discharged in stable condition.    Final diagnoses:  Pain of right hip    ED Discharge Orders     None          Laurita Sieving, MD 03/15/24 2249    Doretha Folks, MD 03/16/24 1318  "

## 2024-03-18 NOTE — Telephone Encounter (Signed)
 Pharmacy Patient Advocate Encounter  Received notification from  [employer]ICA[employer] that Prior Authorization for Wegovy  1mg /0.27ml has been APPROVED from 03/13/24 to 03/08/25   PA #/Case ID/Reference #: [id number]  Call reference: [id number]

## 2024-03-22 ENCOUNTER — Other Ambulatory Visit: Payer: Self-pay | Admitting: Family Medicine

## 2024-04-08 ENCOUNTER — Ambulatory Visit (HOSPITAL_COMMUNITY): Admitting: Registered Nurse

## 2024-07-02 ENCOUNTER — Ambulatory Visit: Payer: Self-pay | Admitting: Family Medicine
